# Patient Record
Sex: Male | Born: 1955 | Race: Black or African American | Hispanic: No | Marital: Single | Smoking: Former smoker
Health system: Southern US, Community
[De-identification: ages and names within clinical notes are randomized; demographics above are authoritative.]

## PROBLEM LIST (undated history)

## (undated) DIAGNOSIS — K56609 Unspecified intestinal obstruction, unspecified as to partial versus complete obstruction: Secondary | ICD-10-CM

## (undated) DIAGNOSIS — J939 Pneumothorax, unspecified: Secondary | ICD-10-CM

## (undated) HISTORY — PX: COLOSTOMY: SHX63

---

## 1998-10-08 ENCOUNTER — Emergency Department (HOSPITAL_COMMUNITY): Admission: EM | Admit: 1998-10-08 | Discharge: 1998-10-08 | Payer: Self-pay | Admitting: Internal Medicine

## 2016-04-17 ENCOUNTER — Emergency Department (HOSPITAL_COMMUNITY): Payer: Non-veteran care | Admitting: Anesthesiology

## 2016-04-17 ENCOUNTER — Inpatient Hospital Stay (HOSPITAL_COMMUNITY)
Admission: EM | Admit: 2016-04-17 | Discharge: 2016-04-21 | DRG: 580 | Disposition: A | Discharge status: Home / Self Care | Payer: Non-veteran care | Attending: Orthopedic Surgery | Admitting: Orthopedic Surgery

## 2016-04-17 ENCOUNTER — Emergency Department (HOSPITAL_COMMUNITY): Payer: Non-veteran care

## 2016-04-17 ENCOUNTER — Encounter (HOSPITAL_COMMUNITY): Payer: Self-pay | Admitting: Emergency Medicine

## 2016-04-17 ENCOUNTER — Encounter (HOSPITAL_COMMUNITY)
Admission: EM | Disposition: A | Discharge status: Home / Self Care | Payer: Self-pay | Source: Home / Self Care | Attending: Orthopedic Surgery

## 2016-04-17 DIAGNOSIS — M79641 Pain in right hand: Secondary | ICD-10-CM

## 2016-04-17 DIAGNOSIS — Z87891 Personal history of nicotine dependence: Secondary | ICD-10-CM | POA: Diagnosis not present

## 2016-04-17 DIAGNOSIS — L02511 Cutaneous abscess of right hand: Secondary | ICD-10-CM | POA: Diagnosis present

## 2016-04-17 DIAGNOSIS — IMO0001 Reserved for inherently not codable concepts without codable children: Secondary | ICD-10-CM | POA: Diagnosis present

## 2016-04-17 DIAGNOSIS — M65142 Other infective (teno)synovitis, left hand: Secondary | ICD-10-CM | POA: Diagnosis present

## 2016-04-17 DIAGNOSIS — R21 Rash and other nonspecific skin eruption: Secondary | ICD-10-CM | POA: Diagnosis present

## 2016-04-17 DIAGNOSIS — L03113 Cellulitis of right upper limb: Secondary | ICD-10-CM

## 2016-04-17 HISTORY — PX: I & D EXTREMITY: SHX5045

## 2016-04-17 LAB — COMPREHENSIVE METABOLIC PANEL
ALT: 14 U/L — AB (ref 17–63)
AST: 15 U/L (ref 15–41)
Albumin: 3.8 g/dL (ref 3.5–5.0)
Alkaline Phosphatase: 72 U/L (ref 38–126)
Anion gap: 7 (ref 5–15)
BUN: 15 mg/dL (ref 6–20)
CHLORIDE: 102 mmol/L (ref 101–111)
CO2: 25 mmol/L (ref 22–32)
CREATININE: 1.28 mg/dL — AB (ref 0.61–1.24)
Calcium: 9.1 mg/dL (ref 8.9–10.3)
GFR, EST NON AFRICAN AMERICAN: 58 mL/min — AB (ref >60)
Glucose, Bld: 110 mg/dL — ABNORMAL HIGH (ref 65–99)
Potassium: 4 mmol/L (ref 3.5–5.1)
Sodium: 134 mmol/L — ABNORMAL LOW (ref 135–145)
TOTAL PROTEIN: 7.4 g/dL (ref 6.5–8.1)
Total Bilirubin: 0.5 mg/dL (ref 0.3–1.2)

## 2016-04-17 LAB — CBC WITH DIFFERENTIAL/PLATELET
Basophils Absolute: 0 10*3/uL (ref 0.0–0.1)
Basophils Relative: 0 %
Eosinophils Absolute: 0.1 10*3/uL (ref 0.0–0.7)
Eosinophils Relative: 1 %
HEMATOCRIT: 43.7 % (ref 39.0–52.0)
Hemoglobin: 14.5 g/dL (ref 13.0–17.0)
LYMPHS ABS: 2.2 10*3/uL (ref 0.7–4.0)
Lymphocytes Relative: 16 %
MCH: 31.7 pg (ref 26.0–34.0)
MCHC: 33.2 g/dL (ref 30.0–36.0)
MCV: 95.6 fL (ref 78.0–100.0)
MONO ABS: 1.4 10*3/uL — AB (ref 0.1–1.0)
MONOS PCT: 10 %
Neutro Abs: 9.9 10*3/uL — ABNORMAL HIGH (ref 1.7–7.7)
Neutrophils Relative %: 73 %
PLATELETS: 254 10*3/uL (ref 150–400)
RBC: 4.57 MIL/uL (ref 4.22–5.81)
RDW: 12.2 % (ref 11.5–15.5)
WBC: 13.6 10*3/uL — ABNORMAL HIGH (ref 4.0–10.5)

## 2016-04-17 LAB — SEDIMENTATION RATE: SED RATE: 18 mm/h — AB (ref 0–16)

## 2016-04-17 LAB — C-REACTIVE PROTEIN: CRP: 6.7 mg/dL — AB (ref <1.0)

## 2016-04-17 LAB — I-STAT CG4 LACTIC ACID, ED: LACTIC ACID, VENOUS: 1.13 mmol/L (ref 0.5–1.9)

## 2016-04-17 SURGERY — IRRIGATION AND DEBRIDEMENT EXTREMITY
Anesthesia: General | Site: Hand | Laterality: Right

## 2016-04-17 MED ORDER — SUCCINYLCHOLINE CHLORIDE 20 MG/ML IJ SOLN
INTRAMUSCULAR | Status: DC | PRN
  Administered 2016-04-17: 120 mg via INTRAVENOUS

## 2016-04-17 MED ORDER — DIPHENHYDRAMINE HCL 25 MG PO CAPS
25.0000 mg | ORAL_CAPSULE | Freq: Four times a day (QID) | ORAL | Status: DC | PRN
  Administered 2016-04-19: 25 mg via ORAL
  Administered 2016-04-19 – 2016-04-20 (×2): 50 mg via ORAL
  Administered 2016-04-20 (×2): 25 mg via ORAL
  Filled 2016-04-17: qty 2
  Filled 2016-04-17: qty 1
  Filled 2016-04-17: qty 2
  Filled 2016-04-17 (×2): qty 1

## 2016-04-17 MED ORDER — BISACODYL 10 MG RE SUPP: 10.0000 mg | Freq: Every day | RECTAL | Status: DC | PRN

## 2016-04-17 MED ORDER — ONDANSETRON HCL 4 MG PO TABS: 4.0000 mg | ORAL_TABLET | Freq: Four times a day (QID) | ORAL | Status: DC | PRN

## 2016-04-17 MED ORDER — LIDOCAINE HCL (CARDIAC) 20 MG/ML IV SOLN
INTRAVENOUS | Status: DC | PRN
  Administered 2016-04-17: 100 mg via INTRAVENOUS

## 2016-04-17 MED ORDER — FENTANYL CITRATE (PF) 100 MCG/2ML IJ SOLN
INTRAMUSCULAR | Status: AC
  Filled 2016-04-17: qty 2

## 2016-04-17 MED ORDER — ONDANSETRON HCL 4 MG/2ML IJ SOLN
INTRAMUSCULAR | Status: DC | PRN
  Administered 2016-04-17: 4 mg via INTRAVENOUS

## 2016-04-17 MED ORDER — MIDAZOLAM HCL 2 MG/2ML IJ SOLN
INTRAMUSCULAR | Status: AC
  Filled 2016-04-17: qty 2

## 2016-04-17 MED ORDER — DOCUSATE SODIUM 100 MG PO CAPS
100.0000 mg | ORAL_CAPSULE | Freq: Two times a day (BID) | ORAL | Status: DC
  Administered 2016-04-17 – 2016-04-21 (×7): 100 mg via ORAL
  Filled 2016-04-17 (×7): qty 1

## 2016-04-17 MED ORDER — MORPHINE SULFATE (PF) 4 MG/ML IV SOLN
4.0000 mg | Freq: Once | INTRAVENOUS | Status: AC
  Administered 2016-04-17: 4 mg via INTRAVENOUS
  Filled 2016-04-17: qty 1

## 2016-04-17 MED ORDER — PROMETHAZINE HCL 25 MG RE SUPP: 12.5000 mg | Freq: Four times a day (QID) | RECTAL | Status: DC | PRN

## 2016-04-17 MED ORDER — PIPERACILLIN-TAZOBACTAM 3.375 G IVPB 30 MIN
3.3750 g | Freq: Once | INTRAVENOUS | Status: AC
  Administered 2016-04-17: 3.375 g via INTRAVENOUS
  Filled 2016-04-17: qty 50

## 2016-04-17 MED ORDER — METHOCARBAMOL 1000 MG/10ML IJ SOLN
500.0000 mg | Freq: Four times a day (QID) | INTRAVENOUS | Status: DC | PRN
  Filled 2016-04-17: qty 5

## 2016-04-17 MED ORDER — PROPOFOL 10 MG/ML IV BOLUS
INTRAVENOUS | Status: AC
  Filled 2016-04-17: qty 40

## 2016-04-17 MED ORDER — PROPOFOL 10 MG/ML IV BOLUS
INTRAVENOUS | Status: DC | PRN
  Administered 2016-04-17: 200 mg via INTRAVENOUS
  Administered 2016-04-17: 40 mg via INTRAVENOUS

## 2016-04-17 MED ORDER — POLYETHYLENE GLYCOL 3350 17 G PO PACK
17.0000 g | PACK | Freq: Every day | ORAL | Status: DC | PRN
  Administered 2016-04-19: 17 g via ORAL
  Filled 2016-04-17: qty 1

## 2016-04-17 MED ORDER — BACITRACIN ZINC 500 UNIT/GM EX OINT
TOPICAL_OINTMENT | CUTANEOUS | Status: DC | PRN
  Administered 2016-04-17: 1 via TOPICAL

## 2016-04-17 MED ORDER — VANCOMYCIN HCL 1000 MG IV SOLR
INTRAVENOUS | Status: DC | PRN
  Administered 2016-04-17: 1000 mg via INTRAVENOUS

## 2016-04-17 MED ORDER — HYDROMORPHONE HCL 1 MG/ML IJ SOLN: 0.2500 mg | INTRAMUSCULAR | Status: DC | PRN

## 2016-04-17 MED ORDER — VANCOMYCIN HCL 10 G IV SOLR
1500.0000 mg | Freq: Once | INTRAVENOUS | Status: DC
  Filled 2016-04-17: qty 1500

## 2016-04-17 MED ORDER — BACITRACIN-NEOMYCIN-POLYMYXIN 400-5-5000 EX OINT
TOPICAL_OINTMENT | CUTANEOUS | Status: AC
  Filled 2016-04-17: qty 1

## 2016-04-17 MED ORDER — METHOCARBAMOL 500 MG PO TABS
500.0000 mg | ORAL_TABLET | Freq: Four times a day (QID) | ORAL | Status: DC | PRN
  Administered 2016-04-17 – 2016-04-20 (×3): 500 mg via ORAL
  Filled 2016-04-17 (×3): qty 1

## 2016-04-17 MED ORDER — SODIUM CHLORIDE 0.9 % IR SOLN
Status: DC | PRN
  Administered 2016-04-17: 3000 mL

## 2016-04-17 MED ORDER — SENNA 8.6 MG PO TABS
1.0000 | ORAL_TABLET | Freq: Two times a day (BID) | ORAL | Status: DC
  Administered 2016-04-17 – 2016-04-21 (×7): 8.6 mg via ORAL
  Filled 2016-04-17 (×7): qty 1

## 2016-04-17 MED ORDER — MAGNESIUM CITRATE PO SOLN
1.0000 | Freq: Once | ORAL | Status: AC | PRN
  Administered 2016-04-21: 1 via ORAL
  Filled 2016-04-17: qty 296

## 2016-04-17 MED ORDER — LACTATED RINGERS IV SOLN
INTRAVENOUS | Status: DC | PRN
  Administered 2016-04-17: 21:00:00 via INTRAVENOUS

## 2016-04-17 MED ORDER — POTASSIUM CHLORIDE IN NACL 20-0.45 MEQ/L-% IV SOLN
INTRAVENOUS | Status: DC
  Administered 2016-04-17 – 2016-04-19 (×2): via INTRAVENOUS
  Administered 2016-04-21: 1 mL via INTRAVENOUS
  Filled 2016-04-17 (×6): qty 1000

## 2016-04-17 MED ORDER — 0.9 % SODIUM CHLORIDE (POUR BTL) OPTIME
TOPICAL | Status: DC | PRN
  Administered 2016-04-17: 1000 mL

## 2016-04-17 MED ORDER — FAMOTIDINE 20 MG PO TABS
20.0000 mg | ORAL_TABLET | Freq: Two times a day (BID) | ORAL | Status: DC | PRN
Start: 2016-04-17 — End: 2016-04-21

## 2016-04-17 MED ORDER — BUPIVACAINE HCL (PF) 0.25 % IJ SOLN
INTRAMUSCULAR | Status: AC
  Filled 2016-04-17: qty 30

## 2016-04-17 MED ORDER — MIDAZOLAM HCL 5 MG/5ML IJ SOLN
INTRAMUSCULAR | Status: DC | PRN
  Administered 2016-04-17 (×2): 1 mg via INTRAVENOUS

## 2016-04-17 MED ORDER — VANCOMYCIN HCL IN DEXTROSE 750-5 MG/150ML-% IV SOLN
750.0000 mg | Freq: Two times a day (BID) | INTRAVENOUS | Status: DC
  Administered 2016-04-18 – 2016-04-21 (×6): 750 mg via INTRAVENOUS
  Filled 2016-04-17 (×8): qty 150

## 2016-04-17 MED ORDER — VANCOMYCIN HCL IN DEXTROSE 1-5 GM/200ML-% IV SOLN
INTRAVENOUS | Status: AC
  Filled 2016-04-17: qty 200

## 2016-04-17 MED ORDER — MORPHINE SULFATE (PF) 2 MG/ML IV SOLN: 2.0000 mg | INTRAVENOUS | Status: DC | PRN

## 2016-04-17 MED ORDER — ALPRAZOLAM 0.5 MG PO TABS
0.5000 mg | ORAL_TABLET | Freq: Four times a day (QID) | ORAL | Status: DC | PRN
  Administered 2016-04-19 – 2016-04-20 (×3): 0.5 mg via ORAL
  Filled 2016-04-17 (×3): qty 1

## 2016-04-17 MED ORDER — VITAMIN C 500 MG PO TABS
1000.0000 mg | ORAL_TABLET | Freq: Every day | ORAL | Status: DC
  Administered 2016-04-18 – 2016-04-21 (×4): 1000 mg via ORAL
  Filled 2016-04-17 (×4): qty 2

## 2016-04-17 MED ORDER — OXYCODONE HCL 5 MG PO TABS
5.0000 mg | ORAL_TABLET | ORAL | Status: DC | PRN
  Administered 2016-04-17 – 2016-04-18 (×2): 10 mg via ORAL
  Administered 2016-04-18: 5 mg via ORAL
  Administered 2016-04-19 – 2016-04-20 (×2): 10 mg via ORAL
  Filled 2016-04-17 (×2): qty 2
  Filled 2016-04-17: qty 1
  Filled 2016-04-17: qty 2

## 2016-04-17 MED ORDER — OXYCODONE HCL 5 MG PO TABS
ORAL_TABLET | ORAL | Status: AC
  Filled 2016-04-17: qty 2

## 2016-04-17 MED ORDER — ONDANSETRON HCL 4 MG/2ML IJ SOLN: 4.0000 mg | Freq: Four times a day (QID) | INTRAMUSCULAR | Status: DC | PRN

## 2016-04-17 MED ORDER — PIPERACILLIN-TAZOBACTAM 3.375 G IVPB
3.3750 g | Freq: Three times a day (TID) | INTRAVENOUS | Status: DC
  Administered 2016-04-17 – 2016-04-21 (×11): 3.375 g via INTRAVENOUS
  Filled 2016-04-17 (×14): qty 50

## 2016-04-17 MED ORDER — FENTANYL CITRATE (PF) 100 MCG/2ML IJ SOLN
INTRAMUSCULAR | Status: DC | PRN
  Administered 2016-04-17: 25 ug via INTRAVENOUS
  Administered 2016-04-17: 50 ug via INTRAVENOUS
  Administered 2016-04-17: 25 ug via INTRAVENOUS
  Administered 2016-04-17: 100 ug via INTRAVENOUS

## 2016-04-17 SURGICAL SUPPLY — 49 items
BANDAGE ACE 4X5 VEL STRL LF (GAUZE/BANDAGES/DRESSINGS) ×3 IMPLANT
BANDAGE ELASTIC 4 VELCRO ST LF (GAUZE/BANDAGES/DRESSINGS) ×2 IMPLANT
BNDG CONFORM 2 STRL LF (GAUZE/BANDAGES/DRESSINGS) IMPLANT
BNDG GAUZE ELAST 4 BULKY (GAUZE/BANDAGES/DRESSINGS) ×5 IMPLANT
CORDS BIPOLAR (ELECTRODE) ×5 IMPLANT
CUFF TOURNIQUET SINGLE 18IN (TOURNIQUET CUFF) ×3 IMPLANT
CUFF TOURNIQUET SINGLE 24IN (TOURNIQUET CUFF) IMPLANT
DRESSING ADAPTIC 1/2  N-ADH (PACKING) ×2 IMPLANT
DRSG ADAPTIC 3X8 NADH LF (GAUZE/BANDAGES/DRESSINGS) ×3 IMPLANT
GAUZE SPONGE 4X4 12PLY STRL (GAUZE/BANDAGES/DRESSINGS) ×3 IMPLANT
GAUZE XEROFORM 1X8 LF (GAUZE/BANDAGES/DRESSINGS) ×3 IMPLANT
GLOVE BIOGEL M 8.0 STRL (GLOVE) ×3 IMPLANT
GLOVE BIOGEL PI IND STRL 7.5 (GLOVE) IMPLANT
GLOVE BIOGEL PI INDICATOR 7.5 (GLOVE) ×2
GLOVE SS BIOGEL STRL SZ 8 (GLOVE) ×1 IMPLANT
GLOVE SUPERSENSE BIOGEL SZ 8 (GLOVE) ×2
GLOVE SURG SS PI 7.5 STRL IVOR (GLOVE) ×2 IMPLANT
GOWN STRL REUS W/ TWL LRG LVL3 (GOWN DISPOSABLE) ×1 IMPLANT
GOWN STRL REUS W/ TWL XL LVL3 (GOWN DISPOSABLE) ×2 IMPLANT
GOWN STRL REUS W/TWL LRG LVL3 (GOWN DISPOSABLE) ×3
GOWN STRL REUS W/TWL XL LVL3 (GOWN DISPOSABLE) ×6
HANDPIECE INTERPULSE COAX TIP (DISPOSABLE)
KIT BASIN OR (CUSTOM PROCEDURE TRAY) ×3 IMPLANT
KIT ROOM TURNOVER OR (KITS) ×3 IMPLANT
LOOP VESSEL MAXI BLUE (MISCELLANEOUS) ×2 IMPLANT
MANIFOLD NEPTUNE II (INSTRUMENTS) ×3 IMPLANT
NDL HYPO 25GX1X1/2 BEV (NEEDLE) IMPLANT
NEEDLE HYPO 25GX1X1/2 BEV (NEEDLE) ×3 IMPLANT
NS IRRIG 1000ML POUR BTL (IV SOLUTION) ×3 IMPLANT
PACK ORTHO EXTREMITY (CUSTOM PROCEDURE TRAY) ×3 IMPLANT
PAD ARMBOARD 7.5X6 YLW CONV (MISCELLANEOUS) ×3 IMPLANT
PAD CAST 4YDX4 CTTN HI CHSV (CAST SUPPLIES) ×1 IMPLANT
PADDING CAST COTTON 4X4 STRL (CAST SUPPLIES) ×3
PILLOW ARM CARTER ADULT (MISCELLANEOUS) ×2 IMPLANT
SCRUB POVIDONE IODINE 4 OZ (MISCELLANEOUS) ×2 IMPLANT
SET CYSTO W/LG BORE CLAMP LF (SET/KITS/TRAYS/PACK) ×2 IMPLANT
SET HNDPC FAN SPRY TIP SCT (DISPOSABLE) IMPLANT
SOL PREP POV-IOD 4OZ 10% (MISCELLANEOUS) ×2 IMPLANT
SPONGE GAUZE 4X4 12PLY STER LF (GAUZE/BANDAGES/DRESSINGS) ×4 IMPLANT
SPONGE LAP 4X18 X RAY DECT (DISPOSABLE) ×3 IMPLANT
SWAB CULTURE ESWAB REG 1ML (MISCELLANEOUS) ×2 IMPLANT
SYR CONTROL 10ML LL (SYRINGE) ×2 IMPLANT
TOWEL OR 17X24 6PK STRL BLUE (TOWEL DISPOSABLE) ×3 IMPLANT
TOWEL OR 17X26 10 PK STRL BLUE (TOWEL DISPOSABLE) ×3 IMPLANT
TUBE ANAEROBIC SPECIMEN COL (MISCELLANEOUS) IMPLANT
TUBE CONNECTING 12'X1/4 (SUCTIONS) ×1
TUBE CONNECTING 12X1/4 (SUCTIONS) ×2 IMPLANT
WATER STERILE IRR 1000ML POUR (IV SOLUTION) ×3 IMPLANT
YANKAUER SUCT BULB TIP NO VENT (SUCTIONS) ×5 IMPLANT

## 2016-04-17 NOTE — Transfer of Care (Signed)
Immediate Anesthesia Transfer of Care Note  Patient: Caleb LaundryFrederick Earnest  Procedure(s) Performed: Procedure(s): IRRIGATION AND DEBRIDEMENT RIGHT HAND (Right)  Patient Location: PACU  Anesthesia Type:General  Level of Consciousness: awake and alert   Airway & Oxygen Therapy: Patient Spontanous Breathing and Patient connected to nasal cannula oxygen  Post-op Assessment: Report given to RN and Post -op Vital signs reviewed and stable  Post vital signs: Reviewed and stable  Last Vitals:  Vitals:   04/17/16 1915 04/17/16 1915  BP: 178/100 178/100  Pulse: 67 75  Resp:  16  Temp:      Last Pain:  Vitals:   04/17/16 2021  TempSrc:   PainSc: 0-No pain         Complications: No apparent anesthesia complications

## 2016-04-17 NOTE — Op Note (Signed)
See [id number]#[id number] Amanda PeaGramig MD

## 2016-04-17 NOTE — Anesthesia Procedure Notes (Signed)
Procedure Name: Intubation Date/Time: 04/17/2016 8:57 PM Performed by: Edmonia CaprioAUSTON, Manolo Bosket M Pre-anesthesia Checklist: Patient identified, Emergency Drugs available, Suction available, Timeout performed and Patient being monitored Patient Re-evaluated:Patient Re-evaluated prior to inductionOxygen Delivery Method: Circle system utilized Preoxygenation: Pre-oxygenation with 100% oxygen Intubation Type: IV induction, Rapid sequence and Cricoid Pressure applied Laryngoscope Size: Miller and 2 Grade View: Grade I Tube type: Oral Laser Tube: Cuffed inflated with minimal occlusive pressure - saline Tube size: 7.5 mm Number of attempts: 1 Airway Equipment and Method: Stylet Placement Confirmation: ETT inserted through vocal cords under direct vision,  positive ETCO2 and breath sounds checked- equal and bilateral Secured at: 23 cm Tube secured with: Tape Dental Injury: Teeth and Oropharynx as per pre-operative assessment

## 2016-04-17 NOTE — Progress Notes (Signed)
Received patient from PACU with ace wrap on right hand after I & D.  Patient AOx4, ambulatory, pain at 4/10 throbbing, BP elevated but trending down based from PACU BP data and O2Sat at 98% on 2L/min via [place].  Patient oriented to room, bed controls and call light. Started on a clear diet, patient voided and resting comfortably on bed.

## 2016-04-17 NOTE — ED Notes (Addendum)
Pt. transported to short stay room 36 , personal belongings bagged with pt. at bedside .

## 2016-04-17 NOTE — Anesthesia Preprocedure Evaluation (Addendum)
Anesthesia Evaluation  Patient identified by MRN, date of birth, ID band Patient awake    Reviewed: Allergy & Precautions, NPO status , Patient's Chart, lab work & pertinent test results  Airway Mallampati: I  TM Distance: >3 FB Neck ROM: Full    Dental  (+) Teeth Intact, Dental Advisory Given   Pulmonary neg pulmonary ROS, former smoker,    Pulmonary exam normal        Cardiovascular negative cardio ROS   Rhythm:Regular Rate:Normal     Neuro/Psych    GI/Hepatic Neg liver ROS,   Endo/Other  negative endocrine ROS  Renal/GU negative Renal ROS     Musculoskeletal   Abdominal   Peds  Hematology   Anesthesia Other Findings   Reproductive/Obstetrics                            Anesthesia Physical Anesthesia Plan  ASA: I and emergent  Anesthesia Plan: General   Post-op Pain Management:    Induction: Intravenous, Rapid sequence and Cricoid pressure planned  Airway Management Planned: Oral ETT  Additional Equipment:   Intra-op Plan:   Post-operative Plan: Extubation in OR  Informed Consent: I have reviewed the patients History and Physical, chart, labs and discussed the procedure including the risks, benefits and alternatives for the proposed anesthesia with the patient or authorized representative who has indicated his/her understanding and acceptance.   Dental advisory given  Plan Discussed with: CRNA and Anesthesiologist  Anesthesia Plan Comments:         Anesthesia Quick Evaluation

## 2016-04-17 NOTE — ED Notes (Signed)
Patient transported to X-ray 

## 2016-04-17 NOTE — ED Provider Notes (Signed)
[facility] DEPT Provider Note   CSN: [id number] Arrival date & time: 04/17/16  1419   History   Chief Complaint Chief Complaint  Patient presents with  . Hand Pain  . Rash    HPI Caleb Singh is a 60 y.o. male.  The history is provided by the patient.   60 year old male with no significant past medical history presenting with finger and hand pain. Onset was 2 days ago. Context-patient states he was working in the garden and thinks he may have been bitten by something or injured it, but he does not remember specific event. Pain is worsening since then. Now severe. Located in right middle digit. Extending up the hand. Described as tightness. Associated with swelling, increased warmth. Pain is worse with movement or palpation. Denies associated fevers, weakness, numbness. Right hand dominant. Has a remote nonoperative distal ulnar fracture in the right wrist and a remote right dorsum of hand deep lac injury that was repaired, no other surgeries or prior injuries to affected extremity. No hx of gout, pseudogout, or any similar prior episodes in other joints.    History reviewed. No pertinent past medical history.  Patient Active Problem List   Diagnosis Date Noted  . Abscess of third finger of right hand 04/17/2016    History reviewed. No pertinent surgical history.     Home Medications    Prior to Admission medications   Not on File    Family History History reviewed. No pertinent family history.  Social History Social History  Substance Use Topics  . Smoking status: Former Research scientist (life sciences)  . Smokeless tobacco: Never Used  . Alcohol use No     Allergies   Review of patient's allergies indicates no known allergies.   Review of Systems Review of Systems  Constitutional: Negative for chills and fever.  Respiratory: Negative for shortness of breath.   Cardiovascular: Negative for chest pain.  Gastrointestinal: Negative for abdominal pain and vomiting.    Musculoskeletal: Positive for arthralgias and myalgias.  Skin: Positive for color change and wound.  Allergic/Immunologic: Negative for immunocompromised state.  Neurological: Negative for weakness and numbness.  Hematological: Does not bruise/bleed easily.  All other systems reviewed and are negative.    Physical Exam Updated Vital Signs BP (!) 171/88 (BP Location: Left Arm)   Pulse 71   Temp 100 F (37.8 C) (Oral)   Resp 18   Ht _0  (1.778 m)   Wt 77.1 kg   SpO2 98%   BMI 24.39 kg/m   Physical Exam  Constitutional: He appears well-developed and well-nourished.  HENT:  Head: Normocephalic and atraumatic.  Eyes: Conjunctivae are normal.  Neck: Neck supple.  Cardiovascular: Normal rate and regular rhythm.   No murmur heard. Pulmonary/Chest: Effort normal and breath sounds normal. No respiratory distress.  Abdominal: Soft. There is no tenderness.  Musculoskeletal: He exhibits edema and tenderness.  R middle digit held slightly in flexion, with TTP throughout digit. Small possible soft tissue wound vs superficial scab to dorsum of digit. Edema and erythema throughout digit and extending up dorsum of hand, without clear wrist involvement and no large wrist effusion. Small area of fluctuance within the digit. Brisk cap refill, intact flex and ext at MCP / PIP / DIP. 2+ radial pulse.   Neurological: He is alert.  Skin: Skin is warm and dry.  Psychiatric: He has a normal mood and affect.  Nursing note and vitals reviewed.    ED Treatments / Results  Labs (all labs ordered are  listed, but only abnormal results are displayed) Labs Reviewed  COMPREHENSIVE METABOLIC PANEL - Abnormal; Notable for the following:       Result Value   Sodium 134 (*)    Glucose, Bld 110 (*)    Creatinine, Ser 1.28 (*)    ALT 14 (*)    GFR calc non Af Amer 58 (*)    All other components within normal limits  CBC WITH DIFFERENTIAL/PLATELET - Abnormal; Notable for the following:    WBC 13.6  (*)    Neutro Abs 9.9 (*)    Monocytes Absolute 1.4 (*)    All other components within normal limits  SEDIMENTATION RATE - Abnormal; Notable for the following:    Sed Rate 18 (*)    All other components within normal limits  C-REACTIVE PROTEIN - Abnormal; Notable for the following:    CRP 6.7 (*)    All other components within normal limits  CULTURE, BLOOD (ROUTINE X 2)  URINE CULTURE  AEROBIC/ANAEROBIC CULTURE (SURGICAL/DEEP WOUND)  URINALYSIS, ROUTINE W REFLEX MICROSCOPIC (NOT AT [facility])  CBC  BASIC METABOLIC PANEL  I-STAT CG4 LACTIC ACID, ED    EKG  EKG Interpretation None       Radiology Dg Hand Complete Right  Result Date: 04/17/2016 CLINICAL DATA:  60 y/o M; pain beginning at the third proximal interphalangeal joint now spread to the entire hand with swelling, especially the middle finger. Small bumps on distal forearm patient believes is poison ivy. EXAM: RIGHT HAND - COMPLETE 3+ VIEW COMPARISON:  None. FINDINGS: Chronic ulnar styloid fracture. Degenerative changes of the radiocarpal joint with joint space narrowing and sclerosis of articular surfaces. Osteoarthrosis of multiple interphalangeal joints most pronounced in the fifth distal interphalangeal joint and at the first carpometacarpal joint. No acute fracture or dislocation is identified. No suspicious osseous lesion. Mild soft tissue swelling of the hand. IMPRESSION: No acute bony or articular abnormality is identified. 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   On: 04/17/2016 19:57    Procedures Procedures (including critical care time)  Medications Ordered in ED Medications  vancomycin (VANCOCIN) 1-5 GM/200ML-% IVPB (not administered)  0.45 % NaCl with KCl 20 mEq / L infusion ( Intravenous New Bag/Given 04/17/16 2311)   oxyCODONE (Oxy IR/ROXICODONE) immediate release tablet 5-10 mg (10 mg Oral Given 04/17/16 2204)  morphine 2 MG/ML injection 2-4 mg (not administered)  methocarbamol (ROBAXIN) tablet 500 mg (500 mg Oral Given 04/17/16 2238)    Or  methocarbamol (ROBAXIN) 500 mg in dextrose 5 % 50 mL IVPB ( Intravenous See Alternative 04/17/16 2238)  diphenhydrAMINE (BENADRYL) capsule 25-50 mg (not administered)  docusate sodium (COLACE) capsule 100 mg (100 mg Oral Given 04/17/16 2312)  senna (SENOKOT) tablet 8.6 mg (8.6 mg Oral Given 04/17/16 2312)  polyethylene glycol (MIRALAX / GLYCOLAX) packet 17 g (not administered)  bisacodyl (DULCOLAX) suppository 10 mg (not administered)  magnesium citrate solution 1 Bottle (not administered)  ondansetron (ZOFRAN) tablet 4 mg (not administered)    Or  ondansetron (ZOFRAN) injection 4 mg (not administered)  promethazine (PHENERGAN) suppository 12.5 mg (not administered)  famotidine (PEPCID) tablet 20 mg (not administered)  vitamin C (ASCORBIC ACID) tablet 1,000 mg (not administered)  ALPRAZolam (XANAX) tablet 0.5 mg (not administered)  oxyCODONE (Oxy IR/ROXICODONE) 5 MG immediate release tablet (not administered)  vancomycin (VANCOCIN) IVPB 750 mg/150 ml premix (not administered)  piperacillin-tazobactam (  ZOSYN) IVPB 3.375 g (3.375 g Intravenous Given 04/17/16 2312)  morphine 4 MG/ML injection 4 mg (4 mg Intravenous Given 04/17/16 1920)  piperacillin-tazobactam (ZOSYN) IVPB 3.375 g (0 g Intravenous Stopped 04/17/16 2014)     Initial Impression / Assessment and Plan / ED Course  I have reviewed the triage vital signs and the nursing notes.  Pertinent labs & imaging results that were available during my care of the patient were reviewed by me and considered in my medical decision making (see chart for details).  Clinical Course    60 year old male with no significant past medical history presenting with finger and hand pain as above. Labs notable for leukocytosis, very  mild elevation in ESR/CRP. X-ray negative for bony involvement or foreign body. Presentation is concerning for soft tissue infection, with possible deep space infection of hand/abscess or possible FTS. Blood cultures obtained and pt started on broad spectrum abx coverage with Vanc/Zosyn. Arm elevator ordered. Hand Surgery consulted. Taken to OR for I&D in stable condition.   Case discussed with Dr. Wyvonnia Dusky, who oversaw management of this patient.   Final Clinical Impressions(s) / ED Diagnoses   Final diagnoses:  Right hand pain  Cellulitis of right upper extremity    New Prescriptions There are no discharge medications for this patient.    Ivin Booty, MD 04/18/16 Laureen Abrahams    Ezequiel Essex, MD 04/18/16 [phone]

## 2016-04-17 NOTE — H&P (Signed)
Caleb Singh is an 60 y.o. male.   Chief Complaint: Right hand and middle finger pain HPI: Patient began experiencing right hand and middle finger pain after working aggressively in his yard 2 days ago on Sunday. He presents to the emergency room after he was seen at the the a clinic and referred to [facility]. He was told that his problems were more complex and the [place] clinic could handle and it was recommended he moves his treatment in emergency room setting  I was called to see him acutely and promptly came and saw the patient. He has an infected right hand and middle finger. He has a high white count, pain and difficulty moving the finger secondary to pain.   He does not recall a obvious inoculation but states that he has possibly injured it with the yard work  He denies prior history of injury. He denies gout pseudogout or inflammatory arthritide's.  History reviewed. No pertinent past medical history.  History reviewed. No pertinent surgical history.  No family history on file. Social History:  reports that he has quit smoking. He has never used smokeless tobacco. He reports that he does not drink alcohol or use drugs.  Allergies: No Known Allergies   (Not in a hospital admission)  Results for orders placed or performed during the hospital encounter of 04/17/16 (from the past 48 hour(s))  Comprehensive metabolic panel     Status: Abnormal   Collection Time: 04/17/16  2:58 AM  Result Value Ref Range   Sodium 134 (L) 135 - 145 mmol/L   Potassium 4.0 3.5 - 5.1 mmol/L   Chloride 102 101 - 111 mmol/L   CO2 25 22 - 32 mmol/L   Glucose, Bld 110 (H) 65 - 99 mg/dL   BUN 15 6 - 20 mg/dL   Creatinine, Ser 1.28 (H) 0.61 - 1.24 mg/dL   Calcium 9.1 8.9 - 10.3 mg/dL   Total Protein 7.4 6.5 - 8.1 g/dL   Albumin 3.8 3.5 - 5.0 g/dL   AST 15 15 - 41 U/L   ALT 14 (L) 17 - 63 U/L   Alkaline Phosphatase 72 38 - 126 U/L   Total Bilirubin 0.5 0.3 - 1.2 mg/dL   GFR calc non Af Amer 58 (L) >60  mL/min   GFR calc Af Amer >60 >60 mL/min    Comment: (NOTE) The eGFR has been calculated using the CKD EPI equation. This calculation has not been validated in all clinical situations. eGFR's persistently <60 mL/min signify possible Chronic Kidney Disease.    Anion gap 7 5 - 15  CBC with Differential     Status: Abnormal   Collection Time: 04/17/16  2:58 AM  Result Value Ref Range   WBC 13.6 (H) 4.0 - 10.5 K/uL   RBC 4.57 4.22 - 5.81 MIL/uL   Hemoglobin 14.5 13.0 - 17.0 g/dL   HCT 43.7 39.0 - 52.0 %   MCV 95.6 78.0 - 100.0 fL   MCH 31.7 26.0 - 34.0 pg   MCHC 33.2 30.0 - 36.0 g/dL   RDW 12.2 11.5 - 15.5 %   Platelets 254 150 - 400 K/uL   Neutrophils Relative % 73 %   Lymphocytes Relative 16 %   Monocytes Relative 10 %   Eosinophils Relative 1 %   Basophils Relative 0 %   Neutro Abs 9.9 (H) 1.7 - 7.7 K/uL   Lymphs Abs 2.2 0.7 - 4.0 K/uL   Monocytes Absolute 1.4 (H) 0.1 - 1.0 K/uL  Eosinophils Absolute 0.1 0.0 - 0.7 K/uL   Basophils Absolute 0.0 0.0 - 0.1 K/uL   WBC Morphology ATYPICAL LYMPHOCYTES   I-Stat CG4 Lactic Acid, ED     Status: None   Collection Time: 04/17/16  3:10 PM  Result Value Ref Range   Lactic Acid, Venous 1.13 0.5 - 1.9 mmol/L   Dg Hand Complete Right  Result Date: 04/17/2016 CLINICAL DATA:  60 y/o M; pain beginning at the third proximal interphalangeal joint now spread to the entire hand with swelling, especially the middle finger. Small bumps on distal forearm patient believes is poison ivy. EXAM: RIGHT HAND - COMPLETE 3+ VIEW COMPARISON:  None. FINDINGS: Chronic ulnar styloid fracture. Degenerative changes of the radiocarpal joint with joint space narrowing and sclerosis of articular surfaces. Osteoarthrosis of multiple interphalangeal joints most pronounced in the fifth distal interphalangeal joint and at the first carpometacarpal joint. No acute fracture or dislocation is identified. No suspicious osseous lesion. Mild soft tissue swelling of the hand.  IMPRESSION: No acute bony or articular abnormality is identified. 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   On: 04/17/2016 19:57    Review of Systems  Respiratory: Negative.   Gastrointestinal: Negative.   Genitourinary: Negative.   Endo/Heme/Allergies: Negative.     Blood pressure 178/100, pulse 75, temperature 98.6 F (37 C), resp. rate 16, height '5\' 10"'$  (1.778 m), weight 77.1 kg (170 lb), SpO2 100 %. Physical Exam black male alert and oriented in no acute distress. He is appropriate. Middle finger has erythema and fluctuance over the dorsal DIP  region and pain with palpation  He does exhibit some mild erythema volarly but is not impressively tender over the flexor tendon. Nevertheless with the significant amount of cellulitis up to the mid forearm region is difficult to ascertain if he could have a small flexor component to this injury.  The patient is alert and oriented in no acute distress. The patient complains of pain in the affected upper extremity.  The patient is noted to have a normal HEENT exam. Lung fields show equal chest expansion and no shortness of breath. Abdomen exam is nontender without distention. Lower extremity examination does not show any fracture dislocation or blood clot symptoms. Pelvis is stable and the neck and back are stable and nontender.       Assessment/Plan Swelling cellulitis and what appears to be a infectious process about the middle finger and hand. I would recommend an exploratory irrigation debridement. I will plan for dorsal incision due to the fluctuance and possibly a volar  incision if necessary given the interoperative findings. The graphite discussed with him recommendations of admission, IV antibiotics and other measures. He was concerned  about the [place] approval issues. I discussed with him that given the significant infectious issues I would certainly move forward in a urgent fashion.  We are planning surgery for your upper extremity. The risk and benefits of surgery to include risk of bleeding, infection, anesthesia,  damage to normal structures and failure of the surgery to accomplish its intended goals of relieving symptoms and restoring function have been discussed in detail. With this in mind we plan to proceed. I have specifically discussed with the patient the pre-and postoperative regime and the dos and don'ts and risk and benefits in great detail. Risk and benefits of surgery also include risk of dystrophy(CRPS), chronic nerve pain,  failure of the healing process to go onto completion and other inherent risks of surgery The relavent the pathophysiology of the disease/injury process, as well as the alternatives for treatment and postoperative course of action has been discussed in great detail with the patient who desires to proceed.  We will do everything in our power to help you (the patient) restore function to the upper extremity. It is a pleasure to see this patient today.  Paulene Floor, MD 04/17/2016, 8:16 PM

## 2016-04-17 NOTE — ED Triage Notes (Signed)
Onset one day ago rash left wrist and left middle finger  Rash and swelling increasing in size today. +2 pulse states pain tightness 7/10.

## 2016-04-17 NOTE — Progress Notes (Signed)
Pharmacy Antibiotic Note  Caleb Singh is a 60 y.o. male admitted on 04/17/2016 with hand infection s/p surgical I&D. Patient received Vancomycin 1gm pre-op today at 2110.  Pharmacy has been consulted for Vancomycin and Zosyn dosing post-op.  Plan: Vancomycin 750 IV every 12 hours.  Goal trough 10-15 mcg/mL. Next dose due 8/30 at 0900. Zosyn 3.375 gm IV q8h (4 hour infusion).   Height: 5\' 10"  (177.8 cm) Weight: 170 lb (77.1 kg) IBW/kg (Calculated) : 73  Temp (24hrs), Avg:99.5 F (37.5 C), Min:98.6 F (37 C), Max:100 F (37.8 C)   Recent Labs Lab 04/17/16 0258 04/17/16 1510  WBC 13.6*  --   CREATININE 1.28*  --   LATICACIDVEN  --  1.13    Estimated Creatinine Clearance: 61.8 mL/min (by C-G formula based on SCr of 1.28 mg/dL).    No Known Allergies  Antimicrobials this admission: Vanc 8/29 >> Zosyn 8/29 >>  Dose adjustments this admission:   Microbiology results: 8/29 blood cx pending  Thank you for allowing pharmacy to be a part of this patient's care.  Toys 'R' UsKimberly Rai Sinagra, Pharm.D., BCPS Clinical Pharmacist Pager [phone] 04/17/2016 10:42 PM

## 2016-04-18 ENCOUNTER — Encounter (HOSPITAL_COMMUNITY): Payer: Self-pay | Admitting: Orthopedic Surgery

## 2016-04-18 LAB — BASIC METABOLIC PANEL
ANION GAP: 8 (ref 5–15)
BUN: 7 mg/dL (ref 6–20)
CALCIUM: 8.6 mg/dL — AB (ref 8.9–10.3)
CHLORIDE: 103 mmol/L (ref 101–111)
CO2: 28 mmol/L (ref 22–32)
Creatinine, Ser: 0.99 mg/dL (ref 0.61–1.24)
GFR calc Af Amer: >60 mL/min (ref >60)
GFR calc non Af Amer: >60 mL/min (ref >60)
Glucose, Bld: 107 mg/dL — ABNORMAL HIGH (ref 65–99)
POTASSIUM: 3.9 mmol/L (ref 3.5–5.1)
SODIUM: 139 mmol/L (ref 135–145)

## 2016-04-18 LAB — CBC
HCT: 39.5 % (ref 39.0–52.0)
Hemoglobin: 12.9 g/dL — ABNORMAL LOW (ref 13.0–17.0)
MCH: 31.3 pg (ref 26.0–34.0)
MCHC: 32.7 g/dL (ref 30.0–36.0)
MCV: 95.9 fL (ref 78.0–100.0)
PLATELETS: 220 10*3/uL (ref 150–400)
RBC: 4.12 MIL/uL — AB (ref 4.22–5.81)
RDW: 12.3 % (ref 11.5–15.5)
WBC: 12.7 10*3/uL — AB (ref 4.0–10.5)

## 2016-04-18 LAB — SURGICAL PCR SCREEN
MRSA, PCR: NEGATIVE
Staphylococcus aureus: NEGATIVE

## 2016-04-18 NOTE — Progress Notes (Signed)
Orthopedic Tech Progress Note Patient Details:  Caleb Singh [birth year] [id number]  Ortho Devices Type of Ortho Device: Arm sling Ortho Device/Splint Location: RUE Ortho Device/Splint Interventions: Casandra DoffingOrdered   [name] [name] 04/18/2016, 7:52 PM

## 2016-04-18 NOTE — Op Note (Signed)
NAMMarland Kitchen:  Norris CrossBENTON, Aahil            ACCOUNT NO.:  [id number]  MEDICAL RECORD NO.:  [id number]  LOCATION:  [id number]                        FACILITY:  [facility]  PHYSICIAN:  Dionne AnoWilliam M. Eldridge Marcott, M.D.DATE OF BIRTH:  [birth year]  DATE OF PROCEDURE: DATE OF DISCHARGE:                              OPERATIVE REPORT   PREOPERATIVE DIAGNOSIS:  Right hand middle finger abscess.  POSTOPERATIVE DIAGNOSIS:  Right hand middle finger abscess with purulent tenosynovitis about the extensor apparatus and proximal interphalangeal joint.  SURGICAL PROCEDURE: 1. Irrigation and debridement of deep abscess, right hand and middle     finger. 2. Radical extensor tenolysis, tenosynovectomy secondary to purulent     tenosynovitis about the extensor apparatus, right middle finger. 3. Right middle finger PIP (proximal interphalangeal joint) arthrotomy     synovectomy secondary to prior wounds.  SURGEON:  Dionne AnoWilliam M. Amanda PeaGramig, MD  ASSISTANT:  None.  COMPLICATIONS:  None.  ANESTHESIA:  General.  TOURNIQUET TIME:  Less than an hour.  CULTURES:  Aerobic and anaerobic taken.  INDICATIONS:  A 60 year old male with history of swelling, pain, inability to move the finger, and obvious infection.  He presents for I and D.  He understands risks and benefits.  His story is little interesting and unusual, but nevertheless he certainly has evidence of advanced infectious process.  I have discussed with him risks and benefits of surgery and he desires to proceed.  OPERATION IN DETAIL:  The patient was seen by myself and Anesthesia, taken to the operative theater and underwent smooth induction of anesthesia.  Time-out was called.  He was prepped and draped, Betadine scrub and paint.  Arm was elevated, tourniquet was insufflated.  Mid lateral incision was made about the ulnar aspect of the middle finger. Dissection was carried down.  Immediate infection was noted about the area underneath the extensor apparatus and tracking  into the PIP joint. I very carefully performed decompression of the abscess, this was I and D of a deep abscess, right middle finger and hand.  Following this, I performed a radical extensor tenolysis, tenosynovectomy of the infection tracked underneath the extensor apparatus.  I tracked this and made a separate incision at the dorsal aspect of the hand.  There were 2 separate incisions, 1 ulnarly about the mid lateral line of the middle finger and 1 in the hand dorsally was created.  The patient had marked attenuation of the extensor apparatus and devitalized tissue.  This was sharply excised.  Penrose/vascular type drain was placed to connect 2 incisions and performed debridement and radical tenolysis, tenosynovectomy.  Following this, I then opened the PIP joint where there was also abscess and performed a radical arthrotomy, synovectomy trying to preserve cartilage.  I then very carefully and cautiously explored the deep aspect where the flexor apparatus was located.  The flexor apparatus was located and noted to have no evidence of thick fluid in the area.  I felt this was likely confined to the PIP and the extensor apparatus.  I then irrigated with 4 L of saline through cysto-tubing.  Following this, I then left the wound open for later closure after 2nd I and D, given the severe nature of the infection.  Tourniquet was  deflated during the irrigation process.  Hemostasis secured.  Wound was attended to with Adaptic, Xeroform, and bacitracin followed by bulky gauze, Kerlix, and a volar plaster/fiberglass splint. He was taken to recovery room in stable condition.  All sponge, needle, and instrument counts reported as correct.  We will do our best to eradicate his infection, however this is very severe infection and an unusual presentation in my opinion.  We will do everything in our power to try to help this gentleman.  These notes have been discussed and all questions have been  encouraged and answered.     Dionne Ano. Amanda Pea, M.D.     [facility]  D:  04/17/2016  T:  04/18/2016  Job:  [id number]

## 2016-04-18 NOTE — Progress Notes (Signed)
0900 Red rashes noted to pt's chest this morning, Mr Caleb Singh ortho PA was aware, will cont to monitor.  1300 No new rashes notes after Vanco was given. Pt denies itching.

## 2016-04-18 NOTE — Progress Notes (Signed)
Subjective: 1 Day Post-Op Procedure(s) (LRB): IRRIGATION AND DEBRIDEMENT RIGHT HAND (Right) Patient reports pain as minimal.  He is tolerating a regular diet and voiding without difficulties. He denies fever or chills, nausea or vomiting at this juncture. He states he did notice a rash developing about his chest this morning. He states it does not bother him at this juncture and does not "itch. He denies any shortness of breath, chest pain or other constitutional symptoms at this juncture The patient has received Zosyn and vancomycin last night and currently has Zosyn running in the IV at this juncture. Objective: Vital signs in last 24 hours: Temp:  [98.6 F (37 C)-100 F (37.8 C)] 99.2 F (37.3 C) (08/30 0518) Pulse Rate:  [67-80] 70 (08/30 0518) Resp:  [16-22] 18 (08/29 2215) BP: (142-179)/(74-100) 142/74 (08/30 0518) SpO2:  [98 %-100 %] 98 % (08/30 0518) Weight:  [77.1 kg (170 lb)] 77.1 kg (170 lb) (08/29 1915)  Intake/Output from previous day: 08/29 0701 - 08/30 0700 In: 1560 [P.O.:330; I.V.:1180; IV Piggyback:50] Out: 1785 [Urine:1775; Blood:10] Intake/Output this shift: No intake/output data recorded.   Recent Labs  04/17/16 0258 04/18/16 0355  HGB 14.5 12.9*    Recent Labs  04/17/16 0258 04/18/16 0355  WBC 13.6* 12.7*  RBC 4.57 4.12*  HCT 43.7 39.5  PLT 254 220    Recent Labs  04/17/16 0258 04/18/16 0355  NA 134* 139  K 4.0 3.9  CL 102 103  CO2 25 28  BUN 15 7  CREATININE 1.28* 0.99  GLUCOSE 110* 107*  CALCIUM 9.1 8.6*   Results for orders placed or performed during the hospital encounter of 04/17/16  Aerobic/Anaerobic Culture (surgical/deep wound)     Status: None (Preliminary result)   Collection Time: 04/17/16  9:09 PM  Result Value Ref Range Status   Specimen Description ABSCESS  Final   Special Requests RIGHT MIDDLE FINGER  Final   Gram Stain   Final    MODERATE WBC PRESENT,BOTH PMN AND MONONUCLEAR NO ORGANISMS SEEN    Culture PENDING   Incomplete   Report Status PENDING  Incomplete   No results for input(s): LABPT, INR in the last 72 hours.  He is pleasant, in no acute distress, conversant HEENT: Atraumatic normocephalic Chest: Patient has a non-pruritic maculopapular rash about the anterior chest, no involvement of the extremities upper or lower or about the back at this juncture Evaluation of the right hand shows that his dressings dorsally have some degree of saturation the dressings are reinforced and a new wrap is applied. He has no signs of ascending saline's about the forearm or upper arm Assessment/Plan: 1 Day Post-Op Procedure(s) (LRB): IRRIGATION AND DEBRIDEMENT RIGHT HAND (Right) We have discussed with him the serious nature of his infection and our recommendations to proceed to the operative suite tomorrow for repeat irrigation and debridement. He will need to be nothing by mouth after midnight tonight. We have discussed with him keeping a close watch on his current antibiotic regime. I have discussed with the nursing staff is observation. He is going to complete the current dose of Zosyn if he has any worsening the nursing staff will notify me. In addition once the vancomycin is given if there are any increased amount of symptoms we will be notified. All questions were encouraged and answered.We have discussed with the patient the issues regarding their infection to the extremity. We will continue antibiotics and await culture results. Often times it will take 3-5 days for cultures to become  final. During this time we will typically have the patient on intravenous antibiotics until we can find a parenteral route of antibiotic regime specific for the bacteria or organism isolated. We have discussed with the patient the need for daily irrigation and debridement as well as therapy to the area. We have discussed with the patient the necessity of range of motion to the involved joints as discussed today. We have discussed with  the patient the unpredictability of infections at times. We'll continue to work towards good pain control and restoration of function. The patient understands the need for meticulous wound care and the necessity of proper followup.  The possible complications of stiffness (loss of motion), resistant infection, possible deep bone infection, possible chronic pain issues, possible need for multiple surgeries and even amputation.  With this in mind the patient understands our goal is to eradicate the infection to quiesence. We will continue to work towards these goals. 04/18/2016, 9:02 AM

## 2016-04-19 ENCOUNTER — Encounter (HOSPITAL_COMMUNITY)
Admission: EM | Disposition: A | Discharge status: Home / Self Care | Payer: Self-pay | Source: Home / Self Care | Attending: Orthopedic Surgery

## 2016-04-19 ENCOUNTER — Encounter (HOSPITAL_COMMUNITY): Payer: Self-pay | Admitting: Certified Registered Nurse Anesthetist

## 2016-04-19 ENCOUNTER — Inpatient Hospital Stay (HOSPITAL_COMMUNITY): Payer: Non-veteran care | Admitting: Certified Registered Nurse Anesthetist

## 2016-04-19 HISTORY — PX: I & D EXTREMITY: SHX5045

## 2016-04-19 LAB — CBC
HCT: 40.1 % (ref 39.0–52.0)
HEMOGLOBIN: 12.9 g/dL — AB (ref 13.0–17.0)
MCH: 31.4 pg (ref 26.0–34.0)
MCHC: 32.2 g/dL (ref 30.0–36.0)
MCV: 97.6 fL (ref 78.0–100.0)
PLATELETS: 227 10*3/uL (ref 150–400)
RBC: 4.11 MIL/uL — AB (ref 4.22–5.81)
RDW: 12 % (ref 11.5–15.5)
WBC: 8.7 10*3/uL (ref 4.0–10.5)

## 2016-04-19 SURGERY — IRRIGATION AND DEBRIDEMENT EXTREMITY
Anesthesia: General | Site: Hand | Laterality: Right

## 2016-04-19 MED ORDER — MIDAZOLAM HCL 2 MG/2ML IJ SOLN
INTRAMUSCULAR | Status: AC
  Filled 2016-04-19: qty 2

## 2016-04-19 MED ORDER — PROPOFOL 10 MG/ML IV BOLUS
INTRAVENOUS | Status: AC
  Filled 2016-04-19: qty 20

## 2016-04-19 MED ORDER — LIDOCAINE HCL (CARDIAC) 20 MG/ML IV SOLN
INTRAVENOUS | Status: DC | PRN
  Administered 2016-04-19: 60 mg via INTRAVENOUS

## 2016-04-19 MED ORDER — POVIDONE-IODINE 10 % EX SWAB: 2.0000 "application " | Freq: Once | CUTANEOUS | Status: DC

## 2016-04-19 MED ORDER — FENTANYL CITRATE (PF) 100 MCG/2ML IJ SOLN
INTRAMUSCULAR | Status: AC
  Filled 2016-04-19: qty 2

## 2016-04-19 MED ORDER — CHLORHEXIDINE GLUCONATE 4 % EX LIQD: 60.0000 mL | Freq: Once | CUTANEOUS | Status: DC

## 2016-04-19 MED ORDER — FENTANYL CITRATE (PF) 100 MCG/2ML IJ SOLN
INTRAMUSCULAR | Status: DC | PRN
  Administered 2016-04-19 (×5): 25 ug via INTRAVENOUS

## 2016-04-19 MED ORDER — OXYCODONE HCL 5 MG PO TABS
5.0000 mg | ORAL_TABLET | Freq: Once | ORAL | Status: AC | PRN
  Administered 2016-04-19: 5 mg via ORAL

## 2016-04-19 MED ORDER — HYDROMORPHONE HCL 1 MG/ML IJ SOLN
0.2500 mg | INTRAMUSCULAR | Status: DC | PRN
  Administered 2016-04-19 (×2): 0.5 mg via INTRAVENOUS

## 2016-04-19 MED ORDER — LACTATED RINGERS IV SOLN
INTRAVENOUS | Status: DC | PRN
  Administered 2016-04-19: 20:00:00 via INTRAVENOUS

## 2016-04-19 MED ORDER — ONDANSETRON HCL 4 MG/2ML IJ SOLN
INTRAMUSCULAR | Status: AC
  Filled 2016-04-19: qty 2

## 2016-04-19 MED ORDER — BACITRACIN ZINC 500 UNIT/GM EX OINT
TOPICAL_OINTMENT | CUTANEOUS | Status: AC
  Filled 2016-04-19: qty 28.35

## 2016-04-19 MED ORDER — ACETAMINOPHEN 160 MG/5ML PO SOLN: 325.0000 mg | ORAL | Status: DC | PRN

## 2016-04-19 MED ORDER — MIDAZOLAM HCL 2 MG/2ML IJ SOLN
INTRAMUSCULAR | Status: DC | PRN
  Administered 2016-04-19: 2 mg via INTRAVENOUS

## 2016-04-19 MED ORDER — ACETAMINOPHEN 325 MG PO TABS: 325.0000 mg | ORAL_TABLET | ORAL | Status: DC | PRN

## 2016-04-19 MED ORDER — HYDROMORPHONE HCL 1 MG/ML IJ SOLN
INTRAMUSCULAR | Status: AC
  Filled 2016-04-19: qty 1

## 2016-04-19 MED ORDER — ONDANSETRON HCL 4 MG/2ML IJ SOLN
INTRAMUSCULAR | Status: DC | PRN
  Administered 2016-04-19: 4 mg via INTRAVENOUS

## 2016-04-19 MED ORDER — 0.9 % SODIUM CHLORIDE (POUR BTL) OPTIME
TOPICAL | Status: DC | PRN
  Administered 2016-04-19: 1000 mL

## 2016-04-19 MED ORDER — OXYCODONE HCL 5 MG PO TABS
ORAL_TABLET | ORAL | Status: AC
  Filled 2016-04-19: qty 1

## 2016-04-19 MED ORDER — SODIUM CHLORIDE 0.9 % IR SOLN
Status: DC | PRN
  Administered 2016-04-19: 3000 mL

## 2016-04-19 MED ORDER — OXYCODONE HCL 5 MG/5ML PO SOLN: 5.0000 mg | Freq: Once | ORAL | Status: AC | PRN

## 2016-04-19 MED ORDER — PROPOFOL 10 MG/ML IV BOLUS
INTRAVENOUS | Status: DC | PRN
  Administered 2016-04-19: 200 mg via INTRAVENOUS

## 2016-04-19 SURGICAL SUPPLY — 45 items
BANDAGE ACE 3X5.8 VEL STRL LF (GAUZE/BANDAGES/DRESSINGS) ×2 IMPLANT
BANDAGE ACE 4X5 VEL STRL LF (GAUZE/BANDAGES/DRESSINGS) ×3 IMPLANT
BNDG CONFORM 2 STRL LF (GAUZE/BANDAGES/DRESSINGS) IMPLANT
BNDG GAUZE ELAST 4 BULKY (GAUZE/BANDAGES/DRESSINGS) ×7 IMPLANT
CORDS BIPOLAR (ELECTRODE) ×3 IMPLANT
CUFF TOURNIQUET SINGLE 18IN (TOURNIQUET CUFF) ×3 IMPLANT
CUFF TOURNIQUET SINGLE 24IN (TOURNIQUET CUFF) IMPLANT
DRSG ADAPTIC 3X8 NADH LF (GAUZE/BANDAGES/DRESSINGS) ×3 IMPLANT
GAUZE SPONGE 4X4 12PLY STRL (GAUZE/BANDAGES/DRESSINGS) ×3 IMPLANT
GAUZE XEROFORM 1X8 LF (GAUZE/BANDAGES/DRESSINGS) ×1 IMPLANT
GAUZE XEROFORM 5X9 LF (GAUZE/BANDAGES/DRESSINGS) ×2 IMPLANT
GLOVE BIOGEL M 8.0 STRL (GLOVE) ×1 IMPLANT
GLOVE SS BIOGEL STRL SZ 8 (GLOVE) ×1 IMPLANT
GLOVE SUPERSENSE BIOGEL SZ 8 (GLOVE) ×2
GOWN STRL REUS W/ TWL LRG LVL3 (GOWN DISPOSABLE) ×1 IMPLANT
GOWN STRL REUS W/ TWL XL LVL3 (GOWN DISPOSABLE) ×2 IMPLANT
GOWN STRL REUS W/TWL LRG LVL3 (GOWN DISPOSABLE) ×3
GOWN STRL REUS W/TWL XL LVL3 (GOWN DISPOSABLE) ×6
HANDPIECE INTERPULSE COAX TIP (DISPOSABLE)
KIT BASIN OR (CUSTOM PROCEDURE TRAY) ×3 IMPLANT
KIT ROOM TURNOVER OR (KITS) ×3 IMPLANT
LOOP VESSEL MAXI BLUE (MISCELLANEOUS) ×2 IMPLANT
MANIFOLD NEPTUNE II (INSTRUMENTS) ×3 IMPLANT
NDL HYPO 25GX1X1/2 BEV (NEEDLE) IMPLANT
NEEDLE HYPO 25GX1X1/2 BEV (NEEDLE) IMPLANT
NS IRRIG 1000ML POUR BTL (IV SOLUTION) ×3 IMPLANT
PACK ORTHO EXTREMITY (CUSTOM PROCEDURE TRAY) ×3 IMPLANT
PAD ARMBOARD 7.5X6 YLW CONV (MISCELLANEOUS) ×3 IMPLANT
PAD CAST 3X4 CTTN HI CHSV (CAST SUPPLIES) IMPLANT
PAD CAST 4YDX4 CTTN HI CHSV (CAST SUPPLIES) ×1 IMPLANT
PADDING CAST COTTON 3X4 STRL (CAST SUPPLIES) ×3
PADDING CAST COTTON 4X4 STRL (CAST SUPPLIES) ×3
SET HNDPC FAN SPRY TIP SCT (DISPOSABLE) IMPLANT
SPLINT FIBERGLASS 3X12 (CAST SUPPLIES) ×2 IMPLANT
SPONGE LAP 4X18 X RAY DECT (DISPOSABLE) ×1 IMPLANT
SUT PROLENE 3 0 PS 1 (SUTURE) ×2 IMPLANT
SUT PROLENE 4 0 PS 2 18 (SUTURE) ×2 IMPLANT
SYR CONTROL 10ML LL (SYRINGE) IMPLANT
TOWEL OR 17X24 6PK STRL BLUE (TOWEL DISPOSABLE) ×3 IMPLANT
TOWEL OR 17X26 10 PK STRL BLUE (TOWEL DISPOSABLE) ×3 IMPLANT
TUBE ANAEROBIC SPECIMEN COL (MISCELLANEOUS) IMPLANT
TUBE CONNECTING 12'X1/4 (SUCTIONS) ×1
TUBE CONNECTING 12X1/4 (SUCTIONS) ×2 IMPLANT
WATER STERILE IRR 1000ML POUR (IV SOLUTION) ×1 IMPLANT
YANKAUER SUCT BULB TIP NO VENT (SUCTIONS) ×3 IMPLANT

## 2016-04-19 NOTE — Anesthesia Postprocedure Evaluation (Signed)
Anesthesia Post Note  Patient: Caleb Singh  Procedure(s) Performed: Procedure(s) (LRB): REPEAT I&D RIGHT HAND (Right)  Patient location during evaluation: PACU Anesthesia Type: General Level of consciousness: awake Pain management: pain level controlled Vital Signs Assessment: post-procedure vital signs reviewed and stable Respiratory status: spontaneous breathing Cardiovascular status: stable Postop Assessment: no signs of nausea or vomiting Anesthetic complications: no    Last Vitals:  Vitals:   04/19/16 2105 04/19/16 2117  BP:  (!) 170/92  Pulse: 68 68  Resp: 15 16  Temp:  36.1 C    Last Pain:  Vitals:   04/19/16 2117  TempSrc:   PainSc: 2                  Deauna Yaw

## 2016-04-19 NOTE — Transfer of Care (Signed)
Immediate Anesthesia Transfer of Care Note  Patient: Caleb Singh  Procedure(s) Performed: Procedure(s): REPEAT I&D RIGHT HAND (Right)  Patient Location: PACU  Anesthesia Type:General  Level of Consciousness: awake, alert , oriented and patient cooperative  Airway & Oxygen Therapy: Patient Spontanous Breathing and Patient connected to nasal cannula oxygen  Post-op Assessment: Report given to RN, Post -op Vital signs reviewed and stable and Patient moving all extremities X 4  Post vital signs: Reviewed and stable  Last Vitals:  Vitals:   04/19/16 1903 04/19/16 2046  BP: (!) 147/82   Pulse: 71   Resp:    Temp: 36.8 C (P) 36.3 C    Last Pain:  Vitals:   04/19/16 1903  TempSrc: Oral  PainSc:       Patients Stated Pain Goal: 2 (04/19/16 1000)  Complications: No apparent anesthesia complications

## 2016-04-19 NOTE — H&P (Signed)
  Patient presents for irrigation debridement hand secondary to infectious process about the right middle finger and hand.  We are planning surgery for your upper extremity. The risk and benefits of surgery to include risk of bleeding, infection, anesthesia,  damage to normal structures and failure of the surgery to accomplish its intended goals of relieving symptoms and restoring function have been discussed in detail. With this in mind we plan to proceed. I have specifically discussed with the patient the pre-and postoperative regime and the dos and don'ts and risk and benefits in great detail. Risk and benefits of surgery also include risk of dystrophy(CRPS), chronic nerve pain, failure of the healing process to go onto completion and other inherent risks of surgery The relavent the pathophysiology of the disease/injury process, as well as the alternatives for treatment and postoperative course of action has been discussed in great detail with the patient who desires to proceed.  We will do everything in our power to help you (the patient) restore function to the upper extremity. It is a pleasure to see this patient today.  Journe Hallmark M.D.

## 2016-04-19 NOTE — Anesthesia Preprocedure Evaluation (Signed)
Anesthesia Evaluation  Patient identified by MRN, date of birth, ID band Patient awake    Reviewed: Allergy & Precautions, NPO status , Patient's Chart, lab work & pertinent test results  History of Anesthesia Complications Negative for: history of anesthetic complications  Airway Mallampati: II  TM Distance: >3 FB Neck ROM: Full    Dental  (+) Teeth Intact, Missing   Pulmonary neg shortness of breath, neg sleep apnea, neg COPD, neg recent URI, former smoker,    breath sounds clear to auscultation       Cardiovascular negative cardio ROS   Rhythm:Regular     Neuro/Psych negative neurological ROS  negative psych ROS   GI/Hepatic negative GI ROS, Neg liver ROS,   Endo/Other  negative endocrine ROS  Renal/GU negative Renal ROS     Musculoskeletal   Abdominal   Peds  Hematology negative hematology ROS (+)   Anesthesia Other Findings   Reproductive/Obstetrics                             Anesthesia Physical Anesthesia Plan  ASA: II  Anesthesia Plan: General   Post-op Pain Management:    Induction: Intravenous  Airway Management Planned: LMA  Additional Equipment: None  Intra-op Plan:   Post-operative Plan: Extubation in OR  Informed Consent: I have reviewed the patients History and Physical, chart, labs and discussed the procedure including the risks, benefits and alternatives for the proposed anesthesia with the patient or authorized representative who has indicated his/her understanding and acceptance.   Dental advisory given  Plan Discussed with: CRNA and Surgeon  Anesthesia Plan Comments:         Anesthesia Quick Evaluation

## 2016-04-19 NOTE — Anesthesia Procedure Notes (Signed)
Procedure Name: LMA Insertion Date/Time: 04/19/2016 7:53 PM Performed by: Faustino CongressWHITE, Marcella Dunnaway TENA Nolon Yellin Pre-anesthesia Checklist: Patient identified, Emergency Drugs available, Suction available and Patient being monitored Patient Re-evaluated:Patient Re-evaluated prior to inductionOxygen Delivery Method: Circle System Utilized Preoxygenation: Pre-oxygenation with 100% oxygen Intubation Type: IV induction Ventilation: Mask ventilation without difficulty LMA: LMA inserted LMA Size: 5.0 Number of attempts: 1 Placement Confirmation: positive ETCO2 Tube secured with: Tape Dental Injury: Teeth and Oropharynx as per pre-operative assessment

## 2016-04-19 NOTE — Op Note (Signed)
See dictation [id number] Michaelyn Wall MD

## 2016-04-19 NOTE — Anesthesia Postprocedure Evaluation (Signed)
Anesthesia Post Note  Patient: Caleb Singh  Procedure(s) Performed: Procedure(s) (LRB): IRRIGATION AND DEBRIDEMENT RIGHT HAND (Right)  Patient location during evaluation: PACU Anesthesia Type: General Level of consciousness: awake Pain management: pain level controlled Vital Signs Assessment: post-procedure vital signs reviewed and stable Respiratory status: spontaneous breathing Cardiovascular status: stable Anesthetic complications: no    Last Vitals:  Vitals:   04/19/16 0555 04/19/16 1355  BP: (!) 145/88 129/81  Pulse: 72 68  Resp: 16 16  Temp: 36.8 C 36.8 C    Last Pain:  Vitals:   04/19/16 1355  TempSrc: Oral  PainSc:                  EDWARDS,Raymone Pembroke

## 2016-04-19 NOTE — Op Note (Signed)
See dictation #[id number] Amanda PeaGramig MD

## 2016-04-20 ENCOUNTER — Encounter (HOSPITAL_COMMUNITY): Payer: Self-pay | Admitting: Orthopedic Surgery

## 2016-04-20 LAB — CBC
HEMATOCRIT: 41.3 % (ref 39.0–52.0)
Hemoglobin: 13.3 g/dL (ref 13.0–17.0)
MCH: 31 pg (ref 26.0–34.0)
MCHC: 32.2 g/dL (ref 30.0–36.0)
MCV: 96.3 fL (ref 78.0–100.0)
PLATELETS: 257 10*3/uL (ref 150–400)
RBC: 4.29 MIL/uL (ref 4.22–5.81)
RDW: 11.9 % (ref 11.5–15.5)
WBC: 8.8 10*3/uL (ref 4.0–10.5)

## 2016-04-20 NOTE — Op Note (Signed)
NAMEMarland Kitchen  CERRONE, DEBOLD NO.:  [id number]  MEDICAL RECORD NO.:  [id number]  LOCATION:  6N16C                        FACILITY:  [facility]  PHYSICIAN:  Dionne Ano. 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  PROCEDURE: 1. Irrigation and debridement of skin, subcutaneous tissue, muscle and     tendon excisional in nature with curette, scalpel and knife blade     right forearm. 2. Median nerve repair (major peripheral nerve) right forearm with     direct primary repair after excision of nonviable tissue at the     laceration site. 3. This patient also underwent a NeuroGen tube wrap with a 7-mm     NeuroGen tube.  SURGEON:  Dionne Ano. Amanda Pea, M.D.  ASSISTANT:  None.  COMPLICATIONS:  None.  ANESTHESIA:  General.  TOURNIQUET TIME:  Less than an hour.  INDICATIONS:  A 60 year old male presents with the above-mentioned diagnosis.  I have counseled him in regard to risks and benefits of surgery and a small injury to the area and ultimately was seen in my office.  Unfortunately, there was a 3-week delay in getting to my office.  The patient noted numbness in the index and thumb as well as loss of the thenar muscle based upon my examination, findings and discussing this with him.  I discussed with him I felt he had at least a partial injury to the nerve and would recommend exploration.  He presents for exploration and repair understanding the unpredictability of nerve repairs and risks and benefits.  OPERATIVE PROCEDURE:  The patient was seen by myself and Anesthesia, taken to the operative theater and underwent a smooth induction of general anesthetic.  He was prepped and draped in usual sterile fashion with Hibiclens  pre-scrub, followed by Betadine scrub and paint, followed by outline marks being made and tourniquet insufflation.  Tourniquet was insufflated.  Time-out was observed.  Preoperative antibiotics were given in the form of 3 g of Ancef.  I made a zigzag incision over the interval between the FCR and palmaris.  I released the antebrachial fascia and performed a fasciotomy.  Following this, I dissected down and identified the nerve proximally and distally and dissected into the zone of injury.  There was 75% laceration to the nerve.  I performed irrigation and debridement of skin, subcutaneous tissue, tendon and associated tissue and removed debris and other nonviable tissue. Copious irrigation was applied.  Following this, I then excised the thickened tissue where the laceration site was.  I took intraoperative photos before and after shots and then I performed an epineural repair with combination of 8-0 and 7-0 nylon.  The patient had an excellent epineural repair.  25% of the nerve was intact and thus I felt really good about the fascicular alignment.  The patient then had a NeuroGen tube placed around the nerve and this was steadied with suture material without difficulty.  Thus, direct primary repair of 75% median nerve laceration and collagen nerve  conduit in the form of a NeuroGen nerve wrap was placed.  I did this without difficulty.  Tourniquet was deflated.  A 15 mL Sensorcaine with epinephrine was placed for postop analgesia and the patient tolerated this well.  The patient will be monitored closely in the recovery room.  I should note, he was dressed sterilely with dorsal and volar fiberglass slabs.  Going forward, we will keep his wrist still for 3-4 weeks then begin active, active assisted and passive range of motion and initiate strengthening per tolerance once motion is achieved.  Once again motion at 3-1/2 to 4 weeks and then move forward aggressively with strengthening  once motion is achieved and he feels comfortable.  It was a pleasure to see him today and participate in his care, questions that surrounded the postop aftermath.  He was discharged home on oxycodone for pain and general precautions including vitamin C and vitamin B6.     Dionne AnoWilliam M. Amanda PeaGramig, M.D.     [facility]  D:  04/19/2016  T:  04/20/2016  Job:  [id number]

## 2016-04-20 NOTE — Progress Notes (Signed)
Pharmacy Antibiotic Note  Lamar LaundryFrederick Epling is a 60 y.o. male admitted on 04/17/2016 with hand infection s/p surgical I&D.  Pharmacy has been consulted for Vancomycin and Zosyn dosing post-op.  Now day #4 of abx for hand infection s/p I&D 8/29 and 8/30. Afebrile, WBC down to wnl. SCr down 0.99, CrCL 80 ml/min  Plan: Continue vancomycin 750mg  IV Q12 Continue Zosyn 3.375 gm IV q8h (4 hour infusion) Monitor clinical picture, renal function, VT prn F/U C&S, abx deescalation / LOT   Height: 5\' 10"  (177.8 cm) Weight: 170 lb (77.1 kg) IBW/kg (Calculated) : 73  Temp (24hrs), Avg:98 F (36.7 C), Min:97 F (36.1 C), Max:98.7 F (37.1 C)   Recent Labs Lab 04/17/16 0258 04/17/16 1510 04/18/16 0355 04/19/16 0429 04/20/16 0551  WBC 13.6*  --  12.7* 8.7 8.8  CREATININE 1.28*  --  0.99  --   --   LATICACIDVEN  --  1.13  --   --   --     Estimated Creatinine Clearance: 79.9 mL/min (by C-G formula based on SCr of 0.99 mg/dL).    No Active Allergies  Antimicrobials this admission: Vanc 8/29 >> Zosyn 8/29 >>  Dose adjustments this admission:   Microbiology results: 8/29 BCx x2 - ngtd 8/29 right middle finger abscess - ngtd 8/30 MRSA PCR - negative  Thank you for allowing pharmacy to be a part of this patient's care.  Enzo BiNathan Tamera Pingley, PharmD, BCPS Clinical Pharmacist Pager [phone] 04/20/2016 12:24 PM

## 2016-04-20 NOTE — Op Note (Signed)
NAMEMarland Singh  GEDALIA, MCMILLON NO.:  [id number]  MEDICAL RECORD NO.:  [id number]  LOCATION:  [id number]                        FACILITY:  [facility]  PHYSICIAN:  Dionne Ano. Shantavia Jha, M.D.DATE OF BIRTH:  1955-12-19  DATE OF PROCEDURE: DATE OF DISCHARGE:                              OPERATIVE REPORT   PREOPERATIVE DIAGNOSES:  Right middle finger and hand abscess with infectious tenosynovitis and open proximal interphalangeal joint with infectious proximal interphalangeal joint phenomenon.  POSTOPERATIVE DIAGNOSES:  Right middle finger and hand abscess with infectious tenosynovitis and open proximal interphalangeal joint with infectious proximal interphalangeal joint phenomenon.  OPERATION: 1. Repeat irrigation and debridement of deep abscess, right middle     finger and hand. 2. Extensor tenolysis, tenosynovectomy extensive in nature hand and     right middle finger. 3. Arthrotomy synovectomy, proximal interphalangeal joint ,right     middle finger.  SURGEON:  Dionne Ano. Amanda Pea, M.D.  ASSISTANT:  None.  COMPLICATIONS:  None.  ANESTHESIA:  General.  TOURNIQUET TIME:  Less than 30 minutes.  INDICATIONS:  The patient is a 60 year old male, who presents with the above-mentioned diagnosis.  I have counseled in regard to risks and benefits of surgery including risk of infection, bleeding, anesthesia, damage to normal structures, and failure of surgery to accomplish its intended goals of relieving symptoms and restoring function.  With this in mind, he desires to proceed.  All questions have been encouraged and answered preoperatively.  OPERATIVE PROCEDURE:  The patient was seen by myself and Anesthesia, taken to the operative theater and underwent smooth induction of general anesthetic.  He was prepped and draped in usual sterile fashion with Hibiclens pre-scrub followed by Betadine scrub and paint.  I should note the condition of his hand markedly improved with less erythema  and cellulitis.  Following this, I then performed a very careful and cautious approach to the extremity with elevation of the arm and tourniquet insufflation.  Time-out was observed.  I performed irrigation and debridement of deep abscess about the hand and right middle finger. Skin, subcutaneous tissue, tendon, and associated soft tissues underwent a debridement with scalpel, scissor, and knife blade.  This was an excisional debridement.  Following this, I then performed extensor tenolysis, tenosynovectomy about the right hand and the right middle finger.  An extensor tenolysis, tenosynovectomy, and removal of nonviable tissue was performed.  I then performed arthrotomy synovectomy of the PIP joint. The patient had a small amount of fluid still present that appeared to be slightly infectious, but certainly conditions were markedly improved.  The flexor apparatus looked stable.  The collateral ligament architecture was preserve nicely.  I irrigated with 3 L of saline and following the arthrotomy synovectomy of the PIP joint, loosely closed the wound over 2-vessel loop drains.  The area was loosely closed. Excellent refill was noted.  No complicating features were apparent.  He was placed in sterile dressing of Adaptic, Xeroform, gauze, Kerlix, Webril, and a volar plaster splint.  He tolerated the procedure well. We will monitor his care closely and see him back in the office once discharged if the infection goes on to quiescent state of affairs swiftly.  This note has been discussed.  He will  be continued in admit status and we will await his final cultures.     Dionne AnoWilliam M. Amanda PeaGramig, M.D.     [facility]MED[facility]  D:  04/19/2016  T:  04/20/2016  Job:  [id number]

## 2016-04-20 NOTE — Progress Notes (Signed)
Subjective: 1 Day Post-Op Procedure(s) (LRB): REPEAT I&D RIGHT HAND (Right) Patient reports pain as minimal. Rested well last night. Denies n/v/f/c. Tolerating PO's and voiding without difficulty.   Objective: Vital signs in last 24 hours: Temp:  [97 F (36.1 C)-98.7 F (37.1 C)] 98.2 F (36.8 C) (09/01 0603) Pulse Rate:  [68-81] 78 (09/01 0603) Resp:  [15-20] 18 (09/01 0603) BP: (129-170)/(76-92) 144/78 (09/01 0603) SpO2:  [96 %-100 %] 98 % (09/01 0603)  Intake/Output from previous day: 08/31 0701 - 09/01 0700 In: 460 [P.O.:160; I.V.:300] Out: 853 [Urine:850; Blood:3] Intake/Output this shift: No intake/output data recorded.   Recent Labs  04/18/16 0355 04/19/16 0429 04/20/16 0551  HGB 12.9* 12.9* 13.3    Recent Labs  04/19/16 0429 04/20/16 0551  WBC 8.7 8.8  RBC 4.11* 4.29  HCT 40.1 41.3  PLT 227 257    Recent Labs  04/18/16 0355  NA 139  K 3.9  CL 103  CO2 28  BUN 7  CREATININE 0.99  GLUCOSE 107*  CALCIUM 8.6*   No results for input(s): LABPT, INR in the last 72 hours. Results for orders placed or performed during the hospital encounter of 04/17/16  Culture, blood (Routine x 2)     Status: None (Preliminary result)   Collection Time: 04/17/16  2:58 PM  Result Value Ref Range Status   Specimen Description BLOOD LEFT ANTECUBITAL  Final   Special Requests BOTTLES DRAWN AEROBIC AND ANAEROBIC  10CC  Final   Culture NO GROWTH 2 DAYS  Final   Report Status PENDING  Incomplete  Aerobic/Anaerobic Culture (surgical/deep wound)     Status: None (Preliminary result)   Collection Time: 04/17/16  9:09 PM  Result Value Ref Range Status   Specimen Description ABSCESS  Final   Special Requests RIGHT MIDDLE FINGER  Final   Gram Stain   Final    MODERATE WBC PRESENT,BOTH PMN AND MONONUCLEAR NO ORGANISMS SEEN    Culture   Final    NO GROWTH 2 DAYS NO ANAEROBES ISOLATED; CULTURE IN PROGRESS FOR 5 DAYS   Report Status PENDING  Incomplete  Surgical pcr screen      Status: None   Collection Time: 04/18/16  7:50 PM  Result Value Ref Range Status   MRSA, PCR NEGATIVE NEGATIVE Final   Staphylococcus aureus NEGATIVE NEGATIVE Final    Comment:        The Xpert SA Assay (FDA approved for NASAL specimens in patients over 62 years of age), is one component of a comprehensive surveillance program.  Test performance has been validated by [facility] for patients greater than or equal to 62 year old. It is not intended to diagnose infection nor to guide or monitor treatment.    The patient is alert and oriented in no acute distress. The patient complains of pain in the affected upper extremity.  The patient is noted to have a normal HEENT exam. Lung fields show equal chest expansion and no shortness of breath. Abdomen exam is nontender without distention. Lower extremity examination does not show any fracture dislocation or blood clot symptoms. Pelvis is stable and the neck and back are stable and nontender. RUE: splint clean and dry, nv intact, no signs of ascending cellulitis, drains intact Assessment/Plan: 1 Day Post-Op Procedure(s) (LRB): REPEAT I&D RIGHT HAND (Right) Plan for continued IV abx, await final cx Wound check tomorrow and likely d/c home  Kimberla Driskill L 04/20/2016, 8:41 AM

## 2016-04-21 LAB — BASIC METABOLIC PANEL
ANION GAP: 7 (ref 5–15)
BUN: 8 mg/dL (ref 6–20)
CALCIUM: 8.9 mg/dL (ref 8.9–10.3)
CO2: 28 mmol/L (ref 22–32)
Chloride: 104 mmol/L (ref 101–111)
Creatinine, Ser: 1.11 mg/dL (ref 0.61–1.24)
GLUCOSE: 105 mg/dL — AB (ref 65–99)
Potassium: 4 mmol/L (ref 3.5–5.1)
SODIUM: 139 mmol/L (ref 135–145)

## 2016-04-21 MED ORDER — OXYCODONE HCL 5 MG PO TABS: 5.0000 mg | ORAL_TABLET | ORAL | 0 refills | Status: DC | PRN

## 2016-04-21 MED ORDER — DIPHENHYDRAMINE-ZINC ACETATE 2-0.1 % EX CREA
TOPICAL_CREAM | Freq: Three times a day (TID) | CUTANEOUS | Status: DC | PRN
  Filled 2016-04-21: qty 28

## 2016-04-21 MED ORDER — CLINDAMYCIN HCL 300 MG PO CAPS: 300.0000 mg | ORAL_CAPSULE | Freq: Three times a day (TID) | ORAL | 0 refills | Status: DC

## 2016-04-21 NOTE — Progress Notes (Addendum)
Patient ID: Caleb Singh, male   DOB: [birth year], 60 y.o.   MRN: [id number] Much better  dressing changed at bedside  AF VSS Wound is C/D/I Will need close follow up as discussed with the patient RTC Tues DC on Clindamycin Micro results noted Dissappointing there is no isolate given the severe infection Azriella Mattia MD     Note Prior debridement involved skin subcutaneous tissue tendon and PIP joint -excisional with knife and scissors(see full op note)

## 2016-04-21 NOTE — Discharge Instructions (Signed)

## 2016-04-21 NOTE — Discharge Summary (Signed)
Physician Discharge Summary  Patient ID: Caleb Singh MRN: [id number] DOB/AGE: 62-May-1957 62 y.o.  Admit date: 04/17/2016 Discharge date:   Admission Diagnoses: Infected Right Hand History reviewed. No pertinent past medical history.  Discharge Diagnoses:  Active Problems:   Abscess of third finger of right hand   Surgeries: Procedure(s): REPEAT I&D RIGHT HAND on 04/17/2016 - 04/19/2016    Consultants:   Discharged Condition: Improved  Hospital Course: Caleb Singh is an 60 y.o. male who was admitted 04/17/2016 with a chief complaint of Chief Complaint  Patient presents with  . Hand Pain  . Rash  , and found to have a diagnosis of Infected Right Hand.  They were brought to the operating room on 04/17/2016 - 04/19/2016 and underwent Procedure(s): REPEAT I&D RIGHT HAND.    They were given perioperative antibiotics: Anti-infectives    Start     Dose/Rate Route Frequency Ordered Stop   04/21/16 0000  clindamycin (CLEOCIN) 300 MG capsule     300 mg Oral 3 times daily 04/21/16 1138     04/18/16 0900  vancomycin (VANCOCIN) IVPB 750 mg/150 ml premix     750 mg 150 mL/hr over 60 Minutes Intravenous Every 12 hours 04/17/16 2243     04/17/16 2245  piperacillin-tazobactam (ZOSYN) IVPB 3.375 g     3.375 g 12.5 mL/hr over 240 Minutes Intravenous Every 8 hours 04/17/16 2243     04/17/16 2038  vancomycin (VANCOCIN) 1-5 GM/200ML-% IVPB    Comments:  Joneen Caraway   : cabinet override      04/17/16 2038 04/18/16 0844   04/17/16 1945  vancomycin (VANCOCIN) 1,500 mg in sodium chloride 0.9 % 500 mL IVPB  Status:  Discontinued     1,500 mg 250 mL/hr over 120 Minutes Intravenous  Once 04/17/16 1931 04/17/16 2243   04/17/16 1945  piperacillin-tazobactam (ZOSYN) IVPB 3.375 g     3.375 g 100 mL/hr over 30 Minutes Intravenous  Once 04/17/16 1931 04/17/16 2014    .  They were given sequential compression devices, early ambulation, and Other (comment) for DVT prophylaxis.  Recent vital  signs: Patient Vitals for the past 24 hrs:  BP Temp Temp src Pulse Resp SpO2  04/21/16 0609 (!) 138/91 97.7 F (36.5 C) Oral 66 18 96 %  04/20/16 2132 (!) 142/77 98.3 F (36.8 C) Oral 74 19 96 %  04/20/16 1749 (!) 147/93 98.4 F (36.9 C) Oral 75 19 96 %  04/20/16 1410 (!) 138/92 97.5 F (36.4 C) Oral 79 20 98 %  .  Recent laboratory studies: No results found.  Discharge Medications:     Medication List    TAKE these medications   clindamycin 300 MG capsule Commonly known as:  CLEOCIN Take 1 capsule (300 mg total) by mouth 3 (three) times daily.   oxyCODONE 5 MG immediate release tablet Commonly known as:  Oxy IR/ROXICODONE Take 1-2 tablets (5-10 mg total) by mouth every 3 (three) hours as needed for moderate pain.       Diagnostic Studies: Dg Hand Complete Right  Result Date: 04/17/2016 CLINICAL DATA:  60 y/o M; pain beginning at the third proximal interphalangeal joint now spread to the entire hand with swelling, especially the middle finger. Small bumps on distal forearm patient believes is poison ivy. EXAM: RIGHT HAND - COMPLETE 3+ VIEW COMPARISON:  None. FINDINGS: Chronic ulnar styloid fracture. Degenerative changes of the radiocarpal joint with joint space narrowing and sclerosis of articular surfaces. Osteoarthrosis of multiple interphalangeal joints most pronounced in  the fifth distal interphalangeal joint and at the first carpometacarpal joint. No acute fracture or dislocation is identified. No suspicious osseous lesion. Mild soft tissue swelling of the hand. IMPRESSION: No acute bony or articular abnormality is identified. 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   On: 04/17/2016 19:57    They benefited maximally from their hospital stay and there were no complications.      Disposition: Final discharge disposition not confirmed Discharge Instructions    Call MD / Call 911    Complete by:  As directed   If you experience chest pain or shortness of breath, CALL 911 and be transported to the hospital emergency room.  If you develope a fever above 101 F, pus (white drainage) or increased drainage or redness at the wound, or calf pain, call your surgeon's office.   Constipation Prevention    Complete by:  As directed   Drink plenty of fluids.  Prune juice may be helpful.  You may use a stool softener, such as Colace (over the counter) 100 mg twice a day.  Use MiraLax (over the counter) for constipation as needed.   Diet - low sodium heart healthy    Complete by:  As directed   Increase activity slowly as tolerated    Complete by:  As directed     Follow-up Information    Karen ChafeGRAMIG III,Laymon Stockert M, MD Follow up in 3 day(s).   Specialty:  Orthopedic Surgery Why:  come to the office of Dr Amanda PeaGramig to [facility]9am tuesday Contact information: [address] [place] [address] [phone]            Signed: Karen ChafeGRAMIG III,Miciah Shealy M 04/21/2016, 11:39 AM

## 2016-04-22 LAB — AEROBIC/ANAEROBIC CULTURE (SURGICAL/DEEP WOUND): CULTURE: NO GROWTH

## 2016-04-22 LAB — CULTURE, BLOOD (ROUTINE X 2): Culture: NO GROWTH

## 2017-07-23 ENCOUNTER — Inpatient Hospital Stay (HOSPITAL_COMMUNITY): Payer: Non-veteran care

## 2017-07-23 ENCOUNTER — Emergency Department (HOSPITAL_COMMUNITY): Payer: Non-veteran care | Admitting: Anesthesiology

## 2017-07-23 ENCOUNTER — Emergency Department (HOSPITAL_COMMUNITY): Payer: Non-veteran care

## 2017-07-23 ENCOUNTER — Other Ambulatory Visit: Payer: Self-pay

## 2017-07-23 ENCOUNTER — Encounter (HOSPITAL_COMMUNITY)
Admission: EM | Disposition: A | Discharge status: Skilled Nursing Facility | Payer: Self-pay | Source: Home / Self Care | Attending: Internal Medicine

## 2017-07-23 ENCOUNTER — Inpatient Hospital Stay (HOSPITAL_COMMUNITY)
Admission: EM | Admit: 2017-07-23 | Discharge: 2017-08-31 | DRG: 853 | Disposition: A | Discharge status: Skilled Nursing Facility | Payer: Non-veteran care | Attending: Internal Medicine | Admitting: Internal Medicine

## 2017-07-23 ENCOUNTER — Encounter (HOSPITAL_COMMUNITY): Payer: Self-pay

## 2017-07-23 DIAGNOSIS — R109 Unspecified abdominal pain: Secondary | ICD-10-CM

## 2017-07-23 DIAGNOSIS — A419 Sepsis, unspecified organism: Principal | ICD-10-CM

## 2017-07-23 DIAGNOSIS — Z452 Encounter for adjustment and management of vascular access device: Secondary | ICD-10-CM | POA: Diagnosis not present

## 2017-07-23 DIAGNOSIS — Z515 Encounter for palliative care: Secondary | ICD-10-CM | POA: Diagnosis not present

## 2017-07-23 DIAGNOSIS — Q430 Meckel's diverticulum (displaced) (hypertrophic): Secondary | ICD-10-CM

## 2017-07-23 DIAGNOSIS — G522 Disorders of vagus nerve: Secondary | ICD-10-CM | POA: Diagnosis present

## 2017-07-23 DIAGNOSIS — K659 Peritonitis, unspecified: Secondary | ICD-10-CM | POA: Diagnosis not present

## 2017-07-23 DIAGNOSIS — R570 Cardiogenic shock: Secondary | ICD-10-CM | POA: Diagnosis not present

## 2017-07-23 DIAGNOSIS — K567 Ileus, unspecified: Secondary | ICD-10-CM | POA: Diagnosis not present

## 2017-07-23 DIAGNOSIS — I4891 Unspecified atrial fibrillation: Secondary | ICD-10-CM | POA: Diagnosis not present

## 2017-07-23 DIAGNOSIS — I129 Hypertensive chronic kidney disease with stage 1 through stage 4 chronic kidney disease, or unspecified chronic kidney disease: Secondary | ICD-10-CM | POA: Diagnosis present

## 2017-07-23 DIAGNOSIS — G9340 Encephalopathy, unspecified: Secondary | ICD-10-CM | POA: Diagnosis not present

## 2017-07-23 DIAGNOSIS — K651 Peritoneal abscess: Secondary | ICD-10-CM | POA: Diagnosis not present

## 2017-07-23 DIAGNOSIS — R0682 Tachypnea, not elsewhere classified: Secondary | ICD-10-CM | POA: Diagnosis not present

## 2017-07-23 DIAGNOSIS — I455 Other specified heart block: Secondary | ICD-10-CM | POA: Diagnosis not present

## 2017-07-23 DIAGNOSIS — R0600 Dyspnea, unspecified: Secondary | ICD-10-CM

## 2017-07-23 DIAGNOSIS — D62 Acute posthemorrhagic anemia: Secondary | ICD-10-CM | POA: Diagnosis not present

## 2017-07-23 DIAGNOSIS — Z7189 Other specified counseling: Secondary | ICD-10-CM

## 2017-07-23 DIAGNOSIS — N170 Acute kidney failure with tubular necrosis: Secondary | ICD-10-CM | POA: Diagnosis not present

## 2017-07-23 DIAGNOSIS — K668 Other specified disorders of peritoneum: Secondary | ICD-10-CM | POA: Diagnosis not present

## 2017-07-23 DIAGNOSIS — Z8679 Personal history of other diseases of the circulatory system: Secondary | ICD-10-CM

## 2017-07-23 DIAGNOSIS — E861 Hypovolemia: Secondary | ICD-10-CM | POA: Diagnosis not present

## 2017-07-23 DIAGNOSIS — J9600 Acute respiratory failure, unspecified whether with hypoxia or hypercapnia: Secondary | ICD-10-CM

## 2017-07-23 DIAGNOSIS — N179 Acute kidney failure, unspecified: Secondary | ICD-10-CM

## 2017-07-23 DIAGNOSIS — E875 Hyperkalemia: Secondary | ICD-10-CM | POA: Diagnosis not present

## 2017-07-23 DIAGNOSIS — T8143XA Infection following a procedure, organ and space surgical site, initial encounter: Secondary | ICD-10-CM

## 2017-07-23 DIAGNOSIS — K631 Perforation of intestine (nontraumatic): Secondary | ICD-10-CM

## 2017-07-23 DIAGNOSIS — Z4659 Encounter for fitting and adjustment of other gastrointestinal appliance and device: Secondary | ICD-10-CM

## 2017-07-23 DIAGNOSIS — I469 Cardiac arrest, cause unspecified: Secondary | ICD-10-CM | POA: Diagnosis not present

## 2017-07-23 DIAGNOSIS — Z978 Presence of other specified devices: Secondary | ICD-10-CM | POA: Diagnosis not present

## 2017-07-23 DIAGNOSIS — R531 Weakness: Secondary | ICD-10-CM

## 2017-07-23 DIAGNOSIS — R001 Bradycardia, unspecified: Secondary | ICD-10-CM

## 2017-07-23 DIAGNOSIS — I429 Cardiomyopathy, unspecified: Secondary | ICD-10-CM | POA: Diagnosis present

## 2017-07-23 DIAGNOSIS — K750 Abscess of liver: Secondary | ICD-10-CM | POA: Diagnosis present

## 2017-07-23 DIAGNOSIS — R6521 Severe sepsis with septic shock: Secondary | ICD-10-CM | POA: Diagnosis present

## 2017-07-23 DIAGNOSIS — G9341 Metabolic encephalopathy: Secondary | ICD-10-CM | POA: Diagnosis not present

## 2017-07-23 DIAGNOSIS — N183 Chronic kidney disease, stage 3 (moderate): Secondary | ICD-10-CM | POA: Diagnosis present

## 2017-07-23 DIAGNOSIS — E43 Unspecified severe protein-calorie malnutrition: Secondary | ICD-10-CM | POA: Diagnosis present

## 2017-07-23 DIAGNOSIS — Z419 Encounter for procedure for purposes other than remedying health state, unspecified: Secondary | ICD-10-CM

## 2017-07-23 DIAGNOSIS — J9811 Atelectasis: Secondary | ICD-10-CM | POA: Diagnosis not present

## 2017-07-23 DIAGNOSIS — R410 Disorientation, unspecified: Secondary | ICD-10-CM

## 2017-07-23 DIAGNOSIS — Z932 Ileostomy status: Secondary | ICD-10-CM | POA: Diagnosis not present

## 2017-07-23 DIAGNOSIS — Z86711 Personal history of pulmonary embolism: Secondary | ICD-10-CM

## 2017-07-23 DIAGNOSIS — K117 Disturbances of salivary secretion: Secondary | ICD-10-CM

## 2017-07-23 DIAGNOSIS — I495 Sick sinus syndrome: Secondary | ICD-10-CM | POA: Diagnosis present

## 2017-07-23 DIAGNOSIS — T8149XA Infection following a procedure, other surgical site, initial encounter: Secondary | ICD-10-CM

## 2017-07-23 DIAGNOSIS — Z6821 Body mass index (BMI) 21.0-21.9, adult: Secondary | ICD-10-CM

## 2017-07-23 DIAGNOSIS — Z87891 Personal history of nicotine dependence: Secondary | ICD-10-CM | POA: Diagnosis not present

## 2017-07-23 DIAGNOSIS — Z87898 Personal history of other specified conditions: Secondary | ICD-10-CM

## 2017-07-23 DIAGNOSIS — J811 Chronic pulmonary edema: Secondary | ICD-10-CM | POA: Diagnosis present

## 2017-07-23 DIAGNOSIS — D6489 Other specified anemias: Secondary | ICD-10-CM | POA: Diagnosis present

## 2017-07-23 DIAGNOSIS — R1084 Generalized abdominal pain: Secondary | ICD-10-CM | POA: Diagnosis not present

## 2017-07-23 DIAGNOSIS — R0603 Acute respiratory distress: Secondary | ICD-10-CM | POA: Diagnosis not present

## 2017-07-23 DIAGNOSIS — E860 Dehydration: Secondary | ICD-10-CM | POA: Diagnosis present

## 2017-07-23 DIAGNOSIS — E873 Alkalosis: Secondary | ICD-10-CM | POA: Diagnosis not present

## 2017-07-23 DIAGNOSIS — T8130XA Disruption of wound, unspecified, initial encounter: Secondary | ICD-10-CM | POA: Diagnosis not present

## 2017-07-23 DIAGNOSIS — F419 Anxiety disorder, unspecified: Secondary | ICD-10-CM | POA: Diagnosis present

## 2017-07-23 DIAGNOSIS — E872 Acidosis: Secondary | ICD-10-CM | POA: Diagnosis not present

## 2017-07-23 DIAGNOSIS — J9601 Acute respiratory failure with hypoxia: Secondary | ICD-10-CM | POA: Diagnosis not present

## 2017-07-23 DIAGNOSIS — E876 Hypokalemia: Secondary | ICD-10-CM | POA: Diagnosis not present

## 2017-07-23 DIAGNOSIS — F10231 Alcohol dependence with withdrawal delirium: Secondary | ICD-10-CM | POA: Diagnosis not present

## 2017-07-23 DIAGNOSIS — J9690 Respiratory failure, unspecified, unspecified whether with hypoxia or hypercapnia: Secondary | ICD-10-CM

## 2017-07-23 DIAGNOSIS — D696 Thrombocytopenia, unspecified: Secondary | ICD-10-CM | POA: Diagnosis present

## 2017-07-23 DIAGNOSIS — R601 Generalized edema: Secondary | ICD-10-CM | POA: Diagnosis present

## 2017-07-23 DIAGNOSIS — E871 Hypo-osmolality and hyponatremia: Secondary | ICD-10-CM | POA: Diagnosis present

## 2017-07-23 DIAGNOSIS — Z66 Do not resuscitate: Secondary | ICD-10-CM | POA: Diagnosis present

## 2017-07-23 DIAGNOSIS — K661 Hemoperitoneum: Secondary | ICD-10-CM | POA: Diagnosis not present

## 2017-07-23 DIAGNOSIS — L8990 Pressure ulcer of unspecified site, unspecified stage: Secondary | ICD-10-CM

## 2017-07-23 DIAGNOSIS — B9620 Unspecified Escherichia coli [E. coli] as the cause of diseases classified elsewhere: Secondary | ICD-10-CM | POA: Diagnosis present

## 2017-07-23 DIAGNOSIS — E870 Hyperosmolality and hypernatremia: Secondary | ICD-10-CM | POA: Diagnosis not present

## 2017-07-23 HISTORY — PX: LAPAROTOMY: SHX154

## 2017-07-23 LAB — TYPE AND SCREEN
ABO/RH(D): O POS
Antibody Screen: NEGATIVE

## 2017-07-23 LAB — COMPREHENSIVE METABOLIC PANEL
ALBUMIN: 3.3 g/dL — AB (ref 3.5–5.0)
ALT: 13 U/L — ABNORMAL LOW (ref 17–63)
AST: 47 U/L — AB (ref 15–41)
Alkaline Phosphatase: 78 U/L (ref 38–126)
Anion gap: 20 — ABNORMAL HIGH (ref 5–15)
BILIRUBIN TOTAL: 1.8 mg/dL — AB (ref 0.3–1.2)
BUN: 42 mg/dL — AB (ref 6–20)
CO2: 17 mmol/L — ABNORMAL LOW (ref 22–32)
Calcium: 8.8 mg/dL — ABNORMAL LOW (ref 8.9–10.3)
Chloride: 96 mmol/L — ABNORMAL LOW (ref 101–111)
Creatinine, Ser: 4.05 mg/dL — ABNORMAL HIGH (ref 0.61–1.24)
GFR calc Af Amer: 17 mL/min — ABNORMAL LOW (ref >60)
GFR calc non Af Amer: 14 mL/min — ABNORMAL LOW (ref >60)
GLUCOSE: 112 mg/dL — AB (ref 65–99)
POTASSIUM: 5.6 mmol/L — AB (ref 3.5–5.1)
Sodium: 133 mmol/L — ABNORMAL LOW (ref 135–145)
TOTAL PROTEIN: 7.2 g/dL (ref 6.5–8.1)

## 2017-07-23 LAB — I-STAT CHEM 8, ED
BUN: 55 mg/dL — ABNORMAL HIGH (ref 6–20)
CALCIUM ION: 1.03 mmol/L — AB (ref 1.15–1.40)
CHLORIDE: 101 mmol/L (ref 101–111)
Creatinine, Ser: 3.9 mg/dL — ABNORMAL HIGH (ref 0.61–1.24)
GLUCOSE: 111 mg/dL — AB (ref 65–99)
HCT: 55 % — ABNORMAL HIGH (ref 39.0–52.0)
Hemoglobin: 18.7 g/dL — ABNORMAL HIGH (ref 13.0–17.0)
Potassium: 5.4 mmol/L — ABNORMAL HIGH (ref 3.5–5.1)
Sodium: 132 mmol/L — ABNORMAL LOW (ref 135–145)
TCO2: 19 mmol/L — ABNORMAL LOW (ref 22–32)

## 2017-07-23 LAB — CBC WITH DIFFERENTIAL/PLATELET
BASOS ABS: 0 10*3/uL (ref 0.0–0.1)
Basophils Relative: 0 %
Eosinophils Absolute: 0.2 10*3/uL (ref 0.0–0.7)
Eosinophils Relative: 2 %
HCT: 51.6 % (ref 39.0–52.0)
Hemoglobin: 18.1 g/dL — ABNORMAL HIGH (ref 13.0–17.0)
LYMPHS ABS: 1.1 10*3/uL (ref 0.7–4.0)
Lymphocytes Relative: 12 %
MCH: 32.6 pg (ref 26.0–34.0)
MCHC: 35.1 g/dL (ref 30.0–36.0)
MCV: 92.8 fL (ref 78.0–100.0)
MONO ABS: 0.4 10*3/uL (ref 0.1–1.0)
MONOS PCT: 4 %
NEUTROS PCT: 82 %
Neutro Abs: 7.4 10*3/uL (ref 1.7–7.7)
Platelets: 283 10*3/uL (ref 150–400)
RBC: 5.56 MIL/uL (ref 4.22–5.81)
RDW: 12.6 % (ref 11.5–15.5)
WBC Morphology: INCREASED
WBC: 9.1 10*3/uL (ref 4.0–10.5)

## 2017-07-23 LAB — PROTIME-INR
INR: 1.78
Prothrombin Time: 20.5 seconds — ABNORMAL HIGH (ref 11.4–15.2)

## 2017-07-23 LAB — BLOOD GAS, ARTERIAL
Acid-base deficit: 6.9 mmol/L — ABNORMAL HIGH (ref 0.0–2.0)
Bicarbonate: 18.8 mmol/L — ABNORMAL LOW (ref 20.0–28.0)
DRAWN BY: 11249
FIO2: 60
LHR: 30 {breaths}/min
MECHVT: 420 mL
O2 Saturation: 95.4 %
PATIENT TEMPERATURE: 98.3
PCO2 ART: 39.7 mmHg (ref 32.0–48.0)
PEEP/CPAP: 5 cmH2O
PH ART: 7.295 — AB (ref 7.350–7.450)
PO2 ART: 90.2 mmHg (ref 83.0–108.0)

## 2017-07-23 LAB — I-STAT CG4 LACTIC ACID, ED: Lactic Acid, Venous: 10.2 mmol/L (ref 0.5–1.9)

## 2017-07-23 LAB — LIPASE, BLOOD: LIPASE: 47 U/L (ref 11–51)

## 2017-07-23 LAB — ABO/RH: ABO/RH(D): O POS

## 2017-07-23 LAB — LACTIC ACID, PLASMA
LACTIC ACID, VENOUS: 3.3 mmol/L — AB (ref 0.5–1.9)
LACTIC ACID, VENOUS: 3.5 mmol/L — AB (ref 0.5–1.9)

## 2017-07-23 LAB — PROCALCITONIN: PROCALCITONIN: 124.45 ng/mL

## 2017-07-23 SURGERY — LAPAROTOMY, EXPLORATORY
Anesthesia: General

## 2017-07-23 MED ORDER — SODIUM CHLORIDE 0.9 % IV SOLN
100.0000 mg | INTRAVENOUS | Status: DC
  Administered 2017-07-24 – 2017-08-04 (×12): 100 mg via INTRAVENOUS
  Filled 2017-07-23 (×13): qty 100

## 2017-07-23 MED ORDER — PHENYLEPHRINE HCL 10 MG/ML IJ SOLN
INTRAVENOUS | Status: DC | PRN
  Administered 2017-07-23: 50 ug/min via INTRAVENOUS

## 2017-07-23 MED ORDER — FENTANYL CITRATE (PF) 100 MCG/2ML IJ SOLN
INTRAMUSCULAR | Status: DC | PRN
  Administered 2017-07-23: 100 ug via INTRAVENOUS
  Administered 2017-07-23 (×2): 50 ug via INTRAVENOUS
  Administered 2017-07-23: 100 ug via INTRAVENOUS
  Administered 2017-07-23: 50 ug via INTRAVENOUS
  Administered 2017-07-23: 100 ug via INTRAVENOUS
  Administered 2017-07-23: 50 ug via INTRAVENOUS

## 2017-07-23 MED ORDER — PHENYLEPHRINE HCL 10 MG/ML IJ SOLN
INTRAMUSCULAR | Status: DC | PRN
  Administered 2017-07-23: 120 ug via INTRAVENOUS
  Administered 2017-07-23 (×2): 80 ug via INTRAVENOUS

## 2017-07-23 MED ORDER — IOPAMIDOL (ISOVUE-300) INJECTION 61%: 100.0000 mL | Freq: Once | INTRAVENOUS | Status: DC | PRN

## 2017-07-23 MED ORDER — PROPOFOL 1000 MG/100ML IV EMUL
0.0000 ug/kg/min | INTRAVENOUS | Status: DC
  Administered 2017-07-23: 5 ug/kg/min via INTRAVENOUS
  Filled 2017-07-23 (×2): qty 100

## 2017-07-23 MED ORDER — HYDROMORPHONE HCL 1 MG/ML IJ SOLN: 0.5000 mg | Freq: Once | INTRAMUSCULAR | Status: DC

## 2017-07-23 MED ORDER — PROPOFOL 10 MG/ML IV BOLUS
INTRAVENOUS | Status: AC
Start: 2017-07-23 — End: 2017-07-23
  Filled 2017-07-23: qty 20

## 2017-07-23 MED ORDER — PROPOFOL 10 MG/ML IV BOLUS
INTRAVENOUS | Status: DC | PRN
  Administered 2017-07-23: 100 mg via INTRAVENOUS

## 2017-07-23 MED ORDER — SODIUM CHLORIDE 0.9 % IV BOLUS (SEPSIS)
1000.0000 mL | Freq: Once | INTRAVENOUS | Status: AC
  Administered 2017-07-23: 1000 mL via INTRAVENOUS

## 2017-07-23 MED ORDER — SODIUM CHLORIDE 0.9 % IV SOLN
200.0000 mg | Freq: Once | INTRAVENOUS | Status: AC
  Administered 2017-07-23: 200 mg via INTRAVENOUS
  Filled 2017-07-23: qty 200

## 2017-07-23 MED ORDER — VITAMIN K1 10 MG/ML IJ SOLN
10.0000 mg | Freq: Once | INTRAMUSCULAR | Status: AC
  Administered 2017-07-23: 10 mg via SUBCUTANEOUS
  Filled 2017-07-23: qty 1

## 2017-07-23 MED ORDER — PANTOPRAZOLE SODIUM 40 MG IV SOLR
40.0000 mg | INTRAVENOUS | Status: DC
  Administered 2017-07-23 – 2017-07-24 (×2): 40 mg via INTRAVENOUS
  Filled 2017-07-23 (×2): qty 40

## 2017-07-23 MED ORDER — MIDAZOLAM HCL 5 MG/5ML IJ SOLN
INTRAMUSCULAR | Status: DC | PRN
  Administered 2017-07-23: 2 mg via INTRAVENOUS

## 2017-07-23 MED ORDER — MIDAZOLAM HCL 2 MG/2ML IJ SOLN
INTRAMUSCULAR | Status: AC
  Filled 2017-07-23: qty 2

## 2017-07-23 MED ORDER — SODIUM CHLORIDE 0.9 % IV SOLN
25.0000 ug/h | INTRAVENOUS | Status: DC
  Administered 2017-07-23: 50 ug/h via INTRAVENOUS
  Administered 2017-07-24 – 2017-07-26 (×4): 200 ug/h via INTRAVENOUS
  Administered 2017-07-27 – 2017-07-28 (×3): 100 ug/h via INTRAVENOUS
  Filled 2017-07-23 (×9): qty 50

## 2017-07-23 MED ORDER — SODIUM CHLORIDE 0.9 % IV SOLN
80.0000 mg | Freq: Once | INTRAVENOUS | Status: AC
  Administered 2017-07-23: 80 mg via INTRAVENOUS
  Filled 2017-07-23: qty 80

## 2017-07-23 MED ORDER — PIPERACILLIN-TAZOBACTAM 3.375 G IVPB 30 MIN
3.3750 g | Freq: Once | INTRAVENOUS | Status: AC
  Administered 2017-07-23: 3.375 g via INTRAVENOUS
  Filled 2017-07-23: qty 50

## 2017-07-23 MED ORDER — ONDANSETRON HCL 4 MG/2ML IJ SOLN
4.0000 mg | Freq: Once | INTRAMUSCULAR | Status: AC
  Administered 2017-07-23: 4 mg via INTRAVENOUS
  Filled 2017-07-23: qty 2

## 2017-07-23 MED ORDER — ROCURONIUM BROMIDE 100 MG/10ML IV SOLN
INTRAVENOUS | Status: DC | PRN
  Administered 2017-07-23: 30 mg via INTRAVENOUS
  Administered 2017-07-23: 20 mg via INTRAVENOUS
  Administered 2017-07-23: 50 mg via INTRAVENOUS

## 2017-07-23 MED ORDER — PIPERACILLIN-TAZOBACTAM 3.375 G IVPB
3.3750 g | Freq: Three times a day (TID) | INTRAVENOUS | Status: DC
  Administered 2017-07-23 – 2017-07-24 (×3): 3.375 g via INTRAVENOUS
  Filled 2017-07-23 (×3): qty 50

## 2017-07-23 MED ORDER — LACTATED RINGERS IV SOLN
INTRAVENOUS | Status: DC
  Administered 2017-07-23 (×4): via INTRAVENOUS

## 2017-07-23 MED ORDER — NOREPINEPHRINE BITARTRATE 1 MG/ML IV SOLN
0.0000 ug/min | INTRAVENOUS | Status: DC
  Administered 2017-07-23: 2 ug/min via INTRAVENOUS
  Administered 2017-07-24: 28 ug/min via INTRAVENOUS
  Filled 2017-07-23 (×3): qty 4

## 2017-07-23 MED ORDER — LACTATED RINGERS IV SOLN: INTRAVENOUS | Status: DC

## 2017-07-23 MED ORDER — VANCOMYCIN HCL IN DEXTROSE 1-5 GM/200ML-% IV SOLN: 1000.0000 mg | INTRAVENOUS | Status: DC

## 2017-07-23 MED ORDER — FENTANYL CITRATE (PF) 100 MCG/2ML IJ SOLN: 50.0000 ug | Freq: Once | INTRAMUSCULAR | Status: AC

## 2017-07-23 MED ORDER — ONDANSETRON HCL 4 MG/2ML IJ SOLN
INTRAMUSCULAR | Status: AC
  Filled 2017-07-23: qty 2

## 2017-07-23 MED ORDER — HEPARIN SODIUM (PORCINE) 5000 UNIT/ML IJ SOLN
5000.0000 [IU] | Freq: Three times a day (TID) | INTRAMUSCULAR | Status: DC
  Administered 2017-07-24 – 2017-07-25 (×4): 5000 [IU] via SUBCUTANEOUS
  Filled 2017-07-23 (×4): qty 1

## 2017-07-23 MED ORDER — SODIUM CHLORIDE 0.9 % IV SOLN: 100.0000 mg | INTRAVENOUS | Status: DC

## 2017-07-23 MED ORDER — HYDROMORPHONE HCL 1 MG/ML IJ SOLN
1.0000 mg | Freq: Once | INTRAMUSCULAR | Status: AC
  Administered 2017-07-23: 1 mg via INTRAVENOUS
  Filled 2017-07-23: qty 1

## 2017-07-23 MED ORDER — VANCOMYCIN HCL 10 G IV SOLR
1500.0000 mg | Freq: Once | INTRAVENOUS | Status: AC
  Administered 2017-07-23: 1500 mg via INTRAVENOUS
  Filled 2017-07-23: qty 1500

## 2017-07-23 MED ORDER — LIDOCAINE HCL (CARDIAC) 20 MG/ML IV SOLN
INTRAVENOUS | Status: DC | PRN
  Administered 2017-07-23: 40 mg via INTRAVENOUS

## 2017-07-23 MED ORDER — HYDROMORPHONE HCL 1 MG/ML IJ SOLN
0.5000 mg | Freq: Once | INTRAMUSCULAR | Status: AC
  Administered 2017-07-23: 0.5 mg via INTRAVENOUS
  Filled 2017-07-23: qty 1

## 2017-07-23 MED ORDER — SUCCINYLCHOLINE CHLORIDE 20 MG/ML IJ SOLN
INTRAMUSCULAR | Status: DC | PRN
  Administered 2017-07-23: 100 mg via INTRAVENOUS

## 2017-07-23 MED ORDER — FENTANYL BOLUS VIA INFUSION
25.0000 ug | INTRAVENOUS | Status: DC | PRN
Start: 2017-07-23 — End: 2017-07-29
  Administered 2017-07-23 – 2017-07-27 (×6): 50 ug via INTRAVENOUS
  Filled 2017-07-23: qty 50

## 2017-07-23 MED ORDER — LACTATED RINGERS IV SOLN
INTRAVENOUS | Status: DC | PRN
Start: 2017-07-23 — End: 2017-07-23
  Administered 2017-07-23: 19:00:00 via INTRAVENOUS

## 2017-07-23 MED ORDER — MIDAZOLAM HCL 2 MG/2ML IJ SOLN
1.0000 mg | INTRAMUSCULAR | Status: DC | PRN
  Administered 2017-07-23 – 2017-07-25 (×12): 2 mg via INTRAVENOUS
  Administered 2017-07-26: 3 mg via INTRAVENOUS
  Administered 2017-07-26: 2 mg via INTRAVENOUS
  Filled 2017-07-23 (×5): qty 2
  Filled 2017-07-23: qty 4
  Filled 2017-07-23 (×3): qty 2
  Filled 2017-07-23: qty 4
  Filled 2017-07-23 (×3): qty 2
  Filled 2017-07-23: qty 4

## 2017-07-23 MED ORDER — FENTANYL CITRATE (PF) 250 MCG/5ML IJ SOLN
INTRAMUSCULAR | Status: AC
  Filled 2017-07-23: qty 5

## 2017-07-23 MED ORDER — DEXAMETHASONE SODIUM PHOSPHATE 10 MG/ML IJ SOLN
INTRAMUSCULAR | Status: DC | PRN
  Administered 2017-07-23: 10 mg via INTRAVENOUS

## 2017-07-23 MED ORDER — 0.9 % SODIUM CHLORIDE (POUR BTL) OPTIME
TOPICAL | Status: DC | PRN
  Administered 2017-07-23: 12000 mL

## 2017-07-23 SURGICAL SUPPLY — 48 items
BLADE EXTENDED COATED 6.5IN (ELECTRODE) ×3 IMPLANT
CELLS DAT CNTRL 66122 CELL SVR (MISCELLANEOUS) IMPLANT
CLIP VESOCCLUDE LG 6/CT (CLIP) IMPLANT
COVER SURGICAL LIGHT HANDLE (MISCELLANEOUS) ×3 IMPLANT
DRAIN CHANNEL 19F RND (DRAIN) IMPLANT
DRAPE LAPAROSCOPIC ABDOMINAL (DRAPES) IMPLANT
DRESSING VAC ATS ABD (GAUZE/BANDAGES/DRESSINGS) ×2 IMPLANT
DRSG OPSITE POSTOP 4X6 (GAUZE/BANDAGES/DRESSINGS) IMPLANT
DRSG OPSITE POSTOP 4X8 (GAUZE/BANDAGES/DRESSINGS) IMPLANT
ELECT REM PT RETURN 15FT ADLT (MISCELLANEOUS) ×3 IMPLANT
EVACUATOR SILICONE 100CC (DRAIN) ×3 IMPLANT
GAUZE SPONGE 4X4 12PLY STRL (GAUZE/BANDAGES/DRESSINGS) ×3 IMPLANT
GLOVE BIO SURGEON STRL SZ7.5 (GLOVE) ×6 IMPLANT
GLOVE BIOGEL PI IND STRL 7.0 (GLOVE) ×1 IMPLANT
GLOVE BIOGEL PI INDICATOR 7.0 (GLOVE) ×2
GLOVE INDICATOR 8.0 STRL GRN (GLOVE) ×6 IMPLANT
GOWN STRL REUS W/TWL XL LVL3 (GOWN DISPOSABLE) ×18 IMPLANT
LEGGING LITHOTOMY PAIR STRL (DRAPES) IMPLANT
LIGASURE IMPACT 36 18CM CVD LR (INSTRUMENTS) ×2 IMPLANT
NS IRRIG 1000ML POUR BTL (IV SOLUTION) ×6 IMPLANT
PACK GENERAL/GYN (CUSTOM PROCEDURE TRAY) ×3 IMPLANT
RELOAD PROXIMATE 75MM BLUE (ENDOMECHANICALS) ×3 IMPLANT
RELOAD STAPLE 75 3.8 BLU REG (ENDOMECHANICALS) IMPLANT
RETRACTOR WND ALEXIS 18 MED (MISCELLANEOUS) IMPLANT
RTRCTR WOUND ALEXIS 18CM MED (MISCELLANEOUS)
SEALER TISSUE X1 CVD JAW (INSTRUMENTS) IMPLANT
SHEARS FOC LG CVD HARMONIC 17C (MISCELLANEOUS) IMPLANT
SHEARS HARMONIC ACE PLUS 36CM (ENDOMECHANICALS) IMPLANT
SPONGE ABDOMINAL VAC ABTHERA (MISCELLANEOUS) ×2 IMPLANT
STAPLER PROXIMATE 75MM BLUE (STAPLE) ×4 IMPLANT
STAPLER VISISTAT 35W (STAPLE) ×5 IMPLANT
SUT PDS AB 1 CTX 36 (SUTURE) IMPLANT
SUT PDS AB 1 TP1 96 (SUTURE) ×2 IMPLANT
SUT PDS AB 3-0 SH 27 (SUTURE) ×3 IMPLANT
SUT PDS AB 4-0 SH 27 (SUTURE) IMPLANT
SUT PROLENE 2 0 BLUE (SUTURE) IMPLANT
SUT PROLENE 2 0 SH DA (SUTURE) ×2 IMPLANT
SUT SILK 2 0 (SUTURE) ×9
SUT SILK 2 0 SH CR/8 (SUTURE) ×6 IMPLANT
SUT SILK 2 0SH CR/8 30 (SUTURE) ×6 IMPLANT
SUT SILK 2-0 18XBRD TIE 12 (SUTURE) ×2 IMPLANT
SUT SILK 2-0 30XBRD TIE 12 (SUTURE) IMPLANT
SUT SILK 3 0 (SUTURE) ×3
SUT SILK 3 0 SH CR/8 (SUTURE) ×6 IMPLANT
SUT SILK 3-0 18XBRD TIE 12 (SUTURE) ×2 IMPLANT
TOWEL OR 17X26 10 PK STRL BLUE (TOWEL DISPOSABLE) ×5 IMPLANT
TOWEL OR NON WOVEN STRL DISP B (DISPOSABLE) ×3 IMPLANT
TRAY FOLEY W/METER SILVER 16FR (SET/KITS/TRAYS/PACK) ×3 IMPLANT

## 2017-07-23 NOTE — Transfer of Care (Signed)
Immediate Anesthesia Transfer of Care Note  Patient: Caleb Singh  Procedure(s) Performed: EXPLORATORY LAPAROTOMY, SMALL BOWEL RESECTION (N/A )  Patient Location: ICU  Anesthesia Type:General  Level of Consciousness: Patient remains intubated per anesthesia plan  Airway & Oxygen Therapy: Patient remains intubated per anesthesia plan and Patient placed on Ventilator (see vital sign flow sheet for setting)  Post-op Assessment: Report given to RN and Post -op Vital signs reviewed and stable  Post vital signs: stable  Last Vitals:  Vitals:   07/23/17 1530 07/23/17 1600  BP: 113/66 112/79  Pulse: 88   Resp: 17 (!) 27  Temp:    SpO2: 94%     Last Pain:  Vitals:   07/23/17 1623  TempSrc:   PainSc: 10-Worst pain ever         Complications: No apparent anesthesia complications  Report [facility] to [facility] stable for transport  HR 124, BP 124/70 Placed on vent FIO2 60% peep5 RR30 TV 420 per Dr. Jean RosenthalJackson. Bilateral breath sounds per Jean RosenthalJackson.

## 2017-07-23 NOTE — ED Notes (Signed)
Surgery PA at bedside for consult.  

## 2017-07-23 NOTE — Consult Note (Signed)
PULMONARY / CRITICAL CARE MEDICINE   Name: Caleb Singh MRN: [id number] DOB: 1956-05-22    ADMISSION DATE:  07/23/2017 CONSULTATION DATE: 07/23/2017   CHIEF COMPLAINT: Abdominal pain  HISTORY OF PRESENT ILLNESS:   This is an otherwise healthy 61 year old who began having severe abdominal pain on Sunday evening.  Onset was acute and the pain is remained localized to the midline anteriorly.  He has not had any pain radiating through to his back.  He has not had any bowel movement since Sunday morning.  He has had some nausea but no vomiting.  He has had no p.o. intake since that time.  Chest x-ray obtained in the emergency room shows free air.  He denies a history of prior abdominal surgeries.  Denies a history of weight loss or overt blood in the stool. Pertinent to his surgical risk, he has no known history of coronary artery disease, chest pain myocardial infarction, CHF, or arrhythmias.  He does have a very distant history of pulmonary embolism.  He gives no history consistent with chronic lung disease, he reports normal exercise tolerance and no difficulty with unusual dyspnea.  He has never had any significant exposure to anesthesia in the past his only surgical procedure was debridement of a finger on his right hand.  PAST MEDICAL HISTORY :  He  has no past medical history on file.  Indicates to me that he had a pulmonary embolism in the very distant past.  PAST SURGICAL HISTORY: He  has a past surgical history that includes I&D extremity (Right, 04/17/2016) and I&D extremity (Right, 04/19/2016).  No Known Allergies  No current facility-administered medications on file prior to encounter.    Current Outpatient Medications on File Prior to Encounter  Medication Sig  . clindamycin (CLEOCIN) 300 MG capsule Take 1 capsule (300 mg total) by mouth 3 (three) times daily. (Patient not taking: Reported on 07/23/2017)  . oxyCODONE (OXY IR/ROXICODONE) 5 MG immediate release tablet Take 1-2  tablets (5-10 mg total) by mouth every 3 (three) hours as needed for moderate pain. (Patient not taking: Reported on 07/23/2017)  He reports to me that he currently takes no medications  FAMILY HISTORY:  His has no family status information on file.  Indicates to me that both parents suffered from diabetes.  SOCIAL HISTORY: He  reports that he has quit smoking. he has never used smokeless tobacco. He reports that he does not drink alcohol or use drugs.  REVIEW OF SYSTEMS:   10 system review of systems is really not contributory.  In general his weight has been stable and he is not noted any adenopathy.  As noted he has no cardiac history no history of chest pain no history of syncope no history of CHF symptoms.  He has no history of chronic lung disease he does not have a daily cough he does not have any unusual exercise intolerance.  He has no history of diabetes or thyroid disease.  He has no known history of bleeding diathesis.  SUBJECTIVE:  As above  VITAL SIGNS: BP 118/86   Pulse (!) 145   Temp 98.4 F (36.9 C) (Oral)   Resp (!) 50   Ht 5\' 10"  (1.778 m)   Wt 175 lb (79.4 kg)   SpO2 95%   BMI 25.11 kg/m   HEMODYNAMICS:    VENTILATOR SETTINGS:    INTAKE / OUTPUT: No intake/output data recorded.  PHYSICAL EXAMINATION: General: This is a remarkably stoic individual who reports pain but is not  in any overt distress. Neuro: He is alert and oriented and entirely appropriately interactive.  Pupils are equal and the EOMs are full.  There is a bilateral arcus.  He moves all fours on request. HEENT: There is a class I airway Cardiovascular: S1 and S2 are regular without murmur rub or gallop.  There is no dependent edema, there is no JVD Lungs: Respirations are unlabored, there is symmetric air movement, there are no wheezes. Abdomen: The abdomen is distended but not quite tense.  It is diffusely tender but there is no rebound.  There are no abdominal ecchymoses or  crepitus.   LABS:  BMET Recent Labs  Lab 07/23/17 1455 07/23/17 1504  NA 133* 132*  K 5.6* 5.4*  CL 96* 101  CO2 17*  --   BUN 42* 55*  CREATININE 4.05* 3.90*  GLUCOSE 112* 111*    Electrolytes Recent Labs  Lab 07/23/17 1455  CALCIUM 8.8*    CBC Recent Labs  Lab 07/23/17 1455 07/23/17 1504  WBC 9.1  --   HGB 18.1* 18.7*  HCT 51.6 55.0*  PLT 283  --     Coag's Recent Labs  Lab 07/23/17 1455  INR 1.78    Sepsis Markers Recent Labs  Lab 07/23/17 1455  LATICACIDVEN 10.20*    ABG No results for input(s): PHART, PCO2ART, PO2ART in the last 168 hours.  Liver Enzymes Recent Labs  Lab 07/23/17 1455  AST 47*  ALT 13*  ALKPHOS 78  BILITOT 1.8*  ALBUMIN 3.3*    Cardiac Enzymes No results for input(s): TROPONINI, PROBNP in the last 168 hours.  Glucose No results for input(s): GLUCAP in the last 168 hours.  Imaging Dg Abd Acute W/chest  Result Date: 07/23/2017 CLINICAL DATA:  Severe abdominal pain and distention, tachycardia, shortness of breath EXAM: DG ABDOMEN ACUTE W/ 1V CHEST COMPARISON:  None FINDINGS: Normal heart size, mediastinal contours and pulmonary vascularity. Mild bibasilar atelectasis. Lungs otherwise clear. No infiltrate, pleural effusion or pneumothorax. Free intraperitoneal air consistent with perforated viscus. Nonobstructive bowel gas pattern. No bowel dilatation or bowel wall thickening. Degenerative changes of the spine. No urinary tract calcifications. IMPRESSION: Free intraperitoneal air consistent with perforated viscus. Mild bibasilar atelectasis. Critical Value/emergent results were called by telephone at the time of interpretation on 07/23/2017 at 2:55 pm to [facility] PISCIOTTA PA, who verbally acknowledged these results. Electronically Signed   By: Ulyses SouthwardMark  Boles M.D.   On: 07/23/2017 14:56       DISCUSSION: Free air suggestive of a perforated viscus in a patient with essentially no medical risk for surgery.  He has a elevated  creatinine which I suspect represents an acute kidney injury secondary to dehydration.  We will be placing a central line anticipating the need for generous fluids and pressors in the perioperative period, is already been started empirically on Zosyn and I have ordered serial lactates and pro-calcitonin to ensure that he is responding to therapy.  He will need to be in the ICU setting postoperatively for monitoring of vitals as well as urine output to guarantee the adequacy of volume resuscitation.  ASSESSMENT / PLAN:    RENAL Likely an acute kidney injury related to dehydration.  Will evaluate further should his urine output and creatinine failed to respond to hydration.  GASTROINTESTINAL Perforated viscus.  Surgery is aware of the patient and anticipating exploration in the very near future.  Greater than 35 minutes was spent in the care of this patient today, not counting time spent performing  procedures   Penny PiaWJ Gray, MD Pulmonary and Critical Care Medicine [facility] Pager: [phone]  07/23/2017, 4:04 PM

## 2017-07-23 NOTE — ED Provider Notes (Signed)
Medical screening examination/treatment/procedure(s) were conducted as a shared visit with non-physician practitioner(s) and myself.  I personally evaluated the patient during the encounter.   EKG Interpretation None     61 year old male resents with abdominal pain distention times 2 days.  Notes constipation and has been using laxatives without relief.  Has had emesis as well to.  On exam his abdomen is distended with diffuse tenderness but no peritoneal signs.  Suspect obstruction.  Will medicate with IV fluids and pain medication abdominal CT pending   Lorre NickAllen, Xzander Gilham, MD 07/23/17 1414

## 2017-07-23 NOTE — Consult Note (Signed)
Expand All Collapse All      [] Hide copied text  [] Hover for details   Caleb Singh is an 61 y.o. male.   [facility] Referral:  Wynetta EmeryNicole Pisciotta PA-C   Chief Complaint: abdominal pain HPI: Pt presents to the ED with abdominal pain, distension and constipation with no BM since 07/21/17.  He reports taking Castor oil and prune juice this AM.  He presents with an abdomen so distended it is hard for him to breath.   Work up in the ED shows he is afebrile, HR up to 140's at one point RR up to 40-50 range. Na 132, K+ 5.4,creatinine 3.90, Lactate is 10.2, More labs pending H/H 18.7/55.  Acute abdomen shows free intrperitoneal air consistent with perforated Viscus.     History reviewed. No pertinent past medical history.      Remote hx of pneumonia and PE 1982          Past Surgical History:  Procedure Laterality Date  . I&D EXTREMITY Right 04/17/2016    Procedure: IRRIGATION AND DEBRIDEMENT RIGHT HAND;  Surgeon: Dominica SeverinWilliam Gramig, MD;  [facility];  Service: Orthopedics;  Laterality: Right;  . I&D EXTREMITY Right 04/19/2016    Procedure: REPEAT I&D RIGHT HAND;  Surgeon: Dominica SeverinWilliam Gramig, MD;  Location: [facility] OR;  Service: Orthopedics;  Laterality: Right;      History reviewed. No pertinent family history. Social History:  reports that he has quit smoking. he has never used smokeless tobacco. He reports that he does not drink alcohol or use drugs. ETOH:  Wine 1.5 liters per week or more Drugs:  No longer using Marijuana Tobacco:  1PPD x 27 years, quite 2014 Lives alone, lives on Social Security       Allergies: No Known Allergies          Prior to Admission medications   Medication Sig Start Date End Date Taking? Authorizing Provider  clindamycin (CLEOCIN) 300 MG capsule Take 1 capsule (300 mg total) by mouth 3 (three) times daily. Patient not taking: Reported on 07/23/2017 04/21/16     Dominica SeverinGramig, William, MD  oxyCODONE (OXY IR/ROXICODONE) 5 MG immediate release tablet Take 1-2 tablets  (5-10 mg total) by mouth every 3 (three) hours as needed for moderate pain. Patient not taking: Reported on 07/23/2017 04/21/16     Dominica SeverinGramig, William, MD        Lab Results Last 48 Hours       Results for orders placed or performed during the hospital encounter of 07/23/17 (from the past 48 hour(s))  I-Stat CG4 Lactic Acid, ED     Status: Abnormal    Collection Time: 07/23/17  2:55 PM  Result Value Ref Range    Lactic Acid, Venous 10.20 (HH) 0.5 - 1.9 mmol/L    Comment NOTIFIED PHYSICIAN         Imaging Results (Last 48 hours)  Dg Abd Acute W/chest   Result Date: 07/23/2017 CLINICAL DATA:  Severe abdominal pain and distention, tachycardia, shortness of breath EXAM: DG ABDOMEN ACUTE W/ 1V CHEST COMPARISON:  None FINDINGS: Normal heart size, mediastinal contours and pulmonary vascularity. Mild bibasilar atelectasis. Lungs otherwise clear. No infiltrate, pleural effusion or pneumothorax. Free intraperitoneal air consistent with perforated viscus. Nonobstructive bowel gas pattern. No bowel dilatation or bowel wall thickening. Degenerative changes of the spine. No urinary tract calcifications. IMPRESSION: Free intraperitoneal air consistent with perforated viscus. Mild bibasilar atelectasis. Critical Value/emergent results were called by telephone at the time of interpretation on 07/23/2017 at  2:55 pm to [facility], who verbally acknowledged these results. Electronically Signed   By: Ulyses SouthwardMark  Boles M.D.   On: 07/23/2017 14:56       Review of Systems  Constitutional: Positive for chills. Negative for diaphoresis, fever, malaise/fatigue and weight loss.  HENT: Negative.   Eyes: Negative.   Respiratory: Negative.   Cardiovascular: Negative.   Gastrointestinal: Positive for abdominal pain, constipation, heartburn and nausea. Negative for blood in stool, diarrhea, melena and vomiting.  Genitourinary:       Decreased urine output since 07/20/17  Musculoskeletal: Negative.   Skin: Negative.    Neurological: Positive for weakness.  Endo/Heme/Allergies: Negative.   Psychiatric/Behavioral: Positive for substance abuse (he admits to 1.5 liters per week of wine, maybe more.  None since last week).      Blood pressure (!) 144/52, pulse (!) 55, temperature 98.4 F (36.9 C), temperature source Oral, resp. rate (!) 54, height 5\' 10"  (1.778 m), weight 79.4 kg (175 lb), SpO2 95 %. Physical Exam  Constitutional: He is oriented to person, place, and time. He appears well-developed and well-nourished. He appears distressed.  HENT:  Head: Normocephalic and atraumatic.  Mouth/Throat: Oropharynx is clear and moist. No oropharyngeal exudate.  Eyes: Right eye exhibits no discharge. Left eye exhibits no discharge. No scleral icterus.  Pupils pin point after dilaudid, equal  Neck: Normal range of motion. Neck supple. No JVD present. No tracheal deviation present. No thyromegaly present.  Cardiovascular: Normal rate, regular rhythm, normal heart sounds and intact distal pulses.  No murmur heard. Respiratory: Effort normal and breath sounds normal. No respiratory distress. He has no wheezes. He has no rales. He exhibits no tenderness.  GI: He exhibits distension (Markedly distended, tight and tympanic). He exhibits no mass. There is tenderness. There is no rebound and no guarding.  His abdomen is markedly distended, and tight.  He is tender and sore all over, but does not have the pain or peritonitis I would expect.  He did just get dilaudid  Musculoskeletal: He exhibits no edema or tenderness.  Lymphadenopathy:    He has no cervical adenopathy.  Neurological: He is alert and oriented to person, place, and time. No cranial nerve deficit.  Skin: Skin is warm and dry. No rash noted. He is not diaphoretic. No erythema. No pallor.  Psychiatric: He has a normal mood and affect. His behavior is normal. Judgment and thought content normal.      Assessment/Plan Perforated Viscus Acute renal  failure Sepsis ETOH use Hx of tobacco use Remote hx of PE/pneumonia   Plan:  I have ask CCM to see and admit. We will take him to the OR ASAP.  We have started PPI, Zosyn, and fluids.     Johnnye Sandford, PA-C 07/23/2017, 3:01 PM

## 2017-07-23 NOTE — Anesthesia Procedure Notes (Signed)
Arterial Line Insertion Start/End12/11/2016 6:10 PM, 07/23/2017 6:15 PM Performed by: Minerva EndsMirarchi, Angela M, CRNA, CRNA  Patient location: OR. Preanesthetic checklist: patient identified, IV checked, site marked, risks and benefits discussed, surgical consent, monitors and equipment checked, pre-op evaluation, timeout performed and anesthesia consent Left, radial was placed Catheter size: 20 G Hand hygiene performed , maximum sterile barriers used  and Seldinger technique used Allen's test indicative of satisfactory collateral circulation Attempts: 1 Procedure performed without using ultrasound guided technique. Following insertion, dressing applied and Biopatch. Patient tolerated the procedure well with no immediate complications.

## 2017-07-23 NOTE — ED Notes (Signed)
Bed: WA05 Expected date:  Expected time:  Means of arrival:  Comments: HOLD EMS 

## 2017-07-23 NOTE — Progress Notes (Addendum)
I noticed pt having full body jerking vs tremor. Pupils assessed, PERRLA. Some eye movement noted, not nystagmus but rather random eye movement intermittently. Pt not following commands, but had not been following commands since arrival from PACU. Morrie SheldonAshley, RN was in the process of restarting pt sedation. During this event we noted pt HR to increase to 148-150 bpm, and pt Arterial line SBP was reading over 200. Dr. Jamison NeighborNestor was notified, he ordered RN to give 2mg  Versed now and continue to get pt sedation restarted. Following administration of Versed, pt SBP began coming down and body jerking vs tremor subsided. Nelly LaurenceAshley S., RN, pt primary nurse, present in the room during event. Will continue to monitor.

## 2017-07-23 NOTE — ED Provider Notes (Signed)
[facility]-EMERGENCY DEPT Provider Note   CSN: [id number] Arrival date & time: 07/23/17  1334     History   Chief Complaint Chief Complaint  Patient presents with  . Abdominal Pain   HPI   Blood pressure 121/60, pulse (!) 54, temperature 98.4 F (36.9 C), temperature source Oral, resp. rate (!) 24, height 5\' 10"  (1.778 m), weight 79.4 kg (175 lb), SpO2 96 %.  Caleb Singh is a 61 y.o. male with no significant past medical history complaining of severely worsening abdominal distention, pain, nonbloody, nonbilious, non-coffee-ground emesis and lack of bowel movements over the course of the last 3 days no BM in 48 hours, feels like he needs to deficate.  He denies prior abdominal surgeries, no fever or chills.  Patient is unsure if he is passing flatus.  He is never had any liver issues he does drink regularly but has never had any seizures or hallucinations when he does not drink, he has not had any alcohol in approximately 1 week.  He is never seen a gastroenterologist.  Normal urinate, he states that the abdomen is so distended that it is making it hard to breathe.  [facility] at [facility]  History reviewed. No pertinent past medical history.  Patient Active Problem List   Diagnosis Date Noted  . Abscess of third finger of right hand 04/17/2016    Past Surgical History:  Procedure Laterality Date  . I&D EXTREMITY Right 04/17/2016   Procedure: IRRIGATION AND DEBRIDEMENT RIGHT HAND;  Surgeon: Dominica Severin, MD;  Location: [facility] OR;  Service: Orthopedics;  Laterality: Right;  . I&D EXTREMITY Right 04/19/2016   Procedure: REPEAT I&D RIGHT HAND;  Surgeon: Dominica Severin, MD;  Location: [facility] OR;  Service: Orthopedics;  Laterality: Right;       Home Medications    Prior to Admission medications   Medication Sig Start Date End Date Taking? Authorizing Provider  clindamycin (CLEOCIN) 300 MG capsule Take 1 capsule (300 mg total) by mouth 3 (three) times  daily. Patient not taking: Reported on 07/23/2017 04/21/16   Dominica Severin, MD  oxyCODONE (OXY IR/ROXICODONE) 5 MG immediate release tablet Take 1-2 tablets (5-10 mg total) by mouth every 3 (three) hours as needed for moderate pain. Patient not taking: Reported on 07/23/2017 04/21/16   Dominica Severin, MD    Family History History reviewed. No pertinent family history.  Social History Social History   Tobacco Use  . Smoking status: Former Games developer  . Smokeless tobacco: Never Used  Substance Use Topics  . Alcohol use: No  . Drug use: No     Allergies   Patient has no known allergies.   Review of Systems Review of Systems  A complete review of systems was obtained and all systems are negative except as noted in the HPI and PMH.   Physical Exam Updated Vital Signs BP 112/79   Pulse 88   Temp 98.4 F (36.9 C) (Oral)   Resp (!) 27   Ht 5\' 10"  (1.778 m)   Wt 79.4 kg (175 lb)   SpO2 94%   BMI 25.11 kg/m   Physical Exam  Constitutional: He is oriented to person, place, and time. He appears well-developed and well-nourished. He appears distressed.  Distress, appears acutely uncomfortable, not diaphoretic  HENT:  Head: Normocephalic and atraumatic.  Mouth/Throat: Oropharynx is clear and moist.  Eyes: Conjunctivae and EOM are normal. Pupils are equal, round, and reactive to light.  Neck: Normal range of motion.  Cardiovascular:  Regular rhythm and intact distal pulses.  Tachypneic  Pulmonary/Chest: Breath sounds normal.  Abdominal: Soft. He exhibits distension. He exhibits no mass. There is tenderness. There is no rebound and no guarding. No hernia.  Severely distended abdomen, no appreciable bowel sounds.  No prior surgical scars.  No guarding or rebound.  Diffusely tender to palpation.  Genitourinary:  Genitourinary Comments: Bilateral inguinal hernias easily reducible and not testicular swelling, no subcutaneous air in the groin.  Musculoskeletal: Normal range of motion.   Neurological: He is alert and oriented to person, place, and time.  Skin: He is not diaphoretic.  Psychiatric: He has a normal mood and affect.  Nursing note and vitals reviewed.    ED Treatments / Results  Labs (all labs ordered are listed, but only abnormal results are displayed) Labs Reviewed  CBC WITH DIFFERENTIAL/PLATELET - Abnormal; Notable for the following components:      Result Value   Hemoglobin 18.1 (*)    All other components within normal limits  COMPREHENSIVE METABOLIC PANEL - Abnormal; Notable for the following components:   Sodium 133 (*)    Potassium 5.6 (*)    Chloride 96 (*)    CO2 17 (*)    Glucose, Bld 112 (*)    BUN 42 (*)    Creatinine, Ser 4.05 (*)    Calcium 8.8 (*)    Albumin 3.3 (*)    AST 47 (*)    ALT 13 (*)    Total Bilirubin 1.8 (*)    GFR calc non Af Amer 14 (*)    GFR calc Af Amer 17 (*)    Anion gap 20 (*)    All other components within normal limits  PROTIME-INR - Abnormal; Notable for the following components:   Prothrombin Time 20.5 (*)    All other components within normal limits  I-STAT CG4 LACTIC ACID, ED - Abnormal; Notable for the following components:   Lactic Acid, Venous 10.20 (*)    All other components within normal limits  I-STAT CHEM 8, ED - Abnormal; Notable for the following components:   Sodium 132 (*)    Potassium 5.4 (*)    BUN 55 (*)    Creatinine, Ser 3.90 (*)    Glucose, Bld 111 (*)    Calcium, Ion 1.03 (*)    TCO2 19 (*)    Hemoglobin 18.7 (*)    HCT 55.0 (*)    All other components within normal limits  CULTURE, BLOOD (ROUTINE X 2)  CULTURE, BLOOD (ROUTINE X 2)  LIPASE, BLOOD  PROCALCITONIN  LACTIC ACID, PLASMA  LACTIC ACID, PLASMA  BLOOD GAS, ARTERIAL  PROCALCITONIN  PROTIME-INR  CBC WITH DIFFERENTIAL/PLATELET  BASIC METABOLIC PANEL  MAGNESIUM  PHOSPHORUS  TYPE AND SCREEN  ABO/RH    EKG  EKG Interpretation None       Radiology Ct Abdomen Pelvis Wo Contrast  Result Date:  07/23/2017 CLINICAL DATA:  Severe abdominal pain and distention. EXAM: CT ABDOMEN AND PELVIS WITHOUT CONTRAST TECHNIQUE: Multidetector CT imaging of the abdomen and pelvis was performed following the standard protocol without IV contrast. COMPARISON:  None. FINDINGS: Lower chest: Normal heart size without pericardial effusion. Small bilateral pleural effusions with adjacent atelectasis. Hepatobiliary: Branching air like lucencies in the left hepatic lobe, series 2 image 29 and coronal series 5 image 37 concerning for portal venous gas. No space-occupying mass of the liver. The gallbladder is physiologically distended. Pancreas: Normal pancreas without ductal dilatation or mass on this unenhanced study. Spleen: Normal  size spleen. Adrenals/Urinary Tract: Normal bilateral adrenal glands. 2.8 cm right interpolar posterior complex cyst with thin septation. Hyperdense left lower pole 0.8 cm lesion likely hemorrhagic or proteinaceous cyst. Decompressed urinary bladder. Stomach/Bowel: Fluid-filled distention of the stomach. The contours of the stomach appear intact without apparent ulceration nor perforation. Fluid-filled distention of the second through fourth portions of the duodenum without perforation nor retroperitoneal emphysema. Moderate fluid-filled distention of jejunal loops with pneumatosis. Serpiginous extraluminal air emanating off a segment of small intestine in the right hemiabdomen, series 2, image 51 might represent a potential site for small bowel perforation. The colon is unremarkable apart from scattered colonic diverticulosis. Vascular/Lymphatic: Mild aortoiliac atherosclerosis without aneurysm. No lymphadenopathy. Reproductive: Prostate is normal in size. Other: Moderate to marked amount of free intraperitoneal air admixed with free fluid, subdiaphragmatic, perihepatic and perisplenic splenic in location extending along both paracolic gutters into the pelvis. Small bowel containing right inguinal  hernia with fluid containing left inguinal hernia. Musculoskeletal: Thoracolumbar spondylosis. IMPRESSION: 1. Large amount of free intraperitoneal air and fluid consistent with perforated hollow viscus likely involving small intestine with possible source in the right hemiabdomen, series 2, image 51. Critical Value/emergent results were called by telephone at the time of interpretation on 07/23/2017 at 4:59 pm to PA [facility] , who verbally acknowledged these results. 2. Abnormal bowel gas pattern concerning for small bowel pneumatosis with small amount of portal venous gas in the left hepatic lobe. This raises concern for ischemic bowel. 3. Bilateral inguinal hernias on the right containing a short segment of small intestine and on the left containing fluid. These are reportedly reducible clinically. 4. Complex bilateral renal cysts. Electronically Signed   By: Tollie Eth M.D.   On: 07/23/2017 17:03   Dg Chest Portable 1 View  Result Date: 07/23/2017 CLINICAL DATA:  Status post central line placement EXAM: PORTABLE CHEST 1 VIEW COMPARISON:  None. FINDINGS: There is a left subclavian central venous catheter with the tip projecting over the SVC. There is no focal parenchymal opacity. There is no pleural effusion or pneumothorax. The heart and mediastinal contours are unremarkable. The osseous structures are unremarkable. Pneumoperitoneum is not well delineated on the current x-ray. IMPRESSION: 1. Left subclavian central venous catheter with the tip projecting over the SVC. 2. Pneumoperitoneum is not well delineated on the current x-ray. Please refer to CT of abdomen for further characterization. Electronically Signed   By: Elige Ko   On: 07/23/2017 16:56   Dg Abd Acute W/chest  Result Date: 07/23/2017 CLINICAL DATA:  Severe abdominal pain and distention, tachycardia, shortness of breath EXAM: DG ABDOMEN ACUTE W/ 1V CHEST COMPARISON:  None FINDINGS: Normal heart size, mediastinal contours and  pulmonary vascularity. Mild bibasilar atelectasis. Lungs otherwise clear. No infiltrate, pleural effusion or pneumothorax. Free intraperitoneal air consistent with perforated viscus. Nonobstructive bowel gas pattern. No bowel dilatation or bowel wall thickening. Degenerative changes of the spine. No urinary tract calcifications. IMPRESSION: Free intraperitoneal air consistent with perforated viscus. Mild bibasilar atelectasis. Critical Value/emergent results were called by telephone at the time of interpretation on 07/23/2017 at 2:55 pm to [facility] PA, who verbally acknowledged these results. Electronically Signed   By: Ulyses Southward M.D.   On: 07/23/2017 14:56    Procedures Procedures (including critical care time)  CRITICAL CARE Performed by: Wynetta Emery   Total critical care time: 65 minutes  Critical care time was exclusive of separately billable procedures and treating other patients.  Critical care was necessary to treat or prevent  imminent or life-threatening deterioration.  Critical care was time spent personally by me on the following activities: development of treatment plan with patient and/or surrogate as well as nursing, discussions with consultants, evaluation of patient's response to treatment, examination of patient, obtaining history from patient or surrogate, ordering and performing treatments and interventions, ordering and review of laboratory studies, ordering and review of radiographic studies, pulse oximetry and re-evaluation of patient's condition.   Medications Ordered in ED Medications  iopamidol (ISOVUE-300) 61 % injection 100 mL (not administered)  sodium chloride 0.9 % bolus 1,000 mL (1,000 mLs Intravenous New Bag/Given 07/23/17 1712)  lactated ringers infusion ( Intravenous New Bag/Given 07/23/17 1713)  phytonadione (VITAMIN K) SQ injection 10 mg (not administered)  sodium chloride 0.9 % bolus 1,000 mL (0 mLs Intravenous Stopped 07/23/17 1611)   ondansetron (ZOFRAN) injection 4 mg (4 mg Intravenous Given 07/23/17 1504)  HYDROmorphone (DILAUDID) injection 1 mg (1 mg Intravenous Given 07/23/17 1504)  piperacillin-tazobactam (ZOSYN) IVPB 3.375 g (0 g Intravenous Stopped 07/23/17 1611)  sodium chloride 0.9 % bolus 1,000 mL (1,000 mLs Intravenous New Bag/Given 07/23/17 1623)  pantoprazole (PROTONIX) 80 mg in sodium chloride 0.9 % 100 mL IVPB (0 mg Intravenous Stopped 07/23/17 1711)  HYDROmorphone (DILAUDID) injection 0.5 mg (0.5 mg Intravenous Given 07/23/17 1624)     Initial Impression / Assessment and Plan / ED Course  I have reviewed the triage vital signs and the nursing notes.  Pertinent labs & imaging results that were available during my care of the patient were reviewed by me and considered in my medical decision making (see chart for details).  Clinical Course as of Jul 23 1722  Tue Jul 23, 2017  1459 Free air on acute abdominal series, lactic acid 10, general surgery paged, attending aware  [NP]  1514 Discussed with general surgery APP will Marlyne Beards who is aware of the free air, recommends noncontrast CT and P CCM consultation.  Discussed with intensivist Dr. Wallace Cullens who will be in to evaluate the patient, agrees with current care plan and expedited to OR  [NP]  1652 Verbal report from radiology states that he has portal venous air and pneumatosis.  Likely small bowel origin.  [NP]  1653 General surgery has been to see the patient and will admit, PCCM has put in central line.  [NP]    Clinical Course User Index [NP] Wynetta Emery, [place]    Vitals:   07/23/17 1444 07/23/17 1500 07/23/17 1530 07/23/17 1600  BP: (!) 144/52 118/86 113/66 112/79  Pulse: (!) 55 (!) 145 88   Resp: (!) 54 (!) 50 17 (!) 27  Temp: 98.4 F (36.9 C)     TempSrc: Oral     SpO2: 95% 95% 94%   Weight: 79.4 kg (175 lb)     Height: 5\' 10"  (1.778 m)       Medications  iopamidol (ISOVUE-300) 61 % injection 100 mL (not administered)  sodium chloride 0.9  % bolus 1,000 mL (1,000 mLs Intravenous New Bag/Given 07/23/17 1712)  lactated ringers infusion ( Intravenous New Bag/Given 07/23/17 1713)  phytonadione (VITAMIN K) SQ injection 10 mg (not administered)  sodium chloride 0.9 % bolus 1,000 mL (0 mLs Intravenous Stopped 07/23/17 1611)  ondansetron (ZOFRAN) injection 4 mg (4 mg Intravenous Given 07/23/17 1504)  HYDROmorphone (DILAUDID) injection 1 mg (1 mg Intravenous Given 07/23/17 1504)  piperacillin-tazobactam (ZOSYN) IVPB 3.375 g (0 g Intravenous Stopped 07/23/17 1611)  sodium chloride 0.9 % bolus 1,000 mL (1,000 mLs Intravenous New Bag/Given 07/23/17  1623)  pantoprazole (PROTONIX) 80 mg in sodium chloride 0.9 % 100 mL IVPB (0 mg Intravenous Stopped 07/23/17 1711)  HYDROmorphone (DILAUDID) injection 0.5 mg (0.5 mg Intravenous Given 07/23/17 1624)    Caleb Singh is 61 y.o. male presenting with severe abdominal distention, nausea vomiting abdominal pain and shortness of breath secondary to distention.  No prior history of liver dysfunction, he is a heavy drinker, he has never had a history of DTs.  Patient afebrile, appears acutely uncomfortable but not diaphoretic, he is tachypneic and tachycardic, lactic acid and blood cultures were drawn.  Acute abdominal series in addition to CT scan ordered.  Attending physician made aware who has evaluated him immediately upon my evaluation and agrees with care plan.  Lactic 10, fluids initiated, discussed with surgery who would like to start Zosyn, would like critical care admit.  Is seen and evaluated at the bedside, much more comfortable after Dilaudid.  Zosyn is initiated, they are working on second blood culture, I have instructed nurses not to wait for cultures to initiate antibiotics.  Discussed with Will Marlyne BeardsJennings who also recommend starting Protonix.  We will expedite this patient to the OR.  Pt to OR 5:11PM    Final Clinical Impressions(s) / ED Diagnoses   Final diagnoses:  Perforation bowel [facility](HCC)   AKI (acute kidney injury) [facility](HCC)    ED Discharge Orders    None       Kaylyn Limisciotta, Zachary Nole, PA-C 07/23/17 1421    Shar Paez, Mardella Laymanicole, PA-C 07/23/17 1542    Jenise Iannelli, Joni Reiningicole, PA-C 07/23/17 1723    Lorre NickAllen, Anthony, MD 07/26/17 [phone]

## 2017-07-23 NOTE — Anesthesia Preprocedure Evaluation (Addendum)
Anesthesia Evaluation  Patient identified by MRN, date of birth, ID band Patient awake    Reviewed: Allergy & Precautions, H&P , Patient's Chart, lab work & pertinent test results, reviewed documented beta blocker date and time   Airway Mallampati: II  TM Distance: >3 FB Neck ROM: full    Dental no notable dental hx.    Pulmonary former smoker,    Pulmonary exam normal breath sounds clear to auscultation       Cardiovascular  Rhythm:regular Rate:Normal     Neuro/Psych    GI/Hepatic   Endo/Other    Renal/GU      Musculoskeletal   Abdominal   Peds  Hematology   Anesthesia Other Findings   Reproductive/Obstetrics                             Anesthesia Physical Anesthesia Plan  ASA: II and emergent  Anesthesia Plan: General   Post-op Pain Management:    Induction: Intravenous, Rapid sequence and Cricoid pressure planned  PONV Risk Score and Plan: 2 and Treatment may vary due to age or medical condition, Ondansetron and Dexamethasone  Airway Management Planned: Oral ETT  Additional Equipment: Arterial line and CVP  Intra-op Plan:   Post-operative Plan: Possible Post-op intubation/ventilation  Informed Consent: I have reviewed the patients History and Physical, chart, labs and discussed the procedure including the risks, benefits and alternatives for the proposed anesthesia with the patient or authorized representative who has indicated his/her understanding and acceptance.   Dental Advisory Given  Plan Discussed with: CRNA and Surgeon  Anesthesia Plan Comments: (  )        Anesthesia Quick Evaluation

## 2017-07-23 NOTE — Procedures (Signed)
Central line placement  A central line was placed anticipating the need for fluids and pressors as the patient is taken to the operating room for perforated viscus.  Informed consent was obtained from the patient and a timeout was performed.  Area over the left subclavian vein was extensively prepped with chlorhexidine and widely draped.  Sterile garb was donned in the left subclavian vein was easily cannulated.  A wire was gently passed into the vein and the tract was dilated.  A 20 cm 7 French catheter was placed over the wire without difficulty.  There was good flow from all ports and the catheter was sutured in place at 20 cm chest x-ray shows good placement and no pneumothorax

## 2017-07-23 NOTE — Progress Notes (Signed)
eLink Physician-Brief Progress Note Patient Name: Caleb Singh DOB: [birth year] MRN: [id number]   Date of Service  07/23/2017  HPI/Events of Note  Patient returned from OR on vent. Updated by Dr. Andrey CampanileWilson post-op. Has Art Line & CVL in place. LA as well as Procalcitonin noted to be elevated. Concern for intra-abdominal sepsis with perforation.  Has open abdomen.   eICU Interventions  1. Prophylaxis continuing w/ SCDs & Heparin Wilbur q8hr 2. Ordering Protonix IV daily. 3. Post-intubation Port CXR & ABG ordered 4. Ventilator orders placed 5. Continuing analgesia w/ fentanyl drip & bolus IV prn 6. Continuing sedation w/ Propofol & also ordering Versed as needed 7. Continuing Zosyn  8. Ordering VAncomycin & Eraxis      Intervention Category Major Interventions: Respiratory failure - evaluation and management  [name] 07/23/2017, 8:36 PM

## 2017-07-23 NOTE — H&P (Deleted)
Caleb Singh is an 61 y.o. male.   [facility]  Chief Complaint: abdominal pain HPI: Pt presents to the ED with abdominal pain, distension and constipation with no BM since 07/21/17.  He reports taking Castor oil and prune juice this AM.  He presents with an abdomen so distended it is hard for him to breath.   Work up in the ED shows he is afebrile, HR up to 140's at one point RR up to 40-50 range. Na 132, K+ 5.4,creatinine 3.90, Lactate is 10.2, More labs pending H/H 18.7/55.  Acute abdomen shows free intrperitoneal air consistent with perforated Viscus.   History reviewed. No pertinent past medical history.    Remote hx of pneumonia and PE 1982   Past Surgical History:  Procedure Laterality Date  . I&D EXTREMITY Right 04/17/2016   Procedure: IRRIGATION AND DEBRIDEMENT RIGHT HAND;  Surgeon: Dominica SeverinWilliam Gramig, MD;  [facility];  Service: Orthopedics;  Laterality: Right;  . I&D EXTREMITY Right 04/19/2016   Procedure: REPEAT I&D RIGHT HAND;  Surgeon: Dominica SeverinWilliam Gramig, MD;  Location: [facility] OR;  Service: Orthopedics;  Laterality: Right;    History reviewed. No pertinent family history. Social History:  reports that he has quit smoking. he has never used smokeless tobacco. He reports that he does not drink alcohol or use drugs. ETOH:  Wine 1.5 liters per week or more Drugs:  No longer using Marijuana Tobacco:  1PPD x 27 years, quite 2014 Lives alone, lives on Social Security    Allergies: No Known Allergies  Prior to Admission medications   Medication Sig Start Date End Date Taking? Authorizing Provider  clindamycin (CLEOCIN) 300 MG capsule Take 1 capsule (300 mg total) by mouth 3 (three) times daily. Patient not taking: Reported on 07/23/2017 04/21/16   Dominica SeverinGramig, William, MD  oxyCODONE (OXY IR/ROXICODONE) 5 MG immediate release tablet Take 1-2 tablets (5-10 mg total) by mouth every 3 (three) hours as needed for moderate pain. Patient not taking: Reported on 07/23/2017 04/21/16   Dominica SeverinGramig, William,  MD     Results for orders placed or performed during the hospital encounter of 07/23/17 (from the past 48 hour(s))  I-Stat CG4 Lactic Acid, ED     Status: Abnormal   Collection Time: 07/23/17  2:55 PM  Result Value Ref Range   Lactic Acid, Venous 10.20 (HH) 0.5 - 1.9 mmol/L   Comment NOTIFIED PHYSICIAN    Dg Abd Acute W/chest  Result Date: 07/23/2017 CLINICAL DATA:  Severe abdominal pain and distention, tachycardia, shortness of breath EXAM: DG ABDOMEN ACUTE W/ 1V CHEST COMPARISON:  None FINDINGS: Normal heart size, mediastinal contours and pulmonary vascularity. Mild bibasilar atelectasis. Lungs otherwise clear. No infiltrate, pleural effusion or pneumothorax. Free intraperitoneal air consistent with perforated viscus. Nonobstructive bowel gas pattern. No bowel dilatation or bowel wall thickening. Degenerative changes of the spine. No urinary tract calcifications. IMPRESSION: Free intraperitoneal air consistent with perforated viscus. Mild bibasilar atelectasis. Critical Value/emergent results were called by telephone at the time of interpretation on 07/23/2017 at 2:55 pm to [facility] PISCIOTTA PA, who verbally acknowledged these results. Electronically Signed   By: Ulyses SouthwardMark  Boles M.D.   On: 07/23/2017 14:56    Review of Systems  Constitutional: Positive for chills. Negative for diaphoresis, fever, malaise/fatigue and weight loss.  HENT: Negative.   Eyes: Negative.   Respiratory: Negative.   Cardiovascular: Negative.   Gastrointestinal: Positive for abdominal pain, constipation, heartburn and nausea. Negative for blood in stool, diarrhea, melena and vomiting.  Genitourinary:  Decreased urine output since 07/20/17  Musculoskeletal: Negative.   Skin: Negative.   Neurological: Positive for weakness.  Endo/Heme/Allergies: Negative.   Psychiatric/Behavioral: Positive for substance abuse (he admits to 1.5 liters per week of wine, maybe more.  None since last week).    Blood pressure (!)  144/52, pulse (!) 55, temperature 98.4 F (36.9 C), temperature source Oral, resp. rate (!) 54, height 5\' 10"  (1.778 m), weight 79.4 kg (175 lb), SpO2 95 %. Physical Exam  Constitutional: He is oriented to person, place, and time. He appears well-developed and well-nourished. He appears distressed.  HENT:  Head: Normocephalic and atraumatic.  Mouth/Throat: Oropharynx is clear and moist. No oropharyngeal exudate.  Eyes: Right eye exhibits no discharge. Left eye exhibits no discharge. No scleral icterus.  Pupils pin point after dilaudid, equal  Neck: Normal range of motion. Neck supple. No JVD present. No tracheal deviation present. No thyromegaly present.  Cardiovascular: Normal rate, regular rhythm, normal heart sounds and intact distal pulses.  No murmur heard. Respiratory: Effort normal and breath sounds normal. No respiratory distress. He has no wheezes. He has no rales. He exhibits no tenderness.  GI: He exhibits distension (Markedly distended, tight and tympanic). He exhibits no mass. There is tenderness. There is no rebound and no guarding.  His abdomen is markedly distended, and tight.  He is tender and sore all over, but does not have the pain or peritonitis I would expect.  He did just get dilaudid  Musculoskeletal: He exhibits no edema or tenderness.  Lymphadenopathy:    He has no cervical adenopathy.  Neurological: He is alert and oriented to person, place, and time. No cranial nerve deficit.  Skin: Skin is warm and dry. No rash noted. He is not diaphoretic. No erythema. No pallor.  Psychiatric: He has a normal mood and affect. His behavior is normal. Judgment and thought content normal.     Assessment/Plan Perforated Viscus ETOH use Hx of tobacco use Remote hx of PE/pneumonia  Plan:  I have ask CCM to see and admit. We will take him to the OR ASAP.  We have started PPI, Zosyn, and fluids.    Suella Cogar, PA-C 07/23/2017, 3:01 PM

## 2017-07-23 NOTE — Progress Notes (Signed)
eLink Physician-Brief Progress Note Patient Name: Caleb LaundryFrederick Mroczka DOB: 1955/10/02 MRN: [id number]   Date of Service  07/23/2017  HPI/Events of Note  Nurse contact her reporting of accelerated hypertension off sedation. Patient not following commands. Intermittent whole-body jerking versus tremor witnessed on camera. Forward gaze. Pupils appear symmetric.   eICU Interventions  1. Restarting propofol 2. Nurse to administer bolus IV Versed already ordered      Intervention Category Major Interventions: Other:  Lawanda CousinsJennings Ante Arredondo 07/23/2017, 9:02 PM

## 2017-07-23 NOTE — Brief Op Note (Signed)
07/23/2017  7:45 PM  PATIENT:  Caleb Singh  61 y.o. male  PRE-OPERATIVE DIAGNOSIS:  Perforated viscous, sepsis  POST-OPERATIVE DIAGNOSIS:  Perforated viscous, sepsis; distal small bowel perforation, question cecal ischemia  PROCEDURE:  Procedure(s): EXPLORATORY LAPAROTOMY (N/A)  Small bowel resection without anastomosis Placement of open abdominal wound vac system (AbThera)  SURGEON:  Surgeon(s) and Role:    Gaynelle Adu* Alias Villagran, MD - Primary    * Claud KelpIngram, Haywood, MD - Assisting  PHYSICIAN ASSISTANT:   FINDINGS: 1500cc of enteric contents; small bowel reactive wall thickening; distal small bowel perforation about 30-40 cm from cecum - had about 0.5 cm hole in small bowel and focal area of necrosis in mesentery next to perforation; downstream from this about 15 cm there 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 which was resected en bloc; cecum appeared dusky therefore pt needs second look exam; transected staple ends of small bowel tagged together with 2-0 prolene  ASSISTANTS: see above   ANESTHESIA:   general  EBL:  100 mL   BLOOD ADMINISTERED:none  DRAINS: Nasogastric Tube and Urinary Catheter (Foley)   LOCAL MEDICATIONS USED:  NONE  SPECIMEN:  Source of Specimen:  distal small bowel, stitch marks perforation  DISPOSITION OF SPECIMEN:  PATHOLOGY  COUNTS:  YES  TOURNIQUET:  * No tourniquets in log *  DICTATION: .Other Dictation: Dictation Number [id number]  PLAN OF CARE: icu  PATIENT DISPOSITION:  ICU - intubated and hemodynamically stable.   Delay start of Pharmacological VTE agent (>24hrs) due to surgical blood loss or risk of bleeding: no  Mary SellaEric M. Andrey CampanileWilson, MD, FACS General, Bariatric, & Minimally Invasive Surgery [facility], [place]

## 2017-07-23 NOTE — ED Triage Notes (Signed)
EMS reports call out for abdominal pian, assessment on site abdominal distention, constipation, no BM since Sunday. Took three teaspoons Castor oil and 8oz prune juice this morning with no resolve.  BP 137/102 HR  Resp 24

## 2017-07-23 NOTE — ED Notes (Signed)
Patient transported to X-ray 

## 2017-07-23 NOTE — Anesthesia Procedure Notes (Signed)
Procedure Name: Intubation Date/Time: 07/23/2017 6:08 PM Performed by: Thornell MuleStubblefield, Pelagia Iacobucci G, CRNA Pre-anesthesia Checklist: Patient identified, Emergency Drugs available, Suction available and Patient being monitored Patient Re-evaluated:Patient Re-evaluated prior to induction Oxygen Delivery Method: Circle system utilized Preoxygenation: Pre-oxygenation with 100% oxygen Induction Type: IV induction Ventilation: Mask ventilation without difficulty Laryngoscope Size: Miller and 3 Grade View: Grade I Tube type: Subglottic suction tube Tube size: 8.0 mm Number of attempts: 1 Airway Equipment and Method: Oral airway and Bougie stylet Placement Confirmation: ETT inserted through vocal cords under direct vision,  positive ETCO2 and breath sounds checked- equal and bilateral Secured at: 22 cm Tube secured with: Tape Dental Injury: Teeth and Oropharynx as per pre-operative assessment

## 2017-07-23 NOTE — Progress Notes (Signed)
Pharmacy Antibiotic Note  Caleb Singh is a 61 y.o. male admitted on 07/23/2017 with sepsis d/t IAI, perforated bowel.  Pharmacy has been consulted for vancomycin and zosyn dosing. Anidulafungin also ordered by MD.  AKI. S/p exploratory lap, small bowel resection 12/4.  Plan: Vancomycin 1500 mg loading dose followed by 1g q48h for now but will need to adjust as AKI resolves.  Goal AUC 400-500. Zosyn 3.375g IV q8h (4 hour infusion time).  Daily SCr.  Height: 5\' 10"  (177.8 cm) Weight: 175 lb (79.4 kg) IBW/kg (Calculated) : 73  Temp (24hrs), Avg:98.4 F (36.9 C), Min:98.4 F (36.9 C), Max:98.4 F (36.9 C)  Recent Labs  Lab 07/23/17 1455 07/23/17 1504 07/23/17 1644  WBC 9.1  --   --   CREATININE 4.05* 3.90*  --   LATICACIDVEN 10.20*  --  3.3*    Estimated Creatinine Clearance: 20 mL/min (A) (by C-G formula based on SCr of 3.9 mg/dL (H)).    No Known Allergies  Antimicrobials this admission: 12/4 Zosyn >>  12/4 Vancomycin >>   Dose adjustments this admission:  Microbiology results: 12/4 BCx:   Thank you for allowing pharmacy to be a part of this patient's care.  Clance BollRunyon, Caleb Singh 07/23/2017 8:33 PM

## 2017-07-23 NOTE — ED Notes (Signed)
IV attempted x2 in other extremity. Another nurse to attempt. IV antibiotic started.

## 2017-07-24 ENCOUNTER — Inpatient Hospital Stay (HOSPITAL_COMMUNITY): Payer: Non-veteran care

## 2017-07-24 ENCOUNTER — Encounter (HOSPITAL_COMMUNITY): Payer: Self-pay | Admitting: General Surgery

## 2017-07-24 ENCOUNTER — Other Ambulatory Visit: Payer: Self-pay

## 2017-07-24 DIAGNOSIS — N179 Acute kidney failure, unspecified: Secondary | ICD-10-CM

## 2017-07-24 DIAGNOSIS — J9601 Acute respiratory failure with hypoxia: Secondary | ICD-10-CM

## 2017-07-24 DIAGNOSIS — R6521 Severe sepsis with septic shock: Secondary | ICD-10-CM

## 2017-07-24 DIAGNOSIS — N170 Acute kidney failure with tubular necrosis: Secondary | ICD-10-CM

## 2017-07-24 DIAGNOSIS — A419 Sepsis, unspecified organism: Principal | ICD-10-CM

## 2017-07-24 LAB — BLOOD GAS, ARTERIAL
Acid-base deficit: 8.4 mmol/L — ABNORMAL HIGH (ref 0.0–2.0)
Acid-base deficit: 7.6 mmol/L — ABNORMAL HIGH (ref 0.0–2.0)
BICARBONATE: 15 mmol/L — AB (ref 20.0–28.0)
BICARBONATE: 17.2 mmol/L — AB (ref 20.0–28.0)
DRAWN BY: 11249
Drawn by: 11249
FIO2: 60
FIO2: 40
MECHVT: 580 mL
O2 SAT: 99.2 %
O2 Saturation: 97.7 %
PATIENT TEMPERATURE: 98.7
PEEP/CPAP: 5 cmH2O
PEEP: 5 cmH2O
PO2 ART: 188 mmHg — AB (ref 83.0–108.0)
PO2 ART: 114 mmHg — AB (ref 83.0–108.0)
Patient temperature: 99
RATE: 30 resp/min
RATE: 22 resp/min
VT: 580 mL
pCO2 arterial: 26.8 mmHg — ABNORMAL LOW (ref 32.0–48.0)
pCO2 arterial: 34.4 mmHg (ref 32.0–48.0)
pH, Arterial: 7.368 (ref 7.350–7.450)
pH, Arterial: 7.32 — ABNORMAL LOW (ref 7.350–7.450)

## 2017-07-24 LAB — POCT I-STAT 7, (LYTES, BLD GAS, ICA,H+H)
Acid-base deficit: 8 mmol/L — ABNORMAL HIGH (ref 0.0–2.0)
Acid-base deficit: 10 mmol/L — ABNORMAL HIGH (ref 0.0–2.0)
BICARBONATE: 20.5 mmol/L (ref 20.0–28.0)
Bicarbonate: 16.8 mmol/L — ABNORMAL LOW (ref 20.0–28.0)
CALCIUM ION: 1.07 mmol/L — AB (ref 1.15–1.40)
Calcium, Ion: 1.09 mmol/L — ABNORMAL LOW (ref 1.15–1.40)
HCT: 42 % (ref 39.0–52.0)
HEMATOCRIT: 40 % (ref 39.0–52.0)
HEMOGLOBIN: 13.6 g/dL (ref 13.0–17.0)
Hemoglobin: 14.3 g/dL (ref 13.0–17.0)
O2 Saturation: 100 %
O2 Saturation: 100 %
PCO2 ART: 52.8 mmHg — AB (ref 32.0–48.0)
PCO2 ART: 41.4 mmHg (ref 32.0–48.0)
PO2 ART: 323 mmHg — AB (ref 83.0–108.0)
POTASSIUM: 4.1 mmol/L (ref 3.5–5.1)
Potassium: 4.6 mmol/L (ref 3.5–5.1)
SODIUM: 135 mmol/L (ref 135–145)
SODIUM: 136 mmol/L (ref 135–145)
TCO2: 22 mmol/L (ref 22–32)
TCO2: 18 mmol/L — AB (ref 22–32)
pH, Arterial: 7.202 — ABNORMAL LOW (ref 7.350–7.450)
pH, Arterial: 7.22 — ABNORMAL LOW (ref 7.350–7.450)
pO2, Arterial: 219 mmHg — ABNORMAL HIGH (ref 83.0–108.0)

## 2017-07-24 LAB — HEPATIC FUNCTION PANEL
ALBUMIN: 1.6 g/dL — AB (ref 3.5–5.0)
ALT: 24 U/L (ref 17–63)
AST: 56 U/L — AB (ref 15–41)
Alkaline Phosphatase: 32 U/L — ABNORMAL LOW (ref 38–126)
BILIRUBIN DIRECT: 0.3 mg/dL (ref 0.1–0.5)
Indirect Bilirubin: 0.5 mg/dL (ref 0.3–0.9)
Total Bilirubin: 0.8 mg/dL (ref 0.3–1.2)
Total Protein: 4.2 g/dL — ABNORMAL LOW (ref 6.5–8.1)

## 2017-07-24 LAB — BLOOD CULTURE ID PANEL (REFLEXED)
ACINETOBACTER BAUMANNII: NOT DETECTED
CANDIDA ALBICANS: NOT DETECTED
CANDIDA GLABRATA: NOT DETECTED
CANDIDA KRUSEI: NOT DETECTED
CANDIDA PARAPSILOSIS: NOT DETECTED
CARBAPENEM RESISTANCE: NOT DETECTED
Candida tropicalis: NOT DETECTED
ENTEROBACTER CLOACAE COMPLEX: NOT DETECTED
ENTEROBACTERIACEAE SPECIES: DETECTED — AB
ESCHERICHIA COLI: DETECTED — AB
Enterococcus species: NOT DETECTED
Haemophilus influenzae: NOT DETECTED
KLEBSIELLA OXYTOCA: NOT DETECTED
KLEBSIELLA PNEUMONIAE: NOT DETECTED
LISTERIA MONOCYTOGENES: NOT DETECTED
Neisseria meningitidis: NOT DETECTED
PSEUDOMONAS AERUGINOSA: NOT DETECTED
Proteus species: NOT DETECTED
STREPTOCOCCUS PNEUMONIAE: NOT DETECTED
STREPTOCOCCUS PYOGENES: NOT DETECTED
Serratia marcescens: NOT DETECTED
Staphylococcus aureus (BCID): NOT DETECTED
Staphylococcus species: NOT DETECTED
Streptococcus agalactiae: NOT DETECTED
Streptococcus species: NOT DETECTED

## 2017-07-24 LAB — LACTIC ACID, PLASMA
LACTIC ACID, VENOUS: 3.3 mmol/L — AB (ref 0.5–1.9)
LACTIC ACID, VENOUS: 3.5 mmol/L — AB (ref 0.5–1.9)
Lactic Acid, Venous: 4.1 mmol/L (ref 0.5–1.9)

## 2017-07-24 LAB — BASIC METABOLIC PANEL
Anion gap: 11 (ref 5–15)
BUN: 48 mg/dL — AB (ref 6–20)
CO2: 14 mmol/L — AB (ref 22–32)
Calcium: 6.8 mg/dL — ABNORMAL LOW (ref 8.9–10.3)
Chloride: 108 mmol/L (ref 101–111)
Creatinine, Ser: 4.32 mg/dL — ABNORMAL HIGH (ref 0.61–1.24)
GFR calc Af Amer: 15 mL/min — ABNORMAL LOW (ref >60)
GFR, EST NON AFRICAN AMERICAN: 13 mL/min — AB (ref >60)
GLUCOSE: 106 mg/dL — AB (ref 65–99)
POTASSIUM: 5.1 mmol/L (ref 3.5–5.1)
Sodium: 133 mmol/L — ABNORMAL LOW (ref 135–145)

## 2017-07-24 LAB — RENAL FUNCTION PANEL
ALBUMIN: 1.7 g/dL — AB (ref 3.5–5.0)
ANION GAP: 10 (ref 5–15)
Albumin: 1.7 g/dL — ABNORMAL LOW (ref 3.5–5.0)
Anion gap: 11 (ref 5–15)
BUN: 52 mg/dL — AB (ref 6–20)
BUN: 45 mg/dL — AB (ref 6–20)
CALCIUM: 6.2 mg/dL — AB (ref 8.9–10.3)
CALCIUM: 5.7 mg/dL — AB (ref 8.9–10.3)
CHLORIDE: 101 mmol/L (ref 101–111)
CO2: 18 mmol/L — ABNORMAL LOW (ref 22–32)
CO2: 19 mmol/L — AB (ref 22–32)
CREATININE: 3.56 mg/dL — AB (ref 0.61–1.24)
Chloride: 105 mmol/L (ref 101–111)
Creatinine, Ser: 4.54 mg/dL — ABNORMAL HIGH (ref 0.61–1.24)
GFR calc Af Amer: 15 mL/min — ABNORMAL LOW (ref >60)
GFR calc non Af Amer: 17 mL/min — ABNORMAL LOW (ref >60)
GFR, EST AFRICAN AMERICAN: 19 mL/min — AB (ref >60)
GFR, EST NON AFRICAN AMERICAN: 13 mL/min — AB (ref >60)
GLUCOSE: 109 mg/dL — AB (ref 65–99)
Glucose, Bld: 148 mg/dL — ABNORMAL HIGH (ref 65–99)
PHOSPHORUS: 4.2 mg/dL (ref 2.5–4.6)
POTASSIUM: 4.9 mmol/L (ref 3.5–5.1)
Phosphorus: 4.6 mg/dL (ref 2.5–4.6)
Potassium: 4.3 mmol/L (ref 3.5–5.1)
SODIUM: 133 mmol/L — AB (ref 135–145)
SODIUM: 131 mmol/L — AB (ref 135–145)

## 2017-07-24 LAB — CBC WITH DIFFERENTIAL/PLATELET
Basophils Absolute: 0 10*3/uL (ref 0.0–0.1)
Basophils Relative: 0 %
EOS PCT: 0 %
Eosinophils Absolute: 0 10*3/uL (ref 0.0–0.7)
HEMATOCRIT: 41.7 % (ref 39.0–52.0)
Hemoglobin: 14.5 g/dL (ref 13.0–17.0)
LYMPHS ABS: 0.7 10*3/uL (ref 0.7–4.0)
LYMPHS PCT: 8 %
MCH: 31.7 pg (ref 26.0–34.0)
MCHC: 34.8 g/dL (ref 30.0–36.0)
MCV: 91.2 fL (ref 78.0–100.0)
MONO ABS: 0.7 10*3/uL (ref 0.1–1.0)
MONOS PCT: 7 %
Neutro Abs: 7.4 10*3/uL (ref 1.7–7.7)
Neutrophils Relative %: 85 %
Platelets: 226 10*3/uL (ref 150–400)
RBC: 4.57 MIL/uL (ref 4.22–5.81)
RDW: 12.7 % (ref 11.5–15.5)
WBC Morphology: INCREASED
WBC: 8.8 10*3/uL (ref 4.0–10.5)

## 2017-07-24 LAB — PROTIME-INR
INR: 1.77
Prothrombin Time: 20.5 seconds — ABNORMAL HIGH (ref 11.4–15.2)

## 2017-07-24 LAB — ECHOCARDIOGRAM COMPLETE
HEIGHTINCHES: 70 in
Weight: 2800 oz

## 2017-07-24 LAB — PROCALCITONIN: Procalcitonin: >150 ng/mL

## 2017-07-24 LAB — MRSA PCR SCREENING: MRSA BY PCR: NEGATIVE

## 2017-07-24 LAB — TRIGLYCERIDES: TRIGLYCERIDES: 233 mg/dL — AB (ref <150)

## 2017-07-24 LAB — PHOSPHORUS: Phosphorus: 3.3 mg/dL (ref 2.5–4.6)

## 2017-07-24 LAB — CORTISOL: CORTISOL PLASMA: 32.7 ug/dL

## 2017-07-24 LAB — MAGNESIUM: Magnesium: 1.2 mg/dL — ABNORMAL LOW (ref 1.7–2.4)

## 2017-07-24 LAB — PREALBUMIN: Prealbumin: 9.3 mg/dL — ABNORMAL LOW (ref 18–38)

## 2017-07-24 MED ORDER — PRISMASOL BGK 4/2.5 32-4-2.5 MEQ/L IV SOLN
INTRAVENOUS | Status: DC
  Administered 2017-07-24 – 2017-07-28 (×19): via INTRAVENOUS_CENTRAL
  Filled 2017-07-24 (×33): qty 5000

## 2017-07-24 MED ORDER — CHLORHEXIDINE GLUCONATE CLOTH 2 % EX PADS
6.0000 | MEDICATED_PAD | Freq: Every day | CUTANEOUS | Status: DC
  Administered 2017-07-24: 6 via TOPICAL

## 2017-07-24 MED ORDER — SODIUM CHLORIDE 0.9% FLUSH
10.0000 mL | Freq: Two times a day (BID) | INTRAVENOUS | Status: DC
  Administered 2017-07-24: 30 mL
  Administered 2017-07-24 – 2017-07-27 (×6): 10 mL
  Administered 2017-07-27: 20 mL
  Administered 2017-07-28 – 2017-08-03 (×11): 10 mL

## 2017-07-24 MED ORDER — MAGNESIUM SULFATE 2 GM/50ML IV SOLN
2.0000 g | Freq: Once | INTRAVENOUS | Status: AC
  Administered 2017-07-24: 2 g via INTRAVENOUS
  Filled 2017-07-24: qty 50

## 2017-07-24 MED ORDER — PRISMASOL BGK 4/2.5 32-4-2.5 MEQ/L IV SOLN
INTRAVENOUS | Status: DC
  Administered 2017-07-24 – 2017-07-27 (×6): via INTRAVENOUS_CENTRAL
  Filled 2017-07-24 (×6): qty 5000

## 2017-07-24 MED ORDER — PRISMASOL BGK 4/2.5 32-4-2.5 MEQ/L IV SOLN
INTRAVENOUS | Status: DC
  Administered 2017-07-24 – 2017-07-27 (×7): via INTRAVENOUS_CENTRAL
  Filled 2017-07-24 (×12): qty 5000

## 2017-07-24 MED ORDER — SODIUM CHLORIDE 0.9 % IV SOLN
1.0000 g | Freq: Once | INTRAVENOUS | Status: AC
  Administered 2017-07-24: 1 g via INTRAVENOUS
  Filled 2017-07-24: qty 10

## 2017-07-24 MED ORDER — NOREPINEPHRINE BITARTRATE 1 MG/ML IV SOLN
0.0000 ug/min | INTRAVENOUS | Status: DC
  Administered 2017-07-24: 48 ug/min via INTRAVENOUS
  Administered 2017-07-24: 2 ug/min via INTRAVENOUS
  Administered 2017-07-25 (×2): 22 ug/min via INTRAVENOUS
  Administered 2017-07-26: 20 ug/min via INTRAVENOUS
  Administered 2017-07-27: 6 ug/min via INTRAVENOUS
  Filled 2017-07-24 (×8): qty 16

## 2017-07-24 MED ORDER — HEPARIN SODIUM (PORCINE) 1000 UNIT/ML DIALYSIS
1000.0000 [IU] | INTRAMUSCULAR | Status: DC | PRN
  Administered 2017-07-25: 2400 [IU] via INTRAVENOUS_CENTRAL
  Filled 2017-07-24: qty 3
  Filled 2017-07-24 (×2): qty 6
  Filled 2017-07-24: qty 3

## 2017-07-24 MED ORDER — THIAMINE HCL 100 MG/ML IJ SOLN
100.0000 mg | Freq: Every day | INTRAMUSCULAR | Status: DC
  Administered 2017-07-24 – 2017-08-11 (×19): 100 mg via INTRAVENOUS
  Filled 2017-07-24 (×19): qty 2

## 2017-07-24 MED ORDER — LACTATED RINGERS IV BOLUS (SEPSIS)
1000.0000 mL | Freq: Once | INTRAVENOUS | Status: AC
  Administered 2017-07-24: 1000 mL via INTRAVENOUS

## 2017-07-24 MED ORDER — PIPERACILLIN SOD-TAZOBACTAM SO 2.25 (2-0.25) G IV SOLR
3.3750 g | Freq: Four times a day (QID) | INTRAVENOUS | Status: DC
  Administered 2017-07-24 – 2017-07-28 (×17): 3.375 g via INTRAVENOUS
  Filled 2017-07-24 (×19): qty 3.38

## 2017-07-24 MED ORDER — FOLIC ACID 5 MG/ML IJ SOLN
1.0000 mg | Freq: Every day | INTRAMUSCULAR | Status: DC
  Administered 2017-07-24 – 2017-08-11 (×19): 1 mg via INTRAVENOUS
  Filled 2017-07-24 (×19): qty 0.2

## 2017-07-24 MED ORDER — HYDROCORTISONE NA SUCCINATE PF 100 MG IJ SOLR
50.0000 mg | Freq: Four times a day (QID) | INTRAMUSCULAR | Status: DC
  Administered 2017-07-24 – 2017-07-25 (×4): 50 mg via INTRAVENOUS
  Filled 2017-07-24 (×4): qty 2

## 2017-07-24 MED ORDER — SODIUM CHLORIDE 0.9 % FOR CRRT
INTRAVENOUS_CENTRAL | Status: DC | PRN
  Filled 2017-07-24: qty 1000

## 2017-07-24 MED ORDER — SODIUM CHLORIDE 0.9 % IV BOLUS (SEPSIS)
500.0000 mL | Freq: Once | INTRAVENOUS | Status: AC
  Administered 2017-07-24: 500 mL via INTRAVENOUS

## 2017-07-24 MED ORDER — VASOPRESSIN 20 UNIT/ML IV SOLN
0.0300 [IU]/min | INTRAVENOUS | Status: DC
  Administered 2017-07-24 – 2017-07-26 (×3): 0.03 [IU]/min via INTRAVENOUS
  Filled 2017-07-24 (×3): qty 2

## 2017-07-24 MED ORDER — SODIUM BICARBONATE 8.4 % IV SOLN
INTRAVENOUS | Status: DC
  Administered 2017-07-24 – 2017-07-25 (×3): via INTRAVENOUS
  Filled 2017-07-24 (×4): qty 150

## 2017-07-24 MED ORDER — HEPARIN SODIUM (PORCINE) 1000 UNIT/ML IJ SOLN
3000.0000 [IU] | INTRAMUSCULAR | Status: AC
  Administered 2017-07-24: 3000 [IU] via INTRAVENOUS
  Filled 2017-07-24: qty 3

## 2017-07-24 MED ORDER — SODIUM CHLORIDE 0.9% FLUSH: 10.0000 mL | INTRAVENOUS | Status: DC | PRN

## 2017-07-24 MED ORDER — SODIUM CHLORIDE 0.9 % IV BOLUS (SEPSIS)
1000.0000 mL | Freq: Once | INTRAVENOUS | Status: AC
  Administered 2017-07-24: 1000 mL via INTRAVENOUS

## 2017-07-24 MED ORDER — PHENYLEPHRINE HCL-NACL 10-0.9 MG/250ML-% IV SOLN: 0.0000 ug/min | INTRAVENOUS | Status: DC

## 2017-07-24 MED ORDER — ACETAMINOPHEN 10 MG/ML IV SOLN
1000.0000 mg | Freq: Once | INTRAVENOUS | Status: AC
  Administered 2017-07-24: 1000 mg via INTRAVENOUS
  Filled 2017-07-24: qty 100

## 2017-07-24 NOTE — Procedures (Signed)
Central Venous dialysis Catheter Insertion Procedure Note Caleb Singh [id number] 1956-07-31  Procedure: Insertion of Central Venous Catheter Indications: dialysis   Procedure Details Consent: Risks of procedure as well as the alternatives and risks of each were explained to the (patient/caregiver).  Consent for procedure obtained. Time Out: Verified patient identification, verified procedure, site/side was marked, verified correct patient position, special equipment/implants available, medications/allergies/relevent history reviewed, required imaging and test results available.  Performed Real time US was used to ID and cannulate vessel  Maximum sterile technique was used including antiseptics, cap, gloves, gown, hand hygiene, mask and sheet. Skin prep: Chlorhexidine; local anesthetic administered A antimicrobial bonded/coated triple lumen catheter was placed in the right internal jugular vein using the Seldinger technique.  Evaluation Blood flow good Complications: No apparent complications Patient did tolerate procedure well. Chest X-ray ordered to verify placement.  CXR: pending.  Caleb Singh 07/24/2017, 4:20 PM  Caleb Singh ACNP-BC [facility]/Critical [facility] Pager # [phone] OR # [phone] if no answer

## 2017-07-24 NOTE — Progress Notes (Signed)
CRITICAL VALUE ALERT  Critical Value:  Lactic Acid 3.3, Calcium 6.2  Date & Time Notied:  07/24/17 1400  Provider Notified: Ramaswammy  Orders Received/Actions taken: MD on unit gave verbal order for 1000 mL of lactated ringers. Order placed and admisnistered

## 2017-07-24 NOTE — Progress Notes (Signed)
Initial Nutrition Assessment  DOCUMENTATION CODES:   Not applicable  INTERVENTION:  - Will monitor for nutrition-related plan.    NUTRITION DIAGNOSIS:   Inadequate oral intake related to inability to eat as evidenced by NPO status.  GOAL:   Patient will meet greater than or equal to 90% of their needs  MONITOR:   Vent status, Weight trends, Labs  REASON FOR ASSESSMENT:   Ventilator  ASSESSMENT:   61 y.o. male with no significant past medical history complaining of severely worsening abdominal distention, pain, nonbloody, nonbilious, non-coffee-ground emesis and lack of bowel movements over the course of the last 3 days no BM in 48 hours, feels like he needs to deficate.  He denies prior abdominal surgeries, no fever or chills.  Patient is unsure if he is passing flatus.  He is never had any liver issues he does drink regularly but has never had any seizures or hallucinations when he does not drink, he has not had any alcohol in approximately 1 week.  He is never seen a gastroenterologist.   Pt seen for vent. BMI indicates overweight status. NGT to R nare with 1500cc dark green output in canister at this time. Pt is POD #1 ex lap, small bowel resection with anastomosis and placement of wound vac 2/2 perforated distal small bowel with questionable cecal ischemia. No family/visitors present to provide PTA information. Noted information from H&P, which is outlined above. Pt receiving 669 kcal from Propofol + D5 IVF.   Patient is currently intubated on ventilator support MV: 17.1 L/min Temp (24hrs), Avg:98.9 F (37.2 C), Min:98.4 F (36.9 C), Max:100 F (37.8 C) Propofol: 6.4 ml/hr (169 kcal) BP: 117/69 and MAP: 86  Medications reviewed; 1 mg IV folic acid/day, 50 mg Solu-cortef QID, 2 g IV Mg sulfate x1 run today, 40 mg IV Protonix/day, 10 mg subcutaneous vitamin K x1 dose yesterday, 100 mg IV thiamine/day.  Labs reviewed; Na: 133 mmol/L, BUN: 48 mg/dL, creatinine: 1.614.32 mg/dL,  Ca: 6.8 mg/dL, Mg: 1.2 mg/dL, GFR: 15 mL/min.   IVF: D5-150 mEq KCl @ 125 mL/hr (510 kcal) Drips: Fentanyl @ 125 mcg/hr, Levo @ 10 mcg/min, Propofol @ 12 mcg/min/kg.      NUTRITION - FOCUSED PHYSICAL EXAM:  Completed; no muscle or fat wasting noted, mild edema to all extremities.   Diet Order:  Diet NPO time specified  EDUCATION NEEDS:   No education needs have been identified at this time  Skin:  Skin Assessment: Skin Integrity Issues: Skin Integrity Issues:: Incisions Incisions: Abdominal on 12/4 with wound vac  Last BM:  PTA/unknown  Height:   Ht Readings from Last 1 Encounters:  07/23/17 5\' 10"  (1.778 m)    Weight:   Wt Readings from Last 1 Encounters:  07/23/17 175 lb (79.4 kg)    Ideal Body Weight:  75.45 kg  BMI:  Body mass index is 25.11 kg/m.  Estimated Nutritional Needs:   Kcal:  2166  Protein:  >/= 119 grams (1.5 grams/kg)  Fluid:  >2 L/day     Caleb GammonJessica Isidra Mings, MS, RD, LDN, [facility] Inpatient Clinical Dietitian Pager # [phone] After hours/weekend pager # [phone]

## 2017-07-24 NOTE — Progress Notes (Signed)
Pharmacy Antibiotic Note  Caleb Singh is a 61 y.o. male admitted on 07/23/2017 with sepsis d/t IAI, perforated bowel.  Pharmacy has been consulted for vancomycin and zosyn dosing. Anidulafungin also ordered by MD.  AKI. S/p exploratory lap, small bowel resection 12/4.  CRRT started tonight.  Plan: Adjust Zosyn extended infusion dosing to 3.375g IV q6h over 30 minute infusions while on CRRT. Anidulafungin needs no adjustment. Vancomycin d/c'd earlier today.  Height: 5\' 10"  (177.8 cm) Weight: 175 lb (79.4 kg) IBW/kg (Calculated) : 73  Temp (24hrs), Avg:100.1 F (37.8 C), Min:98.9 F (37.2 C), Max:102.5 F (39.2 C)  Recent Labs  Lab 07/23/17 1455 07/23/17 1504 07/23/17 1644 07/23/17 2058 07/24/17 0300 07/24/17 0350 07/24/17 0539 07/24/17 1232 07/24/17 1235 07/24/17 1721  WBC 9.1  --   --   --   --  8.8  --   --   --   --   CREATININE 4.05* 3.90*  --   --   --   --  4.32*  --  4.54*  --   LATICACIDVEN 10.20*  --  3.3* 3.5* 4.1*  --   --  3.3*  --  3.5*    Estimated Creatinine Clearance: 17.2 mL/min (A) (by C-G formula based on SCr of 4.54 mg/dL (H)).    No Known Allergies  Antimicrobials this admission: 12/4 Zosyn >>  12/4 Vancomycin >> 12/5 12/4 Eraxis >>  Dose adjustments this admission:  Microbiology results: 12/4 BCx:   Thank you for allowing pharmacy to be a part of this patient's care.  Clance BollRunyon, Salahuddin Arismendez 07/24/2017 8:08 PM

## 2017-07-24 NOTE — Care Management Note (Signed)
Case Management Note  Patient Details  Name: Caleb Singh MRN: [id number] Date of Birth: 02/10/1956  Subjective/Objective:                  Sedated and on vent. Decreased urine outpt.  Action/Plan: Date: July 24, 2017 Marcelle SmilingRhonda Coury Grieger, BSN, [place]3, [place] [phone] Chart and notes review for patient progress and needs. Will follow for case management and discharge needs. Next review date: [id number]  Expected Discharge Date:  (unknown)               Expected Discharge Plan:  Home/Self Care  In-House Referral:     Discharge planning Services  CM Consult  Post Acute Care Choice:    Choice offered to:     DME Arranged:    DME Agency:     HH Arranged:    HH Agency:     Status of Service:  In process, will continue to follow  If discussed at Long Length of Stay Meetings, dates discussed:    Additional Comments:  Golda AcreDavis, Trany Chernick Lynn, RN 07/24/2017, 9:10 AM

## 2017-07-24 NOTE — Progress Notes (Signed)
CRITICAL VALUE ALERT  Critical Value: Lactic acid 4.1  Date & Time Notied:  07/24/17 @ 3:00am   Provider Notified: Dr. Jamison NeighborNestor

## 2017-07-24 NOTE — Progress Notes (Signed)
Notified MD Signe ColtUpton in regards to CRRT orders signed and held for CRRT to be started. MD Signe ColtUpton ordered to release and start CRRT. Will complete and continue to monitor and assess.

## 2017-07-24 NOTE — Progress Notes (Signed)
CRITICAL VALUE ALERT  Critical Value:  Lactic acid 3.5  Date & Time Notied: 07/23/17 @9pm   Provider Notified: Dr. Jamison NeighborNestor

## 2017-07-24 NOTE — Progress Notes (Signed)
PULMONARY / CRITICAL CARE MEDICINE   Name: Caleb Singh MRN: [id number] DOB: Oct 12, 1955    ADMISSION DATE:  07/23/2017 CONSULTATION DATE: 07/23/2017   CHIEF COMPLAINT: Abdominal pain  HISTORY OF PRESENT ILLNESS:   This is an otherwise healthy 61 year old who began having severe abdominal pain on Sunday evening.  Onset was acute and the pain is remained localized to the midline anteriorly.  He has not had any pain radiating through to his back.  He has not had any bowel movement since Sunday morning.  He has had some nausea but no vomiting.  He has had no p.o. intake since that time.  Chest x-ray obtained in the emergency room shows free air.  He denies a history of prior abdominal surgeries.  Denies a history of weight loss or overt blood in the stool. Pertinent to his surgical risk, he has no known history of coronary artery disease, chest pain myocardial infarction, CHF, or arrhythmias.  He does have a very distant history of pulmonary embolism.  He gives no history consistent with chronic lung disease, he reports normal exercise tolerance and no difficulty with unusual dyspnea.  He has never had any significant exposure to anesthesia in the past his only surgical procedure was debridement of a finger on his right hand.  SUBJECTIVE:   Sedated on ventilator remains on norepinephrine. VITAL SIGNS: BP 101/72 (BP Location: Right Arm)   Pulse (!) 114   Temp 98.9 F (37.2 C) (Axillary)   Resp (!) 30   Ht 5\' 10"  (1.778 m)   Wt 175 lb (79.4 kg)   SpO2 98%   BMI 25.11 kg/m   HEMODYNAMICS:    VENTILATOR SETTINGS: Vent Mode: PRVC FiO2 (%):  [60 %] 60 % Set Rate:  [30 bmp] 30 bmp Vt Set:  [420 mL-580 mL] 580 mL PEEP:  [5 cmH20] 5 cmH20 Plateau Pressure:  [15 cmH20-21 cmH20] 21 cmH20  INTAKE / OUTPUT:  Intake/Output Summary (Last 24 hours) at 07/24/2017 [id number] Last data filed at 07/24/2017 0700 Gross per 24 hour  Intake 6814.88 ml  Output 1020 ml  Net 5794.88 ml     PHYSICAL  EXAMINATION: General: 61 year old African-American male currently sedated on the ventilator HEENT: Normocephalic atraumatic orally intubated no jugular venous distention mucous membranes are moist Pulmonary: Clear to auscultation with equal chest rise no accessory muscle use Cardiac: Tachycardic, regular rate and rhythm without murmur rub or gallop. Extremities/musculoskeletal: Warm, dry, no significant edema, brisk capillary refill strong pulses Abdomen: Wound VAC intact.  Grimaces to palpation, hypoactive bowel sounds Neuro: Sedated, moves all extremities   LABS:  BMET Recent Labs  Lab 07/23/17 1455 07/23/17 1504 07/24/17 0539  NA 133* 132* 133*  K 5.6* 5.4* 5.1  CL 96* 101 108  CO2 17*  --  14*  BUN 42* 55* 48*  CREATININE 4.05* 3.90* 4.32*  GLUCOSE 112* 111* 106*    Electrolytes Recent Labs  Lab 07/23/17 1455 07/24/17 0539  CALCIUM 8.8* 6.8*  MG  --  1.2*  PHOS  --  3.3    CBC Recent Labs  Lab 07/23/17 1455 07/23/17 1504 07/24/17 0350  WBC 9.1  --  8.8  HGB 18.1* 18.7* 14.5  HCT 51.6 55.0* 41.7  PLT 283  --  226    Coag's Recent Labs  Lab 07/23/17 1455 07/24/17 0350  INR 1.78 1.77    Sepsis Markers Recent Labs  Lab 07/23/17 1644 07/23/17 2058 07/24/17 0300 07/24/17 0539  LATICACIDVEN 3.3* 3.5* 4.1*  --  PROCALCITON 124.45  --   --  >150.00    ABG Recent Labs  Lab 07/23/17 2100 07/24/17 0500  PHART 7.295* 7.368  PCO2ART 39.7 26.8*  PO2ART 90.2 188*    Liver Enzymes Recent Labs  Lab 07/23/17 1455  AST 47*  ALT 13*  ALKPHOS 78  BILITOT 1.8*  ALBUMIN 3.3*    Cardiac Enzymes No results for input(s): TROPONINI, PROBNP in the last 168 hours.  Glucose No results for input(s): GLUCAP in the last 168 hours.  Imaging Ct Abdomen Pelvis Wo Contrast  Result Date: 07/23/2017 CLINICAL DATA:  Severe abdominal pain and distention. EXAM: CT ABDOMEN AND PELVIS WITHOUT CONTRAST TECHNIQUE: Multidetector CT imaging of the abdomen and  pelvis was performed following the standard protocol without IV contrast. COMPARISON:  None. FINDINGS: Lower chest: Normal heart size without pericardial effusion. Small bilateral pleural effusions with adjacent atelectasis. Hepatobiliary: Branching air like lucencies in the left hepatic lobe, series 2 image 29 and coronal series 5 image 37 concerning for portal venous gas. No space-occupying mass of the liver. The gallbladder is physiologically distended. Pancreas: Normal pancreas without ductal dilatation or mass on this unenhanced study. Spleen: Normal size spleen. Adrenals/Urinary Tract: Normal bilateral adrenal glands. 2.8 cm right interpolar posterior complex cyst with thin septation. Hyperdense left lower pole 0.8 cm lesion likely hemorrhagic or proteinaceous cyst. Decompressed urinary bladder. Stomach/Bowel: Fluid-filled distention of the stomach. The contours of the stomach appear intact without apparent ulceration nor perforation. Fluid-filled distention of the second through fourth portions of the duodenum without perforation nor retroperitoneal emphysema. Moderate fluid-filled distention of jejunal loops with pneumatosis. Serpiginous extraluminal air emanating off a segment of small intestine in the right hemiabdomen, series 2, image 51 might represent a potential site for small bowel perforation. The colon is unremarkable apart from scattered colonic diverticulosis. Vascular/Lymphatic: Mild aortoiliac atherosclerosis without aneurysm. No lymphadenopathy. Reproductive: Prostate is normal in size. Other: Moderate to marked amount of free intraperitoneal air admixed with free fluid, subdiaphragmatic, perihepatic and perisplenic splenic in location extending along both paracolic gutters into the pelvis. Small bowel containing right inguinal hernia with fluid containing left inguinal hernia. Musculoskeletal: Thoracolumbar spondylosis. IMPRESSION: 1. Large amount of free intraperitoneal air and fluid  consistent with perforated hollow viscus likely involving small intestine with possible source in the right hemiabdomen, series 2, image 51. Critical Value/emergent results were called by telephone at the time of interpretation on 07/23/2017 at 4:59 pm to PA [facility] , who verbally acknowledged these results. 2. Abnormal bowel gas pattern concerning for small bowel pneumatosis with small amount of portal venous gas in the left hepatic lobe. This raises concern for ischemic bowel. 3. Bilateral inguinal hernias on the right containing a short segment of small intestine and on the left containing fluid. These are reportedly reducible clinically. 4. Complex bilateral renal cysts. Electronically Signed   By: Tollie Eth M.D.   On: 07/23/2017 17:03   Dg Chest Port 1 View  Result Date: 07/24/2017 CLINICAL DATA:  Respiratory failure EXAM: PORTABLE CHEST 1 VIEW COMPARISON:  11/25/2008 FINDINGS: Endotracheal tube, left subclavian central line and nasogastric catheter are again seen and stable. Cardiac shadow is stable. Mild bibasilar atelectasis is noted. No sizable effusion or pneumothorax is seen. IMPRESSION: Tubes and lines as described above. Mild bibasilar atelectatic changes. Electronically Signed   By: Alcide Clever M.D.   On: 07/24/2017 07:02   Dg Chest Port 1 View  Result Date: 07/23/2017 CLINICAL DATA:  Endotracheal tube placement. EXAM: PORTABLE CHEST 1  VIEW COMPARISON:  Chest x-ray from same day at 16:38. FINDINGS: Interval placement of an endotracheal tube with the tip approximately 3.6 cm above the level of the carina. Interval placement of an enteric tube entering the stomach with the tip and distal side port beyond the field of view. Unchanged left subclavian central venous catheter with the tip at the cavoatrial junction. The left costophrenic angle is not included in the field of view. The cardiomediastinal silhouette is normal in size. Normal pulmonary vascularity. Low lung volumes are  present, causing crowding of the pulmonary vasculature. No focal consolidation, pleural effusion, or pneumothorax. No acute osseous abnormality. IMPRESSION: 1. Appropriately positioned endotracheal tube. Enteric tube entering the stomach with the tip beyond the field of view. 2.  No active cardiopulmonary disease. Electronically Signed   By: Obie Dredge M.D.   On: 07/23/2017 20:53   Dg Chest Portable 1 View  Result Date: 07/23/2017 CLINICAL DATA:  Status post central line placement EXAM: PORTABLE CHEST 1 VIEW COMPARISON:  None. FINDINGS: There is a left subclavian central venous catheter with the tip projecting over the SVC. There is no focal parenchymal opacity. There is no pleural effusion or pneumothorax. The heart and mediastinal contours are unremarkable. The osseous structures are unremarkable. Pneumoperitoneum is not well delineated on the current x-ray. IMPRESSION: 1. Left subclavian central venous catheter with the tip projecting over the SVC. 2. Pneumoperitoneum is not well delineated on the current x-ray. Please refer to CT of abdomen for further characterization. Electronically Signed   By: Elige Ko   On: 07/23/2017 16:56   Dg Abd Acute W/chest  Result Date: 07/23/2017 CLINICAL DATA:  Severe abdominal pain and distention, tachycardia, shortness of breath EXAM: DG ABDOMEN ACUTE W/ 1V CHEST COMPARISON:  None FINDINGS: Normal heart size, mediastinal contours and pulmonary vascularity. Mild bibasilar atelectasis. Lungs otherwise clear. No infiltrate, pleural effusion or pneumothorax. Free intraperitoneal air consistent with perforated viscus. Nonobstructive bowel gas pattern. No bowel dilatation or bowel wall thickening. Degenerative changes of the spine. No urinary tract calcifications. IMPRESSION: Free intraperitoneal air consistent with perforated viscus. Mild bibasilar atelectasis. Critical Value/emergent results were called by telephone at the time of interpretation on 07/23/2017 at 2:55  pm to [facility] PA, who verbally acknowledged these results. Electronically Signed   By: Ulyses Southward M.D.   On: 07/23/2017 14:56   Dg Abd Portable 1v  Result Date: 07/23/2017 CLINICAL DATA:  Incorrect sponge count. EXAM: PORTABLE ABDOMEN - 1 VIEW COMPARISON:  Radiographs of same day. FINDINGS: Distal tip of nasogastric tube is seen in expected position of distal stomach. Mildly dilated small bowel loops are noted which may represent ileus. No radiopaque foreign body is noted. IMPRESSION: Nasogastric tube tip seen in expected position of distal stomach. No radiopaque foreign body is noted. Mildly dilated small bowel loops are noted which may represent ileus. Electronically Signed   By: Lupita Raider, M.D.   On: 07/23/2017 20:17      ASSESSMENT / PLAN:   Perforated viscus involving the distal small bowel s/p exploratory laparotomy with small bowel resection and placement of abdominal wound VAC; no anastomosis made -felt d/t diverticular disease  Plan Wound VAC per surgery. Will eventually return to operating room for washout and reevaluation plan for 12/6 tentatively NPO Day # 2 anidulafundin, zosyn, and vanc  Septic shock secondary to peritonitis  Plan CVP goal 8-12 MAP goal >65 Fluids and pressors as indicated F/u cortisol Stress dose steroids  Ventilator dependence status post abdominal surgery  Mild iatrogenic respiratory alkalosis -PCXR: Personally reviewed, endotracheal tube, left subclavian triple-lumen catheter and nasogastric tube not seem to be in satisfactory position.  Low lung volume film.  Has left basilar volume loss as well as what appears to be some degree of right basilar atelectasis.  No significant change when compared to prior film  Plan Full vent support PAD protocol (RASS goal -2 to -3) VAP prevention  F/u am CXR  Acute kidney injury with progressive NAG metabolic acidosis & hyperkalemia  Plan Dc LR Start bicarb gtt Serial chemistry; depending on  next ABG/chemistry may need nephrology assistance Strict I&O Renal dose meds  Fluid and electrolyte abnormality: Hypomagnesemia  Plan Replace Mg Repeat level in am   H/o ETOH abuse @ risk for w/d and acute delirium Plan Add thiamine and folate PAD protocol  Mildly elevated INR Plan F/u LFTs  DVT prophylaxis: PAS SUP: PPI bid  Diet: NPO Activity: Bedrest Disposition :ICU  Is critically ill 45 minutes critical care time dedicated to his care  Simonne MartinetPeter E Jaysie Benthall ACNP-BC [facility] Pul[facility]/Critical Care Pager # [phone] OR # [phone] if no answer  07/24/2017, 8:02 AM

## 2017-07-24 NOTE — Progress Notes (Signed)
CRITICAL VALUE ALERT  Critical Value:  Lactic 3.5  Date & Time Notied:  07/24/17  Provider Notified: Ramaswammy  Orders Received/Actions taken: Pt being started on CRRT to obtain more lab work after CRRT initiation

## 2017-07-24 NOTE — Progress Notes (Signed)
1 Day Post-Op    ZO:[id number]:abdominal pain  Subjective: Patient is sedated on the ventilator.  He has a wound VAC in place.  He is on a single pressor currently.  Very minimal urine output.  Objective: Vital signs in last 24 hours: Temp:  [98.4 F (36.9 C)-98.9 F (37.2 C)] 98.9 F (37.2 C) (12/04 2318) Pulse Rate:  [54-145] 114 (12/05 0500) Resp:  [17-54] 30 (12/05 0700) BP: (101-213)/(52-142) 101/72 (12/05 0400) SpO2:  [94 %-100 %] 98 % (12/05 0500) Arterial Line BP: (60-252)/(39-111) 85/59 (12/05 0700) FiO2 (%):  [60 %] 60 % (12/05 0400) Weight:  [79.4 kg (175 lb)] 79.4 kg (175 lb) (12/04 1444)  6814 IV 470 urine Drain 450 Blood 100 Afebrile, Axillary temps Tachycardic, BP supported with pressors, currently just on Neosnyphrine On Vent - CCM WBC 8.8 H/H 14.5/41.7 INR 1.77 CXR this AM:  Endotracheal tube, left subclavian central line and nasogastric catheter are again seen and stable. Cardiac shadow is stable. Mild bibasilar atelectasis is noted. No sizable effusion or pneumothorax is seen. Intake/Output from previous day: 12/04 0701 - 12/05 0700 In: 6814.9 [I.V.:6714.9; IV Piggyback:100] Out: 1020 [Urine:470; Drains:450; Blood:100] Intake/Output this shift: No intake/output data recorded.  General appearance: Sedated on the vent.   Resp: clear to auscultation bilaterally and antgerior exam Cardio: Sinus tachycardia GI: Wound VAC in place no bowel sounds. Extremities: No edema  Lab Results:  Recent Labs    07/23/17 1455 07/23/17 1504 07/24/17 0350  WBC 9.1  --  8.8  HGB 18.1* 18.7* 14.5  HCT 51.6 55.0* 41.7  PLT 283  --  226    BMET Recent Labs    07/23/17 1455 07/23/17 1504  NA 133* 132*  K 5.6* 5.4*  CL 96* 101  CO2 17*  --   GLUCOSE 112* 111*  BUN 42* 55*  CREATININE 4.05* 3.90*  CALCIUM 8.8*  --    PT/INR Recent Labs    07/23/17 1455 07/24/17 0350  LABPROT 20.5* 20.5*  INR 1.78 1.77    Recent Labs  Lab 07/23/17 1455  AST 47*  ALT 13*   ALKPHOS 78  BILITOT 1.8*  PROT 7.2  ALBUMIN 3.3*     Lipase     Component Value Date/Time   LIPASE 47 07/23/2017 1455     Medications: . heparin  5,000 Units Subcutaneous Q8H  . pantoprazole (PROTONIX) IV  40 mg Intravenous Q24H   . anidulafungin    . fentaNYL infusion INTRAVENOUS 125 mcg/hr (07/24/17 0700)  . lactated ringers 125 mL/hr at 07/24/17 0700  . norepinephrine (LEVOPHED) Adult infusion 12.053 mcg/min (07/24/17 0700)  . phenylephrine (NEO-SYNEPHRINE) Adult infusion Stopped (07/24/17 0649)  . piperacillin-tazobactam (ZOSYN)  IV 3.375 g (07/24/17 0649)  . propofol (DIPRIVAN) infusion 10.915 mcg/kg/min (07/24/17 0700)  . [START ON 07/25/2017] vancomycin     Anti-infectives (From admission, onward)   Start     Dose/Rate Route Frequency Ordered Stop   07/25/17 2000  vancomycin (VANCOCIN) IVPB 1000 mg/200 mL premix     1,000 mg 200 mL/hr over 60 Minutes Intravenous Every 48 hours 07/23/17 2042     07/24/17 2100  anidulafungin (ERAXIS) 100 mg in sodium chloride 0.9 % 100 mL IVPB     100 mg 78 mL/hr over 100 Minutes Intravenous Every 24 hours 07/23/17 2039     07/24/17 0000  anidulafungin (ERAXIS) 100 mg in sodium chloride 0.9 % 100 mL IVPB  Status:  Discontinued     100 mg 78 mL/hr over 100 Minutes  Intravenous Every 24 hours 07/23/17 2038 07/23/17 2039   07/23/17 2200  piperacillin-tazobactam (ZOSYN) IVPB 3.375 g     3.375 g 12.5 mL/hr over 240 Minutes Intravenous Every 8 hours 07/23/17 2043     07/23/17 2100  anidulafungin (ERAXIS) 200 mg in sodium chloride 0.9 % 200 mL IVPB     200 mg 78 mL/hr over 200 Minutes Intravenous  Once 07/23/17 2038 07/24/17 0239   07/23/17 2100  vancomycin (VANCOCIN) 1,500 mg in sodium chloride 0.9 % 500 mL IVPB     1,500 mg 250 mL/hr over 120 Minutes Intravenous  Once 07/23/17 2041 07/23/17 2317   07/23/17 1515  piperacillin-tazobactam (ZOSYN) IVPB 3.375 g     3.375 g 100 mL/hr over 30 Minutes Intravenous  Once 07/23/17 1511 07/23/17  1611     Assessment/Plan Perforated distal small bowel with questionable cecal ischemia A/P oratory laparotomy, small bowel resection without anastomosis, placement of open wound VAC system 07/23/17 Dr. Minerva AreolaEric Wilson/Dr. Claud KelpHaywood Ingram  Sepsis/shock -levophed and Neo-Synephrine last PM down to Neo this AM Gram Negative Bacteremia - Zosyn Acute respiratory failure - Ventilator per CCM Acute renal failure - creatinine trending up, very little urine in foley bag/tubing Hyponatremia/K+ is elevated - IV fluids LR - defer to CCM on fluids History of heavy alcohol use - sedated on ventilatior History of tobacco use  FEN:  IV fluids ID: Zosyn 07/23/17 =>> day 2, Vancomycin 07/23/17 =>> day 2,  anidulafungin 07/23/17 =>> day 2 DVT: SCDs Foley: In place - Do not remove order in place Follow-up: Dr. Gaynelle AduEric Wilson  Plan: Continue supportive medical management.  INR is slightly elevated, I have added LFTs to monitor his liver function.  Prealbumin is also been added.  To monitor nutritional status.  Continue ventilator support for now.  I anticipate he will go back to the OR tomorrow for a washout and reevaluation.  Appreciate CCM support and current management by their service. Discussed Blood culture results, and we will stop vancomycin for now.    LOS: 1 day    Aveline Daus 07/24/2017 [phone]

## 2017-07-24 NOTE — Progress Notes (Signed)
eLink Physician-Brief Progress Note Patient Name: Caleb Singh DOB: [birth year] MRN: [id number]   Date of Service  07/24/2017  HPI/Events of Note  Notified of calcium 5.7. Serum albumin 1.7. Currently on CVVHD.   eICU Interventions  Calcium gluconate 1 amp IV ordered      Intervention Category Intermediate Interventions: Electrolyte abnormality - evaluation and management  [name] 07/24/2017, 9:46 PM

## 2017-07-24 NOTE — Progress Notes (Signed)
PHARMACY - PHYSICIAN COMMUNICATION CRITICAL VALUE ALERT - BLOOD CULTURE IDENTIFICATION (BCID)  Caleb Singh is an 61 y.o. male [facility] with a chief complaint of abdominal pain.   Assessment: He was found to have a perforated distal small bowel and is s/p laparotomy, small bowel resection with placement of open wound VAC 12/4.  Name of physician (or Provider) Contacted: Will Marlyne BeardsJennings, PA-C  Current antibiotics: Vancomycin, Zosyn, Eraxis  Changes to prescribed antibiotics recommended:  D/C vancomycin; continue Zosyn and Eraxis since infection may be polymicrobial  Results for orders placed or performed during the hospital encounter of 07/23/17  Blood Culture ID Panel (Reflexed) (Collected: 07/23/2017  3:40 PM)  Result Value Ref Range   Enterococcus species NOT DETECTED NOT DETECTED   Listeria monocytogenes NOT DETECTED NOT DETECTED   Staphylococcus species NOT DETECTED NOT DETECTED   Staphylococcus aureus NOT DETECTED NOT DETECTED   Streptococcus species NOT DETECTED NOT DETECTED   Streptococcus agalactiae NOT DETECTED NOT DETECTED   Streptococcus pneumoniae NOT DETECTED NOT DETECTED   Streptococcus pyogenes NOT DETECTED NOT DETECTED   Acinetobacter baumannii NOT DETECTED NOT DETECTED   Enterobacteriaceae species DETECTED (A) NOT DETECTED   Enterobacter cloacae complex NOT DETECTED NOT DETECTED   Escherichia coli DETECTED (A) NOT DETECTED   Klebsiella oxytoca NOT DETECTED NOT DETECTED   Klebsiella pneumoniae NOT DETECTED NOT DETECTED   Proteus species NOT DETECTED NOT DETECTED   Serratia marcescens NOT DETECTED NOT DETECTED   Carbapenem resistance NOT DETECTED NOT DETECTED   Haemophilus influenzae NOT DETECTED NOT DETECTED   Neisseria meningitidis NOT DETECTED NOT DETECTED   Pseudomonas aeruginosa NOT DETECTED NOT DETECTED   Candida albicans NOT DETECTED NOT DETECTED   Candida glabrata NOT DETECTED NOT DETECTED   Candida krusei NOT DETECTED  NOT DETECTED   Candida parapsilosis NOT DETECTED NOT DETECTED   Candida tropicalis NOT DETECTED NOT DETECTED    Loralee PacasErin Acea Yagi, PharmD, BCPS Pager: [phone] 07/24/2017  9:52 AM

## 2017-07-24 NOTE — Anesthesia Preprocedure Evaluation (Addendum)
Anesthesia Evaluation  Patient identified by MRN, date of birth, ID band Patient unresponsive    Reviewed: Allergy & Precautions, H&P , NPO status , Patient's Chart, lab work & pertinent test results  History of Anesthesia Complications Negative for: history of anesthetic complications  Airway Mallampati: Intubated       Dental no notable dental hx.    Pulmonary former smoker,  Remains intubated from 12/4 surgery   breath sounds clear to auscultation       Cardiovascular negative cardio ROS Normal cardiovascular exam     Neuro/Psych negative neurological ROS  negative psych ROS   GI/Hepatic Neg liver ROS, S/p lap on 12/4, septic and may have ischemic bowel.  For second lap.   Endo/Other  negative endocrine ROS  Renal/GU ARFRenal diseaseOliguric AKI, severe     Musculoskeletal   Abdominal   Peds  Hematology   Anesthesia Other Findings   Reproductive/Obstetrics                             Anesthesia Physical  Anesthesia Plan  ASA: IV  Anesthesia Plan: General   Post-op Pain Management:    Induction: Intravenous  PONV Risk Score and Plan: 2 and Treatment may vary due to age or medical condition, Ondansetron and Dexamethasone  Airway Management Planned: Oral ETT  Additional Equipment:   Intra-op Plan:   Post-operative Plan: Post-operative intubation/ventilation  Informed Consent: I have reviewed the patients History and Physical, chart, labs and discussed the procedure including the risks, benefits and alternatives for the proposed anesthesia with the patient or authorized representative who has indicated his/her understanding and acceptance.     Plan Discussed with: Anesthesiologist, CRNA and Surgeon  Anesthesia Plan Comments: (  )        Anesthesia Quick Evaluation  

## 2017-07-24 NOTE — Progress Notes (Addendum)
CRITICAL VALUE ALERT  Critical Value:  Calcium 5.7   Date & Time Notied:  2130  Provider Notified: Elink   Orders Received/Actions taken: Orders received- Calcium Gluconate to be given

## 2017-07-24 NOTE — Consult Note (Signed)
[facility] Consult Note     Date: 07/24/2017                  Patient Name:  Caleb Singh  MRN: [id number]  DOB: [birth year]  Age / Sex: 61 y.o., male         PCP: Patient, No Pcp Per                 Service Requesting Consult: PCCM Kary Kos NP and Chase Caller MD                 Reason for Consult: AKI             Chief Complaint: abd pain  HPI: Pt is a 1M with a PMH of remote PE who is now seen in consultation at the request of PCCM for AKI.  Briefly, pt presented with severe abd pain Sunday night.  Xray showed free air as did CT with ? Small bowel pneumatosis and he was taken to the OR for emergent ex-lap.  Was found to have distal small bowel perf and questionable cecal ischemia.  Left in discontinuity with open abd.   Intubated for acute hypoxic RF.   He is in shock requiring pressors and has had a falling urine output.  Last known Cr 04/2016 1.11, now up to 4.5  K 4.9, Co2 18.     On stress dose steroids, vanc/ zosyn, and eraxis.  Most recent CVP 8.  Blood cultures + 12/4 with E coli.  Going to OR tomorrow for second look.   History reviewed. No pertinent past medical history.  Past Surgical History:  Procedure Laterality Date  . I&D EXTREMITY Right 04/17/2016   Procedure: IRRIGATION AND DEBRIDEMENT RIGHT HAND;  Surgeon: Roseanne Kaufman, MD;  Location: [facility];  Service: Orthopedics;  Laterality: Right;  . I&D EXTREMITY Right 04/19/2016   Procedure: REPEAT I&D RIGHT HAND;  Surgeon: Roseanne Kaufman, MD;  Location: [facility];  Service: Orthopedics;  Laterality: Right;  . LAPAROTOMY N/A 07/23/2017   Procedure: EXPLORATORY LAPAROTOMY, SMALL BOWEL RESECTION;  Surgeon: Greer Pickerel, MD;  Location: [facility];  Service: General;  Laterality: N/A;    History reviewed. No pertinent family history. Social History:  reports that he has quit smoking. he has never used smokeless tobacco. He reports that he does not drink alcohol or use drugs.  Allergies: No Known  Allergies  Medications Prior to Admission  Medication Sig Dispense Refill  . clindamycin (CLEOCIN) 300 MG capsule Take 1 capsule (300 mg total) by mouth 3 (three) times daily. (Patient not taking: Reported on 07/23/2017) 42 capsule 0  . oxyCODONE (OXY IR/ROXICODONE) 5 MG immediate release tablet Take 1-2 tablets (5-10 mg total) by mouth every 3 (three) hours as needed for moderate pain. (Patient not taking: Reported on 07/23/2017) 40 tablet 0    Results for orders placed or performed during the hospital encounter of 07/23/17 (from the past 48 hour(s))  I-Stat CG4 Lactic Acid, ED     Status: Abnormal   Collection Time: 07/23/17  2:55 PM  Result Value Ref Range   Lactic Acid, Venous 10.20 (HH) 0.5 - 1.9 mmol/L   Comment NOTIFIED PHYSICIAN   CBC with Differential     Status: Abnormal   Collection Time: 07/23/17  2:55 PM  Result Value Ref Range   WBC 9.1 4.0 - 10.5 K/uL   RBC 5.56 4.22 - 5.81 MIL/uL   Hemoglobin 18.1 (H) 13.0 - 17.0 g/dL   HCT 51.6 39.0 -  52.0 %   MCV 92.8 78.0 - 100.0 fL   MCH 32.6 26.0 - 34.0 pg   MCHC 35.1 30.0 - 36.0 g/dL   RDW 12.6 11.5 - 15.5 %   Platelets 283 150 - 400 K/uL   Neutrophils Relative % 82 %   Lymphocytes Relative 12 %   Monocytes Relative 4 %   Eosinophils Relative 2 %   Basophils Relative 0 %   Neutro Abs 7.4 1.7 - 7.7 K/uL   Lymphs Abs 1.1 0.7 - 4.0 K/uL   Monocytes Absolute 0.4 0.1 - 1.0 K/uL   Eosinophils Absolute 0.2 0.0 - 0.7 K/uL   Basophils Absolute 0.0 0.0 - 0.1 K/uL   WBC Morphology INCREASED BANDS (>20% BANDS)     Comment: VACUOLATED NEUTROPHILS  Comprehensive metabolic panel     Status: Abnormal   Collection Time: 07/23/17  2:55 PM  Result Value Ref Range   Sodium 133 (L) 135 - 145 mmol/L   Potassium 5.6 (H) 3.5 - 5.1 mmol/L   Chloride 96 (L) 101 - 111 mmol/L   CO2 17 (L) 22 - 32 mmol/L   Glucose, Bld 112 (H) 65 - 99 mg/dL   BUN 42 (H) 6 - 20 mg/dL   Creatinine, Ser 4.05 (H) 0.61 - 1.24 mg/dL   Calcium 8.8 (L) 8.9 - 10.3 mg/dL    Total Protein 7.2 6.5 - 8.1 g/dL   Albumin 3.3 (L) 3.5 - 5.0 g/dL   AST 47 (H) 15 - 41 U/L   ALT 13 (L) 17 - 63 U/L   Alkaline Phosphatase 78 38 - 126 U/L   Total Bilirubin 1.8 (H) 0.3 - 1.2 mg/dL   GFR calc non Af Amer 14 (L) >60 mL/min   GFR calc Af Amer 17 (L) >60 mL/min    Comment: (NOTE) The eGFR has been calculated using the CKD EPI equation. This calculation has not been validated in all clinical situations. eGFR's persistently <60 mL/min signify possible Chronic Kidney Disease.    Anion gap 20 (H) 5 - 15  Lipase, blood     Status: None   Collection Time: 07/23/17  2:55 PM  Result Value Ref Range   Lipase 47 11 - 51 U/L  Protime-INR     Status: Abnormal   Collection Time: 07/23/17  2:55 PM  Result Value Ref Range   Prothrombin Time 20.5 (H) 11.4 - 15.2 seconds   INR 1.78   Blood culture (routine x 2)     Status: None (Preliminary result)   Collection Time: 07/23/17  3:01 PM  Result Value Ref Range   Specimen Description BLOOD RIGHT ANTECUBITAL    Special Requests      BOTTLES DRAWN AEROBIC AND ANAEROBIC Blood Culture results may not be optimal due to an inadequate volume of blood received in culture bottles   Culture      NO GROWTH < 24 HOURS Performed at [facility] [address] [place], [place] [address]    Report Status PENDING   Type and screen     Status: None   Collection Time: 07/23/17  3:01 PM  Result Value Ref Range   ABO/RH(D) O POS    Antibody Screen NEG    Sample Expiration 07/26/2017   ABO/Rh     Status: None   Collection Time: 07/23/17  3:01 PM  Result Value Ref Range   ABO/RH(D) O POS   I-Stat Chem 8, ED     Status: Abnormal   Collection Time:  07/23/17  3:04 PM  Result Value Ref Range   Sodium 132 (L) 135 - 145 mmol/L   Potassium 5.4 (H) 3.5 - 5.1 mmol/L   Chloride 101 101 - 111 mmol/L   BUN 55 (H) 6 - 20 mg/dL   Creatinine, Ser 3.90 (H) 0.61 - 1.24 mg/dL   Glucose, Bld 111 (H) 65 - 99 mg/dL   Calcium, Ion 1.03 (L) 1.15 - 1.40  mmol/L   TCO2 19 (L) 22 - 32 mmol/L   Hemoglobin 18.7 (H) 13.0 - 17.0 g/dL   HCT 55.0 (H) 39.0 - 52.0 %  Blood culture (routine x 2)     Status: None (Preliminary result)   Collection Time: 07/23/17  3:40 PM  Result Value Ref Range   Specimen Description BLOOD LEFT WRIST    Special Requests IN PEDIATRIC BOTTLE Blood Culture adequate volume    Culture  Setup Time      GRAM NEGATIVE RODS IN PEDIATRIC BOTTLE Organism ID to follow CRITICAL RESULT CALLED TO, READ BACK BY AND VERIFIED WITH: Christean Grief Pharm.D. 9:35 07/24/17 ([email or web address])    Culture GRAM NEGATIVE RODS    Report Status PENDING   Blood Culture ID Panel (Reflexed)     Status: Abnormal   Collection Time: 07/23/17  3:40 PM  Result Value Ref Range   Enterococcus species NOT DETECTED NOT DETECTED   Listeria monocytogenes NOT DETECTED NOT DETECTED   Staphylococcus species NOT DETECTED NOT DETECTED   Staphylococcus aureus NOT DETECTED NOT DETECTED   Streptococcus species NOT DETECTED NOT DETECTED   Streptococcus agalactiae NOT DETECTED NOT DETECTED   Streptococcus pneumoniae NOT DETECTED NOT DETECTED   Streptococcus pyogenes NOT DETECTED NOT DETECTED   Acinetobacter baumannii NOT DETECTED NOT DETECTED   Enterobacteriaceae species DETECTED (A) NOT DETECTED    Comment: Enterobacteriaceae represent a large family of gram-negative bacteria, not a single organism. CRITICAL RESULT CALLED TO, READ BACK BY AND VERIFIED WITH: Christean Grief Pharm.D. 9:35 07/24/17 (wilsonm)    Enterobacter cloacae complex NOT DETECTED NOT DETECTED   Escherichia coli DETECTED (A) NOT DETECTED    Comment: CRITICAL RESULT CALLED TO, READ BACK BY AND VERIFIED WITH: Mila Merry.D. 9:35 07/24/17 (wilsonm)    Klebsiella oxytoca NOT DETECTED NOT DETECTED   Klebsiella pneumoniae NOT DETECTED NOT DETECTED   Proteus species NOT DETECTED NOT DETECTED   Serratia marcescens NOT DETECTED NOT DETECTED   Carbapenem resistance NOT DETECTED NOT DETECTED   Haemophilus influenzae  NOT DETECTED NOT DETECTED   Neisseria meningitidis NOT DETECTED NOT DETECTED   Pseudomonas aeruginosa NOT DETECTED NOT DETECTED   Candida albicans NOT DETECTED NOT DETECTED   Candida glabrata NOT DETECTED NOT DETECTED   Candida krusei NOT DETECTED NOT DETECTED   Candida parapsilosis NOT DETECTED NOT DETECTED   Candida tropicalis NOT DETECTED NOT DETECTED    Comment: Performed at [facility], [place] [address] [name], [place] [address]  Procalcitonin - Baseline     Status: None   Collection Time: 07/23/17  4:44 PM  Result Value Ref Range   Procalcitonin 124.45 ng/mL    Comment:        Interpretation: PCT >= 10 ng/mL: Important systemic inflammatory response, almost exclusively due to severe bacterial sepsis or septic shock. (NOTE)       Sepsis PCT Algorithm           Lower Respiratory Tract  Infection PCT Algorithm    ----------------------------     ----------------------------         PCT < 0.25 ng/mL                PCT < 0.10 ng/mL         Strongly encourage             Strongly discourage   discontinuation of antibiotics    initiation of antibiotics    ----------------------------     -----------------------------       PCT 0.25 - 0.50 ng/mL            PCT 0.10 - 0.25 ng/mL               OR       >80% decrease in PCT            Discourage initiation of                                            antibiotics      Encourage discontinuation           of antibiotics    ----------------------------     -----------------------------         PCT >= 0.50 ng/mL              PCT 0.26 - 0.50 ng/mL                AND       <80% decrease in PCT             Encourage initiation of                                             antibiotics       Encourage continuation           of antibiotics    ----------------------------     -----------------------------        PCT >= 0.50 ng/mL                  PCT > 0.50 ng/mL               AND          increase in PCT                  Strongly encourage                                      initiation of antibiotics    Strongly encourage escalation           of antibiotics                                     -----------------------------                                           PCT <= 0.25 ng/mL  OR                                        > 80% decrease in PCT                                     Discontinue / Do not initiate                                             antibiotics   Lactic acid, plasma     Status: Abnormal   Collection Time: 07/23/17  4:44 PM  Result Value Ref Range   Lactic Acid, Venous 3.3 (HH) 0.5 - 1.9 mmol/L    Comment: CRITICAL RESULT CALLED TO, READ BACK BY AND VERIFIED WITH: LEAK,B. RN '@1808'$  ON 12.04.18 BY COHEN,K   I-STAT 7, (LYTES, BLD GAS, ICA, H+H)     Status: Abnormal   Collection Time: 07/23/17  6:23 PM  Result Value Ref Range   pH, Arterial 7.202 (L) 7.350 - 7.450   pCO2 arterial 52.8 (H) 32.0 - 48.0 mmHg   pO2, Arterial 323.0 (H) 83.0 - 108.0 mmHg   Bicarbonate 20.5 20.0 - 28.0 mmol/L   TCO2 22 22 - 32 mmol/L   O2 Saturation 100.0 %   Acid-base deficit 8.0 (H) 0.0 - 2.0 mmol/L   Sodium 135 135 - 145 mmol/L   Potassium 4.6 3.5 - 5.1 mmol/L   Calcium, Ion 1.09 (L) 1.15 - 1.40 mmol/L   HCT 42.0 39.0 - 52.0 %   Hemoglobin 14.3 13.0 - 17.0 g/dL   Patient temperature 38.1 C    Sample type ARTERIAL   I-STAT 7, (LYTES, BLD GAS, ICA, H+H)     Status: Abnormal   Collection Time: 07/23/17  7:14 PM  Result Value Ref Range   pH, Arterial 7.220 (L) 7.350 - 7.450   pCO2 arterial 41.4 32.0 - 48.0 mmHg   pO2, Arterial 219.0 (H) 83.0 - 108.0 mmHg   Bicarbonate 16.8 (L) 20.0 - 28.0 mmol/L   TCO2 18 (L) 22 - 32 mmol/L   O2 Saturation 100.0 %   Acid-base deficit 10.0 (H) 0.0 - 2.0 mmol/L   Sodium 136 135 - 145 mmol/L   Potassium 4.1 3.5 - 5.1 mmol/L   Calcium, Ion 1.07 (L) 1.15 - 1.40 mmol/L   HCT 40.0 39.0 -  52.0 %   Hemoglobin 13.6 13.0 - 17.0 g/dL   Patient temperature 37.7 C    Sample type ARTERIAL   Lactic acid, plasma     Status: Abnormal   Collection Time: 07/23/17  8:58 PM  Result Value Ref Range   Lactic Acid, Venous 3.5 (HH) 0.5 - 1.9 mmol/L    Comment: CRITICAL RESULT CALLED TO, READ BACK BY AND VERIFIED WITH: SAWYERS,A RN 12.4.18 '@2146'$  ZANDO,C   Blood gas, arterial     Status: Abnormal   Collection Time: 07/23/17  9:00 PM  Result Value Ref Range   FIO2 60.00    Delivery systems VENTILATOR    Mode PRESSURE REGULATED VOLUME CONTROL    VT 420 mL   LHR 30 resp/min   Peep/cpap 5.0 cm H20   pH, Arterial 7.295 (L) 7.350 - 7.450   pCO2  arterial 39.7 32.0 - 48.0 mmHg   pO2, Arterial 90.2 83.0 - 108.0 mmHg   Bicarbonate 18.8 (L) 20.0 - 28.0 mmol/L   Acid-base deficit 6.9 (H) 0.0 - 2.0 mmol/L   O2 Saturation 95.4 %   Patient temperature 98.3    Collection site ARTERIAL LINE    Drawn by [phone]    Sample type ARTERIAL DRAW   MRSA PCR Screening     Status: None   Collection Time: 07/23/17 10:36 PM  Result Value Ref Range   MRSA by PCR NEGATIVE NEGATIVE    Comment:        The GeneXpert MRSA Assay (FDA approved for NASAL specimens only), is one component of a comprehensive MRSA colonization surveillance program. It is not intended to diagnose MRSA infection nor to guide or monitor treatment for MRSA infections.   Lactic acid, plasma     Status: Abnormal   Collection Time: 07/24/17  3:00 AM  Result Value Ref Range   Lactic Acid, Venous 4.1 (HH) 0.5 - 1.9 mmol/L    Comment: CRITICAL RESULT CALLED TO, READ BACK BY AND VERIFIED WITH: SAWYERS,A RN 12.5.18 '@0417'$  ZANDO,C   Protime-INR     Status: Abnormal   Collection Time: 07/24/17  3:50 AM  Result Value Ref Range   Prothrombin Time 20.5 (H) 11.4 - 15.2 seconds   INR 1.77   CBC with Differential/Platelet     Status: None   Collection Time: 07/24/17  3:50 AM  Result Value Ref Range   WBC 8.8 4.0 - 10.5 K/uL   RBC 4.57 4.22  - 5.81 MIL/uL   Hemoglobin 14.5 13.0 - 17.0 g/dL    Comment: DELTA CHECK NOTED REPEATED TO VERIFY    HCT 41.7 39.0 - 52.0 %   MCV 91.2 78.0 - 100.0 fL   MCH 31.7 26.0 - 34.0 pg   MCHC 34.8 30.0 - 36.0 g/dL   RDW 12.7 11.5 - 15.5 %   Platelets 226 150 - 400 K/uL   Neutrophils Relative % 85 %   Neutro Abs 7.4 1.7 - 7.7 K/uL   Lymphocytes Relative 8 %   Lymphs Abs 0.7 0.7 - 4.0 K/uL   Monocytes Relative 7 %   Monocytes Absolute 0.7 0.1 - 1.0 K/uL   Eosinophils Relative 0 %   Eosinophils Absolute 0.0 0.0 - 0.7 K/uL   Basophils Relative 0 %   Basophils Absolute 0.0 0.0 - 0.1 K/uL   WBC Morphology INCREASED BANDS (>20% BANDS)   Blood gas, arterial     Status: Abnormal   Collection Time: 07/24/17  5:00 AM  Result Value Ref Range   FIO2 60.00    Mode VENTILATOR    VT 580 mL   LHR 30 resp/min   Peep/cpap 5.0 cm H20   pH, Arterial 7.368 7.350 - 7.450   pCO2 arterial 26.8 (L) 32.0 - 48.0 mmHg   pO2, Arterial 188 (H) 83.0 - 108.0 mmHg   Bicarbonate 15.0 (L) 20.0 - 28.0 mmol/L   Acid-base deficit 8.4 (H) 0.0 - 2.0 mmol/L   O2 Saturation 99.2 %   Patient temperature 98.7    Collection site ARTERIAL LINE    Drawn by [id number]    Sample type ARTERIAL DRAW   Cortisol     Status: None   Collection Time: 07/24/17  5:39 AM  Result Value Ref Range   Cortisol, Plasma 32.7 ug/dL    Comment: (NOTE) AM    6.7 - 22.6 ug/dL PM   <10.0  ug/dL Performed at [facility], [place] [address] [place], [place] [address]   Basic metabolic panel     Status: Abnormal   Collection Time: 07/24/17  5:39 AM  Result Value Ref Range   Sodium 133 (L) 135 - 145 mmol/L   Potassium 5.1 3.5 - 5.1 mmol/L   Chloride 108 101 - 111 mmol/L   CO2 14 (L) 22 - 32 mmol/L   Glucose, Bld 106 (H) 65 - 99 mg/dL   BUN 48 (H) 6 - 20 mg/dL   Creatinine, Ser 4.32 (H) 0.61 - 1.24 mg/dL   Calcium 6.8 (L) 8.9 - 10.3 mg/dL   GFR calc non Af Amer 13 (L) >60 mL/min   GFR calc Af Amer 15 (L) >60 mL/min    Comment:  (NOTE) The eGFR has been calculated using the CKD EPI equation. This calculation has not been validated in all clinical situations. eGFR's persistently <60 mL/min signify possible Chronic Kidney Disease.    Anion gap 11 5 - 15  Magnesium     Status: Abnormal   Collection Time: 07/24/17  5:39 AM  Result Value Ref Range   Magnesium 1.2 (L) 1.7 - 2.4 mg/dL  Procalcitonin     Status: None   Collection Time: 07/24/17  5:39 AM  Result Value Ref Range   Procalcitonin >150.00 ng/mL    Comment:        Interpretation: PCT >= 10 ng/mL: Important systemic inflammatory response, almost exclusively due to severe bacterial sepsis or septic shock. (NOTE)       Sepsis PCT Algorithm           Lower Respiratory Tract                                      Infection PCT Algorithm    ----------------------------     ----------------------------         PCT < 0.25 ng/mL                PCT < 0.10 ng/mL         Strongly encourage             Strongly discourage   discontinuation of antibiotics    initiation of antibiotics    ----------------------------     -----------------------------       PCT 0.25 - 0.50 ng/mL            PCT 0.10 - 0.25 ng/mL               OR       >80% decrease in PCT            Discourage initiation of                                            antibiotics      Encourage discontinuation           of antibiotics    ----------------------------     -----------------------------         PCT >= 0.50 ng/mL              PCT 0.26 - 0.50 ng/mL                AND       <80% decrease  in PCT             Encourage initiation of                                             antibiotics       Encourage continuation           of antibiotics    ----------------------------     -----------------------------        PCT >= 0.50 ng/mL                  PCT > 0.50 ng/mL               AND         increase in PCT                  Strongly encourage                                      initiation of  antibiotics    Strongly encourage escalation           of antibiotics                                     -----------------------------                                           PCT <= 0.25 ng/mL                                                 OR                                        > 80% decrease in PCT                                     Discontinue / Do not initiate                                             antibiotics   Phosphorus     Status: None   Collection Time: 07/24/17  5:39 AM  Result Value Ref Range   Phosphorus 3.3 2.5 - 4.6 mg/dL  Triglycerides     Status: Abnormal   Collection Time: 07/24/17  5:39 AM  Result Value Ref Range   Triglycerides 233 (H) <150 mg/dL    Comment: Performed at [facility], [address], [place], [place] [address]  Lactic acid, plasma     Status: Abnormal   Collection Time: 07/24/17 12:32 PM  Result Value Ref Range   Lactic Acid, Venous 3.3 (HH) 0.5 - 1.9 mmol/L  Comment: CRITICAL RESULT CALLED TO, READ BACK BY AND VERIFIED WITH: CHAVEZ A RN AT 1540 07/24/2017 [id number]   Hepatic function panel     Status: Abnormal   Collection Time: 07/24/17 12:35 PM  Result Value Ref Range   Total Protein 4.2 (L) 6.5 - 8.1 g/dL   Albumin 1.6 (L) 3.5 - 5.0 g/dL   AST 56 (H) 15 - 41 U/L   ALT 24 17 - 63 U/L   Alkaline Phosphatase 32 (L) 38 - 126 U/L   Total Bilirubin 0.8 0.3 - 1.2 mg/dL   Bilirubin, Direct 0.3 0.1 - 0.5 mg/dL   Indirect Bilirubin 0.5 0.3 - 0.9 mg/dL  Prealbumin     Status: Abnormal   Collection Time: 07/24/17 12:35 PM  Result Value Ref Range   Prealbumin 9.3 (L) 18 - 38 mg/dL    Comment: Performed at [facility], [address], [place], [place] [address]  Renal function panel     Status: Abnormal   Collection Time: 07/24/17 12:35 PM  Result Value Ref Range   Sodium 133 (L) 135 - 145 mmol/L   Potassium 4.9 3.5 - 5.1 mmol/L   Chloride 105 101 - 111 mmol/L   CO2 18 (L) 22 - 32 mmol/L   Glucose, Bld 109 (H) 65 - 99  mg/dL   BUN 52 (H) 6 - 20 mg/dL   Creatinine, Ser 4.54 (H) 0.61 - 1.24 mg/dL   Calcium 6.2 (LL) 8.9 - 10.3 mg/dL    Comment: CRITICAL RESULT CALLED TO, READ BACK BY AND VERIFIED WITH: CHAVEZ, A RN AT 1403 07/24/2017 OKOYEHJ    Phosphorus 4.2 2.5 - 4.6 mg/dL   Albumin 1.7 (L) 3.5 - 5.0 g/dL   GFR calc non Af Amer 13 (L) >60 mL/min   GFR calc Af Amer 15 (L) >60 mL/min    Comment: (NOTE) The eGFR has been calculated using the CKD EPI equation. This calculation has not been validated in all clinical situations. eGFR's persistently <60 mL/min signify possible Chronic Kidney Disease.    Anion gap 10 5 - 15   Ct Abdomen Pelvis Wo Contrast  Result Date: 07/23/2017 CLINICAL DATA:  Severe abdominal pain and distention. EXAM: CT ABDOMEN AND PELVIS WITHOUT CONTRAST TECHNIQUE: Multidetector CT imaging of the abdomen and pelvis was performed following the standard protocol without IV contrast. COMPARISON:  None. FINDINGS: Lower chest: Normal heart size without pericardial effusion. Small bilateral pleural effusions with adjacent atelectasis. Hepatobiliary: Branching air like lucencies in the left hepatic lobe, series 2 image 29 and coronal series 5 image 37 concerning for portal venous gas. No space-occupying mass of the liver. The gallbladder is physiologically distended. Pancreas: Normal pancreas without ductal dilatation or mass on this unenhanced study. Spleen: Normal size spleen. Adrenals/Urinary Tract: Normal bilateral adrenal glands. 2.8 cm right interpolar posterior complex cyst with thin septation. Hyperdense left lower pole 0.8 cm lesion likely hemorrhagic or proteinaceous cyst. Decompressed urinary bladder. Stomach/Bowel: Fluid-filled distention of the stomach. The contours of the stomach appear intact without apparent ulceration nor perforation. Fluid-filled distention of the second through fourth portions of the duodenum without perforation nor retroperitoneal emphysema. Moderate fluid-filled  distention of jejunal loops with pneumatosis. Serpiginous extraluminal air emanating off a segment of small intestine in the right hemiabdomen, series 2, image 51 might represent a potential site for small bowel perforation. The colon is unremarkable apart from scattered colonic diverticulosis. Vascular/Lymphatic: Mild aortoiliac atherosclerosis without aneurysm. No lymphadenopathy. Reproductive: Prostate is normal in size. Other: Moderate to  marked amount of free intraperitoneal air admixed with free fluid, subdiaphragmatic, perihepatic and perisplenic splenic in location extending along both paracolic gutters into the pelvis. Small bowel containing right inguinal hernia with fluid containing left inguinal hernia. Musculoskeletal: Thoracolumbar spondylosis. IMPRESSION: 1. Large amount of free intraperitoneal air and fluid consistent with perforated hollow viscus likely involving small intestine with possible source in the right hemiabdomen, series 2, image 51. Critical Value/emergent results were called by telephone at the time of interpretation on 07/23/2017 at 4:59 pm to [place] , who verbally acknowledged these results. 2. Abnormal bowel gas pattern concerning for small bowel pneumatosis with small amount of portal venous gas in the left hepatic lobe. This raises concern for ischemic bowel. 3. Bilateral inguinal hernias on the right containing a short segment of small intestine and on the left containing fluid. These are reportedly reducible clinically. 4. Complex bilateral renal cysts. Electronically Signed   By: Ashley Royalty M.D.   On: 07/23/2017 17:03   Dg Chest Port 1 View  Result Date: 07/24/2017 CLINICAL DATA:  Central line placement EXAM: PORTABLE CHEST 1 VIEW COMPARISON:  Portable exam 1616 hours compared to 0420 hours FINDINGS: Tip of endotracheal tube projects 5.0 cm above carina. Nasogastric tube extends into stomach. RIGHT jugular central venous catheter with tip projecting over  proximal SVC. LEFT subclavian central venous catheter with tip projecting over mid SVC. Stable heart size mediastinal contours. Bibasilar opacities greater on LEFT question atelectasis versus consolidation. Upper lungs clear. Tiny LEFT pleural effusion. No pneumothorax. IMPRESSION: Persistent bibasilar opacities LEFT greater than RIGHT question atelectasis versus infiltrate. No pneumothorax following RIGHT jugular line placement. Electronically Signed   By: Lavonia Dana M.D.   On: 07/24/2017 16:36   Dg Chest Port 1 View  Result Date: 07/24/2017 CLINICAL DATA:  Respiratory failure EXAM: PORTABLE CHEST 1 VIEW COMPARISON:  11/25/2008 FINDINGS: Endotracheal tube, left subclavian central line and nasogastric catheter are again seen and stable. Cardiac shadow is stable. Mild bibasilar atelectasis is noted. No sizable effusion or pneumothorax is seen. IMPRESSION: Tubes and lines as described above. Mild bibasilar atelectatic changes. Electronically Signed   By: Inez Catalina M.D.   On: 07/24/2017 07:02   Dg Chest Port 1 View  Result Date: 07/23/2017 CLINICAL DATA:  Endotracheal tube placement. EXAM: PORTABLE CHEST 1 VIEW COMPARISON:  Chest x-ray from same day at 16:38. FINDINGS: Interval placement of an endotracheal tube with the tip approximately 3.6 cm above the level of the carina. Interval placement of an enteric tube entering the stomach with the tip and distal side port beyond the field of view. Unchanged left subclavian central venous catheter with the tip at the cavoatrial junction. The left costophrenic angle is not included in the field of view. The cardiomediastinal silhouette is normal in size. Normal pulmonary vascularity. Low lung volumes are present, causing crowding of the pulmonary vasculature. No focal consolidation, pleural effusion, or pneumothorax. No acute osseous abnormality. IMPRESSION: 1. Appropriately positioned endotracheal tube. Enteric tube entering the stomach with the tip beyond the  field of view. 2.  No active cardiopulmonary disease. Electronically Signed   By: Titus Dubin M.D.   On: 07/23/2017 20:53   Dg Chest Portable 1 View  Result Date: 07/23/2017 CLINICAL DATA:  Status post central line placement EXAM: PORTABLE CHEST 1 VIEW COMPARISON:  None. FINDINGS: There is a left subclavian central venous catheter with the tip projecting over the SVC. There is no focal parenchymal opacity. There is no pleural effusion or pneumothorax. The  heart and mediastinal contours are unremarkable. The osseous structures are unremarkable. Pneumoperitoneum is not well delineated on the current x-ray. IMPRESSION: 1. Left subclavian central venous catheter with the tip projecting over the SVC. 2. Pneumoperitoneum is not well delineated on the current x-ray. Please refer to CT of abdomen for further characterization. Electronically Signed   By: Kathreen Devoid   On: 07/23/2017 16:56   Dg Abd Acute W/chest  Result Date: 07/23/2017 CLINICAL DATA:  Severe abdominal pain and distention, tachycardia, shortness of breath EXAM: DG ABDOMEN ACUTE W/ 1V CHEST COMPARISON:  None FINDINGS: Normal heart size, mediastinal contours and pulmonary vascularity. Mild bibasilar atelectasis. Lungs otherwise clear. No infiltrate, pleural effusion or pneumothorax. Free intraperitoneal air consistent with perforated viscus. Nonobstructive bowel gas pattern. No bowel dilatation or bowel wall thickening. Degenerative changes of the spine. No urinary tract calcifications. IMPRESSION: Free intraperitoneal air consistent with perforated viscus. Mild bibasilar atelectasis. Critical Value/emergent results were called by telephone at the time of interpretation on 07/23/2017 at 2:55 pm to [facility] PA, who verbally acknowledged these results. Electronically Signed   By: Lavonia Dana M.D.   On: 07/23/2017 14:56   Dg Abd Portable 1v  Result Date: 07/23/2017 CLINICAL DATA:  Incorrect sponge count. EXAM: PORTABLE ABDOMEN - 1 VIEW  COMPARISON:  Radiographs of same day. FINDINGS: Distal tip of nasogastric tube is seen in expected position of distal stomach. Mildly dilated small bowel loops are noted which may represent ileus. No radiopaque foreign body is noted. IMPRESSION: Nasogastric tube tip seen in expected position of distal stomach. No radiopaque foreign body is noted. Mildly dilated small bowel loops are noted which may represent ileus. Electronically Signed   By: Marijo Conception, M.D.   On: 07/23/2017 20:17    ROS: unobtainable due to intubation  Blood pressure (!) 84/54, pulse (!) 116, temperature (!) 102.5 F (39.2 C), temperature source Oral, resp. rate (!) 22, height '5\' 10"'$  (1.778 m), weight 79.4 kg (175 lb), SpO2 97 %. Physical Exam  GEN ill appearing, intubated and sedated HEENT eyes closed, NGT with bilious output, subglottic suction with rusty output NECK R IJ nontunneled HD cath in place PULM coarse bilaterally CV tachy ABD minimal BS, +open abdomen, vac in place EXT trace edema NEURO intubated and sedated SKIN no rashes   Assessment/Plan 1.  Acute oliguric AKI: severe AKI.  Oliguria.  Will need CRRT most likely if this round of fluids doesn't improve urine output.  Have written orders in case we need them overnight.   2.  Perforated viscus: to OR tomorrow with CCS  3.  Shock: septic.  + BC with E coli.  On broad-spectrum antibiotics  4.  Acute hypoxic RF: per PCCM.   Madelon Lips, MD [facility] pgr [phone] 07/24/2017, 4:44 PM

## 2017-07-24 NOTE — Progress Notes (Signed)
eLink Physician-Brief Progress Note Patient Name: Lamar LaundryFrederick Croson DOB: [birth year] MRN: [id number]   Date of Service  07/24/2017  HPI/Events of Note  Contacted by nursing staff regarding hypotension. Patient on Levophed at 1235. Still on propofol. Last Versed bolus at approximately 9 PM yesterday. Patient seems to be exquisitely sensitive to propofol infusion. No documented history of chronic steroid use.   eICU Interventions  1. Continuing Versed drip for pain relief 2. Changing to quadruple strength Levophed 3. Checking stat cortisol level 4. Normal saline 500 mL bolus 1 5. Transitioning off propofol infusion over to intermittent bolus Versed for sedation 6. Checking complete echocardiogram      Intervention Category Major Interventions: Shock - evaluation and management;Sepsis - evaluation and management  Lawanda CousinsJennings Miyu Fenderson 07/24/2017, 4:09 AM

## 2017-07-24 NOTE — Progress Notes (Signed)
eLink Physician-Brief Progress Note Patient Name: Caleb LaundryFrederick Singh DOB: 1956/03/24 MRN: [id number]   Date of Service  07/24/2017  HPI/Events of Note  Patient still with hypotension despite IV fluid bolus. Nurse reports minimal to no response from IV fluid bolus. Currently on Levophed at 40. As per previous conversation with Gen. surgery patient had a questionable segment of bowel perfusion.   eICU Interventions  1. Continuing Levophed infusion 2. Ordering Neo-Synephrine 3. Awaiting cortisol      Intervention Category Major Interventions: Shock - evaluation and management  Lawanda CousinsJennings Ilyse Tremain 07/24/2017, 5:03 AM

## 2017-07-24 NOTE — Progress Notes (Signed)
2D Echocardiogram has been performed.  Caleb Singh Caleb Singh 07/24/2017, 3:00 PM

## 2017-07-24 NOTE — Op Note (Signed)
NAME:  GRACIN, [place] NO.:  [id number]  MEDICAL RECORD NO.:  [id number]  LOCATION:                                 FACILITY:  PHYSICIAN:  Mary Sella. Andrey Campanile, MD, FACSDATE OF BIRTH:  03/21/1956  DATE OF PROCEDURE:  07/23/2017 DATE OF DISCHARGE:                              OPERATIVE REPORT   PREOPERATIVE DIAGNOSIS:  Perforated viscus sepsis.  POSTOPERATIVE DIAGNOSES: 1. Perforated viscus sepsis. 2. Distal small bowel perforation, questionable cecal ischemia.  PROCEDURES: 1. Exploratory laparotomy. 2. Small bowel resection without anastomosis. 3. Placement of open abdominal wound VAC system.  SURGEON:  Mary Sella. Andrey Campanile, MD, FACS  ASSISTANT SURGEON:  Angelia Mould. Derrell Lolling, M.D. FACS  ANESTHESIA:  General.  ESTIMATED BLOOD LOSS:  100 mL.  SPECIMEN:  Distal small bowel stitch marks perforation along with questionable Meckel's diverticulum downstream.  FINDINGS:  1500 mL of small bowel enteric contents throughout the abdomen.  Extensive small bowel wall reactive thickening.  There was a small bowel perforation of about 30 to 40 cm from the cecum.  The defect was about 0.5 cm along with a focal area of necrosis in the mesentery next to the perforation, it was very isolated.  However, downstream from this, about 15 cm, there appeared to be a Meckel's diverticulum versus a small bowel diverticula, which was resected en bloc.  The cecum appeared dusky; therefore, I felt the patient needed a second look.  The transected staple ends of the small bowel were tacked together with 2-0 Prolene.  INDICATIONS FOR PROCEDURE:  The patient is a pleasant 61 year old African American male who developed acute abdominal pain on Sunday.  The pain worsened to the point where he could not tolerate and came into the emergency room for evaluation.  He was found to be in acute renal failure and found to have free air on plain film of his abdomen.  A noncontrast CT scan was concerning  for a small bowel perforation. Critical Care Medicine saw the patient and placed a central line.  I recommended immediate exploratory laparotomy.  We talked at length about the risks and benefits of surgery including, but not limited to bleeding, infection, injury to surrounding structures, need for additional procedures, potential need for anastomosis with potential risk of stricture, leak, incisional hernia, worsening renal failure, death, aspiration pneumonia, blood clots, prolonged hospitalization, and intraabdominal abscess.  The patient elected to proceed with surgery.  DESCRIPTION OF PROCEDURE:  The patient received broad-spectrum IV antibiotics in the ER.  He was taken urgently to the OR #1 at [facility].  He was placed supine on the operating room table. General endotracheal anesthesia was established.  An arterial line was placed.  A Foley catheter was placed.  His abdomen was prepped and draped in the usual standard surgical fashion with ChloraPrep.  A surgical time-out was performed.  A small upper midline incision was initially made to try to identify the location, to minimize the length of the incision.  The skin was incised with a 10 blade.  Subcutaneous tissue was divided with electrocautery.  The abdominal cavity was entered.  There was frank expression of enteric contents.  I then extended the incision.  It  became quite obvious that this was the small bowel source.  The incision was extended further both above and below in either direction.  A Balfour retractor was placed.  He had extensive reactive hyperemia of his small bowel.  The small bowel was eviscerated, and I started to run the bowel.  There was an obvious perforation of the distal small bowel, approximately 30 to 40 cm from the cecum.  The perforation was about 0.5 cm in size.  I placed two interrupted 2-0 silk sutures to limit ongoing contamination.  There was a focal area of necrosis in the  corresponding mesentery, which appeared to be isolated. I ran the bowel distally and it appeared to be tethered to the pelvis. There was also, what appeared to be, an epiploic appendage tethered to the small bowel as well, that was going to the sigmoid colon.  At this point, a Bookwalter retractor was placed.  I took down the epiploic appendage that was fused to the distal small bowel with Kelly's and 2 silk ties.  At this point, Dr. Derrell LollingIngram joined me in the operating room. I continued to mobilize the small bowel up out of the pelvis, taking down adhesions with a pair of Metzenbaum to the pelvic sidewall.  This freed the small bowel, and I was able to get to the cecum.  The cecum appeared dusky, but not frankly necrotic.  The omentum was tethered over the hepatic flexure and transverse colon, but otherwise looked normal. There was no evidence of ischemia or significant wall thickening to the transverse colon, left colon, or sigmoid colon.  The stomach appeared viable with no evidence of ischemia or perforation.  At this point, I decided to resect the distal small bowel just above where the perforation was.  In running the small bowel distally down toward the cecum, it appeared to be a diverticulum coming off about 15 cm from the small bowel from the area of perforation.  I was not sure if it was a Meckel's diverticulum or just a classic small bowel diverticulum, but I felt it needed to be resected en bloc with the area of perforation.  A small rent in the mesentery was made just next to the small bowel wall. Then, the small bowel was divided with a GIA 75 stapler with a blue load.  We then went about 3 cm below the area of Meckel's diverticulum in the distal small bowel.  Again, a small window was made in the mesentery just next to the bowel wall.  It was transected with a GIA 75 stapler with a blue load.  We then took down the mesentery in sequential fashion using the LigaSure device.  The  bowel was passed off the field. At this point, we started irrigating the abdomen copiously with 6 L of saline.  The patient was on phenylephrine at this point, and I felt the patient would benefit from a second look to further evaluate the cecum to determine its viability.  The two ends of the small bowel were then loosely tacked together with a 2-0 Prolene.  We then returned the bowel to the abdomen.  An open abdominal ABThera wound VAC system was applied in the conventional way, and we had appropriate seal with no leaks. There was one instrument count that was off the Kocher's; therefore, an abdominal x-ray was obtained which showed no retained foreign body.  The patient was taken intubated to the ICU for resuscitation and further evaluation.     Minerva AreolaEric  Elson ClanM. Hermes Wafer, MD, FACS     EMW/MEDQ  D:  07/23/2017  T:  07/24/2017  Job:  [id number]

## 2017-07-25 ENCOUNTER — Inpatient Hospital Stay (HOSPITAL_COMMUNITY): Payer: Non-veteran care | Admitting: Certified Registered Nurse Anesthetist

## 2017-07-25 ENCOUNTER — Inpatient Hospital Stay (HOSPITAL_COMMUNITY): Payer: Non-veteran care

## 2017-07-25 ENCOUNTER — Other Ambulatory Visit: Payer: Self-pay

## 2017-07-25 ENCOUNTER — Encounter (HOSPITAL_COMMUNITY): Payer: Self-pay | Admitting: Certified Registered Nurse Anesthetist

## 2017-07-25 ENCOUNTER — Encounter (HOSPITAL_COMMUNITY)
Admission: EM | Disposition: A | Discharge status: Skilled Nursing Facility | Payer: Self-pay | Source: Home / Self Care | Attending: Internal Medicine

## 2017-07-25 HISTORY — PX: LAPAROTOMY: SHX154

## 2017-07-25 LAB — RENAL FUNCTION PANEL
ALBUMIN: 1.7 g/dL — AB (ref 3.5–5.0)
Albumin: 1.6 g/dL — ABNORMAL LOW (ref 3.5–5.0)
Anion gap: 16 — ABNORMAL HIGH (ref 5–15)
Anion gap: 13 (ref 5–15)
BUN: 39 mg/dL — AB (ref 6–20)
BUN: 38 mg/dL — ABNORMAL HIGH (ref 6–20)
CALCIUM: 6.6 mg/dL — AB (ref 8.9–10.3)
CO2: 25 mmol/L (ref 22–32)
CO2: 22 mmol/L (ref 22–32)
CREATININE: 2.59 mg/dL — AB (ref 0.61–1.24)
CREATININE: 2.64 mg/dL — AB (ref 0.61–1.24)
Calcium: 5.9 mg/dL — CL (ref 8.9–10.3)
Chloride: 98 mmol/L — ABNORMAL LOW (ref 101–111)
Chloride: 99 mmol/L — ABNORMAL LOW (ref 101–111)
GFR calc Af Amer: 29 mL/min — ABNORMAL LOW (ref >60)
GFR calc non Af Amer: 24 mL/min — ABNORMAL LOW (ref >60)
GFR, EST AFRICAN AMERICAN: 28 mL/min — AB (ref >60)
GFR, EST NON AFRICAN AMERICAN: 25 mL/min — AB (ref >60)
GLUCOSE: 136 mg/dL — AB (ref 65–99)
Glucose, Bld: 122 mg/dL — ABNORMAL HIGH (ref 65–99)
PHOSPHORUS: 4.2 mg/dL (ref 2.5–4.6)
POTASSIUM: 4 mmol/L (ref 3.5–5.1)
Phosphorus: 5 mg/dL — ABNORMAL HIGH (ref 2.5–4.6)
Potassium: 4.4 mmol/L (ref 3.5–5.1)
SODIUM: 134 mmol/L — AB (ref 135–145)
Sodium: 139 mmol/L (ref 135–145)

## 2017-07-25 LAB — COMPREHENSIVE METABOLIC PANEL
ALK PHOS: 43 U/L (ref 38–126)
ALT: 29 U/L (ref 17–63)
ANION GAP: 11 (ref 5–15)
AST: 75 U/L — ABNORMAL HIGH (ref 15–41)
Albumin: 1.8 g/dL — ABNORMAL LOW (ref 3.5–5.0)
BUN: 39 mg/dL — ABNORMAL HIGH (ref 6–20)
CALCIUM: 6.6 mg/dL — AB (ref 8.9–10.3)
CO2: 24 mmol/L (ref 22–32)
CREATININE: 3.02 mg/dL — AB (ref 0.61–1.24)
Chloride: 99 mmol/L — ABNORMAL LOW (ref 101–111)
GFR, EST AFRICAN AMERICAN: 24 mL/min — AB (ref >60)
GFR, EST NON AFRICAN AMERICAN: 21 mL/min — AB (ref >60)
Glucose, Bld: 111 mg/dL — ABNORMAL HIGH (ref 65–99)
Potassium: 4.3 mmol/L (ref 3.5–5.1)
SODIUM: 134 mmol/L — AB (ref 135–145)
TOTAL PROTEIN: 4.7 g/dL — AB (ref 6.5–8.1)
Total Bilirubin: 0.8 mg/dL (ref 0.3–1.2)

## 2017-07-25 LAB — CBC
HCT: 37.9 % — ABNORMAL LOW (ref 39.0–52.0)
HEMOGLOBIN: 13.1 g/dL (ref 13.0–17.0)
MCH: 31.2 pg (ref 26.0–34.0)
MCHC: 34.6 g/dL (ref 30.0–36.0)
MCV: 90.2 fL (ref 78.0–100.0)
PLATELETS: 90 10*3/uL — AB (ref 150–400)
RBC: 4.2 MIL/uL — AB (ref 4.22–5.81)
RDW: 12.9 % (ref 11.5–15.5)
WBC: 11.9 10*3/uL — AB (ref 4.0–10.5)

## 2017-07-25 LAB — MAGNESIUM: MAGNESIUM: 1.8 mg/dL (ref 1.7–2.4)

## 2017-07-25 LAB — PROCALCITONIN: Procalcitonin: >150 ng/mL

## 2017-07-25 LAB — TRIGLYCERIDES: Triglycerides: 400 mg/dL — ABNORMAL HIGH (ref <150)

## 2017-07-25 LAB — PHOSPHORUS: Phosphorus: 4.4 mg/dL (ref 2.5–4.6)

## 2017-07-25 SURGERY — LAPAROTOMY, EXPLORATORY
Anesthesia: General | Site: Abdomen

## 2017-07-25 MED ORDER — FENTANYL CITRATE (PF) 100 MCG/2ML IJ SOLN
INTRAMUSCULAR | Status: AC
  Filled 2017-07-25: qty 2

## 2017-07-25 MED ORDER — ONDANSETRON HCL 4 MG/2ML IJ SOLN
INTRAMUSCULAR | Status: AC
  Filled 2017-07-25: qty 2

## 2017-07-25 MED ORDER — PROPOFOL 1000 MG/100ML IV EMUL
0.0000 ug/kg/min | INTRAVENOUS | Status: AC
  Administered 2017-07-25: 10 ug/kg/min via INTRAVENOUS
  Administered 2017-07-25: 8 ug/kg/min via INTRAVENOUS
  Filled 2017-07-25 (×3): qty 100

## 2017-07-25 MED ORDER — FENTANYL CITRATE (PF) 100 MCG/2ML IJ SOLN
INTRAMUSCULAR | Status: DC | PRN
  Administered 2017-07-25 (×3): 100 ug via INTRAVENOUS

## 2017-07-25 MED ORDER — ROCURONIUM BROMIDE 50 MG/5ML IV SOSY
PREFILLED_SYRINGE | INTRAVENOUS | Status: AC
  Filled 2017-07-25: qty 5

## 2017-07-25 MED ORDER — ROCURONIUM BROMIDE 10 MG/ML (PF) SYRINGE
PREFILLED_SYRINGE | INTRAVENOUS | Status: DC | PRN
  Administered 2017-07-25 (×2): 50 mg via INTRAVENOUS

## 2017-07-25 MED ORDER — DEXAMETHASONE SODIUM PHOSPHATE 10 MG/ML IJ SOLN
INTRAMUSCULAR | Status: AC
  Filled 2017-07-25: qty 1

## 2017-07-25 MED ORDER — HEPARIN SODIUM (PORCINE) 5000 UNIT/ML IJ SOLN
5000.0000 [IU] | Freq: Three times a day (TID) | INTRAMUSCULAR | Status: DC
  Administered 2017-07-26 – 2017-07-27 (×2): 5000 [IU] via SUBCUTANEOUS
  Filled 2017-07-25 (×2): qty 1

## 2017-07-25 MED ORDER — SODIUM CHLORIDE 0.9 % IV SOLN
1.0000 g | Freq: Once | INTRAVENOUS | Status: AC
  Administered 2017-07-25: 1 g via INTRAVENOUS
  Filled 2017-07-25: qty 10

## 2017-07-25 MED ORDER — LACTATED RINGERS IV SOLN
INTRAVENOUS | Status: DC | PRN
  Administered 2017-07-25: 12:00:00 via INTRAVENOUS

## 2017-07-25 MED ORDER — CHLORHEXIDINE GLUCONATE CLOTH 2 % EX PADS: 6.0000 | MEDICATED_PAD | Freq: Once | CUTANEOUS | Status: DC

## 2017-07-25 MED ORDER — PANTOPRAZOLE SODIUM 40 MG IV SOLR
40.0000 mg | Freq: Two times a day (BID) | INTRAVENOUS | Status: DC
  Administered 2017-07-25 – 2017-08-03 (×19): 40 mg via INTRAVENOUS
  Filled 2017-07-25 (×19): qty 40

## 2017-07-25 MED ORDER — VASOPRESSIN 20 UNIT/ML IV SOLN
INTRAVENOUS | Status: DC | PRN
  Administered 2017-07-25: .03 [IU]/min via INTRAVENOUS

## 2017-07-25 MED ORDER — ALBUMIN HUMAN 5 % IV SOLN
INTRAVENOUS | Status: AC
  Filled 2017-07-25: qty 250

## 2017-07-25 MED ORDER — CHLORHEXIDINE GLUCONATE 0.12% ORAL RINSE (MEDLINE KIT)
15.0000 mL | Freq: Two times a day (BID) | OROMUCOSAL | Status: DC
  Administered 2017-07-25 – 2017-08-17 (×45): 15 mL via OROMUCOSAL

## 2017-07-25 MED ORDER — DEXAMETHASONE SODIUM PHOSPHATE 4 MG/ML IJ SOLN
INTRAMUSCULAR | Status: DC | PRN
  Administered 2017-07-25: 5 mg via INTRAVENOUS

## 2017-07-25 MED ORDER — NOREPINEPHRINE BITARTRATE 1 MG/ML IV SOLN
INTRAVENOUS | Status: DC | PRN
  Administered 2017-07-25: 20 ug/min via INTRAVENOUS

## 2017-07-25 MED ORDER — ALBUMIN HUMAN 5 % IV SOLN
INTRAVENOUS | Status: DC | PRN
  Administered 2017-07-25: 13:00:00 via INTRAVENOUS

## 2017-07-25 MED ORDER — ORAL CARE MOUTH RINSE
15.0000 mL | Freq: Four times a day (QID) | OROMUCOSAL | Status: DC
  Administered 2017-07-25 – 2017-08-13 (×66): 15 mL via OROMUCOSAL

## 2017-07-25 MED ORDER — SODIUM CHLORIDE 0.9 % IV SOLN
INTRAVENOUS | Status: DC | PRN
  Administered 2017-07-25: 200 ug/h via INTRAVENOUS

## 2017-07-25 MED ORDER — ONDANSETRON HCL 4 MG/2ML IJ SOLN
INTRAMUSCULAR | Status: DC | PRN
  Administered 2017-07-25: 4 mg via INTRAVENOUS

## 2017-07-25 MED ORDER — CHLORHEXIDINE GLUCONATE CLOTH 2 % EX PADS
6.0000 | MEDICATED_PAD | Freq: Every day | CUTANEOUS | Status: DC
  Administered 2017-07-25 – 2017-08-03 (×9): 6 via TOPICAL

## 2017-07-25 MED ORDER — MIDAZOLAM HCL 2 MG/2ML IJ SOLN
INTRAMUSCULAR | Status: AC
  Filled 2017-07-25: qty 2

## 2017-07-25 SURGICAL SUPPLY — 84 items
APPLIER CLIP 5 13 M/L LIGAMAX5 (MISCELLANEOUS)
APPLIER CLIP ROT 10 11.4 M/L (STAPLE)
APR CLP MED LRG 11.4X10 (STAPLE)
APR CLP MED LRG 5 ANG JAW (MISCELLANEOUS)
BLADE EXTENDED COATED 6.5IN (ELECTRODE) ×3 IMPLANT
BLADE HEX COATED 2.75 (ELECTRODE) ×6 IMPLANT
BNDG GAUZE ELAST 4 BULKY (GAUZE/BANDAGES/DRESSINGS) ×2 IMPLANT
CABLE HIGH FREQUENCY MONO STRZ (ELECTRODE) ×1 IMPLANT
CATH KIT ON-Q SILVERSOAK 7.5 (CATHETERS) IMPLANT
CATH KIT ON-Q SILVERSOAK 7.5IN (CATHETERS) IMPLANT
CELLS DAT CNTRL 66122 CELL SVR (MISCELLANEOUS) IMPLANT
CLIP APPLIE 5 13 M/L LIGAMAX5 (MISCELLANEOUS) IMPLANT
CLIP APPLIE ROT 10 11.4 M/L (STAPLE) IMPLANT
COUNTER NEEDLE 20 DBL MAG RED (NEEDLE) ×1 IMPLANT
DECANTER SPIKE VIAL GLASS SM (MISCELLANEOUS) ×3 IMPLANT
DRAIN CHANNEL 19F RND (DRAIN) IMPLANT
DRAPE LAPAROSCOPIC ABDOMINAL (DRAPES) ×3 IMPLANT
DRAPE UTILITY XL STRL (DRAPES) ×6 IMPLANT
DRSG OPSITE POSTOP 4X10 (GAUZE/BANDAGES/DRESSINGS) IMPLANT
DRSG OPSITE POSTOP 4X6 (GAUZE/BANDAGES/DRESSINGS) IMPLANT
DRSG OPSITE POSTOP 4X8 (GAUZE/BANDAGES/DRESSINGS) IMPLANT
DRSG TEGADERM 4X4.75 (GAUZE/BANDAGES/DRESSINGS) IMPLANT
ELECT REM PT RETURN 15FT ADLT (MISCELLANEOUS) ×3 IMPLANT
ENDOLOOP SUT PDS II  0 18 (SUTURE)
ENDOLOOP SUT PDS II 0 18 (SUTURE) IMPLANT
GAUZE SPONGE 4X4 12PLY STRL (GAUZE/BANDAGES/DRESSINGS) ×3 IMPLANT
GLOVE BIO SURGEON STRL SZ 6 (GLOVE) ×6 IMPLANT
GLOVE INDICATOR 6.5 STRL GRN (GLOVE) ×6 IMPLANT
GOWN STRL REUS W/ TWL XL LVL3 (GOWN DISPOSABLE) ×3 IMPLANT
GOWN STRL REUS W/TWL XL LVL3 (GOWN DISPOSABLE) ×9
HANDLE SUCTION POOLE (INSTRUMENTS) ×1 IMPLANT
LEGGING LITHOTOMY PAIR STRL (DRAPES) ×1 IMPLANT
LIGASURE IMPACT 36 18CM CVD LR (INSTRUMENTS) ×2 IMPLANT
LUBRICANT JELLY K Y 4OZ (MISCELLANEOUS) IMPLANT
PACK COLON (CUSTOM PROCEDURE TRAY) ×3 IMPLANT
PAD ABD 8X10 STRL (GAUZE/BANDAGES/DRESSINGS) ×2 IMPLANT
PAD POSITIONING PINK XL (MISCELLANEOUS) IMPLANT
POSITIONER SURGICAL ARM (MISCELLANEOUS) IMPLANT
RELOAD PROXIMATE 75MM BLUE (ENDOMECHANICALS) ×3 IMPLANT
RELOAD STAPLE 75 3.8 BLU REG (ENDOMECHANICALS) IMPLANT
RETRACTOR WND ALEXIS 18 MED (MISCELLANEOUS) IMPLANT
RTRCTR WOUND ALEXIS 18CM MED (MISCELLANEOUS)
SCISSORS LAP 5X35 DISP (ENDOMECHANICALS) ×3 IMPLANT
SEALER TISSUE G2 STRG ARTC 35C (ENDOMECHANICALS) IMPLANT
SET IRRIG TUBING LAPAROSCOPIC (IRRIGATION / IRRIGATOR) ×1 IMPLANT
SHEARS HARMONIC ACE PLUS 36CM (ENDOMECHANICALS) IMPLANT
SLEEVE SURGEON STRL (DRAPES) IMPLANT
SLEEVE XCEL OPT CAN 5 100 (ENDOMECHANICALS) ×2 IMPLANT
SOLUTION ANTI FOG 6CC (MISCELLANEOUS) ×1 IMPLANT
SPONGE LAP 18X18 X RAY DECT (DISPOSABLE) ×10 IMPLANT
STAPLER PROXIMATE 75MM BLUE (STAPLE) ×2 IMPLANT
STAPLER VISISTAT 35W (STAPLE) ×3 IMPLANT
SUCTION POOLE HANDLE (INSTRUMENTS) ×3
SUT MNCRL AB 4-0 PS2 18 (SUTURE) ×1 IMPLANT
SUT NOVA 1 T20/GS 25DT (SUTURE) ×4 IMPLANT
SUT PDS AB 1 CTX 36 (SUTURE) IMPLANT
SUT PDS AB 1 TP1 96 (SUTURE) ×4 IMPLANT
SUT PROLENE 2 0 KS (SUTURE) IMPLANT
SUT SILK 2 0 (SUTURE)
SUT SILK 2 0 SH CR/8 (SUTURE) IMPLANT
SUT SILK 2-0 18XBRD TIE 12 (SUTURE) IMPLANT
SUT SILK 3 0 (SUTURE)
SUT SILK 3 0 SH CR/8 (SUTURE) IMPLANT
SUT SILK 3-0 18XBRD TIE 12 (SUTURE) IMPLANT
SUT VIC AB 2-0 SH 18 (SUTURE) ×5 IMPLANT
SUT VIC AB 3-0 SH 18 (SUTURE) ×1 IMPLANT
SUT VICRYL 2 0 18  UND BR (SUTURE)
SUT VICRYL 2 0 18 UND BR (SUTURE) ×1 IMPLANT
SUT VICRYL 3 0 BR 18  UND (SUTURE)
SUT VICRYL 3 0 BR 18 UND (SUTURE) ×1 IMPLANT
SYS LAPSCP GELPORT 120MM (MISCELLANEOUS)
SYSTEM LAPSCP GELPORT 120MM (MISCELLANEOUS) IMPLANT
TAPE CLOTH 4X10 WHT NS (GAUZE/BANDAGES/DRESSINGS) IMPLANT
TAPE CLOTH SURG 6X10 WHT LF (GAUZE/BANDAGES/DRESSINGS) ×2 IMPLANT
TOWEL OR 17X26 10 PK STRL BLUE (TOWEL DISPOSABLE) ×6 IMPLANT
TOWEL OR NON WOVEN STRL DISP B (DISPOSABLE) ×6 IMPLANT
TRAY FOLEY W/METER SILVER 16FR (SET/KITS/TRAYS/PACK) ×1 IMPLANT
TROCAR BLADELESS OPT 5 100 (ENDOMECHANICALS) ×1 IMPLANT
TROCAR XCEL BLUNT TIP 100MML (ENDOMECHANICALS) IMPLANT
TROCAR XCEL NON-BLD 11X100MML (ENDOMECHANICALS) IMPLANT
TUBING CONNECTING 10 (TUBING) IMPLANT
TUBING CONNECTING 10' (TUBING)
TUBING INSUF HEATED (TUBING) ×1 IMPLANT
TUNNELER SHEATH ON-Q 16GX12 DP (PAIN MANAGEMENT) IMPLANT

## 2017-07-25 NOTE — Progress Notes (Signed)
CRITICAL VALUE ALERT  Critical Value:  Calcium: 5.9  Date & Time Notied:  07/25/2017, 1732  Provider Notified: Ramaswamy  Orders Received/Actions taken: administer 1 g Calcium Gluconate IV

## 2017-07-25 NOTE — Progress Notes (Signed)
Spoke with Dr. Marchelle Gearingamaswamy regarding elevated triglycerides with propofol administration.  Goal is for propofol to weaned off by tomorrow. Decision was made to proceed with current orders but add stop time of tomorrow at 08:00 and reassess sedation needs at that time.

## 2017-07-25 NOTE — Progress Notes (Signed)
2 Days Post-Op    PN:[id number] pain  Subjective: Caleb Singh weaned down, but now on vasopressin.  CRRT also started.     Objective: Vital signs in last 24 hours: Temp:  [96.9 F (36.1 C)-102.5 F (39.2 C)] 99.3 F (37.4 C) (12/06 0740) Pulse Rate:  [88-116] 90 (12/06 0900) Resp:  [16-23] 22 (12/06 1000) BP: (84-139)/(36-96) 126/74 (12/06 0800) SpO2:  [96 %-100 %] 100 % (12/06 0946) Arterial Line BP: (87-143)/(51-89) 105/69 (12/06 1000) FiO2 (%):  [40 %] 40 % (12/06 0946) Weight:  [94.5 kg (208 lb 5.4 oz)] 94.5 kg (208 lb 5.4 oz) (12/06 0415)  Intake/Output from previous day: 12/05 0701 - 12/06 0700 In: 5120.6 [I.V.:3665.6; IV Piggyback:1360] Out: 0086 [Urine:696; Emesis/NG output:360; Drains:360] Intake/Output this shift: Total I/O In: 560.8 [I.V.:500.8; Other:10; IV Piggyback:50] Out: 568 [Urine:103; Other:465]  General appearance: Sedated on the vent.   Resp: clear to auscultation bilaterally and antgerior exam Cardio: Sinus tachycardia GI: Wound VAC in place no bowel sounds. Abd soft.  Some swelling of abdominal wall.   Extremities: No edema  Lab Results:  Recent Labs    07/24/17 0350 07/25/17 0355  WBC 8.8 11.9*  HGB 14.5 13.1  HCT 41.7 37.9*  PLT 226 90*    BMET Recent Labs    07/24/17 2036 07/25/17 0355  NA 131* 134*  K 4.3 4.3  CL 101 99*  CO2 19* 24  GLUCOSE 148* 111*  BUN 45* 39*  CREATININE 3.56* 3.02*  CALCIUM 5.7* 6.6*   PT/INR Recent Labs    07/23/17 1455 07/24/17 0350  LABPROT 20.5* 20.5*  INR 1.78 1.77    Recent Labs  Lab 07/23/17 1455 07/24/17 1235 07/24/17 2036 07/25/17 0355  AST 47* 56*  --  75*  ALT 13* 24  --  29  ALKPHOS 78 32*  --  43  BILITOT 1.8* 0.8  --  0.8  PROT 7.2 4.2*  --  4.7*  ALBUMIN 3.3* 1.6*  1.7* 1.7* 1.8*     Lipase     Component Value Date/Time   LIPASE 47 07/23/2017 1455     Medications: . chlorhexidine gluconate (MEDLINE KIT)  15 mL Mouth Rinse BID  . Chlorhexidine Gluconate Cloth  6  each Topical Daily  . folic acid  1 mg Intravenous Daily  . heparin  5,000 Units Subcutaneous Q8H  . mouth rinse  15 mL Mouth Rinse QID  . pantoprazole (PROTONIX) IV  40 mg Intravenous Q24H  . sodium chloride flush  10-40 mL Intracatheter Q12H  . thiamine injection  100 mg Intravenous Daily   . anidulafungin Stopped (07/24/17 2247)  . fentaNYL infusion INTRAVENOUS 200 mcg/hr (07/25/17 1000)  . norepinephrine (Caleb Singh) Adult infusion 14 mcg/min (07/25/17 1046)  . piperacillin-tazobactam (ZOSYN)  IV Stopped (07/25/17 0930)  . dialysis replacement fluid (prismasate) 500 mL/hr at 07/25/17 0505  . dialysis replacement fluid (prismasate) 200 mL/hr at 07/24/17 1838  . dialysate (PRISMASATE) 1,500 mL/hr at 07/25/17 0507  . propofol (DIPRIVAN) infusion 10.053 mcg/kg/min (07/25/17 1000)  .  sodium bicarbonate  infusion 1000 mL 75 mL/hr at 07/25/17 1000  . sodium chloride    . vasopressin (PITRESSIN) infusion - *FOR SHOCK* 0.03 Units/min (07/25/17 1000)   Anti-infectives (From admission, onward)   Start     Dose/Rate Route Frequency Ordered Stop   07/25/17 2000  vancomycin (VANCOCIN) IVPB 1000 mg/200 mL premix  Status:  Discontinued     1,000 mg 200 mL/hr over 60 Minutes Intravenous Every 48 hours 07/23/17 2042 07/24/17  3343   07/24/17 2200  piperacillin-tazobactam (ZOSYN) 3.375 g in dextrose 5 % 50 mL IVPB     3.375 g 100 mL/hr over 30 Minutes Intravenous Every 6 hours 07/24/17 2006     07/24/17 2100  anidulafungin (ERAXIS) 100 mg in sodium chloride 0.9 % 100 mL IVPB     100 mg 78 mL/hr over 100 Minutes Intravenous Every 24 hours 07/23/17 2039     07/24/17 0000  anidulafungin (ERAXIS) 100 mg in sodium chloride 0.9 % 100 mL IVPB  Status:  Discontinued     100 mg 78 mL/hr over 100 Minutes Intravenous Every 24 hours 07/23/17 2038 07/23/17 2039   07/23/17 2200  piperacillin-tazobactam (ZOSYN) IVPB 3.375 g  Status:  Discontinued     3.375 g 12.5 mL/hr over 240 Minutes Intravenous Every 8 hours  07/23/17 2043 07/24/17 2006   07/23/17 2100  anidulafungin (ERAXIS) 200 mg in sodium chloride 0.9 % 200 mL IVPB     200 mg 78 mL/hr over 200 Minutes Intravenous  Once 07/23/17 2038 07/24/17 0239   07/23/17 2100  vancomycin (VANCOCIN) 1,500 mg in sodium chloride 0.9 % 500 mL IVPB     1,500 mg 250 mL/hr over 120 Minutes Intravenous  Once 07/23/17 2041 07/23/17 2317   07/23/17 1515  piperacillin-tazobactam (ZOSYN) IVPB 3.375 g     3.375 g 100 mL/hr over 30 Minutes Intravenous  Once 07/23/17 1511 07/23/17 1611     Assessment/Plan Perforated distal small bowel with questionable cecal ischemia A/P oratory laparotomy, small bowel resection without anastomosis, placement of open wound VAC system 07/23/17 Dr. Randall Hiss Singh/Dr. Fanny Singh  Sepsis/shock -Caleb Singh and vasopressin Gram Negative Bacteremia - Zosyn Acute respiratory failure - Ventilator per CCM Acute renal failure - CRRT Hyponatremia/K+ is elevated - IV fluids LR - defer to CCM on fluids History of heavy alcohol use - sedated on ventilatior History of tobacco use  FEN:  IV fluids ID: broad spectrum antibiotics.   DVT: SCDs Foley: In place - Do not remove order in place Follow-up: Dr. Greer Singh  Back to OR today.  Will see if any additional ischemic bowel present.  If not, will consider reconnection, although he is on pressors.  Hopefully he will be able to have his abdomen at least partially closed, but he may still be too edematous.   Discussed with his sister yesterday.    Caleb Singh 07/25/2017

## 2017-07-25 NOTE — Transfer of Care (Signed)
Immediate Anesthesia Transfer of Care Note  Patient: Caleb Singh  Procedure(s) Performed: EXPLORATORY LAPAROSCOPY WITH ILEOCECTOMY, END ILEOSTOMY (N/A Abdomen)  Patient Location: PACU and ICU  Anesthesia Type:General  Level of Consciousness: Patient remains intubated per anesthesia plan  Airway & Oxygen Therapy: Patient placed on Ventilator (see vital sign flow sheet for setting)  Post-op Assessment: Report given to RN and Post -op Vital signs reviewed and stable  Post vital signs: Reviewed and stable  Last Vitals:  Vitals:   07/25/17 0946 07/25/17 1000  BP:    Pulse:    Resp:  (!) 22  Temp:    SpO2: 100%     Last Pain:  Vitals:   07/25/17 0740  TempSrc: Axillary  PainSc:          Complications: No apparent anesthesia complications

## 2017-07-25 NOTE — Progress Notes (Signed)
[facility] Progress Note    Assessment/ Plan:   1.  Acute oliguric AKI: severe AKI.  Oliguric initially, making a little more urine now.  CRRT started last night, plan to restart after OR and follow hemodynamics and urine output.  All 4K baths, no heparin  2.  Perforated viscus: to OR today with CCS  3.  Shock: septic.  + BC with E coli.  On broad-spectrum antibiotics.  Per PCCM  4.  Acute hypoxic RF: per PCCM.  5.  Metabolic acidosis: this is being corrected with CRRT, will stop bicarb gtt as this is uncessary intake now.   Subjective:    CRRT started last night.  Going to OR today for second look.  Still intubated and sedated.  Pressors coming down.  Making a little more urine.   Objective:   BP 126/74 (BP Location: Right Arm)   Pulse 90   Temp 99.3 F (37.4 C) (Axillary)   Resp (!) 22   Ht 5' 10" (1.778 m)   Wt 94.5 kg (208 lb 5.4 oz)   SpO2 100%   BMI 29.89 kg/m   Intake/Output Summary (Last 24 hours) at 07/25/2017 1135 Last data filed at 07/25/2017 1000 Gross per 24 hour  Intake 5490.44 ml  Output 4011 ml  Net 1479.44 ml   Weight change: 15.1 kg (33 lb 5.4 oz)  Physical Exam: GEN ill appearing, intubated and sedated HEENT eyes closed, NGT with bilious output, subglottic suction with rusty output NECK R IJ nontunneled HD cath in place PULM coarse bilaterally CV tachy ABD minimal BS, +open abdomen, vac in place EXT 1+ upper and lower extremity edema NEURO intubated and sedated SKIN no rashes   Imaging: Ct Abdomen Pelvis Wo Contrast  Result Date: 07/23/2017 CLINICAL DATA:  Severe abdominal pain and distention. EXAM: CT ABDOMEN AND PELVIS WITHOUT CONTRAST TECHNIQUE: Multidetector CT imaging of the abdomen and pelvis was performed following the standard protocol without IV contrast. COMPARISON:  None. FINDINGS: Lower chest: Normal heart size without pericardial effusion. Small bilateral pleural effusions with adjacent atelectasis.  Hepatobiliary: Branching air like lucencies in the left hepatic lobe, series 2 image 29 and coronal series 5 image 37 concerning for portal venous gas. No space-occupying mass of the liver. The gallbladder is physiologically distended. Pancreas: Normal pancreas without ductal dilatation or mass on this unenhanced study. Spleen: Normal size spleen. Adrenals/Urinary Tract: Normal bilateral adrenal glands. 2.8 cm right interpolar posterior complex cyst with thin septation. Hyperdense left lower pole 0.8 cm lesion likely hemorrhagic or proteinaceous cyst. Decompressed urinary bladder. Stomach/Bowel: Fluid-filled distention of the stomach. The contours of the stomach appear intact without apparent ulceration nor perforation. Fluid-filled distention of the second through fourth portions of the duodenum without perforation nor retroperitoneal emphysema. Moderate fluid-filled distention of jejunal loops with pneumatosis. Serpiginous extraluminal air emanating off a segment of small intestine in the right hemiabdomen, series 2, image 51 might represent a potential site for small bowel perforation. The colon is unremarkable apart from scattered colonic diverticulosis. Vascular/Lymphatic: Mild aortoiliac atherosclerosis without aneurysm. No lymphadenopathy. Reproductive: Prostate is normal in size. Other: Moderate to marked amount of free intraperitoneal air admixed with free fluid, subdiaphragmatic, perihepatic and perisplenic splenic in location extending along both paracolic gutters into the pelvis. Small bowel containing right inguinal hernia with fluid containing left inguinal hernia. Musculoskeletal: Thoracolumbar spondylosis. IMPRESSION: 1. Large amount of free intraperitoneal air and fluid consistent with perforated hollow viscus likely involving small intestine with possible source in the right hemiabdomen,  series 2, image 51. Critical Value/emergent results were called by telephone at the time of interpretation on  07/23/2017 at 4:59 pm to [place] , who verbally acknowledged these results. 2. Abnormal bowel gas pattern concerning for small bowel pneumatosis with small amount of portal venous gas in the left hepatic lobe. This raises concern for ischemic bowel. 3. Bilateral inguinal hernias on the right containing a short segment of small intestine and on the left containing fluid. These are reportedly reducible clinically. 4. Complex bilateral renal cysts. Electronically Signed   By: Ashley Royalty M.D.   On: 07/23/2017 17:03   Dg Chest Port 1 View  Result Date: 07/25/2017 CLINICAL DATA:  Respiratory failure. EXAM: PORTABLE CHEST 1 VIEW COMPARISON:  07/24/2017. FINDINGS: Endotracheal tube, NG tube, right IJ line, left PICC line in stable position. Cardiomegaly with bilateral pulmonary infiltrates/edema and bilateral pleural effusions. Findings consistent congestive heart failure. No pneumothorax . IMPRESSION: 1.  Lines and tubes in stable position. 2. Cardiomegaly with bilateral pulmonary infiltrates and bilateral pleural effusions consistent with congestive heart failure. Electronically Signed   By: Marcello Moores  Register   On: 07/25/2017 08:03   Dg Chest Port 1 View  Result Date: 07/24/2017 CLINICAL DATA:  Central line placement EXAM: PORTABLE CHEST 1 VIEW COMPARISON:  Portable exam 1616 hours compared to 0420 hours FINDINGS: Tip of endotracheal tube projects 5.0 cm above carina. Nasogastric tube extends into stomach. RIGHT jugular central venous catheter with tip projecting over proximal SVC. LEFT subclavian central venous catheter with tip projecting over mid SVC. Stable heart size mediastinal contours. Bibasilar opacities greater on LEFT question atelectasis versus consolidation. Upper lungs clear. Tiny LEFT pleural effusion. No pneumothorax. IMPRESSION: Persistent bibasilar opacities LEFT greater than RIGHT question atelectasis versus infiltrate. No pneumothorax following RIGHT jugular line placement.  Electronically Signed   By: Lavonia Dana M.D.   On: 07/24/2017 16:36   Dg Chest Port 1 View  Result Date: 07/24/2017 CLINICAL DATA:  Respiratory failure EXAM: PORTABLE CHEST 1 VIEW COMPARISON:  11/25/2008 FINDINGS: Endotracheal tube, left subclavian central line and nasogastric catheter are again seen and stable. Cardiac shadow is stable. Mild bibasilar atelectasis is noted. No sizable effusion or pneumothorax is seen. IMPRESSION: Tubes and lines as described above. Mild bibasilar atelectatic changes. Electronically Signed   By: Inez Catalina M.D.   On: 07/24/2017 07:02   Dg Chest Port 1 View  Result Date: 07/23/2017 CLINICAL DATA:  Endotracheal tube placement. EXAM: PORTABLE CHEST 1 VIEW COMPARISON:  Chest x-ray from same day at 16:38. FINDINGS: Interval placement of an endotracheal tube with the tip approximately 3.6 cm above the level of the carina. Interval placement of an enteric tube entering the stomach with the tip and distal side port beyond the field of view. Unchanged left subclavian central venous catheter with the tip at the cavoatrial junction. The left costophrenic angle is not included in the field of view. The cardiomediastinal silhouette is normal in size. Normal pulmonary vascularity. Low lung volumes are present, causing crowding of the pulmonary vasculature. No focal consolidation, pleural effusion, or pneumothorax. No acute osseous abnormality. IMPRESSION: 1. Appropriately positioned endotracheal tube. Enteric tube entering the stomach with the tip beyond the field of view. 2.  No active cardiopulmonary disease. Electronically Signed   By: Titus Dubin M.D.   On: 07/23/2017 20:53   Dg Chest Portable 1 View  Result Date: 07/23/2017 CLINICAL DATA:  Status post central line placement EXAM: PORTABLE CHEST 1 VIEW COMPARISON:  None. FINDINGS: There is a  left subclavian central venous catheter with the tip projecting over the SVC. There is no focal parenchymal opacity. There is no pleural  effusion or pneumothorax. The heart and mediastinal contours are unremarkable. The osseous structures are unremarkable. Pneumoperitoneum is not well delineated on the current x-ray. IMPRESSION: 1. Left subclavian central venous catheter with the tip projecting over the SVC. 2. Pneumoperitoneum is not well delineated on the current x-ray. Please refer to CT of abdomen for further characterization. Electronically Signed   By: Kathreen Devoid   On: 07/23/2017 16:56   Dg Abd Acute W/chest  Result Date: 07/23/2017 CLINICAL DATA:  Severe abdominal pain and distention, tachycardia, shortness of breath EXAM: DG ABDOMEN ACUTE W/ 1V CHEST COMPARISON:  None FINDINGS: Normal heart size, mediastinal contours and pulmonary vascularity. Mild bibasilar atelectasis. Lungs otherwise clear. No infiltrate, pleural effusion or pneumothorax. Free intraperitoneal air consistent with perforated viscus. Nonobstructive bowel gas pattern. No bowel dilatation or bowel wall thickening. Degenerative changes of the spine. No urinary tract calcifications. IMPRESSION: Free intraperitoneal air consistent with perforated viscus. Mild bibasilar atelectasis. Critical Value/emergent results were called by telephone at the time of interpretation on 07/23/2017 at 2:55 pm to [facility], who verbally acknowledged these results. Electronically Signed   By: Lavonia Dana M.D.   On: 07/23/2017 14:56   Dg Abd Portable 1v  Result Date: 07/23/2017 CLINICAL DATA:  Incorrect sponge count. EXAM: PORTABLE ABDOMEN - 1 VIEW COMPARISON:  Radiographs of same day. FINDINGS: Distal tip of nasogastric tube is seen in expected position of distal stomach. Mildly dilated small bowel loops are noted which may represent ileus. No radiopaque foreign body is noted. IMPRESSION: Nasogastric tube tip seen in expected position of distal stomach. No radiopaque foreign body is noted. Mildly dilated small bowel loops are noted which may represent ileus. Electronically Signed    By: Marijo Conception, M.D.   On: 07/23/2017 20:17    Labs: BMET Recent Labs  Lab 07/23/17 1455 07/23/17 1504 07/23/17 1823 07/23/17 1914 07/24/17 0539 07/24/17 1235 07/24/17 2036 07/25/17 0355  NA 133* 132* 135 136 133* 133* 131* 134*  K 5.6* 5.4* 4.6 4.1 5.1 4.9 4.3 4.3  CL 96* 101  --   --  108 105 101 99*  CO2 17*  --   --   --  14* 18* 19* 24  GLUCOSE 112* 111*  --   --  106* 109* 148* 111*  BUN 42* 55*  --   --  48* 52* 45* 39*  CREATININE 4.05* 3.90*  --   --  4.32* 4.54* 3.56* 3.02*  CALCIUM 8.8*  --   --   --  6.8* 6.2* 5.7* 6.6*  PHOS  --   --   --   --  3.3 4.2 4.6 4.4   CBC Recent Labs  Lab 07/23/17 1455  07/23/17 1823 07/23/17 1914 07/24/17 0350 07/25/17 0355  WBC 9.1  --   --   --  8.8 11.9*  NEUTROABS 7.4  --   --   --  7.4  --   HGB 18.1*   < > 14.3 13.6 14.5 13.1  HCT 51.6   < > 42.0 40.0 41.7 37.9*  MCV 92.8  --   --   --  91.2 90.2  PLT 283  --   --   --  226 90*   < > = values in this interval not displayed.    Medications:    . chlorhexidine gluconate (MEDLINE KIT)  15 mL Mouth Rinse BID  . Chlorhexidine Gluconate Cloth  6 each Topical Daily  . folic acid  1 mg Intravenous Daily  . heparin  5,000 Units Subcutaneous Q8H  . mouth rinse  15 mL Mouth Rinse QID  . pantoprazole (PROTONIX) IV  40 mg Intravenous Q24H  . sodium chloride flush  10-40 mL Intracatheter Q12H  . thiamine injection  100 mg Intravenous Daily      Madelon Lips, MD [place] pgr [phone] 07/25/2017, 11:35 AM

## 2017-07-25 NOTE — Progress Notes (Signed)
PULMONARY / CRITICAL CARE MEDICINE   Name: [facility] MRN: [id number] DOB: 1956/01/06    ADMISSION DATE:  07/23/2017 CONSULTATION DATE: 07/23/2017   CHIEF COMPLAINT: Abdominal pain  HISTORY OF PRESENT ILLNESS:   This is an otherwise healthy 61 year old who began having severe abdominal pain on Sunday evening.  Onset was acute and the pain is remained localized to the midline anteriorly.  He has not had any pain radiating through to his back.  He has not had any bowel movement since Sunday morning.  He has had some nausea but no vomiting.  He has had no p.o. intake since that time.  Chest x-ray obtained in the emergency room shows free air.  He denies a history of prior abdominal surgeries.  Denies a history of weight loss or overt blood in the stool. Pertinent to his surgical risk, he has no known history of coronary artery disease, chest pain myocardial infarction, CHF, or arrhythmias.  He does have a very distant history of pulmonary embolism.  He gives no history consistent with chronic lung disease, he reports normal exercise tolerance and no difficulty with unusual dyspnea.  He has never had any significant exposure to anesthesia in the past his only surgical procedure was debridement of a finger on his right hand.  SUBJECTIVE:   Pressor requirements improved VITAL SIGNS: BP 126/74 (BP Location: Right Arm)   Pulse 89   Temp 99.3 F (37.4 C) (Axillary)   Resp (!) 22   Ht 5\' 10"  (1.778 m)   Wt 208 lb 5.4 oz (94.5 kg)   SpO2 100%   BMI 29.89 kg/m   HEMODYNAMICS: CVP:  [6 mmHg-22 mmHg] 12 mmHg  VENTILATOR SETTINGS: Vent Mode: PRVC FiO2 (%):  [40 %] 40 % Set Rate:  [22 bmp] 22 bmp Vt Set:  [580 mL] 580 mL PEEP:  [5 cmH20] 5 cmH20 Plateau Pressure:  [20 cmH20-21 cmH20] 20 cmH20  INTAKE / OUTPUT:  Intake/Output Summary (Last 24 hours) at 07/25/2017 0858 Last data filed at 07/25/2017 0800 Gross per 24 hour  Intake 5148.61 ml  Output 3664 ml  Net 1484.61 ml      PHYSICAL EXAMINATION: General: 61 year old African-American male currently sedated on the ventilator but easily arousable. HEENT: Normocephalic atraumatic orally intubated but does have audible cuff leak from endotracheal tube which was at 22 cm at lip.  Have asked respiratory therapy to advance. Pulmonary: Scattered rhonchi, diminished in the bases, no accessory muscle use, minimal oxygen requirements. Cardiac: Regular rate rhythm without murmur Abdomen: Wound VAC intact, NG tube to suction. Extremities/musculoskeletal: Warm, dry, brisk cap refill.  Strong pulses. Neuro/psych: Awakens easily, moves all extremities, agitated at times.   LABS:  BMET Recent Labs  Lab 07/24/17 1235 07/24/17 2036 07/25/17 0355  NA 133* 131* 134*  K 4.9 4.3 4.3  CL 105 101 99*  CO2 18* 19* 24  BUN 52* 45* 39*  CREATININE 4.54* 3.56* 3.02*  GLUCOSE 109* 148* 111*    Electrolytes Recent Labs  Lab 07/24/17 0539 07/24/17 1235 07/24/17 2036 07/25/17 0355  CALCIUM 6.8* 6.2* 5.7* 6.6*  MG 1.2*  --   --  1.8  PHOS 3.3 4.2 4.6 4.4    CBC Recent Labs  Lab 07/23/17 1455  07/23/17 1914 07/24/17 0350 07/25/17 0355  WBC 9.1  --   --  8.8 11.9*  HGB 18.1*   < > 13.6 14.5 13.1  HCT 51.6   < > 40.0 41.7 37.9*  PLT 283  --   --  226 90*   < > = values in this interval not displayed.    Coag's Recent Labs  Lab 07/23/17 1455 07/24/17 0350  INR 1.78 1.77    Sepsis Markers Recent Labs  Lab 07/23/17 1644  07/24/17 0300 07/24/17 0539 07/24/17 1232 07/24/17 1721 07/25/17 0355  LATICACIDVEN 3.3*   < > 4.1*  --  3.3* 3.5*  --   PROCALCITON 124.45  --   --  >150.00  --   --  >150.00   < > = values in this interval not displayed.    ABG Recent Labs  Lab 07/23/17 2100 07/24/17 0500 07/24/17 1900  PHART 7.295* 7.368 7.320*  PCO2ART 39.7 26.8* 34.4  PO2ART 90.2 188* 114*    Liver Enzymes Recent Labs  Lab 07/23/17 1455 07/24/17 1235 07/24/17 2036 07/25/17 0355  AST 47*  56*  --  75*  ALT 13* 24  --  29  ALKPHOS 78 32*  --  43  BILITOT 1.8* 0.8  --  0.8  ALBUMIN 3.3* 1.6*  1.7* 1.7* 1.8*    Cardiac Enzymes No results for input(s): TROPONINI, PROBNP in the last 168 hours.  Glucose No results for input(s): GLUCAP in the last 168 hours.  Imaging Dg Chest Port 1 View  Result Date: 07/25/2017 CLINICAL DATA:  Respiratory failure. EXAM: PORTABLE CHEST 1 VIEW COMPARISON:  07/24/2017. FINDINGS: Endotracheal tube, NG tube, right IJ line, left PICC line in stable position. Cardiomegaly with bilateral pulmonary infiltrates/edema and bilateral pleural effusions. Findings consistent congestive heart failure. No pneumothorax . IMPRESSION: 1.  Lines and tubes in stable position. 2. Cardiomegaly with bilateral pulmonary infiltrates and bilateral pleural effusions consistent with congestive heart failure. Electronically Signed   By: Maisie Fushomas  Register   On: 07/25/2017 08:03   Dg Chest Port 1 View  Result Date: 07/24/2017 CLINICAL DATA:  Central line placement EXAM: PORTABLE CHEST 1 VIEW COMPARISON:  Portable exam 1616 hours compared to 0420 hours FINDINGS: Tip of endotracheal tube projects 5.0 cm above carina. Nasogastric tube extends into stomach. RIGHT jugular central venous catheter with tip projecting over proximal SVC. LEFT subclavian central venous catheter with tip projecting over mid SVC. Stable heart size mediastinal contours. Bibasilar opacities greater on LEFT question atelectasis versus consolidation. Upper lungs clear. Tiny LEFT pleural effusion. No pneumothorax. IMPRESSION: Persistent bibasilar opacities LEFT greater than RIGHT question atelectasis versus infiltrate. No pneumothorax following RIGHT jugular line placement. Electronically Signed   By: Ulyses SouthwardMark  Boles M.D.   On: 07/24/2017 16:36      ASSESSMENT / PLAN:   Perforated viscus involving the distal small bowel s/p exploratory laparotomy with small bowel resection and placement of abdominal wound VAC; no  anastomosis made peritonitis and E. coli bacteremia -felt d/t diverticular disease  Plan Continue wound VAC per surgery Plan for OR again later today Continue n.p.o. Day #3 anidulafundin and zosyn; discontinue vancomycin   Septic shock -> pressor requirements improving  plan CVP goal 8-12 Map goal greater than 65 Fluid and pressors as indicated DC stress dose steroids Continue telemetry monitoring  Ventilator dependence status post abdominal surgery Mild iatrogenic respiratory alkalosis -PCXR: Endotracheal tube a little high, now with bibasilar airspace disease which appears to be element of both effusion and atelectasis Full vent support -ABG reviewed Plan Advance endotracheal tube  continue full ventilator support PAD protocol RASS goal -2--3 VAP prevention Follow-up a.m. chest x-ray and arterial blood gas.  Acute kidney injury with progressive NAG metabolic acidosis & hyperkalemia  -> Now on CRRT, appreciate  nephrology assistance Plan Strict intake and output Trend chemistry CRRT management per nephrology  Fluid and electrolyte abnormality Plan Close observations of electrolytes Replace as indicated  H/o ETOH abuse @ risk for w/d and acute delirium Plan Continue thiamine and folate Adding back propofol, continue fentanyl drip RASS goal -2--3  Mildly elevated INR and sepsis related thrombocytopenia Plan Serial LFTs Trend CBC  DVT prophylaxis: PAS SUP: PPI bid  Diet: NPO Activity: Bedrest Disposition :ICU  My critical care time 35 minutes Simonne Martinet ACNP-BC [facility] Pulmonary/Critical Care Pager # [phone] OR # [phone] if no answer   07/25/2017, 8:58 AM

## 2017-07-25 NOTE — Anesthesia Postprocedure Evaluation (Signed)
Anesthesia Post Note  Patient: Caleb Singh  Procedure(s) Performed: EXPLORATORY LAPAROSCOPY WITH ILEOCECTOMY, END ILEOSTOMY (N/A Abdomen)     Patient location during evaluation: SICU Anesthesia Type: General Level of consciousness: patient remains intubated per anesthesia plan and sedated Pain management: pain level controlled Vital Signs Assessment: post-procedure vital signs reviewed and stable Respiratory status: patient remains intubated per anesthesia plan Cardiovascular status: stable Postop Assessment: no apparent nausea or vomiting Anesthetic complications: no                Dickie Cloe DANIEL

## 2017-07-25 NOTE — Op Note (Signed)
PRE-OPERATIVE DIAGNOSIS: open abdomen, bowel in discontinuity, sepsis s/p bowel perforation  POST-OPERATIVE DIAGNOSIS:  Same  PROCEDURE:  Procedure(s): Reexploration of abdomen, ileocecectomy, creation of end ileostomy, closure of abdominal wall  SURGEON:  Surgeon(s): Almond LintFaera Zania Kalisz, MD  ASSIST:   Zola ButtonWill Jennings, PA-C  ANESTHESIA:   general  DRAINS: none   LOCAL MEDICATIONS USED:  NONE  SPECIMEN:  Source of Specimen:  terminal ileum and cecum  DISPOSITION OF SPECIMEN:  PATHOLOGY  COUNTS:  YES  DICTATION: .Dragon Dictation  PLAN OF CARE: back to ICU intubated, on pressors  PATIENT DISPOSITION:  ICU - intubated and critically ill.  FINDINGS: Ischemic cecum.  Some murky fluid in the abdomen but no frank purulent drainage.  No small bowel ischemia.  Peak respiratory pressures in the mid 20s before and after abdominal wall closure.    EBL: Less than 50 mL  PROCEDURE:  The patient was identified in the ICU and taken straight back to the operating room.  He was then placed supine on the operating room table and general anesthesia was induced.  The outer layer of the wound VAC was removed and the patient's abdomen was prepped and draped in standard fashion.  A timeout was performed according to the surgical safety checklist.  When all was correct, we continued.  The patient's abdomen was then explored.  The small bowel loops were very gently separated and eviscerated.  There was murky fluid between several of the loops.  The site of the prior perforation where the bowel was in discontinuity was identified.  There was no ischemia on the proximal portion of the small bowel.  The cecum had areas of patchy ischemia.  The operative surgeon from earlier in the week was able to evaluate the cecum and said it definitely had worsened since his original surgery.   The decision was made to resect the cecum.  There was a small portion of terminal ileum with the cecum.  The attachments to the  retroperitoneum were taken down.  The LigaSure was used to divide the mesentery.  One of the bleeding vessels in the mesentery had to be oversewn.  The abdomen was then copiously irrigated.  A location for a right-sided ileostomy was identified.  A small circular portion of skin was removed with the cautery.  The anterior rectus sheath was opened in cruciate fashion with cautery.  The rectus muscles were divided bluntly with the Kelly clamp.  Posterior fascia was then divided in cruciate fashion as well.  A Babcock clamp was used to gently pull the end of the ileum through the abdominal wall.  The fascia was then closed using running #1 looped PDS sutures with interrupted #1 Novafil pops.  The starting peak pulmonary pressures were 26 and end peak  pulmonary pressures were 28.  The ostomy was then matured in [place] fashion using interrupted 3-0 Vicryl pops.  The ostomy was dressed with an ostomy appliance in the midline was packed with Kerlix.  The patient was then placed back on his ICU bed and taken back to the ICU intubated and critical condition.  Needle, sponge, and instrument counts were correct x2.

## 2017-07-26 DIAGNOSIS — J9600 Acute respiratory failure, unspecified whether with hypoxia or hypercapnia: Secondary | ICD-10-CM

## 2017-07-26 LAB — CULTURE, BLOOD (ROUTINE X 2): SPECIAL REQUESTS: ADEQUATE

## 2017-07-26 LAB — RENAL FUNCTION PANEL
ALBUMIN: 1.6 g/dL — AB (ref 3.5–5.0)
Albumin: 1.5 g/dL — ABNORMAL LOW (ref 3.5–5.0)
Anion gap: 11 (ref 5–15)
Anion gap: 9 (ref 5–15)
BUN: 40 mg/dL — AB (ref 6–20)
BUN: 45 mg/dL — AB (ref 6–20)
CALCIUM: 6.7 mg/dL — AB (ref 8.9–10.3)
CHLORIDE: 103 mmol/L (ref 101–111)
CO2: 25 mmol/L (ref 22–32)
CO2: 24 mmol/L (ref 22–32)
CREATININE: 2.69 mg/dL — AB (ref 0.61–1.24)
Calcium: 6.3 mg/dL — CL (ref 8.9–10.3)
Chloride: 100 mmol/L — ABNORMAL LOW (ref 101–111)
Creatinine, Ser: 2.85 mg/dL — ABNORMAL HIGH (ref 0.61–1.24)
GFR calc Af Amer: 26 mL/min — ABNORMAL LOW (ref >60)
GFR calc Af Amer: 27 mL/min — ABNORMAL LOW (ref >60)
GFR, EST NON AFRICAN AMERICAN: 22 mL/min — AB (ref >60)
GFR, EST NON AFRICAN AMERICAN: 24 mL/min — AB (ref >60)
GLUCOSE: 115 mg/dL — AB (ref 65–99)
Glucose, Bld: 135 mg/dL — ABNORMAL HIGH (ref 65–99)
PHOSPHORUS: 4.7 mg/dL — AB (ref 2.5–4.6)
POTASSIUM: 4.6 mmol/L (ref 3.5–5.1)
Phosphorus: 4 mg/dL (ref 2.5–4.6)
Potassium: 4.2 mmol/L (ref 3.5–5.1)
Sodium: 136 mmol/L (ref 135–145)
Sodium: 136 mmol/L (ref 135–145)

## 2017-07-26 LAB — BLOOD GAS, ARTERIAL
ACID-BASE DEFICIT: 0.6 mmol/L (ref 0.0–2.0)
Acid-base deficit: 0.5 mmol/L (ref 0.0–2.0)
BICARBONATE: 23.5 mmol/L (ref 20.0–28.0)
Bicarbonate: 22.8 mmol/L (ref 20.0–28.0)
DRAWN BY: 295031
Drawn by: 295031
FIO2: 40
FIO2: 40
LHR: 22 {breaths}/min
MECHVT: 580 mL
O2 SAT: 96.4 %
O2 Saturation: 97.8 %
PATIENT TEMPERATURE: 98.6
PATIENT TEMPERATURE: 98.6
PCO2 ART: 34.1 mmHg (ref 32.0–48.0)
PCO2 ART: 38.9 mmHg (ref 32.0–48.0)
PEEP/CPAP: 5 cmH2O
PEEP: 5 cmH2O
PO2 ART: 104 mmHg (ref 83.0–108.0)
RATE: 22 resp/min
VT: 580 mL
pH, Arterial: 7.441 (ref 7.350–7.450)
pH, Arterial: 7.398 (ref 7.350–7.450)
pO2, Arterial: 94.2 mmHg (ref 83.0–108.0)

## 2017-07-26 LAB — GLUCOSE, CAPILLARY
GLUCOSE-CAPILLARY: 134 mg/dL — AB (ref 65–99)
Glucose-Capillary: 29 mg/dL — CL (ref 65–99)

## 2017-07-26 LAB — MAGNESIUM: MAGNESIUM: 2.3 mg/dL (ref 1.7–2.4)

## 2017-07-26 LAB — TRIGLYCERIDES: TRIGLYCERIDES: 281 mg/dL — AB (ref <150)

## 2017-07-26 MED ORDER — ALTEPLASE 2 MG IJ SOLR: 2.0000 mg | Freq: Once | INTRAMUSCULAR | Status: DC

## 2017-07-26 MED ORDER — SODIUM CHLORIDE 0.9 % IV SOLN
0.5000 mg/h | INTRAVENOUS | Status: DC
  Administered 2017-07-26: 3 mg/h via INTRAVENOUS
  Administered 2017-07-26: 0.5 mg/h via INTRAVENOUS
  Administered 2017-07-27: 1 mg/h via INTRAVENOUS
  Administered 2017-07-28: 3 mg/h via INTRAVENOUS
  Filled 2017-07-26 (×5): qty 10

## 2017-07-26 MED ORDER — INSULIN ASPART 100 UNIT/ML ~~LOC~~ SOLN
0.0000 [IU] | Freq: Four times a day (QID) | SUBCUTANEOUS | Status: DC
  Administered 2017-07-29 – 2017-07-30 (×6): 1 [IU] via SUBCUTANEOUS
  Administered 2017-07-31: 2 [IU] via SUBCUTANEOUS
  Administered 2017-07-31: 1 [IU] via SUBCUTANEOUS
  Administered 2017-07-31 – 2017-08-01 (×4): 2 [IU] via SUBCUTANEOUS

## 2017-07-26 MED ORDER — ALTEPLASE 2 MG IJ SOLR
2.0000 mg | Freq: Once | INTRAMUSCULAR | Status: AC
  Administered 2017-07-26: 2 mg
  Filled 2017-07-26 (×2): qty 2

## 2017-07-26 MED ORDER — ALTEPLASE 2 MG IJ SOLR
2.0000 mg | Freq: Once | INTRAMUSCULAR | Status: AC
  Administered 2017-07-26: 2 mg
  Filled 2017-07-26: qty 2

## 2017-07-26 MED ORDER — CHLORHEXIDINE GLUCONATE 0.12 % MT SOLN
OROMUCOSAL | Status: AC
  Administered 2017-07-26: 15 mL via OROMUCOSAL
  Filled 2017-07-26: qty 15

## 2017-07-26 MED ORDER — TRACE MINERALS CR-CU-MN-SE-ZN 10-1000-500-60 MCG/ML IV SOLN
INTRAVENOUS | Status: AC
  Administered 2017-07-26: 18:00:00 via INTRAVENOUS
  Filled 2017-07-26 (×2): qty 960

## 2017-07-26 MED ORDER — PROPOFOL 1000 MG/100ML IV EMUL: 0.0000 ug/kg/min | INTRAVENOUS | Status: DC

## 2017-07-26 MED ORDER — SODIUM CHLORIDE 0.9 % IV BOLUS (SEPSIS)
750.0000 mL | Freq: Once | INTRAVENOUS | Status: AC
  Administered 2017-07-26: 750 mL via INTRAVENOUS

## 2017-07-26 NOTE — Consult Note (Signed)
WOC Nurse ostomy consult note Stoma type/location: RLQ end ileostomy; a one piece opaque pouch is in place at this time and MD would like two piece transparent system at this time.  Pouch change performed.  Patient is agitated and is given Versed per order for agitation.  Stomal assessment/size: dusky from 9 to 2 o'clock.  Shape is oval  :  4 cm left to right x 2.5 cm head to toe  Medical adhesive related skin injury (MARSI) noted to distal perimeter of adhesive from 2 to 5 o'clock; 6o'clock at base of stoma, extends distally 3 cm; 7 to 10 o'clock.  This area is protected with stoma powder and no sting skin barrier   Barrier ring to peristomal skin and two piece transparent system placed per MD preference. Peristomal assessment: see above Treatment options for stomal/peristomal skin: barrier ring and stoma powder with skin barrier  Output minimal soft brown effluent in pouch Ostomy pouching: 2pc. 2 3/4" transparent system with barrier ring.  Stoma powder and [facility]   Education provided: Close family friend "Susie" is at bedside and asks appropriate questions about ostomy care.   Enrolled patient in [employer] program: No WOC team will follow.  Maple HudsonKaren Mccartney Brucks RN BSN CWON Pager [phone]

## 2017-07-26 NOTE — Progress Notes (Signed)
PULMONARY / CRITICAL CARE MEDICINE   Name: Caleb Singh MRN: [id number] DOB: [birth year]    ADMISSION DATE:  07/23/2017 CONSULTATION DATE: 07/23/2017   CHIEF COMPLAINT: Abdominal pain  HISTORY OF PRESENT ILLNESS:   This is an otherwise healthy 61 year old who began having severe abdominal pain on Sunday evening.  Onset was acute and the pain is remained localized to the midline anteriorly.  He has not had any pain radiating through to his back.  He has not had any bowel movement since Sunday morning.  He has had some nausea but no vomiting.  He has had no p.o. intake since that time.  Chest x-ray obtained in the emergency room shows free air.  He denies a history of prior abdominal surgeries.  Denies a history of weight loss or overt blood in the stool. Pertinent to his surgical risk, he has no known history of coronary artery disease, chest pain myocardial infarction, CHF, or arrhythmias.  He does have a very distant history of pulmonary embolism.  He gives no history consistent with chronic lung disease, he reports normal exercise tolerance and no difficulty with unusual dyspnea.  He has never had any significant exposure to anesthesia in the past his only surgical procedure was debridement of a finger on his right hand.  SUBJECTIVE:  Postop, increased pressor requirements overnight VITAL SIGNS: BP (!) 82/59 (BP Location: Other (Comment)) Comment (BP Location): ART line  Pulse (!) 45   Temp 98.3 F (36.8 C) (Axillary)   Resp 19   Ht 5\' 10"  (1.778 m)   Wt 207 lb 3.7 oz (94 kg)   SpO2 100%   BMI 29.73 kg/m   HEMODYNAMICS: CVP:  [7 mmHg-15 mmHg] 8 mmHg  VENTILATOR SETTINGS: Vent Mode: PRVC FiO2 (%):  [40 %] 40 % Set Rate:  [22 bmp] 22 bmp Vt Set:  [580 mL] 580 mL PEEP:  [5 cmH20] 5 cmH20 Plateau Pressure:  [21 cmH20-24 cmH20] 23 cmH20  INTAKE / OUTPUT:  Intake/Output Summary (Last 24 hours) at 07/26/2017 0801 Last data filed at 07/26/2017 0800 Gross per 24 hour  Intake  3533.67 ml  Output 2086 ml  Net 1447.67 ml     PHYSICAL EXAMINATION: General: 61 year old  African-American male, appears comfortable on ventilator HEENT: Normocephalic atraumatic orally intubated mucous membranes moist right IJ dialysis catheter intact Pulmonary: Clear to auscultation normal chest rise decreased bases Cardiac: Regular rate and rhythm without murmur rub or gallop Abdomen: Ileostomy unremarkable, abdomen soft, mid abdominal dressing intact, hypoactive bowel sounds. Extremities/musculoskeletal: Warm, dry, brisk capillary refill, no edema, strong pulses. Neuro/psych: Sedated on ventilator   LABS:  BMET Recent Labs  Lab 07/25/17 1549 07/25/17 2012 07/26/17 0430  NA 139 134* 136  K 4.0 4.4 4.6  CL 98* 99* 100*  CO2 25 22 25   BUN 39* 38* 40*  CREATININE 2.59* 2.64* 2.85*  GLUCOSE 122* 136* 135*    Electrolytes Recent Labs  Lab 07/24/17 0539  07/25/17 0355 07/25/17 1549 07/25/17 2012 07/26/17 0429 07/26/17 0430  CALCIUM 6.8*   < > 6.6* 5.9* 6.6*  --  6.7*  MG 1.2*  --  1.8  --   --  2.3  --   PHOS 3.3   < > 4.4 4.2 5.0*  --  4.7*   < > = values in this interval not displayed.    CBC Recent Labs  Lab 07/23/17 1455  07/23/17 1914 07/24/17 0350 07/25/17 0355  WBC 9.1  --   --  8.8 11.9*  HGB  18.1*   < > 13.6 14.5 13.1  HCT 51.6   < > 40.0 41.7 37.9*  PLT 283  --   --  226 90*   < > = values in this interval not displayed.    Coag's Recent Labs  Lab 07/23/17 1455 07/24/17 0350  INR 1.78 1.77    Sepsis Markers Recent Labs  Lab 07/23/17 1644  07/24/17 0300 07/24/17 0539 07/24/17 1232 07/24/17 1721 07/25/17 0355  LATICACIDVEN 3.3*   < > 4.1*  --  3.3* 3.5*  --   PROCALCITON 124.45  --   --  >150.00  --   --  >150.00   < > = values in this interval not displayed.    ABG Recent Labs  Lab 07/23/17 2100 07/24/17 0500 07/24/17 1900  PHART 7.295* 7.368 7.320*  PCO2ART 39.7 26.8* 34.4  PO2ART 90.2 188* 114*    Liver  Enzymes Recent Labs  Lab 07/23/17 1455 07/24/17 1235  07/25/17 0355 07/25/17 1549 07/25/17 2012 07/26/17 0430  AST 47* 56*  --  75*  --   --   --   ALT 13* 24  --  29  --   --   --   ALKPHOS 78 32*  --  43  --   --   --   BILITOT 1.8* 0.8  --  0.8  --   --   --   ALBUMIN 3.3* 1.6*  1.7*   < > 1.8* 1.7* 1.6* 1.6*   < > = values in this interval not displayed.    Cardiac Enzymes No results for input(s): TROPONINI, PROBNP in the last 168 hours.  Glucose No results for input(s): GLUCAP in the last 168 hours.  Imaging No results found.    ASSESSMENT / PLAN:   Perforated viscus involving the distal small bowel s/p exploratory laparotomy with small bowel resection and placement of abdominal wound VAC; no anastomosis made peritonitis w/ E. coli bacteremia -felt d/t diverticular disease; back to the OR 12/6 underwent reexploration and creation of an ileostomy and closure of abdominal wall Plan Continue n.p.o. status Routine wound care per surgery Day #4 anidulafludin and zosyn; awaiting final sensitivities  Septic shock -> pressor requirements have improved, is now increased again CVP suggests degree of volume depletion plan CVP goal is 8-12 Map goal greater than 65 Administrating 750 cc of saline; have requested nursing staff to keep even volume status after that with CRRT Continue telemetry monitoring  Ventilator dependence status post abdominal surgery Mild iatrogenic respiratory alkalosis -Chest x-ray 12/6 demonstrated bibasilar atelectasis/effusions Plan Repeat chest x-ray in a.m. ABG this a.m. Continue ventilator support for now; not ready for weaning PAD protocol; RASS -1 VAP prevention   Acute kidney injury with progressive NAG metabolic acidosis & hyperkalemia  -> Now on CRRT, appreciate nephrology assistance Plan Strict intake and output Serial chemistries CRRT per nephrology  Fluid and electrolyte abnormality Plan Close observation of electrolytes  replace as indicated  H/o ETOH abuse @ risk for w/d and acute delirium Plan Continue thiamine and folate Fentanyl and propofol infusion Change RASS goal to -1    Mildly elevated INR and sepsis related thrombocytopenia -No evidence of bleeding Plan Repeat CBC May need to transition from subcu heparin to PAS if platelets continue to drop  transfuse per protocol  DVT prophylaxis: Subcu heparin SUP: PPI bid  Diet: NPO Activity: Bedrest Disposition :ICU   Critical care time 35 minutes Simonne MartinetPeter E Marizol Borror ACNP-BC [facility] Pager # [phone]  OR # [phone] if no answer   07/26/2017, 8:01 AM

## 2017-07-26 NOTE — Progress Notes (Signed)
Pharmacy Antibiotic Note  Caleb Singh is a 61 y.o. male admitted on 07/23/2017 with sepsis, IAI, bowel perforation.  Pharmacy has been consulted for Zosyn dosing.  S/p OR on 12/4 and 12/7.  BCID results show Ecoli bacteremia with final cultures and sensitivities pending.    Tm 99.3 WBC 11.9 (solumedrol 12/5-12/6, decadron periop 12/6) SCr increased overnight to 2.85, remains on CRRT (keep even d/t hypotension) started on 12/5.  Plan:  Continue Zosyn 3.375g IV Q6H  Continue Eraxis 100 mg IV q24h per MD  Follow up renal fxn, culture results, and clinical course.  Follow up narrowing antibiotics when Ecoli sensitivities are available.  Height: 5\' 10"  (177.8 cm) Weight: 207 lb 3.7 oz (94 kg) IBW/kg (Calculated) : 73  Temp (24hrs), Avg:98.4 F (36.9 C), Min:98.2 F (36.8 C), Max:98.6 F (37 C)  Recent Labs  Lab 07/23/17 1455  07/23/17 1644 07/23/17 2058 07/24/17 0300 07/24/17 0350  07/24/17 1232  07/24/17 1721 07/24/17 2036 07/25/17 0355 07/25/17 1549 07/25/17 2012 07/26/17 0430  WBC 9.1  --   --   --   --  8.8  --   --   --   --   --  11.9*  --   --   --   CREATININE 4.05*   < >  --   --   --   --    < >  --    < >  --  3.56* 3.02* 2.59* 2.64* 2.85*  LATICACIDVEN 10.20*  --  3.3* 3.5* 4.1*  --   --  3.3*  --  3.5*  --   --   --   --   --    < > = values in this interval not displayed.    Estimated Creatinine Clearance: 30.5 mL/min (A) (by C-G formula based on SCr of 2.85 mg/dL (H)).    No Known Allergies  Antimicrobials this admission: 12/4 Vancomycin>>12/5 12/4 Zosyn>> 12/4 Eraxis >>  Dose adjustments this admission:   Microbiology results: 12/4BCx: 1of 3 bottles GNR (BCID = E.coli, no resistance) 12/4 MRSA PCR: negative  Thank you for allowing pharmacy to be a part of this patient's care.  Lynann Beaverhristine Chauncey Bruno PharmD, BCPS Pager [phone] 07/26/2017 8:55 AM

## 2017-07-26 NOTE — Anesthesia Postprocedure Evaluation (Signed)
Anesthesia Post Note  Patient: Caleb Singh  Procedure(s) Performed: EXPLORATORY LAPAROTOMY, SMALL BOWEL RESECTION (N/A )     Patient location during evaluation: PACU Anesthesia Type: General Level of consciousness: awake and alert Pain management: pain level controlled Vital Signs Assessment: post-procedure vital signs reviewed and stable Respiratory status: spontaneous breathing, nonlabored ventilation, respiratory function stable and patient connected to nasal cannula oxygen Cardiovascular status: blood pressure returned to baseline and stable Postop Assessment: no apparent nausea or vomiting Anesthetic complications: no    Last Vitals:  Vitals:   07/26/17 1400 07/26/17 1555  BP:    Pulse:    Resp: (!) 22   Temp:    SpO2:  100%    Last Pain:  Vitals:   07/26/17 1200  TempSrc: Oral  PainSc:    Pain Goal:                 Jiles GarterJACKSON,[name] EDWARD

## 2017-07-26 NOTE — Progress Notes (Signed)
Verbal order from Anders SimmondsPete Babcock, NP to administer 750 mL bolus of NS, and to keep patient even on CRRT until BP can tolerate being weaned off of pressors.  Bolus running now; will continue to monitor.

## 2017-07-26 NOTE — Progress Notes (Addendum)
Nutrition Follow-up  DOCUMENTATION CODES:   Not applicable  INTERVENTION:  - If pt to remains intubated >/= 24 more hours and pressors are able to be titrated down, recommend consideration of nutrition support. - RD will continue to monitor for plan.  NUTRITION DIAGNOSIS:   Inadequate oral intake related to inability to eat as evidenced by NPO status. -ongoing  GOAL:   Patient will meet greater than or equal to 90% of their needs -unmet/unable to meet.   MONITOR:   Vent status, Weight trends, Labs, Other (Comment)(Nutrition-related plan)  ASSESSMENT:   61 y.o. male with no significant past medical history complaining of severely worsening abdominal distention, pain, nonbloody, nonbilious, non-coffee-ground emesis and lack of bowel movements over the course of the last 3 days no BM in 48 hours, feels like he needs to deficate.  He denies prior abdominal surgeries, no fever or chills.  Patient is unsure if he is passing flatus.  He is never had any liver issues he does drink regularly but has never had any seizures or hallucinations when he does not drink, he has not had any alcohol in approximately 1 week.  He is never seen a gastroenterologist.   12/7 Pt went back to the OR yesterday and RUQ ileostomy now in place. He remains intubated with NGT to LIS and 100cc out since 4:00 AM today. No family/visitors present at this time. Estimated nutrition needs [facility] and needs for pt on CRRT, which was started 12/5 evening. Will continue to monitor weight trend closely.   Patient is currently intubated on ventilator support MV: 12.4 L/min Temp (24hrs), Avg:98.2 F (36.8 C), Min:97.1 F (36.2 C), Max:98.6 F (37 C) Propofol: 4.8 ml/hr (127 kcal) BP: 97/62 and MAP: 76  Medications reviewed; 1 g IV Ca gluconate x1 run yesterday, 1 mg IV folic acid/day, 40 mg IV Protonix BID, 100 mg IV thiamine/day.  Labs reviewed; Cl: 100 mmol/L, BUN: 40 mg/dL, creatinine: 0.982.85 mg/dL,  Ca: 6.7 mg/dL, Phos: 4.7 mg/dL, GFR: 26 mL/min.   Drips: Propofol @ 9 mcg/kg/min, Fentanyl @ 200 mcg/hr, Levo @ 20 mcg/min, Vaso @ 0.3 units/min.  ADDENDUM: Reviewed Pharmacy note entered after this RD's note this AM. Plan to initiate Clinimix 5/15 @ 40 mL/hr tonight; ILE on hold x7 days per ICU protocol. Goal for TPN at this time: Clinimix 5/15 @ 115 mL/hr which will provide 132 grams of protein (94% minimum estimated protein need), 1874 kcal (92% estimated kcal need).     12/5 - NGT to R nare with 1500cc dark green output in canister at this time.  - Pt is POD #1 ex lap, small bowel resection with anastomosis and placement of wound vac 2/2 perforated distal small bowel with questionable cecal ischemia.  - No family/visitors present to provide PTA information.  - Noted information from H&P, which is outlined above.  - Pt receiving 669 kcal from Propofol + D5 IVF.   Patient is currently intubated on ventilator support MV: 17.1 L/min Temp (24hrs), Avg:98.9 F (37.2 C), Min:98.4 F (36.9 C), Max:100 F (37.8 C) Propofol: 6.4 ml/hr (169 kcal) BP: 117/69 and MAP: 86  Medication; 2 g IV Mg sulfate x1 run today Lab; Mg: 1.2 mg/dL IVF: J1-9145-150 mEq KCl @ 782125 mL/hr (510 kcal) Drips: Fentanyl @ 125 mcg/hr, Levo @ 10 mcg/min, Propofol @ 12 mcg/min/kg.      Diet Order:  Diet NPO time specified  EDUCATION NEEDS:   No education needs have been identified at this time  Skin:  Skin Assessment: Skin Integrity Issues: Skin Integrity Issues:: Incisions Incisions: Abdominal on 12/4 with wound vac  Last BM:  PTA/unknown  Height:   Ht Readings from Last 1 Encounters:  07/25/17 5\' 10"  (1.778 m)    Weight:   Wt Readings from Last 1 Encounters:  07/26/17 207 lb 3.7 oz (94 kg)    Ideal Body Weight:  75.45 kg  BMI:  Body mass index is 29.73 kg/m.  Estimated Nutritional Needs:   Kcal:  2027  Protein:  >/= 141 grams (1.5 grams/kg)  Fluid:  >2 L/day     Trenton GammonJessica Shawnika Pepin, MS,  RD, LDN, [facility] Inpatient Clinical Dietitian Pager # [phone] After hours/weekend pager # [phone]

## 2017-07-26 NOTE — Progress Notes (Signed)
[facility] Progress Note    Assessment/ Plan:   1.  Acute oliguric AKI: severe AKI.  CRRT started 12/5.  All 4K baths, no heparin.  Increase fluid removal to 50 mL/ hr as tolerated.  Had increased pressor requirement overnight, now is being weaned.  CVP at 0400 was 8; total body vol up so will keep close eye on fluid balance  2.  Perforated viscus: s/p ileocecectomy 12/6.  + end-ileostomy.  Per CCS  3.  Shock: septic.  + BC with E coli.  On broad-spectrum antibiotics.  Per PCCM.    4.  Acute hypoxic RF: per PCCM.  5.  Metabolic acidosis: this is being corrected with CRRT  Subjective:    S/p ileocecectomy for progressive bowel ischemia.  UOP declining.     Objective:   BP (!) 82/59 (BP Location: Other (Comment)) Comment (BP Location): ART line  Pulse 65   Temp (!) 97.3 F (36.3 C) (Oral)   Resp (!) 22   Ht _0  (1.778 m)   Wt 94 kg (207 lb 3.7 oz)   SpO2 100%   BMI 29.73 kg/m   Intake/Output Summary (Last 24 hours) at 07/26/2017 1323 Last data filed at 07/26/2017 1300 Gross per 24 hour  Intake 3039.62 ml  Output 1980 ml  Net 1059.62 ml   Weight change: -0.5 kg (-1.6 oz)  Physical Exam: GEN ill appearing, intubated and sedated HEENT eyes closed, NGT with bilious output NECK R IJ nontunneled HD cath in place PULM coarse bilaterally CV tachy ABD minimal BS, + ostomy in place, midline incision dressed EXT 2+ upper and lower extremity edema NEURO intubated and sedated SKIN no rashes   Imaging: Dg Chest Port 1 View  Result Date: 07/25/2017 CLINICAL DATA:  Respiratory failure. EXAM: PORTABLE CHEST 1 VIEW COMPARISON:  07/24/2017. FINDINGS: Endotracheal tube, NG tube, right IJ line, left PICC line in stable position. Cardiomegaly with bilateral pulmonary infiltrates/edema and bilateral pleural effusions. Findings consistent congestive heart failure. No pneumothorax . IMPRESSION: 1.  Lines and tubes in stable position. 2. Cardiomegaly with bilateral  pulmonary infiltrates and bilateral pleural effusions consistent with congestive heart failure. Electronically Signed   By: Marcello Moores  Register   On: 07/25/2017 08:03   Dg Chest Port 1 View  Result Date: 07/24/2017 CLINICAL DATA:  Central line placement EXAM: PORTABLE CHEST 1 VIEW COMPARISON:  Portable exam 1616 hours compared to 0420 hours FINDINGS: Tip of endotracheal tube projects 5.0 cm above carina. Nasogastric tube extends into stomach. RIGHT jugular central venous catheter with tip projecting over proximal SVC. LEFT subclavian central venous catheter with tip projecting over mid SVC. Stable heart size mediastinal contours. Bibasilar opacities greater on LEFT question atelectasis versus consolidation. Upper lungs clear. Tiny LEFT pleural effusion. No pneumothorax. IMPRESSION: Persistent bibasilar opacities LEFT greater than RIGHT question atelectasis versus infiltrate. No pneumothorax following RIGHT jugular line placement. Electronically Signed   By: Lavonia Dana M.D.   On: 07/24/2017 16:36    Labs: BMET Recent Labs  Lab 07/24/17 0539 07/24/17 1235 07/24/17 2036 07/25/17 0355 07/25/17 1549 07/25/17 2012 07/26/17 0430  NA 133* 133* 131* 134* 139 134* 136  K 5.1 4.9 4.3 4.3 4.0 4.4 4.6  CL 108 105 101 99* 98* 99* 100*  CO2 14* 18* 19* _1 GLUCOSE 106* 109* 148* 111* 122* 136* 135*  BUN 48* 52* 45* 39* 39* 38* 40*  CREATININE 4.32* 4.54* 3.56* 3.02* 2.59* 2.64* 2.85*  CALCIUM 6.8* 6.2* 5.7* 6.6* 5.9*  6.6* 6.7*  PHOS 3.3 4.2 4.6 4.4 4.2 5.0* 4.7*   CBC Recent Labs  Lab 07/23/17 1455  07/23/17 1823 07/23/17 1914 07/24/17 0350 07/25/17 0355  WBC 9.1  --   --   --  8.8 11.9*  NEUTROABS 7.4  --   --   --  7.4  --   HGB 18.1*   < > 14.3 13.6 14.5 13.1  HCT 51.6   < > 42.0 40.0 41.7 37.9*  MCV 92.8  --   --   --  91.2 90.2  PLT 283  --   --   --  226 90*   < > = values in this interval not displayed.    Medications:    . chlorhexidine gluconate (MEDLINE KIT)  15 mL  Mouth Rinse BID  . Chlorhexidine Gluconate Cloth  6 each Topical Daily  . folic acid  1 mg Intravenous Daily  . heparin  5,000 Units Subcutaneous Q8H  . insulin aspart  0-9 Units Subcutaneous Q6H  . mouth rinse  15 mL Mouth Rinse QID  . pantoprazole (PROTONIX) IV  40 mg Intravenous Q12H  . sodium chloride flush  10-40 mL Intracatheter Q12H  . thiamine injection  100 mg Intravenous Daily      Madelon Lips, MD [facility] pgr [phone] 07/26/2017, 1:23 PM

## 2017-07-26 NOTE — Progress Notes (Signed)
Vasopressin stopped due to hypertension. MD at bedside.

## 2017-07-26 NOTE — Progress Notes (Signed)
1 Day Post-Op    [id number] pain  Subjective: Pt went back to OR yesterday and had ileocecectomy for progressive ischemia of cecum.  End ileostomy created.  Continues on CRRT and levophed turned back up.     Objective: Vital signs in last 24 hours: Temp:  [97.1 F (36.2 C)-98.6 F (37 C)] 97.1 F (36.2 C) (12/07 0900) Pulse Rate:  [45-120] 72 (12/07 1100) Resp:  [19-22] 22 (12/07 1100) BP: (79-85)/(56-61) 82/59 (12/07 0400) SpO2:  [100 %] 100 % (12/07 1100) Arterial Line BP: (78-117)/(57-73) 105/66 (12/07 1100) FiO2 (%):  [40 %] 40 % (12/07 1210) Weight:  [94 kg (207 lb 3.7 oz)] 94 kg (207 lb 3.7 oz) (12/07 0400)  Intake/Output from previous day: 12/06 0701 - 12/07 0700 In: 2897.5 [I.V.:2157.5; IV Piggyback:690] Out: 2270 [Urine:318; Emesis/NG output:75; Stool:5; Blood:50] Intake/Output this shift: Total I/O In: 1046.5 [I.V.:228.5; Other:18; IV Piggyback:800] Out: 333 [Urine:9; Other:324]  General appearance: Sedated on the vent.   Resp: clear to auscultation bilaterally Cardio: Sinus tachycardia GI: one piece ostomy bag in place with no visualization.  However, ostomy still protuberant and some liquid in bag.  Abd distended with midline dressing c/d/i.    Lab Results:  Recent Labs    07/24/17 0350 07/25/17 0355  WBC 8.8 11.9*  HGB 14.5 13.1  HCT 41.7 37.9*  PLT 226 90*    BMET Recent Labs    07/25/17 2012 07/26/17 0430  NA 134* 136  K 4.4 4.6  CL 99* 100*  CO2 22 25  GLUCOSE 136* 135*  BUN 38* 40*  CREATININE 2.64* 2.85*  CALCIUM 6.6* 6.7*   PT/INR Recent Labs    07/23/17 1455 07/24/17 0350  LABPROT 20.5* 20.5*  INR 1.78 1.77    Recent Labs  Lab 07/23/17 1455 07/24/17 1235 07/24/17 2036 07/25/17 0355 07/25/17 1549 07/25/17 2012 07/26/17 0430  AST 47* 56*  --  75*  --   --   --   ALT 13* 24  --  29  --   --   --   ALKPHOS 78 32*  --  43  --   --   --   BILITOT 1.8* 0.8  --  0.8  --   --   --   PROT 7.2 4.2*  --  4.7*  --   --   --    ALBUMIN 3.3* 1.6*  1.7* 1.7* 1.8* 1.7* 1.6* 1.6*     Lipase     Component Value Date/Time   LIPASE 47 07/23/2017 1455     Medications: . chlorhexidine gluconate (MEDLINE KIT)  15 mL Mouth Rinse BID  . Chlorhexidine Gluconate Cloth  6 each Topical Daily  . folic acid  1 mg Intravenous Daily  . heparin  5,000 Units Subcutaneous Q8H  . insulin aspart  0-9 Units Subcutaneous Q6H  . mouth rinse  15 mL Mouth Rinse QID  . pantoprazole (PROTONIX) IV  40 mg Intravenous Q12H  . sodium chloride flush  10-40 mL Intracatheter Q12H  . thiamine injection  100 mg Intravenous Daily   . anidulafungin Stopped (07/25/17 2240)  . fentaNYL infusion INTRAVENOUS 200 mcg/hr (07/26/17 1100)  . norepinephrine (LEVOPHED) Adult infusion 18 mcg/min (07/26/17 1105)  . piperacillin-tazobactam (ZOSYN)  IV Stopped (07/26/17 0934)  . dialysis replacement fluid (prismasate) 500 mL/hr at 07/26/17 1034  . dialysis replacement fluid (prismasate) 200 mL/hr at 07/25/17 1543  . dialysate (PRISMASATE) 1,500 mL/hr at 07/26/17 0836  . propofol (DIPRIVAN) infusion 7.979 mcg/kg/min (07/26/17 1100)  .  sodium chloride    . TPN (CLINIMIX) Adult without lytes    . vasopressin (PITRESSIN) infusion - *FOR SHOCK* 0.03 Units/min (07/26/17 1100)   Anti-infectives (From admission, onward)   Start     Dose/Rate Route Frequency Ordered Stop   07/25/17 2000  vancomycin (VANCOCIN) IVPB 1000 mg/200 mL premix  Status:  Discontinued     1,000 mg 200 mL/hr over 60 Minutes Intravenous Every 48 hours 07/23/17 2042 07/24/17 0956   07/24/17 2200  piperacillin-tazobactam (ZOSYN) 3.375 g in dextrose 5 % 50 mL IVPB     3.375 g 100 mL/hr over 30 Minutes Intravenous Every 6 hours 07/24/17 2006     07/24/17 2100  anidulafungin (ERAXIS) 100 mg in sodium chloride 0.9 % 100 mL IVPB     100 mg 78 mL/hr over 100 Minutes Intravenous Every 24 hours 07/23/17 2039     07/24/17 0000  anidulafungin (ERAXIS) 100 mg in sodium chloride 0.9 % 100 mL IVPB   Status:  Discontinued     100 mg 78 mL/hr over 100 Minutes Intravenous Every 24 hours 07/23/17 2038 07/23/17 2039   07/23/17 2200  piperacillin-tazobactam (ZOSYN) IVPB 3.375 g  Status:  Discontinued     3.375 g 12.5 mL/hr over 240 Minutes Intravenous Every 8 hours 07/23/17 2043 07/24/17 2006   07/23/17 2100  anidulafungin (ERAXIS) 200 mg in sodium chloride 0.9 % 200 mL IVPB     200 mg 78 mL/hr over 200 Minutes Intravenous  Once 07/23/17 2038 07/24/17 0239   07/23/17 2100  vancomycin (VANCOCIN) 1,500 mg in sodium chloride 0.9 % 500 mL IVPB     1,500 mg 250 mL/hr over 120 Minutes Intravenous  Once 07/23/17 2041 07/23/17 2317   07/23/17 1515  piperacillin-tazobactam (ZOSYN) IVPB 3.375 g     3.375 g 100 mL/hr over 30 Minutes Intravenous  Once 07/23/17 1511 07/23/17 1611     Assessment/Plan Perforated distal small bowel and ischemia of cecum S/P exploratory laparotomy, small bowel resection without anastomosis, placement of open wound VAC system 07/23/17 Dr. Randall Hiss Wilson/Dr. Fanny Skates takeback 12/6 to OR with ileocecectomy/end ileostomy and closure of fascia with packing of skin.  Sepsis/shock -levophed and vasopressin Gram Negative Bacteremia - Zosyn Acute respiratory failure - Ventilator per CCM Acute renal failure - CRRT History of heavy alcohol use - sedated on ventilatior History of tobacco use  FEN:  IV fluids.  Start TPN as I would not want to start any feeds with this level of pressors.   ID: broad spectrum antibiotics.   DVT: SCDs Follow-up: Dr. Greer Pickerel   Pt with high risk of abscess development given level of contamination at original surgery. Discussed that we are not sure he will survive this event with nephew and sister.    Supporting patient while his body makes attempts to recover.     [name] 07/26/2017

## 2017-07-26 NOTE — Progress Notes (Signed)
PHARMACY - ADULT TOTAL PARENTERAL NUTRITION CONSULT NOTE   Pharmacy Consult for TPN Indication: prolonged ileus  Patient Measurements: Body mass index is 29.73 kg/m. Filed Weights   07/23/17 1444 07/25/17 0415 07/26/17 0400  Weight: 175 lb (79.4 kg) 208 lb 5.4 oz (94.5 kg) 207 lb 3.7 oz (94 kg)    HPI: [name] admitted on 12/4 with c/o severe and worsening abdominal distention, pain, nonbloody, nonbilious, non-coffee-ground emesis and lack of bowel movements x3 days.  No significant PMH.  He was found to have perforated small bowel s/p repair in OR, and remains intubated/sedated on pressors postop.  Pharmacy is consulted to dose TPN.  Significant events:  12/4 SB resection, open wound VAC.  NG tube placed 12/5 Started CRRT 12/6 Repeat OR on for washout, ileocecectomy, creation of end ileostomy,closure of abdominal wall  Insulin requirements past 24 hours: none  Current Nutrition: NPO - Note that Propofol infusion provides 1.1 kcal/ml   IVF: see prismasol CRRT orders  Central access: CVC triple lumen placed 12/4 TPN start date: 12/7  ASSESSMENT                                                                                                          Today:   Glucose:  At goal < 150 (no hx of DM)  Electrolytes:  Na, K, Mag wnl.  CorrCa low at 8.6, Phos elevated at 4.7  Renal:  SCr increased to 2.85 on CRRT.  Minimal UOP, anuric.  Keeping even fluid balance with CRRT.  LFTs:  AST ~ 2x ULN; ALT, Tbili, AlkPhos WNL (12/6)  TGs:  Elevated on propofol: 233 (12/5), 400 (12/6), 281 (12/7)  Prealbumin:  9.3 (12/5)   NUTRITIONAL GOALS                                                                                             RD recs (12/7):  >/= 141g Protein, 2027 Kcal  Based on published guidelines, while the patient meets ICU status the initiation of lipids is being delayed for 7 days or until transition out of ICU. Planned start date for lipids is 12/14.  Goal will be to meet  100% of the patient's protein needs and approximately 80% of their caloric needs.  Clinimix 5/15 @ 115 mL/hr to provide 138 grams protein (98% of goal) and 1960 kcal (97% of goal)  Glucose infusion rate will be 3.06 mg/kg/min (Maximum 5 mg/kg/min)   PLAN  At 1800 today:  Start Clinimix (NO ELYTES) 5/15 at 40 ml/hr.  HOLD lipids while ICU status.  Plan to advance as tolerated to the goal rate.  TPN to contain standard multivitamins and trace element daily.  IVF per MD.  Add CBGs and sensitive SSI q6h.   TPN lab panels on Mondays & Thursdays.  Lynann Beaverhristine Vanda Waskey PharmD, BCPS Pager [phone] 07/26/2017 10:57 AM

## 2017-07-27 ENCOUNTER — Inpatient Hospital Stay (HOSPITAL_COMMUNITY): Payer: Non-veteran care

## 2017-07-27 DIAGNOSIS — K631 Perforation of intestine (nontraumatic): Secondary | ICD-10-CM

## 2017-07-27 DIAGNOSIS — Z932 Ileostomy status: Secondary | ICD-10-CM

## 2017-07-27 LAB — GLUCOSE, CAPILLARY
Glucose-Capillary: 100 mg/dL — ABNORMAL HIGH (ref 65–99)
Glucose-Capillary: 102 mg/dL — ABNORMAL HIGH (ref 65–99)
Glucose-Capillary: 114 mg/dL — ABNORMAL HIGH (ref 65–99)
Glucose-Capillary: 119 mg/dL — ABNORMAL HIGH (ref 65–99)
Glucose-Capillary: 97 mg/dL (ref 65–99)

## 2017-07-27 LAB — CBC
HEMATOCRIT: 17.9 % — AB (ref 39.0–52.0)
Hemoglobin: 6.4 g/dL — CL (ref 13.0–17.0)
MCH: 31.4 pg (ref 26.0–34.0)
MCHC: 35.8 g/dL (ref 30.0–36.0)
MCV: 87.7 fL (ref 78.0–100.0)
Platelets: 58 10*3/uL — ABNORMAL LOW (ref 150–400)
RBC: 2.04 MIL/uL — ABNORMAL LOW (ref 4.22–5.81)
RDW: 13.2 % (ref 11.5–15.5)
WBC: 22.4 10*3/uL — AB (ref 4.0–10.5)

## 2017-07-27 LAB — CBC WITH DIFFERENTIAL/PLATELET
BAND NEUTROPHILS: 37 %
BASOS ABS: 0 10*3/uL (ref 0.0–0.1)
BASOS ABS: 0 10*3/uL (ref 0.0–0.1)
BASOS PCT: 0 %
BLASTS: 0 %
Basophils Relative: 0 %
EOS ABS: 0 10*3/uL (ref 0.0–0.7)
EOS PCT: 0 %
Eosinophils Absolute: 0.1 10*3/uL (ref 0.0–0.7)
Eosinophils Relative: 0 %
HCT: 18.5 % — ABNORMAL LOW (ref 39.0–52.0)
HEMATOCRIT: 22 % — AB (ref 39.0–52.0)
HEMOGLOBIN: 6.3 g/dL — AB (ref 13.0–17.0)
Hemoglobin: 7.6 g/dL — ABNORMAL LOW (ref 13.0–17.0)
LYMPHS ABS: 1.4 10*3/uL (ref 0.7–4.0)
LYMPHS PCT: 7 %
LYMPHS PCT: 5 %
Lymphs Abs: 1 10*3/uL (ref 0.7–4.0)
MCH: 30 pg (ref 26.0–34.0)
MCH: 30.5 pg (ref 26.0–34.0)
MCHC: 34.1 g/dL (ref 30.0–36.0)
MCHC: 34.5 g/dL (ref 30.0–36.0)
MCV: 88.1 fL (ref 78.0–100.0)
MCV: 88.4 fL (ref 78.0–100.0)
METAMYELOCYTES PCT: 0 %
MONO ABS: 0.8 10*3/uL (ref 0.1–1.0)
MONO ABS: 1.3 10*3/uL — AB (ref 0.1–1.0)
MONOS PCT: 4 %
Monocytes Relative: 6 %
Myelocytes: 0 %
NEUTROS ABS: 18.1 10*3/uL — AB (ref 1.7–7.7)
NEUTROS ABS: 19.3 10*3/uL — AB (ref 1.7–7.7)
Neutrophils Relative %: 52 %
Neutrophils Relative %: 89 %
OTHER: 0 %
Platelets: 38 10*3/uL — ABNORMAL LOW (ref 150–400)
Platelets: 42 10*3/uL — ABNORMAL LOW (ref 150–400)
Promyelocytes Absolute: 0 %
RBC: 2.1 MIL/uL — ABNORMAL LOW (ref 4.22–5.81)
RBC: 2.49 MIL/uL — AB (ref 4.22–5.81)
RDW: 13.3 % (ref 11.5–15.5)
RDW: 13.7 % (ref 11.5–15.5)
WBC Morphology: INCREASED
WBC: 20.3 10*3/uL — ABNORMAL HIGH (ref 4.0–10.5)
WBC: 21.6 10*3/uL — AB (ref 4.0–10.5)
nRBC: 0 /100 WBC

## 2017-07-27 LAB — COMPREHENSIVE METABOLIC PANEL
ALT: 28 U/L (ref 17–63)
AST: 81 U/L — AB (ref 15–41)
Albumin: 1.5 g/dL — ABNORMAL LOW (ref 3.5–5.0)
Alkaline Phosphatase: 55 U/L (ref 38–126)
Anion gap: 9 (ref 5–15)
BILIRUBIN TOTAL: 1.1 mg/dL (ref 0.3–1.2)
BUN: 52 mg/dL — AB (ref 6–20)
CO2: 23 mmol/L (ref 22–32)
CREATININE: 2.71 mg/dL — AB (ref 0.61–1.24)
Calcium: 6.5 mg/dL — ABNORMAL LOW (ref 8.9–10.3)
Chloride: 102 mmol/L (ref 101–111)
GFR calc Af Amer: 27 mL/min — ABNORMAL LOW (ref >60)
GFR, EST NON AFRICAN AMERICAN: 23 mL/min — AB (ref >60)
Glucose, Bld: 136 mg/dL — ABNORMAL HIGH (ref 65–99)
POTASSIUM: 4.4 mmol/L (ref 3.5–5.1)
Sodium: 134 mmol/L — ABNORMAL LOW (ref 135–145)
TOTAL PROTEIN: 4.4 g/dL — AB (ref 6.5–8.1)

## 2017-07-27 LAB — RENAL FUNCTION PANEL
Albumin: 1.5 g/dL — ABNORMAL LOW (ref 3.5–5.0)
Albumin: 1.7 g/dL — ABNORMAL LOW (ref 3.5–5.0)
Anion gap: 9 (ref 5–15)
Anion gap: 9 (ref 5–15)
BUN: 52 mg/dL — ABNORMAL HIGH (ref 6–20)
BUN: 46 mg/dL — AB (ref 6–20)
CALCIUM: 6.8 mg/dL — AB (ref 8.9–10.3)
CHLORIDE: 103 mmol/L (ref 101–111)
CHLORIDE: 103 mmol/L (ref 101–111)
CO2: 24 mmol/L (ref 22–32)
CO2: 26 mmol/L (ref 22–32)
CREATININE: 2.68 mg/dL — AB (ref 0.61–1.24)
CREATININE: 2.19 mg/dL — AB (ref 0.61–1.24)
Calcium: 6.7 mg/dL — ABNORMAL LOW (ref 8.9–10.3)
GFR calc non Af Amer: 24 mL/min — ABNORMAL LOW (ref >60)
GFR calc non Af Amer: 30 mL/min — ABNORMAL LOW (ref >60)
GFR, EST AFRICAN AMERICAN: 27 mL/min — AB (ref >60)
GFR, EST AFRICAN AMERICAN: 35 mL/min — AB (ref >60)
GLUCOSE: 138 mg/dL — AB (ref 65–99)
Glucose, Bld: 105 mg/dL — ABNORMAL HIGH (ref 65–99)
Phosphorus: 2.9 mg/dL (ref 2.5–4.6)
Phosphorus: 2.7 mg/dL (ref 2.5–4.6)
Potassium: 4.5 mmol/L (ref 3.5–5.1)
Potassium: 4.5 mmol/L (ref 3.5–5.1)
SODIUM: 138 mmol/L (ref 135–145)
Sodium: 136 mmol/L (ref 135–145)

## 2017-07-27 LAB — MAGNESIUM: Magnesium: 2.5 mg/dL — ABNORMAL HIGH (ref 1.7–2.4)

## 2017-07-27 LAB — TRIGLYCERIDES: Triglycerides: 213 mg/dL — ABNORMAL HIGH (ref <150)

## 2017-07-27 LAB — PREALBUMIN

## 2017-07-27 LAB — CULTURE, BLOOD (ROUTINE X 2)

## 2017-07-27 LAB — PREPARE RBC (CROSSMATCH)

## 2017-07-27 LAB — PHOSPHORUS: Phosphorus: 2.9 mg/dL (ref 2.5–4.6)

## 2017-07-27 MED ORDER — TRACE MINERALS CR-CU-MN-SE-ZN 10-1000-500-60 MCG/ML IV SOLN
INTRAVENOUS | Status: AC
  Administered 2017-07-27: 18:00:00 via INTRAVENOUS
  Filled 2017-07-27: qty 1200

## 2017-07-27 MED ORDER — CHLORHEXIDINE GLUCONATE 0.12 % MT SOLN
OROMUCOSAL | Status: AC
  Administered 2017-07-27: 15 mL via OROMUCOSAL
  Filled 2017-07-27: qty 15

## 2017-07-27 MED ORDER — SODIUM CHLORIDE 0.9 % IV SOLN
Freq: Once | INTRAVENOUS | Status: AC
  Administered 2017-07-27: 13:00:00 via INTRAVENOUS

## 2017-07-27 MED ORDER — ALTEPLASE 2 MG IJ SOLR
2.0000 mg | Freq: Once | INTRAMUSCULAR | Status: AC
  Administered 2017-07-27: 2 mg
  Filled 2017-07-27 (×2): qty 2

## 2017-07-27 MED ORDER — ALTEPLASE 2 MG IJ SOLR
2.0000 mg | Freq: Once | INTRAMUSCULAR | Status: AC
  Administered 2017-07-27: 2 mg

## 2017-07-27 NOTE — Progress Notes (Signed)
  Recent Labs  Lab 07/25/17 0355 07/27/17 0500 07/27/17 1056  HGB 13.1 6.4* 6.3*  HCT 37.9* 17.9* 18.5*  WBC 11.9* 22.4* 20.3*  PLT 90* 58* 38*     Plan Dc heparin Check hitt panel 1 unit prbc Recheck cbc 7pm 07/27/2017  Dr. Kalman ShanMurali Rhondalyn Clingan, M.D., F.C.C.P Pulmonary and Critical Care Medicine Staff Physician, [facility] Center Director - Interstitial Lung Disease  Program  Pulmonary Fibrosis [facility] [place], [place], [id number]  Pager: [phone], If no answer or between  15:00h - 7:00h: call [phone] Telephone: [phone]

## 2017-07-27 NOTE — Progress Notes (Signed)
Pt had a 10 beat run of v-tach, made E-link, MD aware.

## 2017-07-27 NOTE — Progress Notes (Signed)
Unable to get CRRT to run, paged Nephrology.

## 2017-07-27 NOTE — Progress Notes (Signed)
PHARMACY - ADULT TOTAL PARENTERAL NUTRITION CONSULT NOTE   Pharmacy Consult for TPN Indication: prolonged ileus  Patient Measurements: Body mass index is 29.07 kg/m. Filed Weights   07/25/17 0415 07/26/17 0400 07/27/17 0500  Weight: 208 lb 5.4 oz (94.5 kg) 207 lb 3.7 oz (94 kg) 202 lb 9.6 oz (91.9 kg)    HPI: [name] admitted on 12/4 with c/o severe and worsening abdominal distention, pain, nonbloody, nonbilious, non-coffee-ground emesis and lack of bowel movements x3 days.  No significant PMH.  He was found to have perforated small bowel s/p repair in OR, and remains intubated/sedated on pressors postop.  Pharmacy is consulted to dose TPN.  Significant events:  12/4 SB resection, open wound VAC.  NG tube placed 12/5 Started CRRT 12/6 Repeat OR on for washout, ileocecectomy, creation of end ileostomy,closure of abdominal wall  Insulin requirements past 24 hours: none  Current Nutrition: NPO  IVF: see prismasol CRRT orders  Central access: CVC triple lumen placed 12/4 TPN start date: 12/7  ASSESSMENT                                                                                                          Today:   Glucose:  At goal < 150 (no hx of DM)  Electrolytes:  Na, K, Phos wnl. Mg slightly elevated. CorrCa low at 8.5.  Renal:  SCr down 2.68 on CRRT.  Minimal UOP, anuric. Goal fluid removal 4050ml/hr with CRRT.  LFTs:  AST ~ 2x ULN; ALT, Tbili, AlkPhos WNL   TGs:  Elevated, now off propofol: 233 (12/5), 400 (12/6), 281 (12/7)  Prealbumin:  9.3 (12/5)   NUTRITIONAL GOALS                                                                                             RD recs (12/7):  >/= 141g Protein, 2027 Kcal  Based on published guidelines, while the patient meets ICU status the initiation of lipids is being delayed for 7 days or until transition out of ICU. Planned start date for lipids is 12/14.  Goal will be to meet 100% of the patient's protein needs and approximately  80% of their caloric needs.  Clinimix 5/15 @ 115 mL/hr to provide 138 grams protein (98% of goal) and 1960 kcal (97% of goal)  Glucose infusion rate will be 3.06 mg/kg/min (Maximum 5 mg/kg/min)   PLAN  At 1800 today:  Increase Clinimix (NO ELYTES) 5/15 to 50 ml/hr.  HOLD lipids while ICU status.  Plan to advance as tolerated to the goal rate.  TPN to contain standard multivitamins and trace element daily.  IVF per MD.  Continue CBGs and sensitive SSI q6h.   TPN lab panels on Mondays & Thursdays.  Loralee PacasErin Adom Schoeneck, PharmD, BCPS Pager: [phone] 07/27/2017 8:23 AM

## 2017-07-27 NOTE — Progress Notes (Signed)
[facility] Progress Note    Assessment/ Plan:   1.  Acute oliguric AKI: severe AKI.  CRRT started 12/5.  Today is off pressors, starting to make some urine.  Will ^UF to 100 - 200 cc/ hr net negative.  2.  Perforated viscus: s/p ileocecectomy 12/6.  + end-ileostomy.  Per CCS  3.  Shock: septic.  + BC with E coli.  On broad-spectrum antibiotics.  Per PCCM.    4.  Acute hypoxic RF: per PCCM.  5.  Metabolic acidosis: corrected with CRRT  6.  Vol excess/ anasarca: sig edema LE's / UE's/ scrotal, ^UF with CRRT  Subjective:    Still on the vent   Objective:   BP 129/61   Pulse 97   Temp 98 F (36.7 C) (Axillary)   Resp (!) 22   Ht '5\' 10"'$  (1.778 m)   Wt 91.9 kg (202 lb 9.6 oz)   SpO2 100%   BMI 29.07 kg/m   Intake/Output Summary (Last 24 hours) at 07/27/2017 1648 Last data filed at 07/27/2017 1609 Gross per 24 hour  Intake 2359.99 ml  Output 3330 ml  Net -970.01 ml   Weight change: -2.1 kg (-10.1 oz)  Physical Exam: GEN ill appearing, intubated and sedated HEENT eyes closed, NGT with bilious output NECK R IJ nontunneled HD cath in place PULM coarse bilaterally CV tachy ABD minimal BS, + ostomy in place, midline incision dressed EXT 2+ upper and lower extremity edema NEURO intubated and sedated SKIN no rashes   Imaging: Dg Chest Port 1 View  Result Date: 07/27/2017 CLINICAL DATA:  Followup ventilator support. EXAM: PORTABLE CHEST 1 VIEW COMPARISON:  07/25/2017 FINDINGS: Endotracheal tube tip is 4 cm above the carina. Nasogastric tube enters the abdomen. Left subclavian and right internal jugular central lines are unchanged with tips in the SVC above the right atrium. Slightly less pulmonary edema. Persistent effusions and lower lobe collapse. IMPRESSION: Slightly less pulmonary edema. Persistent effusions and lower lobe collapse. Electronically Signed   By: Nelson Chimes M.D.   On: 07/27/2017 07:44    Labs: BMET Recent Labs  Lab 07/24/17 2036  07/25/17 0355 07/25/17 1549 07/25/17 2012 07/26/17 0430 07/26/17 1608 07/27/17 0500  NA 131* 134* 139 134* 136 136 136  134*  K 4.3 4.3 4.0 4.4 4.6 4.2 4.5  4.4  CL 101 99* 98* 99* 100* 103 103  102  CO2 19* '24 25 22 25 24 24  23  '$ GLUCOSE 148* 111* 122* 136* 135* 115* 138*  136*  BUN 45* 39* 39* 38* 40* 45* 52*  52*  CREATININE 3.56* 3.02* 2.59* 2.64* 2.85* 2.69* 2.68*  2.71*  CALCIUM 5.7* 6.6* 5.9* 6.6* 6.7* 6.3* 6.7*  6.5*  PHOS 4.6 4.4 4.2 5.0* 4.7* 4.0 2.9  2.9   CBC Recent Labs  Lab 07/23/17 1455  07/24/17 0350 07/25/17 0355 07/27/17 0500 07/27/17 1056  WBC 9.1  --  8.8 11.9* 22.4* 20.3*  NEUTROABS 7.4  --  7.4  --   --  18.1*  HGB 18.1*   < > 14.5 13.1 6.4* 6.3*  HCT 51.6   < > 41.7 37.9* 17.9* 18.5*  MCV 92.8  --  91.2 90.2 87.7 88.1  PLT 283  --  226 90* 58* 38*   < > = values in this interval not displayed.    Medications:    . chlorhexidine gluconate (MEDLINE KIT)  15 mL Mouth Rinse BID  . Chlorhexidine Gluconate Cloth  6 each Topical  Daily  . folic acid  1 mg Intravenous Daily  . insulin aspart  0-9 Units Subcutaneous Q6H  . mouth rinse  15 mL Mouth Rinse QID  . pantoprazole (PROTONIX) IV  40 mg Intravenous Q12H  . sodium chloride flush  10-40 mL Intracatheter Q12H  . thiamine injection  100 mg Intravenous Daily      Kelly Splinter MD [employer] pgr [phone]   07/27/2017, 4:50 PM

## 2017-07-27 NOTE — Progress Notes (Signed)
PULMONARY / CRITICAL CARE MEDICINE   Name: [facility] MRN: [id number] DOB: 30-Aug-1955    ADMISSION DATE:  07/23/2017 CONSULTATION DATE: 07/23/2017   CHIEF COMPLAINT: Abdominal pain  Brief  This is an otherwise healthy 61 year old who began having severe abdominal pain on Sunday evening.  Onset was acute and the pain is remained localized to the midline anteriorly.  He has not had any pain radiating through to his back.  He has not had any bowel movement since Sunday morning.  He has had some nausea but no vomiting.  He has had no p.o. intake since that time.  Chest x-ray obtained in the emergency room shows free air.  He denies a history of prior abdominal surgeries.  Denies a history of weight loss or overt blood in the stool. Pertinent to his surgical risk, he has no known history of coronary artery disease, chest pain myocardial infarction, CHF, or arrhythmias.  He does have a very distant history of pulmonary embolism.  He gives no history consistent with chronic lung disease, he reports normal exercise tolerance and no difficulty with unusual dyspnea.  He has never had any significant exposure to anesthesia in the past his only surgical procedure was debridement of a finger on his right hand.  12/7 - Postop, increased pressor requirements overnight   SUBJECTIVE/OVERNIGHT/INTERVAL HX 07/27/17 - 6gm% hgb drop in 2d without overt bleeding and came off pressors 07/27/2017. Making urine 30cc/h per RN. On CRRT. 40% fio2  VITAL SIGNS: BP 111/65   Pulse 77   Temp 98.4 F (36.9 C) (Oral)   Resp (!) 22   Ht 5\' 10"  (1.778 m)   Wt 91.9 kg (202 lb 9.6 oz)   SpO2 100%   BMI 29.07 kg/m   HEMODYNAMICS: CVP:  [6 mmHg] 6 mmHg  VENTILATOR SETTINGS: Vent Mode: PRVC FiO2 (%):  [40 %] 40 % Set Rate:  [22 bmp] 22 bmp Vt Set:  [580 mL] 580 mL PEEP:  [5 cmH20] 5 cmH20 Plateau Pressure:  [21 cmH20-24 cmH20] 24 cmH20  INTAKE / OUTPUT:  Intake/Output Summary (Last 24 hours) at 07/27/2017  1051 Last data filed at 07/27/2017 1000 Gross per 24 hour  Intake 1837.35 ml  Output 2578 ml  Net -740.65 ml     PHYSICAL EXAMINATION:  General Appearance:    Looks criticall ill  Head:    Normocephalic, without obvious abnormality, atraumatic  Eyes:    PERRL - yes, conjunctiva/corneas - clear      Ears:    Normal external ear canals, both ears  Nose:   NG tube - yes  Throat:  ETT TUBE - yes , OG tube - no  Neck:   Supple,  No enlargement/tenderness/nodules     Lungs:     Clear to auscultation bilaterally, Ventilator   Synchrony - yes  Chest wall:    No deformity  Heart:    S1 and S2 normal, no murmur, CVP - no.  Pressors - no  Abdomen:     Soft, no masses, no organomegaly but tense with ostomy  Genitalia:    Not done  Rectal:   not done  Extremities:   Extremities- intact     Skin:   Intact in exposed areas .      Neurologic:   Sedation - fent and versed gtt -> RASS - -3 . Moves all 4s - yes. CAM-ICU -NA . Orientation - na      PULMONARY Recent Labs  Lab 07/23/17 1504  07/23/17 1823  07/23/17 1914 07/23/17 2100 07/24/17 0500 07/24/17 1900 07/26/17 0900 07/26/17 1535  PHART  --    < > 7.202* 7.220* 7.295* 7.368 7.320* 7.441 7.398  PCO2ART  --    < > 52.8* 41.4 39.7 26.8* 34.4 34.1 38.9  PO2ART  --    < > 323.0* 219.0* 90.2 188* 114* 104 94.2  HCO3  --    < > 20.5 16.8* 18.8* 15.0* 17.2* 22.8 23.5  TCO2 19*  --  22 18*  --   --   --   --   --   O2SAT  --    < > 100.0 100.0 95.4 99.2 97.7 97.8 96.4   < > = values in this interval not displayed.    CBC Recent Labs  Lab 07/24/17 0350 07/25/17 0355 07/27/17 0500  HGB 14.5 13.1 6.4*  HCT 41.7 37.9* 17.9*  WBC 8.8 11.9* 22.4*  PLT 226 90* 58*    COAGULATION Recent Labs  Lab 07/23/17 1455 07/24/17 0350  INR 1.78 1.77    CARDIAC  No results for input(s): TROPONINI in the last 168 hours. No results for input(s): PROBNP in the last 168 hours.   CHEMISTRY Recent Labs  Lab 07/24/17 0539   07/25/17 0355 07/25/17 1549 07/25/17 2012 07/26/17 0429 07/26/17 0430 07/26/17 1608 07/27/17 0500  NA 133*   < > 134* 139 134*  --  136 136 136  134*  K 5.1   < > 4.3 4.0 4.4  --  4.6 4.2 4.5  4.4  CL 108   < > 99* 98* 99*  --  100* 103 103  102  CO2 14*   < > 24 25 22   --  25 24 24  23   GLUCOSE 106*   < > 111* 122* 136*  --  135* 115* 138*  136*  BUN 48*   < > 39* 39* 38*  --  40* 45* 52*  52*  CREATININE 4.32*   < > 3.02* 2.59* 2.64*  --  2.85* 2.69* 2.68*  2.71*  CALCIUM 6.8*   < > 6.6* 5.9* 6.6*  --  6.7* 6.3* 6.7*  6.5*  MG 1.2*  --  1.8  --   --  2.3  --   --  2.5*  PHOS 3.3   < > 4.4 4.2 5.0*  --  4.7* 4.0 2.9  2.9   < > = values in this interval not displayed.   Estimated Creatinine Clearance: 32.2 mL/min (A) (by C-G formula based on SCr of 2.68 mg/dL (H)).   LIVER Recent Labs  Lab 07/23/17 1455 07/24/17 0350 07/24/17 1235  07/25/17 0355 07/25/17 1549 07/25/17 2012 07/26/17 0430 07/26/17 1608 07/27/17 0500  AST 47*  --  56*  --  75*  --   --   --   --  81*  ALT 13*  --  24  --  29  --   --   --   --  28  ALKPHOS 78  --  32*  --  43  --   --   --   --  55  BILITOT 1.8*  --  0.8  --  0.8  --   --   --   --  1.1  PROT 7.2  --  4.2*  --  4.7*  --   --   --   --  4.4*  ALBUMIN 3.3*  --  1.6*  1.7*   < > 1.8* 1.7* 1.6*  1.6* 1.5* 1.5*  1.5*  INR 1.78 1.77  --   --   --   --   --   --   --   --    < > = values in this interval not displayed.     INFECTIOUS Recent Labs  Lab 07/23/17 1644  07/24/17 0300 07/24/17 0539 07/24/17 1232 07/24/17 1721 07/25/17 0355  LATICACIDVEN 3.3*   < > 4.1*  --  3.3* 3.5*  --   PROCALCITON 124.45  --   --  >150.00  --   --  >150.00   < > = values in this interval not displayed.     ENDOCRINE CBG (last 3)  Recent Labs    07/26/17 1931 07/27/17 0054 07/27/17 0548  GLUCAP 134* 114* 100*         IMAGING x48h  - image(s) personally visualized  -   highlighted in bold Dg Chest Port 1 View  Result Date:  07/27/2017 CLINICAL DATA:  Followup ventilator support. EXAM: PORTABLE CHEST 1 VIEW COMPARISON:  07/25/2017 FINDINGS: Endotracheal tube tip is 4 cm above the carina. Nasogastric tube enters the abdomen. Left subclavian and right internal jugular central lines are unchanged with tips in the SVC above the right atrium. Slightly less pulmonary edema. Persistent effusions and lower lobe collapse. IMPRESSION: Slightly less pulmonary edema. Persistent effusions and lower lobe collapse. Electronically Signed   By: Paulina Fusi M.D.   On: 07/27/2017 07:44       ASSESSMENT / PLAN:   Perforated viscus involving the distal small bowel s/p exploratory laparotomy with small bowel resection and placement of abdominal wound VAC; no anastomosis made peritonitis w/ E. coli bacteremia -felt d/t diverticular disease; back to the OR 12/6 underwent reexploration and creation of an ileostomy and closure of abdominal wall - On 07/27/2017 - on TNA   Plan Continue n.p.o. status Routine wound care per surgery Day #5 anidulafludin and zosyn; awaiting final sensitivities  Septic shock -> pressor requirements have improved, is now increased again CVP suggests degree of volume depletion  07/27/2017 ->  NOT on Pressor - just came off  plan Map goal greater than 65   Ventilator dependence status post abdominal surgery Mild iatrogenic respiratory alkalosis   - 07/27/2017 - > does NOT  meet criteria for SBT/Extubation in setting of Acute Respiratory Failure due to deep sedationa nd MODS but improving and time to lighten sedation and start working towards SBT   Plan Repeat chest x-ray in a.m. Continue ventilator support for now; PAD protocol; RASS -1 VAP prevention SBT as tolerated   Acute kidney injury with progressive NAG metabolic acidosis & hyperkalemia  -> Now on CRRT, appreciate nephrology assistance  07/27/2017 - making urine at 30cc/h  Plan Avoid nephrotoxins CRRT per nephrology   H/o ETOH  abuse @ risk for w/d and acute delirium  07/27/2017 - deeply sedated. AT risk for agitated encephalopathy  Plan WUA to start Continue thiamine and folate Fentanyl andversed infusion Change RASS goal to -1    HEME Mildly elevated INR and sepsis related thrombocytopenia  -No evidence of bleeding  6gm% hgb drop in 48h ? spurious because he is off pressors 07/27/2017   Plan Repeat CBC STAT May need to transition from subcu heparin to PAS if platelets continue to drop  transfuse per protocol  DVT prophylaxis: Subcu heparin - ok for now depending on repeat platelets SUP: PPI bid  Diet: NPO Activity: Bedrest Disposition :ICU     The patient is  critically ill with multiple organ systems failure and requires high complexity decision making for assessment and support, frequent evaluation and titration of therapies, application of advanced monitoring technologies and extensive interpretation of multiple databases.   Critical Care Time devoted to patient care services described in this note is  30  Minutes. This time reflects time of care of this signee Dr Kalman Shan. This critical care time does not reflect procedure time, or teaching time or supervisory time of PA/NP/Med student/Med Resident etc but could involve care discussion time    Dr. Kalman Shan, M.D., [facility].C.P Pulmonary and Critical Care Medicine Staff Physician [facility]  Pulmonary and Critical Care Pager: [phone], If no answer or between  15:00h - 7:00h: call [phone]  07/27/2017 11:05 AM

## 2017-07-27 NOTE — Progress Notes (Signed)
Assessment Perforated distal small bowel and ischemia of cecum S/P exploratory laparotomy, small bowel resection without anastomosis, placement of open wound VAC system 07/23/17 Dr. Minerva AreolaEric Wilson/Dr. Claud KelpHaywood Ingram takeback 12/6 to OR with ileocecectomy/end ileostomy and closure of fascia with packing of skin-some ileostomy output Sepsis/shock -weaning vasopressors off Gram Negative Bacteremia - Zosyn Acute respiratory failure - Ventilator per CCM Acute renal failure - CRRT History of heavy alcohol use - sedated on ventilatior History of tobacco use FEN:   TPN .   ID: broad spectrum antibiotics.   DVT: SCDs Follow-up: Dr. Gaynelle AduEric Wilson  Pt with high risk of abscess development given level of contamination at original surgery. Prognosis guarded.   Plan:  Continue supportive care and TPN.   LOS: 4 days    Chief Complaint/Subjective: Sedated on vent  Objective: Vital signs in last 24 hours: Temp:  [96.8 F (36 C)-98.6 F (37 C)] 98.6 F (37 C) (12/07 1800) Pulse Rate:  [65-90] 77 (12/08 0600) Resp:  [22] 22 (12/08 0600) BP: (91-111)/(51-65) 111/65 (12/08 0317) SpO2:  [100 %] 100 % (12/08 0600) Arterial Line BP: (96-145)/(56-80) 113/63 (12/08 0600) FiO2 (%):  [40 %] 40 % (12/08 0750) Weight:  [91.9 kg (202 lb 9.6 oz)] 91.9 kg (202 lb 9.6 oz) (12/08 0500) Last BM Date: 07/26/17  Intake/Output from previous day: 12/07 0701 - 12/08 0700 In: 2559.8 [I.V.:1491.8; NG/GT:30; IV Piggyback:1000] Out: 2164 [Urine:264; Emesis/NG output:225; Stool:15] Intake/Output this shift: No intake/output data recorded.  PE: General- Sedated on vent Abdomen-firm, distended, open wound clean, ileostomy pink with some green stool in bag  Lab Results:  Recent Labs    07/25/17 0355 07/27/17 0500  WBC 11.9* 22.4*  HGB 13.1 6.4*  HCT 37.9* 17.9*  PLT 90* 58*   BMET Recent Labs    07/26/17 1608 07/27/17 0500  NA 136 136  134*  K 4.2 4.5  4.4  CL 103 103  102  CO2 24 24  23   GLUCOSE  115* 138*  136*  BUN 45* 52*  52*  CREATININE 2.69* 2.68*  2.71*  CALCIUM 6.3* 6.7*  6.5*   PT/INR No results for input(s): LABPROT, INR in the last 72 hours. Comprehensive Metabolic Panel:    Component Value Date/Time   NA 136 07/27/2017 0500   NA 134 (L) 07/27/2017 0500   K 4.5 07/27/2017 0500   K 4.4 07/27/2017 0500   CL 103 07/27/2017 0500   CL 102 07/27/2017 0500   CO2 24 07/27/2017 0500   CO2 23 07/27/2017 0500   BUN 52 (H) 07/27/2017 0500   BUN 52 (H) 07/27/2017 0500   CREATININE 2.68 (H) 07/27/2017 0500   CREATININE 2.71 (H) 07/27/2017 0500   GLUCOSE 138 (H) 07/27/2017 0500   GLUCOSE 136 (H) 07/27/2017 0500   CALCIUM 6.7 (L) 07/27/2017 0500   CALCIUM 6.5 (L) 07/27/2017 0500   AST 81 (H) 07/27/2017 0500   AST 75 (H) 07/25/2017 0355   ALT 28 07/27/2017 0500   ALT 29 07/25/2017 0355   ALKPHOS 55 07/27/2017 0500   ALKPHOS 43 07/25/2017 0355   BILITOT 1.1 07/27/2017 0500   BILITOT 0.8 07/25/2017 0355   PROT 4.4 (L) 07/27/2017 0500   PROT 4.7 (L) 07/25/2017 0355   ALBUMIN 1.5 (L) 07/27/2017 0500   ALBUMIN 1.5 (L) 07/27/2017 0500     Studies/Results: Dg Chest Port 1 View  Result Date: 07/27/2017 CLINICAL DATA:  Followup ventilator support. EXAM: PORTABLE CHEST 1 VIEW COMPARISON:  07/25/2017 FINDINGS: Endotracheal tube tip is  4 cm above the carina. Nasogastric tube enters the abdomen. Left subclavian and right internal jugular central lines are unchanged with tips in the SVC above the right atrium. Slightly less pulmonary edema. Persistent effusions and lower lobe collapse. IMPRESSION: Slightly less pulmonary edema. Persistent effusions and lower lobe collapse. Electronically Signed   By: Paulina FusiMark  Shogry M.D.   On: 07/27/2017 07:44    Anti-infectives: Anti-infectives (From admission, onward)   Start     Dose/Rate Route Frequency Ordered Stop   07/25/17 2000  vancomycin (VANCOCIN) IVPB 1000 mg/200 mL premix  Status:  Discontinued     1,000 mg 200 mL/hr over 60  Minutes Intravenous Every 48 hours 07/23/17 2042 07/24/17 0956   07/24/17 2200  piperacillin-tazobactam (ZOSYN) 3.375 g in dextrose 5 % 50 mL IVPB     3.375 g 100 mL/hr over 30 Minutes Intravenous Every 6 hours 07/24/17 2006     07/24/17 2100  anidulafungin (ERAXIS) 100 mg in sodium chloride 0.9 % 100 mL IVPB     100 mg 78 mL/hr over 100 Minutes Intravenous Every 24 hours 07/23/17 2039     07/24/17 0000  anidulafungin (ERAXIS) 100 mg in sodium chloride 0.9 % 100 mL IVPB  Status:  Discontinued     100 mg 78 mL/hr over 100 Minutes Intravenous Every 24 hours 07/23/17 2038 07/23/17 2039   07/23/17 2200  piperacillin-tazobactam (ZOSYN) IVPB 3.375 g  Status:  Discontinued     3.375 g 12.5 mL/hr over 240 Minutes Intravenous Every 8 hours 07/23/17 2043 07/24/17 2006   07/23/17 2100  anidulafungin (ERAXIS) 200 mg in sodium chloride 0.9 % 200 mL IVPB     200 mg 78 mL/hr over 200 Minutes Intravenous  Once 07/23/17 2038 07/24/17 0239   07/23/17 2100  vancomycin (VANCOCIN) 1,500 mg in sodium chloride 0.9 % 500 mL IVPB     1,500 mg 250 mL/hr over 120 Minutes Intravenous  Once 07/23/17 2041 07/23/17 2317   07/23/17 1515  piperacillin-tazobactam (ZOSYN) IVPB 3.375 g     3.375 g 100 mL/hr over 30 Minutes Intravenous  Once 07/23/17 1511 07/23/17 1611       [facility] Singh 07/27/2017

## 2017-07-27 NOTE — Progress Notes (Signed)
Will try Cathflo per Renal MD orders and if unable to resume CRRT will follow up tomorrow.

## 2017-07-28 ENCOUNTER — Inpatient Hospital Stay (HOSPITAL_COMMUNITY): Payer: Non-veteran care

## 2017-07-28 LAB — RENAL FUNCTION PANEL
ALBUMIN: 1.5 g/dL — AB (ref 3.5–5.0)
ANION GAP: 8 (ref 5–15)
BUN: 52 mg/dL — AB (ref 6–20)
CALCIUM: 6.9 mg/dL — AB (ref 8.9–10.3)
CO2: 26 mmol/L (ref 22–32)
Chloride: 104 mmol/L (ref 101–111)
Creatinine, Ser: 2.43 mg/dL — ABNORMAL HIGH (ref 0.61–1.24)
GFR calc Af Amer: 31 mL/min — ABNORMAL LOW (ref >60)
GFR calc non Af Amer: 27 mL/min — ABNORMAL LOW (ref >60)
GLUCOSE: 122 mg/dL — AB (ref 65–99)
PHOSPHORUS: 2.5 mg/dL (ref 2.5–4.6)
Potassium: 4.3 mmol/L (ref 3.5–5.1)
SODIUM: 138 mmol/L (ref 135–145)

## 2017-07-28 LAB — CBC WITH DIFFERENTIAL/PLATELET
BAND NEUTROPHILS: 33 %
BASOS PCT: 0 %
BASOS PCT: 1 %
Basophils Absolute: 0 10*3/uL (ref 0.0–0.1)
Basophils Absolute: 0.2 10*3/uL — ABNORMAL HIGH (ref 0.0–0.1)
Blasts: 0 %
EOS ABS: 0.2 10*3/uL (ref 0.0–0.7)
EOS ABS: 0.4 10*3/uL (ref 0.0–0.7)
EOS PCT: 2 %
Eosinophils Relative: 1 %
HCT: 19.6 % — ABNORMAL LOW (ref 39.0–52.0)
HCT: 20.5 % — ABNORMAL LOW (ref 39.0–52.0)
Hemoglobin: 7.1 g/dL — ABNORMAL LOW (ref 13.0–17.0)
Hemoglobin: 6.8 g/dL — CL (ref 13.0–17.0)
LYMPHS ABS: 1.3 10*3/uL (ref 0.7–4.0)
LYMPHS ABS: 1.8 10*3/uL (ref 0.7–4.0)
LYMPHS PCT: 9 %
Lymphocytes Relative: 7 %
MCH: 32.4 pg (ref 26.0–34.0)
MCH: 29.6 pg (ref 26.0–34.0)
MCHC: 36.2 g/dL — AB (ref 30.0–36.0)
MCHC: 33.2 g/dL (ref 30.0–36.0)
MCV: 89.5 fL (ref 78.0–100.0)
MCV: 89.1 fL (ref 78.0–100.0)
MONO ABS: 1.3 10*3/uL — AB (ref 0.1–1.0)
MONO ABS: 0.8 10*3/uL (ref 0.1–1.0)
MONOS PCT: 4 %
Metamyelocytes Relative: 0 %
Monocytes Relative: 7 %
Myelocytes: 0 %
NEUTROS ABS: 16.2 10*3/uL — AB (ref 1.7–7.7)
NEUTROS ABS: 17.1 10*3/uL — AB (ref 1.7–7.7)
NEUTROS PCT: 85 %
Neutrophils Relative %: 49 %
OTHER: 0 %
PLATELETS: 53 10*3/uL — AB (ref 150–400)
PLATELETS: 97 10*3/uL — AB (ref 150–400)
Promyelocytes Absolute: 2 %
RBC: 2.19 MIL/uL — ABNORMAL LOW (ref 4.22–5.81)
RBC: 2.3 MIL/uL — ABNORMAL LOW (ref 4.22–5.81)
RDW: 14.3 % (ref 11.5–15.5)
RDW: 14.3 % (ref 11.5–15.5)
WBC Morphology: INCREASED
WBC: 19 10*3/uL — ABNORMAL HIGH (ref 4.0–10.5)
WBC: 20.3 10*3/uL — ABNORMAL HIGH (ref 4.0–10.5)
nRBC: 0 /100 WBC

## 2017-07-28 LAB — BLOOD GAS, ARTERIAL
ACID-BASE EXCESS: 0.9 mmol/L (ref 0.0–2.0)
BICARBONATE: 24.1 mmol/L (ref 20.0–28.0)
Drawn by: 270211
FIO2: 0.4
LHR: 22 {breaths}/min
O2 SAT: 98.2 %
PATIENT TEMPERATURE: 98.6
PCO2 ART: 35 mmHg (ref 32.0–48.0)
PEEP/CPAP: 5 cmH2O
VT: 580 mL
pH, Arterial: 7.453 — ABNORMAL HIGH (ref 7.350–7.450)
pO2, Arterial: 114 mmHg — ABNORMAL HIGH (ref 83.0–108.0)

## 2017-07-28 LAB — GLUCOSE, CAPILLARY
GLUCOSE-CAPILLARY: 116 mg/dL — AB (ref 65–99)
Glucose-Capillary: 102 mg/dL — ABNORMAL HIGH (ref 65–99)
Glucose-Capillary: 29 mg/dL — CL (ref 65–99)
Glucose-Capillary: 114 mg/dL — ABNORMAL HIGH (ref 65–99)

## 2017-07-28 LAB — HEPATIC FUNCTION PANEL
ALT: 27 U/L (ref 17–63)
AST: 56 U/L — ABNORMAL HIGH (ref 15–41)
Albumin: 1.5 g/dL — ABNORMAL LOW (ref 3.5–5.0)
Alkaline Phosphatase: 62 U/L (ref 38–126)
BILIRUBIN INDIRECT: 0.8 mg/dL (ref 0.3–0.9)
Bilirubin, Direct: 0.6 mg/dL — ABNORMAL HIGH (ref 0.1–0.5)
TOTAL PROTEIN: 4.7 g/dL — AB (ref 6.5–8.1)
Total Bilirubin: 1.4 mg/dL — ABNORMAL HIGH (ref 0.3–1.2)

## 2017-07-28 LAB — MAGNESIUM: Magnesium: 2.4 mg/dL (ref 1.7–2.4)

## 2017-07-28 LAB — BASIC METABOLIC PANEL
Anion gap: 9 (ref 5–15)
BUN: 53 mg/dL — AB (ref 6–20)
CHLORIDE: 103 mmol/L (ref 101–111)
CO2: 25 mmol/L (ref 22–32)
Calcium: 6.9 mg/dL — ABNORMAL LOW (ref 8.9–10.3)
Creatinine, Ser: 2.44 mg/dL — ABNORMAL HIGH (ref 0.61–1.24)
GFR calc Af Amer: 31 mL/min — ABNORMAL LOW (ref >60)
GFR calc non Af Amer: 27 mL/min — ABNORMAL LOW (ref >60)
Glucose, Bld: 126 mg/dL — ABNORMAL HIGH (ref 65–99)
POTASSIUM: 4.3 mmol/L (ref 3.5–5.1)
SODIUM: 137 mmol/L (ref 135–145)

## 2017-07-28 LAB — CREATININE, SERUM
CREATININE: 2.38 mg/dL — AB (ref 0.61–1.24)
GFR calc Af Amer: 32 mL/min — ABNORMAL LOW (ref >60)
GFR calc non Af Amer: 27 mL/min — ABNORMAL LOW (ref >60)

## 2017-07-28 LAB — TRIGLYCERIDES: TRIGLYCERIDES: 279 mg/dL — AB (ref <150)

## 2017-07-28 LAB — LACTIC ACID, PLASMA: LACTIC ACID, VENOUS: 1.7 mmol/L (ref 0.5–1.9)

## 2017-07-28 MED ORDER — ACETAMINOPHEN 650 MG RE SUPP
650.0000 mg | Freq: Four times a day (QID) | RECTAL | Status: DC | PRN
  Administered 2017-07-29 – 2017-08-12 (×10): 650 mg via RECTAL
  Filled 2017-07-28 (×12): qty 1

## 2017-07-28 MED ORDER — ANTICOAGULANT SODIUM CITRATE 4% (200MG/5ML) IV SOLN
5.0000 mL | Status: DC | PRN
  Administered 2017-07-28: 2.4 mL via INTRAVENOUS_CENTRAL
  Filled 2017-07-28 (×2): qty 250

## 2017-07-28 MED ORDER — FUROSEMIDE 10 MG/ML IJ SOLN
80.0000 mg | Freq: Three times a day (TID) | INTRAMUSCULAR | Status: DC
  Administered 2017-07-28 – 2017-07-29 (×3): 80 mg via INTRAVENOUS
  Filled 2017-07-28 (×3): qty 8

## 2017-07-28 MED ORDER — DILTIAZEM HCL-DEXTROSE 100-5 MG/100ML-% IV SOLN (PREMIX)
5.0000 mg/h | INTRAVENOUS | Status: DC
  Administered 2017-07-28: 10 mg/h via INTRAVENOUS
  Filled 2017-07-28 (×2): qty 100

## 2017-07-28 MED ORDER — TRACE MINERALS CR-CU-MN-SE-ZN 10-1000-500-60 MCG/ML IV SOLN
INTRAVENOUS | Status: AC
  Administered 2017-07-28: 19:00:00 via INTRAVENOUS
  Filled 2017-07-28: qty 1800

## 2017-07-28 NOTE — Progress Notes (Signed)
Assessment Perforated distal small bowel and ischemia of cecum S/P exploratory laparotomy, small bowel resection without anastomosis, placement of open wound VAC system 07/23/17 Dr. Minerva AreolaEric Wilson/Dr. Claud KelpHaywood Ingram takeback 12/6 to OR with ileocecectomy/end ileostomy and closure of fascia with packing of skin-some ileostomy output but still with ileus Sepsis/shock - vasopressors off Gram Negative Bacteremia - Zosyn Acute respiratory failure - Ventilator per CCM Acute renal failure - CRRT History of heavy alcohol use - sedated on ventilatior History of tobacco use FEN:   TPN . Prealbumin < 5 ID: broad spectrum antibiotics.   DVT: SCDs Follow-up: Dr. Gaynelle AduEric Wilson  Pt with high risk of abscess development given level of contamination at original surgery. Prognosis guarded.   Plan:  Continue supportive care and TPN. Wait for bowel function to return before starting enteral feeds.  LOS: 5 days    Chief Complaint/Subjective: Remains sedated on vent  Objective: Vital signs in last 24 hours: Temp:  [97.6 F (36.4 C)-99.2 F (37.3 C)] 98.8 F (37.1 C) (12/09 0707) Pulse Rate:  [70-110] 95 (12/09 0700) Resp:  [22-26] 22 (12/09 0700) BP: (97-129)/(48-80) 116/72 (12/09 0700) SpO2:  [100 %] 100 % (12/09 0700) Arterial Line BP: (79-179)/(55-175) 130/62 (12/09 0600) FiO2 (%):  [40 %] 40 % (12/09 0403) Weight:  [91.8 kg (202 lb 6.1 oz)] 91.8 kg (202 lb 6.1 oz) (12/09 0402) Last BM Date: 07/27/17  Intake/Output from previous day: 12/08 0701 - 12/09 0700 In: 2119.8 [I.V.:1466.8; Blood:323; IV Piggyback:300] Out: 4128 [Urine:1122; Emesis/NG output:125; Stool:310] Intake/Output this shift: No intake/output data recorded.  PE: General- Sedated on vent Abdomen-firm, distended, open wound clean, ileostomy pink with some green stool in bag  Lab Results:  Recent Labs    07/27/17 1840 07/28/17 0500  WBC 21.6* 19.0*  HGB 7.6* 7.1*  HCT 22.0* 19.6*  PLT 42* 53*   BMET Recent Labs   07/27/17 1630 07/28/17 0500  NA 138 138  137  K 4.5 4.3  4.3  CL 103 104  103  CO2 26 26  25   GLUCOSE 105* 122*  126*  BUN 46* 52*  53*  CREATININE 2.19* 2.38*  2.43*  2.44*  CALCIUM 6.8* 6.9*  6.9*   PT/INR No results for input(s): LABPROT, INR in the last 72 hours. Comprehensive Metabolic Panel:    Component Value Date/Time   NA 137 07/28/2017 0500   NA 138 07/28/2017 0500   K 4.3 07/28/2017 0500   K 4.3 07/28/2017 0500   CL 103 07/28/2017 0500   CL 104 07/28/2017 0500   CO2 25 07/28/2017 0500   CO2 26 07/28/2017 0500   BUN 53 (H) 07/28/2017 0500   BUN 52 (H) 07/28/2017 0500   CREATININE 2.44 (H) 07/28/2017 0500   CREATININE 2.38 (H) 07/28/2017 0500   CREATININE 2.43 (H) 07/28/2017 0500   GLUCOSE 126 (H) 07/28/2017 0500   GLUCOSE 122 (H) 07/28/2017 0500   CALCIUM 6.9 (L) 07/28/2017 0500   CALCIUM 6.9 (L) 07/28/2017 0500   AST 56 (H) 07/28/2017 0500   AST 81 (H) 07/27/2017 0500   ALT 27 07/28/2017 0500   ALT 28 07/27/2017 0500   ALKPHOS 62 07/28/2017 0500   ALKPHOS 55 07/27/2017 0500   BILITOT 1.4 (H) 07/28/2017 0500   BILITOT 1.1 07/27/2017 0500   PROT 4.7 (L) 07/28/2017 0500   PROT 4.4 (L) 07/27/2017 0500   ALBUMIN 1.5 (L) 07/28/2017 0500   ALBUMIN 1.5 (L) 07/28/2017 0500     Studies/Results: Dg Chest Port [id number]  Result Date: 07/28/2017 CLINICAL DATA:  Ventilator support EXAM: PORTABLE CHEST 1 VIEW COMPARISON:  07/27/2017 FINDINGS: Endotracheal tube tip is 5 cm above the carina. Nasogastric tube enters the abdomen. Left subclavian central line and right internal jugular central line are unchanged. Persistent bilateral effusions with lower lobe volume loss. No change since yesterday. IMPRESSION: No change. Lines and tubes satisfactory. Persistent effusions and lower lobe volume loss. Electronically Signed   By: Paulina FusiMark  Shogry M.D.   On: 07/28/2017 06:27   Dg Chest Port 1 View  Result Date: 07/27/2017 CLINICAL DATA:  Followup ventilator support. EXAM:  PORTABLE CHEST 1 VIEW COMPARISON:  07/25/2017 FINDINGS: Endotracheal tube tip is 4 cm above the carina. Nasogastric tube enters the abdomen. Left subclavian and right internal jugular central lines are unchanged with tips in the SVC above the right atrium. Slightly less pulmonary edema. Persistent effusions and lower lobe collapse. IMPRESSION: Slightly less pulmonary edema. Persistent effusions and lower lobe collapse. Electronically Signed   By: Paulina FusiMark  Shogry M.D.   On: 07/27/2017 07:44    Anti-infectives: Anti-infectives (From admission, onward)   Start     Dose/Rate Route Frequency Ordered Stop   07/25/17 2000  vancomycin (VANCOCIN) IVPB 1000 mg/200 mL premix  Status:  Discontinued     1,000 mg 200 mL/hr over 60 Minutes Intravenous Every 48 hours 07/23/17 2042 07/24/17 0956   07/24/17 2200  piperacillin-tazobactam (ZOSYN) 3.375 g in dextrose 5 % 50 mL IVPB     3.375 g 100 mL/hr over 30 Minutes Intravenous Every 6 hours 07/24/17 2006     07/24/17 2100  anidulafungin (ERAXIS) 100 mg in sodium chloride 0.9 % 100 mL IVPB     100 mg 78 mL/hr over 100 Minutes Intravenous Every 24 hours 07/23/17 2039     07/24/17 0000  anidulafungin (ERAXIS) 100 mg in sodium chloride 0.9 % 100 mL IVPB  Status:  Discontinued     100 mg 78 mL/hr over 100 Minutes Intravenous Every 24 hours 07/23/17 2038 07/23/17 2039   07/23/17 2200  piperacillin-tazobactam (ZOSYN) IVPB 3.375 g  Status:  Discontinued     3.375 g 12.5 mL/hr over 240 Minutes Intravenous Every 8 hours 07/23/17 2043 07/24/17 2006   07/23/17 2100  anidulafungin (ERAXIS) 200 mg in sodium chloride 0.9 % 200 mL IVPB     200 mg 78 mL/hr over 200 Minutes Intravenous  Once 07/23/17 2038 07/24/17 0239   07/23/17 2100  vancomycin (VANCOCIN) 1,500 mg in sodium chloride 0.9 % 500 mL IVPB     1,500 mg 250 mL/hr over 120 Minutes Intravenous  Once 07/23/17 2041 07/23/17 2317   07/23/17 1515  piperacillin-tazobactam (ZOSYN) IVPB 3.375 g     3.375 g 100 mL/hr over  30 Minutes Intravenous  Once 07/23/17 1511 07/23/17 1611       Hershel Corkery 07/28/2017
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
## 2017-07-28 NOTE — Progress Notes (Signed)
PULMONARY / CRITICAL CARE MEDICINE   Name: Caleb Singh MRN: [id number] DOB: [birth year]    ADMISSION DATE:  07/23/2017 CONSULTATION DATE: 07/23/2017   CHIEF COMPLAINT: Abdominal pain  Brief  This is an otherwise healthy 61 year old who began having severe abdominal pain on Sunday evening.  Onset was acute and the pain is remained localized to the midline anteriorly.  He has not had any pain radiating through to his back.  He has not had any bowel movement since Sunday morning.  He has had some nausea but no vomiting.  He has had no p.o. intake since that time.  Chest x-ray obtained in the emergency room shows free air.  He denies a history of prior abdominal surgeries.  Denies a history of weight loss or overt blood in the stool. Pertinent to his surgical risk, he has no known history of coronary artery disease, chest pain myocardial infarction, CHF, or arrhythmias.  He does have a very distant history of pulmonary embolism.  He gives no history consistent with chronic lung disease, he reports normal exercise tolerance and no difficulty with unusual dyspnea.  He has never had any significant exposure to anesthesia in the past his only surgical procedure was debridement of a finger on his right hand.  12/7 - Postop, increased pressor requirements overnight   07/27/17 - 6gm% hgb drop in 2d without overt bleeding and came off pressors 07/28/2017. Making urine 30cc/h per RN. On CRRT. 40% fio2   SUBJECTIVE/OVERNIGHT/INTERVAL HX 07/28/2017 ->  R IJ HD cath filter clotted off 3 times. sTill 6+ L positive. On CRRT running negative. Makes  > 30cc/h urine. RN unable to wean sedation for wUA due to Rt IJ HD cath being positional. S/p 1 unit PRBC yesterday; ;likely losing blood via CRRT  VITAL SIGNS: BP 129/62   Pulse 93   Temp 98.8 F (37.1 C) (Oral)   Resp (!) 22   Ht 5\' 10"  (1.778 m)   Wt 91.8 kg (202 lb 6.1 oz)   SpO2 100%   BMI 29.04 kg/m   HEMODYNAMICS: CVP:  [9 mmHg] 9  mmHg  VENTILATOR SETTINGS: Vent Mode: PRVC FiO2 (%):  [40 %] 40 % Set Rate:  [22 bmp] 22 bmp Vt Set:  [580 mL] 580 mL PEEP:  [5 cmH20] 5 cmH20 Plateau Pressure:  [22 cmH20-25 cmH20] 25 cmH20  INTAKE / OUTPUT:  Intake/Output Summary (Last 24 hours) at 07/28/2017 7829 Last data filed at 07/28/2017 0700 Gross per 24 hour  Intake 2051.16 ml  Output 3929 ml  Net -1877.84 ml     PHYSICAL EXAMINATION:    General Appearance:    Looks criticall ill   Head:    Normocephalic, without obvious abnormality, atraumatic  Eyes:    PERRL - yes, conjunctiva/corneas - clear      Ears:    Normal external ear canals, both ears  Nose:   NG tube - no  Throat:  ETT TUBE - yes , OG tube - yes  Neck:   Supple,  No enlargement/tenderness/nodules     Lungs:     Clear to auscultation bilaterally, Ventilator   Synchrony - yes  Chest wall:    No deformity  Heart:    S1 and S2 normal, no murmur, CVP - no.  Pressors - no  Abdomen:     Soft, no masses, no organomegaly  Genitalia:    Not done  Rectal:   not done  Extremities:   Extremities- intact     Skin:  Intact in exposed areas .      Neurologic:   Sedation - fent and versed gtt -> RASS - -3 . Moves all 4s - yes. CAM-ICU - na . Orientation - na        PULMONARY Recent Labs  Lab 07/23/17 1504  07/23/17 1823 07/23/17 1914 07/23/17 2100 07/24/17 0500 07/24/17 1900 07/26/17 0900 07/26/17 1535  PHART  --    < > 7.202* 7.220* 7.295* 7.368 7.320* 7.441 7.398  PCO2ART  --    < > 52.8* 41.4 39.7 26.8* 34.4 34.1 38.9  PO2ART  --    < > 323.0* 219.0* 90.2 188* 114* 104 94.2  HCO3  --    < > 20.5 16.8* 18.8* 15.0* 17.2* 22.8 23.5  TCO2 19*  --  22 18*  --   --   --   --   --   O2SAT  --    < > 100.0 100.0 95.4 99.2 97.7 97.8 96.4   < > = values in this interval not displayed.    CBC Recent Labs  Lab 07/27/17 1056 07/27/17 1840 07/28/17 0500  HGB 6.3* 7.6* 7.1*  HCT 18.5* 22.0* 19.6*  WBC 20.3* 21.6* 19.0*  PLT 38* 42* 53*     COAGULATION Recent Labs  Lab 07/23/17 1455 07/24/17 0350  INR 1.78 1.77    CARDIAC  No results for input(s): TROPONINI in the last 168 hours. No results for input(s): PROBNP in the last 168 hours.   CHEMISTRY Recent Labs  Lab 07/24/17 0539  07/25/17 0355  07/26/17 0429 07/26/17 0430 07/26/17 1608 07/27/17 0500 07/27/17 1630 07/28/17 0500  NA 133*   < > 134*   < >  --  136 136 136  134* 138 138  137  K 5.1   < > 4.3   < >  --  4.6 4.2 4.5  4.4 4.5 4.3  4.3  CL 108   < > 99*   < >  --  100* 103 103  102 103 104  103  CO2 14*   < > 24   < >  --  25 24 24  23 26 26  25   GLUCOSE 106*   < > 111*   < >  --  135* 115* 138*  136* 105* 122*  126*  BUN 48*   < > 39*   < >  --  40* 45* 52*  52* 46* 52*  53*  CREATININE 4.32*   < > 3.02*   < >  --  2.85* 2.69* 2.68*  2.71* 2.19* 2.38*  2.43*  2.44*  CALCIUM 6.8*   < > 6.6*   < >  --  6.7* 6.3* 6.7*  6.5* 6.8* 6.9*  6.9*  MG 1.2*  --  1.8  --  2.3  --   --  2.5*  --  2.4  PHOS 3.3   < > 4.4   < >  --  4.7* 4.0 2.9  2.9 2.7 2.5   < > = values in this interval not displayed.   Estimated Creatinine Clearance: 35.3 mL/min (A) (by C-G formula based on SCr of 2.44 mg/dL (H)).   LIVER Recent Labs  Lab 07/23/17 1455 07/24/17 0350 07/24/17 1235  07/25/17 0355  07/26/17 0430 07/26/17 1608 07/27/17 0500 07/27/17 1630 07/28/17 0500  AST 47*  --  56*  --  75*  --   --   --  81*  --  56*  ALT 13*  --  24  --  29  --   --   --  28  --  27  ALKPHOS 78  --  32*  --  43  --   --   --  55  --  62  BILITOT 1.8*  --  0.8  --  0.8  --   --   --  1.1  --  1.4*  PROT 7.2  --  4.2*  --  4.7*  --   --   --  4.4*  --  4.7*  ALBUMIN 3.3*  --  1.6*  1.7*   < > 1.8*   < > 1.6* 1.5* 1.5*  1.5* 1.7* 1.5*  1.5*  INR 1.78 1.77  --   --   --   --   --   --   --   --   --    < > = values in this interval not displayed.     INFECTIOUS Recent Labs  Lab 07/23/17 1644  07/24/17 0539 07/24/17 1232 07/24/17 1721 07/25/17 0355  07/28/17 0500  LATICACIDVEN 3.3*   < >  --  3.3* 3.5*  --  1.7  PROCALCITON 124.45  --  >150.00  --   --  >150.00  --    < > = values in this interval not displayed.     ENDOCRINE CBG (last 3)  Recent Labs    07/27/17 2304 07/28/17 0532 07/28/17 0556  GLUCAP 119* 29* 102*         IMAGING x48h  - image(s) personally visualized  -   highlighted in bold Dg Chest Port 1 View  Result Date: 07/28/2017 CLINICAL DATA:  Ventilator support EXAM: PORTABLE CHEST 1 VIEW COMPARISON:  07/27/2017 FINDINGS: Endotracheal tube tip is 5 cm above the carina. Nasogastric tube enters the abdomen. Left subclavian central line and right internal jugular central line are unchanged. Persistent bilateral effusions with lower lobe volume loss. No change since yesterday. IMPRESSION: No change. Lines and tubes satisfactory. Persistent effusions and lower lobe volume loss. Electronically Signed   By: Paulina FusiMark  Shogry M.D.   On: 07/28/2017 06:27   Dg Chest Port 1 View  Result Date: 07/27/2017 CLINICAL DATA:  Followup ventilator support. EXAM: PORTABLE CHEST 1 VIEW COMPARISON:  07/25/2017 FINDINGS: Endotracheal tube tip is 4 cm above the carina. Nasogastric tube enters the abdomen. Left subclavian and right internal jugular central lines are unchanged with tips in the SVC above the right atrium. Slightly less pulmonary edema. Persistent effusions and lower lobe collapse. IMPRESSION: Slightly less pulmonary edema. Persistent effusions and lower lobe collapse. Electronically Signed   By: Paulina FusiMark  Shogry M.D.   On: 07/27/2017 07:44       ASSESSMENT / PLAN:  #GI Perforated viscus involving the distal small bowel s/p exploratory laparotomy with small bowel resection and placement of abdominal wound VAC; no anastomosis made peritonitis w/ E. coli bacteremia -felt d/t diverticular disease; back to the OR 12/6 underwent reexploration and creation of an ileostomy and closure of abdominal wall - On 07/28/2017 - continues on  TNA   Plan Continue n.p.o. status Routine wound care per surgery Day #6 anidulafludin and zosyn; awaiting final sensitivities  #CVS Septic shock   07/28/2017 -> off pressor  X 24h plan Map goal greater than 65  #RESP Ventilator dependence status post abdominal surgery - acute resp failure  - 07/28/2017 - >07/28/2017 - > does NOT meet criteria for SBT/Extubation in setting of Acute  Respiratory Failure due to sedation and renal failure  Plan Continue ventilator support for now; PAD protocol; RASS -1 VAP prevention SBT as tolerated   #RENAL Acute kidney injury with progressive NAG metabolic acidosis & hyperkalemia  -> Now on CRRT, appreciate nephrology assistance  07/28/2017 - making urine at 30cc/h. Issues with Rt IJ clotting + 6L positive. OFf pressors  Plan Avoid nephrotoxins DC CRRT - d/w DR Lenise Herald   #NEURO H/o ETOH abuse @ risk for w/d and acute delirium  07/28/2017 - deeply sedated. AT risk for agitated encephalopathy  Plan WUA to start Continue thiamine and folate Fentanyl andversed infusion Change RASS goal to -1    HEME #sepsis related thrombocytopenia - rule out HITT 07/27/17 #Anemia - critical illness and some blood loss via CRRT  -No evidence of bleeding  - platelets stabilized (off heparing since 07/27/17)   Plan Await HITT panel 07/27/17; consider Duplex US if needed SCD - PRBC for hgb </= 6.9gm%    - exceptions are   -  if ACS susepcted/confirmed then transfuse for hgb </= 8.0gm%,  or    -  active bleeding with hemodynamic instability, then transfuse regardless of hemoglobin value   At at all times try to transfuse 1 unit prbc as possible with exception of active hemorrhage      DVT prophylaxis: SCD SUP: PPI bid  Diet: NPO + TNA Activity: Bedrest Disposition :ICU Family: none at bedside all week      The patient is critically ill with multiple organ systems failure and requires high complexity decision making for assessment and  support, frequent evaluation and titration of therapies, application of advanced monitoring technologies and extensive interpretation of multiple databases.   Critical Care Time devoted to patient care services described in this note is  30  Minutes. This time reflects time of care of this signee Dr Kalman Shan. This critical care time does not reflect procedure time, or teaching time or supervisory time of PA/NP/Med student/Med Resident etc but could involve care discussion time    Dr. Kalman Shan, M.D., [facility].C.P Pulmonary and Critical Care Medicine Staff Physician [facility] Pager: [phone], If no answer or between  15:00h - 7:00h: call [phone]  07/28/2017 8:40 AM

## 2017-07-28 NOTE — Progress Notes (Signed)
Patient in Afib. Dr Marchelle Gearingamaswamy notified and Cardizem gtt ordered.
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
## 2017-07-28 NOTE — Progress Notes (Signed)
After Cathflo complete, CRRT resumed.  Seems to be ok with some adjusting.

## 2017-07-28 NOTE — Progress Notes (Signed)
Pharmacy Antibiotic Note  Caleb LaundryFrederick Singh is a 61 y.o. male admitted on 07/23/2017 with sepsis, IAI, bowel perforation.  Pharmacy has been consulted for Zosyn dosing.  S/p OR on 12/4 and 12/7.  BCID results show Ecoli bacteremia with final cultures and sensitivities pending.    Tm 99.3 WBC 11.9 (solumedrol 12/5-12/6, decadron periop 12/6) SCr increased overnight to 2.85, remains on CRRT (keep even d/t hypotension) started on 12/5.  Plan:  Adjust Zosyn to Zosyn 3.375gm IV q8h (4hr extended infusions) now that CRRT off  Continue Eraxis 100 mg IV q24h per MD  Recommend narrowing Zosyn to Unasyn now that [facility].  Height: 5\' 10"  (177.8 cm) Weight: 202 lb 6.1 oz (91.8 kg) IBW/kg (Calculated) : 73  Temp (24hrs), Avg:98.4 F (36.9 C), Min:97.6 F (36.4 C), Max:99.2 F (37.3 C)  Recent Labs  Lab 07/23/17 2058 07/24/17 0300  07/24/17 1232  07/24/17 1721  07/25/17 0355  07/26/17 0430 07/26/17 1608 07/27/17 0500 07/27/17 1056 07/27/17 1630 07/27/17 1840 07/28/17 0500  WBC  --   --    < >  --   --   --   --  11.9*  --   --   --  22.4* 20.3*  --  21.6* 19.0*  CREATININE  --   --    < >  --    < >  --    < > 3.02*   < > 2.85* 2.69* 2.68*  2.71*  --  2.19*  --  2.38*  2.43*  2.44*  LATICACIDVEN 3.5* 4.1*  --  3.3*  --  3.5*  --   --   --   --   --   --   --   --   --  1.7   < > = values in this interval not displayed.    Estimated Creatinine Clearance: 35.3 mL/min (A) (by C-G formula based on SCr of 2.44 mg/dL (H)).    No Known Allergies  Antimicrobials this admission: 12/4 Vancomycin >> 12/5 12/4 Zosyn >>  12/4 Eraxis >>   Dose adjustments this admission: 12/7 Zosyn to 3.375g IV q6h with the start of CRRT 12/9 Adjust Zosyn back to Zosyn 3.375gm IV q8h (4hr extended infusions)   Microbiology results: 12/4 BCx: 1of 3 bottles GNR (BCID = E.coli, no resistance) PANSENSITIVE  Thank you for allowing pharmacy to be a part of this patient's  care.  Loralee PacasErin Addley Ballinger, PharmD, BCPS Pager: [phone] 07/28/2017 10:48 AM

## 2017-07-29 ENCOUNTER — Inpatient Hospital Stay (HOSPITAL_COMMUNITY): Payer: Non-veteran care

## 2017-07-29 LAB — CBC WITH DIFFERENTIAL/PLATELET
BAND NEUTROPHILS: 5 %
BASOS ABS: 0 10*3/uL (ref 0.0–0.1)
Basophils Relative: 0 %
Eosinophils Absolute: 0.2 10*3/uL (ref 0.0–0.7)
Eosinophils Relative: 1 %
HEMATOCRIT: 22.1 % — AB (ref 39.0–52.0)
HEMOGLOBIN: 7.7 g/dL — AB (ref 13.0–17.0)
LYMPHS ABS: 1.2 10*3/uL (ref 0.7–4.0)
Lymphocytes Relative: 5 %
MCH: 30.3 pg (ref 26.0–34.0)
MCHC: 34.8 g/dL (ref 30.0–36.0)
MCV: 87 fL (ref 78.0–100.0)
MONOS PCT: 4 %
MYELOCYTES: 1 %
Monocytes Absolute: 1 10*3/uL (ref 0.1–1.0)
NEUTROS ABS: 21.5 10*3/uL — AB (ref 1.7–7.7)
Neutrophils Relative %: 83 %
OTHER: 1 %
Platelets: 133 10*3/uL — ABNORMAL LOW (ref 150–400)
RBC: 2.54 MIL/uL — AB (ref 4.22–5.81)
RDW: 15.8 % — ABNORMAL HIGH (ref 11.5–15.5)
WBC: 24.2 10*3/uL — AB (ref 4.0–10.5)

## 2017-07-29 LAB — GLUCOSE, CAPILLARY
GLUCOSE-CAPILLARY: 138 mg/dL — AB (ref 65–99)
GLUCOSE-CAPILLARY: 105 mg/dL — AB (ref 65–99)
GLUCOSE-CAPILLARY: 147 mg/dL — AB (ref 65–99)
Glucose-Capillary: 132 mg/dL — ABNORMAL HIGH (ref 65–99)
Glucose-Capillary: 121 mg/dL — ABNORMAL HIGH (ref 65–99)
Glucose-Capillary: 128 mg/dL — ABNORMAL HIGH (ref 65–99)

## 2017-07-29 LAB — BLOOD GAS, ARTERIAL
ACID-BASE EXCESS: 0.2 mmol/L (ref 0.0–2.0)
BICARBONATE: 23.9 mmol/L (ref 20.0–28.0)
Drawn by: 422461
FIO2: 40
LHR: 22 {breaths}/min
O2 Saturation: 98.3 %
PEEP/CPAP: 5 cmH2O
Patient temperature: 98.1
VT: 580 mL
pCO2 arterial: 36.2 mmHg (ref 32.0–48.0)
pH, Arterial: 7.434 (ref 7.350–7.450)
pO2, Arterial: 111 mmHg — ABNORMAL HIGH (ref 83.0–108.0)

## 2017-07-29 LAB — COMPREHENSIVE METABOLIC PANEL
ALT: 24 U/L (ref 17–63)
ANION GAP: 11 (ref 5–15)
AST: 44 U/L — AB (ref 15–41)
Albumin: 1.7 g/dL — ABNORMAL LOW (ref 3.5–5.0)
Alkaline Phosphatase: 74 U/L (ref 38–126)
BILIRUBIN TOTAL: 1.9 mg/dL — AB (ref 0.3–1.2)
BUN: 68 mg/dL — ABNORMAL HIGH (ref 6–20)
CHLORIDE: 105 mmol/L (ref 101–111)
CO2: 24 mmol/L (ref 22–32)
Calcium: 7.2 mg/dL — ABNORMAL LOW (ref 8.9–10.3)
Creatinine, Ser: 2.98 mg/dL — ABNORMAL HIGH (ref 0.61–1.24)
GFR, EST AFRICAN AMERICAN: 24 mL/min — AB (ref >60)
GFR, EST NON AFRICAN AMERICAN: 21 mL/min — AB (ref >60)
Glucose, Bld: 141 mg/dL — ABNORMAL HIGH (ref 65–99)
POTASSIUM: 4.1 mmol/L (ref 3.5–5.1)
Sodium: 140 mmol/L (ref 135–145)
TOTAL PROTEIN: 5.4 g/dL — AB (ref 6.5–8.1)

## 2017-07-29 LAB — TRIGLYCERIDES
TRIGLYCERIDES: 322 mg/dL — AB (ref <150)
Triglycerides: 328 mg/dL — ABNORMAL HIGH (ref <150)

## 2017-07-29 LAB — PREPARE RBC (CROSSMATCH)

## 2017-07-29 LAB — PHOSPHORUS: PHOSPHORUS: 4.4 mg/dL (ref 2.5–4.6)

## 2017-07-29 LAB — PREALBUMIN: Prealbumin: 9 mg/dL — ABNORMAL LOW (ref 18–38)

## 2017-07-29 LAB — MAGNESIUM: MAGNESIUM: 2.4 mg/dL (ref 1.7–2.4)

## 2017-07-29 MED ORDER — DEXMEDETOMIDINE HCL IN NACL 200 MCG/50ML IV SOLN
0.0000 ug/kg/h | INTRAVENOUS | Status: DC
  Administered 2017-07-29: 0.4 ug/kg/h via INTRAVENOUS
  Administered 2017-07-29: 0.8 ug/kg/h via INTRAVENOUS
  Administered 2017-07-29 (×2): 0.4 ug/kg/h via INTRAVENOUS
  Administered 2017-07-30: 0.6 ug/kg/h via INTRAVENOUS
  Administered 2017-07-30 (×3): 0.7 ug/kg/h via INTRAVENOUS
  Administered 2017-07-30: 0.6 ug/kg/h via INTRAVENOUS
  Administered 2017-07-31: 1.2 ug/kg/h via INTRAVENOUS
  Administered 2017-07-31: 1 ug/kg/h via INTRAVENOUS
  Filled 2017-07-29 (×12): qty 50

## 2017-07-29 MED ORDER — TRACE MINERALS CR-CU-MN-SE-ZN 10-1000-500-60 MCG/ML IV SOLN
INTRAVENOUS | Status: AC
  Administered 2017-07-29: 17:00:00 via INTRAVENOUS
  Filled 2017-07-29: qty 2000
  Filled 2017-07-29: qty 2400

## 2017-07-29 MED ORDER — FENTANYL CITRATE (PF) 100 MCG/2ML IJ SOLN
100.0000 ug | INTRAMUSCULAR | Status: AC | PRN
  Administered 2017-07-29 – 2017-07-30 (×3): 100 ug via INTRAVENOUS
  Filled 2017-07-29 (×3): qty 2

## 2017-07-29 MED ORDER — FENTANYL CITRATE (PF) 100 MCG/2ML IJ SOLN
INTRAMUSCULAR | Status: AC
  Filled 2017-07-29: qty 2

## 2017-07-29 MED ORDER — FENTANYL CITRATE (PF) 100 MCG/2ML IJ SOLN
100.0000 ug | INTRAMUSCULAR | Status: DC | PRN
  Administered 2017-07-29 – 2017-08-01 (×15): 100 ug via INTRAVENOUS
  Filled 2017-07-29 (×15): qty 2

## 2017-07-29 MED ORDER — FUROSEMIDE 10 MG/ML IJ SOLN
40.0000 mg | Freq: Two times a day (BID) | INTRAMUSCULAR | Status: DC
  Administered 2017-07-29 – 2017-07-30 (×2): 40 mg via INTRAVENOUS
  Filled 2017-07-29 (×2): qty 4

## 2017-07-29 MED ORDER — PIPERACILLIN-TAZOBACTAM 3.375 G IVPB
3.3750 g | Freq: Three times a day (TID) | INTRAVENOUS | Status: DC
  Administered 2017-07-29 – 2017-08-02 (×13): 3.375 g via INTRAVENOUS
  Filled 2017-07-29 (×12): qty 50

## 2017-07-29 MED ORDER — SODIUM CHLORIDE 0.9 % IV SOLN
Freq: Once | INTRAVENOUS | Status: AC
  Administered 2017-07-29: 21:00:00 via INTRAVENOUS

## 2017-07-29 MED ORDER — FUROSEMIDE 10 MG/ML IJ SOLN: 80.0000 mg | Freq: Two times a day (BID) | INTRAMUSCULAR | Status: DC

## 2017-07-29 NOTE — Progress Notes (Signed)
eLink Physician-Brief Progress Note Patient Name: Caleb LaundryFrederick Vercher DOB: 1956/02/02 MRN: [id number]   Date of Service  07/29/2017  HPI/Events of Note  Anemia - Hgb = 6.8.   eICU Interventions  Will transfuse 1 unit PRBC.     Intervention Category Major Interventions: Other:  Sommer,Steven Dennard Nipugene 07/29/2017, 1:28 AM

## 2017-07-29 NOTE — Progress Notes (Signed)
4 Days Post-Op    CC:  Abdominal pain   Subjective: Episode of asystole around 3:40 AM, Cardizem stopped.  Sedated on the Vent, he is in SR right now, has been in AF.  Open wound OK CRRT was discontinued on 12/8 per nursing   Objective: Vital signs in last 24 hours: Temp:  [98.1 F (36.7 C)-102.1 F (38.9 C)] 99.3 F (37.4 C) (12/10 0416) Pulse Rate:  [39-132] 81 (12/10 0600) Resp:  [22-33] 22 (12/10 0600) BP: (111-144)/(41-101) 130/60 (12/10 0600) SpO2:  [100 %] 100 % (12/10 0600) Arterial Line BP: (96-245)/(60-102) 141/66 (12/09 2100) FiO2 (%):  [40 %] 40 % (12/10 0416) Weight:  [87.8 kg (193 lb 9 oz)] 87.8 kg (193 lb 9 oz) (12/10 0500) Last BM Date: (green bile from ostomy) 2331 IV 3550 urine recorded NG 100 CRRT 979 Ileostomy 250 Fever yesterday TM 102.1 RR 22 BP OK Vent 100% FiO2 Off pressors Creatinine stable CMP stable CXR:  Stable lines, no change in LLL collapse/consolidation Intake/Output from previous day: 12/09 0701 - 12/10 0700 In: 2331 [I.V.:1781; Blood:380; NG/GT:20; IV Piggyback:150] Out: 9147 [Urine:3550; Emesis/NG output:100; Stool:250] Intake/Output this shift: No intake/output data recorded.  General appearance: sedated on the VEnt/ Resp: clear to auscultation bilaterally and anterior exam Cardio: currently in SR, rate 90's GI: No BS, open wound OK,  Extremities: marked edema upper extremities   Lab Results:  Recent Labs    07/28/17 1800 07/29/17 0457  WBC 20.3* 24.2*  HGB 6.8* 7.7*  HCT 20.5* 22.1*  PLT 97* 133*    BMET Recent Labs    07/28/17 0500 07/29/17 0457  NA 138  137 140  K 4.3  4.3 4.1  CL 104  103 105  CO2 _0 GLUCOSE 122*  126* 141*  BUN 52*  53* 68*  CREATININE 2.38*  2.43*  2.44* 2.98*  CALCIUM 6.9*  6.9* 7.2*   PT/INR No results for input(s): LABPROT, INR in the last 72 hours.  Recent Labs  Lab 07/24/17 1235  07/25/17 0355  07/26/17 1608 07/27/17 0500 07/27/17 1630 07/28/17 0500  07/29/17 0457  AST 56*  --  75*  --   --  81*  --  56* 44*  ALT 24  --  29  --   --  28  --  27 24  ALKPHOS 32*  --  43  --   --  55  --  62 74  BILITOT 0.8  --  0.8  --   --  1.1  --  1.4* 1.9*  PROT 4.2*  --  4.7*  --   --  4.4*  --  4.7* 5.4*  ALBUMIN 1.6*  1.7*   < > 1.8*   < > 1.5* 1.5*  1.5* 1.7* 1.5*  1.5* 1.7*   < > = values in this interval not displayed.     Lipase     Component Value Date/Time   LIPASE 47 07/23/2017 1455     Medications: . chlorhexidine gluconate (MEDLINE KIT)  15 mL Mouth Rinse BID  . Chlorhexidine Gluconate Cloth  6 each Topical Daily  . folic acid  1 mg Intravenous Daily  . furosemide  80 mg Intravenous Q8H  . insulin aspart  0-9 Units Subcutaneous Q6H  . mouth rinse  15 mL Mouth Rinse QID  . pantoprazole (PROTONIX) IV  40 mg Intravenous Q12H  . sodium chloride flush  10-40 mL Intracatheter Q12H  . thiamine injection  100 mg  Intravenous Daily   . sodium chloride    . anidulafungin Stopped (07/28/17 2354)  . anticoagulant sodium citrate    . diltiazem (CARDIZEM) infusion Stopped (07/29/17 0220)  . fentaNYL infusion INTRAVENOUS 100 mcg/hr (07/28/17 1754)  . midazolam (VERSED) infusion 3 mg/hr (07/28/17 1754)  . piperacillin-tazobactam (ZOSYN)  IV 3.375 g (07/29/17 0519)  . TPN (CLINIMIX) Adult without lytes 75 mL/hr at 07/28/17 1846   Anti-infectives (From admission, onward)   Start     Dose/Rate Route Frequency Ordered Stop   07/29/17 0600  piperacillin-tazobactam (ZOSYN) IVPB 3.375 g     3.375 g 12.5 mL/hr over 240 Minutes Intravenous Every 8 hours 07/29/17 0518     07/25/17 2000  vancomycin (VANCOCIN) IVPB 1000 mg/200 mL premix  Status:  Discontinued     1,000 mg 200 mL/hr over 60 Minutes Intravenous Every 48 hours 07/23/17 2042 07/24/17 0956   07/24/17 2200  piperacillin-tazobactam (ZOSYN) 3.375 g in dextrose 5 % 50 mL IVPB  Status:  Discontinued     3.375 g 100 mL/hr over 30 Minutes Intravenous Every 6 hours 07/24/17 2006 07/29/17  0518   07/24/17 2100  anidulafungin (ERAXIS) 100 mg in sodium chloride 0.9 % 100 mL IVPB     100 mg 78 mL/hr over 100 Minutes Intravenous Every 24 hours 07/23/17 2039     07/24/17 0000  anidulafungin (ERAXIS) 100 mg in sodium chloride 0.9 % 100 mL IVPB  Status:  Discontinued     100 mg 78 mL/hr over 100 Minutes Intravenous Every 24 hours 07/23/17 2038 07/23/17 2039   07/23/17 2200  piperacillin-tazobactam (ZOSYN) IVPB 3.375 g  Status:  Discontinued     3.375 g 12.5 mL/hr over 240 Minutes Intravenous Every 8 hours 07/23/17 2043 07/24/17 2006   07/23/17 2100  anidulafungin (ERAXIS) 200 mg in sodium chloride 0.9 % 200 mL IVPB     200 mg 78 mL/hr over 200 Minutes Intravenous  Once 07/23/17 2038 07/24/17 0239   07/23/17 2100  vancomycin (VANCOCIN) 1,500 mg in sodium chloride 0.9 % 500 mL IVPB     1,500 mg 250 mL/hr over 120 Minutes Intravenous  Once 07/23/17 2041 07/23/17 2317   07/23/17 1515  piperacillin-tazobactam (ZOSYN) IVPB 3.375 g     3.375 g 100 mL/hr over 30 Minutes Intravenous  Once 07/23/17 1511 07/23/17 1611     Assessment/Plan Perforated distal small boweland ischemia of cecum 1.  S/Pexploratory laparotomy, small bowel resection without anastomosis, placement of open wound VAC system 07/23/17 Dr. Randall Hiss Wilson/Dr. Fanny Skates 2.  S/p Reexploration of abdomen, ileocecectomy, creation of end ileostomy, closure of abdominal wall, 07/25/17, Dr. [name]  - Post op ileus   Sepsis/shock - vasopressors off AF - Asystole 07/29/17 - no Code Blue  - Cardizem currently off E coli Bacteremia - Zosyn Acute respiratory failure - Ventilator per CCM Acute renal failure-CRRT discontinued 07/27/17 Malnutrition - Prealbumin <5 Anemia - transfused 1 Unit PRBC 07/29/17 History of heavy alcohol use -sedated on ventilatior History of tobacco use FEN:  TPN/IV fluids ID:  Zosyn 07/23/17 =>> day 7, Vancomycin 07/23/17 - 07/24/17,  anidulafungin 07/23/17 =>> day 7 CHE:NIDP - Severe  anemia Follow-up:Dr. Greer Pickerel   Plan:  Continue Medical management.  Consider wound vac Wed.         LOS: 6 days    Amoura Ransier 07/29/2017 [phone]

## 2017-07-29 NOTE — Progress Notes (Signed)
[id number]/Rhonda Davis,BSN,RN3,CCM [phone]/Acutely ill, on full vent, support and a-line.

## 2017-07-29 NOTE — Progress Notes (Signed)
[id number] [address] [id number] [facility] >G>TEXTTAG>onsKent ayden RasmussenRomie [phone] SalmonsAce Gins [address]  [facility]64[address]den Rasmussen[address] StreetAlmond LintArvinMeritor [address]Hayden RasmussenRomie [phone]  At 1800 today:  Clinimix (NO ELYTES) 5/15 at 17700ml/hr  Monitor for fluid overload  HOLD lipids while ICU status. Start lipids 12/14 if appropriate per triglyceride level  Plan to advance as tolerated to the goal rate.  TPN to contain standard multivitamins and trace element daily.  IVF per MD.  Continue CBGs and sensitive SSI q6h.   TPN lab panels on Mondays & Thursdays.  Juliette Alcideustin Zeigler, PharmD, BCPS.   Pager: [phone] 07/29/2017 7:49 AM

## 2017-07-29 NOTE — Progress Notes (Signed)
80 cc fentanyl wasted in sink. Marijo SanesKara Brooks witness to waste.

## 2017-07-29 NOTE — Progress Notes (Signed)
[facility] Progress Note  Subjective: good UOP on lasix, creat up some, K ok   Vitals:   07/29/17 1140 07/29/17 1209 07/29/17 1210 07/29/17 1300  BP: (!) 131/56 (!) 126/52  (!) 117/48  Pulse: 94 96  84  Resp: (!) 22 (!) 22  (!) 22  Temp:   (!) 101.7 F (38.7 C)   TempSrc:   Oral   SpO2: 100% 100%  100%  Weight:      Height:        Inpatient medications: . chlorhexidine gluconate (MEDLINE KIT)  15 mL Mouth Rinse BID  . Chlorhexidine Gluconate Cloth  6 each Topical Daily  . folic acid  1 mg Intravenous Daily  . furosemide  80 mg Intravenous Q12H  . insulin aspart  0-9 Units Subcutaneous Q6H  . mouth rinse  15 mL Mouth Rinse QID  . pantoprazole (PROTONIX) IV  40 mg Intravenous Q12H  . sodium chloride flush  10-40 mL Intracatheter Q12H  . thiamine injection  100 mg Intravenous Daily   . sodium chloride    . anidulafungin Stopped (07/28/17 2354)  . dexmedetomidine (PRECEDEX) IV infusion 0.4 mcg/kg/hr (07/29/17 1206)  . piperacillin-tazobactam (ZOSYN)  IV Stopped (07/29/17 1610)  . TPN (CLINIMIX) Adult without lytes 75 mL/hr at 07/28/17 1846  . TPN (CLINIMIX) Adult without lytes     acetaminophen, fentaNYL (SUBLIMAZE) injection, fentaNYL (SUBLIMAZE) injection, sodium chloride flush  Exam: Intubated, on vent, sedated No jvd Chest coarse BS RRR  Abd distended, open abd wd w dressing, ileostomy in place Ext diffuse 2-3+ pitting edema Neuro on vent, sedated  CXR 12/10 > bilat LL effusions and vol loss  2.3 L in / 4.8 L out w IV lasix   Impression: 1.  Acute oliguric AKI: nonoliguric now, good UOP w/ 3.5 L on iv lasix yesterday. No need for RRT today, cont to follow, hopefully renal fxn will start to recover.  2.  Perforated viscus: s/p ileocecectomy 12/6.  + end-ileostomy.  Per CCS 3.  Shock: better, off pressors. Per primary. BP's good.  4.  Acute hypoxic RF: per PCCM. Remains on the vent, per cxr bilat effusions.  5.  Vol excess/ anasarca: lower lasix  dose to 40 bid IV, limit fluids as much as Mal Misty MD [facility] pager [phone]   07/29/2017, 2:45 PM   Recent Labs  Lab 07/27/17 1630 07/28/17 0500 07/29/17 0457  NA 138 138  137 140  K 4.5 4.3  4.3 4.1  CL 103 104  103 105  CO2 _0 GLUCOSE 105* 122*  126* 141*  BUN 46* 52*  53* 68*  CREATININE 2.19* 2.38*  2.43*  2.44* 2.98*  CALCIUM 6.8* 6.9*  6.9* 7.2*  PHOS 2.7 2.5 4.4   Recent Labs  Lab 07/27/17 0500 07/27/17 1630 07/28/17 0500 07/29/17 0457  AST 81*  --  56* 44*  ALT 28  --  27 24  ALKPHOS 55  --  62 74  BILITOT 1.1  --  1.4* 1.9*  PROT 4.4*  --  4.7* 5.4*  ALBUMIN 1.5*  1.5* 1.7* 1.5*  1.5* 1.7*   Recent Labs  Lab 07/28/17 0500 07/28/17 1800 07/29/17 0457  WBC 19.0* 20.3* 24.2*  NEUTROABS 16.2* 17.1* 21.5*  HGB 7.1* 6.8* 7.7*  HCT 19.6* 20.5* 22.1*  MCV 89.5 89.1 87.0  PLT 53* 97* 133*   Iron/TIBC/Ferritin/ %Sat No results found for: IRON, TIBC, FERRITIN, IRONPCTSAT

## 2017-07-29 NOTE — Progress Notes (Signed)
PULMONARY / CRITICAL CARE MEDICINE   Name: Caleb Singh MRN: [id number] DOB: [birth year]    ADMISSION DATE:  07/23/2017 CONSULTATION DATE: 07/23/2017   CHIEF COMPLAINT: Abdominal pain  Brief  This is an otherwise healthy 61 year old who began having severe abdominal pain on Sunday evening.  Onset was acute and the pain is remained localized to the midline anteriorly.  He has not had any pain radiating through to his back.  He has not had any bowel movement since Sunday morning.  He has had some nausea but no vomiting.  He has had no p.o. intake since that time.  Chest x-ray obtained in the emergency room shows free air.  He denies a history of prior abdominal surgeries.  Denies a history of weight loss or overt blood in the stool. Pertinent to his surgical risk, he has no known history of coronary artery disease, chest pain myocardial infarction, CHF, or arrhythmias.  He does have a very distant history of pulmonary embolism.  He gives no history consistent with chronic lung disease, he reports normal exercise tolerance and no difficulty with unusual dyspnea.  He has never had any significant exposure to anesthesia in the past his only surgical procedure was debridement of a finger on his right hand.   SUBJECTIVE/OVERNIGHT/INTERVAL HX Bradycardic last night, now back in sinus rhythm after being in A. fib with RVR then having a bradycardia/asystole event New fever VITAL SIGNS: BP 130/60   Pulse 81   Temp 99.3 F (37.4 C) (Axillary)   Resp (!) 22   Ht 5\' 10"  (1.778 m)   Wt 193 lb 9 oz (87.8 kg)   SpO2 100%   BMI 27.77 kg/m    HEMODYNAMICS:    VENTILATOR SETTINGS: Vent Mode: PRVC FiO2 (%):  [40 %] 40 % Set Rate:  [22 bmp] 22 bmp Vt Set:  [580 mL] 580 mL PEEP:  [5 cmH20] 5 cmH20 Plateau Pressure:  [22 cmH20-28 cmH20] 23 cmH20  INTAKE / OUTPUT:  Intake/Output Summary (Last 24 hours) at 07/29/2017 0817 Last data filed at 07/29/2017 0700 Gross per 24 hour  Intake 2274 ml   Output 4657 ml  Net -2383 ml     PHYSICAL EXAMINATION:  General: 61 year old African-American male currently sedated on ventilator. HEENT: Cephalic atraumatic.  He has a right IJ catheter unremarkable.  Mucous membranes are moist. Pulmonary: Decreased bases, equal chest rise on ventilator. Cardiac: Regular rate and rhythm without murmur rub or gallop. Abdomen: Soft, winces with palpation.  Mid abdominal dressing clean dry and intact.  Ostomy unremarkable. Extremities/musculoskeletal: Warm, dry, has dependent lower extremity edema, strong pulses. Neuro/psych: Heavily sedated this a.m.   PULMONARY Recent Labs  Lab 07/23/17 1504  07/23/17 1823 07/23/17 1914  07/24/17 1900 07/26/17 0900 07/26/17 1535 07/28/17 0856 07/29/17 0418  PHART  --    < > 7.202* 7.220*   < > 7.320* 7.441 7.398 7.453* 7.434  PCO2ART  --    < > 52.8* 41.4   < > 34.4 34.1 38.9 35.0 36.2  PO2ART  --    < > 323.0* 219.0*   < > 114* 104 94.2 114* 111*  HCO3  --    < > 20.5 16.8*   < > 17.2* 22.8 23.5 24.1 23.9  TCO2 19*  --  22 18*  --   --   --   --   --   --   O2SAT  --    < > 100.0 100.0   < > 97.7 97.8 96.4  98.2 98.3   < > = values in this interval not displayed.    CBC Recent Labs  Lab 07/28/17 0500 07/28/17 1800 07/29/17 0457  HGB 7.1* 6.8* 7.7*  HCT 19.6* 20.5* 22.1*  WBC 19.0* 20.3* 24.2*  PLT 53* 97* 133*    COAGULATION Recent Labs  Lab 07/23/17 1455 07/24/17 0350  INR 1.78 1.77    CARDIAC  No results for input(s): TROPONINI in the last 168 hours. No results for input(s): PROBNP in the last 168 hours.   CHEMISTRY Recent Labs  Lab 07/25/17 0355  07/26/17 0429  07/26/17 1608 07/27/17 0500 07/27/17 1630 07/28/17 0500 07/29/17 0457  NA 134*   < >  --    < > 136 136  134* 138 138  137 140  K 4.3   < >  --    < > 4.2 4.5  4.4 4.5 4.3  4.3 4.1  CL 99*   < >  --    < > 103 103  102 103 104  103 105  CO2 24   < >  --    < > 24 24  23 26 26  25 24   GLUCOSE 111*   < >   --    < > 115* 138*  136* 105* 122*  126* 141*  BUN 39*   < >  --    < > 45* 52*  52* 46* 52*  53* 68*  CREATININE 3.02*   < >  --    < > 2.69* 2.68*  2.71* 2.19* 2.38*  2.43*  2.44* 2.98*  CALCIUM 6.6*   < >  --    < > 6.3* 6.7*  6.5* 6.8* 6.9*  6.9* 7.2*  MG 1.8  --  2.3  --   --  2.5*  --  2.4 2.4  PHOS 4.4   < >  --    < > 4.0 2.9  2.9 2.7 2.5 4.4   < > = values in this interval not displayed.   Estimated Creatinine Clearance: 28.3 mL/min (A) (by C-G formula based on SCr of 2.98 mg/dL (H)).   LIVER Recent Labs  Lab 07/23/17 1455 07/24/17 0350 07/24/17 1235  07/25/17 0355  07/26/17 1608 07/27/17 0500 07/27/17 1630 07/28/17 0500 07/29/17 0457  AST 47*  --  56*  --  75*  --   --  81*  --  56* 44*  ALT 13*  --  24  --  29  --   --  28  --  27 24  ALKPHOS 78  --  32*  --  43  --   --  55  --  62 74  BILITOT 1.8*  --  0.8  --  0.8  --   --  1.1  --  1.4* 1.9*  PROT 7.2  --  4.2*  --  4.7*  --   --  4.4*  --  4.7* 5.4*  ALBUMIN 3.3*  --  1.6*  1.7*   < > 1.8*   < > 1.5* 1.5*  1.5* 1.7* 1.5*  1.5* 1.7*  INR 1.78 1.77  --   --   --   --   --   --   --   --   --    < > = values in this interval not displayed.     INFECTIOUS Recent Labs  Lab 07/23/17 1644  07/24/17 0539 07/24/17 1232 07/24/17 1721 07/25/17 0355 07/28/17 0500  LATICACIDVEN 3.3*   < >  --  3.3* 3.5*  --  1.7  PROCALCITON 124.45  --  >150.00  --   --  >150.00  --    < > = values in this interval not displayed.     ENDOCRINE CBG (last 3)  Recent Labs    07/28/17 1759 07/28/17 2335 07/29/17 0525  GLUCAP 114* 132* 138*      IMAGING x48h  - image(s) personally visualized  -   highlighted in bold Dg Chest Port 1 View  Result Date: 07/29/2017 CLINICAL DATA:  61 year old male postoperative day 5 status post repeat abdominal surgery and bowel resection for sepsis and bowel perforation. Intubated. EXAM: PORTABLE CHEST 1 VIEW COMPARISON:  07/28/2017 and earlier. FINDINGS: Portable AP semi  upright view at 0412 hours. Endotracheal tube tip in good position between the clavicles and carina. Enteric tube courses to the abdomen, side hole not included. Stable dual lumen right IJ central line and single lumen left subclavian approach central line. Mediastinal contours remain normal. Veiling and confluent bibasilar pulmonary opacity persists and is greater on the left. No pneumothorax or pulmonary edema. Stable other than perhaps mildly improved left perihilar ventilation since yesterday. IMPRESSION: 1.  Stable lines and tubes. 2. No significant change in bibasilar opacity with left lower lobe collapse or consolidation. 3. No new cardiopulmonary abnormality. Electronically Signed   By: Odessa FlemingH  Hall M.D.   On: 07/29/2017 07:26   Dg Chest Port 1 View  Result Date: 07/28/2017 CLINICAL DATA:  Ventilator support EXAM: PORTABLE CHEST 1 VIEW COMPARISON:  07/27/2017 FINDINGS: Endotracheal tube tip is 5 cm above the carina. Nasogastric tube enters the abdomen. Left subclavian central line and right internal jugular central line are unchanged. Persistent bilateral effusions with lower lobe volume loss. No change since yesterday. IMPRESSION: No change. Lines and tubes satisfactory. Persistent effusions and lower lobe volume loss. Electronically Signed   By: Paulina FusiMark  Shogry M.D.   On: 07/28/2017 06:27    ASSESSMENT / PLAN:  Perforated viscus involving the distal small bowel s/p exploratory laparotomy with small bowel resection and placement of abdominal wound VAC; no anastomosis made peritonitis w/ E. coli bacteremia (pansensitive); septic shock resolved -felt d/t diverticular disease; back to the OR 12/6 underwent reexploration and creation of an ileostomy and closure of abdominal wall - On 07/29/2017 - continues on TNA Plan Continuing n.p.o. status Wound care per surgery Day #7 of Zosyn anidulafludin; we will stop antifungal at day 8, continue Zosyn for now  New fever; white blood cell count climbing  some. Plan We will reculture today. May need to consider imaging of abdomen/pelvis soon if white cells continue to climb  A. fib with rapid ventricular response, complicated by symptomatic bradycardia/prolonged cardiac pause/asystole Suspect bradycardia exacerbated by calcium channel blocker suspect A. fib is exacerbated by weaning and anemia.  He is now back in normal sinus rhythm Plan We will remove Cardizem off MAR, this was stopped earlier today Continue telemetry monitoring  Ventilator dependence status post abdominal surgery - acute resp failure Portable chest x-ray personally reviewed: Endotracheal tube is in satisfactory position.He has right IJ catheter in satisfactory position.  There is bibasilar atelectasis.  There is no significant change when compared to prior film  Plan Continue ventilator support for now; PAD protocol; RASS -1 VAP prevention SBT as tolerated   Acute kidney injury with progressive NAG metabolic acidosis & hyperkalemia At risk for fluid and electrolyte imbalance -> Now making urine, CRRT stopped on 12/9 his  creatinine has elevated some. Plan Need to avoid hypovolemia at this point Strict intake and output may actually more aggressive fluid replacement and his renal function returns Repeat afternoon labs and chemistry in a.m. Continue to renal dose medications Decrease lasix to q 12  Acute encephalopathy; suspect this is a mix of metabolic encephalopathy  h/o ETOH abuse  Plan We will stop Versed and fentanyl infusion Start dipper Zenaida NieceVan with as needed fentanyl; if additional sedation needed would start Precedex being mindful of prior bradycardic event he will need to be watched closely RASS goal 0--1   sepsis related thrombocytopenia - rule out HITT 07/27/17 Anemia - critical illness and some blood loss via CRRT -Received blood on 12/9 with appropriate hemoglobin bump - platelets increasing (off heparin since 07/27/17)  Plan Awaiting hit panel May  consider resuming subcu heparin soon particularly if hit negative, to new SCDs for now  Protein calorie malnutrition Plan Continue TPN   DVT prophylaxis: SCD SUP: PPI bid  Diet: NPO + TNA Activity: Bedrest Disposition :ICU Family: none at bedside all week   Discussion: Seems stable from a surgical standpoint however he did have new fever spike as well as some rise in white blood cell count.  I am always concerned about abdominal abscess in these cases.  He is hemodynamically stable, seems to be doing okay off from CRRT.  I suspect the atrial fibrillation yesterday was the perfect combination of fever, anemia, and attempting to wean.  Most likely calcium channel blockade responsible for the bradycardic event he is back in sinus rhythm.  For today we will stop sedating drips, continue supportive care, reculture blood, and evaluate for weaning.  If his fever continues to spike and or develops worsening leukocytosis we should consider CT imaging of the abdomen pelvis. My critical care time 36 minutes.  Simonne MartinetPeter E Dillyn Joaquin ACNP-BC [facility]Critical [facility] Pager # [phone] OR # [phone] if no answer  07/29/2017 8:17 AM

## 2017-07-29 NOTE — Progress Notes (Signed)
eLink Physician-Brief Progress Note Patient Name: Caleb LaundryFrederick Singh DOB: 17-Mar-1956 MRN: [id number]   Date of Service  07/29/2017  HPI/Events of Note  Rhythm dilemma - Having episodes of AFIB with RVR off of Cardizem IV infusion and episodes of asystole on Cardizem IV infusion.    eICU Interventions  Will try to hold Cardizem IV infusion. If no asystole, try to restart Cardizem IV infusion at 2.5 mg/hour. This is a difficult issue.      Intervention Category Major Interventions: Arrhythmia - evaluation and management  Sommer,[name] 07/29/2017, 3:46 AM

## 2017-07-30 ENCOUNTER — Inpatient Hospital Stay (HOSPITAL_COMMUNITY): Payer: Non-veteran care

## 2017-07-30 LAB — TYPE AND SCREEN
ABO/RH(D): O POS
ANTIBODY SCREEN: NEGATIVE
UNIT DIVISION: 0
Unit division: 0

## 2017-07-30 LAB — BLOOD GAS, ARTERIAL
ACID-BASE DEFICIT: 0.2 mmol/L (ref 0.0–2.0)
Bicarbonate: 23.2 mmol/L (ref 20.0–28.0)
DRAWN BY: 11249
FIO2: 40
O2 SAT: 96.3 %
PATIENT TEMPERATURE: 98.7
PEEP/CPAP: 5 cmH2O
PH ART: 7.444 (ref 7.350–7.450)
RATE: 22 resp/min
VT: 580 mL
pCO2 arterial: 34.4 mmHg (ref 32.0–48.0)
pO2, Arterial: 82.7 mmHg — ABNORMAL LOW (ref 83.0–108.0)

## 2017-07-30 LAB — CBC WITH DIFFERENTIAL/PLATELET
BASOS PCT: 1 %
Basophils Absolute: 0.2 10*3/uL — ABNORMAL HIGH (ref 0.0–0.1)
EOS ABS: 0.2 10*3/uL (ref 0.0–0.7)
EOS PCT: 1 %
HCT: 18.9 % — ABNORMAL LOW (ref 39.0–52.0)
HEMOGLOBIN: 6.7 g/dL — AB (ref 13.0–17.0)
LYMPHS PCT: 11 %
Lymphs Abs: 2.7 10*3/uL (ref 0.7–4.0)
MCH: 30.5 pg (ref 26.0–34.0)
MCHC: 35.4 g/dL (ref 30.0–36.0)
MCV: 85.9 fL (ref 78.0–100.0)
MONOS PCT: 6 %
Monocytes Absolute: 1.5 10*3/uL — ABNORMAL HIGH (ref 0.1–1.0)
Neutro Abs: 19 10*3/uL — ABNORMAL HIGH (ref 1.7–7.7)
Neutrophils Relative %: 78 %
OTHER: 3 %
PLATELETS: 235 10*3/uL (ref 150–400)
RBC: 2.2 MIL/uL — ABNORMAL LOW (ref 4.22–5.81)
RDW: 15.9 % — ABNORMAL HIGH (ref 11.5–15.5)
WBC: 24.3 10*3/uL — ABNORMAL HIGH (ref 4.0–10.5)

## 2017-07-30 LAB — HEMOGLOBIN AND HEMATOCRIT, BLOOD
HEMATOCRIT: 25.1 % — AB (ref 39.0–52.0)
HEMOGLOBIN: 8.7 g/dL — AB (ref 13.0–17.0)

## 2017-07-30 LAB — BASIC METABOLIC PANEL
Anion gap: 10 (ref 5–15)
BUN: 91 mg/dL — AB (ref 6–20)
CHLORIDE: 104 mmol/L (ref 101–111)
CO2: 25 mmol/L (ref 22–32)
CREATININE: 3.7 mg/dL — AB (ref 0.61–1.24)
Calcium: 7.3 mg/dL — ABNORMAL LOW (ref 8.9–10.3)
GFR calc Af Amer: 19 mL/min — ABNORMAL LOW (ref >60)
GFR calc non Af Amer: 16 mL/min — ABNORMAL LOW (ref >60)
Glucose, Bld: 124 mg/dL — ABNORMAL HIGH (ref 65–99)
Potassium: 3.5 mmol/L (ref 3.5–5.1)
SODIUM: 139 mmol/L (ref 135–145)

## 2017-07-30 LAB — BPAM RBC
BLOOD PRODUCT EXPIRATION DATE: 201812212359
BLOOD PRODUCT EXPIRATION DATE: 201812252359
ISSUE DATE / TIME: 201812100222
ISSUE DATE / TIME: 201812081221
UNIT TYPE AND RH: 5100
Unit Type and Rh: 5100

## 2017-07-30 LAB — URINALYSIS, ROUTINE W REFLEX MICROSCOPIC
BILIRUBIN URINE: NEGATIVE
Glucose, UA: NEGATIVE mg/dL
KETONES UR: NEGATIVE mg/dL
LEUKOCYTES UA: NEGATIVE
Nitrite: NEGATIVE
PH: 5 (ref 5.0–8.0)
PROTEIN: NEGATIVE mg/dL
Specific Gravity, Urine: 1.012 (ref 1.005–1.030)

## 2017-07-30 LAB — GLUCOSE, CAPILLARY
GLUCOSE-CAPILLARY: 130 mg/dL — AB (ref 65–99)
Glucose-Capillary: 114 mg/dL — ABNORMAL HIGH (ref 65–99)
Glucose-Capillary: 130 mg/dL — ABNORMAL HIGH (ref 65–99)
Glucose-Capillary: 121 mg/dL — ABNORMAL HIGH (ref 65–99)

## 2017-07-30 LAB — PREPARE RBC (CROSSMATCH)

## 2017-07-30 LAB — MAGNESIUM: Magnesium: 2.3 mg/dL (ref 1.7–2.4)

## 2017-07-30 LAB — HEPARIN INDUCED PLATELET AB (HIT ANTIBODY): Heparin Induced Plt Ab: 0.181 OD (ref 0.000–0.400)

## 2017-07-30 LAB — PHOSPHORUS: Phosphorus: 3.9 mg/dL (ref 2.5–4.6)

## 2017-07-30 MED ORDER — IOPAMIDOL (ISOVUE-300) INJECTION 61%
INTRAVENOUS | Status: AC
  Filled 2017-07-30: qty 30

## 2017-07-30 MED ORDER — TRACE MINERALS CR-CU-MN-SE-ZN 10-1000-500-60 MCG/ML IV SOLN
INTRAVENOUS | Status: AC
  Administered 2017-07-30: 17:00:00 via INTRAVENOUS
  Filled 2017-07-30 (×2): qty 2400

## 2017-07-30 MED ORDER — SODIUM CHLORIDE 0.9 % IV SOLN
Freq: Once | INTRAVENOUS | Status: AC
  Administered 2017-07-30: 06:00:00 via INTRAVENOUS

## 2017-07-30 MED ORDER — IOPAMIDOL (ISOVUE-300) INJECTION 61%
15.0000 mL | Freq: Two times a day (BID) | INTRAVENOUS | Status: DC | PRN
  Administered 2017-08-22: 15 mL via ORAL
  Filled 2017-07-30: qty 30

## 2017-07-30 NOTE — Progress Notes (Signed)
PHARMACY - ADULT TOTAL PARENTERAL NUTRITION CONSULT NOTE   Pharmacy Consult for TPN Indication: prolonged ileus  Patient Measurements: Body mass index is 27.96 kg/m. Filed Weights   07/28/17 0402 07/29/17 0500 07/30/17 0307  Weight: 202 lb 6.1 oz (91.8 kg) 193 lb 9 oz (87.8 kg) 194 lb 14.2 oz (88.4 kg)    HPI: [name] admitted on 12/4 with c/o severe and worsening abdominal distention, pain, nonbloody, nonbilious, non-coffee-ground emesis and lack of bowel movements x3 days.  No significant PMH.  He was found to have perforated small bowel s/p repair in OR, and remains intubated/sedated on pressors postop.  Pharmacy is consulted to dose TPN.  Significant events:  12/4 SB resection, open wound VAC.  NG tube placed 12/5 Started CRRT 12/6 Repeat OR on for washout, ileocecectomy, creation of end ileostomy,closure of abdominal wall 12/9 CRRT off 12/9 am  Insulin requirements past 24 hours: 2 units SSI/24h  Current Nutrition: NPO  IVF: NS KVO  Central access: CVC triple lumen placed 12/4 TPN start date: 12/7  ASSESSMENT                                                                                                          Today:   Glucose:  At goal < 150 (no hx of DM)  Electrolytes:  Na, K, Mg, Phos all WNL. CorrCa wnl at 9.04.  Renal:  SCr up to 2.98 (off CRRT 12/9a). good UOP on lasix 80mg  IV q12h  I/O = - 3250mL/24h, NGT = 200mL  LFTs:  AST <2x ULN; ALT & AlkPhos wnl, Tbili increasing  TGs:  Elevated, now off propofol (no fat emulsion with parenteral nutrition for 1st week since ICU status): 233 (12/5), 400 (12/6), 281 (12/7), 322 (12/10)  Prealbumin:  9.3 (12/5), 9 (12/10)   NUTRITIONAL GOALS                                                                                             RD recs (12/11): 119g Protein, 2211 Kcal  Based on published guidelines, while the patient meets ICU status the initiation of lipids is being delayed for 7 days or until transition out of  ICU. Planned start date for lipids is 12/14.  Goal will be to meet 100% of the patient's protein needs and approximately 80% of their caloric needs.  Clinimix 5/20 @ 100 mL/hr to provide  120grams protein (100% of goal) and 2112 kcal (95% of goal)  Glucose infusion rate will be 3.77 mg/kg/min (Maximum 5 mg/kg/min)   PLAN  At 1800 today:  Adjust to Clinimix (NO ELYTES) 5/20 at 14800ml/hr for inc Kcal needs  Monitor for fluid overload - PCCM adjusting diuretics  Watch CBGs with change to product with more Dextrose  HOLD lipids while ICU status. Start lipids 12/14 if appropriate per triglyceride level  Will need to adjust Clinimix once lipids start  Plan to advance as tolerated to the goal rate.  TPN to contain standard multivitamins and trace element daily.  IVF per MD.  Continue CBGs and sensitive SSI q6h.   TPN lab panels on Mondays & Thursdays.  BMP in am  Caleb Singh, PharmD, BCPS.   Pager: [phone] 07/30/2017 7:31 AM

## 2017-07-30 NOTE — Progress Notes (Signed)
PULMONARY / CRITICAL CARE MEDICINE   Name: Caleb Singh MRN: [id number] DOB: Mar 14, 1956    ADMISSION DATE:  07/23/2017 CONSULTATION DATE: 07/23/2017   CHIEF COMPLAINT: Abdominal pain  Brief  This is an otherwise healthy 61 year old who began having severe abdominal pain on Sunday evening.  Onset was acute and the pain is remained localized to the midline anteriorly.  He has not had any pain radiating through to his back.  He has not had any bowel movement since Sunday morning.  He has had some nausea but no vomiting.  He has had no p.o. intake since that time.  Chest x-ray obtained in the emergency room shows free air.  He denies a history of prior abdominal surgeries.  Denies a history of weight loss or overt blood in the stool. Pertinent to his surgical risk, he has no known history of coronary artery disease, chest pain myocardial infarction, CHF, or arrhythmias.  He does have a very distant history of pulmonary embolism.  He gives no history consistent with chronic lung disease, he reports normal exercise tolerance and no difficulty with unusual dyspnea.  He has never had any significant exposure to anesthesia in the past his only surgical procedure was debridement of a finger on his right hand.   SUBJECTIVE/OVERNIGHT/INTERVAL HX Still febrile with significant leukocytosis VITAL SIGNS: BP (!) 154/87   Pulse 83   Temp (!) 101 F (38.3 C) (Axillary)   Resp (!) 23   Ht 5\' 10"  (1.778 m)   Wt 194 lb 14.2 oz (88.4 kg)   SpO2 100%   BMI 27.96 kg/m   HEMODYNAMICS:    VENTILATOR SETTINGS: Vent Mode: CPAP;PSV FiO2 (%):  [40 %] 40 % Set Rate:  [22 bmp] 22 bmp Vt Set:  [580 mL] 580 mL PEEP:  [5 cmH20] 5 cmH20 Pressure Support:  [15 cmH20] 15 cmH20 Plateau Pressure:  [21 cmH20-23 cmH20] 21 cmH20  INTAKE / OUTPUT:  Intake/Output Summary (Last 24 hours) at 07/30/2017 0836 Last data filed at 07/30/2017 0800 Gross per 24 hour  Intake 2808.2 ml  Output 3255 ml  Net -446.8 ml      PHYSICAL EXAMINATION: General: This is a 61 year old male patient currently sedated on the ventilator. HEENT: Normocephalic atraumatic orally intubated right IJ catheter unremarkable.  Mucous membranes are moist. Pulmonary: Decreased both bases, some accessory muscle use on higher levels of pressure support Cardiac: Regular rate and rhythm without murmur rub or gallop. Abdomen: Abdomen soft, dressing intact, ostomy unremarkable. GU: Concentrated yellow urine Extremities/musculoskeletal: Warm, dry, lower extremity edema noted.  Strong pulses. Neuro/psych: Opens eyes to stimulus, agitated at times and then easily oversedated   PULMONARY Recent Labs  Lab 07/23/17 1504  07/23/17 1823 07/23/17 1914  07/26/17 0900 07/26/17 1535 07/28/17 0856 07/29/17 0418 07/30/17 0500  PHART  --    < > 7.202* 7.220*   < > 7.441 7.398 7.453* 7.434 7.444  PCO2ART  --    < > 52.8* 41.4   < > 34.1 38.9 35.0 36.2 34.4  PO2ART  --    < > 323.0* 219.0*   < > 104 94.2 114* 111* 82.7*  HCO3  --    < > 20.5 16.8*   < > 22.8 23.5 24.1 23.9 23.2  TCO2 19*  --  22 18*  --   --   --   --   --   --   O2SAT  --    < > 100.0 100.0   < > 97.8 96.4 98.2  98.3 96.3   < > = values in this interval not displayed.    CBC Recent Labs  Lab 07/28/17 1800 07/29/17 0457 07/30/17 0425  HGB 6.8* 7.7* 6.7*  HCT 20.5* 22.1* 18.9*  WBC 20.3* 24.2* 24.3*  PLT 97* 133* 235    COAGULATION Recent Labs  Lab 07/23/17 1455 07/24/17 0350  INR 1.78 1.77    CARDIAC  No results for input(s): TROPONINI in the last 168 hours. No results for input(s): PROBNP in the last 168 hours.   CHEMISTRY Recent Labs  Lab 07/26/17 0429  07/27/17 0500 07/27/17 1630 07/28/17 0500 07/29/17 0457 07/30/17 0425  NA  --    < > 136  134* 138 138  137 140 139  K  --    < > 4.5  4.4 4.5 4.3  4.3 4.1 3.5  CL  --    < > 103  102 103 104  103 105 104  CO2  --    < > 24  23 26 26  25 24 25   GLUCOSE  --    < > 138*  136* 105*  122*  126* 141* 124*  BUN  --    < > 52*  52* 46* 52*  53* 68* 91*  CREATININE  --    < > 2.68*  2.71* 2.19* 2.38*  2.43*  2.44* 2.98* 3.70*  CALCIUM  --    < > 6.7*  6.5* 6.8* 6.9*  6.9* 7.2* 7.3*  MG 2.3  --  2.5*  --  2.4 2.4 2.3  PHOS  --    < > 2.9  2.9 2.7 2.5 4.4 3.9   < > = values in this interval not displayed.   Estimated Creatinine Clearance: 22.9 mL/min (A) (by C-G formula based on SCr of 3.7 mg/dL (H)).   LIVER Recent Labs  Lab 07/23/17 1455 07/24/17 0350 07/24/17 1235  07/25/17 0355  07/26/17 1608 07/27/17 0500 07/27/17 1630 07/28/17 0500 07/29/17 0457  AST 47*  --  56*  --  75*  --   --  81*  --  56* 44*  ALT 13*  --  24  --  29  --   --  28  --  27 24  ALKPHOS 78  --  32*  --  43  --   --  55  --  62 74  BILITOT 1.8*  --  0.8  --  0.8  --   --  1.1  --  1.4* 1.9*  PROT 7.2  --  4.2*  --  4.7*  --   --  4.4*  --  4.7* 5.4*  ALBUMIN 3.3*  --  1.6*  1.7*   < > 1.8*   < > 1.5* 1.5*  1.5* 1.7* 1.5*  1.5* 1.7*  INR 1.78 1.77  --   --   --   --   --   --   --   --   --    < > = values in this interval not displayed.     INFECTIOUS Recent Labs  Lab 07/23/17 1644  07/24/17 0539 07/24/17 1232 07/24/17 1721 07/25/17 0355 07/28/17 0500  LATICACIDVEN 3.3*   < >  --  3.3* 3.5*  --  1.7  PROCALCITON 124.45  --  >150.00  --   --  >150.00  --    < > = values in this interval not displayed.     ENDOCRINE CBG (last 3)  Recent  Labs    07/29/17 2322 07/30/17 0538 07/30/17 0746  GLUCAP 147* 114* 130*      IMAGING x48h  - image(s) personally visualized  -   highlighted in bold Dg Chest Port 1 View  Result Date: 07/30/2017 CLINICAL DATA:  Respiratory failure.  Intubated. EXAM: PORTABLE CHEST 1 VIEW COMPARISON:  07/29/2017 FINDINGS: Endotracheal tube is unchanged, positioned just below the clavicular heads and well above the carina. Right jugular and left subclavian catheters terminate over the SVC. Enteric tube courses into the left upper abdomen  with tip not imaged. The cardiomediastinal silhouette is unchanged. Veiling opacities remain in both lung bases compatible with pleural effusions. Bibasilar airspace opacity, including dense retrocardiac opacity in the left lower lobe, is unchanged. No pneumothorax is identified. IMPRESSION: Unchanged pleural effusions and bibasilar atelectasis or consolidation. Electronically Signed   By: Sebastian AcheAllen  Grady M.D.   On: 07/30/2017 06:54   Dg Chest Port 1 View  Result Date: 07/29/2017 CLINICAL DATA:  61 year old male postoperative day 5 status post repeat abdominal surgery and bowel resection for sepsis and bowel perforation. Intubated. EXAM: PORTABLE CHEST 1 VIEW COMPARISON:  07/28/2017 and earlier. FINDINGS: Portable AP semi upright view at 0412 hours. Endotracheal tube tip in good position between the clavicles and carina. Enteric tube courses to the abdomen, side hole not included. Stable dual lumen right IJ central line and single lumen left subclavian approach central line. Mediastinal contours remain normal. Veiling and confluent bibasilar pulmonary opacity persists and is greater on the left. No pneumothorax or pulmonary edema. Stable other than perhaps mildly improved left perihilar ventilation since yesterday. IMPRESSION: 1.  Stable lines and tubes. 2. No significant change in bibasilar opacity with left lower lobe collapse or consolidation. 3. No new cardiopulmonary abnormality. Electronically Signed   By: Odessa FlemingH  Hall M.D.   On: 07/29/2017 07:26    ASSESSMENT / PLAN:  Perforated viscus involving the distal small bowel s/p exploratory laparotomy with small bowel resection and placement of abdominal wound VAC; no anastomosis made peritonitis w/ E. coli bacteremia (pansensitive); septic shock resolved -felt d/t diverticular disease; back to the OR 12/6 underwent reexploration and creation of an ileostomy and closure of abdominal wall - On 07/30/2017 - continues on TNA Plan Continuing n.p.o. status Wound  care per surgery Day #8 Zosyn and antifungal; will wait on stopping antifungal until after CT scan results  New fever; white blood cell count climbing some. Plan Follow-up culture data Spoke with surgery plan for CT abdomen pelvis today; need to ensure no abscess  A. fib with rapid ventricular response, complicated by symptomatic bradycardia/prolonged cardiac pause/asystole Suspect bradycardia exacerbated by calcium channel blocker suspect A. fib is exacerbated by weaning and anemia.  He is now back in normal sinus rhythm Plan Continue telemetry monitoring  Ventilator dependence status post abdominal surgery - acute resp failure -Portable chest x-ray personally reviewed: Endotracheal tube, central line, and dialysis catheter are in satisfactory position.  Has stable right greater than left basilar volume loss which is likely effusion/atelectasis -Weaning on higher levels of pressure support however tachypneic with some accessory muscles.  Not ready for extubation Plan Continued daily assessment for weaning Mandatory rest on full support Adjusting diuretics (see below)  Suspect ongoing fever contributing to tachypnea  Acute kidney injury with progressive NAG metabolic acidosis & hyperkalemia At risk for fluid and electrolyte imbalance -> Now making urine, CRRT stopped on 12/9 his creatinine continues to climb Plan Nephrology following, he is hypertensive today.  We will increase Lasix back  to every 8 Continue intake output Follow-up in a.m. chemistry Hopefully can avoid going back on CRRT  Acute encephalopathy; suspect this is a mix of metabolic encephalopathy  h/o ETOH abuse  Plan Precedex for rascal -0--1 As needed fentanyl for pain Delirium interventions including lights on during daytime, encourage normal sleep, need to get him out of bed.  Will discuss with surgery, would probably need abdominal binder to do this    sepsis related thrombocytopenia - rule out HITT  07/27/17 Anemia - critical illness and some blood loss via CRRT -Received blood on 12/9 with appropriate hemoglobin bump - platelets increasing (off heparin since 07/27/17)  Plan Follow-up hit panel Continue SCDs Getting transfusion today 12/11 for hemoglobin drift down to 6.8 (1 unit ordered)  Protein calorie malnutrition Plan Continue TPN Diet when okay with surgery   DVT prophylaxis: SCD SUP: PPI bid  Diet: NPO + TNA Activity: Bedrest Disposition :ICU Family: none at bedside all week   Summary: Getting blood today for hemoglobin   Drift.  Still febrile with significant leukocytosis therefore we will go ahead and get CT imaging of the abdomen and pelvis.  Need to rule out abdominal abscess.  Will wait on changing antimicrobials and antifungals until after CT imaging.  If there is fluid collection suspect he will need interventional radiology for percutaneous drainage.  Renal function little worse today but still making urine.  Will increase Lasix back to q. 8. Ask nursing staff to transduce CVP.    My critical care time 40 minutes Simonne MartinetPeter E Babcock ACNP-BC [facility]Critical [facility] Pager # [phone] OR # [phone] if no answer

## 2017-07-30 NOTE — Progress Notes (Signed)
[facility] Progress Note  Subjective: good UOP on lasix, 2.5 L out yest, creat up some today. Pt on vent and sedated.   Vitals:   07/30/17 1211 07/30/17 1300 07/30/17 1400 07/30/17 1542  BP: 134/76 (!) 116/56 (!) 116/59   Pulse: 79 69 68 75  Resp: (!) 28 (!) 28 (!) 31 (!) 31  Temp:      TempSrc:      SpO2: 100% 100% 100% 100%  Weight:      Height:        Inpatient medications: . chlorhexidine gluconate (MEDLINE KIT)  15 mL Mouth Rinse BID  . Chlorhexidine Gluconate Cloth  6 each Topical Daily  . folic acid  1 mg Intravenous Daily  . insulin aspart  0-9 Units Subcutaneous Q6H  . iopamidol      . mouth rinse  15 mL Mouth Rinse QID  . pantoprazole (PROTONIX) IV  40 mg Intravenous Q12H  . sodium chloride flush  10-40 mL Intracatheter Q12H  . thiamine injection  100 mg Intravenous Daily   . anidulafungin Stopped (07/29/17 2243)  . dexmedetomidine (PRECEDEX) IV infusion 0.7 mcg/kg/hr (07/30/17 1437)  . piperacillin-tazobactam (ZOSYN)  IV 3.375 g (07/30/17 1343)  . TPN (CLINIMIX) Adult without lytes 100 mL/hr at 07/29/17 2100  . TPN (CLINIMIX) Adult without lytes     acetaminophen, fentaNYL (SUBLIMAZE) injection, iopamidol, sodium chloride flush  Exam: Intubated, on vent, sedated No jvd Chest coarse BS RRR  Abd distended, open abd wd w dressing, ileostomy in place Ext diffuse 2+ pitting edema Neuro on vent, sedated  CXR 12/10 > bilat LL effusions and vol loss CXR 12/11 > bilat LL effusions/ atx unchanged, no edema   Impression/Plan: 1.  Acute oliguric AKI: sp CRRT.  Doing better, making good urine now.  Will dc lasix, see if renal function will recover.  2.  Perforated viscus: s/p ileocecectomy 12/6.  + end-ileostomy.  Per CCS 3.  Shock: better, off pressors. Per primary. BP's good.  4.  Acute hypoxic RF: per PCCM. Remains on the vent, per cxr bilat effusions.  5.  Vol excess/ anasarca: will dc lasix as per #1  Kelly Splinter MD [place] pager [phone]   07/30/2017, 3:48 PM   Recent Labs  Lab 07/28/17 0500 07/29/17 0457 07/30/17 0425  NA 138  137 140 139  K 4.3  4.3 4.1 3.5  CL 104  103 105 104  CO2 '26  25 24 25  '$ GLUCOSE 122*  126* 141* 124*  BUN 52*  53* 68* 91*  CREATININE 2.38*  2.43*  2.44* 2.98* 3.70*  CALCIUM 6.9*  6.9* 7.2* 7.3*  PHOS 2.5 4.4 3.9   Recent Labs  Lab 07/27/17 0500 07/27/17 1630 07/28/17 0500 07/29/17 0457  AST 81*  --  56* 44*  ALT 28  --  27 24  ALKPHOS 55  --  62 74  BILITOT 1.1  --  1.4* 1.9*  PROT 4.4*  --  4.7* 5.4*  ALBUMIN 1.5*  1.5* 1.7* 1.5*  1.5* 1.7*   Recent Labs  Lab 07/28/17 1800 07/29/17 0457 07/30/17 0425 07/30/17 1032  WBC 20.3* 24.2* 24.3*  --   NEUTROABS 17.1* 21.5* 19.0*  --   HGB 6.8* 7.7* 6.7* 8.7*  HCT 20.5* 22.1* 18.9* 25.1*  MCV 89.1 87.0 85.9  --   PLT 97* 133* 235  --    Iron/TIBC/Ferritin/ %Sat No results found for: IRON, TIBC, FERRITIN, IRONPCTSAT

## 2017-07-30 NOTE — Progress Notes (Signed)
eLink Physician-Brief Progress Note Patient Name: Caleb LaundryFrederick Singh DOB: 03-25-1956 MRN: [id number]   Date of Service  07/30/2017  HPI/Events of Note  Hgb = 6.7.  eICU Interventions  Will transfuse 1 unit PRBC.      Intervention Category Major Interventions: Other:  [name],Mariya Mottley [place] 07/30/2017, 5:41 AM

## 2017-07-30 NOTE — Progress Notes (Signed)
Nutrition Follow-up  DOCUMENTATION CODES:   Not applicable  INTERVENTION:  - Continue TPN per Pharmacy. Plan to switch to Clinimix E 5/20 @ 100 mL/hr today.  - Continue to monitor medical course.   NUTRITION DIAGNOSIS:   Inadequate oral intake related to inability to eat as evidenced by NPO status. -ongoing  GOAL:   Patient will meet greater than or equal to 90% of their needs -met with TPN regimen  MONITOR:   Vent status, Weight trends, Labs, Skin, Other (Comment)(TPN regimen)  ASSESSMENT:   61 y.o. male with no significant past medical history complaining of severely worsening abdominal distention, pain, nonbloody, nonbilious, non-coffee-ground emesis and lack of bowel movements over the course of the last 3 days no BM in 48 hours, feels like he needs to deficate.  He denies prior abdominal surgeries, no fever or chills.  Patient is unsure if he is passing flatus.  He is never had any liver issues he does drink regularly but has never had any seizures or hallucinations when he does not drink, he has not had any alcohol in approximately 1 week.  He is never seen a gastroenterologist.   12/11 Pt remains intubated with NGT to LIS with 50cc yellow-green output at this time. No family/visitors have been in pt's room that RD has been able to talk to throughout entirety of hospitalization. Pt with triple lumen CVC. He is receiving Clinimix E 5/15 @ 100 mL/hr (ILE on hold per ICU protocol until 12/14). This regimen is providing 120 grams of protein, 1632 kcal. Estimated nutrition needs updated this AM and based on current vent settings, admission weight (79.4 kg) as RN assessment indicates moderate and severe edema throughout body, and CRRT was stopped on 12/9.   Spoke with Pharmacist concerning TPN. Plan is to change Clinimix E 5/20 @ 100 mL/hr which will provide 120 grams of protein, 2112 kcal (95% updated kcal need). Per Surgery PA note this AM, on 12/6 pt underwent reexploration of abd,  ileocecectomy, creation of end ileostomy, and closure of abdominal wall. Note states pt with post-op ileus. Plan for repeat CT scan (abd/pelvis) today to r/o abscess. CCM hopeful to keep pt off of CRRT.   Patient is currently intubated on ventilator support MV: 13.7 L/min Temp (24hrs), Avg:100.1 F (37.8 C), Min:98.1 F (36.7 C), Max:101.7 F (38.7 C) Propofol: none BP: 116/49 and MAP: 71  Medications reviewed; 1 mg IV folic acid/day, 40 mg IV Lasix BID, sliding scale Novolog, 40 mg IV Protonix BID, 100 mg IV thiamine/day.  Labs reviewed; CBGs: 114 and 130 mg/dL today, BUN: 91 mg/dL, creatinine: 3.7 mg/dL, Ca: 7.3 mg/dL, GFR: 19 mL/min.   Drip: Precedex @ 0.7 mcg/kg/hr.    12/7 - Pt went back to the OR yesterday and RUQ ileostomy now in place.  - He remains intubated with NGT to LIS and 100cc out since 4:00 AM today.  - Estimated nutrition needs based on [facility] and needs for pt on CRRT, which was started 12/5 evening.   Patient is currently intubated on ventilator support MV: 12.4 L/min Temp (24hrs), Avg:98.2 F (36.8 C), Min:97.1 F (36.2 C), Max:98.6 F (37 C) Propofol: 4.8 ml/hr (127 kcal) BP: 97/62 and MAP: 76 Lab; Phos: 4.7 mg/dL Drips: Propofol @ 9 mcg/kg/min, Fentanyl @ 200 mcg/hr, Levo @ 20 mcg/min, Vaso @ 0.3 units/min.  ADDENDUM: Reviewed Pharmacy note entered after this RD's note this AM. Plan to initiate Clinimix 5/15 @ 40 mL/hr tonight; ILE on hold x7 days per  ICU protocol. Goal for TPN at this time: Clinimix 5/15 @ 115 mL/hr which will provide 132 grams of protein (94% minimum estimated protein need), 1874 kcal (92% estimated kcal need).     12/5 - NGT to R nare with 1500cc dark green output in canister at this time.  - Pt is POD #1 ex lap, small bowel resection with anastomosis and placement of wound vac 2/2 perforated distal small bowel with questionable cecal ischemia.  - No family/visitors present to provide PTA information.  - Noted  information from H&P, which is outlined above.  - Pt receiving 669 kcal from Propofol + D5 IVF.  Patient is currently intubated on ventilator support MV:17.1L/min Temp (24hrs), Avg:98.9 F (37.2 C), Min:98.4 F (36.9 C), Max:100 F (37.8 C) Propofol:6.72m/hr (169 kcal) BP: 117/69 and MAP: 86  Medication; 2 g IV Mg sulfate x1 run today Lab; Mg: 1.2 mg/dL IVF:D5-150 mEq KCl @ 125 mL/hr (510 kcal) Drips: Fentanyl @ 125 mcg/hr, Levo @ 10 mcg/min, Propofol @ 12 mcg/min/kg.       Diet Order:  Diet NPO time specified TPN (CLINIMIX) Adult without lytes  EDUCATION NEEDS:   No education needs have been identified at this time  Skin:  Skin Assessment: Skin Integrity Issues: Skin Integrity Issues:: Incisions Incisions: Abdominal on 12/4 with wound vac  Last BM:  50 mL via ileostomy this AM  Height:   Ht Readings from Last 1 Encounters:  07/25/17 '5\' 10"'$  (1.778 m)    Weight:   Wt Readings from Last 1 Encounters:  07/30/17 194 lb 14.2 oz (88.4 kg)    Ideal Body Weight:  75.45 kg  BMI:  Body mass index is 27.96 kg/m.  Estimated Nutritional Needs:   Kcal:  2211  Protein:  119 grams (1.5 grams/kg)  Fluid:  >2 L/day     JJarome Matin MS, RD, LDN, CCoral Springs Surgicenter LtdInpatient Clinical Dietitian Pager # [phone] hours/weekend pager # [phone]

## 2017-07-30 NOTE — Progress Notes (Signed)
5 Days Post-Op    CC: abdominal pain  Subjective: Patient continues to have fevers, ongoing leukocytosis.  Some regression in his renal function, anemia.  He remains sedated and is in mittens but does not open his eyes or answer questions.  He remains on the ventilator.  No bowel sounds on exam.  Abdomen remains distended and tight.  Midline abdominal wound with some serous drainage, no granulation, he is probably going to loose skin around his umbilicus.  Objective: Vital signs in last 24 hours: Temp:  [98.1 F (36.7 C)-101.7 F (38.7 C)] 100 F (37.8 C) (12/11 0700) Pulse Rate:  [70-96] 73 (12/11 0700) Resp:  [16-25] 22 (12/11 0700) BP: (85-131)/(43-82) 85/43 (12/11 0700) SpO2:  [100 %] 100 % (12/11 0754) FiO2 (%):  [40 %] 40 % (12/11 0754) Weight:  [88.4 kg (194 lb 14.2 oz)] 88.4 kg (194 lb 14.2 oz) (12/11 0307) Last BM Date: (green bile from ostomy) 2800 IV 2630 urine NG 200  Ileostomy 350 TM 101.7  BP down some early this AM Creatinine is going up again WBC is still up 24.3K H/H declining - transfused Intake/Output from previous day: 12/10 0701 - 12/11 0700 In: 2808.2 [I.V.:2083.2; Blood:315; IV Piggyback:410] Out: 1610 [Urine:2630; Emesis/NG output:200; Stool:350] Intake/Output this shift: No intake/output data recorded.  General appearance: toxic, still sedated on Vent, mittens in place Resp: rhonchi anterior exam GI: No bowel sounds, abdomen remains distended.  Some drainage from the ileostomy.  Midline abdominal wound is indolent with serous drainage of midline.  Lab Results:  Recent Labs    07/29/17 0457 07/30/17 0425  WBC 24.2* 24.3*  HGB 7.7* 6.7*  HCT 22.1* 18.9*  PLT 133* 235    BMET Recent Labs    07/29/17 0457 07/30/17 0425  NA 140 139  K 4.1 3.5  CL 105 104  CO2 24 25  GLUCOSE 141* 124*  BUN 68* 91*  CREATININE 2.98* 3.70*  CALCIUM 7.2* 7.3*   PT/INR No results for input(s): LABPROT, INR in the last 72 hours.  Recent Labs  Lab  07/24/17 1235  07/25/17 0355  07/26/17 1608 07/27/17 0500 07/27/17 1630 07/28/17 0500 07/29/17 0457  AST 56*  --  75*  --   --  81*  --  56* 44*  ALT 24  --  29  --   --  28  --  27 24  ALKPHOS 32*  --  43  --   --  55  --  62 74  BILITOT 0.8  --  0.8  --   --  1.1  --  1.4* 1.9*  PROT 4.2*  --  4.7*  --   --  4.4*  --  4.7* 5.4*  ALBUMIN 1.6*  1.7*   < > 1.8*   < > 1.5* 1.5*  1.5* 1.7* 1.5*  1.5* 1.7*   < > = values in this interval not displayed.     Lipase     Component Value Date/Time   LIPASE 47 07/23/2017 1455     Medications: . chlorhexidine gluconate (MEDLINE KIT)  15 mL Mouth Rinse BID  . Chlorhexidine Gluconate Cloth  6 each Topical Daily  . folic acid  1 mg Intravenous Daily  . furosemide  40 mg Intravenous Q12H  . insulin aspart  0-9 Units Subcutaneous Q6H  . mouth rinse  15 mL Mouth Rinse QID  . pantoprazole (PROTONIX) IV  40 mg Intravenous Q12H  . sodium chloride flush  10-40 mL Intracatheter Q12H  .  thiamine injection  100 mg Intravenous Daily   . anidulafungin Stopped (07/29/17 2243)  . dexmedetomidine (PRECEDEX) IV infusion Stopped (07/30/17 0715)  . piperacillin-tazobactam (ZOSYN)  IV 3.375 g (07/30/17 0519)  . TPN (CLINIMIX) Adult without lytes 100 mL/hr at 07/29/17 2100   Anti-infectives (From admission, onward)   Start     Dose/Rate Route Frequency Ordered Stop   07/29/17 0600  piperacillin-tazobactam (ZOSYN) IVPB 3.375 g     3.375 g 12.5 mL/hr over 240 Minutes Intravenous Every 8 hours 07/29/17 0518     07/25/17 2000  vancomycin (VANCOCIN) IVPB 1000 mg/200 mL premix  Status:  Discontinued     1,000 mg 200 mL/hr over 60 Minutes Intravenous Every 48 hours 07/23/17 2042 07/24/17 0956   07/24/17 2200  piperacillin-tazobactam (ZOSYN) 3.375 g in dextrose 5 % 50 mL IVPB  Status:  Discontinued     3.375 g 100 mL/hr over 30 Minutes Intravenous Every 6 hours 07/24/17 2006 07/29/17 0518   07/24/17 2100  anidulafungin (ERAXIS) 100 mg in sodium chloride  0.9 % 100 mL IVPB     100 mg 78 mL/hr over 100 Minutes Intravenous Every 24 hours 07/23/17 2039     07/24/17 0000  anidulafungin (ERAXIS) 100 mg in sodium chloride 0.9 % 100 mL IVPB  Status:  Discontinued     100 mg 78 mL/hr over 100 Minutes Intravenous Every 24 hours 07/23/17 2038 07/23/17 2039   07/23/17 2200  piperacillin-tazobactam (ZOSYN) IVPB 3.375 g  Status:  Discontinued     3.375 g 12.5 mL/hr over 240 Minutes Intravenous Every 8 hours 07/23/17 2043 07/24/17 2006   07/23/17 2100  anidulafungin (ERAXIS) 200 mg in sodium chloride 0.9 % 200 mL IVPB     200 mg 78 mL/hr over 200 Minutes Intravenous  Once 07/23/17 2038 07/24/17 0239   07/23/17 2100  vancomycin (VANCOCIN) 1,500 mg in sodium chloride 0.9 % 500 mL IVPB     1,500 mg 250 mL/hr over 120 Minutes Intravenous  Once 07/23/17 2041 07/23/17 2317   07/23/17 1515  piperacillin-tazobactam (ZOSYN) IVPB 3.375 g     3.375 g 100 mL/hr over 30 Minutes Intravenous  Once 07/23/17 1511 07/23/17 1611      Assessment/Plan Perforated distal small boweland ischemia of cecum 1.  S/Pexploratory laparotomy, small bowel resection without anastomosis, placement of open wound VAC system 07/23/17 Dr. Randall Hiss Wilson/Dr. Fanny Skates 2.  S/p Reexploration of abdomen, ileocecectomy, creation of end ileostomy,closure of abdominal wall, 07/25/17, Dr. [name]  - Post op ileus   Sepsis/shock -vasopressors off AF - Asystole 07/29/17 - no Code Blue  - Cardizem currently off E coli Bacteremia - Zosyn Acute respiratory failure - Ventilator per CCM Acute renal failure-CRRT discontinued 07/27/17 Malnutrition - Prealbumin <5 Anemia - transfused 1 Unit PRBC 07/27/17, 07/29/17, and getting transfused now History of heavy alcohol use -sedated on ventilatior History of tobacco use FEN: TPN/IV fluids ID: Vancomycin 07/23/17 - 07/24/17, Zosyn 07/23/17=>>day 8,  anidulafungin 07/23/17 =>>day 8 MVH:QION - Severe anemia Follow-up:Dr. Greer Pickerel   Plan: Discussed with CCM.  His last CT scan was on 07/23/17.  Will repeat a CT scan today, check urine, he is being transfused now.  Continue medical management.       LOS: 7 days    Nioka Thorington 07/30/2017 [phone]

## 2017-07-30 NOTE — Progress Notes (Signed)
eLink Physician-Brief Progress Note Patient Name: Caleb Singh DOB: 11-04-1955 MRN: [id number]   Date of Service  07/30/2017  HPI/Events of Note  Agitation - Patient pulling at leads and ETT. Request for bilateral soft wrist restraints.   eICU Interventions  Will order bilateral soft wrist restraints.      Intervention Category Minor Interventions: Agitation / anxiety - evaluation and management  Sommer,[name] 07/30/2017, 8:12 PM

## 2017-07-31 ENCOUNTER — Inpatient Hospital Stay (HOSPITAL_COMMUNITY): Payer: Non-veteran care | Admitting: Certified Registered Nurse Anesthetist

## 2017-07-31 ENCOUNTER — Encounter (HOSPITAL_COMMUNITY)
Admission: EM | Disposition: A | Discharge status: Skilled Nursing Facility | Payer: Self-pay | Source: Home / Self Care | Attending: Internal Medicine

## 2017-07-31 DIAGNOSIS — K668 Other specified disorders of peritoneum: Secondary | ICD-10-CM

## 2017-07-31 HISTORY — PX: LAPAROTOMY: SHX154

## 2017-07-31 LAB — CBC WITH DIFFERENTIAL/PLATELET
BASOS PCT: 1 %
Basophils Absolute: 0.3 10*3/uL — ABNORMAL HIGH (ref 0.0–0.1)
EOS PCT: 0 %
Eosinophils Absolute: 0 10*3/uL (ref 0.0–0.7)
HEMATOCRIT: 23.3 % — AB (ref 39.0–52.0)
Hemoglobin: 7.9 g/dL — ABNORMAL LOW (ref 13.0–17.0)
Lymphocytes Relative: 7 %
Lymphs Abs: 2.1 10*3/uL (ref 0.7–4.0)
MCH: 29.8 pg (ref 26.0–34.0)
MCHC: 33.9 g/dL (ref 30.0–36.0)
MCV: 87.9 fL (ref 78.0–100.0)
MONO ABS: 0.3 10*3/uL (ref 0.1–1.0)
Monocytes Relative: 1 %
NEUTROS ABS: 26.8 10*3/uL — AB (ref 1.7–7.7)
NEUTROS PCT: 91 %
Platelets: 375 10*3/uL (ref 150–400)
RBC: 2.65 MIL/uL — ABNORMAL LOW (ref 4.22–5.81)
RDW: 15.7 % — AB (ref 11.5–15.5)
WBC: 29.5 10*3/uL — ABNORMAL HIGH (ref 4.0–10.5)

## 2017-07-31 LAB — GLUCOSE, CAPILLARY
Glucose-Capillary: 140 mg/dL — ABNORMAL HIGH (ref 65–99)
Glucose-Capillary: 158 mg/dL — ABNORMAL HIGH (ref 65–99)
Glucose-Capillary: 171 mg/dL — ABNORMAL HIGH (ref 65–99)
Glucose-Capillary: 198 mg/dL — ABNORMAL HIGH (ref 65–99)

## 2017-07-31 LAB — TYPE AND SCREEN
ABO/RH(D): O POS
ANTIBODY SCREEN: NEGATIVE
Unit division: 0

## 2017-07-31 LAB — BASIC METABOLIC PANEL
ANION GAP: 10 (ref 5–15)
BUN: 87 mg/dL — AB (ref 6–20)
CO2: 21 mmol/L — AB (ref 22–32)
Calcium: 7.4 mg/dL — ABNORMAL LOW (ref 8.9–10.3)
Chloride: 109 mmol/L (ref 101–111)
Creatinine, Ser: 3.14 mg/dL — ABNORMAL HIGH (ref 0.61–1.24)
GFR calc Af Amer: 23 mL/min — ABNORMAL LOW (ref >60)
GFR calc non Af Amer: 20 mL/min — ABNORMAL LOW (ref >60)
Glucose, Bld: 209 mg/dL — ABNORMAL HIGH (ref 65–99)
POTASSIUM: 3.4 mmol/L — AB (ref 3.5–5.1)
Sodium: 140 mmol/L (ref 135–145)

## 2017-07-31 LAB — PROTIME-INR
INR: 1.58
PROTHROMBIN TIME: 18.7 s — AB (ref 11.4–15.2)

## 2017-07-31 LAB — BPAM RBC
BLOOD PRODUCT EXPIRATION DATE: 201901072359
ISSUE DATE / TIME: 201812110640
UNIT TYPE AND RH: 5100

## 2017-07-31 LAB — URINE CULTURE: CULTURE: NO GROWTH

## 2017-07-31 LAB — PATHOLOGIST SMEAR REVIEW

## 2017-07-31 SURGERY — LAPAROTOMY, EXPLORATORY
Anesthesia: General | Site: Abdomen

## 2017-07-31 MED ORDER — DEXAMETHASONE SODIUM PHOSPHATE 10 MG/ML IJ SOLN
INTRAMUSCULAR | Status: DC | PRN
  Administered 2017-07-31: 5 mg via INTRAVENOUS

## 2017-07-31 MED ORDER — SODIUM CHLORIDE 0.9 % IV SOLN
INTRAVENOUS | Status: DC | PRN
  Administered 2017-07-31: 04:00:00 via INTRAVENOUS

## 2017-07-31 MED ORDER — MIDAZOLAM HCL 2 MG/2ML IJ SOLN
INTRAMUSCULAR | Status: AC
  Filled 2017-07-31: qty 2

## 2017-07-31 MED ORDER — ONDANSETRON HCL 4 MG/2ML IJ SOLN
INTRAMUSCULAR | Status: DC | PRN
  Administered 2017-07-31: 4 mg via INTRAVENOUS

## 2017-07-31 MED ORDER — FENTANYL CITRATE (PF) 100 MCG/2ML IJ SOLN
INTRAMUSCULAR | Status: DC | PRN
  Administered 2017-07-31: 100 ug via INTRAVENOUS
  Administered 2017-07-31 (×2): 50 ug via INTRAVENOUS

## 2017-07-31 MED ORDER — TRACE MINERALS CR-CU-MN-SE-ZN 10-1000-500-60 MCG/ML IV SOLN
INTRAVENOUS | Status: AC
  Administered 2017-07-31: 18:00:00 via INTRAVENOUS
  Filled 2017-07-31: qty 2400
  Filled 2017-07-31: qty 2000

## 2017-07-31 MED ORDER — POTASSIUM CHLORIDE 10 MEQ/50ML IV SOLN
10.0000 meq | INTRAVENOUS | Status: AC
  Administered 2017-07-31 (×6): 10 meq via INTRAVENOUS
  Filled 2017-07-31 (×6): qty 50

## 2017-07-31 MED ORDER — FENTANYL CITRATE (PF) 100 MCG/2ML IJ SOLN
INTRAMUSCULAR | Status: AC
  Filled 2017-07-31: qty 2

## 2017-07-31 MED ORDER — ROCURONIUM BROMIDE 50 MG/5ML IV SOSY
PREFILLED_SYRINGE | INTRAVENOUS | Status: DC | PRN
  Administered 2017-07-31: 50 mg via INTRAVENOUS
  Administered 2017-07-31: 20 mg via INTRAVENOUS
  Administered 2017-07-31: 50 mg via INTRAVENOUS
  Administered 2017-07-31: 30 mg via INTRAVENOUS

## 2017-07-31 MED ORDER — POTASSIUM CHLORIDE 10 MEQ/100ML IV SOLN: 10.0000 meq | INTRAVENOUS | Status: DC

## 2017-07-31 MED ORDER — FENTANYL CITRATE (PF) 250 MCG/5ML IJ SOLN
INTRAMUSCULAR | Status: AC
  Filled 2017-07-31: qty 5

## 2017-07-31 MED ORDER — SUGAMMADEX SODIUM 200 MG/2ML IV SOLN
INTRAVENOUS | Status: DC | PRN
  Administered 2017-07-31: 200 mg via INTRAVENOUS

## 2017-07-31 MED ORDER — MIDAZOLAM HCL 5 MG/ML IJ SOLN
2.0000 mg | INTRAMUSCULAR | Status: DC | PRN
  Administered 2017-07-31: 2 mg via INTRAVENOUS
  Filled 2017-07-31: qty 1

## 2017-07-31 MED ORDER — DEXMEDETOMIDINE HCL IN NACL 400 MCG/100ML IV SOLN
0.2000 ug/kg/h | INTRAVENOUS | Status: DC
  Administered 2017-07-31 – 2017-08-01 (×8): 1.2 ug/kg/h via INTRAVENOUS
  Administered 2017-08-02: 0.6 ug/kg/h via INTRAVENOUS
  Administered 2017-08-02 (×2): 1.2 ug/kg/h via INTRAVENOUS
  Administered 2017-08-02 – 2017-08-03 (×3): 1 ug/kg/h via INTRAVENOUS
  Administered 2017-08-03: 0.7 ug/kg/h via INTRAVENOUS
  Administered 2017-08-03: 1 ug/kg/h via INTRAVENOUS
  Administered 2017-08-04: 0.7 ug/kg/h via INTRAVENOUS
  Administered 2017-08-04: 0.6 ug/kg/h via INTRAVENOUS
  Filled 2017-07-31 (×22): qty 100

## 2017-07-31 MED ORDER — MIDAZOLAM HCL 5 MG/5ML IJ SOLN
INTRAMUSCULAR | Status: DC | PRN
  Administered 2017-07-31: 2 mg via INTRAVENOUS

## 2017-07-31 MED ORDER — 0.9 % SODIUM CHLORIDE (POUR BTL) OPTIME
TOPICAL | Status: DC | PRN
Start: 2017-07-31 — End: 2017-07-31
  Administered 2017-07-31: 6000 mL

## 2017-07-31 MED ORDER — ALBUMIN HUMAN 5 % IV SOLN
INTRAVENOUS | Status: DC | PRN
  Administered 2017-07-31 (×2): via INTRAVENOUS

## 2017-07-31 MED ORDER — DEXMEDETOMIDINE HCL IN NACL 200 MCG/50ML IV SOLN
INTRAVENOUS | Status: AC
  Filled 2017-07-31: qty 100

## 2017-07-31 MED ORDER — POTASSIUM CHLORIDE 10 MEQ/50ML IV SOLN
10.0000 meq | INTRAVENOUS | Status: DC
  Filled 2017-07-31 (×2): qty 50

## 2017-07-31 MED ORDER — ALBUMIN HUMAN 5 % IV SOLN
INTRAVENOUS | Status: AC
  Filled 2017-07-31: qty 500

## 2017-07-31 MED ORDER — PROPOFOL 10 MG/ML IV BOLUS
INTRAVENOUS | Status: AC
  Filled 2017-07-31: qty 20

## 2017-07-31 MED ORDER — FUROSEMIDE 10 MG/ML IJ SOLN
40.0000 mg | Freq: Three times a day (TID) | INTRAMUSCULAR | Status: AC
  Administered 2017-07-31 – 2017-08-01 (×3): 40 mg via INTRAVENOUS
  Filled 2017-07-31 (×4): qty 4

## 2017-07-31 SURGICAL SUPPLY — 36 items
APPLICATOR COTTON TIP 6IN STRL (MISCELLANEOUS) ×1 IMPLANT
BLADE EXTENDED COATED 6.5IN (ELECTRODE) ×2 IMPLANT
BLADE HEX COATED 2.75 (ELECTRODE) ×3 IMPLANT
COVER MAYO STAND STRL (DRAPES) ×2 IMPLANT
COVER SURGICAL LIGHT HANDLE (MISCELLANEOUS) ×3 IMPLANT
DRAIN CHANNEL 19F RND (DRAIN) ×4 IMPLANT
DRAPE LAPAROSCOPIC ABDOMINAL (DRAPES) ×3 IMPLANT
DRAPE WARM FLUID 44X44 (DRAPE) ×2 IMPLANT
ELECT REM PT RETURN 15FT ADLT (MISCELLANEOUS) ×3 IMPLANT
EVACUATOR SILICONE 100CC (DRAIN) ×4 IMPLANT
GAUZE SPONGE 4X4 12PLY STRL (GAUZE/BANDAGES/DRESSINGS) ×3 IMPLANT
GLOVE BIOGEL PI IND STRL 7.0 (GLOVE) ×1 IMPLANT
GLOVE BIOGEL PI INDICATOR 7.0 (GLOVE) ×2
GOWN STRL REUS W/TWL LRG LVL3 (GOWN DISPOSABLE) ×3 IMPLANT
GOWN STRL REUS W/TWL XL LVL3 (GOWN DISPOSABLE) ×6 IMPLANT
HANDLE SUCTION POOLE (INSTRUMENTS) IMPLANT
KIT BASIN OR (CUSTOM PROCEDURE TRAY) ×3 IMPLANT
NS IRRIG 1000ML POUR BTL (IV SOLUTION) ×5 IMPLANT
PACK GENERAL/GYN (CUSTOM PROCEDURE TRAY) ×3 IMPLANT
SPONGE LAP 18X18 X RAY DECT (DISPOSABLE) IMPLANT
STAPLER VISISTAT 35W (STAPLE) ×1 IMPLANT
SUCTION POOLE HANDLE (INSTRUMENTS) ×3
SUT ETHILON 2 0 PS N (SUTURE) ×4 IMPLANT
SUT NOVA 1 T20/GS 25DT (SUTURE) ×4 IMPLANT
SUT PDS AB 1 CTX 36 (SUTURE) IMPLANT
SUT SILK 2 0 (SUTURE) ×3
SUT SILK 2 0 SH CR/8 (SUTURE) ×2 IMPLANT
SUT SILK 2-0 18XBRD TIE 12 (SUTURE) IMPLANT
SUT SILK 3 0 (SUTURE) ×3
SUT SILK 3 0 SH CR/8 (SUTURE) ×2 IMPLANT
SUT SILK 3-0 18XBRD TIE 12 (SUTURE) IMPLANT
SWAB COLLECTION DEVICE MRSA (MISCELLANEOUS) ×2 IMPLANT
TAPE CLOTH SURG 4X10 WHT LF (GAUZE/BANDAGES/DRESSINGS) ×2 IMPLANT
TOWEL OR 17X26 10 PK STRL BLUE (TOWEL DISPOSABLE) ×4 IMPLANT
TRAY FOLEY W/METER SILVER 16FR (SET/KITS/TRAYS/PACK) IMPLANT
YANKAUER SUCT BULB TIP NO VENT (SUCTIONS) IMPLANT

## 2017-07-31 NOTE — Anesthesia Postprocedure Evaluation (Signed)
Anesthesia Post Note  Patient: Caleb Singh  Procedure(s) Performed: EXPLORATORY LAPAROTOMY drainage of abdominal abcess (N/A Abdomen)     Patient location during evaluation: SICU Anesthesia Type: General Level of consciousness: sedated Pain management: pain level controlled Vital Signs Assessment: post-procedure vital signs reviewed and stable Respiratory status: patient remains intubated per anesthesia plan Cardiovascular status: stable Postop Assessment: no apparent nausea or vomiting Anesthetic complications: no    Last Vitals:  Vitals:   07/31/17 0200 07/31/17 0600  BP: (!) 138/53   Pulse: 76   Resp: (!) 24   Temp:  37.8 C  SpO2: 100%     Last Pain:  Vitals:   07/31/17 0600  TempSrc: Oral  PainSc:                  Shaquila Sigman DANIEL

## 2017-07-31 NOTE — Progress Notes (Signed)
Pharmacy Antibiotic Note  Lamar LaundryFrederick Conrad is a 61 y.o. male admitted on 07/23/2017 with sepsis, IAI, bowel perforation.  Pharmacy has been consulted for Zosyn dosing.  S/p OR on 12/4, 12/7, 12/12.  Day #9 Zosyn and Eraxis. Back to OR early this morning. Abscesses drained and sent for culture.  Patient off CRRT 12/9, SCr elevated with some improvement this morning, UOP good.  Plan:  Continue Zosyn 3.375gm IV q8h (4hr extended infusions) for CrCl>20 ml/min.  Continue Eraxis 100 mg IV q24h per MD  F/u SCr and cultures.  Height: 5\' 10"  (177.8 cm) Weight: 193 lb 2 oz (87.6 kg) IBW/kg (Calculated) : 73  Temp (24hrs), Avg:99.5 F (37.5 C), Min:98.5 F (36.9 C), Max:100.7 F (38.2 C)  Recent Labs  Lab 07/24/17 1232  07/24/17 1721  07/27/17 1630  07/28/17 0500 07/28/17 1800 07/29/17 0457 07/30/17 0425 07/31/17 0745  WBC  --   --   --    < >  --    < > 19.0* 20.3* 24.2* 24.3* 29.5*  CREATININE  --    < >  --    < > 2.19*  --  2.38*  2.43*  2.44*  --  2.98* 3.70* 3.14*  LATICACIDVEN 3.3*  --  3.5*  --   --   --  1.7  --   --   --   --    < > = values in this interval not displayed.    Estimated Creatinine Clearance: 26.8 mL/min (A) (by C-G formula based on SCr of 3.14 mg/dL (H)).    No Known Allergies  Antimicrobials this admission: 12/4 Vancomycin >> 12/5 12/4 Zosyn >>  12/4 Eraxis >>   Dose adjustments this admission:   Microbiology results: 12/4 BCx: 1of 3 bottles GNR (BCID = E.coli, no resistance) PANSENSITIVE 12/10 BCx: ngtd 12/11 UCx: NGF 12/12 abscess cx:   Thank you for allowing pharmacy to be a part of this patient's care.  Clance BollAmanda Adolpho Meenach, PharmD, BCPS Pager: [phone] 07/31/2017 9:21 AM

## 2017-07-31 NOTE — Transfer of Care (Signed)
Immediate Anesthesia Transfer of Care Note  Patient: Caleb Singh  Procedure(s) Performed: EXPLORATORY LAPAROTOMY drainage of abdominal abcess (N/A Abdomen)  Patient Location: PACU and ICU  Anesthesia Type:General  Level of Consciousness: sedated and Patient remains intubated per anesthesia plan  Airway & Oxygen Therapy: Patient remains intubated per anesthesia plan and Patient placed on Ventilator (see vital sign flow sheet for setting)  Post-op Assessment: Report given to RN and Post -op Vital signs reviewed and stable  Post vital signs: Reviewed and stable  Last Vitals:  Vitals:   07/31/17 0100 07/31/17 0200  BP: (!) 128/49 (!) 138/53  Pulse: 72 76  Resp: (!) 22 (!) 24  Temp:    SpO2: 100% 100%    Last Pain:  Vitals:   07/31/17 0000  TempSrc: Oral  PainSc:          Complications: No apparent anesthesia complications

## 2017-07-31 NOTE — Progress Notes (Signed)
Day of Surgery    WE:[id number] pain  Subjective: Sedated and just back a short time ago from the PACU.  He is making urine, 2 drains one is bloody in appearance and the second is serous fluid.  He is in sinus rhythm and VSS.  Objective: Vital signs in last 24 hours: Temp:  [98.5 F (36.9 C)-101 F (38.3 C)] 100.1 F (37.8 C) (12/12 0600) Pulse Rate:  [28-83] 66 (12/12 0700) Resp:  [21-38] 22 (12/12 0700) BP: (116-154)/(49-87) 132/60 (12/12 0700) SpO2:  [91 %-100 %] 100 % (12/12 0700) FiO2 (%):  [40 %] 40 % (12/12 0400) Weight:  [87.6 kg (193 lb 2 oz)] 87.6 kg (193 lb 2 oz) (12/12 0500) Last BM Date: (green bile from ostomy) 3000 IV 2975 urine NG 275 Drains 110 Colostomy 50 Intermittent low grade fevers, VSS off pressors Labs are pending CT exam completed 10:18 PM;  Exam ordered 8:37 AM   Intake/Output from previous day: 12/11 0701 - 12/12 0700 In: 2944.9 [I.V.:2394.9; IV Piggyback:550] Out: 8938 [Urine:2975; Emesis/NG output:275; Drains:110; Stool:50] Intake/Output this shift: No intake/output data recorded.  General appearance: sedated on the vent,  Resp: clear to auscultation bilaterally and anterior exam GI: dressing in place, NO BS, I did not take the dressing down.  2 drains Drain 1 is serous and drain 2 is bloody  Lab Results:  Recent Labs    07/29/17 0457 07/30/17 0425 07/30/17 1032  WBC 24.2* 24.3*  --   HGB 7.7* 6.7* 8.7*  HCT 22.1* 18.9* 25.1*  PLT 133* 235  --     BMET Recent Labs    07/29/17 0457 07/30/17 0425  NA 140 139  K 4.1 3.5  CL 105 104  CO2 24 25  GLUCOSE 141* 124*  BUN 68* 91*  CREATININE 2.98* 3.70*  CALCIUM 7.2* 7.3*   PT/INR No results for input(s): LABPROT, INR in the last 72 hours.  Recent Labs  Lab 07/24/17 1235  07/25/17 0355  07/26/17 1608 07/27/17 0500 07/27/17 1630 07/28/17 0500 07/29/17 0457  AST 56*  --  75*  --   --  81*  --  56* 44*  ALT 24  --  29  --   --  28  --  27 24  ALKPHOS 32*  --  43  --    --  55  --  62 74  BILITOT 0.8  --  0.8  --   --  1.1  --  1.4* 1.9*  PROT 4.2*  --  4.7*  --   --  4.4*  --  4.7* 5.4*  ALBUMIN 1.6*  1.7*   < > 1.8*   < > 1.5* 1.5*  1.5* 1.7* 1.5*  1.5* 1.7*   < > = values in this interval not displayed.     Lipase     Component Value Date/Time   LIPASE 47 07/23/2017 1455     Medications: . chlorhexidine gluconate (MEDLINE KIT)  15 mL Mouth Rinse BID  . Chlorhexidine Gluconate Cloth  6 each Topical Daily  . folic acid  1 mg Intravenous Daily  . insulin aspart  0-9 Units Subcutaneous Q6H  . mouth rinse  15 mL Mouth Rinse QID  . midazolam      . pantoprazole (PROTONIX) IV  40 mg Intravenous Q12H  . sodium chloride flush  10-40 mL Intracatheter Q12H  . thiamine injection  100 mg Intravenous Daily   . anidulafungin Stopped (07/30/17 2302)  . dexmedetomidine    .  dexmedetomidine (PRECEDEX) IV infusion    . piperacillin-tazobactam (ZOSYN)  IV 3.375 g (07/31/17 2761)  . TPN (CLINIMIX) Adult without lytes 100 mL/hr at 07/31/17 0600   Anti-infectives (From admission, onward)   Start     Dose/Rate Route Frequency Ordered Stop   07/29/17 0600  piperacillin-tazobactam (ZOSYN) IVPB 3.375 g     3.375 g 12.5 mL/hr over 240 Minutes Intravenous Every 8 hours 07/29/17 0518     07/25/17 2000  vancomycin (VANCOCIN) IVPB 1000 mg/200 mL premix  Status:  Discontinued     1,000 mg 200 mL/hr over 60 Minutes Intravenous Every 48 hours 07/23/17 2042 07/24/17 0956   07/24/17 2200  piperacillin-tazobactam (ZOSYN) 3.375 g in dextrose 5 % 50 mL IVPB  Status:  Discontinued     3.375 g 100 mL/hr over 30 Minutes Intravenous Every 6 hours 07/24/17 2006 07/29/17 0518   07/24/17 2100  anidulafungin (ERAXIS) 100 mg in sodium chloride 0.9 % 100 mL IVPB     100 mg 78 mL/hr over 100 Minutes Intravenous Every 24 hours 07/23/17 2039     07/24/17 0000  anidulafungin (ERAXIS) 100 mg in sodium chloride 0.9 % 100 mL IVPB  Status:  Discontinued     100 mg 78 mL/hr over 100  Minutes Intravenous Every 24 hours 07/23/17 2038 07/23/17 2039   07/23/17 2200  piperacillin-tazobactam (ZOSYN) IVPB 3.375 g  Status:  Discontinued     3.375 g 12.5 mL/hr over 240 Minutes Intravenous Every 8 hours 07/23/17 2043 07/24/17 2006   07/23/17 2100  anidulafungin (ERAXIS) 200 mg in sodium chloride 0.9 % 200 mL IVPB     200 mg 78 mL/hr over 200 Minutes Intravenous  Once 07/23/17 2038 07/24/17 0239   07/23/17 2100  vancomycin (VANCOCIN) 1,500 mg in sodium chloride 0.9 % 500 mL IVPB     1,500 mg 250 mL/hr over 120 Minutes Intravenous  Once 07/23/17 2041 07/23/17 2317   07/23/17 1515  piperacillin-tazobactam (ZOSYN) IVPB 3.375 g     3.375 g 100 mL/hr over 30 Minutes Intravenous  Once 07/23/17 1511 07/23/17 1611      Assessment/Plan Perforated distal small boweland ischemia of cecum 1.S/Pexploratory laparotomy, small bowel resection without anastomosis, placement of open wound VAC system 07/23/17 Dr. Randall Hiss Wilson/Dr. Fanny Skates 2. S/p Reexploration of abdomen, ileocecectomy, creation of end ileostomy,closure of abdominal wall, 07/25/17, Dr. [name] - Post op ileus  3.  CT 12/11:  ? Recurrent bowel perforation, ? Left hepatic abscess, ? RLL penumonia, mild dehiscence of anterior abdominal wound. Exploratory laparotomy, drainage of abdominal abscess and evacuation of pelvic hematoma, RUQ drain, and left lateral drain placement, 07/31/17, Dr. Excell Seltzer (Findings: Subdiaphragmatic and subhepatic abscesses.  Large organizing pelvic hematoma.  Apparent necrotic tissue left lobe of the liver.)    Sepsis/shock -vasopressors off AF - Asystole 07/29/17 - no Code Blue -Cardizem currently off E coliBacteremia - Zosyn Acute respiratory failure - Ventilator per CCM Acute renal failure-CRRT discontinued 07/27/17 Malnutrition - Prealbumin <5 Anemia - transfused 1 Unit PRBC 07/27/17, 07/29/17, and 07/30/17 - 2 PRBC History of heavy alcohol use -sedated on  ventilatior History of tobacco use FEN: TPN/IV fluids [id number] 07/23/17- 07/24/17, Zosyn 07/23/17=>>day9,  anidulafungin 07/23/17 =>>day9 [id number] - Severe anemia Follow-up:Dr. Greer Pickerel  Plan:  Continue support, abx, and see how he does.          LOS: 8 days    Teren Zurcher 07/31/2017 [phone]

## 2017-07-31 NOTE — Progress Notes (Addendum)
PHARMACY - ADULT TOTAL PARENTERAL NUTRITION CONSULT NOTE   Pharmacy Consult for TPN Indication: prolonged ileus  Patient Measurements: Body mass index is 27.71 kg/m. Filed Weights   07/29/17 0500 07/30/17 0307 07/31/17 0500  Weight: 193 lb 9 oz (87.8 kg) 194 lb 14.2 oz (88.4 kg) 193 lb 2 oz (87.6 kg)    HPI: [name] admitted on 12/4 with c/o severe and worsening abdominal distention, pain, nonbloody, nonbilious, non-coffee-ground emesis and lack of bowel movements x3 days.  No significant PMH.  He was found to have perforated small bowel s/p repair in OR, and remains intubated/sedated on pressors postop.  Pharmacy is consulted to dose TPN.  Significant events:  12/4 SB resection, open wound VAC.  NG tube placed 12/5 Started CRRT 12/6 Repeat OR on for washout, ileocecectomy, creation of end ileostomy,closure of abdominal wall 12/9 CRRT off 12/9 am 12/12 Exploratory laparotomy, drainage of abdominal abscess and evacuation of pelvic hematoma, RUQ drain, and left lateral drain placement. Findings per surgery note: Subdiaphragmatic and subhepatic abscesses.Large organizing pelvic hematoma. Apparent necrotic tissue left lobe of the liver.  Insulin requirements past 24 hours: 5 units SSI/24h  Current Nutrition: NPO, TPN at goal rate  IVF: none  Central access: CVC triple lumen placed 12/4 TPN start date: 12/7  ASSESSMENT                                                                                                          Today:   Glucose:  goal < 150 (no hx of DM). Mostly at goal with the exception of 1 CBG this AM at 171. Monitor for now.  Electrolytes:  K trended down to 3.4 with lasix. KCl 10 mEq/50 mL IV x 6 runs ordered this AM by PCCM.  Mg, Phos have been WNL. CorrCa wnl. No electrolytes provided in TPN.  Renal:  SCr 3.14 (off CRRT 12/9a). good UOP past two days.  I/O = - 43260mL/24h, NGT = 275mL  LFTs:  AST <2x ULN; ALT & AlkPhos wnl, Tbili increasing per labs  12/10  TGs:  Elevated, now off propofol (no fat emulsion with parenteral nutrition for 1st week since ICU status): 233 (12/5), 400 (12/6), 281 (12/7), 322 (12/10)  Prealbumin:  9.3 (12/5), 9 (12/10)   NUTRITIONAL GOALS                                                                                             RD recs (12/11): 119g Protein, 2211 Kcal  Based on published guidelines, while the patient meets ICU status the initiation of lipids is being delayed for 7 days or until transition out of ICU. Planned start date for lipids is 12/14.  Goal will be to meet 100% of the patient's protein needs and approximately 80% of their caloric needs.  Clinimix 5/20 @ 100 mL/hr to provide  120grams protein (100% of goal) and 2112 kcal (95% of goal)  Glucose infusion rate will be 3.77 mg/kg/min (Maximum 5 mg/kg/min)   PLAN                                                                                                                         At 1800 today:  Continue Clinimix (NO ELYTES) 5/20 at 14700ml/hr  Monitor for fluid overload - PCCM adjusting diuretics  Watch CBGs with change to product with more Dextrose  HOLD lipids while ICU status. Start lipids 12/14 if appropriate per triglyceride level  Will need to adjust Clinimix once lipids start  TPN to contain standard multivitamins and trace element daily.  IVF per MD.  Continue CBGs and sensitive SSI q6h.   TPN lab panels on Mondays & Thursdays.  Clance BollAmanda Tieler Cournoyer, PharmD, BCPS Pager: [phone] 07/31/2017 9:08 AM

## 2017-07-31 NOTE — Op Note (Signed)
Preoperative Diagnosis: Abdominal sepsis, possible small bowel perforation or colonic staple line leak  Postoprative Diagnosis: Abdominal sepsis, subdiaphragmatic and subhepatic abscesses and large pelvic hematoma  Procedure: Procedure(s): EXPLORATORY LAPAROTOMY drainage of abdominal abcesses and evacuation of pelvic hematoma   Surgeon: Glenna FellowsHoxworth, Mychele Seyller T   Assistants: None  Anesthesia:  General endotracheal anesthesia  Indications: Patient is postoperative day #7 and #5 from initial resection of terminal ileum for ischemia and relook laparotomy with resection of the remainder of the ileum and cecum with stapling of the right colon and end ileostomy.  He has developed increasing white blood count and fever and renal insufficiency.  CT scan was obtained which came back early this morning indicating a large amount of free contrast in the pelvis and an apparent abscess above the left lobe of the liver.  With these findings and after discussion with the patient's sister I recommended proceeding with exploratory laparotomy for possible leak from his colonic stump or small bowel.    Procedure Detail: Patient was brought to the operating room intubated and sedated and was placed in the supine position on the operating table.  He was already on broad-spectrum IV antibiotics.  The ostomy device and abdominal dressings were removed and the abdomen widely sterilely prepped and draped.  Patient timeout was performed and correct procedure verified.  The previous interrupted and running suture material was removed from the abdominal wound and the wound carefully opened with gentle blunt dissection.  Small bowel loops in the pelvis and mid abdomen were exposed.  There was no abnormal appearing fluid in the mid abdomen.  With careful blunt dissection I separated small bowel loops and dissected down into the pelvis.  There I encountered a large organizing pelvic hematoma occupying a large portion of the pelvis and  surrounding small bowel loops.  This was carefully evacuated with blunt dissection and irrigation and suction.  There were fairly dense inflammatory adhesions but I was able to examine a majority of the small bowel and there was no evidence of ischemia or leak from the small bowel.  It was very edematous.  I then carefully dissected up above the transverse colon and in the right subhepatic space and encountered a large abscess within purulent fluid and exudate.  This was drained.  It was not foul-smelling.  Further dissection over toward the right paracolic gutter was very difficult with very dense bloody inflammatory adhesions.  I was able to dissect up over the hepatic flexure in the Morrison's pouch and encountered a second large pocket of thin purulent fluid with exudate which was completely evacuated.  This likely extended down toward the stapled right colon.  Above the left lobe of the liver there was some old hematoma and some necrotic appearing tissue that was carefully debrided but then seemed to extend down into the left lobe of the liver with bleeding tissue and I stop this dissection and this was packed with a dry lap pad.  Identified the omentum and transverse colon which was very edematous but viable.  The remaining right colon was extremely edematous and adherent to surrounding tissue and fixed and I did not feel it was safe to try to mobilize the right colon stump for more careful examination.  I did not see any evidence of feculent material or ongoing leak.  There may have been a possible small leak in the defunctionalized colon staple line resulting in the subdiaphragmatic and subhepatic abscesses but I felt this would be more safely controlled with drains  rather than trying to mobilize the colon which was very difficult to identify.  I suspect that what was identified as contrast in the pelvis on CT scan was actually hematoma and there may not have been an actual leak from the GI tract.  Following  this the pelvis and subhepatic and subdiaphragmatic spaces were thoroughly irrigated with multiple liters of warm saline.  There was no apparent active bleeding.  19 Blake drains were placed in the right upper quadrant with the more medial drain going into the subhepatic spaces on the right and the left and the more lateral drain going into the right paracolic gutter and Morison's pouch.  Following this the midline fascia was closed with running looped #1 PDS begun at either end of the incision along with occasional interrupted deep #1 Novafil sutures.  Sponge needle and instrument counts were correct.  Wound was packed with moist Kerlix and ostomy device applied and the patient was returned to the ICU in critical but stable condition.    Findings: Subdiaphragmatic and subhepatic abscesses.  Large organizing pelvic hematoma.  Apparent necrotic tissue left lobe of the liver.  Estimated Blood Loss:  200 mL         Drains: 19 Blake drains in the subhepatic space and right paracolic gutter  Blood Given: none          Specimens: Culture and sensitivity        Complications:  * No complications entered in OR log *         Disposition: PACU - hemodynamically stable.         Condition: stable

## 2017-07-31 NOTE — Progress Notes (Signed)
eLink Physician-Brief Progress Note Patient Name: Lamar LaundryFrederick Stohr DOB: October 02, 1955 MRN: [id number]   Date of Service  07/31/2017  HPI/Events of Note  Agitation  eICU Interventions  Will order: 1. Versed 2 mg IV Q 2 hours PRN agitation.      Intervention Category Minor Interventions: Agitation / anxiety - evaluation and management  Jarick Harkins Eugene 07/31/2017, 3:37 AM

## 2017-07-31 NOTE — Progress Notes (Signed)
PULMONARY / CRITICAL CARE MEDICINE   Name: Caleb Singh MRN: [id number] DOB: Jul 21, 1956    ADMISSION DATE:  07/23/2017 CONSULTATION DATE: 07/23/2017   CHIEF COMPLAINT: Abdominal pain  Brief  This is an otherwise healthy 61 year old who began having severe abdominal pain on Sunday evening.  Onset was acute and the pain is remained localized to the midline anteriorly.  He has not had any pain radiating through to his back.  He has not had any bowel movement since Sunday morning.  He has had some nausea but no vomiting.  He has had no p.o. intake since that time.  Chest x-ray obtained in the emergency room shows free air.  He denies a history of prior abdominal surgeries.  Denies a history of weight loss or overt blood in the stool. Pertinent to his surgical risk, he has no known history of coronary artery disease, chest pain myocardial infarction, CHF, or arrhythmias.  He does have a very distant history of pulmonary embolism.  He gives no history consistent with chronic lung disease, he reports normal exercise tolerance and no difficulty with unusual dyspnea.  He has never had any significant exposure to anesthesia in the past his only surgical procedure was debridement of a finger on his right hand.   SUBJECTIVE/OVERNIGHT/INTERVAL HX Now postop again, awake, appropriate. VITAL SIGNS: BP 132/61 (BP Location: Right Arm)   Pulse 67   Temp 100.1 F (37.8 C) (Oral)   Resp (!) 24   Ht 5\' 10"  (1.778 m)   Wt 193 lb 2 oz (87.6 kg)   SpO2 100%   BMI 27.71 kg/m   HEMODYNAMICS:    VENTILATOR SETTINGS: Vent Mode: PRVC FiO2 (%):  [30 %-40 %] 30 % Set Rate:  [22 bmp] 22 bmp Vt Set:  [580 mL] 580 mL PEEP:  [5 cmH20] 5 cmH20 Pressure Support:  [15 cmH20] 15 cmH20 Plateau Pressure:  [21 cmH20-27 cmH20] 23 cmH20  INTAKE / OUTPUT:  Intake/Output Summary (Last 24 hours) at 07/31/2017 0910 Last data filed at 07/31/2017 0800 Gross per 24 hour  Intake 2997.53 ml  Output 3135 ml  Net  -137.47 ml     PHYSICAL EXAMINATION: General: This is a 61 year old male patient currently resting comfortably on the vent. HEENT: Normocephalic atraumatic no jugular venous distention orally intubated right IJ catheter unremarkable. Cardiac: Regular rate and rhythm without murmur rub or gallop Pulmonary: Clear to auscultation decreased bases no accessory use Abdomen: Soft, hypoactive bowel sounds, mid abdominal dressing intact.  Ostomy unremarkable.  JP drains draining serous drainage Extremities: Warm, dry, strong pulses.  Decreasing edema. Neuro: Awake, interactive, moves extremities but has generalized weakness   PULMONARY Recent Labs  Lab 07/26/17 0900 07/26/17 1535 07/28/17 0856 07/29/17 0418 07/30/17 0500  PHART 7.441 7.398 7.453* 7.434 7.444  PCO2ART 34.1 38.9 35.0 36.2 34.4  PO2ART 104 94.2 114* 111* 82.7*  HCO3 22.8 23.5 24.1 23.9 23.2  O2SAT 97.8 96.4 98.2 98.3 96.3    CBC Recent Labs  Lab 07/29/17 0457 07/30/17 0425 07/30/17 1032 07/31/17 0745  HGB 7.7* 6.7* 8.7* 7.9*  HCT 22.1* 18.9* 25.1* 23.3*  WBC 24.2* 24.3*  --  29.5*  PLT 133* 235  --  375    COAGULATION Recent Labs  Lab 07/31/17 0745  INR 1.58    CARDIAC  No results for input(s): TROPONINI in the last 168 hours. No results for input(s): PROBNP in the last 168 hours.   CHEMISTRY Recent Labs  Lab 07/26/17 0429  07/27/17 0500 07/27/17 1630 07/28/17  0500 07/29/17 0457 07/30/17 0425 07/31/17 0745  NA  --    < > 136  134* 138 138  137 140 139 140  K  --    < > 4.5  4.4 4.5 4.3  4.3 4.1 3.5 3.4*  CL  --    < > 103  102 103 104  103 105 104 109  CO2  --    < > 24  23 26 26  25 24 25  21*  GLUCOSE  --    < > 138*  136* 105* 122*  126* 141* 124* 209*  BUN  --    < > 52*  52* 46* 52*  53* 68* 91* 87*  CREATININE  --    < > 2.68*  2.71* 2.19* 2.38*  2.43*  2.44* 2.98* 3.70* 3.14*  CALCIUM  --    < > 6.7*  6.5* 6.8* 6.9*  6.9* 7.2* 7.3* 7.4*  MG 2.3  --  2.5*  --  2.4 2.4  2.3  --   PHOS  --    < > 2.9  2.9 2.7 2.5 4.4 3.9  --    < > = values in this interval not displayed.   Estimated Creatinine Clearance: 26.8 mL/min (A) (by C-G formula based on SCr of 3.14 mg/dL (H)).   LIVER Recent Labs  Lab 07/24/17 1235  07/25/17 0355  07/26/17 1608 07/27/17 0500 07/27/17 1630 07/28/17 0500 07/29/17 0457 07/31/17 0745  AST 56*  --  75*  --   --  81*  --  56* 44*  --   ALT 24  --  29  --   --  28  --  27 24  --   ALKPHOS 32*  --  43  --   --  55  --  62 74  --   BILITOT 0.8  --  0.8  --   --  1.1  --  1.4* 1.9*  --   PROT 4.2*  --  4.7*  --   --  4.4*  --  4.7* 5.4*  --   ALBUMIN 1.6*  1.7*   < > 1.8*   < > 1.5* 1.5*  1.5* 1.7* 1.5*  1.5* 1.7*  --   INR  --   --   --   --   --   --   --   --   --  1.58   < > = values in this interval not displayed.     INFECTIOUS Recent Labs  Lab 07/24/17 1232 07/24/17 1721 07/25/17 0355 07/28/17 0500  LATICACIDVEN 3.3* 3.5*  --  1.7  PROCALCITON  --   --  >150.00  --      ENDOCRINE CBG (last 3)  Recent Labs    07/30/17 1746 07/31/17 0039 07/31/17 0609  GLUCAP 121* 140* 171*      IMAGING x48h  - image(s) personally visualized  -   highlighted in bold Ct Abdomen Pelvis Wo Contrast  Result Date: 07/31/2017 CLINICAL DATA:  Acute onset of generalized abdominal pain and fever. Recent ileocecectomy and ileostomy. Hypoxia. EXAM: CT ABDOMEN AND PELVIS WITHOUT CONTRAST TECHNIQUE: Multidetector CT imaging of the abdomen and pelvis was performed following the standard protocol without IV contrast. COMPARISON:  CT of the abdomen and pelvis from 07/23/2017 FINDINGS: Lower chest: Dense consolidation of the right lower lobe raises concern for pneumonia. Small bilateral pleural effusions are noted. The visualized portions of the mediastinum are grossly unremarkable. Hepatobiliary: The  nodular contour of the liver is concerning for hepatic cirrhosis. There is a somewhat unusual heterogeneous appearance to the anterior  aspect of the left hepatic lobe, new from December 4th and concerning for evolving abscess. The gallbladder is grossly unremarkable in appearance, though difficult to fully assess given surrounding ascites. The common bile duct remains normal in caliber. Pancreas: The pancreas is within normal limits. Spleen: There is mild heterogeneity in the periphery of the spleen, more prominent than on the prior study. This is of uncertain significance. Surrounding ascites is noted. Adrenals/Urinary Tract: The adrenal glands are unremarkable in appearance. Scattered hypodense and hyperdense renal cysts are noted bilaterally. Nonspecific perinephric stranding is noted. There is no evidence of hydronephrosis. No renal or ureteral stones are identified. Stomach/Bowel: A large amount of extraluminal contrast is noted within the lower abdomen and pelvis, concerning for recurrent bowel perforation. The site of perforation is difficult to fully characterize, though the highest density of contrast is noted at the right lower quadrant, and this may arise from the ascending colonic anastomosis. The small bowel loops are not well assessed. Diffuse ascites tracks about the upper abdomen. Contrast is seen along the paracolic gutters bilaterally. The stomach is decompressed, with an enteric tube ending at the body of the stomach. Mild residual contrast is seen within the colon. Vascular/Lymphatic: The abdominal aorta is unremarkable in appearance. The inferior vena cava is grossly unremarkable. No retroperitoneal lymphadenopathy is seen. No pelvic sidewall lymphadenopathy is identified. Reproductive: The bladder is decompressed, with a Foley catheter in place. The prostate remains normal in size. Other: There is mild dehiscence of the patient's anterior abdominal surgical site. Musculoskeletal: No acute osseous abnormalities are identified. Facet disease is noted at the lower lumbar spine. The visualized musculature is unremarkable in  appearance. IMPRESSION: 1. Large amount of extraluminal contrast noted tracking throughout the lower abdomen and pelvis, concerning for recurrent bowel perforation. The site of perforation is difficult to characterize, though the highest density contrast is seen at the right lower quadrant. This may arise from the ascending colonic anastomosis. 2. Small-bowel loops are not well assessed due to surrounding contrast and ascites. Diffuse ascites noted tracking about the upper abdomen. 3. New heterogeneous appearance to the anterior aspect of the left hepatic lobe, raising concern for evolving abscess. Mild nonspecific heterogeneous appearance along the periphery of the spleen. 4. Dense consolidation of the right lower lung lobe raises concern for pneumonia. Small bilateral pleural effusions noted. 5. Mild dehiscence of the patient's anterior abdominal surgical site. Critical Value/emergent results were called by telephone at the time of interpretation on 07/31/2017 at 12:32 am to Dr. Vaughan BastaSummer, who verbally acknowledged these results. Electronically Signed   By: Roanna RaiderJeffery  Chang M.D.   On: 07/31/2017 00:48   Dg Chest Port 1 View  Result Date: 07/30/2017 CLINICAL DATA:  Respiratory failure.  Intubated. EXAM: PORTABLE CHEST 1 VIEW COMPARISON:  07/29/2017 FINDINGS: Endotracheal tube is unchanged, positioned just below the clavicular heads and well above the carina. Right jugular and left subclavian catheters terminate over the SVC. Enteric tube courses into the left upper abdomen with tip not imaged. The cardiomediastinal silhouette is unchanged. Veiling opacities remain in both lung bases compatible with pleural effusions. Bibasilar airspace opacity, including dense retrocardiac opacity in the left lower lobe, is unchanged. No pneumothorax is identified. IMPRESSION: Unchanged pleural effusions and bibasilar atelectasis or consolidation. Electronically Signed   By: Sebastian AcheAllen  Grady M.D.   On: 07/30/2017 06:54    ASSESSMENT  / PLAN:  Perforated viscus  involving the distal small bowel s/p exploratory laparotomy with small bowel resection and placement of abdominal wound VAC; no anastomosis made peritonitis w/ E. coli bacteremia (pansensitive); septic shock resolved -felt d/t diverticular disease -back to the OR 12/6 underwent reexploration and creation of an ileostomy and closure of abdominal wall -Back to the OR again on 12/12 for subdiaphragmatic and sub-hepatic abscesses, large organizing pelvic hematoma and necrotic left lobe of liver.  Underwent reexploration, drainage of abdominal abscess, evacuation of pelvic hematoma with drain placement - On 07/31/2017 - continues on TNA Plan Continue n.p.o. status Wound care per surgery Day #9 antibiotics and antifungals; will continue as noted above Wound care per surgery Day #8 Zosyn and antifungal; will wait on stopping antifungal until after CT scan results  fever; with persistent leukocytosis -See above; although white blood cell count remains elevated I suspect some of this is postsurgical currently plan Trend fever curve and CBC  A. fib with rapid ventricular response, complicated by symptomatic bradycardia/prolonged cardiac pause/asystole Suspect bradycardia exacerbated by calcium channel blocker suspect A. fib is exacerbated by weaning and anemia.  He is now back in normal sinus rhythm Plan Continue telemetry monitoring  Ventilator dependence status post abdominal surgery - acute resp failure Awake, now postop, on minimal PEEP/FiO2 Plan Continue daily weaning attempt PAD protocol VAP prevention Daily assessment for extubation, however given complexity of his stay thus far may end up requiring trach will try to determine this over the next 48 hours Repeat chest x-ray in a.m.  Acute kidney injury Hypokalemia -> Now off CRRT since 12/9 continues to have excellent urine output, serum creatinine improving -> He remains 3 L positive Plan Continue  current Lasix dosing Replace potassium Repeat a.m. chemistry  Acute encephalopathy; suspect this is a mix of metabolic encephalopathy  h/o ETOH abuse  Plan Continue Precedex drip, RASS goal 0 As needed fentanyl for pain Supportive care   sepsis related thrombocytopenia - rule out HITT 07/27/17 Anemia - critical illness and some blood loss via CRRT -Received blood on 12/9 with appropriate hemoglobin bump -Hit negative -Surgical note describes large organizing pelvic hematoma on 12/12 -Hemoglobin has drifted down compared with 12/11; platelets continue to normalize Plan We will continue SCDs for another 24 hours If remains stable as well as no evidence of bleeding we will add subcu heparin Repeat CBC in a.m. Transfuse per ICU protocol  Protein calorie malnutrition Plan Continue TPN Diet when okay with surgery  Hyperglycemia Plan Sliding scale insulin   DVT prophylaxis: SCD SUP: PPI bid  Diet: NPO + TNA Activity: Bedrest Disposition :ICU Family: none at bedside all week

## 2017-07-31 NOTE — Anesthesia Preprocedure Evaluation (Signed)
Anesthesia Evaluation  Patient identified by MRN, date of birth, ID band Patient unresponsive    Reviewed: Allergy & Precautions, H&P , NPO status , Patient's Chart, lab work & pertinent test results  History of Anesthesia Complications Negative for: history of anesthetic complications  Airway Mallampati: Intubated       Dental no notable dental hx.    Pulmonary former smoker,  Remains intubated from 12/4 surgery   breath sounds clear to auscultation       Cardiovascular negative cardio ROS Normal cardiovascular exam     Neuro/Psych negative neurological ROS  negative psych ROS   GI/Hepatic Neg liver ROS, S/p lap on 12/4, septic and may have ischemic bowel.  For second lap.   Endo/Other  negative endocrine ROS  Renal/GU ARFRenal diseaseOliguric AKI, severe     Musculoskeletal   Abdominal   Peds  Hematology   Anesthesia Other Findings   Reproductive/Obstetrics                             Anesthesia Physical  Anesthesia Plan  ASA: IV  Anesthesia Plan: General   Post-op Pain Management:    Induction: Intravenous  PONV Risk Score and Plan: 2 and Treatment may vary due to age or medical condition, Ondansetron and Dexamethasone  Airway Management Planned: Oral ETT  Additional Equipment:   Intra-op Plan:   Post-operative Plan: Post-operative intubation/ventilation  Informed Consent: I have reviewed the patients History and Physical, chart, labs and discussed the procedure including the risks, benefits and alternatives for the proposed anesthesia with the patient or authorized representative who has indicated his/her understanding and acceptance.     Plan Discussed with: Anesthesiologist, CRNA and Surgeon  Anesthesia Plan Comments: (  )        Anesthesia Quick Evaluation

## 2017-07-31 NOTE — Progress Notes (Signed)
Patient ID: Caleb Singh, male   DOB: 1955/11/16, 61 y.o.   MRN: [id number] 6 Days Post-Op   Subjective: I was called with results of CT scan completed as below.  Patient is intubated and sedated but is able to respond.  Indicates he can hear me.  Does not really respond to questions about abdominal pain.  Objective: Vital signs in last 24 hours: Temp:  [98.5 F (36.9 C)-101 F (38.3 C)] 100.7 F (38.2 C) (12/12 0000) Pulse Rate:  [28-83] 74 (12/12 0000) Resp:  [22-38] 22 (12/12 0000) BP: (85-154)/(43-87) 127/53 (12/12 0000) SpO2:  [91 %-100 %] 100 % (12/12 0000) FiO2 (%):  [40 %] 40 % (12/11 2346) Weight:  [88.4 kg (194 lb 14.2 oz)] 88.4 kg (194 lb 14.2 oz) (12/11 0307) Last BM Date: (green bile from ostomy)  Intake/Output from previous day: 12/11 0701 - 12/12 0700 In: 1536.4 [I.V.:1486.4; IV Piggyback:50] Out: 2275 [Urine:2175; Emesis/NG output:100] Intake/Output this shift: Total I/O In: 799.5 [I.V.:799.5] Out: 750 [Urine:750]  General appearance: Intubated and sedated.  Does respond to voice.  Does not appear in severe distress. GI: Mildly distended.  Some tenderness particularly in the lower abdomen but no guarding or obvious peritoneal signs.  Ileostomy in right lower quadrant is healthy appearing, somewhat edematous with output.  No ischemia. Incision/Wound: Packed with gauze.  Some serous drainage and minimal necrotic material at the base, generally clean  Lab Results:  Recent Labs    07/29/17 0457 07/30/17 0425 07/30/17 1032  WBC 24.2* 24.3*  --   HGB 7.7* 6.7* 8.7*  HCT 22.1* 18.9* 25.1*  PLT 133* 235  --    BMET Recent Labs    07/29/17 0457 07/30/17 0425  NA 140 139  K 4.1 3.5  CL 105 104  CO2 24 25  GLUCOSE 141* 124*  BUN 68* 91*  CREATININE 2.98* 3.70*  CALCIUM 7.2* 7.3*     Studies/Results: Ct Abdomen Pelvis Wo Contrast  Result Date: 07/31/2017 CLINICAL DATA:  Acute onset of generalized abdominal pain and fever. Recent ileocecectomy and  ileostomy. Hypoxia. EXAM: CT ABDOMEN AND PELVIS WITHOUT CONTRAST TECHNIQUE: Multidetector CT imaging of the abdomen and pelvis was performed following the standard protocol without IV contrast. COMPARISON:  CT of the abdomen and pelvis from 07/23/2017 FINDINGS: Lower chest: Dense consolidation of the right lower lobe raises concern for pneumonia. Small bilateral pleural effusions are noted. The visualized portions of the mediastinum are grossly unremarkable. Hepatobiliary: The nodular contour of the liver is concerning for hepatic cirrhosis. There is a somewhat unusual heterogeneous appearance to the anterior aspect of the left hepatic lobe, new from December 4th and concerning for evolving abscess. The gallbladder is grossly unremarkable in appearance, though difficult to fully assess given surrounding ascites. The common bile duct remains normal in caliber. Pancreas: The pancreas is within normal limits. Spleen: There is mild heterogeneity in the periphery of the spleen, more prominent than on the prior study. This is of uncertain significance. Surrounding ascites is noted. Adrenals/Urinary Tract: The adrenal glands are unremarkable in appearance. Scattered hypodense and hyperdense renal cysts are noted bilaterally. Nonspecific perinephric stranding is noted. There is no evidence of hydronephrosis. No renal or ureteral stones are identified. Stomach/Bowel: A large amount of extraluminal contrast is noted within the lower abdomen and pelvis, concerning for recurrent bowel perforation. The site of perforation is difficult to fully characterize, though the highest density of contrast is noted at the right lower quadrant, and this may arise from the ascending colonic anastomosis.  The small bowel loops are not well assessed. Diffuse ascites tracks about the upper abdomen. Contrast is seen along the paracolic gutters bilaterally. The stomach is decompressed, with an enteric tube ending at the body of the stomach. Mild  residual contrast is seen within the colon. Vascular/Lymphatic: The abdominal aorta is unremarkable in appearance. The inferior vena cava is grossly unremarkable. No retroperitoneal lymphadenopathy is seen. No pelvic sidewall lymphadenopathy is identified. Reproductive: The bladder is decompressed, with a Foley catheter in place. The prostate remains normal in size. Other: There is mild dehiscence of the patient's anterior abdominal surgical site. Musculoskeletal: No acute osseous abnormalities are identified. Facet disease is noted at the lower lumbar spine. The visualized musculature is unremarkable in appearance. IMPRESSION: 1. Large amount of extraluminal contrast noted tracking throughout the lower abdomen and pelvis, concerning for recurrent bowel perforation. The site of perforation is difficult to characterize, though the highest density contrast is seen at the right lower quadrant. This may arise from the ascending colonic anastomosis. 2. Small-bowel loops are not well assessed due to surrounding contrast and ascites. Diffuse ascites noted tracking about the upper abdomen. 3. New heterogeneous appearance to the anterior aspect of the left hepatic lobe, raising concern for evolving abscess. Mild nonspecific heterogeneous appearance along the periphery of the spleen. 4. Dense consolidation of the right lower lung lobe raises concern for pneumonia. Small bilateral pleural effusions noted. 5. Mild dehiscence of the patient's anterior abdominal surgical site. Critical Value/emergent results were called by telephone at the time of interpretation on 07/31/2017 at 12:32 am to Dr. Vaughan BastaSummer, who verbally acknowledged these results. Electronically Signed   By: Roanna RaiderJeffery  Chang M.D.   On: 07/31/2017 00:48   Dg Chest Port 1 View  Result Date: 07/30/2017 CLINICAL DATA:  Respiratory failure.  Intubated. EXAM: PORTABLE CHEST 1 VIEW COMPARISON:  07/29/2017 FINDINGS: Endotracheal tube is unchanged, positioned just below the  clavicular heads and well above the carina. Right jugular and left subclavian catheters terminate over the SVC. Enteric tube courses into the left upper abdomen with tip not imaged. The cardiomediastinal silhouette is unchanged. Veiling opacities remain in both lung bases compatible with pleural effusions. Bibasilar airspace opacity, including dense retrocardiac opacity in the left lower lobe, is unchanged. No pneumothorax is identified. IMPRESSION: Unchanged pleural effusions and bibasilar atelectasis or consolidation. Electronically Signed   By: Sebastian AcheAllen  Grady M.D.   On: 07/30/2017 06:54   Dg Chest Port 1 View  Result Date: 07/29/2017 CLINICAL DATA:  61 year old male postoperative day 5 status post repeat abdominal surgery and bowel resection for sepsis and bowel perforation. Intubated. EXAM: PORTABLE CHEST 1 VIEW COMPARISON:  07/28/2017 and earlier. FINDINGS: Portable AP semi upright view at 0412 hours. Endotracheal tube tip in good position between the clavicles and carina. Enteric tube courses to the abdomen, side hole not included. Stable dual lumen right IJ central line and single lumen left subclavian approach central line. Mediastinal contours remain normal. Veiling and confluent bibasilar pulmonary opacity persists and is greater on the left. No pneumothorax or pulmonary edema. Stable other than perhaps mildly improved left perihilar ventilation since yesterday. IMPRESSION: 1.  Stable lines and tubes. 2. No significant change in bibasilar opacity with left lower lobe collapse or consolidation. 3. No new cardiopulmonary abnormality. Electronically Signed   By: Odessa FlemingH  Hall M.D.   On: 07/29/2017 07:26    Anti-infectives: Anti-infectives (From admission, onward)   Start     Dose/Rate Route Frequency Ordered Stop   07/29/17 0600  piperacillin-tazobactam (ZOSYN) IVPB  3.375 g     3.375 g 12.5 mL/hr over 240 Minutes Intravenous Every 8 hours 07/29/17 0518     07/25/17 2000  vancomycin (VANCOCIN) IVPB 1000  mg/200 mL premix  Status:  Discontinued     1,000 mg 200 mL/hr over 60 Minutes Intravenous Every 48 hours 07/23/17 2042 07/24/17 0956   07/24/17 2200  piperacillin-tazobactam (ZOSYN) 3.375 g in dextrose 5 % 50 mL IVPB  Status:  Discontinued     3.375 g 100 mL/hr over 30 Minutes Intravenous Every 6 hours 07/24/17 2006 07/29/17 0518   07/24/17 2100  anidulafungin (ERAXIS) 100 mg in sodium chloride 0.9 % 100 mL IVPB     100 mg 78 mL/hr over 100 Minutes Intravenous Every 24 hours 07/23/17 2039     07/24/17 0000  anidulafungin (ERAXIS) 100 mg in sodium chloride 0.9 % 100 mL IVPB  Status:  Discontinued     100 mg 78 mL/hr over 100 Minutes Intravenous Every 24 hours 07/23/17 2038 07/23/17 2039   07/23/17 2200  piperacillin-tazobactam (ZOSYN) IVPB 3.375 g  Status:  Discontinued     3.375 g 12.5 mL/hr over 240 Minutes Intravenous Every 8 hours 07/23/17 2043 07/24/17 2006   07/23/17 2100  anidulafungin (ERAXIS) 200 mg in sodium chloride 0.9 % 200 mL IVPB     200 mg 78 mL/hr over 200 Minutes Intravenous  Once 07/23/17 2038 07/24/17 0239   07/23/17 2100  vancomycin (VANCOCIN) 1,500 mg in sodium chloride 0.9 % 500 mL IVPB     1,500 mg 250 mL/hr over 120 Minutes Intravenous  Once 07/23/17 2041 07/23/17 2317   07/23/17 1515  piperacillin-tazobactam (ZOSYN) IVPB 3.375 g     3.375 g 100 mL/hr over 30 Minutes Intravenous  Once 07/23/17 1511 07/23/17 1611      Assessment/Plan: Perforated distal small boweland ischemia of cecum 1.S/Pexploratory laparotomy, small bowel resection without anastomosis, placement of open wound VAC system 07/23/17 Dr. Minerva AreolaEric Wilson/Dr. Claud KelpHaywood Ingram 2. S/p Reexploration of abdomen, ileocecectomy, creation of end ileostomy,closure of abdominal wall, 07/25/17, Dr. Almond LintFaera Byerly  Evidence of continued sepsis with persistent elevated WBC and some worsening of renal function prompting CT scan of the abdomen just resulted.  I discussed this with the radiologist after personally  reviewing the x-rays.  There is a fairly large amount of contrast free in the abdominal cavity.  The right colon now appears decompressed which had contained contrast and the radiologist feels this very likely is from the staple line of the right colon.  This would make sense clinically as his ileostomy appears healthy and there was no evidence of small bowel ischemia at the time of laparotomy.  However, cannot be certain this is not a leak proximal to the ileostomy and there is a large amount of contrast.  There also appears to be a developing abscess around the left hepatic lobe.  I think the best course for the patient is to return to the operating room for exploratory laparotomy and washout and likely further colonic resection with mucous fistula.  I discussed this with the patient's sister.  She understands he is critically ill and outcome is uncertain but agrees with and gives consent for plan for surgery.        LOS: 8 days    Mariella SaaBenjamin T Kalum Minner 07/31/2017

## 2017-07-31 NOTE — Progress Notes (Signed)
[facility] Progress Note  Subjective: BUN/ Creat down 87/ 3.14, restarted on IV lasix.  Good UOP.   Vitals:   07/31/17 1100 07/31/17 1155 07/31/17 1200 07/31/17 1300  BP: (!) 141/55  137/61 134/61  Pulse: 72  73 73  Resp: (!) 25  (!) 26 (!) 27  Temp:   99.7 F (37.6 C)   TempSrc:   Oral   SpO2: 100% 100% 100% 100%  Weight:      Height:        Inpatient medications: . chlorhexidine gluconate (MEDLINE KIT)  15 mL Mouth Rinse BID  . Chlorhexidine Gluconate Cloth  6 each Topical Daily  . folic acid  1 mg Intravenous Daily  . furosemide  40 mg Intravenous Q8H  . insulin aspart  0-9 Units Subcutaneous Q6H  . mouth rinse  15 mL Mouth Rinse QID  . midazolam      . pantoprazole (PROTONIX) IV  40 mg Intravenous Q12H  . sodium chloride flush  10-40 mL Intracatheter Q12H  . thiamine injection  100 mg Intravenous Daily   . anidulafungin Stopped (07/30/17 2302)  . dexmedetomidine    . dexmedetomidine (PRECEDEX) IV infusion 1.2 mcg/kg/hr (07/31/17 1422)  . piperacillin-tazobactam (ZOSYN)  IV 3.375 g (07/31/17 1350)  . potassium chloride 10 mEq (07/31/17 1422)  . TPN (CLINIMIX) Adult without lytes 100 mL/hr at 07/31/17 0600  . TPN (CLINIMIX) Adult without lytes     acetaminophen, fentaNYL (SUBLIMAZE) injection, iopamidol, midazolam, sodium chloride flush  Exam: Intubated, on vent, sedated No jvd Chest coarse BS RRR  Abd distended, open abd wd w dressing, ileostomy in place Ext diffuse 2+ pitting edema Neuro on vent, sedated    Impression/Plan: 1.  Acute oliguric AKI: sp CRRT.  Creat improving on its own.  Will sign off.  2.  Perforated viscus: s/p ileocecectomy 12/6.  + end-ileostomy 3.  Shock: better, off pressors. BP's stable.  4.  Acute hypoxic RF: on the ventilator 5.  Vol excess  Kelly Splinter MD [employer] 07/31/2017, 2:53 PM   Recent Labs  Lab 07/28/17 0500 07/29/17 0457 07/30/17 0425 07/31/17 0745  NA 138  137 140 139 140  K  4.3  4.3 4.1 3.5 3.4*  CL 104  103 105 104 109  CO2 '26  25 24 25 '$ 21*  GLUCOSE 122*  126* 141* 124* 209*  BUN 52*  53* 68* 91* 87*  CREATININE 2.38*  2.43*  2.44* 2.98* 3.70* 3.14*  CALCIUM 6.9*  6.9* 7.2* 7.3* 7.4*  PHOS 2.5 4.4 3.9  --    Recent Labs  Lab 07/27/17 0500 07/27/17 1630 07/28/17 0500 07/29/17 0457  AST 81*  --  56* 44*  ALT 28  --  27 24  ALKPHOS 55  --  62 74  BILITOT 1.1  --  1.4* 1.9*  PROT 4.4*  --  4.7* 5.4*  ALBUMIN 1.5*  1.5* 1.7* 1.5*  1.5* 1.7*   Recent Labs  Lab 07/29/17 0457 07/30/17 0425 07/30/17 1032 07/31/17 0745  WBC 24.2* 24.3*  --  29.5*  NEUTROABS 21.5* 19.0*  --  26.8*  HGB 7.7* 6.7* 8.7* 7.9*  HCT 22.1* 18.9* 25.1* 23.3*  MCV 87.0 85.9  --  87.9  PLT 133* 235  --  375   Iron/TIBC/Ferritin/ %Sat No results found for: IRON, TIBC, FERRITIN, IRONPCTSAT

## 2017-08-01 ENCOUNTER — Encounter (HOSPITAL_COMMUNITY): Payer: Self-pay | Admitting: General Surgery

## 2017-08-01 ENCOUNTER — Inpatient Hospital Stay (HOSPITAL_COMMUNITY): Payer: Non-veteran care

## 2017-08-01 LAB — BASIC METABOLIC PANEL
ANION GAP: 9 (ref 5–15)
BUN: 80 mg/dL — ABNORMAL HIGH (ref 6–20)
CALCIUM: 7.8 mg/dL — AB (ref 8.9–10.3)
CHLORIDE: 113 mmol/L — AB (ref 101–111)
CO2: 22 mmol/L (ref 22–32)
CREATININE: 2.63 mg/dL — AB (ref 0.61–1.24)
GFR calc non Af Amer: 24 mL/min — ABNORMAL LOW (ref >60)
GFR, EST AFRICAN AMERICAN: 28 mL/min — AB (ref >60)
Glucose, Bld: 137 mg/dL — ABNORMAL HIGH (ref 65–99)
Potassium: 4 mmol/L (ref 3.5–5.1)
Sodium: 144 mmol/L (ref 135–145)

## 2017-08-01 LAB — BLOOD GAS, ARTERIAL
Acid-base deficit: 2.6 mmol/L — ABNORMAL HIGH (ref 0.0–2.0)
BICARBONATE: 21.9 mmol/L (ref 20.0–28.0)
Drawn by: 422461
FIO2: 30
LHR: 22 {breaths}/min
O2 Saturation: 97.7 %
PATIENT TEMPERATURE: 99.1
PEEP: 5 cmH2O
VT: 580 mL
pCO2 arterial: 39.7 mmHg (ref 32.0–48.0)
pH, Arterial: 7.362 (ref 7.350–7.450)
pO2, Arterial: 104 mmHg (ref 83.0–108.0)

## 2017-08-01 LAB — COMPREHENSIVE METABOLIC PANEL
ALBUMIN: 1.8 g/dL — AB (ref 3.5–5.0)
ALK PHOS: 76 U/L (ref 38–126)
ALT: 23 U/L (ref 17–63)
AST: 29 U/L (ref 15–41)
Anion gap: 10 (ref 5–15)
BILIRUBIN TOTAL: 2.7 mg/dL — AB (ref 0.3–1.2)
BUN: 83 mg/dL — AB (ref 6–20)
CALCIUM: 7.6 mg/dL — AB (ref 8.9–10.3)
CO2: 22 mmol/L (ref 22–32)
CREATININE: 2.95 mg/dL — AB (ref 0.61–1.24)
Chloride: 110 mmol/L (ref 101–111)
GFR calc Af Amer: 24 mL/min — ABNORMAL LOW (ref >60)
GFR, EST NON AFRICAN AMERICAN: 21 mL/min — AB (ref >60)
GLUCOSE: 205 mg/dL — AB (ref 65–99)
POTASSIUM: 3.6 mmol/L (ref 3.5–5.1)
Sodium: 142 mmol/L (ref 135–145)
TOTAL PROTEIN: 6.3 g/dL — AB (ref 6.5–8.1)

## 2017-08-01 LAB — CBC
HCT: 20.8 % — ABNORMAL LOW (ref 39.0–52.0)
HEMOGLOBIN: 7 g/dL — AB (ref 13.0–17.0)
MCH: 30.2 pg (ref 26.0–34.0)
MCHC: 33.7 g/dL (ref 30.0–36.0)
MCV: 89.7 fL (ref 78.0–100.0)
Platelets: 505 10*3/uL — ABNORMAL HIGH (ref 150–400)
RBC: 2.32 MIL/uL — ABNORMAL LOW (ref 4.22–5.81)
RDW: 15.8 % — ABNORMAL HIGH (ref 11.5–15.5)
WBC: 26.1 10*3/uL — AB (ref 4.0–10.5)

## 2017-08-01 LAB — GLUCOSE, CAPILLARY
GLUCOSE-CAPILLARY: 164 mg/dL — AB (ref 65–99)
GLUCOSE-CAPILLARY: 133 mg/dL — AB (ref 65–99)
Glucose-Capillary: 134 mg/dL — ABNORMAL HIGH (ref 65–99)
Glucose-Capillary: 146 mg/dL — ABNORMAL HIGH (ref 65–99)
Glucose-Capillary: 172 mg/dL — ABNORMAL HIGH (ref 65–99)
Glucose-Capillary: 189 mg/dL — ABNORMAL HIGH (ref 65–99)

## 2017-08-01 LAB — MAGNESIUM: MAGNESIUM: 2.2 mg/dL (ref 1.7–2.4)

## 2017-08-01 LAB — PHOSPHORUS: Phosphorus: 3.1 mg/dL (ref 2.5–4.6)

## 2017-08-01 MED ORDER — INSULIN ASPART 100 UNIT/ML ~~LOC~~ SOLN
0.0000 [IU] | SUBCUTANEOUS | Status: DC
  Administered 2017-08-01: 3 [IU] via SUBCUTANEOUS
  Administered 2017-08-01 – 2017-08-04 (×17): 2 [IU] via SUBCUTANEOUS
  Administered 2017-08-04: 3 [IU] via SUBCUTANEOUS
  Administered 2017-08-04: 2 [IU] via SUBCUTANEOUS
  Administered 2017-08-05 (×2): 3 [IU] via SUBCUTANEOUS
  Administered 2017-08-05 (×3): 2 [IU] via SUBCUTANEOUS
  Administered 2017-08-05: 3 [IU] via SUBCUTANEOUS
  Administered 2017-08-06 – 2017-08-09 (×9): 2 [IU] via SUBCUTANEOUS
  Administered 2017-08-09: 1 [IU] via SUBCUTANEOUS
  Administered 2017-08-10 – 2017-08-11 (×4): 2 [IU] via SUBCUTANEOUS

## 2017-08-01 MED ORDER — TRACE MINERALS CR-CU-MN-SE-ZN 10-1000-500-60 MCG/ML IV SOLN
INTRAVENOUS | Status: AC
  Administered 2017-08-01: 18:00:00 via INTRAVENOUS
  Filled 2017-08-01: qty 1000

## 2017-08-01 MED ORDER — SODIUM CHLORIDE 0.9 % IV SOLN
0.0000 ug/h | INTRAVENOUS | Status: DC
  Administered 2017-08-01: 25 ug/h via INTRAVENOUS
  Filled 2017-08-01 (×2): qty 50

## 2017-08-01 MED ORDER — FUROSEMIDE 10 MG/ML IJ SOLN
40.0000 mg | Freq: Two times a day (BID) | INTRAMUSCULAR | Status: AC
  Administered 2017-08-01 (×2): 40 mg via INTRAVENOUS
  Filled 2017-08-01 (×3): qty 4

## 2017-08-01 MED ORDER — ESMOLOL HCL-SODIUM CHLORIDE 2000 MG/100ML IV SOLN
25.0000 ug/kg/min | INTRAVENOUS | Status: DC
  Administered 2017-08-01 – 2017-08-02 (×2): 25 ug/kg/min via INTRAVENOUS
  Filled 2017-08-01 (×2): qty 100

## 2017-08-01 MED ORDER — CHLORHEXIDINE GLUCONATE 0.12 % MT SOLN
OROMUCOSAL | Status: AC
  Administered 2017-08-01: 15 mL
  Filled 2017-08-01: qty 15

## 2017-08-01 MED ORDER — ALTEPLASE 2 MG IJ SOLR
2.0000 mg | Freq: Once | INTRAMUSCULAR | Status: AC
  Administered 2017-08-01: 2 mg
  Filled 2017-08-01: qty 2

## 2017-08-01 MED ORDER — DILTIAZEM LOAD VIA INFUSION
15.0000 mg | Freq: Once | INTRAVENOUS | Status: DC
  Filled 2017-08-01: qty 15

## 2017-08-01 MED ORDER — POTASSIUM CHLORIDE 10 MEQ/50ML IV SOLN
10.0000 meq | INTRAVENOUS | Status: AC
  Administered 2017-08-01 (×5): 10 meq via INTRAVENOUS
  Filled 2017-08-01 (×5): qty 50

## 2017-08-01 NOTE — Progress Notes (Signed)
PULMONARY / CRITICAL CARE MEDICINE   Name: Caleb Singh MRN: [id number] DOB: 1956/04/24    ADMISSION DATE:  07/23/2017 CONSULTATION DATE: 07/23/2017   CHIEF COMPLAINT: Abdominal pain  Brief  This is an otherwise healthy 61 year old who began having severe abdominal pain on Sunday evening.  Onset was acute and the pain is remained localized to the midline anteriorly.  He has not had any pain radiating through to his back.  He has not had any bowel movement since Sunday morning.  He has had some nausea but no vomiting.  He has had no p.o. intake since that time.  Chest x-ray obtained in the emergency room shows free air.  He denies a history of prior abdominal surgeries.  Denies a history of weight loss or overt blood in the stool. Pertinent to his surgical risk, he has no known history of coronary artery disease, chest pain myocardial infarction, CHF, or arrhythmias.  He does have a very distant history of pulmonary embolism.  He gives no history consistent with chronic lung disease, he reports normal exercise tolerance and no difficulty with unusual dyspnea.  He has never had any significant exposure to anesthesia in the past his only surgical procedure was debridement of a finger on his right hand.   SUBJECTIVE/OVERNIGHT/INTERVAL HX Day 1 postop x2, awake alert we will assess for weaning from ventilator VITAL SIGNS: BP (!) 145/71   Pulse 66   Temp 99.1 F (37.3 C) (Oral)   Resp (!) 22   Ht 5\' 10"  (1.778 m)   Wt 89.3 kg (196 lb 13.9 oz)   SpO2 100%   BMI 28.25 kg/m   HEMODYNAMICS:    VENTILATOR SETTINGS: Vent Mode: PRVC FiO2 (%):  [30 %] 30 % Set Rate:  [22 bmp] 22 bmp Vt Set:  [580 mL] 580 mL PEEP:  [5 cmH20] 5 cmH20 Pressure Support:  [20 cmH20] 20 cmH20 Plateau Pressure:  [23 cmH20-27 cmH20] 25 cmH20  INTAKE / OUTPUT:  Intake/Output Summary (Last 24 hours) at 08/01/2017 0744 Last data filed at 08/01/2017 0600 Gross per 24 hour  Intake 3309.9 ml  Output 4610 ml   Net -1300.1 ml     PHYSICAL EXAMINATION: General: 61 year old male despite sedation follows commands moves all extremities nauseated appropriately HEENT: The tracheal tube connected to ventilator, G-tube to low wall intermittent suction. PSY: Dull effect from sedation Neuro: Follows commands moves all extremities x4 nausea to question of abdominal pain CV: Currently in sinus rhythm with a ventricular rate of 69 with a blood pressure 145/71 PULM: Decreased breath sounds in the bases, chest x-ray shows bilateral atelectasis and effusions GI: Abdominal dressing is dry and intact, colostomy is noted. [name]-Pratt drain right flank with bloody drainage  extremities: warm/dry, 1+ edema  Skin: no rashes or lesions    PULMONARY Recent Labs  Lab 07/26/17 1535 07/28/17 0856 07/29/17 0418 07/30/17 0500 08/01/17 0452  PHART 7.398 7.453* 7.434 7.444 7.362  PCO2ART 38.9 35.0 36.2 34.4 39.7  PO2ART 94.2 114* 111* 82.7* 104  HCO3 23.5 24.1 23.9 23.2 21.9  O2SAT 96.4 98.2 98.3 96.3 97.7    CBC Recent Labs  Lab 07/29/17 0457 07/30/17 0425 07/30/17 1032 07/31/17 0745  HGB 7.7* 6.7* 8.7* 7.9*  HCT 22.1* 18.9* 25.1* 23.3*  WBC 24.2* 24.3*  --  29.5*  PLT 133* 235  --  375    COAGULATION Recent Labs  Lab 07/31/17 0745  INR 1.58    CARDIAC  No results for input(s): TROPONINI in the last 168  hours. No results for input(s): PROBNP in the last 168 hours.   CHEMISTRY Recent Labs  Lab 07/27/17 0500 07/27/17 1630 07/28/17 0500 07/29/17 0457 07/30/17 0425 07/31/17 0745 08/01/17 0334  NA 136  134* 138 138  137 140 139 140 142  K 4.5  4.4 4.5 4.3  4.3 4.1 3.5 3.4* 3.6  CL 103  102 103 104  103 105 104 109 110  CO2 24  23 26 26  25 24 25  21* 22  GLUCOSE 138*  136* 105* 122*  126* 141* 124* 209* 205*  BUN 52*  52* 46* 52*  53* 68* 91* 87* 83*  CREATININE 2.68*  2.71* 2.19* 2.38*  2.43*  2.44* 2.98* 3.70* 3.14* 2.95*  CALCIUM 6.7*  6.5* 6.8* 6.9*  6.9* 7.2*  7.3* 7.4* 7.6*  MG 2.5*  --  2.4 2.4 2.3  --  2.2  PHOS 2.9  2.9 2.7 2.5 4.4 3.9  --  3.1   Estimated Creatinine Clearance: 28.8 mL/min (A) (by C-G formula based on SCr of 2.95 mg/dL (H)).   LIVER Recent Labs  Lab 07/27/17 0500 07/27/17 1630 07/28/17 0500 07/29/17 0457 07/31/17 0745 08/01/17 0334  AST 81*  --  56* 44*  --  29  ALT 28  --  27 24  --  23  ALKPHOS 55  --  62 74  --  76  BILITOT 1.1  --  1.4* 1.9*  --  2.7*  PROT 4.4*  --  4.7* 5.4*  --  6.3*  ALBUMIN 1.5*  1.5* 1.7* 1.5*  1.5* 1.7*  --  1.8*  INR  --   --   --   --  1.58  --      INFECTIOUS Recent Labs  Lab 07/28/17 0500  LATICACIDVEN 1.7     ENDOCRINE CBG (last 3)  Recent Labs    07/31/17 1744 07/31/17 2351 08/01/17 0646  GLUCAP 158* 189* 172*      IMAGING x48h  - image(s) personally visualized  -   highlighted in bold Ct Abdomen Pelvis Wo Contrast  Result Date: 07/31/2017 CLINICAL DATA:  Acute onset of generalized abdominal pain and fever. Recent ileocecectomy and ileostomy. Hypoxia. EXAM: CT ABDOMEN AND PELVIS WITHOUT CONTRAST TECHNIQUE: Multidetector CT imaging of the abdomen and pelvis was performed following the standard protocol without IV contrast. COMPARISON:  CT of the abdomen and pelvis from 07/23/2017 FINDINGS: Lower chest: Dense consolidation of the right lower lobe raises concern for pneumonia. Small bilateral pleural effusions are noted. The visualized portions of the mediastinum are grossly unremarkable. Hepatobiliary: The nodular contour of the liver is concerning for hepatic cirrhosis. There is a somewhat unusual heterogeneous appearance to the anterior aspect of the left hepatic lobe, new from December 4th and concerning for evolving abscess. The gallbladder is grossly unremarkable in appearance, though difficult to fully assess given surrounding ascites. The common bile duct remains normal in caliber. Pancreas: The pancreas is within normal limits. Spleen: There is mild  heterogeneity in the periphery of the spleen, more prominent than on the prior study. This is of uncertain significance. Surrounding ascites is noted. Adrenals/Urinary Tract: The adrenal glands are unremarkable in appearance. Scattered hypodense and hyperdense renal cysts are noted bilaterally. Nonspecific perinephric stranding is noted. There is no evidence of hydronephrosis. No renal or ureteral stones are identified. Stomach/Bowel: A large amount of extraluminal contrast is noted within the lower abdomen and pelvis, concerning for recurrent bowel perforation. The site of perforation is difficult to fully  characterize, though the highest density of contrast is noted at the right lower quadrant, and this may arise from the ascending colonic anastomosis. The small bowel loops are not well assessed. Diffuse ascites tracks about the upper abdomen. Contrast is seen along the paracolic gutters bilaterally. The stomach is decompressed, with an enteric tube ending at the body of the stomach. Mild residual contrast is seen within the colon. Vascular/Lymphatic: The abdominal aorta is unremarkable in appearance. The inferior vena cava is grossly unremarkable. No retroperitoneal lymphadenopathy is seen. No pelvic sidewall lymphadenopathy is identified. Reproductive: The bladder is decompressed, with a Foley catheter in place. The prostate remains normal in size. Other: There is mild dehiscence of the patient's anterior abdominal surgical site. Musculoskeletal: No acute osseous abnormalities are identified. Facet disease is noted at the lower lumbar spine. The visualized musculature is unremarkable in appearance. IMPRESSION: 1. Large amount of extraluminal contrast noted tracking throughout the lower abdomen and pelvis, concerning for recurrent bowel perforation. The site of perforation is difficult to characterize, though the highest density contrast is seen at the right lower quadrant. This may arise from the ascending  colonic anastomosis. 2. Small-bowel loops are not well assessed due to surrounding contrast and ascites. Diffuse ascites noted tracking about the upper abdomen. 3. New heterogeneous appearance to the anterior aspect of the left hepatic lobe, raising concern for evolving abscess. Mild nonspecific heterogeneous appearance along the periphery of the spleen. 4. Dense consolidation of the right lower lung lobe raises concern for pneumonia. Small bilateral pleural effusions noted. 5. Mild dehiscence of the patient's anterior abdominal surgical site. Critical Value/emergent results were called by telephone at the time of interpretation on 07/31/2017 at 12:32 am to Dr. Vaughan BastaSummer, who verbally acknowledged these results. Electronically Signed   By: Roanna RaiderJeffery  Chang M.D.   On: 07/31/2017 00:48   Dg Chest Port 1 View  Result Date: 08/01/2017 CLINICAL DATA:  Respiratory failure. EXAM: PORTABLE CHEST 1 VIEW COMPARISON:  07/30/2017. FINDINGS: Endotracheal tube, NG tube, dual-lumen right IJ catheter, left subclavian line in stable position. Heart size stable. Lung volumes with bibasilar atelectasis. Small bilateral pleural effusions. Chest is unchanged from prior exam. IMPRESSION: 1. Lines and tubes in stable position. 2. Low lung volumes with bibasilar atelectasis. Small bilateral pleural effusions. Chest is unchanged from prior exam . Electronically Signed   By: Maisie Fushomas  Register   On: 08/01/2017 06:51    ASSESSMENT / PLAN:  Perforated viscus involving the distal small bowel s/p exploratory laparotomy with small bowel resection and placement of abdominal wound VAC; no anastomosis made peritonitis w/ E. coli bacteremia (pansensitive); septic shock resolved -felt d/t diverticular disease -back to the OR 12/6 underwent reexploration and creation of an ileostomy and closure of abdominal wall -Back to the OR again on 12/12 for subdiaphragmatic and sub-hepatic abscesses, large organizing pelvic hematoma and necrotic left lobe  of liver.  Underwent reexploration, drainage of abdominal abscess, evacuation of pelvic hematoma with drain placement - On 08/01/2017 - continues on TNA Plan Continue n.p.o. status Wound care per surgery Day #10 antibiotics and antifungals; will continue as noted above Wound care per surgery Day #9 Zosyn and antifungal; will wait on stopping antifungal until after CT scan results  fever; with persistent leukocytosis -See above; although white blood cell count remains elevated I suspect some of this is postsurgical currently plan Trend fever curve and CBC  A. fib with rapid ventricular response, complicated by symptomatic bradycardia/prolonged cardiac pause/asystole Suspect bradycardia exacerbated by calcium channel blocker suspect A. fib  is exacerbated by weaning and anemia.  He is now back in normal sinus rhythm Plan Continue telemetry monitoring Close monitoring with negative I&O.  Ventilator dependence status post abdominal surgery - acute resp failure Awake, now postop, on minimal PEEP/FiO2 Plan Continue daily weaning attempt PAD protocol VAP prevention Daily assessment for extubation, however given complexity of his stay thus far may end up requiring trach will try to determine this over the next 48 hours Repeat chest x-ray daily while intubated 08/01/2017 chest x-ray is adequate, will do spontaneous breathing trial, he is awake and alert despite sedation.  He will most likely be able to attempt extubation 08/01/2017.  The major concern would be mechanics with abdominal laparotomies x2.  Acute kidney injury Hypokalemia  Intake/Output Summary (Last 24 hours) at 08/01/2017 0752 Last data filed at 08/01/2017 0600 Gross per 24 hour  Intake 3309.9 ml  Output 4610 ml  Net -1300.1 ml    Lab Results  Component Value Date   CREATININE 2.95 (H) 08/01/2017   CREATININE 3.14 (H) 07/31/2017   CREATININE 3.70 (H) 07/30/2017   Recent Labs  Lab 07/30/17 0425 07/31/17 0745  08/01/17 0334  K 3.5 3.4* 3.6   -> Now off CRRT since 12/9 continues to have adequate urine output, serum creatinine improving -> -1300 cc over last 24 hours. Plan Continue  Lasix dosing at reduced frequency of every 12 hours x2 doses Replace potassium  Check chemistry.  1800 hrs. 08/02/2007 Repeat a.m. chemistry  Acute encephalopathy; suspect this is a mix of metabolic encephalopathy.  08/01/2017 despite sedation is awake and follows commands h/o ETOH abuse  Plan Continue Precedex drip, RASS goal 0 As needed fentanyl for pain Supportive care Continue thiamine folic acid Current mental state should not prohibit extubation if he meets the criteria   sepsis related thrombocytopenia - rule out HITT 07/27/17 Anemia - critical illness and some blood loss via CRRT Recent Labs    07/30/17 1032 07/31/17 0745  HGB 8.7* 7.9*    -Received blood on 12/9 with appropriate hemoglobin bump -Hit negative -Surgical note describes large organizing pelvic hematoma on 12/12 -Hemoglobin has drifted down compared with 12/11; platelets continue to normalize  Plan We will continue SCDs for another 24 hours If remains stable as well as no evidence of bleeding we will add subcu heparin Repeat CBC in a.m. Transfuse per ICU protocol 08/01/2017 no hemoglobin lab drawn this a.m. we will check hemoglobin.  Protein calorie malnutrition Plan Continue TPN Diet when okay with surgery  Hyperglycemia CBG (last 3)  Recent Labs    07/31/17 1744 07/31/17 2351 08/01/17 0646  GLUCAP 158* 189* 172*     Plan Sliding scale insulin  TPN adjustments with goal to keep glucose less than 150 08/01/2017 conferred with pharmacy about changes in glucose and insulin changes.   DVT prophylaxis: SCD SUP: PPI bid  Diet: NPO + TNA Activity: Bedrest Disposition :ICU Family: none at bedside a   ApP CCT 30 min   Brett CanalesSteve Byanca Kasper ACNP [facility] Pager [phone] till 1 pm If no answer page [phone]-  [phone] 08/01/2017, 7:44 AM

## 2017-08-01 NOTE — Progress Notes (Signed)
PHARMACY - ADULT TOTAL PARENTERAL NUTRITION CONSULT NOTE   Pharmacy Consult for TPN Indication: prolonged ileus  Patient Measurements: Body mass index is 28.25 kg/m. Filed Weights   07/30/17 0307 07/31/17 0500 08/01/17 0413  Weight: 194 lb 14.2 oz (88.4 kg) 193 lb 2 oz (87.6 kg) 196 lb 13.9 oz (89.3 kg)    HPI: 10 YOM admitted on 12/4 with c/o severe and worsening abdominal distention, pain, nonbloody, nonbilious, non-coffee-ground emesis and lack of bowel movements x3 days.  No significant PMH.  He was found to have perforated small bowel s/p repair in OR, and remains intubated/sedated on pressors postop.  Pharmacy is consulted to dose TPN.  Significant events:  12/4 SB resection, open wound VAC.  NG tube placed 12/5 Started CRRT 12/6 Repeat OR on for washout, ileocecectomy, creation of end ileostomy,closure of abdominal wall 12/9 CRRT off 12/9 am 12/12 Exploratory laparotomy, drainage of abdominal abscess and evacuation of pelvic hematoma, RUQ drain, and left lateral drain placement. Findings per surgery note: Subdiaphragmatic and subhepatic abscesses.Large organizing pelvic hematoma. Apparent necrotic tissue left lobe of the liver.  Insulin requirements past 24 hours: 6 units SSI/24h  Current Nutrition: NPO, TPN at goal rate  IVF: none  Central access: CVC triple lumen placed 12/4 TPN start date: 12/7  ASSESSMENT                                                                                                          Today:   Glucose:  goal < 150 (no hx of DM). CBGs 158-198, above goal with switch to 5/20 product. Will adjust SSI. May need to switch back to 5/15 if cannot tolerate extra dextrose.  Electrolytes:  K 3.6, PCCM ordering KCl replacement with lasix. Mg, Phos have been WNL. CorrCa wnl. No electrolytes provided in TPN.  Renal:  SCr 2.95 (off CRRT 12/9a). good UOP with lasix. BUN elevated but trending down.  I/O = -1100 mL/24h, NGT = 120m  LFTs:  AST, ALT, Alk  Phos wnl, Tbili increasing, not jaundiced  TGs:  Elevated, propofol stopped 12/7. No lipids provided from TPN. TGs 233 (12/5), 400 (12/6), 281 (12/7), 322 (12/10)  Prealbumin:  9.3 (12/5), 9 (12/10)   NUTRITIONAL GOALS                                                                                             RD recs (12/11): 119g Protein, 2211 Kcal  Based on published guidelines, while the patient meets ICU status the initiation of lipids is being delayed for 7 days or until transition out of ICU. Planned start date for lipids is 12/14.  Goal will be to meet 100% of the patient's protein  needs and approximately 80% of their caloric needs.  Clinimix 5/20 @ 100 mL/hr to provide  120grams protein (100% of goal) and 2112 kcal (95% of goal). Glucose infusion rate will be 3.73 mg/kg/min (Maximum 5 mg/kg/min)   PLAN                                                                                                                         At 1800 today:  Continue Clinimix (NO ELYTES) 5/20 at 112m/hr  Monitor for fluid overload - PCCM adjusting diuretics  HOLD lipids while ICU status. Start lipids 12/14 if appropriate per triglyceride level  Will need to adjust Clinimix once lipids start  TPN to contain standard multivitamins and trace element daily.  IVF per MD. Off currently.  Change CBGs to q4h checks and adjust to SSI to moderate scale.   TPN lab panels on Mondays & Thursdays.   BMET, Triglycerides ordered for AM.    AHershal Coria PharmD, BCPS Pager: [phone]/13/2018 8:30 AM

## 2017-08-01 NOTE — Progress Notes (Signed)
Afib/flutter with RVR, HR 150s, BP 170/80, repeat BP 120/87; so ordered cardizem gtt but since note mentioning he was having symptommatic bradycardia and pauses with CCB ,I have discontinued cardizem gtt before he got any and instead will order esmolol infusion and continue to monitor in case he gets bradycardic again.  He is post op so will hold off on systemic anticoagulation.

## 2017-08-01 NOTE — Progress Notes (Signed)
1 Day Post-Op    CC:  Small bowel perforation,   Subjective: Sedated but waking up will grip both hands, more than he has done before today.    Objective: Vital signs in last 24 hours: Temp:  [98.5 F (36.9 C)-99.7 F (37.6 C)] 98.5 F (36.9 C) (12/13 0800) Pulse Rate:  [65-73] 66 (12/13 0700) Resp:  [22-32] 22 (12/13 0700) BP: (130-159)/(55-79) 145/71 (12/13 0700) SpO2:  [97 %-100 %] 100 % (12/13 0752) FiO2 (%):  [30 %] 30 % (12/13 0752) Weight:  [89.3 kg (196 lb 13.9 oz)] 89.3 kg (196 lb 13.9 oz) (12/13 0413) Last BM Date: (Ostomy)   3500 IV 4125 urine NG 150 Stool 100 Drain 235 Fever resolving Creatinine is better WBC a little better, H/H 7/20.1 CXR this AM:  1. Lines and tubes in stable position.   Low lung volumes with bibasilar atelectasis. Small bilateral pleural effusions. Chest is unchanged from prior exam .  Intake/Output from previous day: 12/12 0701 - 12/13 0700 In: 3505 [I.V.:3257.5; NG/GT:60; IV Piggyback:187.5] Out: 4610 [Urine:4125; Emesis/NG output:150; Drains:235; Stool:100] Intake/Output this shift: No intake/output data recorded.  General appearance: no distress and waking up some, and following some commands. Resp: clear to auscultation bilaterally and anterior  Cardio: NSR this AM GI: some distension, no BS, some sweat in ostomy bag, ostomy pink and looks fine.  Open wound OK, multiple skin tears.  Open wound OK,   Lab Results:  Recent Labs    07/30/17 0425 07/30/17 1032 07/31/17 0745  WBC 24.3*  --  29.5*  HGB 6.7* 8.7* 7.9*  HCT 18.9* 25.1* 23.3*  PLT 235  --  375    BMET Recent Labs    07/31/17 0745 08/01/17 0334  NA 140 142  K 3.4* 3.6  CL 109 110  CO2 21* 22  GLUCOSE 209* 205*  BUN 87* 83*  CREATININE 3.14* 2.95*  CALCIUM 7.4* 7.6*   PT/INR Recent Labs    07/31/17 0745  LABPROT 18.7*  INR 1.58    Recent Labs  Lab 07/27/17 0500 07/27/17 1630 07/28/17 0500 07/29/17 0457 08/01/17 0334  AST 81*  --  56* 44*  29  ALT 28  --  '27 24 23  '$ ALKPHOS 55  --  62 74 76  BILITOT 1.1  --  1.4* 1.9* 2.7*  PROT 4.4*  --  4.7* 5.4* 6.3*  ALBUMIN 1.5*  1.5* 1.7* 1.5*  1.5* 1.7* 1.8*     Lipase     Component Value Date/Time   LIPASE 47 07/23/2017 1455     Medications: . chlorhexidine gluconate (MEDLINE KIT)  15 mL Mouth Rinse BID  . Chlorhexidine Gluconate Cloth  6 each Topical Daily  . folic acid  1 mg Intravenous Daily  . furosemide  40 mg Intravenous Q12H  . insulin aspart  0-15 Units Subcutaneous Q4H  . mouth rinse  15 mL Mouth Rinse QID  . pantoprazole (PROTONIX) IV  40 mg Intravenous Q12H  . sodium chloride flush  10-40 mL Intracatheter Q12H  . thiamine injection  100 mg Intravenous Daily   . anidulafungin Stopped (07/31/17 2140)  . dexmedetomidine (PRECEDEX) IV infusion 1.2 mcg/kg/hr (08/01/17 0600)  . piperacillin-tazobactam (ZOSYN)  IV 3.375 g (08/01/17 4627)  . potassium chloride 10 mEq (08/01/17 0820)  . TPN (CLINIMIX) Adult without lytes 100 mL/hr at 08/01/17 0600   Anti-infectives (From admission, onward)   Start     Dose/Rate Route Frequency Ordered Stop   07/29/17 0600  piperacillin-tazobactam (ZOSYN)  IVPB 3.375 g     3.375 g 12.5 mL/hr over 240 Minutes Intravenous Every 8 hours 07/29/17 0518     07/25/17 2000  vancomycin (VANCOCIN) IVPB 1000 mg/200 mL premix  Status:  Discontinued     1,000 mg 200 mL/hr over 60 Minutes Intravenous Every 48 hours 07/23/17 2042 07/24/17 0956   07/24/17 2200  piperacillin-tazobactam (ZOSYN) 3.375 g in dextrose 5 % 50 mL IVPB  Status:  Discontinued     3.375 g 100 mL/hr over 30 Minutes Intravenous Every 6 hours 07/24/17 2006 07/29/17 0518   07/24/17 2100  anidulafungin (ERAXIS) 100 mg in sodium chloride 0.9 % 100 mL IVPB     100 mg 78 mL/hr over 100 Minutes Intravenous Every 24 hours 07/23/17 2039     07/24/17 0000  anidulafungin (ERAXIS) 100 mg in sodium chloride 0.9 % 100 mL IVPB  Status:  Discontinued     100 mg 78 mL/hr over 100 Minutes  Intravenous Every 24 hours 07/23/17 2038 07/23/17 2039   07/23/17 2200  piperacillin-tazobactam (ZOSYN) IVPB 3.375 g  Status:  Discontinued     3.375 g 12.5 mL/hr over 240 Minutes Intravenous Every 8 hours 07/23/17 2043 07/24/17 2006   07/23/17 2100  anidulafungin (ERAXIS) 200 mg in sodium chloride 0.9 % 200 mL IVPB     200 mg 78 mL/hr over 200 Minutes Intravenous  Once 07/23/17 2038 07/24/17 0239   07/23/17 2100  vancomycin (VANCOCIN) 1,500 mg in sodium chloride 0.9 % 500 mL IVPB     1,500 mg 250 mL/hr over 120 Minutes Intravenous  Once 07/23/17 2041 07/23/17 2317   07/23/17 1515  piperacillin-tazobactam (ZOSYN) IVPB 3.375 g     3.375 g 100 mL/hr over 30 Minutes Intravenous  Once 07/23/17 1511 07/23/17 1611      Assessment/Plan Perforated distal small boweland ischemia of cecum 1.S/Pexploratory laparotomy, small bowel resection without anastomosis, placement of open wound VAC system 07/23/17 Dr. Randall Hiss Wilson/Dr. Fanny Skates 2. S/p Reexploration of abdomen, ileocecectomy, creation of end ileostomy,closure of abdominal wall, 07/25/17, Dr. [name] - Post op ileus  3.  CT 12/11:  ? Recurrent bowel perforation, ? Left hepatic abscess, ? RLL penumonia, mild dehiscence of anterior abdominal wound. Exploratory laparotomy, drainage of abdominal abscess and evacuation of pelvic hematoma, RUQ drain, and left lateral drain placement, 07/31/17, Dr. Excell Seltzer (Findings: Subdiaphragmatic and subhepatic abscesses.Large organizing pelvic hematoma. Apparent necrotic tissue left lobe of the liver.)    Sepsis/shock -vasopressors off AF - Asystole 07/29/17 - no Code Blue -Cardizem currently off E coliBacteremia - Zosyn Acute respiratory failure - Ventilator per CCM Acute renal failure-CRRT discontinued 07/27/17 Malnutrition - Prealbumin <5 Anemia - transfused 1 Unit PRBC12/8/18,07/29/17, and 07/30/17 - 2 PRBC History of heavy alcohol use -sedated on  ventilatior History of tobacco use FEN: TPN/IV fluids WJ:[id number] 07/23/17- 07/24/17,Zosyn 07/23/17=>>day10, anidulafungin 07/23/17 =>>day10 OZH:YQMV - Severe anemia Follow-up:Dr. Greer Pickerel  Plan:  Continue support, abx, and see how he does. Begin dressing changes.  OOB to chair if possible.  WBC is still up. Will follow this also        LOS: 9 days    Twinkle Sockwell 08/01/2017 [phone]

## 2017-08-01 NOTE — Progress Notes (Signed)
Dr Kendrick FriesMcQuaid called about patient attempt to self extubate. Pt very alert and appears to be oriented and aware of surroundings. Md informed that patient d/c'd NGT and is very agitated and in Afib/Aflutter. MD states he will be over to see patient. Ok to leave OGT out.

## 2017-08-01 NOTE — Progress Notes (Signed)
Nutrition Follow-up  DOCUMENTATION CODES:   Not applicable  INTERVENTION:  - Continue TPN per Pharmacy.   NUTRITION DIAGNOSIS:   Inadequate oral intake related to inability to eat as evidenced by NPO status. -ongoing  GOAL:   Patient will meet greater than or equal to 90% of their needs -met with TPN regimen   MONITOR:   Vent status, Weight trends, Labs, Skin, Other (Comment)(TPN regimen)  ASSESSMENT:   61 y.o. male with no significant past medical history complaining of severely worsening abdominal distention, pain, nonbloody, nonbilious, non-coffee-ground emesis and lack of bowel movements over the course of the last 3 days no BM in 48 hours, feels like he needs to deficate.  He denies prior abdominal surgeries, no fever or chills.  Patient is unsure if he is passing flatus.  He is never had any liver issues he does drink regularly but has never had any seizures or hallucinations when he does not drink, he has not had any alcohol in approximately 1 week.  He is never seen a gastroenterologist.   Surgeries and events: 12/4: perforated viscus involving distal small bowel s/p ex lap with small bowel resection and abdominal wound vac placement; no anastomosis, peritonitis with E. Coli bacteremia. 12/6: re-exploration and creation of ileostomy, closure of abdominal wall 12/7: TPN initiated 12/12: subdiaphragmatic and subhepatic abscesses, large pelvic hematoma aand necrotic L liver lobe; re-exploration, drainage of abdominal abscess, evacuation of pelvic hematoma and drain placement    12/13 No family/visitors have been in pt's room that RD has been able to talk to throughout entirety of hospitalization. Pt remains off CRRT. He had emergent surgery yesterday as outlined above. He remains intubated with NGT to LIS; no drainage in canister, clear yellow liquid throughout tubing. Per chart review, pt +2 lbs/0.9 kg compared to weight on 12/11; suspect this may be d/t surgery yesterday.  Continue to use admission weight (79.4 kg) to re-estimate needs. Increased protein needs d/t yesterday's surgery.   Pt continues with Clinimix E 5/20 @ 100 mL/hr which is providing 120 grams of protein, 2112 kcal. This meets 100% of estimated needs; low end of protein goal. ILE remains on hold per ICU protocol with today being day 7 of hold.   Patient is currently intubated on ventilator support MV: 16.8 L/min Temp (24hrs), Avg:99.1 F (37.3 C), Min:98.5 F (36.9 C), Max:99.7 F (37.6 C) BP: 136/66 and MAP: 85  Medications reviewed; 1 mg IV folic acid/day, 40 mg IV Lasix BID, sliding scale Novolog, 40 mg IV Protonix BID, 10 mEq IV KCl x5 runs today, 100 mg IV thiamine/day.  Labs reviewed; BUN: 83 mg/dL, creatinine: 2.95 mg/dL, Ca: 7.6 mg/dL, GFR: 24 mL/min.   Drip: Precedex @ 1.2 mcg/kg/hr.      12/11 - NGT to LIS with 50cc yellow-green output at this time.  - Pt with triple lumen CVC in L subclavian.  - Receiving Clinimix E 5/15 @ 100 mL/hr (ILE on hold per ICU protocol until 12/14).  - This regimen is providing 120 grams of protein, 1632 kcal.  - Estimated nutrition needs updated this AM and based on current vent settings, admission weight (79.4 kg) as RN assessment indicates moderate and severe edema throughout body, and CRRT was stopped on 12/9.  - Spoke with Pharmacist concerning TPN.  - Plan is to change Clinimix E 5/20 @ 100 mL/hr which will provide 120 grams of protein, 2112 kcal (95% updated kcal need).  - Per Surgery PA note this AM, pt with post-op ileus.  -  Plan for repeat CT scan (abd/pelvis) today to r/o abscess.  - CCM hopeful to keep pt off of CRRT.   Patient is currently intubated on ventilator support MV: 13.7 L/min Temp (24hrs), Avg:100.1 F (37.8 C), Min:98.1 F (36.7 C), Max:101.7 F (38.7 C) Propofol: none BP: 116/49 and MAP: 71 Drip: Precedex @ 0.7 mcg/kg/hr.    12/7 - Pt went back to the OR yesterday and RUQ ileostomy now in place.  - He  remains intubated with NGT to LIS and 100cc out since 4:00 AM today.  - Estimated nutrition needs based on [facility] and needs for pt on CRRT, which was started 12/5 evening.  - Plan to initiate Clinimix 5/15 @ 40 mL/hr tonight; ILE on hold x7 days per ICU protocol.   Patient is currently intubated on ventilator support MV:12.4L/min Temp (24hrs), Avg:98.2 F (36.8 C), Min:97.1 F (36.2 C), Max:98.6 F (37 C) Propofol:4.42m/hr (127 kcal) BP: 97/62 and MAP: 76 Lab; Phos: 4.7 mg/dL Drips: Propofol @ 9 mcg/kg/min, Fentanyl @ 200 mcg/hr, Levo @ 20 mcg/min, Vaso @ 0.3 units/min.     Diet Order:  Diet NPO time specified TPN (CLINIMIX) Adult without lytes TPN (CLINIMIX) Adult without lytes  EDUCATION NEEDS:   No education needs have been identified at this time  Skin:  Skin Assessment: Skin Integrity Issues: Skin Integrity Issues:: Incisions Incisions: Abdominal from surgeries 12/4, 12/6, 12/12  Last BM:  25 mL via ileostomy this AM  Height:   Ht Readings from Last 1 Encounters:  07/25/17 '5\' 10"'$  (1.778 m)    Weight:   Wt Readings from Last 1 Encounters:  08/01/17 196 lb 13.9 oz (89.3 kg)    Ideal Body Weight:  75.45 kg  BMI:  Body mass index is 28.25 kg/m.  Estimated Nutritional Needs:   Kcal:  2123  Protein:  119-135 grams (1.5-1.7 grams/kg)  Fluid:  >2 L/day     JJarome Matin MS, RD, LDN, [facility]arden [facility] CenterInpatient Clinical Dietitian Pager # [phone] hours/weekend pager # [phone]

## 2017-08-01 NOTE — Progress Notes (Signed)
Date: August 01, 2017 Marcelle SmilingRhonda Davis, BSN, OsterdockRN3, [place]CCM [phone] Chart and notes review for patient progress and needs. Remain on full vent support. Will follow for case management and discharge needs. Next review date: [id number]

## 2017-08-02 ENCOUNTER — Inpatient Hospital Stay (HOSPITAL_COMMUNITY): Payer: Non-veteran care

## 2017-08-02 LAB — GLUCOSE, CAPILLARY
GLUCOSE-CAPILLARY: 144 mg/dL — AB (ref 65–99)
Glucose-Capillary: 137 mg/dL — ABNORMAL HIGH (ref 65–99)
Glucose-Capillary: 141 mg/dL — ABNORMAL HIGH (ref 65–99)
Glucose-Capillary: 122 mg/dL — ABNORMAL HIGH (ref 65–99)
Glucose-Capillary: 115 mg/dL — ABNORMAL HIGH (ref 65–99)
Glucose-Capillary: 138 mg/dL — ABNORMAL HIGH (ref 65–99)

## 2017-08-02 LAB — CBC
HEMATOCRIT: 22.6 % — AB (ref 39.0–52.0)
HEMOGLOBIN: 7.5 g/dL — AB (ref 13.0–17.0)
MCH: 30.1 pg (ref 26.0–34.0)
MCHC: 33.2 g/dL (ref 30.0–36.0)
MCV: 90.8 fL (ref 78.0–100.0)
Platelets: 702 10*3/uL — ABNORMAL HIGH (ref 150–400)
RBC: 2.49 MIL/uL — ABNORMAL LOW (ref 4.22–5.81)
RDW: 15.5 % (ref 11.5–15.5)
WBC: 27.5 10*3/uL — ABNORMAL HIGH (ref 4.0–10.5)

## 2017-08-02 LAB — BASIC METABOLIC PANEL
Anion gap: 9 (ref 5–15)
BUN: 77 mg/dL — AB (ref 6–20)
CO2: 22 mmol/L (ref 22–32)
CREATININE: 2.5 mg/dL — AB (ref 0.61–1.24)
Calcium: 7.8 mg/dL — ABNORMAL LOW (ref 8.9–10.3)
Chloride: 114 mmol/L — ABNORMAL HIGH (ref 101–111)
GFR calc Af Amer: 30 mL/min — ABNORMAL LOW (ref >60)
GFR calc non Af Amer: 26 mL/min — ABNORMAL LOW (ref >60)
GLUCOSE: 156 mg/dL — AB (ref 65–99)
Potassium: 3.7 mmol/L (ref 3.5–5.1)
Sodium: 145 mmol/L (ref 135–145)

## 2017-08-02 LAB — PHOSPHORUS: Phosphorus: 2.6 mg/dL (ref 2.5–4.6)

## 2017-08-02 LAB — TRIGLYCERIDES: TRIGLYCERIDES: 231 mg/dL — AB (ref <150)

## 2017-08-02 LAB — MAGNESIUM: Magnesium: 1.9 mg/dL (ref 1.7–2.4)

## 2017-08-02 MED ORDER — MEROPENEM 1 G IV SOLR
1.0000 g | Freq: Two times a day (BID) | INTRAVENOUS | Status: DC
  Administered 2017-08-02 – 2017-08-04 (×6): 1 g via INTRAVENOUS
  Filled 2017-08-02 (×7): qty 1

## 2017-08-02 MED ORDER — FAT EMULSION 20 % IV EMUL
180.0000 mL | INTRAVENOUS | Status: AC
  Administered 2017-08-02: 180 mL via INTRAVENOUS
  Filled 2017-08-02: qty 180

## 2017-08-02 MED ORDER — METOPROLOL TARTRATE 5 MG/5ML IV SOLN
5.0000 mg | Freq: Four times a day (QID) | INTRAVENOUS | Status: DC
  Administered 2017-08-02 – 2017-08-07 (×22): 5 mg via INTRAVENOUS
  Filled 2017-08-02 (×22): qty 5

## 2017-08-02 MED ORDER — HYDRALAZINE HCL 20 MG/ML IJ SOLN
10.0000 mg | INTRAMUSCULAR | Status: DC | PRN
  Administered 2017-08-02 – 2017-08-03 (×3): 10 mg via INTRAVENOUS
  Filled 2017-08-02 (×3): qty 1

## 2017-08-02 MED ORDER — FUROSEMIDE 10 MG/ML IJ SOLN
40.0000 mg | Freq: Two times a day (BID) | INTRAMUSCULAR | Status: AC
  Administered 2017-08-02 (×2): 40 mg via INTRAVENOUS
  Filled 2017-08-02 (×2): qty 4

## 2017-08-02 MED ORDER — FENTANYL CITRATE (PF) 100 MCG/2ML IJ SOLN
50.0000 ug | INTRAMUSCULAR | Status: DC | PRN
  Administered 2017-08-02 – 2017-08-07 (×12): 50 ug via INTRAVENOUS
  Filled 2017-08-02 (×12): qty 2

## 2017-08-02 MED ORDER — CLINIMIX/DEXTROSE (5/15) 5 % IV SOLN: INTRAVENOUS | Status: DC

## 2017-08-02 MED ORDER — TRACE MINERALS CR-CU-MN-SE-ZN 10-1000-500-60 MCG/ML IV SOLN
INTRAVENOUS | Status: AC
  Administered 2017-08-02: 18:00:00 via INTRAVENOUS
  Filled 2017-08-02: qty 1000
  Filled 2017-08-02: qty 2400

## 2017-08-02 MED ORDER — HEPARIN SODIUM (PORCINE) 5000 UNIT/ML IJ SOLN
5000.0000 [IU] | Freq: Three times a day (TID) | INTRAMUSCULAR | Status: DC
  Administered 2017-08-02 – 2017-08-06 (×14): 5000 [IU] via SUBCUTANEOUS
  Filled 2017-08-02 (×14): qty 1

## 2017-08-02 MED ORDER — POTASSIUM CHLORIDE 10 MEQ/50ML IV SOLN
10.0000 meq | INTRAVENOUS | Status: AC
  Administered 2017-08-02 (×5): 10 meq via INTRAVENOUS
  Filled 2017-08-02 (×6): qty 50

## 2017-08-02 MED ORDER — FAT EMULSION 20 % IV EMUL: 180.0000 mL | INTRAVENOUS | Status: DC

## 2017-08-02 MED ORDER — FENTANYL CITRATE (PF) 100 MCG/2ML IJ SOLN
12.5000 ug | INTRAMUSCULAR | Status: DC | PRN
  Administered 2017-08-02 (×2): 12.5 ug via INTRAVENOUS
  Filled 2017-08-02 (×2): qty 2

## 2017-08-02 NOTE — Procedures (Signed)
Extubation Procedure Note  Patient Details:   Name: Lamar LaundryFrederick Tozzi DOB: June 28, 1956 MRN: [id number]   Airway Documentation:     Evaluation  O2 sats: stable throughout Complications: No apparent complications Patient did tolerate procedure well. Bilateral Breath Sounds: Diminished, Rhonchi   Yes  Renold GentaDunlap, Careem Yasui Lawson 08/02/2017, 8:33 AM

## 2017-08-02 NOTE — Consult Note (Signed)
WOC Nurse ostomy consult note Stoma type/location: RUQ end ileostomy; pouch change performed with two piece transparent pouch applied per MD preference.  Patient is off vent as of this am and he is talking non stop until his sats are dropping. Patient encouraged to just rest and not be so verbal at present. Stomal assessment/size:  Shape is oval  :  4.5 cm left to right x 3 cm head to toe  Medical adhesive related skin injury (MARSI) noted to perimeter of adhesive from 2 to 5 o'clock; 6o'clock at base of stoma, extends distally 3 cm; 7 to 10 o'clock.  This area is protected with stoma powder and no sting skin barrier prior to placing new pouch. Peristomal assessment: see above Treatment options for stomal/peristomal skin: barrier ring and stoma powder with skin barrier  Output: 50cc black liquid effluent Ostomy pouching: 2pc. 2 3/4" transparent system with barrier ring.  Stoma powder and skin protectant wipe to [facility]   Education provided: No family present, pt not in condition for education currently. Enrolled patient in [employer] DC program: No We will follow this patient and remain available to this patient, nursing, and the medical and surgical teams.  Barnett HatterMelinda Ignatz Deis, RN-C, WTA-C, [facility] Wound Treatment Associate Ostomy Care Associate

## 2017-08-02 NOTE — Progress Notes (Signed)
Patient becoming more anxious. Abruptly pulled off abdominal dressing. Order received from Dr. Kendrick FriesMcquaid to increase ceiling of Precedex gtt. Dressing replaced.

## 2017-08-02 NOTE — Progress Notes (Signed)
Nutrition Follow-up  DOCUMENTATION CODES:   Not applicable  INTERVENTION:  - Continue TPN per Pharmacy.  - Will continue to monitor medical course.   NUTRITION DIAGNOSIS:   Inadequate oral intake related to inability to eat as evidenced by NPO status. -ongoing  GOAL:   Patient will meet greater than or equal to 90% of their needs -met with TPN regimen.   MONITOR:   Weight trends, Labs, Skin, I & O's  ASSESSMENT:   61 y.o. male with no significant past medical history complaining of severely worsening abdominal distention, pain, nonbloody, nonbilious, non-coffee-ground emesis and lack of bowel movements over the course of the last 3 days no BM in 48 hours, feels like he needs to deficate.  He denies prior abdominal surgeries, no fever or chills.  Patient is unsure if he is passing flatus.  He is never had any liver issues he does drink regularly but has never had any seizures or hallucinations when he does not drink, he has not had any alcohol in approximately 1 week.  He is never seen a gastroenterologist.   Surgeries and events: 12/4: perforated viscus involving distal small bowel s/p ex lap with small bowel resection and abdominal wound vac placement; no anastomosis, peritonitis with E. Coli bacteremia. 12/6: re-exploration and creation of ileostomy, closure of abdominal wall; CRRT initiated 12/7: TPN initiated 12/9: CRRT stopped 12/12: subdiaphragmatic and subhepatic abscesses, large pelvic hematoma aand necrotic L liver lobe; re-exploration, drainage of abdominal abscess, evacuation of pelvic hematoma and drain placement 12/14: extubated    12/14 Pt was extubated at ~8:30 AM today and NGT remains in place. Weight -7 lbs/3.2 kg compared to weight yesterday. Estimated nutrition needs updated based on extubation and based on admission weight (79.4 kg). Per Pharmacy note earlier this AM, plan for Clinimix 5/15 (no lytes) @ 100 mL/hr with 20% ILE @ 15 mL/hr x12 hours. This regimen  will provide 120 grams of protein, 2064 kcal (will meet 100% re-estimated protein and kcal needs).   Medications reviewed; 1 mg IV folic acid/day, 40 mg IV Lasix BID, sliding scale Novolog, 40 mg IV Protonix BID, 10 mEq IV KCl x5 runs today, 100 mg IV thiamine/day. Labs reviewed; CBGs: 137 and 141 mg/dL this AM, Cl: 114 mmol/L, BUN: 77 mg/dL, creatinine: 2.5 mg/dL, Ca: 7.8 mg/dL, GFR: 30 mL/min, triglycerides: 231 mg/dL this AM.     12/13 - Pt remains off CRRT.  - He had emergent surgery yesterday.  - He remains intubated with NGT to LIS; no drainage in canister, clear yellow liquid throughout tubing.  - Per chart review, pt +2 lbs/0.9 kg compared to weight on 12/11; suspect this may be d/t surgery yesterday.  - Pt continues with Clinimix E 5/20 @ 100 mL/hr which is providing 120 grams of protein, 2112 kcal.  - This meets 100% of estimated needs; low end of protein goal. - ILE remains on hold per ICU protocol.   Patient is currently intubated on ventilator support MV: 16.8 L/min Temp (24hrs), Avg:99.1 F (37.3 C), Min:98.5 F (36.9 C), Max:99.7 F (37.6 C) BP: 136/66 and MAP: 85 Medication; 10 mEq IV KCl x5 runs today  Drip: Precedex @ 1.2 mcg/kg/hr.      Diet Order:  Diet NPO time specified TPN (CLINIMIX) Adult without lytes TPN (CLINIMIX) Adult without lytes  EDUCATION NEEDS:   No education needs have been identified at this time  Skin:  Skin Assessment: Skin Integrity Issues: Skin Integrity Issues:: Incisions Incisions: Abdominal from surgeries 12/4, 12/6,  12/12  Last BM:  100 mL via ileostomy this AM  Height:   Ht Readings from Last 1 Encounters:  07/25/17 '5\' 10"'$  (1.778 m)    Weight:   Wt Readings from Last 1 Encounters:  08/02/17 189 lb 13.1 oz (86.1 kg)    Ideal Body Weight:  75.45 kg  BMI:  Body mass index is 27.24 kg/m.  Estimated Nutritional Needs:   Kcal:  9977-4142 (25-28 kcal/kg)  Protein:  105-127 grams (1.3-1.6 grams/kg)  Fluid:  >2  L/day      Jarome Matin, MS, RD, LDN, [facility] Inpatient Clinical Dietitian Pager # [phone] After hours/weekend pager # [phone]

## 2017-08-02 NOTE — Progress Notes (Addendum)
PHARMACY - ADULT TOTAL PARENTERAL NUTRITION CONSULT NOTE   Pharmacy Consult for TPN Indication: prolonged ileus  Patient Measurements: Body mass index is 27.24 kg/m. Filed Weights   07/31/17 0500 08/01/17 0413 08/02/17 0500  Weight: 193 lb 2 oz (87.6 kg) 196 lb 13.9 oz (89.3 kg) 189 lb 13.1 oz (86.1 kg)    HPI: 33 YOM admitted on 12/4 with c/o severe and worsening abdominal distention, pain, nonbloody, nonbilious, non-coffee-ground emesis and lack of bowel movements x3 days.  No significant PMH.  He was found to have perforated small bowel s/p repair in OR, and remains intubated/sedated on pressors postop.  Pharmacy is consulted to dose TPN.  Significant events:  12/4 SB resection, open wound VAC.  NG tube placed 12/5 Started CRRT 12/6 Repeat OR on for washout, ileocecectomy, creation of end ileostomy,closure of abdominal wall 12/9 CRRT off 12/9 am 12/12 Exploratory laparotomy, drainage of abdominal abscess and evacuation of pelvic hematoma, RUQ drain, and left lateral drain placement. Findings per surgery note: Subdiaphragmatic and subhepatic abscesses.Large organizing pelvic hematoma. Apparent necrotic tissue left lobe of the liver.  Insulin requirements past 24 hours: 8 units SSI/24h  Current Nutrition: NPO, TPN at goal rate  IVF: none  Central access: CVC triple lumen placed 12/4 TPN start date: 12/7  ASSESSMENT                                                                                                          Today:   Glucose:  goal < 150 (no hx of DM), CBGs were above goal following surgery and with switch to 5/20 product. SSI adjusted, changing back to 5/15 12/14  Electrolytes:  K, Mg, Phos have been WNL. CorrCa wnl. No electrolytes provided in TPN.  Renal:  SCr trending down (off CRRT 12/9a). good UOP with lasix. BUN elevated but trending down.  I/O = -2100 mL/24h, NGT = 173m, drains =  982m LFTs:  AST, ALT, Alk Phos wnl, Tbili increasing, not  jaundiced  TGs:  Elevated, propofol stopped 12/7. No lipids provided from TPN. TGs 233 (12/5), 400 (12/6), 281 (12/7), 322 (12/10), 231 (12/14)  Prealbumin:  9.3 (12/5), 9 (12/10)  NUTRITIONAL GOALS                                                                                             RD recs (12/11): 119 - 135g Protein, 2123 Kcal  Based on published guidelines, while the patient meets ICU status the initiation of lipids is being delayed for 7 days or until transition out of ICU. Planned start date for lipids is 12/14.  Goal will be to meet 100% of the patient's protein needs and approximately 80%  of their caloric needs.  Clinimix 5/15 @ 100 mL/hr and lipids 20% at 41m/hr over 12h to provide  120 grams protein (100% of goal) and 2064 kcal (97% of goal).  PLAN                                                                                                                         At 1800 today:  Change back to Clinimix (NO ELYTES) 5/15 (from 5/20) at 1052mhr for start of lipids tonight  Monitor for fluid overload - PCCM adjusting diuretics  Start lipids 12/14 (held for first week of ICU status) at 1563mr over 12h (dose reduction due to mildly inc trigs)  TPN to contain standard multivitamins and trace element daily.  IVF per MD. Off currently.  Change CBGs to q4h checks and adjust to SSI to moderate scale.   TPN lab panels on Mondays & Thursdays.   BMET for AM.    DusDoreene ElandharmD, BCPS.   Pager: [phone]/14/2018 8:24 AM

## 2017-08-02 NOTE — Progress Notes (Signed)
25cc of 3410mcg/ml of Fentanyl wasted in sink. Witnessed by Sanda LingerBrianna McNabb, RN.

## 2017-08-02 NOTE — Progress Notes (Signed)
PULMONARY / CRITICAL CARE MEDICINE   Name: Caleb LaundryFrederick Singh MRN: [id number] DOB: 02/07/1956    ADMISSION DATE:  07/23/2017 CONSULTATION DATE: 07/23/2017   CHIEF COMPLAINT: Abdominal pain  Brief  This is an otherwise healthy 61 year old who began having severe abdominal pain on Sunday evening.  Onset was acute and the pain is remained localized to the midline anteriorly.  He has not had any pain radiating through to his back.  He has not had any bowel movement since Sunday morning.  He has had some nausea but no vomiting.  He has had no p.o. intake since that time.  Chest x-ray obtained in the emergency room shows free air.  He denies a history of prior abdominal surgeries.  Denies a history of weight loss or overt blood in the stool. Pertinent to his surgical risk, he has no known history of coronary artery disease, chest pain myocardial infarction, CHF, or arrhythmias.  He does have a very distant history of pulmonary embolism.  He gives no history consistent with chronic lung disease, he reports normal exercise tolerance and no difficulty with unusual dyspnea.  He has never had any significant exposure to anesthesia in the past his only surgical procedure was debridement of a finger on his right hand.   SUBJECTIVE/OVERNIGHT/INTERVAL HX Ready for extubation VITAL SIGNS: BP 120/67   Pulse 83   Temp 99.4 F (37.4 C) (Axillary)   Resp (!) 22   Ht 5\' 10"  (1.778 m)   Wt 189 lb 13.1 oz (86.1 kg)   SpO2 100%   BMI 27.24 kg/m   HEMODYNAMICS:    VENTILATOR SETTINGS: Vent Mode: PSV FiO2 (%):  [30 %] 30 % Set Rate:  [22 bmp] 22 bmp Vt Set:  [580 mL] 580 mL PEEP:  [5 cmH20] 5 cmH20 Pressure Support:  [5 cmH20-15 cmH20] 5 cmH20 Plateau Pressure:  [21 cmH20-22 cmH20] 21 cmH20  INTAKE / OUTPUT:  Intake/Output Summary (Last 24 hours) at 08/02/2017 [id number] Last data filed at 08/02/2017 0600 Gross per 24 hour  Intake 2942.89 ml  Output 5360 ml  Net -2417.11 ml     PHYSICAL  EXAMINATION: General: This is a 61 year old male patient currently awake, following commands, has excellent tidal volume on spontaneous breathing trial. HEENT: Orally intubated, no jugular venous distention, normocephalic, atraumatic, mucous membranes moist. Pulmonary: Clear to auscultation, no accessory muscle use on spontaneous breathing trial. Cardiac: Regular rhythm currently sinus maintained on esmolol infusion Abdomen: Soft, nontender to palpation, stoma pink and unremarkable, mid abdominal dressing clean dry and intact. Extremities/musculoskeletal: Persistent lower extremity edema although improving on serial exams.  Warm to palpation, brisk cap refill, strong pulses. Neuro/psych: Awake, alert, follows commands, no focal motor deficits.   PULMONARY Recent Labs  Lab 07/26/17 1535 07/28/17 0856 07/29/17 0418 07/30/17 0500 08/01/17 0452  PHART 7.398 7.453* 7.434 7.444 7.362  PCO2ART 38.9 35.0 36.2 34.4 39.7  PO2ART 94.2 114* 111* 82.7* 104  HCO3 23.5 24.1 23.9 23.2 21.9  O2SAT 96.4 98.2 98.3 96.3 97.7    CBC Recent Labs  Lab 07/31/17 0745 08/01/17 0325 08/02/17 0502  HGB 7.9* 7.0* 7.5*  HCT 23.3* 20.8* 22.6*  WBC 29.5* 26.1* 27.5*  PLT 375 505* 702*    COAGULATION Recent Labs  Lab 07/31/17 0745  INR 1.58    CARDIAC  No results for input(s): TROPONINI in the last 168 hours. No results for input(s): PROBNP in the last 168 hours.   CHEMISTRY Recent Labs  Lab 07/28/17 0500 07/29/17 0457 07/30/17 0425 07/31/17 0745  08/01/17 0334 08/01/17 1829 08/02/17 0502  NA 138  137 140 139 140 142 144 145  K 4.3  4.3 4.1 3.5 3.4* 3.6 4.0 3.7  CL 104  103 105 104 109 110 113* 114*  CO2 26  25 24 25  21* 22 22 22   GLUCOSE 122*  126* 141* 124* 209* 205* 137* 156*  BUN 52*  53* 68* 91* 87* 83* 80* 77*  CREATININE 2.38*  2.43*  2.44* 2.98* 3.70* 3.14* 2.95* 2.63* 2.50*  CALCIUM 6.9*  6.9* 7.2* 7.3* 7.4* 7.6* 7.8* 7.8*  MG 2.4 2.4 2.3  --  2.2  --  1.9  PHOS  2.5 4.4 3.9  --  3.1  --  2.6   Estimated Creatinine Clearance: 31.2 mL/min (A) (by C-G formula based on SCr of 2.5 mg/dL (H)).   LIVER Recent Labs  Lab 07/27/17 0500 07/27/17 1630 07/28/17 0500 07/29/17 0457 07/31/17 0745 08/01/17 0334  AST 81*  --  56* 44*  --  29  ALT 28  --  27 24  --  23  ALKPHOS 55  --  62 74  --  76  BILITOT 1.1  --  1.4* 1.9*  --  2.7*  PROT 4.4*  --  4.7* 5.4*  --  6.3*  ALBUMIN 1.5*  1.5* 1.7* 1.5*  1.5* 1.7*  --  1.8*  INR  --   --   --   --  1.58  --      INFECTIOUS Recent Labs  Lab 07/28/17 0500  LATICACIDVEN 1.7     ENDOCRINE CBG (last 3)  Recent Labs    08/01/17 2319 08/02/17 0317 08/02/17 0741  GLUCAP 146* 137* 141*      IMAGING x48h  - image(s) personally visualized  -   highlighted in bold Dg Chest Port 1 View  Result Date: 08/02/2017 CLINICAL DATA:  Intubation. EXAM: PORTABLE CHEST 1 VIEW COMPARISON:  08/01/2017. FINDINGS: Interim removal of NG tube. Endotracheal tube in stable position. Left subclavian central line stable position. Heart size normal. Low lung volumes with bibasilar atelectasis. Small bilateral pleural effusions. Similar findings noted on prior exam. No pneumothorax. IMPRESSION: 1. Pole of NG tube. Endotracheal tube left subclavian central line stable position. 2. Low lung volumes with mild bibasilar atelectasis. Small bilateral pleural effusions. No change from prior exam . Electronically Signed   By: Maisie Fushomas  Register   On: 08/02/2017 06:21   Dg Chest Port 1 View  Result Date: 08/01/2017 CLINICAL DATA:  Respiratory failure. EXAM: PORTABLE CHEST 1 VIEW COMPARISON:  07/30/2017. FINDINGS: Endotracheal tube, NG tube, dual-lumen right IJ catheter, left subclavian line in stable position. Heart size stable. Lung volumes with bibasilar atelectasis. Small bilateral pleural effusions. Chest is unchanged from prior exam. IMPRESSION: 1. Lines and tubes in stable position. 2. Low lung volumes with bibasilar atelectasis.  Small bilateral pleural effusions. Chest is unchanged from prior exam . Electronically Signed   By: Maisie Fushomas  Register   On: 08/01/2017 06:51   Culture data Surgical wound culture 12/12:  rare gram-positive rods, moderate gram-negative rods>>> Blood cultures 12/10:>>> Blood cultures 12/4: Pansensitive E coli  Antimicrobial data Zosyn 12/6>>> Anidulafungin 12/6>>>  ASSESSMENT / PLAN:  Perforated viscus involving the distal small bowel s/p exploratory laparotomy with small bowel resection and placement of abdominal wound VAC; no anastomosis made peritonitis w/ E. coli bacteremia (pansensitive); septic shock resolved -felt d/t diverticular disease -back to the OR 12/6 underwent reexploration and creation of an ileostomy and closure of abdominal wall -  Back to the OR again on 12/12 for subdiaphragmatic and sub-hepatic abscesses, large organizing pelvic hematoma and necrotic left lobe of liver.  Underwent reexploration, drainage of abdominal abscess, evacuation of pelvic hematoma with drain placement - On 08/02/2017 - continues on TNA Plan Continue n.p.o. status Wound care per surgery Day #11 antibiotics and antifungals, awaiting surgical wound culture results Drain management per surgery   fever; with persistent leukocytosis Spiked fever last night as high as 102, white blood cell count still about the same plan Trending fever and white blood cell curve  A. fib with rapid ventricular response, complicated by symptomatic bradycardia/prolonged cardiac pause/asystole Suspect bradycardia exacerbated by calcium channel blocker suspect A. fib is exacerbated by weaning and anemia.  He is now back in normal sinus rhythm Plan Continue telemetry monitoring Continue esmolol for now, but will add scheduled Lopressor with plan to wean esmolol off Avoiding calcium channel blocker given cardiac pause associated with this earlier in hospitalization Holding off on systemic full dose  anticoagulation  Ventilator dependence status post abdominal surgery - acute resp failure Awake, now postop, on minimal PEEP/FiO2 -Passed spontaneous breathing trial Plan Extubate to nasal cannula Incentive spirometry Aspiration precautions Wean oxygen for saturations greater than 92%   Acute kidney injury Hypokalemia -> Now off CRRT since 12/9 continues to have adequate urine output, serum creatinine improving -> Serum creatinine has improved, making excellent urine Plan Continue Lasix at current dosing as BUN and creatinine allow Repeat a.m. chemistry Continue strict intake and output Continue renal dose of medication  Acute encephalopathy; suspect this is a mix of metabolic encephalopathy.  08/01/2017 despite sedation is awake and follows commands h/o ETOH abuse  Plan Continuing thiamine and folic acid Discontinue fentanyl drip Change Precedex ceiling to 0.6 with hope to wean this off altogether RASS goal 0 As needed fentanyl for pain only  Mobilize when okay with surgery Delirium interventions including lights on during daytime lights out at night reorientation  sepsis related thrombocytopenia - rule out HITT 07/27/17 Anemia - critical illness and some blood loss via CRRT Hemoglobin stable has not received blood since 12/11 no evidence of acute bleeding Plan Start subcu heparin Trend CBC Transfuse per protocol  Protein calorie malnutrition Plan Continue TPN Diet when okay with surgery  Hyperglycemia Plan Continuing sliding scale insulin   DVT prophylaxis: SCD SUP: PPI bid  Diet: NPO + TNA Activity: Bedrest Disposition :ICU Family: none at bedside a  Summary: Ready for extubation this a.m.  Still has intermittent fever, white blood cell count still remains elevated.  Culture data still pending from surgical wound/abscess obtained on 12/12.  At this point will continue current antimicrobial and antifungal coverage.  Overall clinically he seems to have  improved.  My critical care time 35 minutes  Simonne Martinet ACNP-BC [facility] Pulmonary/Critical Care Pager # [phone] OR # [phone] if no answer

## 2017-08-02 NOTE — Progress Notes (Addendum)
Pharmacy Antibiotic Note  Caleb Singh is a 61 y.o. male admitted on 07/23/2017 with sepsis, IAI, bowel perforation.  Pharmacy has been consulted for Zosyn dosing.  S/p OR on 12/4, 12/7, 12/12. PCCM changed zosyn to meropenem.    Day #11 Zosyn and Eraxis.   Back to OR 12/12a. Abscesses drained and sent for culture.    Abscess cx with GNR, previous Bcx with pan-sensitive e. coli  Patient off CRRT 12/9, SCr elevated but improving with good UOP  Plan:  Continue Zosyn 3.375gm IV q8h (4hr extended infusions) for CrCl>20 ml/min.  Continue Eraxis 100 mg IV q24h per MD  F/u SCr and cultures.  F/u ability to narrow based on abscess culture results (? Unasyn)   Addendum: PCCM wishes to change zosyn to meropenem.  Aware of new culture information revealing pan-susc e. Coli from 12/12 abscess cx.  Feel patient is not improving on zosyn.    Meropenem 1gm IV q12h per current renal fx  Height: 5\' 10"  (177.8 cm) Weight: 189 lb 13.1 oz (86.1 kg) IBW/kg (Calculated) : 73  Temp (24hrs), Avg:99.3 F (37.4 C), Min:98.1 F (36.7 C), Max:102 F (38.9 C)  Recent Labs  Lab 07/28/17 0500  07/29/17 0457 07/30/17 0425 07/31/17 0745 08/01/17 0325 08/01/17 0334 08/01/17 1829 08/02/17 0502  WBC 19.0*   < > 24.2* 24.3* 29.5* 26.1*  --   --  27.5*  CREATININE 2.38*  2.43*  2.44*  --  2.98* 3.70* 3.14*  --  2.95* 2.63* 2.50*  LATICACIDVEN 1.7  --   --   --   --   --   --   --   --    < > = values in this interval not displayed.    Estimated Creatinine Clearance: 31.2 mL/min (A) (by C-G formula based on SCr of 2.5 mg/dL (H)).    No Known Allergies  Antimicrobials this admission: 12/4 Vancomycin >> 12/5 12/4 Zosyn >> 12/14 12/4 Eraxis >> 12/14 meropenem   Dose adjustments this admission:   Microbiology results: 12/4 BCx: 1of 3 bottles GNR (BCID = E.coli, no resistance) PANSENSITIVE 12/10 BCx: ngtd 12/11 UCx: NGF 12/12 abscess cx: e. Coli (pan-susc)  Thank you for allowing  pharmacy to be a part of this patient's care.  Juliette Alcideustin Leiani Enright, PharmD, BCPS.   Pager: [phone] 08/02/2017 9:04 AM

## 2017-08-02 NOTE — Progress Notes (Signed)
2 Days Post-Op    CC: Abdominal pain  Subjective: Extubated with respiratory rate is around 30.  He is a bit dyspneic and he is trying to speak but very difficult to understand.  His open wound has a lot of drainage on it.  Drain #1 was serous yesterday but today is purulent.  Drain to is still bloody.  The ileostomy drainage also appears to be bloody to me.  I am going to check a fecal occult on it as soon as possible.  Objective: Vital signs in last 24 hours: Temp:  [98.1 F (36.7 C)-102 F (38.9 C)] 99.4 F (37.4 C) (12/14 0757) Pulse Rate:  [67-109] 83 (12/14 0407) Resp:  [20-35] 22 (12/14 0407) BP: (105-191)/(50-140) 120/67 (12/14 0600) SpO2:  [90 %-100 %] 100 % (12/14 0812) FiO2 (%):  [30 %] 30 % (12/14 0407) Weight:  [86.1 kg (189 lb 13.1 oz)] 86.1 kg (189 lb 13.1 oz) (12/14 0500) Last BM Date: (Ostomy)  3069 IV 5050 urine NG 100 Drain 90 Stool 120 One temp 102, otherwise afebrile VSS, no other fever noted Creatinine improving WBC still going up  Intake/Output from previous day: 12/13 0701 - 12/14 0700 In: 3069.2 [I.V.:3044.2; IV Piggyback:25] Out: 4076 [Urine:5050; Emesis/NG output:100; Drains:90; Stool:120] Intake/Output this shift: No intake/output data recorded.  General appearance: alert, cooperative and dyspenic after extubation Resp: clear anterior GI: open abdomen with a wet dressing, allot of drainage.  Picture of midline below.  Drainage from drain #1 is purulent this a.m. it was clear and serous yesterday.  Drain #2 is bloody in appearance.  Drainage from colostomy appears bloody will check  Lab Results:  Recent Labs    08/01/17 0325 08/02/17 0502  WBC 26.1* 27.5*  HGB 7.0* 7.5*  HCT 20.8* 22.6*  PLT 505* 702*    BMET Recent Labs    08/01/17 1829 08/02/17 0502  NA 144 145  K 4.0 3.7  CL 113* 114*  CO2 22 22  GLUCOSE 137* 156*  BUN 80* 77*  CREATININE 2.63* 2.50*  CALCIUM 7.8* 7.8*   PT/INR Recent Labs    07/31/17 0745  LABPROT  18.7*  INR 1.58    Recent Labs  Lab 07/27/17 0500 07/27/17 1630 07/28/17 0500 07/29/17 0457 08/01/17 0334  AST 81*  --  56* 44* 29  ALT 28  --  '27 24 23  '$ ALKPHOS 55  --  62 74 76  BILITOT 1.1  --  1.4* 1.9* 2.7*  PROT 4.4*  --  4.7* 5.4* 6.3*  ALBUMIN 1.5*  1.5* 1.7* 1.5*  1.5* 1.7* 1.8*     Lipase     Component Value Date/Time   LIPASE 47 07/23/2017 1455     Medications: . chlorhexidine gluconate (MEDLINE KIT)  15 mL Mouth Rinse BID  . Chlorhexidine Gluconate Cloth  6 each Topical Daily  . folic acid  1 mg Intravenous Daily  . insulin aspart  0-15 Units Subcutaneous Q4H  . mouth rinse  15 mL Mouth Rinse QID  . pantoprazole (PROTONIX) IV  40 mg Intravenous Q12H  . sodium chloride flush  10-40 mL Intracatheter Q12H  . thiamine injection  100 mg Intravenous Daily   Anti-infectives (From admission, onward)   Start     Dose/Rate Route Frequency Ordered Stop   07/29/17 0600  piperacillin-tazobactam (ZOSYN) IVPB 3.375 g     3.375 g 12.5 mL/hr over 240 Minutes Intravenous Every 8 hours 07/29/17 0518     07/25/17 2000  vancomycin (VANCOCIN) IVPB  1000 mg/200 mL premix  Status:  Discontinued     1,000 mg 200 mL/hr over 60 Minutes Intravenous Every 48 hours 07/23/17 2042 07/24/17 0956   07/24/17 2200  piperacillin-tazobactam (ZOSYN) 3.375 g in dextrose 5 % 50 mL IVPB  Status:  Discontinued     3.375 g 100 mL/hr over 30 Minutes Intravenous Every 6 hours 07/24/17 2006 07/29/17 0518   07/24/17 2100  anidulafungin (ERAXIS) 100 mg in sodium chloride 0.9 % 100 mL IVPB     100 mg 78 mL/hr over 100 Minutes Intravenous Every 24 hours 07/23/17 2039     07/24/17 0000  anidulafungin (ERAXIS) 100 mg in sodium chloride 0.9 % 100 mL IVPB  Status:  Discontinued     100 mg 78 mL/hr over 100 Minutes Intravenous Every 24 hours 07/23/17 2038 07/23/17 2039   07/23/17 2200  piperacillin-tazobactam (ZOSYN) IVPB 3.375 g  Status:  Discontinued     3.375 g 12.5 mL/hr over 240 Minutes Intravenous  Every 8 hours 07/23/17 2043 07/24/17 2006   07/23/17 2100  anidulafungin (ERAXIS) 200 mg in sodium chloride 0.9 % 200 mL IVPB     200 mg 78 mL/hr over 200 Minutes Intravenous  Once 07/23/17 2038 07/24/17 0239   07/23/17 2100  vancomycin (VANCOCIN) 1,500 mg in sodium chloride 0.9 % 500 mL IVPB     1,500 mg 250 mL/hr over 120 Minutes Intravenous  Once 07/23/17 2041 07/23/17 2317   07/23/17 1515  piperacillin-tazobactam (ZOSYN) IVPB 3.375 g     3.375 g 100 mL/hr over 30 Minutes Intravenous  Once 07/23/17 1511 07/23/17 1611      Assessment/Plan Perforated distal small boweland ischemia of cecum 1.S/Pexploratory laparotomy, small bowel resection without anastomosis, placement of open wound VAC system 07/23/17 Dr. Randall Hiss Wilson/Dr. Fanny Skates 2. S/p Reexploration of abdomen, ileocecectomy, creation of end ileostomy,closure of abdominal wall, 07/25/17, Dr. [name] - Post op ileus  3. CT 12/11: ? Recurrent bowel perforation, ? Left hepatic abscess, ? RLL penumonia, mild dehiscence of anterior abdominal wound. Exploratory laparotomy, drainage of abdominal abscess and evacuation of pelvic hematoma, RUQ drain, and left lateral drain placement, 07/31/17, Dr. Excell Seltzer (Findings: Subdiaphragmatic and subhepatic abscesses.Large organizing pelvic hematoma. Apparent necrotic tissue left lobe of the liver.)    Sepsis/shock -vasopressors off - WBC rising AF - Asystole 07/29/17 - no Code Blue -Cardizem currently off E coliBacteremia - Zosyn Acute respiratory failure -  Acute renal failure-CRRT discontinued 07/27/17 Malnutrition - Prealbumin <5 Anemia - transfused 1 Unit PRBC12/8/18,07/29/17, and12/11/18 - 2 PRBC History of heavy alcohol use -sedated on ventilatior History of tobacco use FEN: TPN/IV fluids RA:[id number] 07/23/17- 07/24/17,Zosyn 07/23/17=>>day11, anidulafungin 07/23/17 =>>day11 KTG:YBWL - Severe anemia Follow-up:Dr. Greer Pickerel  Plan:   Increase dressing changes, check stool occult, continue antibiotics/TNA.WBC still rising and abscess draining thru JP#1.        LOS: 10 days    Shellie Goettl 08/02/2017 [phone]

## 2017-08-03 DIAGNOSIS — Z932 Ileostomy status: Secondary | ICD-10-CM

## 2017-08-03 LAB — CULTURE, BLOOD (ROUTINE X 2)
CULTURE: NO GROWTH
Culture: NO GROWTH
SPECIAL REQUESTS: ADEQUATE
Special Requests: ADEQUATE

## 2017-08-03 LAB — GLUCOSE, CAPILLARY
GLUCOSE-CAPILLARY: 124 mg/dL — AB (ref 65–99)
GLUCOSE-CAPILLARY: 124 mg/dL — AB (ref 65–99)
Glucose-Capillary: 139 mg/dL — ABNORMAL HIGH (ref 65–99)
Glucose-Capillary: 130 mg/dL — ABNORMAL HIGH (ref 65–99)
Glucose-Capillary: 108 mg/dL — ABNORMAL HIGH (ref 65–99)

## 2017-08-03 LAB — CBC
HCT: 21.9 % — ABNORMAL LOW (ref 39.0–52.0)
Hemoglobin: 7.2 g/dL — ABNORMAL LOW (ref 13.0–17.0)
MCH: 29.5 pg (ref 26.0–34.0)
MCHC: 32.9 g/dL (ref 30.0–36.0)
MCV: 89.8 fL (ref 78.0–100.0)
PLATELETS: 810 10*3/uL — AB (ref 150–400)
RBC: 2.44 MIL/uL — ABNORMAL LOW (ref 4.22–5.81)
RDW: 15.2 % (ref 11.5–15.5)
WBC: 25.6 10*3/uL — ABNORMAL HIGH (ref 4.0–10.5)

## 2017-08-03 LAB — BASIC METABOLIC PANEL
Anion gap: 5 (ref 5–15)
BUN: 61 mg/dL — AB (ref 6–20)
CO2: 24 mmol/L (ref 22–32)
CREATININE: 1.87 mg/dL — AB (ref 0.61–1.24)
Calcium: 7.8 mg/dL — ABNORMAL LOW (ref 8.9–10.3)
Chloride: 117 mmol/L — ABNORMAL HIGH (ref 101–111)
GFR calc Af Amer: 42 mL/min — ABNORMAL LOW (ref >60)
GFR, EST NON AFRICAN AMERICAN: 37 mL/min — AB (ref >60)
GLUCOSE: 149 mg/dL — AB (ref 65–99)
Potassium: 3.2 mmol/L — ABNORMAL LOW (ref 3.5–5.1)
SODIUM: 146 mmol/L — AB (ref 135–145)

## 2017-08-03 MED ORDER — FUROSEMIDE 10 MG/ML IJ SOLN
40.0000 mg | Freq: Two times a day (BID) | INTRAMUSCULAR | Status: AC
  Administered 2017-08-03 (×2): 40 mg via INTRAVENOUS
  Filled 2017-08-03 (×2): qty 4

## 2017-08-03 MED ORDER — POTASSIUM CHLORIDE 10 MEQ/50ML IV SOLN
10.0000 meq | INTRAVENOUS | Status: AC
  Administered 2017-08-03 (×5): 10 meq via INTRAVENOUS
  Filled 2017-08-03 (×5): qty 50

## 2017-08-03 MED ORDER — M.V.I. ADULT IV INJ
INJECTION | INTRAVENOUS | Status: AC
  Administered 2017-08-03: 19:00:00 via INTRAVENOUS
  Filled 2017-08-03: qty 2000

## 2017-08-03 MED ORDER — HYDRALAZINE HCL 20 MG/ML IJ SOLN
20.0000 mg | INTRAMUSCULAR | Status: DC
  Administered 2017-08-03 – 2017-08-07 (×26): 20 mg via INTRAVENOUS
  Filled 2017-08-03 (×25): qty 1

## 2017-08-03 MED ORDER — CHLORHEXIDINE GLUCONATE CLOTH 2 % EX PADS
6.0000 | MEDICATED_PAD | Freq: Every day | CUTANEOUS | Status: DC
  Administered 2017-08-03 – 2017-08-05 (×3): 6 via TOPICAL

## 2017-08-03 MED ORDER — FAT EMULSION 20 % IV EMUL
180.0000 mL | INTRAVENOUS | Status: AC
  Administered 2017-08-03: 180 mL via INTRAVENOUS
  Filled 2017-08-03: qty 250

## 2017-08-03 MED ORDER — SODIUM CHLORIDE 0.9% FLUSH
10.0000 mL | INTRAVENOUS | Status: DC | PRN
  Administered 2017-08-03: 30 mL

## 2017-08-03 MED ORDER — ALTEPLASE 2 MG IJ SOLR
2.0000 mg | Freq: Once | INTRAMUSCULAR | Status: AC
  Administered 2017-08-03: 2 mg

## 2017-08-03 NOTE — Progress Notes (Signed)
PULMONARY / CRITICAL CARE MEDICINE   Name: Caleb Singh MRN: [id number] DOB: [birth year]    ADMISSION DATE:  07/23/2017 CONSULTATION DATE: 07/23/2017   CHIEF COMPLAINT: Abdominal pain  Brief  This is an otherwise healthy 61 year old who began having severe abdominal pain on Sunday evening.  Onset was acute and the pain is remained localized to the midline anteriorly.  He has not had any pain radiating through to his back.  He has not had any bowel movement since Sunday morning.  He has had some nausea but no vomiting.  He has had no p.o. intake since that time.  Chest x-ray obtained in the emergency room shows free air.  He denies a history of prior abdominal surgeries.  Denies a history of weight loss or overt blood in the stool. Pertinent to his surgical risk, he has no known history of coronary artery disease, chest pain myocardial infarction, CHF, or arrhythmias.  He does have a very distant history of pulmonary embolism.  He gives no history consistent with chronic lung disease, he reports normal exercise tolerance and no difficulty with unusual dyspnea.  He has never had any significant exposure to anesthesia in the past his only surgical procedure was debridement of a finger on his right hand.   SUBJECTIVE/OVERNIGHT/INTERVAL HX: extubated. Remains extubated. Feels weak.   VITAL SIGNS: BP (!) 174/75   Pulse 66   Temp 98.2 F (36.8 C) (Oral)   Resp (!) 26   Ht 5\' 10"  (1.778 m)   Wt 77.7 kg (171 lb 4.8 oz)   SpO2 100%   BMI 24.58 kg/m   HEMODYNAMICS:    VENTILATOR SETTINGS:    INTAKE / OUTPUT:  Intake/Output Summary (Last 24 hours) at 08/03/2017 1538 Last data filed at 08/03/2017 1328 Gross per 24 hour  Intake 3200 ml  Output 6775 ml  Net -3575 ml     PHYSICAL EXAMINATION:  General: Chronically ill appearing male in NAD HEENT: [place]/At, PERRL muscular and temporal wasting, no JVD Pulmonary: Clear to auscultation, no accessory muscle use. Tachypneic, but just got  back in bed.  Cardiac: Regular rhythm currently sinus Abdomen: Soft, nontender to palpation, stoma pink and unremarkable, mid abdominal dressing clean dry and intact. Extremities/musculoskeletal: trace lower extremity edema.  Neuro/psych: Awake, alert, follows commands, no focal motor deficits.   PULMONARY Recent Labs  Lab 07/28/17 0856 07/29/17 0418 07/30/17 0500 08/01/17 0452  PHART 7.453* 7.434 7.444 7.362  PCO2ART 35.0 36.2 34.4 39.7  PO2ART 114* 111* 82.7* 104  HCO3 24.1 23.9 23.2 21.9  O2SAT 98.2 98.3 96.3 97.7    CBC Recent Labs  Lab 08/01/17 0325 08/02/17 0502 08/03/17 1140  HGB 7.0* 7.5* 7.2*  HCT 20.8* 22.6* 21.9*  WBC 26.1* 27.5* 25.6*  PLT 505* 702* 810*    COAGULATION Recent Labs  Lab 07/31/17 0745  INR 1.58    CARDIAC  No results for input(s): TROPONINI in the last 168 hours. No results for input(s): PROBNP in the last 168 hours.   CHEMISTRY Recent Labs  Lab 07/28/17 0500 07/29/17 0457 07/30/17 0425 07/31/17 0745 08/01/17 0334 08/01/17 1829 08/02/17 0502 08/03/17 1140  NA 138  137 140 139 140 142 144 145 146*  K 4.3  4.3 4.1 3.5 3.4* 3.6 4.0 3.7 3.2*  CL 104  103 105 104 109 110 113* 114* 117*  CO2 26  25 24 25  21* 22 22 22 24   GLUCOSE 122*  126* 141* 124* 209* 205* 137* 156* 149*  BUN 52*  53*  68* 91* 87* 83* 80* 77* 61*  CREATININE 2.38*  2.43*  2.44* 2.98* 3.70* 3.14* 2.95* 2.63* 2.50* 1.87*  CALCIUM 6.9*  6.9* 7.2* 7.3* 7.4* 7.6* 7.8* 7.8* 7.8*  MG 2.4 2.4 2.3  --  2.2  --  1.9  --   PHOS 2.5 4.4 3.9  --  3.1  --  2.6  --    Estimated Creatinine Clearance: 41.7 mL/min (A) (by C-G formula based on SCr of 1.87 mg/dL (H)).   LIVER Recent Labs  Lab 07/27/17 1630 07/28/17 0500 07/29/17 0457 07/31/17 0745 08/01/17 0334  AST  --  56* 44*  --  29  ALT  --  27 24  --  23  ALKPHOS  --  62 74  --  76  BILITOT  --  1.4* 1.9*  --  2.7*  PROT  --  4.7* 5.4*  --  6.3*  ALBUMIN 1.7* 1.5*  1.5* 1.7*  --  1.8*  INR  --   --    --  1.58  --      INFECTIOUS Recent Labs  Lab 07/28/17 0500  LATICACIDVEN 1.7     ENDOCRINE CBG (last 3)  Recent Labs    08/02/17 2329 08/03/17 0349 08/03/17 0735  GLUCAP 138* 139* 124*      IMAGING x48h  - image(s) personally visualized  -   highlighted in bold Dg Chest Port 1 View  Result Date: 08/02/2017 CLINICAL DATA:  Intubation. EXAM: PORTABLE CHEST 1 VIEW COMPARISON:  08/01/2017. FINDINGS: Interim removal of NG tube. Endotracheal tube in stable position. Left subclavian central line stable position. Heart size normal. Low lung volumes with bibasilar atelectasis. Small bilateral pleural effusions. Similar findings noted on prior exam. No pneumothorax. IMPRESSION: 1. Pole of NG tube. Endotracheal tube left subclavian central line stable position. 2. Low lung volumes with mild bibasilar atelectasis. Small bilateral pleural effusions. No change from prior exam . Electronically Signed   By: Maisie Fushomas  Register   On: 08/02/2017 06:21   Culture data Surgical wound culture 12/12:  Pan sensitive E. Coli Blood cultures 12/10:>>> Blood cultures 12/4: Pansensitive E coli  Antimicrobial data Zosyn 12/6>>> Anidulafungin 12/6>>>  ASSESSMENT / PLAN:  Perforated viscus involving the distal small bowel s/p exploratory laparotomy with small bowel resection and placement of abdominal wound VAC; no anastomosis made peritonitis w/ E. coli bacteremia (pansensitive); septic shock resolved -felt d/t diverticular disease -back to the OR 12/6 underwent reexploration and creation of an ileostomy and closure of abdominal wall -Back to the OR again on 12/12 for subdiaphragmatic and sub-hepatic abscesses, large organizing pelvic hematoma and necrotic left lobe of liver.  Underwent reexploration, drainage of abdominal abscess, evacuation of pelvic hematoma with drain placement - On 08/03/2017 -  continues on TNA - diet advanced to allow for ice chips.  Plan Diet ICE chips per surgery Wound  care per surgery Day #12 antibiotics and antifungals, awaiting surgical wound culture results Difficulty with current TPN line, will place order for PICC  fever; with persistent leukocytosis. > febrile again last night, wbc elevated, but stable.  plan Trending fever and white blood cell curve Remains meropenem and eraxis as above  A. fib with rapid ventricular response, complicated by symptomatic bradycardia/prolonged cardiac pause/asystole > Remains sinus Plan Continue telemetry monitoring Esmolol DC but remains hypertensive. Will increase metoprolol dosing.  Avoiding calcium channel blocker given cardiac pause associated with this earlier in hospitalization Holding off on systemic full dose anticoagulation given multiple surgeries.  Ventilator dependence  status post abdominal surgery - acute resp failure >Extubated 12/14 Plan Nasal cannula Incentive spirometry Aspiration precautions Wean oxygen for saturations greater than 92%  Acute kidney injury Hypokalemia > Now off CRRT since 12/9 continues to have adequate urine output, serum creatinine improving Plan Continue Lasix at current dosing as BUN and creatinine allow Daily BMP I&O  Acute encephalopathy; suspect this is a mix of metabolic encephalopathy.  08/01/2017 despite sedation is awake and follows commands h/o ETOH abuse  Plan Continuing thiamine and folic acid Precedex remains at 1, have requested RN to wean this today.  RASS goal 0 As needed fentanyl for pain only  Mobilize when okay with surgery Delirium interventions including lights on during daytime lights out at night reorientation  sepsis related thrombocytopenia - rule out HITT 07/27/17 Anemia - critical illness and some blood loss via CRRT Hemoglobin stable has not received blood since 12/11 no evidence of acute bleeding Plan Subcu heparin Trend CBC Transfuse per protocol  Protein calorie malnutrition Plan Continue TPN Diet when okay with  surgery  Hyperglycemia Plan Continuing sliding scale insulin  DVT prophylaxis: SCD and sub q heparin SUP: PPI bid  Diet: ice chips  + TNA Activity: OOB to chair as tolerated Disposition :ICU Family: sister at bedside updated.   Summary: Tolerating extubation. Respiratory status remains somewhat tenuous. Advancing diet to ice chips. Continuing TPN. Scheduling hydralazine for persistent hypertension. PT eval.   Joneen RoachPaul Yandel Zeiner, AGACNP-BC [facility] Pulmon[facility]/Critical Care Pager [phone] or [phone]  08/03/2017 4:15 PM

## 2017-08-03 NOTE — Progress Notes (Signed)
3 Days Post-Op   Subjective/Chief Complaint: Complains of dry mouth. Wants food   Objective: Vital signs in last 24 hours: Temp:  [97.4 F (36.3 C)-101.7 F (38.7 C)] 97.6 F (36.4 C) (12/15 0421) Pulse Rate:  [68-130] 75 (12/15 0600) Resp:  [24-38] 33 (12/15 0600) BP: (101-182)/(51-88) 162/66 (12/15 0600) SpO2:  [90 %-100 %] 100 % (12/15 0600) Weight:  [77.7 kg (171 lb 4.8 oz)-81.1 kg (178 lb 12.7 oz)] 77.7 kg (171 lb 4.8 oz) (12/15 0421) Last BM Date: (Ostomy)  Intake/Output from previous day: 12/14 0701 - 12/15 0700 In: 2896.4 [I.V.:2316.4; IV Piggyback:580] Out: 6170 [Urine:4750; Drains:145; Stool:1275] Intake/Output this shift: Total I/O In: 303.1 [I.V.:303.1] Out: 500 [Urine:500]  General appearance: alert and cooperative Resp: diminished breath sounds bilaterally Cardio: regular rate and rhythm GI: soft, mild tenderness. open wound clean. ostomy pink and productive  Lab Results:  Recent Labs    08/01/17 0325 08/02/17 0502  WBC 26.1* 27.5*  HGB 7.0* 7.5*  HCT 20.8* 22.6*  PLT 505* 702*   BMET Recent Labs    08/01/17 1829 08/02/17 0502  NA 144 145  K 4.0 3.7  CL 113* 114*  CO2 22 22  GLUCOSE 137* 156*  BUN 80* 77*  CREATININE 2.63* 2.50*  CALCIUM 7.8* 7.8*   PT/INR No results for input(s): LABPROT, INR in the last 72 hours. ABG Recent Labs    08/01/17 0452  PHART 7.362  HCO3 21.9    Studies/Results: Dg Chest Port 1 View  Result Date: 08/02/2017 CLINICAL DATA:  Intubation. EXAM: PORTABLE CHEST 1 VIEW COMPARISON:  08/01/2017. FINDINGS: Interim removal of NG tube. Endotracheal tube in stable position. Left subclavian central line stable position. Heart size normal. Low lung volumes with bibasilar atelectasis. Small bilateral pleural effusions. Similar findings noted on prior exam. No pneumothorax. IMPRESSION: 1. Pole of NG tube. Endotracheal tube left subclavian central line stable position. 2. Low lung volumes with mild bibasilar atelectasis.  Small bilateral pleural effusions. No change from prior exam . Electronically Signed   By: Maisie Fushomas  Register   On: 08/02/2017 06:21    Anti-infectives: Anti-infectives (From admission, onward)   Start     Dose/Rate Route Frequency Ordered Stop   08/02/17 1400  meropenem (MERREM) 1 g in sodium chloride 0.9 % 100 mL IVPB     1 g 200 mL/hr over 30 Minutes Intravenous Every 12 hours 08/02/17 1111     07/29/17 0600  piperacillin-tazobactam (ZOSYN) IVPB 3.375 g  Status:  Discontinued     3.375 g 12.5 mL/hr over 240 Minutes Intravenous Every 8 hours 07/29/17 0518 08/02/17 1036   07/25/17 2000  vancomycin (VANCOCIN) IVPB 1000 mg/200 mL premix  Status:  Discontinued     1,000 mg 200 mL/hr over 60 Minutes Intravenous Every 48 hours 07/23/17 2042 07/24/17 0956   07/24/17 2200  piperacillin-tazobactam (ZOSYN) 3.375 g in dextrose 5 % 50 mL IVPB  Status:  Discontinued     3.375 g 100 mL/hr over 30 Minutes Intravenous Every 6 hours 07/24/17 2006 07/29/17 0518   07/24/17 2100  anidulafungin (ERAXIS) 100 mg in sodium chloride 0.9 % 100 mL IVPB     100 mg 78 mL/hr over 100 Minutes Intravenous Every 24 hours 07/23/17 2039     07/24/17 0000  anidulafungin (ERAXIS) 100 mg in sodium chloride 0.9 % 100 mL IVPB  Status:  Discontinued     100 mg 78 mL/hr over 100 Minutes Intravenous Every 24 hours 07/23/17 2038 07/23/17 2039   07/23/17 2200  piperacillin-tazobactam (ZOSYN) IVPB 3.375 g  Status:  Discontinued     3.375 g 12.5 mL/hr over 240 Minutes Intravenous Every 8 hours 07/23/17 2043 07/24/17 2006   07/23/17 2100  anidulafungin (ERAXIS) 200 mg in sodium chloride 0.9 % 200 mL IVPB     200 mg 78 mL/hr over 200 Minutes Intravenous  Once 07/23/17 2038 07/24/17 0239   07/23/17 2100  vancomycin (VANCOCIN) 1,500 mg in sodium chloride 0.9 % 500 mL IVPB     1,500 mg 250 mL/hr over 120 Minutes Intravenous  Once 07/23/17 2041 07/23/17 2317   07/23/17 1515  piperacillin-tazobactam (ZOSYN) IVPB 3.375 g     3.375  g 100 mL/hr over 30 Minutes Intravenous  Once 07/23/17 1511 07/23/17 1611      Assessment/Plan: s/p Procedure(s): EXPLORATORY LAPAROTOMY drainage of abdominal abcess (N/A) Advance diet. Allow ice chips today since his ostomy is working Continue eraxis and merepenem Continue dressing changes Perforated distal small boweland ischemia of cecum 1.S/Pexploratory laparotomy, small bowel resection without anastomosis, placement of open wound VAC system 07/23/17 Dr. Minerva AreolaEric Wilson/Dr. Claud KelpHaywood Ingram 2. S/p Reexploration of abdomen, ileocecectomy, creation of end ileostomy,closure of abdominal wall, 07/25/17, Dr. Almond LintFaera Byerly - Post op ileus  3. CT 12/11: ? Recurrent bowel perforation, ? Left hepatic abscess, ? RLL penumonia, mild dehiscence of anterior abdominal wound. Exploratory laparotomy, drainage of abdominal abscess and evacuation of pelvic hematoma, RUQ drain, and left lateral drain placement, 07/31/17, Dr. Glenna FellowsBenjamin Hoxworth (Findings: Subdiaphragmatic and subhepatic abscesses.Large organizing pelvic hematoma. Apparent necrotic tissue left lobe of the liver.)    Sepsis/shock -vasopressors off AF - Asystole 07/29/17 - no Code Blue -Cardizem currently off E coliBacteremia - Zosyn Acute respiratory failure - Ventilator per CCM Acute renal failure-CRRT discontinued 07/27/17 Malnutrition - Prealbumin <5 Anemia - transfused 1 Unit PRBC12/8/18,07/29/17, and12/11/18 - 2 PRBC History of heavy alcohol use -sedated on ventilatior History of tobacco use FEN: TPN/IV fluids WU:JWJXBJY:merepen and eraxis NWG:NFAOVT:SCDs - Severe anemia Follow-up:Dr. Gaynelle AduEric Wilson  Plan: Continue support, abx, and see how he does. Begin dressing changes.  OOB to chair if possible.  WBC is still up. Will follow this also    LOS: 11 days    TOTH III,Orry Sigl S 08/03/2017

## 2017-08-03 NOTE — Progress Notes (Addendum)
PHARMACY - ADULT TOTAL PARENTERAL NUTRITION CONSULT NOTE   Pharmacy Consult for TPN Indication: prolonged ileus  Patient Measurements: Body mass index is 24.58 kg/m. Filed Weights   08/02/17 0500 08/02/17 1123 08/03/17 0421  Weight: 189 lb 13.1 oz (86.1 kg) 178 lb 12.7 oz (81.1 kg) 171 lb 4.8 oz (77.7 kg)    HPI: 63 YOM admitted on 12/4 with c/o severe and worsening abdominal distention, pain, nonbloody, nonbilious, non-coffee-ground emesis and lack of bowel movements x3 days.  No significant PMH.  He was found to have perforated small bowel s/p repair in OR, and remains intubated/sedated on pressors postop.  Pharmacy is consulted to dose TPN.  Significant events:  12/4 SB resection, open wound VAC.  NG tube placed 12/5 Started CRRT 12/6 Repeat OR on for washout, ileocecectomy, creation of end ileostomy,closure of abdominal wall 12/9 CRRT off 12/9 am 12/12 Exploratory laparotomy, drainage of abdominal abscess and evacuation of pelvic hematoma, RUQ drain, and left lateral drain placement. Findings per surgery note: Subdiaphragmatic and subhepatic abscesses.Large organizing pelvic hematoma. Apparent necrotic tissue left lobe of the liver.  Insulin requirements past 24 hours: 8 units SSI/24h  Current Nutrition: NPO, TPN at goal rate  IVF: none  Central access: CVC triple lumen placed 12/4 TPN start date: 12/7  ASSESSMENT                                                                                                          Today:   Glucose:  goal < 150 (no hx of DM), CBGs controlled with change back to clinimix 5/15  Electrolytes: Na elevate slightly, K low at 3.2 Mg, Phos have been WNL. CorrCa wnl. No electrolytes provided in TPN.  Renal:  SCr trending down (off CRRT 12/9a). good UOP with lasix. BUN elevated but trending down.  I/O = -3200 mL/24h, drains =  129m, stool 12774m  LFTs:  AST, ALT, Alk Phos wnl, Tbili increasing, not jaundiced  TGs:  Elevated, propofol  stopped 12/7. TGs 233 (12/5), 400 (12/6), 281 (12/7), 322 (12/10), 231 (12/14)  Prealbumin:  9.3 (12/5), 9 (12/10)  NUTRITIONAL GOALS                                                                                             RD recs (12/11): 105-127g Protein, 197824-2353cal  Based on published guidelines, while the patient meets ICU status the initiation of lipids is being delayed for 7 days or until transition out of ICU. Planned start date for lipids is 12/14.  Goal will be to meet 100% of the patient's protein needs and approximately 80% of their caloric needs.  Clinimix 5/15 @ 100 mL/hr and lipids 20% at  66m/hr over 12h to provide  120 grams protein (100% of goal) and 2064 kcal (100% of goal).  PLAN                                                                                                                          Now: KCl 10 meq x 5 runs  At 1800 today:  Clinimix (NO ELYTES) 5/15 (from 5/20) at 1017mhr   Monitor for fluid overload - PCCM adjusting diuretics  Started lipids 12/14 (held for first week of ICU status) at 1527mr over 12h (dose reduction due to mildly inc trigs)  TPN to contain standard multivitamins and trace element daily.  IVF per MD. Off currently.  Change CBGs to q4h checks and adjust to SSI to moderate scale.   TPN lab panels on Mondays & Thursdays.   BMET for AM.    DusDoreene ElandharmD, BCPS.   Pager: [phone]/15/2018 9:40 AM

## 2017-08-04 LAB — CBC
HEMATOCRIT: 23.3 % — AB (ref 39.0–52.0)
HEMOGLOBIN: 7.7 g/dL — AB (ref 13.0–17.0)
MCH: 29.5 pg (ref 26.0–34.0)
MCHC: 33 g/dL (ref 30.0–36.0)
MCV: 89.3 fL (ref 78.0–100.0)
Platelets: 986 10*3/uL (ref 150–400)
RBC: 2.61 MIL/uL — ABNORMAL LOW (ref 4.22–5.81)
RDW: 15.4 % (ref 11.5–15.5)
WBC: 27.8 10*3/uL — AB (ref 4.0–10.5)

## 2017-08-04 LAB — GLUCOSE, CAPILLARY
GLUCOSE-CAPILLARY: 141 mg/dL — AB (ref 65–99)
GLUCOSE-CAPILLARY: 154 mg/dL — AB (ref 65–99)
Glucose-Capillary: 136 mg/dL — ABNORMAL HIGH (ref 65–99)
Glucose-Capillary: 140 mg/dL — ABNORMAL HIGH (ref 65–99)
Glucose-Capillary: 124 mg/dL — ABNORMAL HIGH (ref 65–99)
Glucose-Capillary: 134 mg/dL — ABNORMAL HIGH (ref 65–99)
Glucose-Capillary: 133 mg/dL — ABNORMAL HIGH (ref 65–99)

## 2017-08-04 LAB — BASIC METABOLIC PANEL
ANION GAP: 8 (ref 5–15)
BUN: 63 mg/dL — ABNORMAL HIGH (ref 6–20)
CHLORIDE: 119 mmol/L — AB (ref 101–111)
CO2: 22 mmol/L (ref 22–32)
Calcium: 8.3 mg/dL — ABNORMAL LOW (ref 8.9–10.3)
Creatinine, Ser: 1.75 mg/dL — ABNORMAL HIGH (ref 0.61–1.24)
GFR calc non Af Amer: 40 mL/min — ABNORMAL LOW (ref >60)
GFR, EST AFRICAN AMERICAN: 46 mL/min — AB (ref >60)
GLUCOSE: 150 mg/dL — AB (ref 65–99)
Potassium: 3.2 mmol/L — ABNORMAL LOW (ref 3.5–5.1)
Sodium: 149 mmol/L — ABNORMAL HIGH (ref 135–145)

## 2017-08-04 LAB — PHOSPHORUS: PHOSPHORUS: 2.3 mg/dL — AB (ref 2.5–4.6)

## 2017-08-04 LAB — MAGNESIUM: Magnesium: 2 mg/dL (ref 1.7–2.4)

## 2017-08-04 MED ORDER — POTASSIUM CHLORIDE 10 MEQ/100ML IV SOLN
INTRAVENOUS | Status: AC
  Administered 2017-08-04: 10 meq
  Filled 2017-08-04: qty 100

## 2017-08-04 MED ORDER — CHLORHEXIDINE GLUCONATE CLOTH 2 % EX PADS
6.0000 | MEDICATED_PAD | Freq: Every day | CUTANEOUS | Status: DC
  Administered 2017-08-05 – 2017-08-31 (×27): 6 via TOPICAL

## 2017-08-04 MED ORDER — LIP MEDEX EX OINT
TOPICAL_OINTMENT | CUTANEOUS | Status: AC
  Administered 2017-08-04: 08:00:00
  Filled 2017-08-04: qty 7

## 2017-08-04 MED ORDER — SODIUM CHLORIDE 0.9% FLUSH
10.0000 mL | INTRAVENOUS | Status: DC | PRN
  Administered 2017-08-09 (×2): 10 mL
  Filled 2017-08-04 (×2): qty 40

## 2017-08-04 MED ORDER — POTASSIUM CHLORIDE 10 MEQ/50ML IV SOLN
10.0000 meq | INTRAVENOUS | Status: AC
  Administered 2017-08-04 (×6): 10 meq via INTRAVENOUS
  Filled 2017-08-04 (×6): qty 50

## 2017-08-04 MED ORDER — PANTOPRAZOLE SODIUM 40 MG IV SOLR
40.0000 mg | INTRAVENOUS | Status: DC
  Administered 2017-08-04 – 2017-08-24 (×21): 40 mg via INTRAVENOUS
  Filled 2017-08-04 (×22): qty 40

## 2017-08-04 MED ORDER — SODIUM CHLORIDE 0.9% FLUSH
10.0000 mL | Freq: Two times a day (BID) | INTRAVENOUS | Status: DC
  Administered 2017-08-04: 30 mL
  Administered 2017-08-04 – 2017-08-07 (×3): 10 mL
  Administered 2017-08-07: 20 mL
  Administered 2017-08-07 – 2017-08-10 (×6): 10 mL
  Administered 2017-08-10 – 2017-08-11 (×2): 30 mL
  Administered 2017-08-11: 10 mL
  Administered 2017-08-12: 30 mL
  Administered 2017-08-13 – 2017-08-14 (×2): 10 mL
  Administered 2017-08-15: 20 mL
  Administered 2017-08-17 – 2017-08-19 (×6): 10 mL
  Administered 2017-08-20: 40 mL
  Administered 2017-08-20 – 2017-08-22 (×3): 10 mL
  Administered 2017-08-22: 20 mL
  Administered 2017-08-23 – 2017-08-30 (×15): 10 mL

## 2017-08-04 MED ORDER — FAT EMULSION 20 % IV EMUL
180.0000 mL | INTRAVENOUS | Status: AC
  Administered 2017-08-04: 180 mL via INTRAVENOUS
  Filled 2017-08-04: qty 250

## 2017-08-04 MED ORDER — TRACE MINERALS CR-CU-MN-SE-ZN 10-1000-500-60 MCG/ML IV SOLN
INTRAVENOUS | Status: AC
  Administered 2017-08-04: 18:00:00 via INTRAVENOUS
  Filled 2017-08-04: qty 2000

## 2017-08-04 MED ORDER — LORAZEPAM 2 MG/ML IJ SOLN
1.0000 mg | INTRAMUSCULAR | Status: DC | PRN
  Administered 2017-08-04: 1 mg via INTRAVENOUS
  Administered 2017-08-04: 2 mg via INTRAVENOUS
  Administered 2017-08-04: 1 mg via INTRAVENOUS
  Administered 2017-08-06: 2 mg via INTRAVENOUS
  Administered 2017-08-07: 3 mg via INTRAVENOUS
  Filled 2017-08-04 (×4): qty 1
  Filled 2017-08-04: qty 2

## 2017-08-04 MED ORDER — LABETALOL HCL 5 MG/ML IV SOLN
5.0000 mg | INTRAVENOUS | Status: DC | PRN
  Administered 2017-08-05 – 2017-08-12 (×3): 5 mg via INTRAVENOUS
  Filled 2017-08-04 (×3): qty 4

## 2017-08-04 NOTE — Progress Notes (Addendum)
PULMONARY / CRITICAL CARE MEDICINE   Name: Caleb Singh MRN: [id number] DOB: 11/28/1955    ADMISSION DATE:  07/23/2017 CONSULTATION DATE: 07/23/2017   CHIEF COMPLAINT: Abdominal pain  BRIEF 61 yo male former smoker with severe abdominal pain, and found to have free air in abdomen from perforated small bowel and ischemic cecum. PMHx of ETOH, PE.  SUBJECTIVE: Denies chest pain.  Agitated overnight.  VITAL SIGNS: BP (!) 160/51   Pulse 99   Temp 97.6 F (36.4 C) (Oral)   Resp (!) 42   Ht 5\' 10"  (1.778 m)   Wt 170 lb 6.7 oz (77.3 kg)   SpO2 100%   BMI 24.45 kg/m   INTAKE / OUTPUT: I/O last 3 completed shifts: In: 3502.6 [I.V.:2922.6; IV Piggyback:580] Out: 4098 [JXBJY:[id number] [Urine:7425; Drains:135; Stool:1350]  PHYSICAL EXAMINATION:   General - pleasant Eyes - pupils reactive ENT - no sinus tenderness, no oral exudate, no LAN Cardiac - regular, no murmur Chest - no wheeze, rales Abd - ileostomy and JP drains in place, wound dressing clean Ext - no edema Skin - no rashes Neuro - normal strength   PULMONARY Recent Labs  Lab 07/28/17 0856 07/29/17 0418 07/30/17 0500 08/01/17 0452  PHART 7.453* 7.434 7.444 7.362  PCO2ART 35.0 36.2 34.4 39.7  PO2ART 114* 111* 82.7* 104  HCO3 24.1 23.9 23.2 21.9  O2SAT 98.2 98.3 96.3 97.7    CBC Recent Labs  Lab 08/02/17 0502 08/03/17 1140 08/04/17 0323  HGB 7.5* 7.2* 7.7*  HCT 22.6* 21.9* 23.3*  WBC 27.5* 25.6* 27.8*  PLT 702* 810* 986*    COAGULATION Recent Labs  Lab 07/31/17 0745  INR 1.58    CARDIAC  No results for input(s): TROPONINI in the last 168 hours. No results for input(s): PROBNP in the last 168 hours.   CHEMISTRY Recent Labs  Lab 07/29/17 0457 07/30/17 0425  08/01/17 0334 08/01/17 1829 08/02/17 0502 08/03/17 1140 08/04/17 0323  NA 140 139   < > 142 144 145 146* 149*  K 4.1 3.5   < > 3.6 4.0 3.7 3.2* 3.2*  CL 105 104   < > 110 113* 114* 117* 119*  CO2 24 25   < > 22 22 22 24 22   GLUCOSE 141*  124*   < > 205* 137* 156* 149* 150*  BUN 68* 91*   < > 83* 80* 77* 61* 63*  CREATININE 2.98* 3.70*   < > 2.95* 2.63* 2.50* 1.87* 1.75*  CALCIUM 7.2* 7.3*   < > 7.6* 7.8* 7.8* 7.8* 8.3*  MG 2.4 2.3  --  2.2  --  1.9  --  2.0  PHOS 4.4 3.9  --  3.1  --  2.6  --  2.3*   < > = values in this interval not displayed.   Estimated Creatinine Clearance: 44.6 mL/min (A) (by C-G formula based on SCr of 1.75 mg/dL (H)).   LIVER Recent Labs  Lab 07/29/17 0457 07/31/17 0745 08/01/17 0334  AST 44*  --  29  ALT 24  --  23  ALKPHOS 74  --  76  BILITOT 1.9*  --  2.7*  PROT 5.4*  --  6.3*  ALBUMIN 1.7*  --  1.8*  INR  --  1.58  --      INFECTIOUS No results for input(s): LATICACIDVEN, PROCALCITON in the last 168 hours.   ENDOCRINE CBG (last 3)  Recent Labs    08/03/17 1924 08/04/17 0018 08/04/17 0423  GLUCAP 108* 154* 136*  IMAGING No results found.   CULTURES Blood 12/04: E coli Blood 12/10: negative Surgical wound 12/12:  Pan sensitive E. Coli  ANTIBIOTICS Vancomycin 12/04 >> 12/04 Zosyn 12/04 >> 12/13 Anidulafungin 12/04 >> Meropenem 12/14 >>   EVENTS 12/04 Admit 12/06 Ileostomy, closure of abdominal wound 12/14 Laparotomy for abscess  LINES/TUBES Lt Tyronza CVL 12/04 >>   ASSESSMENT / PLAN:  Peritonitis from perforated small bowel and ischemic cecum s/p SB ileocecectomy, ileostomy. Abdominal abscess s/p re-exploration 12/14. E coli bacteremia. - post op care, nutrition per CCS - continue anidulafungin, meropenem - will have IV team place PICC line  Agitated delirium with hx of ETOH. - precedex - prn ativan  A fib with RVR. - back in sinus rhythm - monitor on tele  Hypertension. - scheduled lopressor, hydralazine - prn labetalol  Acute renal failure 2nd to ATN. Hypernatremia. Hypokalemia. - monitor renal fx - hold additional lasix  Anemia of critical illness. - f/u CBC  Hyperglycemia. - SSI  Deconditioning. - PT  DVT prophylaxis: SQ  heparin SUP: Protonix Diet: ice chips  + TNA Goals of care: Full code  Coralyn HellingVineet Beatrice Sehgal, MD [facility] 08/04/2017, 7:49 AM Pager:  [phone] After 3pm call: [phone]

## 2017-08-04 NOTE — Progress Notes (Signed)
Peripherally Inserted Central Catheter/Midline Placement  The IV Nurse has discussed with the patient and/or persons authorized to consent for the patient, the purpose of this procedure and the potential benefits and risks involved with this procedure.  The benefits include less needle sticks, lab draws from the catheter, and the patient may be discharged home with the catheter. Risks include, but not limited to, infection, bleeding, blood clot (thrombus formation), and puncture of an artery; nerve damage and irregular heartbeat and possibility to perform a PICC exchange if needed/ordered by physician.  Alternatives to this procedure were also discussed.  Bard Power PICC patient education guide, fact sheet on infection prevention and patient information card has been provided to patient /or left at bedside.  Sister gave telephone consent due to pt confusion.  Pt gave implied consent at bedside.  PICC/Midline Placement Documentation  PICC Triple Lumen 08/04/17 PICC Right Brachial 40 cm 0 cm (Active)  Indication for Insertion or Continuance of Line Vasoactive infusions;Prolonged intravenous therapies;Administration of hyperosmolar/irritating solutions (i.e. TPN, Vancomycin, etc.) 08/04/2017  9:00 AM  Exposed Catheter (cm) 0 cm 08/04/2017  9:00 AM  Site Assessment Clean;Dry;Intact 08/04/2017  9:00 AM  Lumen #1 Status Saline locked;Flushed;Blood return noted 08/04/2017  9:00 AM  Lumen #2 Status Flushed;Saline locked;Blood return noted 08/04/2017  9:00 AM  Lumen #3 Status Flushed;Saline locked;Blood return noted 08/04/2017  9:00 AM  Dressing Type Transparent 08/04/2017  9:00 AM  Dressing Status Clean;Dry;Intact;Antimicrobial disc in place 08/04/2017  9:00 AM  Line Care Connections checked and tightened 08/04/2017  9:00 AM  Line Adjustment (NICU/IV Team Only) No 08/04/2017  9:00 AM  Dressing Intervention New dressing 08/04/2017  9:00 AM  Dressing Change Due 08/11/17 08/04/2017  9:00 AM       Elliot Dallyiggs,  Ayad Nieman Wright 08/04/2017, 10:00 AM

## 2017-08-04 NOTE — Progress Notes (Signed)
PHARMACY - ADULT TOTAL PARENTERAL NUTRITION CONSULT NOTE   Pharmacy Consult for TPN Indication: prolonged ileus  Patient Measurements: Body mass index is 24.45 kg/m. Filed Weights   08/02/17 1123 08/03/17 0421 08/04/17 0440  Weight: 178 lb 12.7 oz (81.1 kg) 171 lb 4.8 oz (77.7 kg) 170 lb 6.7 oz (77.3 kg)    HPI: 70 YOM admitted on 12/4 with c/o severe and worsening abdominal distention, pain, nonbloody, nonbilious, non-coffee-ground emesis and lack of bowel movements x3 days.  No significant PMH.  He was found to have perforated small bowel s/p repair in OR, and remains intubated/sedated on pressors postop.  Pharmacy is consulted to dose TPN.  Significant events:  12/4 SB resection, open wound VAC.  NG tube placed 12/5 Started CRRT 12/6 Repeat OR on for washout, ileocecectomy, creation of end ileostomy,closure of abdominal wall 12/9 CRRT off 12/9 am 12/12 Exploratory laparotomy, drainage of abdominal abscess and evacuation of pelvic hematoma, RUQ drain, and left lateral drain placement. Findings per surgery note: Subdiaphragmatic and subhepatic abscesses.Large organizing pelvic hematoma. Apparent necrotic tissue left lobe of the liver.  Insulin requirements past 24 hours: 13 units SSI/24h  Current Nutrition: NPO, TPN at goal rate  IVF: none  Central access: CVC triple lumen placed 12/4 TPN start date: 12/7  ASSESSMENT                                                                                                          Today:   Glucose:  goal < 150 (no hx of DM), CBGs mostly controlled with change back to clinimix 5/15  Electrolytes: Na elevated withOUT Na in TPN (on IV lasix), K low at 3.2 (KCl runs ordered), Mg WNL, Phos low at 2.3. CorrCa wnl. No electrolytes provided in TPN.  Renal:  SCr trending down (off CRRT 12/9a). good UOP with lasix. BUN elevated but trending down.  I/O = -4000 mL/24h, drains =  51m, stool 5548m Weights trending down  LFTs:  AST, ALT, Alk  Phos wnl, Tbili increasing, not jaundiced  TGs:  Elevated, propofol stopped 12/7. TGs 233 (12/5), 400 (12/6), 281 (12/7), 322 (12/10), 231 (12/14)  Prealbumin:  9.3 (12/5), 9 (12/10)  NUTRITIONAL GOALS                                                                                             RD recs (12/11): 105-127g Protein, 194098-1191cal  Based on published guidelines, while the patient meets ICU status the initiation of lipids is being delayed for 7 days or until transition out of ICU. Planned start date for lipids is 12/14.  Goal will be to meet 100% of the patient's protein needs and approximately  80% of their caloric needs.  Clinimix 5/15 @ 100 mL/hr and lipids 20% at 10m/hr over 12h to provide  120 grams protein (100% of goal) and 2064 kcal (100% of goal).  PLAN                                                                                                                           At 1800 today:  Change Clinimix to ELECTROLYTE - containing formula 5/15 at 105mhr   Will withhold replacing Phos outside TPN and allow TPN to replete since starting electrolyte-containing formula 12/16PM  ? IV lasix cause of HYPERNa+  Started lipids 12/14 (held for first week of ICU status) at 1570mr over 12h (dose reduction due to mildly inc trigs)  TPN to contain standard multivitamins and trace element daily.  IVF per MD. Off currently.  Change CBGs to q4h checks and adjust to SSI to moderate scale.   TPN lab panels on Mondays & Thursdays.   BMET for AM.    DusDoreene ElandharmD, BCPS.   Pager: [phone]/16/2018 9:43 AM

## 2017-08-04 NOTE — Progress Notes (Signed)
4 Days Post-Op   Subjective/Chief Complaint: Awake and alert. No complaints   Objective: Vital signs in last 24 hours: Temp:  [97.6 F (36.4 C)-98.4 F (36.9 C)] 97.6 F (36.4 C) (12/16 0400) Pulse Rate:  [66-99] 98 (12/16 0700) Resp:  [22-42] 32 (12/16 0700) BP: (150-186)/(51-78) 172/73 (12/16 0700) SpO2:  [100 %] 100 % (12/16 0700) Weight:  [77.3 kg (170 lb 6.7 oz)] 77.3 kg (170 lb 6.7 oz) (12/16 0440) Last BM Date: 08/03/17  Intake/Output from previous day: 12/15 0701 - 12/16 0700 In: 2037.3 [I.V.:1687.3; IV Piggyback:350] Out: 5940 [Urine:5325; Drains:65; Stool:550] Intake/Output this shift: Total I/O In: 65.3 [I.V.:15.3; IV Piggyback:50] Out: -   General appearance: alert and cooperative Resp: clear to auscultation bilaterally and increased work of breathing Cardio: regular rate and rhythm GI: soft, mild tenderness. ostomy pink and productive. open wound clean  Lab Results:  Recent Labs    08/03/17 1140 08/04/17 0323  WBC 25.6* 27.8*  HGB 7.2* 7.7*  HCT 21.9* 23.3*  PLT 810* 986*   BMET Recent Labs    08/03/17 1140 08/04/17 0323  NA 146* 149*  K 3.2* 3.2*  CL 117* 119*  CO2 24 22  GLUCOSE 149* 150*  BUN 61* 63*  CREATININE 1.87* 1.75*  CALCIUM 7.8* 8.3*   PT/INR No results for input(s): LABPROT, INR in the last 72 hours. ABG No results for input(s): PHART, HCO3 in the last 72 hours.  Invalid input(s): PCO2, PO2  Studies/Results: No results found.  Anti-infectives: Anti-infectives (From admission, onward)   Start     Dose/Rate Route Frequency Ordered Stop   08/02/17 1400  meropenem (MERREM) 1 g in sodium chloride 0.9 % 100 mL IVPB     1 g 200 mL/hr over 30 Minutes Intravenous Every 12 hours 08/02/17 1111     07/29/17 0600  piperacillin-tazobactam (ZOSYN) IVPB 3.375 g  Status:  Discontinued     3.375 g 12.5 mL/hr over 240 Minutes Intravenous Every 8 hours 07/29/17 0518 08/02/17 1036   07/25/17 2000  vancomycin (VANCOCIN) IVPB 1000 mg/200  mL premix  Status:  Discontinued     1,000 mg 200 mL/hr over 60 Minutes Intravenous Every 48 hours 07/23/17 2042 07/24/17 0956   07/24/17 2200  piperacillin-tazobactam (ZOSYN) 3.375 g in dextrose 5 % 50 mL IVPB  Status:  Discontinued     3.375 g 100 mL/hr over 30 Minutes Intravenous Every 6 hours 07/24/17 2006 07/29/17 0518   07/24/17 2100  anidulafungin (ERAXIS) 100 mg in sodium chloride 0.9 % 100 mL IVPB     100 mg 78 mL/hr over 100 Minutes Intravenous Every 24 hours 07/23/17 2039     07/24/17 0000  anidulafungin (ERAXIS) 100 mg in sodium chloride 0.9 % 100 mL IVPB  Status:  Discontinued     100 mg 78 mL/hr over 100 Minutes Intravenous Every 24 hours 07/23/17 2038 07/23/17 2039   07/23/17 2200  piperacillin-tazobactam (ZOSYN) IVPB 3.375 g  Status:  Discontinued     3.375 g 12.5 mL/hr over 240 Minutes Intravenous Every 8 hours 07/23/17 2043 07/24/17 2006   07/23/17 2100  anidulafungin (ERAXIS) 200 mg in sodium chloride 0.9 % 200 mL IVPB     200 mg 78 mL/hr over 200 Minutes Intravenous  Once 07/23/17 2038 07/24/17 0239   07/23/17 2100  vancomycin (VANCOCIN) 1,500 mg in sodium chloride 0.9 % 500 mL IVPB     1,500 mg 250 mL/hr over 120 Minutes Intravenous  Once 07/23/17 2041 07/23/17 2317   07/23/17  1515  piperacillin-tazobactam (ZOSYN) IVPB 3.375 g     3.375 g 100 mL/hr over 30 Minutes Intravenous  Once 07/23/17 1511 07/23/17 1611      Assessment/Plan: s/p Procedure(s): EXPLORATORY LAPAROTOMY drainage of abdominal abcess (N/A) allow clears since ostomy is working  Continue dressing changes merepenem and eraxis for bowel perforation tpn for nutrition support Wbc rising. Consider repeat CT soon if this continues. Last exploration was only 4 days ago and drains are in place  LOS: 12 days    TOTH III,Angelina Venard S 08/04/2017

## 2017-08-04 NOTE — Progress Notes (Signed)
CRITICAL VALUE ALERT  Critical Value: Platelets 986  Date & Time Notied:  08/04/2017 0500 am  Provider Notified: Text paged MD  Orders Received/Actions taken: Waiting for orders. Will continue to monitor

## 2017-08-04 NOTE — Progress Notes (Signed)
eLink Physician-Brief Progress Note Patient Name: Caleb LaundryFrederick Singh DOB: November 28, 1955 MRN: [id number]   Date of Service  08/04/2017  HPI/Events of Note  Hypokalemia  eICU Interventions  Potassium replaced     Intervention Category Intermediate Interventions: Electrolyte abnormality - evaluation and management  DETERDING,[name] 08/04/2017, 4:47 AM

## 2017-08-04 NOTE — Progress Notes (Signed)
PT Cancellation Note  Patient Details Name: Caleb Singh MRN: [id number] DOB: 10-10-1955   Cancelled Treatment:    Reason Eval/Treat Not Completed: Patient not medically ready. On Precedex, noted agitation overnight. Will check back another time .   Rada HayHill, [name] [name] 08/04/2017, 8:12 AM [name] PT [phone]

## 2017-08-05 DIAGNOSIS — R410 Disorientation, unspecified: Secondary | ICD-10-CM

## 2017-08-05 LAB — DIFFERENTIAL
BASOS PCT: 1 %
Basophils Absolute: 0.2 10*3/uL — ABNORMAL HIGH (ref 0.0–0.1)
EOS PCT: 0 %
Eosinophils Absolute: 0 10*3/uL (ref 0.0–0.7)
LYMPHS ABS: 2.3 10*3/uL (ref 0.7–4.0)
Lymphocytes Relative: 10 %
MONO ABS: 1.4 10*3/uL — AB (ref 0.1–1.0)
Monocytes Relative: 6 %
Neutro Abs: 19.4 10*3/uL — ABNORMAL HIGH (ref 1.7–7.7)
Neutrophils Relative %: 83 %

## 2017-08-05 LAB — COMPREHENSIVE METABOLIC PANEL
ALT: 28 U/L (ref 17–63)
AST: 35 U/L (ref 15–41)
Albumin: 2 g/dL — ABNORMAL LOW (ref 3.5–5.0)
Alkaline Phosphatase: 112 U/L (ref 38–126)
Anion gap: 8 (ref 5–15)
BILIRUBIN TOTAL: 2.3 mg/dL — AB (ref 0.3–1.2)
BUN: 65 mg/dL — AB (ref 6–20)
CALCIUM: 8.8 mg/dL — AB (ref 8.9–10.3)
CO2: 20 mmol/L — ABNORMAL LOW (ref 22–32)
CREATININE: 1.65 mg/dL — AB (ref 0.61–1.24)
Chloride: 127 mmol/L — ABNORMAL HIGH (ref 101–111)
GFR calc Af Amer: 49 mL/min — ABNORMAL LOW (ref >60)
GFR, EST NON AFRICAN AMERICAN: 43 mL/min — AB (ref >60)
Glucose, Bld: 139 mg/dL — ABNORMAL HIGH (ref 65–99)
Potassium: 3.7 mmol/L (ref 3.5–5.1)
Sodium: 155 mmol/L — ABNORMAL HIGH (ref 135–145)
TOTAL PROTEIN: 7.5 g/dL (ref 6.5–8.1)

## 2017-08-05 LAB — AEROBIC/ANAEROBIC CULTURE W GRAM STAIN (SURGICAL/DEEP WOUND)

## 2017-08-05 LAB — GLUCOSE, CAPILLARY
GLUCOSE-CAPILLARY: 127 mg/dL — AB (ref 65–99)
GLUCOSE-CAPILLARY: 149 mg/dL — AB (ref 65–99)
Glucose-Capillary: 131 mg/dL — ABNORMAL HIGH (ref 65–99)
Glucose-Capillary: 182 mg/dL — ABNORMAL HIGH (ref 65–99)
Glucose-Capillary: 173 mg/dL — ABNORMAL HIGH (ref 65–99)

## 2017-08-05 LAB — TRIGLYCERIDES: Triglycerides: 216 mg/dL — ABNORMAL HIGH (ref <150)

## 2017-08-05 LAB — CBC
HEMATOCRIT: 23.5 % — AB (ref 39.0–52.0)
Hemoglobin: 7.3 g/dL — ABNORMAL LOW (ref 13.0–17.0)
MCH: 28 pg (ref 26.0–34.0)
MCHC: 31.1 g/dL (ref 30.0–36.0)
MCV: 90 fL (ref 78.0–100.0)
Platelets: 980 10*3/uL (ref 150–400)
RBC: 2.61 MIL/uL — ABNORMAL LOW (ref 4.22–5.81)
RDW: 15.4 % (ref 11.5–15.5)
WBC: 23.3 10*3/uL — AB (ref 4.0–10.5)

## 2017-08-05 LAB — AEROBIC/ANAEROBIC CULTURE (SURGICAL/DEEP WOUND)

## 2017-08-05 LAB — PREALBUMIN: Prealbumin: 18.8 mg/dL (ref 18–38)

## 2017-08-05 LAB — PHOSPHORUS: Phosphorus: 3.3 mg/dL (ref 2.5–4.6)

## 2017-08-05 LAB — MAGNESIUM: MAGNESIUM: 2.5 mg/dL — AB (ref 1.7–2.4)

## 2017-08-05 MED ORDER — CHLORHEXIDINE GLUCONATE 0.12 % MT SOLN
OROMUCOSAL | Status: AC
  Filled 2017-08-05: qty 15

## 2017-08-05 MED ORDER — VITAMINS A & D EX OINT
TOPICAL_OINTMENT | CUTANEOUS | Status: AC
  Administered 2017-08-06: 03:00:00
  Filled 2017-08-05: qty 5

## 2017-08-05 MED ORDER — TRACE MINERALS CR-CU-MN-SE-ZN 10-1000-500-60 MCG/ML IV SOLN
INTRAVENOUS | Status: AC
  Administered 2017-08-05: 19:00:00 via INTRAVENOUS
  Filled 2017-08-05: qty 2400

## 2017-08-05 MED ORDER — PIPERACILLIN-TAZOBACTAM 3.375 G IVPB
3.3750 g | Freq: Three times a day (TID) | INTRAVENOUS | Status: DC
  Administered 2017-08-05 – 2017-08-24 (×57): 3.375 g via INTRAVENOUS
  Filled 2017-08-05 (×58): qty 50

## 2017-08-05 MED ORDER — FAT EMULSION 20 % IV EMUL
180.0000 mL | INTRAVENOUS | Status: AC
  Administered 2017-08-05: 180 mL via INTRAVENOUS
  Filled 2017-08-05: qty 250

## 2017-08-05 MED ORDER — BOOST / RESOURCE BREEZE PO LIQD CUSTOM
1.0000 | Freq: Three times a day (TID) | ORAL | Status: DC
  Administered 2017-08-05 (×2): 1 via ORAL

## 2017-08-05 MED ORDER — DEXTROSE 5 % IV SOLN
INTRAVENOUS | Status: DC
  Administered 2017-08-05: 50 mL via INTRAVENOUS
  Administered 2017-08-06: 07:00:00 via INTRAVENOUS

## 2017-08-05 NOTE — Evaluation (Signed)
Physical Therapy Evaluation Patient Details Name: Caleb LaundryFrederick Singh MRN: [id number] DOB: 15-Jun-1956 Today's Date: 08/05/2017   History of Present Illness  61 yo male admitted with intestinal perforation. S/P resection/ileostomy 12/5+12/6. S/P abscess drainage 12/12. VDRF-extubated 12/14. A fib with RVR. Hx of ETOH abuse, PE.     Clinical Impression  On eval, pt required Mod assist +2 for mobility. Pt is significantly weak compared to baseline. Pt presents with general weakness, decreased activity tolerance, and impaired gait and balance. He was able to sit EOB with Min-Mod assist to correct frequent loss of balance. He was able to stand for ~10-15 second intervals x 2  before becoming fatigued. Pt was able to perform a squat pivot transfer, bed to recliner, with above indicated assistance level. Pt was independent and lived alone at baseline. At this time, recommendation is for a CIR consult to determine appropriateness for inpatient rehab.Will continue to follow and progress activity as tolerated.     Follow Up Recommendations CIR    Equipment Recommendations  (continuing to assess)    Recommendations for Other Services OT consult;Rehab consult     Precautions / Restrictions Precautions Precautions: Fall Precaution Comments: monitor vitals; 2 abdominal drains Restrictions Weight Bearing Restrictions: No      Mobility  Bed Mobility Overal bed mobility: Needs Assistance Bed Mobility: Supine to Sit     Supine to sit: Min assist;+2 for physical assistance;+2 for safety/equipment;HOB elevated     General bed mobility comments: Assist for LEs and trunk. Increased time. Moderate reliance on bedrail. VCs safety, technique.   Transfers Overall transfer level: Needs assistance Equipment used: 2 person hand held assist Transfers: Sit to/from [employer] CorporationStand;Squat Pivot Transfers Sit to Stand: From elevated surface;Mod assist;+2 physical assistance;+2 safety/equipment   Squat pivot transfers:  Mod assist;+2 physical assistance;+2 safety/equipment     General transfer comment: Assist to rise, stabilize, control descent. VCS safety, hand placement, technique. Sit to stand x 2. Pt was able to stand statically for ~15 seconds before fatigued. Uncontrolled descent. Squat pivot, bed to recliner.   Ambulation/Gait             General Gait Details: NT-too weak to safely attempt  Stairs            Wheelchair Mobility    Modified Rankin (Stroke Patients Only)       Balance Overall balance assessment: Needs assistance Sitting-balance support: Bilateral upper extremity supported;No upper extremity supported;Feet supported Sitting balance-Leahy Scale: Poor Sitting balance - Comments: Multidirectional LOB due to trunk weakness. Pt could sit unsupported and unassisted for ~5-10 second intervals before LOB.                                     Pertinent Vitals/Pain Pain Assessment: No/denies pain    Home Living Family/patient expects to be discharged to:: Private residence Living Arrangements: Alone   Type of Home: Apartment Home Access: Stairs to enter Entrance Stairs-Rails: Right Entrance Stairs-Number of Steps: 1 flight Home Layout: One level Home Equipment: None      Prior Function Level of Independence: Independent               Hand Dominance        Extremity/Trunk Assessment   Upper Extremity Assessment Upper Extremity Assessment: Generalized weakness    Lower Extremity Assessment Lower Extremity Assessment: Generalized weakness;LLE deficits/detail;RLE deficits/detail RLE Deficits / Details: Strength 3/5 RLE Coordination: decreased fine motor;decreased gross motor  LLE Deficits / Details: Strength 3/5 LLE Coordination: decreased gross motor;decreased fine motor       Communication   Communication: No difficulties  Cognition Arousal/Alertness: Awake/alert Behavior During Therapy: WFL for tasks assessed/performed Overall  Cognitive Status: Within Functional Limits for tasks assessed                                        General Comments      Exercises     Assessment/Plan    PT Assessment Patient needs continued PT services  PT Problem List Decreased strength;Decreased mobility;Decreased coordination;Decreased activity tolerance;Decreased balance;Decreased knowledge of use of DME       PT Treatment Interventions DME instruction;Gait training;Functional mobility training;Balance training;Patient/family education;Therapeutic activities;Therapeutic exercise;Neuromuscular re-education    PT Goals (Current goals can be found in the Care Plan section)  Acute Rehab PT Goals Patient Stated Goal: to regain PLOF PT Goal Formulation: With patient/family Time For Goal Achievement: 08/19/17 Potential to Achieve Goals: Good    Frequency Min 3X/week   Barriers to discharge        Co-evaluation               AM-PAC PT "6 Clicks" Daily Activity  Outcome Measure Difficulty turning over in bed (including adjusting bedclothes, sheets and blankets)?: Unable   Difficulty sitting down on and standing up from a chair with arms (e.g., wheelchair, bedside commode, etc,.)?: Unable Help needed moving to and from a bed to chair (including a wheelchair)?: Total Help needed walking in hospital room?: Total Help needed climbing 3-5 steps with a railing? : Total 6 Click Score: 5    End of Session   Activity Tolerance: Patient limited by fatigue Patient left: in chair;with chair alarm set;with family/visitor present   PT Visit Diagnosis: Muscle weakness (generalized) (M62.81);Other abnormalities of gait and mobility (R26.89);Other symptoms and signs involving the nervous system (R29.898)    Time: [id number] PT Time Calculation (min) (ACUTE ONLY): 29 min   Charges:   PT Evaluation $PT Eval Moderate Complexity: 1 Mod PT Treatments $Therapeutic Activity: 8-22 mins   PT G Codes:           Rebeca AlertJannie Azjah Pardo, MPT Pager: [phone]

## 2017-08-05 NOTE — Progress Notes (Signed)
Date: August 05, 2017 Rhonda Davis, BSN, RN3, CCM [phone] Chart and notes review for patient progress and needs. Will follow for case management and discharge needs. Next review date: 12202018 

## 2017-08-05 NOTE — Progress Notes (Signed)
PULMONARY / CRITICAL CARE MEDICINE   Name: Caleb Singh MRN: [id number] DOB: 1956/05/11    ADMISSION DATE:  07/23/2017 CONSULTATION DATE: 07/23/2017   CHIEF COMPLAINT: Abdominal pain  BRIEF 61 y/o male former smoker with severe abdominal pain, and found to have free air in abdomen from perforated small bowel and ischemic cecum.  S/P resection of terminal ileum for ischemia.  Required re-look exploratory laparotomy with drainage of abdominal abscesses and evacuation of pelvic hematoma.    PMHx of ETOH, PE.  SUBJECTIVE:  RN reports pt able to cough and clear secretions.  After working with the patient this am, he coughed up a large thick amount of sputum.  Concern with tachypnea but no distress.  Off precedex since 2300 12/16.  Afebrile.     VITAL SIGNS: BP (!) 152/70   Pulse (!) 115   Temp 97.9 F (36.6 C) (Axillary)   Resp (!) 48   Ht 5\' 10"  (1.778 m)   Wt 165 lb 5.5 oz (75 kg)   SpO2 97%   BMI 23.72 kg/m   INTAKE / OUTPUT: I/O last 3 completed shifts: In: 3547.3 [I.V.:2757.3; IV Piggyback:790] Out: [id number] [Urine:5145; Drains:54; Stool:440]  PHYSICAL EXAMINATION:  General:  Chronically ill appearing adult male in NAD, sitting up in bed.   HEENT: MM pink/dry, no jvd Neuro: Awake, alert, able to answer orientation questions appropriately, MAE / generalized weakness  CV: s1s2 rrr, no m/r/g PULM: even/non-labored, lungs bilaterally coarse  GI: midline abd dressing Extremities: warm/dry, no edema  Skin:  Dry, flaky / peeling skin, RUE PICC c/d/i     PULMONARY Recent Labs  Lab 07/30/17 0500 08/01/17 0452  PHART 7.444 7.362  PCO2ART 34.4 39.7  PO2ART 82.7* 104  HCO3 23.2 21.9  O2SAT 96.3 97.7    CBC Recent Labs  Lab 08/03/17 1140 08/04/17 0323 08/05/17 0620  HGB 7.2* 7.7* 7.3*  HCT 21.9* 23.3* 23.5*  WBC 25.6* 27.8* 23.3*  PLT 810* 986* 980*    COAGULATION Recent Labs  Lab 07/31/17 0745  INR 1.58    CARDIAC  No results for input(s): TROPONINI  in the last 168 hours. No results for input(s): PROBNP in the last 168 hours.   CHEMISTRY Recent Labs  Lab 07/30/17 0425  08/01/17 0334 08/01/17 1829 08/02/17 0502 08/03/17 1140 08/04/17 0323 08/05/17 0620  NA 139   < > 142 144 145 146* 149* 155*  K 3.5   < > 3.6 4.0 3.7 3.2* 3.2* 3.7  CL 104   < > 110 113* 114* 117* 119* 127*  CO2 25   < > 22 22 22 24 22  20*  GLUCOSE 124*   < > 205* 137* 156* 149* 150* 139*  BUN 91*   < > 83* 80* 77* 61* 63* 65*  CREATININE 3.70*   < > 2.95* 2.63* 2.50* 1.87* 1.75* 1.65*  CALCIUM 7.3*   < > 7.6* 7.8* 7.8* 7.8* 8.3* 8.8*  MG 2.3  --  2.2  --  1.9  --  2.0 2.5*  PHOS 3.9  --  3.1  --  2.6  --  2.3* 3.3   < > = values in this interval not displayed.   Estimated Creatinine Clearance: 47.3 mL/min (A) (by C-G formula based on SCr of 1.65 mg/dL (H)).   LIVER Recent Labs  Lab 07/31/17 0745 08/01/17 0334 08/05/17 0620  AST  --  29 35  ALT  --  23 28  ALKPHOS  --  76 112  BILITOT  --  2.7* 2.3*  PROT  --  6.3* 7.5  ALBUMIN  --  1.8* 2.0*  INR 1.58  --   --      INFECTIOUS No results for input(s): LATICACIDVEN, PROCALCITON in the last 168 hours.   ENDOCRINE CBG (last 3)  Recent Labs    08/04/17 2322 08/05/17 0324 08/05/17 0800  GLUCAP 133* 131* 127*    IMAGING No results found.   CULTURES Blood 12/04: E coli Blood 12/10: negative Surgical wound 12/12:  Pan sensitive E. Coli  ANTIBIOTICS Vancomycin 12/04 >> 12/04 Zosyn 12/04 >> 12/13 Anidulafungin 12/04 >> 12/17 Meropenem 12/14 >> 12/17 Zosyn 12/17 >>   EVENTS 12/04 Admit 12/06 Ileostomy, closure of abdominal wound 12/14 Laparotomy for abscess, evacuation of hematoma  12/16 Off precedex   LINES/TUBES Lt [place] CVL 12/04 >> 12/16 RUE PICC 12/16 >>   ASSESSMENT / PLAN:  Peritonitis from perforated small bowel and ischemic cecum s/p SB ileocecectomy, ileostomy. Abdominal abscess s/p re-exploration 12/14. E coli bacteremia. P: Post-operative care, nutrition per  CCS Discontinue anidulafungin (has had 15 days) Change meropenem to zosyn, consider narrow to unasyn in am 12/18  Agitated delirium with hx of ETOH. P: Monitor off precedex  PRN ativan for agitation / anxiety   At Risk Atelectasis  P: Pulmonary hygiene - IS / flutter, mobilize  Follow intermittent CXR   A fib with RVR. P: Tele monitoring   Hypertension. P: Continue lopressor, hydralazine PRN labetalol   Acute renal failure 2nd to ATN. Hypernatremia. Hypokalemia. P: D5W @ 50 ml/hr with hypernatremia (until we can give free water) Trend BMP / urinary output Replace electrolytes as indicated Avoid nephrotoxic agents, ensure adequate renal perfusion  Anemia of critical illness. P: Follow CBC   Hyperglycemia. P: SSI   Deconditioning. P: PT efforts   DVT prophylaxis: SQ heparin SUP: Protonix Diet: ice chips  + TNA Goals of care: Full code   [name], NP-C [facility] Pgr: [phone] or if no answer [phone] 08/05/2017, 10:19 AM

## 2017-08-05 NOTE — Progress Notes (Signed)
5 Days Post-Op    CC: Abdominal pain  Subjective: Patient is awake and looks much better this a.m. that he did last Friday.  He has some drainage through his ileostomy, but no bowel sounds.  Open wound is stable there is a fair amount of fat necrosis at the base.  It has recently been changed but he does not have the same degree of discharge from the midline he had last week.  Drain #2 remains to have bloody-serous drainage.  Drain #1 continues to have purulent appearing drainage.  Objective: Vital signs in last 24 hours: Temp:  [98 F (36.7 C)-99.4 F (37.4 C)] 98.5 F (36.9 C) (12/17 0327) Pulse Rate:  [37-126] 117 (12/17 0500) Resp:  [33-50] 45 (12/17 0600) BP: (127-165)/(61-88) 152/71 (12/17 0600) SpO2:  [97 %-100 %] 99 % (12/17 0500) Weight:  [75 kg (165 lb 5.5 oz)] 75 kg (165 lb 5.5 oz) (12/17 0600) Last BM Date: 08/04/17 NPO 3286 IV 2270 urine Drain 39 Stool 140 recorded Afebrile, no core temps, Tachycardia is back, good saturations on [place]-O2/RA Weight is down  208 lb 12/5 =>> 165 today NA 155, Cl 127, mag 2.5 Creatinine is improving, K+ 3.7 Bilirubin is up some but stable WBC 23.3 still up but trending down E coli from blood culture and abdominal cultures Intake/Output from previous day: 12/16 0701 - 12/17 0700 In: 3286.7 [I.V.:2626.7; IV Piggyback:660] Out: 8786 [Urine:2270; Drains:39; Stool:140] Intake/Output this shift: No intake/output data recorded.  General appearance: alert, cooperative, no distress and Voice is stronger and he appears much more with it than he did on Friday after extubation. Resp: Clear anterior exam. Cardio: Sinus tachycardia his heart rates up some. GI: Open abdomen looks stable.  He has some necrosis at the base.  Much less drainage than he had on Friday.  Some drainage from the ileostomy, no bowel sounds.  Drain #1 still has purulent fluid within it.  Drain #2 is serosanguineous.  Lab Results:  Recent Labs    08/04/17 0323  08/05/17 0620  WBC 27.8* 23.3*  HGB 7.7* 7.3*  HCT 23.3* 23.5*  PLT 986* 980*    BMET Recent Labs    08/04/17 0323 08/05/17 0620  NA 149* 155*  K 3.2* 3.7  CL 119* 127*  CO2 22 20*  GLUCOSE 150* 139*  BUN 63* 65*  CREATININE 1.75* 1.65*  CALCIUM 8.3* 8.8*   PT/INR No results for input(s): LABPROT, INR in the last 72 hours.  Recent Labs  Lab 08/01/17 0334 08/05/17 0620  AST 29 35  ALT 23 28  ALKPHOS 76 112  BILITOT 2.7* 2.3*  PROT 6.3* 7.5  ALBUMIN 1.8* 2.0*     Lipase     Component Value Date/Time   LIPASE 47 07/23/2017 1455     Medications: . chlorhexidine gluconate (MEDLINE KIT)  15 mL Mouth Rinse BID  . Chlorhexidine Gluconate Cloth  6 each Topical Daily  . Chlorhexidine Gluconate Cloth  6 each Topical Daily  . folic acid  1 mg Intravenous Daily  . heparin injection (subcutaneous)  5,000 Units Subcutaneous Q8H  . hydrALAZINE  20 mg Intravenous Q4H  . insulin aspart  0-15 Units Subcutaneous Q4H  . mouth rinse  15 mL Mouth Rinse QID  . metoprolol tartrate  5 mg Intravenous Q6H  . pantoprazole (PROTONIX) IV  40 mg Intravenous Q24H  . sodium chloride flush  10-40 mL Intracatheter Q12H  . sodium chloride flush  10-40 mL Intracatheter Q12H  . thiamine injection  100 mg Intravenous Daily   . anidulafungin Stopped (08/04/17 2304)  . dexmedetomidine (PRECEDEX) IV infusion Stopped (08/04/17 1200)  . meropenem (MERREM) IV Stopped (08/04/17 2308)  . Marland KitchenTPN (CLINIMIX-E) Adult 100 mL/hr at 08/04/17 1811   Anti-infectives (From admission, onward)   Start     Dose/Rate Route Frequency Ordered Stop   08/02/17 1400  meropenem (MERREM) 1 g in sodium chloride 0.9 % 100 mL IVPB     1 g 200 mL/hr over 30 Minutes Intravenous Every 12 hours 08/02/17 1111     07/29/17 0600  piperacillin-tazobactam (ZOSYN) IVPB 3.375 g  Status:  Discontinued     3.375 g 12.5 mL/hr over 240 Minutes Intravenous Every 8 hours 07/29/17 0518 08/02/17 1036   07/25/17 2000  vancomycin  (VANCOCIN) IVPB 1000 mg/200 mL premix  Status:  Discontinued     1,000 mg 200 mL/hr over 60 Minutes Intravenous Every 48 hours 07/23/17 2042 07/24/17 0956   07/24/17 2200  piperacillin-tazobactam (ZOSYN) 3.375 g in dextrose 5 % 50 mL IVPB  Status:  Discontinued     3.375 g 100 mL/hr over 30 Minutes Intravenous Every 6 hours 07/24/17 2006 07/29/17 0518   07/24/17 2100  anidulafungin (ERAXIS) 100 mg in sodium chloride 0.9 % 100 mL IVPB     100 mg 78 mL/hr over 100 Minutes Intravenous Every 24 hours 07/23/17 2039     07/24/17 0000  anidulafungin (ERAXIS) 100 mg in sodium chloride 0.9 % 100 mL IVPB  Status:  Discontinued     100 mg 78 mL/hr over 100 Minutes Intravenous Every 24 hours 07/23/17 2038 07/23/17 2039   07/23/17 2200  piperacillin-tazobactam (ZOSYN) IVPB 3.375 g  Status:  Discontinued     3.375 g 12.5 mL/hr over 240 Minutes Intravenous Every 8 hours 07/23/17 2043 07/24/17 2006   07/23/17 2100  anidulafungin (ERAXIS) 200 mg in sodium chloride 0.9 % 200 mL IVPB     200 mg 78 mL/hr over 200 Minutes Intravenous  Once 07/23/17 2038 07/24/17 0239   07/23/17 2100  vancomycin (VANCOCIN) 1,500 mg in sodium chloride 0.9 % 500 mL IVPB     1,500 mg 250 mL/hr over 120 Minutes Intravenous  Once 07/23/17 2041 07/23/17 2317   07/23/17 1515  piperacillin-tazobactam (ZOSYN) IVPB 3.375 g     3.375 g 100 mL/hr over 30 Minutes Intravenous  Once 07/23/17 1511 07/23/17 1611      Assessment/Plan Perforated distal small boweland ischemia of cecum 1.S/Pexploratory laparotomy, small bowel resection without anastomosis, placement of open wound VAC system 07/23/17 Dr. Randall Hiss Wilson/Dr. Fanny Skates 2. S/p Reexploration of abdomen, ileocecectomy, creation of end ileostomy,closure of abdominal wall, 07/25/17, Dr. [name] - Post op ileus  3. CT 12/11: ? Recurrent bowel perforation, ? Left hepatic abscess, ? RLL penumonia, mild dehiscence of anterior abdominal wound. Exploratory laparotomy,  drainage of abdominal abscess and evacuation of pelvic hematoma, RUQ drain, and left lateral drain placement, 07/31/17, Dr. Excell Seltzer (Findings: Subdiaphragmatic and subhepatic abscesses.Large organizing pelvic hematoma. Apparent necrotic tissue left lobe of the liver.)    Sepsis/shock -vasopressors off - WBC rising Bacteremia -  Enterobacteriaceae + E Coli Specimen Description ABSCESS ABDOMEN   Special Requests NONE   Gram Stain RARE WBC PRESENT,BOTH PMN AND MONONUCLEAR  FEW GRAM NEGATIVE RODS  RARE GRAM POSITIVE RODS  Performed at [facility] [address] [place], [place] [address]     Culture MODERATE ESCHERICHIA COLI  NO ANAEROBES ISOLATED; CULTURE IN PROGRESS FOR 5 DAYS  AF - Asystole 07/29/17 - no Code Blue -Cardizem currently off E coliBacteremia - Zosyn Acute respiratory failure -  Acute renal failure-CRRT discontinued 07/27/17 Malnutrition/Decondtioning - Prealbumin <5 Anemia - transfused 1 Unit PRBC12/8/18,07/29/17, and12/11/18 - 2 PRBC Postop ileus History of heavy alcohol use  History of tobacco use FEN: TPN/IV fluids -  Ice chips with sips of clears LT:[id number] 07/23/17- 07/24/17,Zosyn 07/23/17=>>day 13, anidulafungin 07/23/17 =>>day13 QTM:AUQJ -  Heparin Follow-up:Dr. Greer Pickerel   Plan: Continue medical management as above.  Nurse reports took a Hoyer lift to get him up in the chair yesterday.  Continue 3 times daily dressing changes.  I am going to give him some ice chips with [place] sips of clears this a.m. and see how he does.  He has an ongoing ileus. I will let Pharmacy and Medicine work on electrolytes.  He looks like he will need more blood later this week also.         LOS: 13 days    Caleb Singh 08/05/2017 [phone]

## 2017-08-05 NOTE — Progress Notes (Signed)
Rehab Admissions Coordinator Note:  Patient was screened by Trish MageLogue, Naziyah Tieszen M for appropriateness for an Inpatient Acute Rehab Consult. Patient has [employer] insurance which does not pay for acute inpatient rehab.  [employer] does cover SNF in an approved SNF facility.  I will see patient tomorrow to discuss rehab venues.  Likely will need SNF.  Trish MageLogue, Alejandria Wessells M 08/05/2017, 4:03 PM  I can be reached at [phone].

## 2017-08-05 NOTE — Progress Notes (Signed)
Pharmacy Antibiotic Note  Caleb Singh is a 61 y.o. male admitted on 07/23/2017 with intra-abdominal infection s/p bowel perforation.  Pharmacy has been consulted for Zosyn dosing.  Today, 08/05/2017: Day # 14 antibiotics; D4 meropenem (escalated from Zosyn); D14 anidulafungin Afebrile WBC 23.3, elevated but improving SCr improved to 1.65, CrCl ~ 47 ml/min  Plan:  D/C Meropenem and anidulafungin  Zosyn 3.375g IV Q8H infused over 4hrs.  Follow up renal fxn, culture results, and clinical course.   Height: 5\' 10"  (177.8 cm) Weight: 165 lb 5.5 oz (75 kg) IBW/kg (Calculated) : 73  Temp (24hrs), Avg:98.5 F (36.9 C), Min:97.9 F (36.6 C), Max:99.4 F (37.4 C)  Recent Labs  Lab 08/01/17 0325  08/01/17 1829 08/02/17 0502 08/03/17 1140 08/04/17 0323 08/05/17 0620  WBC 26.1*  --   --  27.5* 25.6* 27.8* 23.3*  CREATININE  --    < > 2.63* 2.50* 1.87* 1.75* 1.65*   < > = values in this interval not displayed.    Estimated Creatinine Clearance: 47.3 mL/min (A) (by C-G formula based on SCr of 1.65 mg/dL (H)).    No Known Allergies  Antimicrobials this admission: 12/4 Vancomycin>>12/5 12/4 Zosyn>>12/14 12/14 meropenem >> 12/17 12/4 Eraxis >> 12/17 12/17 Zosyn >>   Dose adjustments this admission:  Microbiology results: 12/4BCx: 1of 3 bottles GNR (BCID = E.coli, no resistance) PANSENSITIVE 12/10 BCx: ngtd 12/11 UCx: NGF 12/12 abscess cx: E/ coli (PANSENSITIVE)  Thank you for allowing pharmacy to be a part of this patient's care.  Lynann Beaverhristine Delita Chiquito PharmD, BCPS Pager [phone] 08/05/2017 9:48 AM

## 2017-08-05 NOTE — Progress Notes (Signed)
PHARMACY - ADULT TOTAL PARENTERAL NUTRITION CONSULT NOTE   Pharmacy Consult for TPN Indication: prolonged ileus  Patient Measurements: Body mass index is 23.72 kg/m. Filed Weights   08/03/17 0421 08/04/17 0440 08/05/17 0600  Weight: 171 lb 4.8 oz (77.7 kg) 170 lb 6.7 oz (77.3 kg) 165 lb 5.5 oz (75 kg)    HPI: 89 YOM admitted on 12/4 with c/o severe and worsening abdominal distention, pain, nonbloody, nonbilious, non-coffee-ground emesis and lack of bowel movements x3 days.  No significant PMH.  He was found to have perforated small bowel s/p repair in OR, and remains intubated/sedated on pressors postop.  Pharmacy is consulted to dose TPN.  Significant events:  12/4 SB resection, open wound VAC.  NG tube placed 12/5 Started CRRT 12/6 Repeat OR on for washout, ileocecectomy, creation of end ileostomy,closure of abdominal wall 12/9 CRRT off 12/9 am 12/12 Exploratory laparotomy, drainage of abdominal abscess and evacuation of pelvic hematoma, RUQ drain, and left lateral drain placement. Findings per surgery note: Subdiaphragmatic and subhepatic abscesses.Large organizing pelvic hematoma. Apparent necrotic tissue left lobe of the liver. 12/14 Extubated 12/17 Some drainage through his ileostomy, but no bowel sounds.  No diet, may have ice chips, sips of clears.  Insulin requirements past 24 hours: 12 units SSI/24h  Current Nutrition: NPO except for ice chips, sips of clears.  TPN at goal rate  IVF: none  Central access: CVC triple lumen placed 12/4 TPN start date: 12/7  ASSESSMENT                                                                                                          Today:   Glucose:  goal < 150 (no hx of DM), CBGs controlled with change back to clinimix 5/15 and moderate SSI use.  Monitor with added D5W infusion.  Electrolytes: Na elevated and increasing (electrolytes added back to TPN x1 day on 12/16 PM; last lasix given on 12/15).  Cl elevated, CO2 low, Mag  elevated.  K, Phos, and CorrCa wnl.   Renal:  SCr trending down (off CRRT 12/9a). good UOP with lasix.  I/O = +800 mL/24h; drain output decreased to 39 mL, stool output decreased to 140 mL; UOP decreased to 2270 mL  Weights trending down   LFTs:  AST, ALT, Alk Phos wnl, Tbili elevated but improved, not jaundiced  TGs:  Elevated, propofol stopped 12/7. TGs 233 (12/5), 400 (12/6), 281 (12/7), 322 (12/10), 231 (12/14), 216 (12/17)  Prealbumin:  9.3 (12/5), 9 (12/10), pending (12/17)  NUTRITIONAL GOALS  RD recs (12/14): 105-127g Protein, 1916-6060 Kcal  Based on published guidelines, while the patient meets ICU status the initiation of lipids is being delayed for 7 days or until transition out of ICU. Planned start date for lipids is 12/14.  Goal will be to meet 100% of the patient's protein needs and approximately 80% of their caloric needs.  Clinimix 5/15 @ 100 mL/hr and lipids 20% at 82m/hr over 12h to provide  120 grams protein (100% of goal) and 2064 kcal (100% of goal).  PLAN                                                                                                                          At 1800 today:  Change Clinimix 5/15 to NO electrolytes- containing formula at 1063mhr   Started lipids 12/14 (held for first week of ICU status) at 1559mr over 12h (dose reduction due to mildly inc trigs)  TPN to contain standard multivitamins and trace element daily.  IVF per MD - D5 at 50 ml/hr added for hyperNa until able to get free water per tube.  Continue CBGs q4h checks and SSI moderate scale.   TPN lab panels on Mondays & Thursdays.   BMET, Mag and Phos with AM labs.   ChrGretta ArabarmD, BCPS Pager [phone] 0520/17/2018 8:15 AM

## 2017-08-06 ENCOUNTER — Inpatient Hospital Stay (HOSPITAL_COMMUNITY): Payer: Non-veteran care

## 2017-08-06 DIAGNOSIS — R0682 Tachypnea, not elsewhere classified: Secondary | ICD-10-CM

## 2017-08-06 LAB — BASIC METABOLIC PANEL
BUN: 72 mg/dL — AB (ref 6–20)
CO2: 18 mmol/L — ABNORMAL LOW (ref 22–32)
Calcium: 8.8 mg/dL — ABNORMAL LOW (ref 8.9–10.3)
Creatinine, Ser: 2.08 mg/dL — ABNORMAL HIGH (ref 0.61–1.24)
GFR calc Af Amer: 37 mL/min — ABNORMAL LOW (ref >60)
GFR, EST NON AFRICAN AMERICAN: 32 mL/min — AB (ref >60)
GLUCOSE: 145 mg/dL — AB (ref 65–99)
POTASSIUM: 3.4 mmol/L — AB (ref 3.5–5.1)
SODIUM: 153 mmol/L — AB (ref 135–145)

## 2017-08-06 LAB — BLOOD GAS, ARTERIAL
ACID-BASE DEFICIT: 5.7 mmol/L — AB (ref 0.0–2.0)
Bicarbonate: 17.1 mmol/L — ABNORMAL LOW (ref 20.0–28.0)
DRAWN BY: 257701
O2 SAT: 96.6 %
Patient temperature: 98.6
pCO2 arterial: 25.2 mmHg — ABNORMAL LOW (ref 32.0–48.0)
pH, Arterial: 7.448 (ref 7.350–7.450)
pO2, Arterial: 86.6 mmHg (ref 83.0–108.0)

## 2017-08-06 LAB — GLUCOSE, CAPILLARY
GLUCOSE-CAPILLARY: 138 mg/dL — AB (ref 65–99)
GLUCOSE-CAPILLARY: 124 mg/dL — AB (ref 65–99)
GLUCOSE-CAPILLARY: 123 mg/dL — AB (ref 65–99)
GLUCOSE-CAPILLARY: 115 mg/dL — AB (ref 65–99)
GLUCOSE-CAPILLARY: 131 mg/dL — AB (ref 65–99)
Glucose-Capillary: 140 mg/dL — ABNORMAL HIGH (ref 65–99)
Glucose-Capillary: 140 mg/dL — ABNORMAL HIGH (ref 65–99)

## 2017-08-06 LAB — MAGNESIUM: Magnesium: 2.5 mg/dL — ABNORMAL HIGH (ref 1.7–2.4)

## 2017-08-06 LAB — CBC
HCT: 21 % — ABNORMAL LOW (ref 39.0–52.0)
Hemoglobin: 6.8 g/dL — CL (ref 13.0–17.0)
MCH: 29.4 pg (ref 26.0–34.0)
MCHC: 32.4 g/dL (ref 30.0–36.0)
MCV: 90.9 fL (ref 78.0–100.0)
PLATELETS: 924 10*3/uL — AB (ref 150–400)
RBC: 2.31 MIL/uL — AB (ref 4.22–5.81)
RDW: 15.7 % — ABNORMAL HIGH (ref 11.5–15.5)
WBC: 24.1 10*3/uL — AB (ref 4.0–10.5)

## 2017-08-06 LAB — PREPARE RBC (CROSSMATCH)

## 2017-08-06 LAB — PHOSPHORUS: Phosphorus: 3 mg/dL (ref 2.5–4.6)

## 2017-08-06 LAB — HEMOGLOBIN AND HEMATOCRIT, BLOOD
HEMATOCRIT: 22.9 % — AB (ref 39.0–52.0)
HEMOGLOBIN: 7.6 g/dL — AB (ref 13.0–17.0)

## 2017-08-06 MED ORDER — POTASSIUM CHLORIDE 10 MEQ/100ML IV SOLN
10.0000 meq | INTRAVENOUS | Status: AC
  Administered 2017-08-06 (×3): 10 meq via INTRAVENOUS
  Filled 2017-08-06 (×3): qty 100

## 2017-08-06 MED ORDER — FAT EMULSION 20 % IV EMUL
180.0000 mL | INTRAVENOUS | Status: AC
  Administered 2017-08-06: 180 mL via INTRAVENOUS
  Filled 2017-08-06: qty 250

## 2017-08-06 MED ORDER — SODIUM CHLORIDE 0.9 % IV SOLN
Freq: Once | INTRAVENOUS | Status: AC
  Administered 2017-08-06: 10:00:00 via INTRAVENOUS

## 2017-08-06 MED ORDER — SODIUM CHLORIDE 0.9 % IV SOLN
Freq: Once | INTRAVENOUS | Status: AC
  Administered 2017-08-09: 09:00:00 via INTRAVENOUS

## 2017-08-06 MED ORDER — SODIUM BICARBONATE 8.4 % IV SOLN
INTRAVENOUS | Status: DC
  Administered 2017-08-06 – 2017-08-08 (×3): via INTRAVENOUS
  Filled 2017-08-06 (×5): qty 150

## 2017-08-06 MED ORDER — TRACE MINERALS CR-CU-MN-SE-ZN 10-1000-500-60 MCG/ML IV SOLN
INTRAVENOUS | Status: AC
  Administered 2017-08-06: 17:00:00 via INTRAVENOUS
  Filled 2017-08-06: qty 2000
  Filled 2017-08-06: qty 2400

## 2017-08-06 NOTE — Progress Notes (Signed)
Nutrition Follow-up  DOCUMENTATION CODES:   Not applicable  INTERVENTION:  - Continue current diet order and advance as medically feasible. - Continue TPN per Pharmacy. - RD will monitor for additional nutrition-related needs.   NUTRITION DIAGNOSIS:   Inadequate oral intake related to inability to eat as evidenced by NPO status. -mainly ongoing with ability to have ice flavored with clear liquids.   GOAL:   Patient will meet greater than or equal to 90% of their needs -met with TPN regimen.  MONITOR:   PO intake, Diet advancement, Weight trends, Labs, Skin  ASSESSMENT:   61 y.o. male with no significant past medical history complaining of severely worsening abdominal distention, pain, nonbloody, nonbilious, non-coffee-ground emesis and lack of bowel movements over the course of the last 3 days no BM in 48 hours, feels like he needs to deficate.  He denies prior abdominal surgeries, no fever or chills.  Patient is unsure if he is passing flatus.  He is never had any liver issues he does drink regularly but has never had any seizures or hallucinations when he does not drink, he has not had any alcohol in approximately 1 week.  He is never seen a gastroenterologist.   Surgeries and events: 12/4:perforated viscus involving distal small bowel s/p ex lap with small bowel resection and abdominal wound vac placement; no anastomosis, peritonitis with E. Coli bacteremia. 12/6:re-exploration and creation of ileostomy, closure of abdominal wall; CRRT initiated 12/7:TPN initiated 12/9: CRRT stopped 12/12:subdiaphragmatic and subhepatic abscesses, large pelvic hematoma aand necrotic L liver lobe; re-exploration, drainage of abdominal abscess, evacuation of pelvic hematoma and drain placement 12/14: extubated, NGT removed 12/17: D5 IVF added outside of TPN d/t hypernatremia    12/18 Pt remains NPO but starting yesterday was allowed ice chips flavored with clear liquids. Pt with mumbled  speech and is difficult to understand; no family/visitors present. RN reports that last night pt ended up having 2 full cartons of Boost Breeze mixed with his ice chips and that he has been complaining of abdominal pain since that time. Pt nods to confirm this information. Weight continues to trend down. Will repeat NFPE at follow-up d/t this significant change.   Central line was removed and PICC placed in R brachial on 12/16. Pt receiving Clinimix 5/15 @ 100 mL/hr with 20% ILE @ 15 mL/hr x12 mL/hr. This regimen provides 120 grams of protein and 2064 kcal.   Medications reviewed; 1 mg IV folic acid/day, sliding scale Novolog, 40 mg IV Protonix/day, 100 mg IV thiamine/day.  Labs reviewed; CBGs: 138 and 140 mg/dL this AM, Na: 153 mmol/L, K: 3.4 mmol/L, Cl: >130 mmol/L, BUN: 72 mg/dL, creatinine: 2.08 mg/dL, Ca: 8.8 mg/dL, Mg: 2.5 mg/dL.  IVF: D5 @ 50 mL/hr (204 kcal)     12/14 - Pt was extubated at ~8:30 AM today and NGT remains in place.  - Weight -7 lbs/3.2 kg compared to weight yesterday.  - Estimated nutrition needs updated based on extubation and based on admission weight (79.4 kg).  - Per Pharmacy note, plan for Clinimix 5/15 (no lytes) @ 100 mL/hr with 20% ILE @ 15 mL/hr x12 hours.   Medication; 10 mEq IV KCl x5 runs today    12/13 - Pt remains off CRRT.  - He had emergent surgery yesterday.  - He remains intubated with NGT to LIS; no drainage in canister, clear yellow liquid throughout tubing.  - Per chart review, pt +2 lbs/0.9 kg compared to weight on 12/11; suspect this may be d/t  surgery yesterday.  - Pt continues with Clinimix E 5/20 @ 100 mL/hr which is providing 120 grams of protein, 2112 kcal.  - This meets 100% of estimated needs; low end of protein goal. - ILE remains on hold per ICU protocol.  Patient is currently intubated on ventilator support MV:16.8L/min Temp (24hrs), Avg:99.1 F (37.3 C), Min:98.5 F (36.9 C), Max:99.7 F (37.6 C) BP: 136/66 and  MAP: 85 Medication; 10 mEq IV KCl x5 runs today  Drip: Precedex @ 1.2 mcg/kg/hr.      Diet Order:  Diet NPO time specified Except for: Ice Chips TPN [facility]) Adult without lytes  EDUCATION NEEDS:   No education needs have been identified at this time  Skin:  Skin Assessment: Skin Integrity Issues: Skin Integrity Issues:: Incisions Incisions: Abdominal from surgeries 12/4, 12/6, 12/12  Last BM:  12/17  Height:   Ht Readings from Last 1 Encounters:  07/25/17 _0  (1.778 m)    Weight:   Wt Readings from Last 1 Encounters:  08/06/17 164 lb 0.4 oz (74.4 kg)    Ideal Body Weight:  75.45 kg  BMI:  Body mass index is 23.53 kg/m.  Estimated Nutritional Needs:   Kcal:  9409-0502 (25-28 kcal/kg)  Protein:  105-127 grams (1.3-1.6 grams/kg)  Fluid:  >2 L/day     Caleb Matin, MS, RD, LDN, [facility] Inpatient Clinical Dietitian Pager # [phone] After hours/weekend pager # [phone]

## 2017-08-06 NOTE — Progress Notes (Signed)
PULMONARY / CRITICAL CARE MEDICINE   Name: Caleb LaundryFrederick Yazzie MRN: [id number] DOB: 22-Jun-1956    ADMISSION DATE:  07/23/2017 CONSULTATION DATE: 07/23/2017   CHIEF COMPLAINT: Abdominal pain  BRIEF [age] y/o male former smoker with severe abdominal pain, and found to have free air in abdomen from perforated small bowel and ischemic cecum.  S/P resection of terminal ileum for ischemia.  Required re-look exploratory laparotomy with drainage of abdominal abscesses and evacuation of pelvic hematoma.    PMHx of ETOH, PE.  SUBJECTIVE:  Pt reports he feels SOB this am.  Denies fevers/chills.  Tmax 99 in last 24 hours.    VITAL SIGNS: BP 132/67   Pulse 94   Temp 99 F (37.2 C) (Axillary)   Resp (!) 43   Ht 5\' 10"  (1.778 m)   Wt 164 lb 0.4 oz (74.4 kg)   SpO2 99%   BMI 23.53 kg/m   INTAKE / OUTPUT: I/O last 3 completed shifts: In: 5552.9 [P.O.:360; I.V.:4757.9; IV Piggyback:435] Out: 3657 [Urine:3050; Drains:67; Stool:540]  PHYSICAL EXAMINATION:  General:  Chronically ill appearing male, tachypneic but not distressed  HEENT: MM pink/moist, no jvd PSY: calm Neuro: awake, alert, oriented to self / place / year.  Generalized weakness but moves all ext's. CV: s1s2 rrr, no m/r/g PULM: even/non-labored, lungs bilaterally coarse, rhonchi  GI: midline abd dressing c/d/i, ileostomy, JP drains x3 (two with serosanguinous, 1 with yellow / pus) Extremities: warm/dry, no edema  Skin: no rashes or lesions  PULMONARY Recent Labs  Lab 08/01/17 0452  PHART 7.362  PCO2ART 39.7  PO2ART 104  HCO3 21.9  O2SAT 97.7    CBC Recent Labs  Lab 08/04/17 0323 08/05/17 0620 08/06/17 0510  HGB 7.7* 7.3* 6.8*  HCT 23.3* 23.5* 21.0*  WBC 27.8* 23.3* 24.1*  PLT 986* 980* 924*    COAGULATION Recent Labs  Lab 07/31/17 0745  INR 1.58    CARDIAC  No results for input(s): TROPONINI in the last 168 hours. No results for input(s): PROBNP in the last 168 hours.   CHEMISTRY Recent Labs  Lab  08/01/17 0334  08/02/17 0502 08/03/17 1140 08/04/17 0323 08/05/17 0620 08/06/17 0510  NA 142   < > 145 146* 149* 155* 153*  K 3.6   < > 3.7 3.2* 3.2* 3.7 3.4*  CL 110   < > 114* 117* 119* 127* >130*  CO2 22   < > 22 24 22  20* 18*  GLUCOSE 205*   < > 156* 149* 150* 139* 145*  BUN 83*   < > 77* 61* 63* 65* 72*  CREATININE 2.95*   < > 2.50* 1.87* 1.75* 1.65* 2.08*  CALCIUM 7.6*   < > 7.8* 7.8* 8.3* 8.8* 8.8*  MG 2.2  --  1.9  --  2.0 2.5* 2.5*  PHOS 3.1  --  2.6  --  2.3* 3.3 3.0   < > = values in this interval not displayed.   Estimated Creatinine Clearance: 37.5 mL/min (A) (by C-G formula based on SCr of 2.08 mg/dL (H)).   LIVER Recent Labs  Lab 07/31/17 0745 08/01/17 0334 08/05/17 0620  AST  --  29 35  ALT  --  23 28  ALKPHOS  --  76 112  BILITOT  --  2.7* 2.3*  PROT  --  6.3* 7.5  ALBUMIN  --  1.8* 2.0*  INR 1.58  --   --      INFECTIOUS No results for input(s): LATICACIDVEN, PROCALCITON in the last 168  hours.   ENDOCRINE CBG (last 3)  Recent Labs    08/05/17 2340 08/06/17 0356 08/06/17 0743  GLUCAP 140* 138* 140*    IMAGING Dg Chest Port 1 View  Result Date: 08/06/2017 CLINICAL DATA:  Shortness of breath. EXAM: PORTABLE CHEST 1 VIEW COMPARISON:  Radiograph August 02, 2017. FINDINGS: Stable cardiomediastinal silhouette. No pneumothorax is noted. Endotracheal tube has been removed. Interval placement of right-sided PICC line with distal tip in expected position of cavoatrial junction. Mild bibasilar subsegmental atelectasis or infiltrates are noted. Probable small left pleural effusion is noted. Bony thorax is unremarkable. IMPRESSION: Interval placement of right-sided PICC line with tip in expected position of cavoatrial junction. Stable bibasilar subsegmental atelectasis or infiltrates are noted, with small left pleural effusion. Electronically Signed   By: Lupita RaiderJames  Green Jr, M.D.   On: 08/06/2017 07:47     CULTURES Blood 12/04: E coli Blood 12/10:  negative Surgical wound 12/12:  Pan sensitive E. Coli  ANTIBIOTICS Vancomycin 12/04 >> 12/04 Zosyn 12/04 >> 12/13 Anidulafungin 12/04 >> 12/17 Meropenem 12/14 >> 12/17 Zosyn 12/17 >>   EVENTS 12/04 Admit 12/06 Ileostomy, closure of abdominal wound 12/14 Laparotomy for abscess, evacuation of hematoma  12/16 Off precedex   LINES/TUBES Lt Minkler CVL 12/04 >> 12/16 RUE PICC 12/16 >>   ASSESSMENT / PLAN:  Peritonitis from perforated small bowel and ischemic cecum s/p SB ileocecectomy, ileostomy. Abdominal abscess s/p re-exploration 12/14. E coli bacteremia. P: Post-operative care, nutrition per [facility] Monitor off antifungal  Continue zosyn, discussed with ID 12/17 and could consider narrowing further to unasyn.  Hold for now.  Agitated delirium with hx of ETOH. P: Monitor off precedex  PRN ativan for agitation / anxiety   At Risk Atelectasis  Tachypnea  P: Add bicarbonate for non-gap acidosis  Pulmonary hygiene - IS, flutter  Follow intermittent CXR   A fib with RVR. P: Tele monitoring   Hypertension. P: Continue lopressor, hydralazine  PRN labetalol   Acute renal failure 2nd to ATN. Non Anion Gap Acidosis - gap corrected for albumin 9 Hypernatremia. Hypokalemia. P: Change IVF to D5W with bicarbonate 3 amps at 75 ml/hr Monitor sodium closely with bicarbonate gtt  Trend BMP / urinary output Replace electrolytes as indicated Avoid nephrotoxic agents, ensure adequate renal perfusion  Anemia - suspect related critical illness / phlebotomy. P: Transfuse 1 unit PRBC (total) 12/18 Follow CBC   Hyperglycemia. P: SSI   Deconditioning. P: PT efforts   DVT prophylaxis: SQ heparin SUP: Protonix Diet: ice chips  + TNA Goals of care: Full code   Canary BrimBrandi Ever Gustafson, NP-C [facility] Pgr: [phone] or if no answer [phone] 08/06/2017, 8:55 AM

## 2017-08-06 NOTE — Progress Notes (Signed)
eLink Physician-Brief Progress Note Patient Name: Lamar LaundryFrederick Biskup DOB: Feb 26, 1956 MRN: [id number]   Date of Service  08/06/2017  HPI/Events of Note  Anemia - Hgb = 6.8.  eICU Interventions  Will transfuse 1 unit PRBC.     Intervention Category Major Interventions: Other:  [name],Cypress Hinkson Eugene 08/06/2017, 6:19 AM

## 2017-08-06 NOTE — Progress Notes (Signed)
OT Cancellation Note  Patient Details Name: Caleb Singh MRN: [id number] DOB: 1955/10/21   Cancelled Treatment:    Reason Eval/Treat Not Completed: Medical issues which prohibited therapy.  Noted pt with Hgb of 6.8 and increased RR. Will check back tomorrow.  [place] 08/06/2017, 7:59 AM  Caleb Singh, [name] [phone] 08/06/2017

## 2017-08-06 NOTE — Progress Notes (Signed)
CRITICAL VALUE ALERT  Critical Value:  Chloride > 130  Date & Time Notied:  08/06/2017 [id number]  Provider Notified: na in range patient was yesterday   Orders Received/Actions taken: na

## 2017-08-06 NOTE — Progress Notes (Signed)
PHARMACY - ADULT TOTAL PARENTERAL NUTRITION CONSULT NOTE   Pharmacy Consult for TPN Indication: prolonged ileus  Patient Measurements: Body mass index is 23.53 kg/m. Filed Weights   08/04/17 0440 08/05/17 0600 08/06/17 0500  Weight: 170 lb 6.7 oz (77.3 kg) 165 lb 5.5 oz (75 kg) 164 lb 0.4 oz (74.4 kg)    HPI: 45 YOM admitted on 12/4 with c/o severe and worsening abdominal distention, pain, nonbloody, nonbilious, non-coffee-ground emesis and lack of bowel movements x3 days.  No significant PMH.  He was found to have perforated small bowel s/p repair in OR, and remains intubated/sedated on pressors postop.  Pharmacy is consulted to dose TPN.  Significant events:  12/4 SB resection, open wound VAC.  NG tube placed 12/5 Started CRRT 12/6 Repeat OR on for washout, ileocecectomy, creation of end ileostomy,closure of abdominal wall 12/9 CRRT off 12/9 am 12/12 Exploratory laparotomy, drainage of abdominal abscess and evacuation of pelvic hematoma, RUQ drain, and left lateral drain placement. Findings per surgery note: Subdiaphragmatic and subhepatic abscesses.Large organizing pelvic hematoma. Apparent necrotic tissue left lobe of the liver. 12/14 Extubated 12/17 Some drainage through his ileostomy, but no bowel sounds.  No diet, may have ice chips, sips of clears. 12/18 Transfuse PRBC, start bicarb, cont sips of clears only  Insulin requirements past 24 hours: 13 units SSI/24h  Current Nutrition: NPO except for ice chips, sips of clears.  TPN at goal rate  IVF: none  Central access: CVC triple lumen placed 12/4 TPN start date: 12/7  ASSESSMENT                                                                                                          Today:   Glucose:  goal < 150 (no hx of DM), CBGs controlled with change back to clinimix 5/15 and moderate SSI use.  Monitor with added D5W infusion.  Range 124-182.  Electrolytes: Na elevated at 153 - monitor closely with NaBicarb  infusion, Cl > 130, CO2 low, K low.  (electrolytes removed from TPN on 12/17).   Mag elevated.  Phos, and CorrCa wnl.   Renal:  SCr increased to 2.08 (off CRRT 12/9a).   I/O = +1913m/24h; drain output 43 mL, stool output inc to 500 mL; UOP decreased to 15061m Weights trending down.  Admission wt 79.4 kg, currently 74.4 kg  LFTs:  AST, ALT, Alk Phos wnl, Tbili elevated but improved, not jaundiced  TGs:  Elevated, propofol stopped 12/7. TGs 233 (12/5), 400 (12/6), 281 (12/7), 322 (12/10), 231 (12/14), 216 (12/17)  Prealbumin:  9.3 (12/5), 9 (12/10), 18.8 (12/17)  NUTRITIONAL GOALS  RD recs (12/14): 105-127g Protein, 1836-7255 Kcal  Based on published guidelines, while the patient meets ICU status the initiation of lipids is being delayed for 7 days or until transition out of ICU. Planned start date for lipids is 12/14.  Goal will be to meet 100% of the patient's protein needs and approximately 80% of their caloric needs.  Clinimix 5/15 @ 100 mL/hr and lipids 20% at 51m/hr over 12h to provide  120 grams protein (100% of goal) and 2064 kcal (100% of goal).  PLAN                                                                                                                         Now: KCl 10 mEq IV x4 runs  At 1800 today:  Continue Clinimix 5/15 NO electrolyte formula at 1078mhr   Started lipids 12/14 (held for first week of ICU status) at 1583mr over 12h (dose reduction due to mildly inc trigs)  TPN to contain standard multivitamins and trace element daily.  IVF per MD - NaBicarb/D5 at 75 ml/hr  Continue CBGs q4h checks and SSI moderate scale.   TPN lab panels on Mondays & Thursdays.   BMET, Mag and Phos with AM labs.   ChrGretta ArabarmD, BCPS Pager [phone]/18/2018 8:52 AM

## 2017-08-06 NOTE — Consult Note (Signed)
WOC Nurse ostomy follow up Stoma type/location: RLQ, end ileostomy Stomal assessment/size: oval shaped stoma, with mucocutaneous separation from 6-11 oclock. Stoma is budded and pink. Peristomal assessment: peristomal MARSI (Medical Adhesive Related Skin Injury) is healing, dry, not weeping Treatment options for stomal/peristomal skin: using ostomy powder and skin barrier wipe to fill separation and to address the slight skin irritation along the medial aspect of the stoma.  Also covered this area with ostomy barrier ring.  Output liquid green Ostomy pouching: 2pc. 2 3/4" with ostomy barrier ring  Dr. Doylene Canardonner in at [facility], she is aware of the separation noted above.  I did not appreciate any stool or drainage from the areas of skin separation   Education provided:  None, no family at bedside and patient is not doing well this am.  He is tachypneic today and is barely able to speak.  Bedside nursing staff aware.  ABG being drawn when [facility] enters the room.  Enrolled patient in [employer] [employer] Discharge program: No  WOC Nurse wound follow up Wound type: surgical Measurement: 19cm x 3.5cm x 1.5cm  Wound bed: 50% pink at wound edges/ 50% yellow/brown soft slough in the base with several visible sutures Dr. Fredricka Bonineonnor at bedside to assess wound  Drainage (amount, consistency, odor) minimal, no odor, serosanguinous  Periwound: intact, 2 drains in the right upper and lower quads.  Dressing procedure/placement/frequency: Replaced dressing with saline moist 4x4 gauze, fluffed to fill wound bed, covered with ABD pad and secured with tape.   WOC nurse team will follow along for support with wound and ostomy care. Keaton Stirewalt [facility] MSN,RN,CWOCN, CNS, [employer] [phone]

## 2017-08-06 NOTE — Progress Notes (Signed)
CRITICAL VALUE STICKER  CRITICAL VALUE: hgb 6.8  RECEIVER (on-site recipient of call): Famous Eisenhardt  DATE & TIME NOTIFIED: 0600 08/06/2017  MESSENGER (representative from lab):  MD NOTIFIED: Elink  TIME OF NOTIFICATION:0608  RESPONSE: Vinnie LangtonGretchen notify MD

## 2017-08-06 NOTE — Progress Notes (Signed)
6 Days Post-Op    CC: Abdominal pain  Subjective: Awake but having trouble breathing. Denies abdominal pain. Reportedly had pain after drinking boost yesterday.  Objective: Vital signs in last 24 hours: Temp:  [97.6 F (36.4 C)-99 F (37.2 C)] 98.2 F (36.8 C) (12/18 1021) Pulse Rate:  [87-112] 103 (12/18 1021) Resp:  [33-51] 43 (12/18 1021) BP: (113-171)/(35-95) 139/71 (12/18 1021) SpO2:  [92 %-100 %] 99 % (12/18 1021) Weight:  [74.4 kg (164 lb 0.4 oz)] 74.4 kg (164 lb 0.4 oz) (12/18 0500) Last BM Date: 08/05/17  E coli from blood culture and abdominal cultures Intake/Output from previous day: 12/17 0701 - 12/18 0700 In: 3925.4 [P.O.:360; I.V.:3490.4; IV Piggyback:75] Out: 2043 [Urine:1500; Drains:43; Stool:500] Intake/Output this shift: Total I/O In: 63.3 [I.V.:63.3] Out: 150 [Stool:150]  General appearance: alert, cooperative, tachypneic.  Resp: Clear anterior exam. Cardio: Sinus tachycardia his heart rates up some. GI: Open abdomen looks stable.  He has some necrosis at the base.  Ostomy pink and productive.  Drain #1 still has purulent fluid within it.  Drain #2 is serosanguineous.  Lab Results:  Recent Labs    08/05/17 0620 08/06/17 0510  WBC 23.3* 24.1*  HGB 7.3* 6.8*  HCT 23.5* 21.0*  PLT 980* 924*    BMET Recent Labs    08/05/17 0620 08/06/17 0510  NA 155* 153*  K 3.7 3.4*  CL 127* >130*  CO2 20* 18*  GLUCOSE 139* 145*  BUN 65* 72*  CREATININE 1.65* 2.08*  CALCIUM 8.8* 8.8*   PT/INR No results for input(s): LABPROT, INR in the last 72 hours.  Recent Labs  Lab 08/01/17 0334 08/05/17 0620  AST 29 35  ALT 23 28  ALKPHOS 76 112  BILITOT 2.7* 2.3*  PROT 6.3* 7.5  ALBUMIN 1.8* 2.0*     Lipase     Component Value Date/Time   LIPASE 47 07/23/2017 1455     Medications: . chlorhexidine gluconate (MEDLINE KIT)  15 mL Mouth Rinse BID  . Chlorhexidine Gluconate Cloth  6 each Topical Daily  . feeding supplement  1 Container Oral TID BM   . folic acid  1 mg Intravenous Daily  . heparin injection (subcutaneous)  5,000 Units Subcutaneous Q8H  . hydrALAZINE  20 mg Intravenous Q4H  . insulin aspart  0-15 Units Subcutaneous Q4H  . mouth rinse  15 mL Mouth Rinse QID  . metoprolol tartrate  5 mg Intravenous Q6H  . pantoprazole (PROTONIX) IV  40 mg Intravenous Q24H  . sodium chloride flush  10-40 mL Intracatheter Q12H  . thiamine injection  100 mg Intravenous Daily   . sodium chloride    . piperacillin-tazobactam (ZOSYN)  IV 3.375 g (08/06/17 1020)  .  sodium bicarbonate  infusion 1000 mL    . TPN (CLINIMIX) Adult without lytes 100 mL/hr at 08/05/17 1839   Anti-infectives (From admission, onward)   Start     Dose/Rate Route Frequency Ordered Stop   08/05/17 1000  piperacillin-tazobactam (ZOSYN) IVPB 3.375 g     3.375 g 12.5 mL/hr over 240 Minutes Intravenous Every 8 hours 08/05/17 0946     08/02/17 1400  meropenem (MERREM) 1 g in sodium chloride 0.9 % 100 mL IVPB  Status:  Discontinued     1 g 200 mL/hr over 30 Minutes Intravenous Every 12 hours 08/02/17 1111 08/05/17 0945   07/29/17 0600  piperacillin-tazobactam (ZOSYN) IVPB 3.375 g  Status:  Discontinued     3.375 g 12.5 mL/hr over 240 Minutes Intravenous  Every 8 hours 07/29/17 0518 08/02/17 1036   07/25/17 2000  vancomycin (VANCOCIN) IVPB 1000 mg/200 mL premix  Status:  Discontinued     1,000 mg 200 mL/hr over 60 Minutes Intravenous Every 48 hours 07/23/17 2042 07/24/17 0956   07/24/17 2200  piperacillin-tazobactam (ZOSYN) 3.375 g in dextrose 5 % 50 mL IVPB  Status:  Discontinued     3.375 g 100 mL/hr over 30 Minutes Intravenous Every 6 hours 07/24/17 2006 07/29/17 0518   07/24/17 2100  anidulafungin (ERAXIS) 100 mg in sodium chloride 0.9 % 100 mL IVPB  Status:  Discontinued     100 mg 78 mL/hr over 100 Minutes Intravenous Every 24 hours 07/23/17 2039 08/05/17 0945   07/24/17 0000  anidulafungin (ERAXIS) 100 mg in sodium chloride 0.9 % 100 mL IVPB  Status:   Discontinued     100 mg 78 mL/hr over 100 Minutes Intravenous Every 24 hours 07/23/17 2038 07/23/17 2039   07/23/17 2200  piperacillin-tazobactam (ZOSYN) IVPB 3.375 g  Status:  Discontinued     3.375 g 12.5 mL/hr over 240 Minutes Intravenous Every 8 hours 07/23/17 2043 07/24/17 2006   07/23/17 2100  anidulafungin (ERAXIS) 200 mg in sodium chloride 0.9 % 200 mL IVPB     200 mg 78 mL/hr over 200 Minutes Intravenous  Once 07/23/17 2038 07/24/17 0239   07/23/17 2100  vancomycin (VANCOCIN) 1,500 mg in sodium chloride 0.9 % 500 mL IVPB     1,500 mg 250 mL/hr over 120 Minutes Intravenous  Once 07/23/17 2041 07/23/17 2317   07/23/17 1515  piperacillin-tazobactam (ZOSYN) IVPB 3.375 g     3.375 g 100 mL/hr over 30 Minutes Intravenous  Once 07/23/17 1511 07/23/17 1611      Assessment/Plan Perforated distal small boweland ischemia of cecum 1.S/Pexploratory laparotomy, small bowel resection without anastomosis, placement of open wound VAC system 07/23/17 Dr. Randall Hiss Wilson/Dr. Fanny Skates 2. S/p Reexploration of abdomen, ileocecectomy, creation of end ileostomy,closure of abdominal wall, 07/25/17, Dr. [name] - Post op ileus  3. CT 12/11: ? Recurrent bowel perforation, ? Left hepatic abscess, ? RLL penumonia, mild dehiscence of anterior abdominal wound. Exploratory laparotomy, drainage of abdominal abscess and evacuation of pelvic hematoma, RUQ drain, and left lateral drain placement, 07/31/17, Dr. Excell Seltzer (Findings: Subdiaphragmatic and subhepatic abscesses.Large organizing pelvic hematoma. Apparent necrotic tissue left lobe of the liver.)    Sepsis/shock -vasopressors off - WBC rising Bacteremia -  Enterobacteriaceae + E Coli Specimen Description ABSCESS ABDOMEN   Special Requests NONE   Gram Stain RARE WBC PRESENT,BOTH PMN AND MONONUCLEAR  FEW GRAM NEGATIVE RODS  RARE GRAM POSITIVE RODS  Performed at [facility] [address], [place], [place]  [address]     Culture MODERATE ESCHERICHIA COLI  NO ANAEROBES ISOLATED; CULTURE IN PROGRESS FOR 5 DAYS       AF - Asystole 07/29/17 - no Code Blue -Cardizem currently off E coliBacteremia - Zosyn Acute respiratory failure -  Acute renal failure-CRRT discontinued 07/27/17 Malnutrition/Decondtioning - Prealbumin <5 Anemia - transfused 1 Unit PRBC12/8/18,07/29/17, and12/11/18 - 2 PRBC Postop ileus History of heavy alcohol use  History of tobacco use FEN: TPN/IV fluids -  Ice chips with sips of clears [id number] 07/23/17- 07/24/17,Zosyn 07/23/17=>>day 13, anidulafungin 07/23/17 =>>day13 [id number] -  Heparin Follow-up:Dr. Greer Pickerel   Plan: Continue medical management as per primary.  Continue 3 times daily dressing changes.  Would continue sips of clears only. Ostomy is working, abdomen is benign.  LOS: 14 days    Caleb Singh 08/06/2017 [phone]

## 2017-08-07 ENCOUNTER — Inpatient Hospital Stay (HOSPITAL_COMMUNITY): Payer: Non-veteran care

## 2017-08-07 ENCOUNTER — Encounter (HOSPITAL_COMMUNITY): Payer: Self-pay | Admitting: Radiology

## 2017-08-07 DIAGNOSIS — L8990 Pressure ulcer of unspecified site, unspecified stage: Secondary | ICD-10-CM

## 2017-08-07 LAB — COMPREHENSIVE METABOLIC PANEL
ALBUMIN: 1.9 g/dL — AB (ref 3.5–5.0)
ALK PHOS: 105 U/L (ref 38–126)
ALT: 31 U/L (ref 17–63)
AST: 36 U/L (ref 15–41)
Anion gap: 4 — ABNORMAL LOW (ref 5–15)
BUN: 68 mg/dL — ABNORMAL HIGH (ref 6–20)
CALCIUM: 8.4 mg/dL — AB (ref 8.9–10.3)
CO2: 23 mmol/L (ref 22–32)
CREATININE: 2.05 mg/dL — AB (ref 0.61–1.24)
Chloride: 128 mmol/L — ABNORMAL HIGH (ref 101–111)
GFR calc Af Amer: 38 mL/min — ABNORMAL LOW (ref >60)
GFR calc non Af Amer: 33 mL/min — ABNORMAL LOW (ref >60)
Glucose, Bld: 149 mg/dL — ABNORMAL HIGH (ref 65–99)
Potassium: 3.6 mmol/L (ref 3.5–5.1)
SODIUM: 155 mmol/L — AB (ref 135–145)
Total Bilirubin: 2.4 mg/dL — ABNORMAL HIGH (ref 0.3–1.2)
Total Protein: 7.3 g/dL (ref 6.5–8.1)

## 2017-08-07 LAB — GLUCOSE, CAPILLARY
GLUCOSE-CAPILLARY: 138 mg/dL — AB (ref 65–99)
GLUCOSE-CAPILLARY: 123 mg/dL — AB (ref 65–99)
Glucose-Capillary: 97 mg/dL (ref 65–99)
Glucose-Capillary: 120 mg/dL — ABNORMAL HIGH (ref 65–99)

## 2017-08-07 LAB — CBC
HEMATOCRIT: 21.7 % — AB (ref 39.0–52.0)
Hemoglobin: 7.1 g/dL — ABNORMAL LOW (ref 13.0–17.0)
MCH: 29.3 pg (ref 26.0–34.0)
MCHC: 32.7 g/dL (ref 30.0–36.0)
MCV: 89.7 fL (ref 78.0–100.0)
PLATELETS: 703 10*3/uL — AB (ref 150–400)
RBC: 2.42 MIL/uL — AB (ref 4.22–5.81)
RDW: 16.7 % — AB (ref 11.5–15.5)
WBC: 21.5 10*3/uL — AB (ref 4.0–10.5)

## 2017-08-07 LAB — BLOOD GAS, ARTERIAL
ACID-BASE DEFICIT: 4.1 mmol/L — AB (ref 0.0–2.0)
BICARBONATE: 19.7 mmol/L — AB (ref 20.0–28.0)
DRAWN BY: 225631
O2 Content: 2 L/min
O2 Saturation: 98 %
PATIENT TEMPERATURE: 98.7
PH ART: 7.403 (ref 7.350–7.450)
pCO2 arterial: 32.3 mmHg (ref 32.0–48.0)
pO2, Arterial: 112 mmHg — ABNORMAL HIGH (ref 83.0–108.0)

## 2017-08-07 LAB — MAGNESIUM: Magnesium: 2.3 mg/dL (ref 1.7–2.4)

## 2017-08-07 LAB — BASIC METABOLIC PANEL
Anion gap: 4 — ABNORMAL LOW (ref 5–15)
BUN: 71 mg/dL — AB (ref 6–20)
CALCIUM: 8.3 mg/dL — AB (ref 8.9–10.3)
CHLORIDE: 127 mmol/L — AB (ref 101–111)
CO2: 23 mmol/L (ref 22–32)
CREATININE: 2.24 mg/dL — AB (ref 0.61–1.24)
GFR calc non Af Amer: 29 mL/min — ABNORMAL LOW (ref >60)
GFR, EST AFRICAN AMERICAN: 34 mL/min — AB (ref >60)
Glucose, Bld: 124 mg/dL — ABNORMAL HIGH (ref 65–99)
Potassium: 3.5 mmol/L (ref 3.5–5.1)
Sodium: 154 mmol/L — ABNORMAL HIGH (ref 135–145)

## 2017-08-07 LAB — PHOSPHORUS: PHOSPHORUS: 3.9 mg/dL (ref 2.5–4.6)

## 2017-08-07 LAB — PATHOLOGIST SMEAR REVIEW

## 2017-08-07 MED ORDER — DEXTROSE 5 % IV SOLN
INTRAVENOUS | Status: DC
  Administered 2017-08-07 – 2017-08-10 (×4): via INTRAVENOUS

## 2017-08-07 MED ORDER — POTASSIUM CHLORIDE 10 MEQ/100ML IV SOLN
10.0000 meq | INTRAVENOUS | Status: AC
  Administered 2017-08-07 (×4): 10 meq via INTRAVENOUS
  Filled 2017-08-07 (×4): qty 100

## 2017-08-07 MED ORDER — FAT EMULSION 20 % IV EMUL
180.0000 mL | INTRAVENOUS | Status: AC
  Administered 2017-08-07: 180 mL via INTRAVENOUS
  Filled 2017-08-07: qty 250

## 2017-08-07 MED ORDER — TRACE MINERALS CR-CU-MN-SE-ZN 10-1000-500-60 MCG/ML IV SOLN
INTRAVENOUS | Status: AC
  Administered 2017-08-07: 18:00:00 via INTRAVENOUS
  Filled 2017-08-07: qty 2000

## 2017-08-07 MED ORDER — HEPARIN SODIUM (PORCINE) 5000 UNIT/ML IJ SOLN
5000.0000 [IU] | Freq: Three times a day (TID) | INTRAMUSCULAR | Status: DC
  Administered 2017-08-07 (×3): 5000 [IU] via SUBCUTANEOUS
  Filled 2017-08-07 (×2): qty 1

## 2017-08-07 NOTE — Progress Notes (Signed)
7 Days Post-Op    CC: abdominal pain  Subjective: He looks terrible this morning.  Respiratory rates been in the 30s-40 range.  They gave him some sedation overnight at the [facility] direction.  He is not talking at all this AM.  He has a 1 cm area on his scrotum that has lost skin, and I had them put some barrier cream on it to protect the site. He was kept NPO yesterday, because 2 cans of Boost day prior led to abdominal distension.  His ostomy is working.  With his mental status I would not start PO's yet.  His mid line wound looks like it is about to come apart, I have a pictures below.  He is on TID dressing changes now.  Objective: Vital signs in last 24 hours: Temp:  [98.1 F (36.7 C)-99.8 F (37.7 C)] 99.5 F (37.5 C) (12/19 0400) Pulse Rate:  [27-121] 97 (12/19 0700) Resp:  [21-45] 35 (12/19 0700) BP: (125-180)/(52-85) 154/60 (12/19 0700) SpO2:  [95 %-100 %] 99 % (12/19 0700) Weight:  [76.4 kg (168 lb 6.9 oz)] 76.4 kg (168 lb 6.9 oz) (12/19 0221) Last BM Date: 08/06/17 3676 IV 2575 urine Drains 10 Stool 490 NPO Afebrile, BP up Tachycardia is better, RR remains up in 30-40 range ABG OK Na 154 Cl 127 Creatinine is 2.24 going back up WBC 21.5 H/H 7.1/21.7 Last CT 07/30/17  Intake/Output from previous day: 12/18 0701 - 12/19 0700 In: 3676.7 [I.V.:3026.7; Blood:300; IV Piggyback:350] Out: 2725 [Urine:2575; Drains:10; Stool:490] Intake/Output this shift: No intake/output data recorded.  General appearance: sedated and not answering questions, no response to removing tape even when hair is involved. Resp: clear to auscultation bilaterally GI: some distension, not much, few BS, ostomy is working well, liquid stool is in the bag.  Midline wound looks like it could dehisce at any time.  No granulation at the base at all. Pictures below.       Lab Results:  Recent Labs    08/06/17 0510 08/06/17 1458 08/07/17 0500  WBC 24.1*  --  21.5*  HGB 6.8* 7.6* 7.1*  HCT 21.0*  22.9* 21.7*  PLT 924*  --  703*    BMET Recent Labs    08/06/17 0510 08/07/17 0500  NA 153* 154*  K 3.4* 3.5  CL >130* 127*  CO2 18* 23  GLUCOSE 145* 124*  BUN 72* 71*  CREATININE 2.08* 2.24*  CALCIUM 8.8* 8.3*   PT/INR No results for input(s): LABPROT, INR in the last 72 hours.  Recent Labs  Lab 08/01/17 0334 08/05/17 0620  AST 29 35  ALT 23 28  ALKPHOS 76 112  BILITOT 2.7* 2.3*  PROT 6.3* 7.5  ALBUMIN 1.8* 2.0*     Lipase     Component Value Date/Time   LIPASE 47 07/23/2017 1455     Medications: . chlorhexidine gluconate (MEDLINE KIT)  15 mL Mouth Rinse BID  . Chlorhexidine Gluconate Cloth  6 each Topical Daily  . feeding supplement  1 Container Oral TID BM  . folic acid  1 mg Intravenous Daily  . heparin injection (subcutaneous)  5,000 Units Subcutaneous Q8H  . hydrALAZINE  20 mg Intravenous Q4H  . insulin aspart  0-15 Units Subcutaneous Q4H  . mouth rinse  15 mL Mouth Rinse QID  . metoprolol tartrate  5 mg Intravenous Q6H  . pantoprazole (PROTONIX) IV  40 mg Intravenous Q24H  . sodium chloride flush  10-40 mL Intracatheter Q12H  . thiamine injection  100 mg Intravenous Daily   . sodium chloride    . piperacillin-tazobactam (ZOSYN)  IV Stopped (08/07/17 0416)  .  sodium bicarbonate  infusion 1000 mL 75 mL/hr at 08/07/17 0400  . TPN (CLINIMIX) Adult without lytes 100 mL/hr at 08/07/17 0400   Anti-infectives (From admission, onward)   Start     Dose/Rate Route Frequency Ordered Stop   08/05/17 1000  piperacillin-tazobactam (ZOSYN) IVPB 3.375 g     3.375 g 12.5 mL/hr over 240 Minutes Intravenous Every 8 hours 08/05/17 0946     08/02/17 1400  meropenem (MERREM) 1 g in sodium chloride 0.9 % 100 mL IVPB  Status:  Discontinued     1 g 200 mL/hr over 30 Minutes Intravenous Every 12 hours 08/02/17 1111 08/05/17 0945   07/29/17 0600  piperacillin-tazobactam (ZOSYN) IVPB 3.375 g  Status:  Discontinued     3.375 g 12.5 mL/hr over 240 Minutes Intravenous  Every 8 hours 07/29/17 0518 08/02/17 1036   07/25/17 2000  vancomycin (VANCOCIN) IVPB 1000 mg/200 mL premix  Status:  Discontinued     1,000 mg 200 mL/hr over 60 Minutes Intravenous Every 48 hours 07/23/17 2042 07/24/17 0956   07/24/17 2200  piperacillin-tazobactam (ZOSYN) 3.375 g in dextrose 5 % 50 mL IVPB  Status:  Discontinued     3.375 g 100 mL/hr over 30 Minutes Intravenous Every 6 hours 07/24/17 2006 07/29/17 0518   07/24/17 2100  anidulafungin (ERAXIS) 100 mg in sodium chloride 0.9 % 100 mL IVPB  Status:  Discontinued     100 mg 78 mL/hr over 100 Minutes Intravenous Every 24 hours 07/23/17 2039 08/05/17 0945   07/24/17 0000  anidulafungin (ERAXIS) 100 mg in sodium chloride 0.9 % 100 mL IVPB  Status:  Discontinued     100 mg 78 mL/hr over 100 Minutes Intravenous Every 24 hours 07/23/17 2038 07/23/17 2039   07/23/17 2200  piperacillin-tazobactam (ZOSYN) IVPB 3.375 g  Status:  Discontinued     3.375 g 12.5 mL/hr over 240 Minutes Intravenous Every 8 hours 07/23/17 2043 07/24/17 2006   07/23/17 2100  anidulafungin (ERAXIS) 200 mg in sodium chloride 0.9 % 200 mL IVPB     200 mg 78 mL/hr over 200 Minutes Intravenous  Once 07/23/17 2038 07/24/17 0239   07/23/17 2100  vancomycin (VANCOCIN) 1,500 mg in sodium chloride 0.9 % 500 mL IVPB     1,500 mg 250 mL/hr over 120 Minutes Intravenous  Once 07/23/17 2041 07/23/17 2317   07/23/17 1515  piperacillin-tazobactam (ZOSYN) IVPB 3.375 g     3.375 g 100 mL/hr over 30 Minutes Intravenous  Once 07/23/17 1511 07/23/17 1611      Assessment/Plan Perforated distal small boweland ischemia of cecum 1.S/Pexploratory laparotomy, small bowel resection without anastomosis, placement of open wound VAC system 07/23/17 Dr. Randall Hiss Wilson/Dr. Fanny Skates 2. S/p Reexploration of abdomen, ileocecectomy, creation of end ileostomy,closure of abdominal wall, 07/25/17, Dr. [name] - Post op ileus  3. CT 12/11: ? Recurrent bowel perforation, ? Left  hepatic abscess, ? RLL penumonia, mild dehiscence of anterior abdominal wound. Exploratory laparotomy, drainage of abdominal abscess and evacuation of pelvic hematoma, RUQ drain, and left lateral drain placement, 07/31/17, Dr. Excell Seltzer (Findings: Subdiaphragmatic and subhepatic abscesses.Large organizing pelvic hematoma. Apparent necrotic tissue left lobe of the liver.)    Sepsis/shock -vasopressors off - WBC rising Bacteremia -  Enterobacteriaceae + E Coli Specimen Description ABSCESS ABDOMEN   Special Requests NONE   Gram Stain RARE WBC PRESENT,BOTH PMN AND MONONUCLEAR  FEW GRAM NEGATIVE RODS  RARE GRAM POSITIVE RODS  Performed at [facility], [place] [address], [place], [place] [address]     Culture MODERATE ESCHERICHIA COLI  NO ANAEROBES ISOLATED; CULTURE IN PROGRESS FOR 5 DAYS       AF - Asystole 07/29/17 - no Code blue-Cardizem turned off E coliBacteremia - Zosyn Acute respiratory failure - creatinine is up today 2.24 Acute renal failure-CRRT discontinued 07/27/17 Malnutrition/Decondtioning - Prealbumin <5 Anemia - transfused 1 Unit PRBC12/8/18,07/29/17, and12/11/18 - 2 PRBC (H/H today: 7.1/21.7) Hypernatremia -  Na 154 Postop ileus History of heavy alcohol use  History of tobacco use FEN: TPN/IV fluids -  Ice chips with sips of clears [id number] 07/23/17- 07/24/17,Zosyn 07/23/17- 12/14, anidulafungin 07/23/17 - 12/17, Meropenem 12/14 - 12/17 =>> Zosyn restarted 12/17 =>> day 3 [id number] -  Heparin Follow-up:Dr. Greer Pickerel    Plan:  I would like to restart PO's but with his sedation and respiratory status that is not possible.  His last CT was 07/30/17, he is afebrile, but WBC remains in the 20K range.        LOS: 15 days    Rodgers Likes 08/07/2017 [phone]

## 2017-08-07 NOTE — Progress Notes (Signed)
Pt diaphoretic/ tachypnic/ tachycardic  upon assessment. Pt has been breathing 30-40 BPM since initial assessment.  Relayed symptoms to E-[facility] MD, orders given for STAT ABG as well as  increased pain/ anxiety management. MD states that Pt needs incentive spirometry and  increased pain/ anxiety management to help with shallow breathing. Pt has been refusing incentive spirometry. RN will continue to monitor.

## 2017-08-07 NOTE — Progress Notes (Signed)
PULMONARY / CRITICAL CARE MEDICINE   Name: Caleb Singh MRN: [id number] DOB: 1956/06/04    ADMISSION DATE:  07/23/2017 CONSULTATION DATE: 07/23/2017   CHIEF COMPLAINT: Abdominal pain  BRIEF SUMMARY: [age] y/o male former smoker with severe abdominal pain, and found to have free air in abdomen from perforated small bowel and ischemic cecum.  S/P resection of terminal ileum for ischemia.  Required re-look exploratory laparotomy with drainage of abdominal abscesses and evacuation of pelvic hematoma.    PMHx of ETOH, PE.  SUBJECTIVE:   RN reports pt received ativan overnight, somewhat sedate this am.  Remains tachypneic.  Low K+, replaced this am.     VITAL SIGNS: BP (!) 154/60   Pulse 97   Temp 99.5 F (37.5 C) (Axillary)   Resp (!) 35   Ht 5\' 10"  (1.778 m)   Wt 168 lb 6.9 oz (76.4 kg)   SpO2 99%   BMI 24.17 kg/m   INTAKE / OUTPUT: I/O last 3 completed shifts: In: 5737.1 [P.O.:120; I.V.:4917.1; Blood:300; IV Piggyback:400] Out: 3888 [Urine:3275; Drains:23; Stool:590]  PHYSICAL EXAMINATION:  General: ill appearing male in NAD, sitting up in bed  HEENT: MM pink/moist, no jvd Neuro: sedate, MAE, appears uncomfortable, generalized weakness  CV: s1s2 rrr, no m/r/g PULM: even/non-labored, lungs bilaterally coarse GI: protuberant, midline dressing c/d/i, JP drains x3, ileostomy Extremities: warm/dry, trace BLE edema  Skin: no rashes or lesions  PULMONARY Recent Labs  Lab 08/01/17 0452 08/06/17 0957 08/07/17 0024  PHART 7.362 7.448 7.403  PCO2ART 39.7 25.2* 32.3  PO2ART 104 86.6 112*  HCO3 21.9 17.1* 19.7*  O2SAT 97.7 96.6 98.0    CBC Recent Labs  Lab 08/05/17 0620 08/06/17 0510 08/06/17 1458 08/07/17 0500  HGB 7.3* 6.8* 7.6* 7.1*  HCT 23.5* 21.0* 22.9* 21.7*  WBC 23.3* 24.1*  --  21.5*  PLT 980* 924*  --  703*    COAGULATION No results for input(s): INR in the last 168 hours.  CARDIAC  No results for input(s): TROPONINI in the last 168 hours. No  results for input(s): PROBNP in the last 168 hours.   CHEMISTRY Recent Labs  Lab 08/02/17 0502 08/03/17 1140 08/04/17 0323 08/05/17 0620 08/06/17 0510 08/07/17 0500  NA 145 146* 149* 155* 153* 154*  K 3.7 3.2* 3.2* 3.7 3.4* 3.5  CL 114* 117* 119* 127* >130* 127*  CO2 22 24 22  20* 18* 23  GLUCOSE 156* 149* 150* 139* 145* 124*  BUN 77* 61* 63* 65* 72* 71*  CREATININE 2.50* 1.87* 1.75* 1.65* 2.08* 2.24*  CALCIUM 7.8* 7.8* 8.3* 8.8* 8.8* 8.3*  MG 1.9  --  2.0 2.5* 2.5* 2.3  PHOS 2.6  --  2.3* 3.3 3.0 3.9   Estimated Creatinine Clearance: 34.9 mL/min (A) (by C-G formula based on SCr of 2.24 mg/dL (H)).   LIVER Recent Labs  Lab 08/01/17 0334 08/05/17 0620  AST 29 35  ALT 23 28  ALKPHOS 76 112  BILITOT 2.7* 2.3*  PROT 6.3* 7.5  ALBUMIN 1.8* 2.0*     INFECTIOUS No results for input(s): LATICACIDVEN, PROCALCITON in the last 168 hours.   ENDOCRINE CBG (last 3)  Recent Labs    08/06/17 2325 08/07/17 0318 08/07/17 0737  GLUCAP 131* 138* 123*    IMAGING Dg Chest Port 1 View  Result Date: 08/06/2017 CLINICAL DATA:  Shortness of breath. EXAM: PORTABLE CHEST 1 VIEW COMPARISON:  Radiograph August 02, 2017. FINDINGS: Stable cardiomediastinal silhouette. No pneumothorax is noted. Endotracheal tube has been removed. Interval placement  of right-sided PICC line with distal tip in expected position of cavoatrial junction. Mild bibasilar subsegmental atelectasis or infiltrates are noted. Probable small left pleural effusion is noted. Bony thorax is unremarkable. IMPRESSION: Interval placement of right-sided PICC line with tip in expected position of cavoatrial junction. Stable bibasilar subsegmental atelectasis or infiltrates are noted, with small left pleural effusion. Electronically Signed   By: Lupita RaiderJames  Green Jr, M.D.   On: 08/06/2017 07:47     CULTURES Blood 12/04: E coli Blood 12/10: negative Surgical wound 12/12:  Pan sensitive E. Coli  ANTIBIOTICS Vancomycin 12/04 >>  12/04 Zosyn 12/04 >> 12/13 Anidulafungin 12/04 >> 12/17 Meropenem 12/14 >> 12/17 Zosyn 12/17 >>   EVENTS 12/04 Admit 12/06 Ileostomy, closure of abdominal wound 12/14 Laparotomy for abscess, evacuation of hematoma  12/16 Off precedex  12/18 Bicarb added for NG acidosis, tx 1 unit PRBC  LINES/TUBES Lt  CVL 12/04 >> 12/16 RUE PICC 12/16 >>   ASSESSMENT / PLAN:  Peritonitis from perforated small bowel and ischemic cecum s/p SB ileocecectomy, ileostomy. Abdominal abscess s/p re-exploration 12/14. E coli bacteremia. P: Post-operative care, Nutrition per [facility] Continue zosyn If fever, would restart antifungal  Discussed with [facility] MD, will scan chest / abd / pelvis without contrast   Agitated delirium with hx of ETOH. P: Limit sedating medications  PT efforts  Promote sleep / wake cycle  At Risk Atelectasis  Tachypnea  P: IS as able  Mobilize  Follow intermittent CXR  A fib with RVR. P: Tele monitoring   Hypertension. P: Continue lopressor, hydralazine  PRN labetalol   Acute renal failure 2nd to ATN. Non Anion Gap Acidosis - gap corrected for albumin 9, likely related to ileostomy Hypernatremia. Hypokalemia. P: D5W with 3 amps bicarb, reduce to 50 ml/hr   Monitor sodium closely with bicarb gtt Trend BMP / urinary output Replace electrolytes as indicated Avoid nephrotoxic agents, ensure adequate renal perfusion  Anemia - suspect related critical illness / phlebotomy. P: Trend CBC   Hyperglycemia. P: SSI   Deconditioning. P: PT efforts   DVT prophylaxis: SQ heparin SUP: Protonix Diet: ice chips  + TNA Goals of care: Full code   Canary BrimBrandi Ruston Fedora, NP-C [facility] Pgr: [phone] or if no answer [phone] 08/07/2017, 8:13 AM

## 2017-08-07 NOTE — Progress Notes (Addendum)
Pt become unresponsive w/ a  25 second period of asystole @ 2129. Previous to this, Pt had 31 sec period of asystole around 1905. Both times Pt was using incentive spirometer. E-link MD notified. Orders given for STAT EKG & CMP lab draw. RN will continue to monitor.    2330- Pt c/o 10/10 chest pain. Notified MD. Orders given to give Fentanyl PRN dose and (3) q6 Troponin lab draws.

## 2017-08-07 NOTE — Plan of Care (Signed)
  Progressing Clinical Measurements: Ability to maintain clinical measurements within normal limits will improve 08/07/2017 1546 - Progressing by Charlett LangoKotian, Ashlay Altieri S, RN Nutrition: Adequate nutrition will be maintained 08/07/2017 1546 - Progressing by Charlett LangoKotian, Tani Virgo S, RN Coping: Level of anxiety will decrease 08/07/2017 1546 - Progressing by Charlett LangoKotian, Posey Jasmin S, RN

## 2017-08-07 NOTE — Consult Note (Signed)
WOC Nurse ostomy follow up: Called to bedside today because ostomy pouch is leaking/lifting at medial edge. Stoma type/location: RLQ ileostomy. Created emergently by Dr. Fredonia HighlandE. Wilson on 07/23/17. Complex postoperative course. Remains in ICU. Open wound seen by my partner yesterday (see note).  Bedside RN has just redressed so I do not see today. Stomal assessment/size: Oval, 1 and 1/8 inches long and 1 and 1/2 inches wide. Mucocutaneous separation from 6-12 o'clock as noted yesterday.  Peristomal assessment: MC separation filled today with ostomy powder and covered with skin barrier ring. Pouching system applied on top of ring.  A one piece soft convex is applied as flat pouching system undermines. One of my partners noted on 07/26/17 that an MD requested a transparent pouching system (2-piece); I see no note of this request in the record.  I will elect to stop leakage using a 1-piece soft convex transparent pouch and favor a seal over any concern over pouching system today. Treatment options for stomal/peristomal skin: As noted above.  Healed areas of pMARSI (peristomal Medical Adhesive Related Skin Injury) Output: Green/brown effluent with mucous  Ostomy pouching: 1pc.soft convex (transparent).  Education provided: None.  Patient is agitated and air hunger continues although bedside RN reports that it is improving with bicarb administration. [place] [facility] [place] Discharge program: No  WOC nursing team will follow, but will remain available to this patient, the nursing, surgical and medical teams.  Thanks, Ladona MowLaurie Alfie Alderfer, MSN, RN, GNP, Hans EdenCWOCN, CWON-AP, FAAN  Pager# [phone]

## 2017-08-07 NOTE — Evaluation (Signed)
Occupational Therapy Evaluation Patient Details Name: Caleb Singh MRN: [id number] DOB: October 28, 1955 Today's Date: 08/07/2017    History of Present Illness 61 yo male admitted with intestinal perforation. S/P resection/ileostomy 12/5+12/6. S/P abscess draining 12/12. VDRF. A fib with RVR. Hx of ETOH abuse, PE.    Clinical Impression   Pt was admitted for the above.  He was seen at bed level today to tolerance.  Pt with increased HR/RR. He is very motivated. Pt needs up to total A for LB adls; cardiopulmonary status is limiting him.  [name].  Initial goals are min +2 SPT and min A for UB adls as well as AROM exercises to increase strength and endurance.    Follow Up Recommendations  SNF    Equipment Recommendations  3 in 1 bedside commode    Recommendations for Other Services       Precautions / Restrictions Precautions Precautions: Fall Precaution Comments: monitor vitals Restrictions Weight Bearing Restrictions: No      Mobility Bed Mobility               General bed mobility comments: not performed  Transfers                 General transfer comment: not performed    Balance                                           ADL either performed or assessed with clinical judgement   ADL Overall ADL's : Needs assistance/impaired Eating/Feeding: NPO   Grooming: Moderate assistance;Bed level   Upper Body Bathing: Maximal assistance;Bed level   Lower Body Bathing: Total assistance;Bed level   Upper Body Dressing : Maximal assistance;Bed level   Lower Body Dressing: Total assistance;Bed level                 General ADL Comments: pt seen at bed level today.  Dyspnea RR 25-41 and HR in low 100s.  Encouraged rest breaks.  Pt wanted to get OOB, but did not perform today.     Vision         Perception     Praxis      Pertinent Vitals/Pain Pain Assessment: No/denies pain     Hand  Dominance     Extremity/Trunk Assessment Upper Extremity Assessment Upper Extremity Assessment: Generalized weakness           Communication Communication Communication: (very dysarthric; NPO)   Cognition Arousal/Alertness: Awake/alert Behavior During Therapy: WFL for tasks assessed/performed Overall Cognitive Status: Within Functional Limits for tasks assessed                                     General Comments  pt is using paper and pencil to write; started to write but decided not to.     Exercises Exercises: (performed bil AAROM  for FF to 90; bicep curls and hand flex)   Shoulder Instructions      Home Living Family/patient expects to be discharged to:: Private residence Living Arrangements: Alone   Type of Home: Apartment                       Home Equipment: None          Prior Functioning/Environment Level of  Independence: Independent                 OT Problem List: Decreased strength;Decreased activity tolerance;Pain;Decreased knowledge of precautions;Cardiopulmonary status limiting activity(will further assess balance/coordination)      OT Treatment/Interventions: Self-care/ADL training;DME and/or AE instruction;Energy conservation;Therapeutic exercise;Patient/family education;Therapeutic activities    OT Goals(Current goals can be found in the care plan section) Acute Rehab OT Goals Patient Stated Goal: to regain PLOF OT Goal Formulation: With patient Time For Goal Achievement: 08/21/17 Potential to Achieve Goals: Good ADL Goals Pt Will Transfer to Toilet: with min assist;with +2 assist;bedside commode Additional ADL Goal #1: pt will perform UB adls with min A and set up, seated Additional ADL Goal #2: pt will perform 10 AROM exercises to bil UEs x 3 motions with rest breaks, to increase strength/endurance for adls  OT Frequency: Min 2X/week   Barriers to D/C:            Co-evaluation              AM-PAC  PT "6 Clicks" Daily Activity     Outcome Measure Help from another person eating meals?: A Little Help from another person taking care of personal grooming?: A Lot Help from another person toileting, which includes using toliet, bedpan, or urinal?: A Lot Help from another person bathing (including washing, rinsing, drying)?: A Lot Help from another person to put on and taking off regular upper body clothing?: A Lot Help from another person to put on and taking off regular lower body clothing?: Total 6 Click Score: 12   End of Session    Activity Tolerance: Patient limited by fatigue Patient left: in bed;with call bell/phone within reach;with nursing/sitter in room  OT Visit Diagnosis: Muscle weakness (generalized) (M62.81)                Time: [id number] OT Time Calculation (min): 13 min Charges:  OT General Charges $OT Visit: 1 Visit OT Evaluation $OT Eval Low Complexity: 1 Low G-Codes:     Caleb Singh, [name] [phone] 08/07/2017  Caleb Singh 08/07/2017, 3:11 PM

## 2017-08-07 NOTE — Progress Notes (Signed)
Physical Therapy Treatment Patient Details Name: Caleb LaundryFrederick Singh MRN: [id number] DOB: 05/04/1956 Today's Date: 08/07/2017    History of Present Illness 61 yo male admitted with intestinal perforation. S/P resection/ileostomy 12/5+12/6. S/P abscess draining 12/12. VDRF. A fib with RVR. Hx of ETOH abuse, PE.     PT Comments    Bed level exercises only on today. Poor activity tolerance. Pt is limited by respiratory status and weakness. Will continue to follow and progress activity as able.    Follow Up Recommendations  SNF     Equipment Recommendations  (continuing to assess)    Recommendations for Other Services OT consult     Precautions / Restrictions Precautions Precautions: Fall Precaution Comments: monitor vitals Restrictions Weight Bearing Restrictions: No    Mobility  Bed Mobility               General bed mobility comments: NT-not appropriate to attempt on today  Transfers                 General transfer comment: not performed  Ambulation/Gait                 Stairs            Wheelchair Mobility    Modified Rankin (Stroke Patients Only)       Balance                                            Cognition Arousal/Alertness: Awake/alert Behavior During Therapy: WFL for tasks assessed/performed Overall Cognitive Status: Difficult to assess                                 General Comments: expressive difficulties-likely due to respiratory issues      Exercises General Exercises - Lower Extremity Ankle Circles/Pumps: AROM;Both;10 reps;Supine Quad Sets: AROM;Both;5 reps;Supine Heel Slides: AAROM;Both;5 reps;Supine    General Comments General comments (skin integrity, edema, etc.): pt is using paper and pencil to write; started to write but decided not to.       Pertinent Vitals/Pain Pain Assessment: No/denies pain    Home Living Family/patient expects to be discharged to:: Private  residence Living Arrangements: Alone   Type of Home: Apartment       Home Equipment: None      Prior Function Level of Independence: Independent          PT Goals (current goals can now be found in the care plan section) Acute Rehab PT Goals Patient Stated Goal: to regain PLOF Progress towards PT goals: Not progressing toward goals - comment(limited by respiratory status and weakness)    Frequency    Min 3X/week      PT Plan Discharge plan needs to be updated    Co-evaluation              AM-PAC PT "6 Clicks" Daily Activity  Outcome Measure  Difficulty turning over in bed (including adjusting bedclothes, sheets and blankets)?: Unable Difficulty moving from lying on back to sitting on the side of the bed? : Unable Difficulty sitting down on and standing up from a chair with arms (e.g., wheelchair, bedside commode, etc,.)?: Unable Help needed moving to and from a bed to chair (including a wheelchair)?: Total Help needed walking in hospital room?: Total Help needed climbing 3-5 steps with  a railing? : Total 6 Click Score: 6    End of Session   Activity Tolerance: Patient limited by fatigue Patient left: in bed;with bed alarm set;with nursing/sitter in room   PT Visit Diagnosis: Muscle weakness (generalized) (M62.81);Other abnormalities of gait and mobility (R26.89);Other symptoms and signs involving the nervous system (R29.898)     Time: 1420-1436 PT Time Calculation (min) (ACUTE ONLY): 16 min  Charges:  $Therapeutic Exercise: 8-22 mins                    G Codes:          Rebeca AlertJannie Kaori Jumper, MPT Pager: [phone]

## 2017-08-07 NOTE — Progress Notes (Signed)
eLink Physician-Brief Progress Note Patient Name: Caleb Singh DOB: July 09, 1956 MRN: [id number]   Date of Service  08/07/2017  HPI/Events of Note  Rn noted 10 s period of sinus pause with unresponsiveness, resolved. Pt now back to previous status.   eICU Interventions  Check 12 lead, CMP.         Shane Crutchradeep Mareesa Gathright 08/07/2017, 9:41 PM

## 2017-08-07 NOTE — Progress Notes (Signed)
PHARMACY - ADULT TOTAL PARENTERAL NUTRITION CONSULT NOTE   Pharmacy Consult for TPN Indication: prolonged ileus  Patient Measurements: Body mass index is 24.17 kg/m. Filed Weights   08/05/17 0600 08/06/17 0500 08/07/17 0221  Weight: 165 lb 5.5 oz (75 kg) 164 lb 0.4 oz (74.4 kg) 168 lb 6.9 oz (76.4 kg)    HPI: 22 YOM admitted on 12/4 with c/o severe and worsening abdominal distention, pain, nonbloody, nonbilious, non-coffee-ground emesis and lack of bowel movements x3 days.  No significant PMH.  He was found to have perforated small bowel s/p repair in OR, and remains intubated/sedated on pressors postop.  Pharmacy is consulted to dose TPN.  Significant events:  12/4 SB resection, open wound VAC.  NG tube placed 12/5 Started CRRT 12/6 Repeat OR on for washout, ileocecectomy, creation of end ileostomy,closure of abdominal wall 12/9 CRRT off 12/9 am 12/12 Exploratory laparotomy, drainage of abdominal abscess and evacuation of pelvic hematoma, RUQ drain, and left lateral drain placement. Findings per surgery note: Subdiaphragmatic and subhepatic abscesses.Large organizing pelvic hematoma. Apparent necrotic tissue left lobe of the liver. 12/14 Extubated 12/17 Some drainage through his ileostomy, but no bowel sounds.  No diet, may have ice chips, sips of clears. 12/18 Transfuse PRBC, start bicarb, cont sips of clears only  Insulin requirements past 24 hours: 10 units SSI/24h  Current Nutrition: NPO except for ice chips, sips of clears.  TPN at goal rate Boost supplement ordered TID  IVF: NaBicarb 150 mEq/L in D5w at 75 ml/hr   Central access: CVC triple lumen placed 12/4 TPN start date: 12/7  ASSESSMENT                                                                                                          Today:   Glucose:  goal < 150 (no hx of DM), CBGs controlled on clinimix 5/15 and moderate SSI.  Monitor with added D5W infusion.  Range 115-140.  Electrolytes: Na elevated at  154, but remained stable despite addition of NaBicarb IVF.  Cl elevated but improved, K low end of WNL.  CorrCa, Phos and Mag WNL.    Electrolytes removed from TPN on 12/17.  Renal:  SCr increased further to 2.24 (off CRRT 12/9a).   I/O = +601 mL/24h; drain output 10 mL, stool output 500 mL; UOP inc to 2690m   Weights trending down.  Admission wt 79.4 kg  LFTs:  AST, ALT, Alk Phos wnl, Tbili elevated but improved, not jaundiced  TGs:  Elevated, propofol stopped 12/7. TGs 233 (12/5), 400 (12/6), 281 (12/7), 322 (12/10), 231 (12/14), 216 (12/17)  Prealbumin:  9.3 (12/5), 9 (12/10), 18.8 (12/17)  NUTRITIONAL GOALS  RD recs (12/14): 105-127g Protein, 7893-8101 Kcal  Based on published guidelines, while the patient meets ICU status the initiation of lipids is being delayed for 7 days or until transition out of ICU. Planned start date for lipids is 12/14.  Goal will be to meet 100% of the patient's protein needs and approximately 80% of their caloric needs.  Clinimix 5/15 @ 100 mL/hr and lipids 20% at 52m/hr over 12h to provide  120 grams protein (100% of goal) and 2064 kcal (100% of goal).  PLAN                                                                                                                         Now: KCl 10 mEq IV x4 runs  At 1800 today:  Continue Clinimix 5/15 NO electrolyte formula at 100 ml/hr   Started lipids 12/14 (held for first week of ICU status) at 122mhr over 12h (dose reduction due to mildly inc trigs)  TPN to contain standard multivitamins and trace element daily.  IVF per MD - NaBicarb/D5 at 75 ml/hr  Continue CBGs q4h checks and SSI moderate scale.   TPN lab panels on Mondays & Thursdays.   Hopeful that diet can be advanced to try more PO intake when mental status improves (currently too sedated to safely eat) and begin weaning TPN.  Continue NPO and full  dose TPN today to support wound healing.    ChGretta ArabharmD, BCPS Pager [phone]/19/2018 7:31 AM

## 2017-08-08 ENCOUNTER — Inpatient Hospital Stay (HOSPITAL_COMMUNITY): Payer: Non-veteran care

## 2017-08-08 ENCOUNTER — Inpatient Hospital Stay (HOSPITAL_COMMUNITY): Payer: Non-veteran care | Admitting: Certified Registered Nurse Anesthetist

## 2017-08-08 DIAGNOSIS — A419 Sepsis, unspecified organism: Secondary | ICD-10-CM

## 2017-08-08 DIAGNOSIS — R6521 Severe sepsis with septic shock: Secondary | ICD-10-CM

## 2017-08-08 DIAGNOSIS — J9600 Acute respiratory failure, unspecified whether with hypoxia or hypercapnia: Secondary | ICD-10-CM

## 2017-08-08 LAB — BLOOD GAS, ARTERIAL
ACID-BASE DEFICIT: 2.5 mmol/L — AB (ref 0.0–2.0)
Bicarbonate: 21.2 mmol/L (ref 20.0–28.0)
DRAWN BY: 422461
FIO2: 100
O2 SAT: 99.8 %
PCO2 ART: 32.9 mmHg (ref 32.0–48.0)
PEEP/CPAP: 5 cmH2O
PO2 ART: 304 mmHg — AB (ref 83.0–108.0)
Patient temperature: 98.2
VT: 580 mL
pH, Arterial: 7.424 (ref 7.350–7.450)

## 2017-08-08 LAB — CBC
HEMATOCRIT: 21.7 % — AB (ref 39.0–52.0)
Hemoglobin: 7.1 g/dL — ABNORMAL LOW (ref 13.0–17.0)
MCH: 29.5 pg (ref 26.0–34.0)
MCHC: 32.7 g/dL (ref 30.0–36.0)
MCV: 90 fL (ref 78.0–100.0)
PLATELETS: 625 10*3/uL — AB (ref 150–400)
RBC: 2.41 MIL/uL — AB (ref 4.22–5.81)
RDW: 16.3 % — ABNORMAL HIGH (ref 11.5–15.5)
WBC: 18.5 10*3/uL — AB (ref 4.0–10.5)

## 2017-08-08 LAB — COMPREHENSIVE METABOLIC PANEL
ALBUMIN: 2.1 g/dL — AB (ref 3.5–5.0)
ALT: 33 U/L (ref 17–63)
ANION GAP: 7 (ref 5–15)
AST: 42 U/L — ABNORMAL HIGH (ref 15–41)
Alkaline Phosphatase: 111 U/L (ref 38–126)
BUN: 65 mg/dL — ABNORMAL HIGH (ref 6–20)
CHLORIDE: 125 mmol/L — AB (ref 101–111)
CO2: 21 mmol/L — AB (ref 22–32)
Calcium: 8.5 mg/dL — ABNORMAL LOW (ref 8.9–10.3)
Creatinine, Ser: 2.27 mg/dL — ABNORMAL HIGH (ref 0.61–1.24)
GFR calc non Af Amer: 29 mL/min — ABNORMAL LOW (ref >60)
GFR, EST AFRICAN AMERICAN: 34 mL/min — AB (ref >60)
GLUCOSE: 191 mg/dL — AB (ref 65–99)
POTASSIUM: 3.6 mmol/L (ref 3.5–5.1)
SODIUM: 153 mmol/L — AB (ref 135–145)
Total Bilirubin: 2 mg/dL — ABNORMAL HIGH (ref 0.3–1.2)
Total Protein: 7.7 g/dL (ref 6.5–8.1)

## 2017-08-08 LAB — GLUCOSE, CAPILLARY
GLUCOSE-CAPILLARY: 104 mg/dL — AB (ref 65–99)
GLUCOSE-CAPILLARY: 122 mg/dL — AB (ref 65–99)
GLUCOSE-CAPILLARY: 127 mg/dL — AB (ref 65–99)
GLUCOSE-CAPILLARY: 97 mg/dL (ref 65–99)
Glucose-Capillary: 125 mg/dL — ABNORMAL HIGH (ref 65–99)
Glucose-Capillary: 141 mg/dL — ABNORMAL HIGH (ref 65–99)
Glucose-Capillary: 95 mg/dL (ref 65–99)

## 2017-08-08 LAB — PROTIME-INR
INR: 1.45
Prothrombin Time: 17.5 seconds — ABNORMAL HIGH (ref 11.4–15.2)

## 2017-08-08 LAB — BLOOD GAS, VENOUS
Acid-base deficit: 5.6 mmol/L — ABNORMAL HIGH (ref 0.0–2.0)
Bicarbonate: 22.6 mmol/L (ref 20.0–28.0)
FIO2: 100
O2 SAT: 77.9 %
PATIENT TEMPERATURE: 37
PO2 VEN: 57.9 mmHg — AB (ref 32.0–45.0)
pCO2, Ven: 66.6 mmHg — ABNORMAL HIGH (ref 44.0–60.0)
pH, Ven: 7.157 — CL (ref 7.250–7.430)

## 2017-08-08 LAB — MAGNESIUM: Magnesium: 2.6 mg/dL — ABNORMAL HIGH (ref 1.7–2.4)

## 2017-08-08 LAB — PHOSPHORUS: PHOSPHORUS: 5.4 mg/dL — AB (ref 2.5–4.6)

## 2017-08-08 LAB — TROPONIN I
TROPONIN I: 0.13 ng/mL — AB (ref <0.03)
Troponin I: 0.12 ng/mL (ref <0.03)
Troponin I: 0.17 ng/mL (ref <0.03)

## 2017-08-08 MED ORDER — SODIUM CHLORIDE 0.9 % IV SOLN
25.0000 ug/h | INTRAVENOUS | Status: DC
  Administered 2017-08-08: 25 ug/h via INTRAVENOUS
  Administered 2017-08-08: 175 ug/h via INTRAVENOUS
  Administered 2017-08-09 – 2017-08-10 (×3): 275 ug/h via INTRAVENOUS
  Administered 2017-08-10: 300 ug/h via INTRAVENOUS
  Administered 2017-08-10: 275 ug/h via INTRAVENOUS
  Administered 2017-08-11: 200 ug/h via INTRAVENOUS
  Administered 2017-08-11: 300 ug/h via INTRAVENOUS
  Filled 2017-08-08 (×10): qty 50

## 2017-08-08 MED ORDER — NOREPINEPHRINE BITARTRATE 1 MG/ML IV SOLN
0.0000 ug/min | INTRAVENOUS | Status: DC
  Administered 2017-08-08: 2 ug/min via INTRAVENOUS
  Filled 2017-08-08: qty 4

## 2017-08-08 MED ORDER — FENTANYL CITRATE (PF) 100 MCG/2ML IJ SOLN
50.0000 ug | Freq: Once | INTRAMUSCULAR | Status: AC
  Administered 2017-08-08: 50 ug via INTRAVENOUS

## 2017-08-08 MED ORDER — SODIUM CHLORIDE 0.9 % IV SOLN
0.0300 [IU]/min | INTRAVENOUS | Status: DC
  Administered 2017-08-08: 0.03 [IU]/min via INTRAVENOUS
  Filled 2017-08-08: qty 2

## 2017-08-08 MED ORDER — FENTANYL CITRATE (PF) 100 MCG/2ML IJ SOLN
50.0000 ug | INTRAMUSCULAR | Status: DC | PRN
  Administered 2017-08-08 – 2017-08-09 (×3): 50 ug via INTRAVENOUS
  Filled 2017-08-08 (×2): qty 2

## 2017-08-08 MED ORDER — LORAZEPAM 2 MG/ML IJ SOLN
2.0000 mg | INTRAMUSCULAR | Status: DC | PRN
  Administered 2017-08-08: 3 mg via INTRAVENOUS
  Filled 2017-08-08: qty 2

## 2017-08-08 MED ORDER — MIDAZOLAM HCL 2 MG/2ML IJ SOLN
2.0000 mg | Freq: Once | INTRAMUSCULAR | Status: AC
  Administered 2017-08-08: 2 mg via INTRAVENOUS

## 2017-08-08 MED ORDER — MIDAZOLAM HCL 2 MG/2ML IJ SOLN: 2.0000 mg | INTRAMUSCULAR | Status: DC | PRN

## 2017-08-08 MED ORDER — HEPARIN SODIUM (PORCINE) 5000 UNIT/ML IJ SOLN
5000.0000 [IU] | Freq: Three times a day (TID) | INTRAMUSCULAR | Status: DC
  Administered 2017-08-08 – 2017-08-13 (×15): 5000 [IU] via SUBCUTANEOUS
  Filled 2017-08-08 (×15): qty 1

## 2017-08-08 MED ORDER — FENTANYL CITRATE (PF) 100 MCG/2ML IJ SOLN
INTRAMUSCULAR | Status: DC | PRN
  Administered 2017-08-08: 50 ug via INTRAVENOUS

## 2017-08-08 MED ORDER — LACTATED RINGERS IV BOLUS (SEPSIS)
1000.0000 mL | Freq: Once | INTRAVENOUS | Status: AC
  Administered 2017-08-08: 1000 mL via INTRAVENOUS

## 2017-08-08 MED ORDER — SODIUM CHLORIDE 0.9 % IV SOLN
200.0000 mg | Freq: Once | INTRAVENOUS | Status: AC
  Administered 2017-08-08: 200 mg via INTRAVENOUS
  Filled 2017-08-08: qty 200

## 2017-08-08 MED ORDER — FENTANYL BOLUS VIA INFUSION
50.0000 ug | INTRAVENOUS | Status: DC | PRN
  Administered 2017-08-08 – 2017-08-11 (×12): 50 ug via INTRAVENOUS
  Filled 2017-08-08: qty 50

## 2017-08-08 MED ORDER — FENTANYL CITRATE (PF) 100 MCG/2ML IJ SOLN
INTRAMUSCULAR | Status: AC
  Filled 2017-08-08: qty 4

## 2017-08-08 MED ORDER — SODIUM CHLORIDE 0.9 % IV SOLN
100.0000 mg | INTRAVENOUS | Status: DC
  Administered 2017-08-08 – 2017-08-11 (×4): 100 mg via INTRAVENOUS
  Filled 2017-08-08 (×4): qty 100

## 2017-08-08 MED ORDER — TRACE MINERALS CR-CU-MN-SE-ZN 10-1000-500-60 MCG/ML IV SOLN
INTRAVENOUS | Status: AC
  Administered 2017-08-08: 18:00:00 via INTRAVENOUS
  Filled 2017-08-08: qty 2000

## 2017-08-08 MED ORDER — MIDAZOLAM HCL 2 MG/2ML IJ SOLN
INTRAMUSCULAR | Status: DC | PRN
  Administered 2017-08-08: 1 mg via INTRAVENOUS

## 2017-08-08 MED ORDER — VANCOMYCIN HCL 10 G IV SOLR
1500.0000 mg | Freq: Once | INTRAVENOUS | Status: AC
  Administered 2017-08-08: 1500 mg via INTRAVENOUS
  Filled 2017-08-08: qty 1500

## 2017-08-08 MED ORDER — MIDAZOLAM HCL 2 MG/2ML IJ SOLN
2.0000 mg | INTRAMUSCULAR | Status: DC | PRN
  Administered 2017-08-08 – 2017-08-09 (×3): 2 mg via INTRAVENOUS
  Filled 2017-08-08 (×4): qty 2

## 2017-08-08 MED ORDER — VANCOMYCIN HCL 10 G IV SOLR: 1250.0000 mg | Freq: Once | INTRAVENOUS | Status: DC

## 2017-08-08 MED ORDER — POTASSIUM CHLORIDE 10 MEQ/50ML IV SOLN
10.0000 meq | INTRAVENOUS | Status: AC
  Administered 2017-08-08 (×3): 10 meq via INTRAVENOUS
  Filled 2017-08-08 (×3): qty 50

## 2017-08-08 MED ORDER — LACTATED RINGERS IV BOLUS (SEPSIS): 1000.0000 mL | Freq: Once | INTRAVENOUS | Status: AC

## 2017-08-08 MED ORDER — VANCOMYCIN HCL 10 G IV SOLR
1500.0000 mg | INTRAVENOUS | Status: DC
  Administered 2017-08-10 – 2017-08-12 (×2): 1500 mg via INTRAVENOUS
  Filled 2017-08-08 (×2): qty 1500

## 2017-08-08 MED ORDER — FAT EMULSION 20 % IV EMUL
180.0000 mL | INTRAVENOUS | Status: AC
  Administered 2017-08-08: 180 mL via INTRAVENOUS
  Filled 2017-08-08: qty 250

## 2017-08-08 MED ORDER — FENTANYL CITRATE (PF) 100 MCG/2ML IJ SOLN
100.0000 ug | Freq: Once | INTRAMUSCULAR | Status: AC
  Administered 2017-08-08: 100 ug via INTRAVENOUS

## 2017-08-08 MED ORDER — MIDAZOLAM HCL 2 MG/2ML IJ SOLN
INTRAMUSCULAR | Status: AC
  Filled 2017-08-08: qty 4

## 2017-08-08 MED FILL — Medication: Qty: 1 | Status: AC

## 2017-08-08 NOTE — Progress Notes (Signed)
PULMONARY / CRITICAL CARE MEDICINE   Name: Caleb Singh MRN: [id number] DOB: 1955/11/12    ADMISSION DATE:  07/23/2017 CONSULTATION DATE: 07/23/2017   CHIEF COMPLAINT: Abdominal pain  BRIEF SUMMARY: 61 y/o male former smoker with severe abdominal pain, and found to have free air in abdomen from perforated small bowel and ischemic cecum.  S/P resection of terminal ileum for ischemia.  Required re-look exploratory laparotomy with drainage of abdominal abscesses and evacuation of pelvic hematoma.    PMHx of ETOH, PE.  SUBJECTIVE:   PEA arrest overnight, intubated.      VITAL SIGNS: BP (!) 129/51   Pulse 88   Temp 100.2 F (37.9 C)   Resp (!) 26   Ht 5\' 10"  (1.778 m)   Wt 173 lb 8 oz (78.7 kg)   SpO2 100%   BMI 24.89 kg/m   INTAKE / OUTPUT: I/O last 3 completed shifts: In: 6871.4 [I.V.:6671.4; IV Piggyback:200] Out: 3370 [Urine:2500; Drains:120; Stool:750]  PHYSICAL EXAMINATION:  General: critically ill appearing male on mechanical ventilation  HEENT: MM pink/moist, no jvd, ETT  Neuro: awakens to voice, follows commands CV: s1s2 rrr, no m/r/g PULM: even/non-labored, lungs bilaterally clear  GI: mild distention, midline incision dressed, jp drains x3  Extremities: warm/dry, trace to 1+ LE edema  Skin: no rashes or lesions  PULMONARY Recent Labs  Lab 08/06/17 0957 08/07/17 0024 08/08/17 0133 08/08/17 0238  PHART 7.448 7.403  --  7.424  PCO2ART 25.2* 32.3  --  32.9  PO2ART 86.6 112*  --  304*  HCO3 17.1* 19.7* 22.6 21.2  O2SAT 96.6 98.0 77.9 99.8    CBC Recent Labs  Lab 08/06/17 0510 08/06/17 1458 08/07/17 0500 08/08/17 0145  HGB 6.8* 7.6* 7.1* 7.1*  HCT 21.0* 22.9* 21.7* 21.7*  WBC 24.1*  --  21.5* 18.5*  PLT 924*  --  703* 625*    COAGULATION No results for input(s): INR in the last 168 hours.  CARDIAC   Recent Labs  Lab 08/07/17 2331 08/08/17 0145  TROPONINI 0.12* 0.13*   No results for input(s): PROBNP in the last 168  hours.   CHEMISTRY Recent Labs  Lab 08/04/17 0323 08/05/17 0620 08/06/17 0510 08/07/17 0500 08/07/17 2211 08/08/17 0145  NA 149* 155* 153* 154* 155* 153*  K 3.2* 3.7 3.4* 3.5 3.6 3.6  CL 119* 127* >130* 127* 128* 125*  CO2 22 20* 18* 23 23 21*  GLUCOSE 150* 139* 145* 124* 149* 191*  BUN 63* 65* 72* 71* 68* 65*  CREATININE 1.75* 1.65* 2.08* 2.24* 2.05* 2.27*  CALCIUM 8.3* 8.8* 8.8* 8.3* 8.4* 8.5*  MG 2.0 2.5* 2.5* 2.3  --  2.6*  PHOS 2.3* 3.3 3.0 3.9  --  5.4*   Estimated Creatinine Clearance: 34.4 mL/min (A) (by C-G formula based on SCr of 2.27 mg/dL (H)).   LIVER Recent Labs  Lab 08/05/17 0620 08/07/17 2211 08/08/17 0145  AST 35 36 42*  ALT 28 31 33  ALKPHOS 112 105 111  BILITOT 2.3* 2.4* 2.0*  PROT 7.5 7.3 7.7  ALBUMIN 2.0* 1.9* 2.1*     INFECTIOUS No results for input(s): LATICACIDVEN, PROCALCITON in the last 168 hours.   ENDOCRINE CBG (last 3)  Recent Labs    08/07/17 1926 08/07/17 2322 08/08/17 0746  GLUCAP 125* 141* 97    IMAGING Ct Abdomen Pelvis Wo Contrast  Result Date: 08/07/2017 CLINICAL DATA:  Abdominal distention. History perforated and ischemic cecum. Exploratory laparotomy for abdominal abscess and pelvic hematoma. EXAM: CT CHEST,  ABDOMEN AND PELVIS WITHOUT CONTRAST TECHNIQUE: Multidetector CT imaging of the chest, abdomen and pelvis was performed following the standard protocol without IV contrast. COMPARISON:  Abdomen and pelvis CT from 8 days ago FINDINGS: CT CHEST FINDINGS Cardiovascular: Normal heart size. No pericardial effusion. PICC on the right with tip at the SVC. Mediastinum/Nodes: Negative for adenopathy. Lungs/Pleura: Multi segment lower lobe atelectasis and trace pleural effusions. Musculoskeletal: No acute or aggressive finding. CT ABDOMEN PELVIS FINDINGS Hepatobiliary: Collection along the left lobe described below. No primary hepatic finding. High-density gallbladder in this patient is NPO per chart. Pancreas: Negative Spleen:  Subcapsular collection extending left subdiaphragmatic, 11 x 6 cm compared to 8 x 5 cm previously. Adrenals/Urinary Tract: Right renal cystic density. No hydronephrosis. High-density lesion lower left kidney compatible with proteinaceous cyst. Thick appearance of the bladder dome which may be from underdistention. No surrounding inflammation. Recent catheterization with small volume pneumaturia. Stomach/Bowel: Recent perforated cecum with small bowel and cecal resection. 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 No obstruction or ileus changes. Previously administered oral contrast seen in the excluded colon. Vascular/Lymphatic: Atherosclerotic calcification. No acute vascular finding. Reproductive: Negative Other: Hemoperitoneum seen throughout the gutters, interloop spaces, pelvis, and bilateral of shallow inguinal hernias. The clot has become more hazy and ill-defined consistent with liquefaction. Collections in the gutters have increased in volume which may be from liquefaction or superimposed progressive infection. A drain has been placed along the superficial liver. Although in close continuity a left ventral perihepatic collection is still large at 12 x 3 cm. fluid around the right liver is significantly improved. Small volume fluid in the hepatic renal fossa. Trace pneumoperitoneum not unexpected after recent surgeries. Musculoskeletal: No acute finding. Severe lumbar disc and facet degeneration Open midline wound. IMPRESSION: 1. History of hemoperitoneum and peritoneal abscess. Increasing pelvic and gutter fluid may be related to hematoma liquefaction or progressive infection. Areas of high-density have decreased from prior, no indication of interval hemorrhage. 2. Progressive perisplenic collection/abscess, 11 x 6 cm. 3. Improved perihepatic collection after drain placement, although there is still sizable collection along the ventral left liver at 12 x 3 cm. The tip of the catheter is  central to this persistent collection. 4. Postoperative bowel without ileus or obstruction. 5. Lower lobe atelectasis and trace effusions. No consolidating pneumonia. 6. Open laparotomy wound.  No undermining or collection. Electronically Signed   By: Marnee SpringJonathon  Watts M.D.   On: 08/07/2017 15:57   Ct Chest Wo Contrast  Result Date: 08/07/2017 CLINICAL DATA:  Abdominal distention. History perforated and ischemic cecum. Exploratory laparotomy for abdominal abscess and pelvic hematoma. EXAM: CT CHEST, ABDOMEN AND PELVIS WITHOUT CONTRAST TECHNIQUE: Multidetector CT imaging of the chest, abdomen and pelvis was performed following the standard protocol without IV contrast. COMPARISON:  Abdomen and pelvis CT from 8 days ago FINDINGS: CT CHEST FINDINGS Cardiovascular: Normal heart size. No pericardial effusion. PICC on the right with tip at the SVC. Mediastinum/Nodes: Negative for adenopathy. Lungs/Pleura: Multi segment lower lobe atelectasis and trace pleural effusions. Musculoskeletal: No acute or aggressive finding. CT ABDOMEN PELVIS FINDINGS Hepatobiliary: Collection along the left lobe described below. No primary hepatic finding. High-density gallbladder in this patient is NPO per chart. Pancreas: Negative Spleen: Subcapsular collection extending left subdiaphragmatic, 11 x 6 cm compared to 8 x 5 cm previously. Adrenals/Urinary Tract: Right renal cystic density. No hydronephrosis. High-density lesion lower left kidney compatible with proteinaceous cyst. Thick appearance of the bladder dome which may be from underdistention. No surrounding inflammation. Recent  catheterization with small volume pneumaturia. Stomach/Bowel: Recent perforated cecum with small bowel and cecal resection. 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 No obstruction or ileus changes. Previously administered oral contrast seen in the excluded colon. Vascular/Lymphatic: Atherosclerotic calcification. No acute vascular finding.  Reproductive: Negative Other: Hemoperitoneum seen throughout the gutters, interloop spaces, pelvis, and bilateral of shallow inguinal hernias. The clot has become more hazy and ill-defined consistent with liquefaction. Collections in the gutters have increased in volume which may be from liquefaction or superimposed progressive infection. A drain has been placed along the superficial liver. Although in close continuity a left ventral perihepatic collection is still large at 12 x 3 cm. fluid around the right liver is significantly improved. Small volume fluid in the hepatic renal fossa. Trace pneumoperitoneum not unexpected after recent surgeries. Musculoskeletal: No acute finding. Severe lumbar disc and facet degeneration Open midline wound. IMPRESSION: 1. History of hemoperitoneum and peritoneal abscess. Increasing pelvic and gutter fluid may be related to hematoma liquefaction or progressive infection. Areas of high-density have decreased from prior, no indication of interval hemorrhage. 2. Progressive perisplenic collection/abscess, 11 x 6 cm. 3. Improved perihepatic collection after drain placement, although there is still sizable collection along the ventral left liver at 12 x 3 cm. The tip of the catheter is central to this persistent collection. 4. Postoperative bowel without ileus or obstruction. 5. Lower lobe atelectasis and trace effusions. No consolidating pneumonia. 6. Open laparotomy wound.  No undermining or collection. Electronically Signed   By: Marnee Spring M.D.   On: 08/07/2017 15:57   Dg Chest Port 1 View  Result Date: 08/08/2017 CLINICAL DATA:  Post intubation. EXAM: PORTABLE CHEST 1 VIEW COMPARISON:  CT yesterday FINDINGS: Endotracheal tube 3.8 cm from the carina. Right upper extremity PICC tip in the distal SVC. Low lung volumes. Mild cardiomegaly. Small bilateral pleural effusions and associated atelectasis. No pulmonary edema or pneumothorax. IMPRESSION: 1. Endotracheal tube 3.8 cm  from the carina. Right upper extremity PICC in place. 2. Low lung volumes with bibasilar atelectasis and small pleural effusions. Electronically Signed   By: Rubye Oaks M.D.   On: 08/08/2017 03:28     CULTURES Blood 12/04: E coli Blood 12/10: negative Surgical wound 12/12:  Pan sensitive E. Coli  ANTIBIOTICS Vancomycin 12/04 >> 12/04 Zosyn 12/04 >> 12/13 Anidulafungin 12/04 >> 12/17 Meropenem 12/14 >> 12/17 Zosyn 12/17 >>  Anidulafungin 12/19 >>  Vanco 12/19 >>   EVENTS 12/04 Admit 12/06 Ileostomy, closure of abdominal wound 12/14 Laparotomy for abscess, evacuation of hematoma  12/16 Off precedex  12/18 Bicarb added for NG acidosis, tx 1 unit PRBC 12/19 Decompensated, PEA arrest > intubated  LINES/TUBES Lt [place] CVL 12/04 >> 12/16 RUE PICC 12/16 >>   ASSESSMENT / PLAN:  GI A: Peritonitis from perforated small bowel and ischemic cecum s/p SB ileocecectomy, ileostomy. Abdominal abscess s/p re-exploration 12/14. E coli bacteremia. P: Post-operative care, TPN / nutrition per [facility] Continue zosyn, anidulafungin, vanco  CCS rec's > possible CT guided drains for increasing abscess in abd, not candidate for surgery at this time, consider palliative care  Appreciate IR assistance   NEURO A: Agitated delirium with hx of ETOH Deconditioning of Critical Illness  P: Fentanyl / versed for sedation / pain  PT efforts   RESPIRATORY A: Acute Respiratory Insufficiency - in setting of PEA arrest, abdominal abscess  P: PRVC 8 cc/kg  Wean PEEP / FiO2 for sats > 90% Follow CXR   CARDIAC  A: A fib with  RVR Hypertension P: Tele monitoring  Trend troponin  Hold lopressor, hydralazine  PRN labetalol for SBP > 170  RENAL A: Acute Renal Failure 2nd to ATN. Non Anion Gap Acidosis - gap corrected for albumin 9, likely related to ileostomy Hypernatremia. Hypokalemia. P: D5W with 3 amps bicarb @ 50 ml/hr Trend BMP / urinary output Replace electrolytes as indicated Avoid  nephrotoxic agents, ensure adequate renal perfusion  HEMATOLOGY  A: Anemia - suspect related critical illness / phlebotomy. P: Trend CBC   ENDOCRINE A: Hyperglycemia. P: SSI    DVT prophylaxis: SQ heparin SUP: Protonix Diet: NPO, TPN Goals of care: Full code  Family - Sister updated 12/20.  She reports the patient is not married and does have grown daughters. They reportedly do not have a very close relationship (maybe talking once every three months).  The daughters do live in town.  [place].  He does not have an HCPOA.  She is attempting to find his daughters to notify them of his illness.  We discussed the nature of his critical illness and the plan going forward.  She understands that he is critically ill and that this will be a difficult course if he survives.  She is in agreement to continue aggressive care for now but understands that if his trend is progressive decline despite aggressive measures that we may need to reconsider our approach.    Canary BrimBrandi Alvie Fowles, NP-C [facility] Pgr: [phone] or if no answer [phone] 08/08/2017, 8:13 AM

## 2017-08-08 NOTE — Procedures (Signed)
Pre procedural Dx: Abdominal Abscess Post procedural Dx: Same  Technically successful CT guided placed of a 10 Fr drainage catheter placement into the left upper abdominal quadrant yielding 50 cc of purulent fluid.    All aspirated samples sent to the laboratory for analysis.    EBL: None  Complications: None immediate  Caleb RightJay Ausha Sieh, MD Pager #: [phone]

## 2017-08-08 NOTE — Progress Notes (Signed)
PHARMACY - ADULT TOTAL PARENTERAL NUTRITION CONSULT NOTE   Pharmacy Consult for TPN Indication: prolonged ileus  Patient Measurements: Body mass index is 24.89 kg/m. Filed Weights   08/06/17 0500 08/07/17 0221 08/08/17 0400  Weight: 164 lb 0.4 oz (74.4 kg) 168 lb 6.9 oz (76.4 kg) 173 lb 8 oz (78.7 kg)    HPI: 49 YOM admitted on 12/4 with c/o severe and worsening abdominal distention, pain, nonbloody, nonbilious, non-coffee-ground emesis and lack of bowel movements x3 days.  No significant PMH.  He was found to have perforated small bowel s/p repair in OR, and remains intubated/sedated on pressors postop.  Pharmacy is consulted to dose TPN.  Significant events:  12/4 SB resection, open wound VAC.  NG tube placed 12/5 Started CRRT 12/6 Repeat OR on for washout, ileocecectomy, creation of end ileostomy,closure of abdominal wall 12/9 CRRT off 12/9 am 12/12 Exploratory laparotomy, drainage of abdominal abscess and evacuation of pelvic hematoma, RUQ drain, and left lateral drain placement. Findings per surgery note: Subdiaphragmatic and subhepatic abscesses.Large organizing pelvic hematoma. Apparent necrotic tissue left lobe of the liver. 12/14 Extubated 12/17 Some drainage through his ileostomy, but no bowel sounds.  No diet, may have ice chips, sips of clears. 12/18 Transfuse PRBC, start bicarb, cont sips of clears only 12/20 Pt unresponsive, now re-intubated.  TPN was stopped during code.  Insulin requirements past 24 hours: 4 units SSI/24h  Current Nutrition: NPO.  TPN at goal rate   IVF: NaBicarb 150 mEq/L in D5W at 50 ml/hr , D5W at 69m/hr  Central access: CVC triple lumen placed 12/4 TPN start date: 12/7  ASSESSMENT                                                                                                          Today:   Glucose:  goal < 150 (no hx of DM), CBGs controlled on clinimix 5/15 and moderate SSI.  Monitor with added D5W infusion.  Range  97-141.  Electrolytes: Na elevated at 153, but remained stable despite addition of NaBicarb IVF.  Cl elevated but improved, K low end of WNL.  CorrCa WNL, Phos and Mag elevated today. CaPhos 54 (goal <55).    Electrolytes removed from TPN on 12/17.  Renal:  SCr increased further to 2.27 (off CRRT 12/9a).   I/O = +2398 mL/24h; drain output 120 mL, stool output 550 mL; UOP 15269m  Weight still <admit weight, but increased 2.3kg.  Admission wt 79.4 kg  LFTs:  AST, ALT, Alk Phos wnl, Tbili elevated but improved, not jaundiced  TGs:  Elevated, propofol stopped 12/7. TGs 233 (12/5), 400 (12/6), 281 (12/7), 322 (12/10), 231 (12/14), 216 (12/17)  Prealbumin:  9.3 (12/5), 9 (12/10), 18.8 (12/17)  NUTRITIONAL GOALS  RD recs (12/14): 105-127g Protein, 0086-7619 Kcal  Based on published guidelines, while the patient meets ICU status the initiation of lipids is being delayed for 7 days or until transition out of ICU. Planned start date for lipids is 12/14.  Goal will be to meet 100% of the patient's protein needs and approximately 80% of their caloric needs.  Clinimix 5/15 @ 100 mL/hr and lipids 20% at 31m/hr over 12h to provide  120 grams protein (100% of goal) and 2064 kcal (100% of goal).  PLAN                                                                                                                         Now:  IV KCl x3 runs today At 1800 today:  Resume Clinimix 5/15 NO electrolyte formula at 100 ml/hr   Started lipids 12/14 (held for first week of ICU status) at 156mhr over 12h (dose reduction due to mildly inc trigs)  TPN to contain standard multivitamins and trace element daily.  IVF per MD - NaBicarb/D5 at 75 ml/hr + D5W at 5029mr  Continue CBGs q4h checks and SSI moderate scale.   TPN lab panels on Mondays & Thursdays.   MicNetta CedarsharmD, BCPS Pager:  [phone]/20/2018 8:03 AM

## 2017-08-08 NOTE — Consult Note (Addendum)
WOC Nurse ostomy follow up:  Stoma type/location: RLQ ileostomy. One piece convex pouch that was placed yesterday is in place with no leaks. Applied yesterday by L. McNichol, WOCN, will leave in place today. Output: Dark Green/brown effluent with mucous, 150cc in bag, emptied. Ostomy pouching: 1pc.soft convex (transparent).  Education provided: None. Patient coded last shift and is now sedated on the ventilator. Family present but education is inappropriate at this time with his grave condition.  [place] in [employer] Dis[place] program: Previously  WOC Nurse wound follow up Wound type: surgical full thickness Measurement: 20cm x 5cm x 3cm Wound bed: 70% loose wet tan/yellow slough, 30% pink (edges only, entire bottom of wound covered in slough, see photo from 12/19) Drainage (amount, consistency, odor) Moderate brown colored exudate on packing. Able to dab out clear serous drainage from wound bed, no odor noted. Periwound: intact Dressing procedure/placement/frequency: I was present and assisted with dressing change with Verdia KubaZoe Suggs, RN, WTA. Wound has greatly deteriorated in past 4 days. Dressings being performed TID now. Mostly non viable wound bed. Patient also has partial thickness wound on scrotum. Patient had his foley removed over weekend and condom cath difficult to keep in place, moisture was a definite problem. Patient now with foley again. We will follow this patient and remain available to this patient, nursing, and the medical and surgical teams.    Barnett HatterMelinda Jayanth Szczesniak, RN-C, WTA-C, [facility] Wound Treatment Associate Ostomy Care Associate

## 2017-08-08 NOTE — Anesthesia Procedure Notes (Signed)
Procedure Name: Intubation Date/Time: 08/08/2017 1:40 AM Performed by: Montel Clock, CRNA Pre-anesthesia Checklist: Patient identified, Emergency Drugs available, Suction available and Patient being monitored Patient Re-evaluated:Patient Re-evaluated prior to induction Oxygen Delivery Method: Ambu bag Preoxygenation: Pre-oxygenation with 100% oxygen Ventilation: Mask ventilation without difficulty Laryngoscope Size: Mac and 3 Grade View: Grade II Tube type: Subglottic suction tube Tube size: 7.5 mm Number of attempts: 1 Airway Equipment and Method: Stylet Placement Confirmation: ETT inserted through vocal cords under direct vision,  breath sounds checked- equal and bilateral and CO2 detector Secured at: 23 cm Tube secured with: Tape (RT applied securement device.) Dental Injury: Teeth and Oropharynx as per pre-operative assessment

## 2017-08-08 NOTE — Progress Notes (Signed)
Nutrition Follow-up  DOCUMENTATION CODES:   Not applicable  INTERVENTION:  - Will d/c Boost Breeze given re-intubation.  - Re-start of TPN as medical status allows and TPN to be per Pharmacy.  NUTRITION DIAGNOSIS:   Inadequate oral intake related to inability to eat as evidenced by NPO status. -ongoing  GOAL:   Patient will meet greater than or equal to 90% of their needs -unmet/unable to meet  MONITOR:   Vent status, Weight trends, Labs, Skin, I & O's, Other (Comment)(plan concerning nutrition support)  ASSESSMENT:   61 y.o. male with no significant past medical history complaining of severely worsening abdominal distention, pain, nonbloody, nonbilious, non-coffee-ground emesis and lack of bowel movements over the course of the last 3 days no BM in 48 hours, feels like he needs to deficate.  He denies prior abdominal surgeries, no fever or chills.  Patient is unsure if he is passing flatus.  He is never had any liver issues he does drink regularly but has never had any seizures or hallucinations when he does not drink, he has not had any alcohol in approximately 1 week.  He is never seen a gastroenterologist.   Surgeries and events: 12/4:perforated viscus involving distal small bowel s/p ex lap with small bowel resection and abdominal wound vac placement; no anastomosis, peritonitis with E. Coli bacteremia. 12/6:re-exploration and creation of ileostomy, closure of abdominal wall; CRRT initiated 12/7:TPN initiated 12/9:CRRT stopped 12/12:subdiaphragmatic and subhepatic abscesses, large pelvic hematoma aand necrotic L liver lobe; re-exploration, drainage of abdominal abscess, evacuation of pelvic hematoma and drain placement 12/14:extubated, NGT removed 12/17: D5 IVF added outside of TPN d/t hypernatremia 12/20: re-intubated d/t unresponsive event    12/20 Pt was found to be unresponsive by RN around 1:30 this AM and pt was subsequently re-intubated. Notes state that  repeat ABG showed metabolic acidosis and respiratory alkalosis. PICC remains in place and TPN hanging but not infusing at this time; suspect this is related to events that occurred overnight and need for pressor support to be initiated. Updated estimated needs at this time d/t intubation.   Current weight is consistent with admission weight (weight has fluctuated often throughout hospitalization) so used this weight for re-estimation. Repeated NFPE, which is outlined below. Do not feel comfortable stating malnutrition at this time but will continue to monitor closely and will re-evaluate as warranted in the future.  Patient is currently intubated on ventilator support MV: 15 L/min Temp (24hrs), Avg:99.8 F (37.7 C), Min:98.2 F (36.8 C), Max:100.8 F (38.2 C) Propofol: none BP: 108/46 and MAP: 65  Medications reviewed; 1 mg IV folic acid/day, sliding scale Novolog, 40 mg IV Protonix/day, 10 mEq IV KCl x4 runs yesterday, 100 mg IV thiamine/day. Labs reviewed; CBG: 97 mg/dL this AM, Na: 15 mmol/L, Cl: 125 mmol/L, BUN: 65 mg/dL, creatinine: 1.612.27 mg/dL, Ca: 8.5 mg/dL, Phos: 5.4 mg/dL, Mg: 2.6 mg/dL, GFR: 34 mL/min.  IVF: D5-150 mEq sodium bicarb @ 50 mL/hr (204 kcal) Drips: Vaso @ 0.02 units/min, Fentanyl  150 mcg/hr.     12/18 - Pt remains NPO but starting yesterday was allowed ice chips flavored with clear liquids.  - Pt with mumbled speech and is difficult to understand; no family/visitors present. - RN reports that last night pt ended up having 2 full cartons of Boost Breeze mixed with his ice chips and that he has been complaining of abdominal pain ever since.  - Pt nods to confirm this information.  - Weight continues to trend down.  - Will repeat  NFPE at follow-up d/t this significant change.  - Central line was removed and PICC placed in R brachial on 12/16.  - Pt receiving Clinimix 5/15 @ 100 mL/hr with 20% ILE @ 15 mL/hr x12 mL/hr.  - This regimen provides 120 grams of protein and  2064 kcal.   Labs; Na: 153 mmol/L, K: 3.4 mmol/L, Cl: >130 mmol/L, Mg: 2.5 mg/dL IVF: D5 @ 50 mL/hr (161204 kcal)     12/14 - Pt was extubated at ~8:30 AM today and NGT remains in place.  - Weight -7 lbs/3.2 kg compared to weight yesterday.  - Estimated nutrition needs updated based on extubation and based on admission weight (79.4 kg).  - Per Pharmacy note, plan for Clinimix 5/15 (no lytes) @ 100 mL/hr with 20% ILE @ 15 mL/hr x12 hours.   Medication; 10 mEq IV KCl x5 runs today     NUTRITION - FOCUSED PHYSICAL EXAM:    Most Recent Value  Orbital Region  Unable to assess  Upper Arm Region  Mild depletion  Thoracic and Lumbar Region  Unable to assess  Buccal Region  Unable to assess  Temple Region  Unable to assess  Clavicle Bone Region  Moderate depletion  Clavicle and Acromion Bone Region  Moderate depletion  Scapular Bone Region  Unable to assess  Dorsal Hand  Unable to assess  Patellar Region  Mild depletion  Anterior Thigh Region  Mild depletion  Posterior Calf Region  Moderate depletion  Edema (RD Assessment)  None  Hair  Reviewed  Eyes  Unable to assess  Mouth  Unable to assess  Skin  Reviewed  Nails  Unable to assess       Diet Order:  TPN (CLINIMIX) Adult without lytes  EDUCATION NEEDS:   No education needs have been identified at this time  Skin:  Skin Assessment: Skin Integrity Issues: Skin Integrity Issues:: Incisions, Stage II Stage II: scrotum Incisions: abdominal from surgeries 12/4, 12/6, and 12/12  Last BM:  12/19  Height:   Ht Readings from Last 1 Encounters:  08/08/17 5\' 10"  (1.778 m)    Weight:   Wt Readings from Last 1 Encounters:  08/08/17 173 lb 8 oz (78.7 kg)    Ideal Body Weight:  75.45 kg  BMI:  Body mass index is 24.89 kg/m.  Estimated Nutritional Needs:   Kcal:  2161  Protein:  110-126 grams (1.4-1.6 grams/kg)  Fluid:  >2 L/day     Trenton GammonJessica Cherye Gaertner, MS, RD, LDN, [facility] Inpatient Clinical Dietitian Pager #  [phone] After hours/weekend pager # [phone]

## 2017-08-08 NOTE — Progress Notes (Signed)
Verbal order received for foley catheter insertion from Dr. Jillene BucksLaurence. Foley catheter placed at 0240. See additional documentation in flow sheet.

## 2017-08-08 NOTE — ED Provider Notes (Signed)
This is a 61 year old male admitted on December 4 for a bowel obstruction.  He has had a complicated postoperative course.  I was called to his room to answer a CODE BLUE.  The patient had been having shallow breathing for several days.  He was noted to have brief periods of asystole prior to the CODE BLUE event.  These episodes lasted upwards of 30 seconds.  He became apneic and a CODE BLUE was called.  On my arrival he had been given 1 mg of epinephrine and was undergoing compressions and being bag-valve-mask ventilated by respiratory therapy.  He was noted to be tachycardic with a bounding pulse and I advised that compressions were no longer necessary.   The patient was then intubated by CRNA without difficulty.  Breath sounds were noted to be equal bilaterally.  The patient did have some agonal spontaneous respirations but required continued ventilation first by bag valve mask and then by ventilator.  The patient continued to have a bounding pulse throughout this encounter.  Critical care medicine was consulted and will reassess the patient.  Venous blood gas showed a pH of 7.157 with a PCO2 of 66.  This likely represents a complex mixed acidosis.  He has a respiratory component due to hypercapnia, likely associated with his shallow breathing and acute respiratory arrest.  He also has a known persistent metabolic acidosis with likely a superimposed acute lactic acidosis from transient hypoxia.  Respiratory therapy was advised to increase the patient's ventilatory rate until his hypercapnia improves.     Amayah Staheli, MD 08/08/17 0200

## 2017-08-08 NOTE — Progress Notes (Addendum)
Event start: 0128--Pt unresponsive,Sinus Brady, HR- 0, RR-0,  Chest Compressions started @ 0130 Bag Valve Mask- 0129 Epinephrine 1 given @ 0131 Ended 0145.  Per MD order- Pt given 1 L bolus LR and started on 50 mcg Phenylephrine.Orders also given for 100 mcg Fentanyl; 2 Versed for  Art Line placement. Pt transitioned off of NEO, and switched to Levo and Vaso. Currently on 2 mcg Levo; 0.03 Vasopressin and stable- BP- 128/53 HR-87, NSR, 02 Sat 100%  RN updated Pt's emergency contact (sister) Desiree on events overnight. She can be reached at [phone]. RN will continue to monitor.

## 2017-08-08 NOTE — Consult Note (Signed)
Chief Complaint: Patient was seen in consultation today for CT guided drainage of abdominal/ pelvic fluid collections Chief Complaint  Patient presents with  . Abdominal Pain    Referring Physician(s): Connor,C  Supervising Physician: Simonne ComeWatts, John  [facility]  History of Present Illness: Caleb Singh is a 61 y.o. male with history of small bowel perforation/cecal ischemia/E. coli bacteremia who underwent small bowel resection without anastomosis, wound VAC placement on 07/23/17.  He subsequently underwent reexploration on 07/25/17 with ileocecectomy, end ileostomy and closure of abdominal wall.  On 07/31/17 he returned to the OR for exploratory lap, drainage of abdominal abscesses with evacuation of pelvic hematoma and drain placements.  He is status post cardiac arrest/CPR this morning at 0130 and is now intubated.  CT scan performed yesterday revealed increasing pelvic and gutter fluid, progressive perisplenic collection/abscess, improved perihepatic collection with continued collection along the ventral left liver, trace effusions.  Request now received from surgery for CT-guided drainage of additional abd/pelvic fluid collections.  History reviewed. No pertinent past medical history.  Past Surgical History:  Procedure Laterality Date  . I&D EXTREMITY Right 04/17/2016   Procedure: IRRIGATION AND DEBRIDEMENT RIGHT HAND;  Surgeon: Dominica SeverinWilliam Gramig, MD;  [facility];  Service: Orthopedics;  Laterality: Right;  . I&D EXTREMITY Right 04/19/2016   Procedure: REPEAT I&D RIGHT HAND;  Surgeon: Dominica SeverinWilliam Gramig, MD;  Location: [facility] OR;  Service: Orthopedics;  Laterality: Right;  . LAPAROTOMY N/A 07/23/2017   Procedure: EXPLORATORY LAPAROTOMY, SMALL BOWEL RESECTION;  Surgeon: Gaynelle AduWilson, Eric, MD;  Location: [facility] ORS;  Service: General;  Laterality: N/A;  . LAPAROTOMY N/A 07/25/2017   Procedure: EXPLORATORY LAPAROSCOPY WITH ILEOCECTOMY, END ILEOSTOMY;  Surgeon: Almond LintByerly, Faera, MD;   Location: [facility] ORS;  Service: General;  Laterality: N/A;  PATIENT ABDOMINAL WOUND LEFT OPEN AND PACKED WITH BULKY DRESSING  . LAPAROTOMY N/A 07/31/2017   Procedure: EXPLORATORY LAPAROTOMY drainage of abdominal abcess;  Surgeon: Glenna FellowsHoxworth, Benjamin, MD;  Location: [facility] ORS;  Service: General;  Laterality: N/A;    Allergies: Patient has no known allergies.  Medications: Prior to Admission medications   Medication Sig Start Date End Date Taking? Authorizing Provider  clindamycin (CLEOCIN) 300 MG capsule Take 1 capsule (300 mg total) by mouth 3 (three) times daily. Patient not taking: Reported on 07/23/2017 04/21/16   Dominica SeverinGramig, William, MD  oxyCODONE (OXY IR/ROXICODONE) 5 MG immediate release tablet Take 1-2 tablets (5-10 mg total) by mouth every 3 (three) hours as needed for moderate pain. Patient not taking: Reported on 07/23/2017 04/21/16   Dominica SeverinGramig, William, MD     History reviewed. No pertinent family history.  Social History   Socioeconomic History  . Marital status: Single    Spouse name: None  . Number of children: None  . Years of education: None  . Highest education level: None  Social Needs  . Financial resource strain: None  . Food insecurity - worry: None  . Food insecurity - inability: None  . Transportation needs - medical: None  . Transportation needs - non-medical: None  Occupational History  . None  Tobacco Use  . Smoking status: Former Games developermoker  . Smokeless tobacco: Never Used  Substance and Sexual Activity  . Alcohol use: No  . Drug use: No  . Sexual activity: None  Other Topics Concern  . None  Social History Narrative  . None      Review of Systems intubated  Vital Signs: BP (!) 110/50   Pulse 89   Temp (!) 100.6  F (38.1 C)   Resp (!) 26   Ht 5\' 10"  (1.778 m)   Wt 173 lb 8 oz (78.7 kg)   SpO2 100%   BMI 24.89 kg/m   Physical Exam intubated; chest clear to auscultation bilaterally, heart with slightly tachycardic but regular rhythm.  Abdomen distended,  midline open wound, right lower quadrant ostomy as well as two right abdominal surgically placed drains with brown fluid in bulbs., trace lower extremity edema  Imaging: Ct Abdomen Pelvis Wo Contrast  Result Date: 08/07/2017 CLINICAL DATA:  Abdominal distention. History perforated and ischemic cecum. Exploratory laparotomy for abdominal abscess and pelvic hematoma. EXAM: CT CHEST, ABDOMEN AND PELVIS WITHOUT CONTRAST TECHNIQUE: Multidetector CT imaging of the chest, abdomen and pelvis was performed following the standard protocol without IV contrast. COMPARISON:  Abdomen and pelvis CT from 8 days ago FINDINGS: CT CHEST FINDINGS Cardiovascular: Normal heart size. No pericardial effusion. PICC on the right with tip at the SVC. Mediastinum/Nodes: Negative for adenopathy. Lungs/Pleura: Multi segment lower lobe atelectasis and trace pleural effusions. Musculoskeletal: No acute or aggressive finding. CT ABDOMEN PELVIS FINDINGS Hepatobiliary: Collection along the left lobe described below. No primary hepatic finding. High-density gallbladder in this patient is NPO per chart. Pancreas: Negative Spleen: Subcapsular collection extending left subdiaphragmatic, 11 x 6 cm compared to 8 x 5 cm previously. Adrenals/Urinary Tract: Right renal cystic density. No hydronephrosis. High-density lesion lower left kidney compatible with proteinaceous cyst. Thick appearance of the bladder dome which may be from underdistention. No surrounding inflammation. Recent catheterization with small volume pneumaturia. Stomach/Bowel: Recent perforated cecum with small bowel and cecal resection. 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 No obstruction or ileus changes. Previously administered oral contrast seen in the excluded colon. Vascular/Lymphatic: Atherosclerotic calcification. No acute vascular finding. Reproductive: Negative Other: Hemoperitoneum seen throughout the gutters, interloop spaces, pelvis, and bilateral of  shallow inguinal hernias. The clot has become more hazy and ill-defined consistent with liquefaction. Collections in the gutters have increased in volume which may be from liquefaction or superimposed progressive infection. A drain has been placed along the superficial liver. Although in close continuity a left ventral perihepatic collection is still large at 12 x 3 cm. fluid around the right liver is significantly improved. Small volume fluid in the hepatic renal fossa. Trace pneumoperitoneum not unexpected after recent surgeries. Musculoskeletal: No acute finding. Severe lumbar disc and facet degeneration Open midline wound. IMPRESSION: 1. History of hemoperitoneum and peritoneal abscess. Increasing pelvic and gutter fluid may be related to hematoma liquefaction or progressive infection. Areas of high-density have decreased from prior, no indication of interval hemorrhage. 2. Progressive perisplenic collection/abscess, 11 x 6 cm. 3. Improved perihepatic collection after drain placement, although there is still sizable collection along the ventral left liver at 12 x 3 cm. The tip of the catheter is central to this persistent collection. 4. Postoperative bowel without ileus or obstruction. 5. Lower lobe atelectasis and trace effusions. No consolidating pneumonia. 6. Open laparotomy wound.  No undermining or collection. Electronically Signed   By: Marnee SpringJonathon  Watts M.D.   On: 08/07/2017 15:57   Ct Abdomen Pelvis Wo Contrast  Result Date: 07/31/2017 CLINICAL DATA:  Acute onset of generalized abdominal pain and fever. Recent ileocecectomy and ileostomy. Hypoxia. EXAM: CT ABDOMEN AND PELVIS WITHOUT CONTRAST TECHNIQUE: Multidetector CT imaging of the abdomen and pelvis was performed following the standard protocol without IV contrast. COMPARISON:  CT of the abdomen and pelvis from 07/23/2017 FINDINGS: Lower chest: Dense consolidation of the right lower  lobe raises concern for pneumonia. Small bilateral pleural  effusions are noted. The visualized portions of the mediastinum are grossly unremarkable. Hepatobiliary: The nodular contour of the liver is concerning for hepatic cirrhosis. There is a somewhat unusual heterogeneous appearance to the anterior aspect of the left hepatic lobe, new from December 4th and concerning for evolving abscess. The gallbladder is grossly unremarkable in appearance, though difficult to fully assess given surrounding ascites. The common bile duct remains normal in caliber. Pancreas: The pancreas is within normal limits. Spleen: There is mild heterogeneity in the periphery of the spleen, more prominent than on the prior study. This is of uncertain significance. Surrounding ascites is noted. Adrenals/Urinary Tract: The adrenal glands are unremarkable in appearance. Scattered hypodense and hyperdense renal cysts are noted bilaterally. Nonspecific perinephric stranding is noted. There is no evidence of hydronephrosis. No renal or ureteral stones are identified. Stomach/Bowel: A large amount of extraluminal contrast is noted within the lower abdomen and pelvis, concerning for recurrent bowel perforation. The site of perforation is difficult to fully characterize, though the highest density of contrast is noted at the right lower quadrant, and this may arise from the ascending colonic anastomosis. The small bowel loops are not well assessed. Diffuse ascites tracks about the upper abdomen. Contrast is seen along the paracolic gutters bilaterally. The stomach is decompressed, with an enteric tube ending at the body of the stomach. Mild residual contrast is seen within the colon. Vascular/Lymphatic: The abdominal aorta is unremarkable in appearance. The inferior vena cava is grossly unremarkable. No retroperitoneal lymphadenopathy is seen. No pelvic sidewall lymphadenopathy is identified. Reproductive: The bladder is decompressed, with a Foley catheter in place. The prostate remains normal in size.  Other: There is mild dehiscence of the patient's anterior abdominal surgical site. Musculoskeletal: No acute osseous abnormalities are identified. Facet disease is noted at the lower lumbar spine. The visualized musculature is unremarkable in appearance. IMPRESSION: 1. Large amount of extraluminal contrast noted tracking throughout the lower abdomen and pelvis, concerning for recurrent bowel perforation. The site of perforation is difficult to characterize, though the highest density contrast is seen at the right lower quadrant. This may arise from the ascending colonic anastomosis. 2. Small-bowel loops are not well assessed due to surrounding contrast and ascites. Diffuse ascites noted tracking about the upper abdomen. 3. New heterogeneous appearance to the anterior aspect of the left hepatic lobe, raising concern for evolving abscess. Mild nonspecific heterogeneous appearance along the periphery of the spleen. 4. Dense consolidation of the right lower lung lobe raises concern for pneumonia. Small bilateral pleural effusions noted. 5. Mild dehiscence of the patient's anterior abdominal surgical site. Critical Value/emergent results were called by telephone at the time of interpretation on 07/31/2017 at 12:32 am to Dr. Vaughan Basta, who verbally acknowledged these results. Electronically Signed   By: Roanna Raider M.D.   On: 07/31/2017 00:48   Ct Abdomen Pelvis Wo Contrast  Result Date: 07/23/2017 CLINICAL DATA:  Severe abdominal pain and distention. EXAM: CT ABDOMEN AND PELVIS WITHOUT CONTRAST TECHNIQUE: Multidetector CT imaging of the abdomen and pelvis was performed following the standard protocol without IV contrast. COMPARISON:  None. FINDINGS: Lower chest: Normal heart size without pericardial effusion. Small bilateral pleural effusions with adjacent atelectasis. Hepatobiliary: Branching air like lucencies in the left hepatic lobe, series 2 image 29 and coronal series 5 image 37 concerning for portal venous  gas. No space-occupying mass of the liver. The gallbladder is physiologically distended. Pancreas: Normal pancreas without ductal dilatation or mass on  this unenhanced study. Spleen: Normal size spleen. Adrenals/Urinary Tract: Normal bilateral adrenal glands. 2.8 cm right interpolar posterior complex cyst with thin septation. Hyperdense left lower pole 0.8 cm lesion likely hemorrhagic or proteinaceous cyst. Decompressed urinary bladder. Stomach/Bowel: Fluid-filled distention of the stomach. The contours of the stomach appear intact without apparent ulceration nor perforation. Fluid-filled distention of the second through fourth portions of the duodenum without perforation nor retroperitoneal emphysema. Moderate fluid-filled distention of jejunal loops with pneumatosis. Serpiginous extraluminal air emanating off a segment of small intestine in the right hemiabdomen, series 2, image 51 might represent a potential site for small bowel perforation. The colon is unremarkable apart from scattered colonic diverticulosis. Vascular/Lymphatic: Mild aortoiliac atherosclerosis without aneurysm. No lymphadenopathy. Reproductive: Prostate is normal in size. Other: Moderate to marked amount of free intraperitoneal air admixed with free fluid, subdiaphragmatic, perihepatic and perisplenic splenic in location extending along both paracolic gutters into the pelvis. Small bowel containing right inguinal hernia with fluid containing left inguinal hernia. Musculoskeletal: Thoracolumbar spondylosis. IMPRESSION: 1. Large amount of free intraperitoneal air and fluid consistent with perforated hollow viscus likely involving small intestine with possible source in the right hemiabdomen, series 2, image 51. Critical Value/emergent results were called by telephone at the time of interpretation on 07/23/2017 at 4:59 pm to [facility] , who verbally acknowledged these results. 2. Abnormal bowel gas pattern concerning for small bowel  pneumatosis with small amount of portal venous gas in the left hepatic lobe. This raises concern for ischemic bowel. 3. Bilateral inguinal hernias on the right containing a short segment of small intestine and on the left containing fluid. These are reportedly reducible clinically. 4. Complex bilateral renal cysts. Electronically Signed   By: Tollie Eth M.D.   On: 07/23/2017 17:03   Ct Chest Wo Contrast  Result Date: 08/07/2017 CLINICAL DATA:  Abdominal distention. History perforated and ischemic cecum. Exploratory laparotomy for abdominal abscess and pelvic hematoma. EXAM: CT CHEST, ABDOMEN AND PELVIS WITHOUT CONTRAST TECHNIQUE: Multidetector CT imaging of the chest, abdomen and pelvis was performed following the standard protocol without IV contrast. COMPARISON:  Abdomen and pelvis CT from 8 days ago FINDINGS: CT CHEST FINDINGS Cardiovascular: Normal heart size. No pericardial effusion. PICC on the right with tip at the SVC. Mediastinum/Nodes: Negative for adenopathy. Lungs/Pleura: Multi segment lower lobe atelectasis and trace pleural effusions. Musculoskeletal: No acute or aggressive finding. CT ABDOMEN PELVIS FINDINGS Hepatobiliary: Collection along the left lobe described below. No primary hepatic finding. High-density gallbladder in this patient is NPO per chart. Pancreas: Negative Spleen: Subcapsular collection extending left subdiaphragmatic, 11 x 6 cm compared to 8 x 5 cm previously. Adrenals/Urinary Tract: Right renal cystic density. No hydronephrosis. High-density lesion lower left kidney compatible with proteinaceous cyst. Thick appearance of the bladder dome which may be from underdistention. No surrounding inflammation. Recent catheterization with small volume pneumaturia. Stomach/Bowel: Recent perforated cecum with small bowel and cecal resection. 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 No obstruction or ileus changes. Previously administered oral contrast seen in the  excluded colon. Vascular/Lymphatic: Atherosclerotic calcification. No acute vascular finding. Reproductive: Negative Other: Hemoperitoneum seen throughout the gutters, interloop spaces, pelvis, and bilateral of shallow inguinal hernias. The clot has become more hazy and ill-defined consistent with liquefaction. Collections in the gutters have increased in volume which may be from liquefaction or superimposed progressive infection. A drain has been placed along the superficial liver. Although in close continuity a left ventral perihepatic collection is still large at 12 x 3 cm.  fluid around the right liver is significantly improved. Small volume fluid in the hepatic renal fossa. Trace pneumoperitoneum not unexpected after recent surgeries. Musculoskeletal: No acute finding. Severe lumbar disc and facet degeneration Open midline wound. IMPRESSION: 1. History of hemoperitoneum and peritoneal abscess. Increasing pelvic and gutter fluid may be related to hematoma liquefaction or progressive infection. Areas of high-density have decreased from prior, no indication of interval hemorrhage. 2. Progressive perisplenic collection/abscess, 11 x 6 cm. 3. Improved perihepatic collection after drain placement, although there is still sizable collection along the ventral left liver at 12 x 3 cm. The tip of the catheter is central to this persistent collection. 4. Postoperative bowel without ileus or obstruction. 5. Lower lobe atelectasis and trace effusions. No consolidating pneumonia. 6. Open laparotomy wound.  No undermining or collection. Electronically Signed   By: Marnee Spring M.D.   On: 08/07/2017 15:57   Dg Chest Port 1 View  Result Date: 08/08/2017 CLINICAL DATA:  Post intubation. EXAM: PORTABLE CHEST 1 VIEW COMPARISON:  CT yesterday FINDINGS: Endotracheal tube 3.8 cm from the carina. Right upper extremity PICC tip in the distal SVC. Low lung volumes. Mild cardiomegaly. Small bilateral pleural effusions and  associated atelectasis. No pulmonary edema or pneumothorax. IMPRESSION: 1. Endotracheal tube 3.8 cm from the carina. Right upper extremity PICC in place. 2. Low lung volumes with bibasilar atelectasis and small pleural effusions. Electronically Signed   By: Rubye Oaks M.D.   On: 08/08/2017 03:28   Dg Chest Port 1 View  Result Date: 08/06/2017 CLINICAL DATA:  Shortness of breath. EXAM: PORTABLE CHEST 1 VIEW COMPARISON:  Radiograph August 02, 2017. FINDINGS: Stable cardiomediastinal silhouette. No pneumothorax is noted. Endotracheal tube has been removed. Interval placement of right-sided PICC line with distal tip in expected position of cavoatrial junction. Mild bibasilar subsegmental atelectasis or infiltrates are noted. Probable small left pleural effusion is noted. Bony thorax is unremarkable. IMPRESSION: Interval placement of right-sided PICC line with tip in expected position of cavoatrial junction. Stable bibasilar subsegmental atelectasis or infiltrates are noted, with small left pleural effusion. Electronically Signed   By: Lupita Raider, M.D.   On: 08/06/2017 07:47   Dg Chest Port 1 View  Result Date: 08/02/2017 CLINICAL DATA:  Intubation. EXAM: PORTABLE CHEST 1 VIEW COMPARISON:  08/01/2017. FINDINGS: Interim removal of NG tube. Endotracheal tube in stable position. Left subclavian central line stable position. Heart size normal. Low lung volumes with bibasilar atelectasis. Small bilateral pleural effusions. Similar findings noted on prior exam. No pneumothorax. IMPRESSION: 1. Pole of NG tube. Endotracheal tube left subclavian central line stable position. 2. Low lung volumes with mild bibasilar atelectasis. Small bilateral pleural effusions. No change from prior exam . Electronically Signed   By: Maisie Fus  Register   On: 08/02/2017 06:21   Dg Chest Port 1 View  Result Date: 08/01/2017 CLINICAL DATA:  Respiratory failure. EXAM: PORTABLE CHEST 1 VIEW COMPARISON:  07/30/2017. FINDINGS:  Endotracheal tube, NG tube, dual-lumen right IJ catheter, left subclavian line in stable position. Heart size stable. Lung volumes with bibasilar atelectasis. Small bilateral pleural effusions. Chest is unchanged from prior exam. IMPRESSION: 1. Lines and tubes in stable position. 2. Low lung volumes with bibasilar atelectasis. Small bilateral pleural effusions. Chest is unchanged from prior exam . Electronically Signed   By: Maisie Fus  Register   On: 08/01/2017 06:51   Dg Chest Port 1 View  Result Date: 07/30/2017 CLINICAL DATA:  Respiratory failure.  Intubated. EXAM: PORTABLE CHEST 1 VIEW COMPARISON:  07/29/2017 FINDINGS:  Endotracheal tube is unchanged, positioned just below the clavicular heads and well above the carina. Right jugular and left subclavian catheters terminate over the SVC. Enteric tube courses into the left upper abdomen with tip not imaged. The cardiomediastinal silhouette is unchanged. Veiling opacities remain in both lung bases compatible with pleural effusions. Bibasilar airspace opacity, including dense retrocardiac opacity in the left lower lobe, is unchanged. No pneumothorax is identified. IMPRESSION: Unchanged pleural effusions and bibasilar atelectasis or consolidation. Electronically Signed   By: Sebastian Ache M.D.   On: 07/30/2017 06:54   Dg Chest Port 1 View  Result Date: 07/29/2017 CLINICAL DATA:  61 year old male postoperative day 5 status post repeat abdominal surgery and bowel resection for sepsis and bowel perforation. Intubated. EXAM: PORTABLE CHEST 1 VIEW COMPARISON:  07/28/2017 and earlier. FINDINGS: Portable AP semi upright view at 0412 hours. Endotracheal tube tip in good position between the clavicles and carina. Enteric tube courses to the abdomen, side hole not included. Stable dual lumen right IJ central line and single lumen left subclavian approach central line. Mediastinal contours remain normal. Veiling and confluent bibasilar pulmonary opacity persists and is  greater on the left. No pneumothorax or pulmonary edema. Stable other than perhaps mildly improved left perihilar ventilation since yesterday. IMPRESSION: 1.  Stable lines and tubes. 2. No significant change in bibasilar opacity with left lower lobe collapse or consolidation. 3. No new cardiopulmonary abnormality. Electronically Signed   By: Odessa Fleming M.D.   On: 07/29/2017 07:26   Dg Chest Port 1 View  Result Date: 07/28/2017 CLINICAL DATA:  Ventilator support EXAM: PORTABLE CHEST 1 VIEW COMPARISON:  07/27/2017 FINDINGS: Endotracheal tube tip is 5 cm above the carina. Nasogastric tube enters the abdomen. Left subclavian central line and right internal jugular central line are unchanged. Persistent bilateral effusions with lower lobe volume loss. No change since yesterday. IMPRESSION: No change. Lines and tubes satisfactory. Persistent effusions and lower lobe volume loss. Electronically Signed   By: Paulina Fusi M.D.   On: 07/28/2017 06:27   Dg Chest Port 1 View  Result Date: 07/27/2017 CLINICAL DATA:  Followup ventilator support. EXAM: PORTABLE CHEST 1 VIEW COMPARISON:  07/25/2017 FINDINGS: Endotracheal tube tip is 4 cm above the carina. Nasogastric tube enters the abdomen. Left subclavian and right internal jugular central lines are unchanged with tips in the SVC above the right atrium. Slightly less pulmonary edema. Persistent effusions and lower lobe collapse. IMPRESSION: Slightly less pulmonary edema. Persistent effusions and lower lobe collapse. Electronically Signed   By: Paulina Fusi M.D.   On: 07/27/2017 07:44   Dg Chest Port 1 View  Result Date: 07/25/2017 CLINICAL DATA:  Respiratory failure. EXAM: PORTABLE CHEST 1 VIEW COMPARISON:  07/24/2017. FINDINGS: Endotracheal tube, NG tube, right IJ line, left PICC line in stable position. Cardiomegaly with bilateral pulmonary infiltrates/edema and bilateral pleural effusions. Findings consistent congestive heart failure. No pneumothorax . IMPRESSION: 1.   Lines and tubes in stable position. 2. Cardiomegaly with bilateral pulmonary infiltrates and bilateral pleural effusions consistent with congestive heart failure. Electronically Signed   By: Maisie Fus  Register   On: 07/25/2017 08:03   Dg Chest Port 1 View  Result Date: 07/24/2017 CLINICAL DATA:  Central line placement EXAM: PORTABLE CHEST 1 VIEW COMPARISON:  Portable exam 1616 hours compared to 0420 hours FINDINGS: Tip of endotracheal tube projects 5.0 cm above carina. Nasogastric tube extends into stomach. RIGHT jugular central venous catheter with tip projecting over proximal SVC. LEFT subclavian central venous catheter with tip projecting  over mid SVC. Stable heart size mediastinal contours. Bibasilar opacities greater on LEFT question atelectasis versus consolidation. Upper lungs clear. Tiny LEFT pleural effusion. No pneumothorax. IMPRESSION: Persistent bibasilar opacities LEFT greater than RIGHT question atelectasis versus infiltrate. No pneumothorax following RIGHT jugular line placement. Electronically Signed   By: Ulyses Southward M.D.   On: 07/24/2017 16:36   Dg Chest Port 1 View  Result Date: 07/24/2017 CLINICAL DATA:  Respiratory failure EXAM: PORTABLE CHEST 1 VIEW COMPARISON:  11/25/2008 FINDINGS: Endotracheal tube, left subclavian central line and nasogastric catheter are again seen and stable. Cardiac shadow is stable. Mild bibasilar atelectasis is noted. No sizable effusion or pneumothorax is seen. IMPRESSION: Tubes and lines as described above. Mild bibasilar atelectatic changes. Electronically Signed   By: Alcide Clever M.D.   On: 07/24/2017 07:02   Dg Chest Port 1 View  Result Date: 07/23/2017 CLINICAL DATA:  Endotracheal tube placement. EXAM: PORTABLE CHEST 1 VIEW COMPARISON:  Chest x-ray from same day at 16:38. FINDINGS: Interval placement of an endotracheal tube with the tip approximately 3.6 cm above the level of the carina. Interval placement of an enteric tube entering the stomach with  the tip and distal side port beyond the field of view. Unchanged left subclavian central venous catheter with the tip at the cavoatrial junction. The left costophrenic angle is not included in the field of view. The cardiomediastinal silhouette is normal in size. Normal pulmonary vascularity. Low lung volumes are present, causing crowding of the pulmonary vasculature. No focal consolidation, pleural effusion, or pneumothorax. No acute osseous abnormality. IMPRESSION: 1. Appropriately positioned endotracheal tube. Enteric tube entering the stomach with the tip beyond the field of view. 2.  No active cardiopulmonary disease. Electronically Signed   By: Obie Dredge M.D.   On: 07/23/2017 20:53   Dg Chest Portable 1 View  Result Date: 07/23/2017 CLINICAL DATA:  Status post central line placement EXAM: PORTABLE CHEST 1 VIEW COMPARISON:  None. FINDINGS: There is a left subclavian central venous catheter with the tip projecting over the SVC. There is no focal parenchymal opacity. There is no pleural effusion or pneumothorax. The heart and mediastinal contours are unremarkable. The osseous structures are unremarkable. Pneumoperitoneum is not well delineated on the current x-ray. IMPRESSION: 1. Left subclavian central venous catheter with the tip projecting over the SVC. 2. Pneumoperitoneum is not well delineated on the current x-ray. Please refer to CT of abdomen for further characterization. Electronically Signed   By: Elige Ko   On: 07/23/2017 16:56   Dg Abd Acute W/chest  Result Date: 07/23/2017 CLINICAL DATA:  Severe abdominal pain and distention, tachycardia, shortness of breath EXAM: DG ABDOMEN ACUTE W/ 1V CHEST COMPARISON:  None FINDINGS: Normal heart size, mediastinal contours and pulmonary vascularity. Mild bibasilar atelectasis. Lungs otherwise clear. No infiltrate, pleural effusion or pneumothorax. Free intraperitoneal air consistent with perforated viscus. Nonobstructive bowel gas pattern. No  bowel dilatation or bowel wall thickening. Degenerative changes of the spine. No urinary tract calcifications. IMPRESSION: Free intraperitoneal air consistent with perforated viscus. Mild bibasilar atelectasis. Critical Value/emergent results were called by telephone at the time of interpretation on 07/23/2017 at 2:55 pm to [facility] PA, who verbally acknowledged these results. Electronically Signed   By: Ulyses Southward M.D.   On: 07/23/2017 14:56   Dg Abd Portable 1v  Result Date: 07/23/2017 CLINICAL DATA:  Incorrect sponge count. EXAM: PORTABLE ABDOMEN - 1 VIEW COMPARISON:  Radiographs of same day. FINDINGS: Distal tip of nasogastric tube is seen in expected  position of distal stomach. Mildly dilated small bowel loops are noted which may represent ileus. No radiopaque foreign body is noted. IMPRESSION: Nasogastric tube tip seen in expected position of distal stomach. No radiopaque foreign body is noted. Mildly dilated small bowel loops are noted which may represent ileus. Electronically Signed   By: Lupita Raider, M.D.   On: 07/23/2017 20:17    Labs:  CBC: Recent Labs    08/05/17 0620 08/06/17 0510 08/06/17 1458 08/07/17 0500 08/08/17 0145  WBC 23.3* 24.1*  --  21.5* 18.5*  HGB 7.3* 6.8* 7.6* 7.1* 7.1*  HCT 23.5* 21.0* 22.9* 21.7* 21.7*  PLT 980* 924*  --  703* 625*    COAGS: Recent Labs    07/23/17 1455 07/24/17 0350 07/31/17 0745 08/08/17 1329  INR 1.78 1.77 1.58 1.45    BMP: Recent Labs    08/06/17 0510 08/07/17 0500 08/07/17 2211 08/08/17 0145  NA 153* 154* 155* 153*  K 3.4* 3.5 3.6 3.6  CL >130* 127* 128* 125*  CO2 18* 23 23 21*  GLUCOSE 145* 124* 149* 191*  BUN 72* 71* 68* 65*  CALCIUM 8.8* 8.3* 8.4* 8.5*  CREATININE 2.08* 2.24* 2.05* 2.27*  GFRNONAA 32* 29* 33* 29*  GFRAA 37* 34* 38* 34*    LIVER FUNCTION TESTS: Recent Labs    08/01/17 0334 08/05/17 0620 08/07/17 2211 08/08/17 0145  BILITOT 2.7* 2.3* 2.4* 2.0*  AST 29 35 36 42*  ALT 23 28 31   33  ALKPHOS 76 112 105 111  PROT 6.3* 7.5 7.3 7.7  ALBUMIN 1.8* 2.0* 1.9* 2.1*    TUMOR MARKERS: No results for input(s): AFPTM, CEA, CA199, CHROMGRNA in the last 8760 hours.  Assessment and Plan: 61 y.o. male with history of small bowel perforation/cecal ischemia/E. coli bacteremia who underwent small bowel resection without anastomosis, wound VAC placement on 07/23/17.  He subsequently underwent reexploration on 07/25/17 with ileocecectomy, end ileostomy and closure of abdominal wall.  On 07/31/17 he returned to the OR for exploratory lap, drainage of abdominal abscesses with evacuation of pelvic hematoma and drain placements.  He is status post cardiac arrest/CPR this morning at 0130 and is now intubated.  CT scan performed yesterday revealed increasing pelvic and gutter fluid, progressive perisplenic collection/abscess, improved perihepatic collection with continued collection along the ventral left liver, trace effusions.  Request now received from surgery for CT-guided drainage of additional abd/pelvic fluid collections.  Imaging studies have been reviewed by Dr. Grace Isaac.  Current labs include WBC 18.5, hemoglobin 7.1, platelets 625k, creat 2.27.  Plan at this time is to attempt drainage of additional/accessible abdominal pelvic fluid collections.  Details/risks of procedure, including but not limited to, internal bleeding, infection, injury to adjacent structures, inability to drain fluid discussed with patient's sister Caleb Singh with her understanding and consent.  Procedure tentatively scheduled for later this afternoon.   Thank you for this interesting consult.  I greatly enjoyed meeting Caleb Singh and look forward to participating in their care.  A copy of this report was sent to the requesting provider on this date.  Electronically Signed: D. Jeananne Rama, PA-C 08/08/2017, 2:47 PM   I spent a total of  30 minutes   in face to face in clinical consultation, greater than 50% of  which was counseling/coordinating care for CT-guided abdominal/pelvic fluid collection drainage

## 2017-08-08 NOTE — Procedures (Signed)
Arterial Catheter Insertion Procedure Note Caleb LaundryFrederick Singh [id number] 02/26/1956  Procedure: Insertion of Arterial Catheter  Indications: Blood pressure monitoring  Procedure Details Consent: Unable to obtain consent because of emergent medical necessity. Time Out: Verified patient identification, verified procedure, site/side was marked, verified correct patient position, special equipment/implants available, medications/allergies/relevent history reviewed, required imaging and test results available.  Performed  Maximum sterile technique was used including antiseptics, cap, gloves, gown, hand hygiene, mask and sheet. Skin prep: Chlorhexidine; local anesthetic administered 20 gauge catheter was inserted into left femoral artery using the Seldinger technique.  Evaluation Blood flow good; BP tracing good. Complications: No apparent complications.   Caleb LarocheDennis M Singh 08/08/2017

## 2017-08-08 NOTE — Progress Notes (Signed)
Pharmacy Antibiotic Note  Caleb LaundryFrederick Murthy is a 61 y.o. male admitted on 07/23/2017 with sepsis.  Pharmacy has been consulted for Vancomycin, anidulafungin dosing.  Plan: Vancomycin 1500mg  iv q48hr Goal AUC = 400 - 500 for all indications, except meningitis (goal AUC > 500 and Cmin 15-20 mcg/mL)  Anidulafungin 200mg  iv x1, then 100mg  iv q24hr  Height: 5\' 10"  (177.8 cm) Weight: 173 lb 8 oz (78.7 kg) IBW/kg (Calculated) : 73  Temp (24hrs), Avg:99.4 F (37.4 C), Min:98.2 F (36.8 C), Max:99.9 F (37.7 C)  Recent Labs  Lab 08/04/17 0323 08/05/17 0620 08/06/17 0510 08/07/17 0500 08/07/17 2211 08/08/17 0145  WBC 27.8* 23.3* 24.1* 21.5*  --  18.5*  CREATININE 1.75* 1.65* 2.08* 2.24* 2.05* 2.27*    Estimated Creatinine Clearance: 34.4 mL/min (A) (by C-G formula based on SCr of 2.27 mg/dL (H)).    No Known Allergies  Antimicrobials this admission: 12/4 Vancomycin >> 12/5 -- 12/20 >> 12/4 Zosyn >> 12/14 -- 12/17 >> 12/14 meropenem >> 12/17 12/4 Eraxis >> 12/17 -- 12/20 >>   Dose adjustments this admission: -  Microbiology results: 12/4 BCx: Ecoli - PANSENSITIVE       12/5  BCID = E.coli, no resistance 12/10 BCx: NGF 12/11 UCx: NGF 12/12 abscess cx: E coli (PANSENSITIVE)   Thank you for allowing pharmacy to be a part of this patient's care.  Aleene DavidsonGrimsley Jr, Jonni Oelkers Crowford 08/08/2017 5:24 AM

## 2017-08-08 NOTE — Progress Notes (Signed)
8 Days Post-Op    ZW:CHENIDPOE pain    Subjective: Intubated, sedated on the ventilator, on 2 pressors, with foley back in.  Events noted.   Objective: Vital signs in last 24 hours: Temp:  [98.2 F (36.8 C)-100.2 F (37.9 C)] 100.2 F (37.9 C) (12/20 0645) Pulse Rate:  [79-154] 88 (12/20 0645) Resp:  [20-45] 26 (12/20 0645) BP: (75-176)/(41-107) 129/51 (12/20 0645) SpO2:  [91 %-100 %] 100 % (12/20 0545) Arterial Line BP: (74-169)/(43-138) 126/87 (12/20 0645) FiO2 (%):  [40 %-100 %] 40 % (12/20 0507) Weight:  [78.7 kg (173 lb 8 oz)] 78.7 kg (173 lb 8 oz) (12/20 0400) Last BM Date: 08/07/17 NPO 4600 IV 1525 urine 120 drain 550 stool Low grade fever 100 range, some hypotension and then cardiac arrest this AM Intubated/CPR, Mild troponin elevation WBC 18.5 Anemia stable CT chest yesterday:  No pneumonia CT abd: 11 x 6 cm left subdiaphragmatic abscess, hemoperitoneum, 12 x 3 cm right perihepatic collection  Intake/Output from previous day: 12/19 0701 - 12/20 0700 In: 4593.1 [I.V.:4393.1; IV Piggyback:200] Out: 2195 [Urine:1525; Drains:120; Stool:550] Intake/Output this shift: No intake/output data recorded.  General appearance: sedated, intubated, sleeping but opens his eyes when you examine him. Very hypereflexive with fasciculations with movement of the the extremities. Resp: clear to auscultation bilaterally and anterior exam GI: Open wound without any real change, dressing shows just yellow drainage.Open wound looks no better.  Both drains now have purulent fluid in them    LE:  Not much edema, Mittens both hands.   Lab Results:  Recent Labs    08/07/17 0500 08/08/17 0145  WBC 21.5* 18.5*  HGB 7.1* 7.1*  HCT 21.7* 21.7*  PLT 703* 625*    BMET Recent Labs    08/07/17 2211 08/08/17 0145  NA 155* 153*  K 3.6 3.6  CL 128* 125*  CO2 23 21*  GLUCOSE 149* 191*  BUN 68* 65*  CREATININE 2.05* 2.27*  CALCIUM 8.4* 8.5*   PT/INR No results for input(s):  LABPROT, INR in the last 72 hours.  Recent Labs  Lab 08/05/17 0620 08/07/17 2211 08/08/17 0145  AST 35 36 42*  ALT 28 31 33  ALKPHOS 112 105 111  BILITOT 2.3* 2.4* 2.0*  PROT 7.5 7.3 7.7  ALBUMIN 2.0* 1.9* 2.1*     Lipase     Component Value Date/Time   LIPASE 47 07/23/2017 1455     Medications: . chlorhexidine gluconate (MEDLINE KIT)  15 mL Mouth Rinse BID  . Chlorhexidine Gluconate Cloth  6 each Topical Daily  . feeding supplement  1 Container Oral TID BM  . folic acid  1 mg Intravenous Daily  . heparin injection (subcutaneous)  5,000 Units Subcutaneous Q8H  . insulin aspart  0-15 Units Subcutaneous Q4H  . mouth rinse  15 mL Mouth Rinse QID  . pantoprazole (PROTONIX) IV  40 mg Intravenous Q24H  . sodium chloride flush  10-40 mL Intracatheter Q12H  . thiamine injection  100 mg Intravenous Daily   . sodium chloride    . anidulafungin    . anidulafungin 200 mg (08/08/17 0518)  . dextrose 50 mL/hr at 08/08/17 0600  . fentaNYL infusion INTRAVENOUS 50 mcg/hr (08/08/17 0600)  . norepinephrine (LEVOPHED) Adult infusion 2 mcg/min (08/08/17 0600)  . piperacillin-tazobactam (ZOSYN)  IV Stopped (08/08/17 0552)  .  sodium bicarbonate  infusion 1000 mL 50 mL/hr at 08/08/17 0600  . TPN (CLINIMIX) Adult without lytes Stopped (08/08/17 0317)  . [START ON 08/10/2017] vancomycin    .  vasopressin (PITRESSIN) infusion - *FOR SHOCK* 0.03 Units/min (08/08/17 0600)   Anti-infectives (From admission, onward)   Start     Dose/Rate Route Frequency Ordered Stop   08/10/17 0500  vancomycin (VANCOCIN) 1,500 mg in sodium chloride 0.9 % 500 mL IVPB     1,500 mg 250 mL/hr over 120 Minutes Intravenous Every 48 hours 08/08/17 0728     08/08/17 2200  anidulafungin (ERAXIS) 100 mg in sodium chloride 0.9 % 100 mL IVPB     100 mg 78 mL/hr over 100 Minutes Intravenous Every 24 hours 08/08/17 0524     08/08/17 0445  vancomycin (VANCOCIN) 1,500 mg in sodium chloride 0.9 % 500 mL IVPB     1,500 mg 250  mL/hr over 120 Minutes Intravenous  Once 08/08/17 0431 08/08/17 0659   08/08/17 0430  vancomycin (VANCOCIN) 1,250 mg in sodium chloride 0.9 % 250 mL IVPB  Status:  Discontinued     1,250 mg 166.7 mL/hr over 90 Minutes Intravenous  Once 08/08/17 0418 08/08/17 0428   08/08/17 0415  anidulafungin (ERAXIS) 200 mg in sodium chloride 0.9 % 200 mL IVPB     200 mg 78 mL/hr over 200 Minutes Intravenous  Once 08/08/17 0413     08/05/17 1000  piperacillin-tazobactam (ZOSYN) IVPB 3.375 g     3.375 g 12.5 mL/hr over 240 Minutes Intravenous Every 8 hours 08/05/17 0946     08/02/17 1400  meropenem (MERREM) 1 g in sodium chloride 0.9 % 100 mL IVPB  Status:  Discontinued     1 g 200 mL/hr over 30 Minutes Intravenous Every 12 hours 08/02/17 1111 08/05/17 0945   07/29/17 0600  piperacillin-tazobactam (ZOSYN) IVPB 3.375 g  Status:  Discontinued     3.375 g 12.5 mL/hr over 240 Minutes Intravenous Every 8 hours 07/29/17 0518 08/02/17 1036   07/25/17 2000  vancomycin (VANCOCIN) IVPB 1000 mg/200 mL premix  Status:  Discontinued     1,000 mg 200 mL/hr over 60 Minutes Intravenous Every 48 hours 07/23/17 2042 07/24/17 0956   07/24/17 2200  piperacillin-tazobactam (ZOSYN) 3.375 g in dextrose 5 % 50 mL IVPB  Status:  Discontinued     3.375 g 100 mL/hr over 30 Minutes Intravenous Every 6 hours 07/24/17 2006 07/29/17 0518   07/24/17 2100  anidulafungin (ERAXIS) 100 mg in sodium chloride 0.9 % 100 mL IVPB  Status:  Discontinued     100 mg 78 mL/hr over 100 Minutes Intravenous Every 24 hours 07/23/17 2039 08/05/17 0945   07/24/17 0000  anidulafungin (ERAXIS) 100 mg in sodium chloride 0.9 % 100 mL IVPB  Status:  Discontinued     100 mg 78 mL/hr over 100 Minutes Intravenous Every 24 hours 07/23/17 2038 07/23/17 2039   07/23/17 2200  piperacillin-tazobactam (ZOSYN) IVPB 3.375 g  Status:  Discontinued     3.375 g 12.5 mL/hr over 240 Minutes Intravenous Every 8 hours 07/23/17 2043 07/24/17 2006   07/23/17 2100   anidulafungin (ERAXIS) 200 mg in sodium chloride 0.9 % 200 mL IVPB     200 mg 78 mL/hr over 200 Minutes Intravenous  Once 07/23/17 2038 07/24/17 0239   07/23/17 2100  vancomycin (VANCOCIN) 1,500 mg in sodium chloride 0.9 % 500 mL IVPB     1,500 mg 250 mL/hr over 120 Minutes Intravenous  Once 07/23/17 2041 07/23/17 2317   07/23/17 1515  piperacillin-tazobactam (ZOSYN) IVPB 3.375 g     3.375 g 100 mL/hr over 30 Minutes Intravenous  Once 07/23/17 1511 07/23/17 1611  Assessment/Plan Perforated distal small boweland ischemia of cecum 1.S/Pexploratory laparotomy, small bowel resection without anastomosis, placement of open wound VAC system 07/23/17 Dr. Randall Hiss Wilson/Dr. Fanny Skates 2. S/p Reexploration of abdomen, ileocecectomy, creation of end ileostomy,closure of abdominal wall, 07/25/17, Dr. [name] - Post op ileus  3. CT 12/11: ? Recurrent bowel perforation, ? Left hepatic abscess, ? RLL penumonia, mild dehiscence of anterior abdominal wound. Exploratory laparotomy, drainage of abdominal abscess and evacuation of pelvic hematoma, RUQ drain, and left lateral drain placement, 07/31/17, Dr. Excell Seltzer (Findings: Subdiaphragmatic and subhepatic abscesses.Large organizing pelvic hematoma. Apparent necrotic tissue left lobe of the liver.) 4.  CT scan 12/19:  Increasing pelvic and gutter fluid may be related to hematoma liquefaction or progressive infection. Areas of high-density have decreased from prior, no indication of interval hemorrhage. Progressive perisplenic collection/abscess, 11 x 6 cm.   Improved perihepatic collection after drain placement, although there is still sizable collection along the ventral left liver at 12 x 3 cm. The tip of the catheter is central to this persistent collection.   Sepsis/shock -vasopressors off - WBC rising Bacteremia - Enterobacteriaceae + E Coli AF - Asystole 07/29/17 - no Code blue-Cardizem turned off Cardiac arrest -  Code Blue 08/08/17 - reintubated/pressors/increase of antibiotics E coliBacteremia - Zosyn Acute respiratory failure - creatinine is up today 2.24 Acute renal failure-CRRT discontinued 07/27/17 Malnutrition/Decondtioning - Prealbumin <5 Anemia - transfused 1 Unit PRBC12/8/18,07/29/17, and12/11/18 - 2 PRBC (H/H today: 7.1/21.7) Hypernatremia -  Na 154 Postop ileus History of heavy alcohol use  History of tobacco use FEN: TPN/IV fluids - Ice chips with sips of clears [id number] 07/23/17- 07/24/17,restarted 12/20 day 1, Zosyn 07/23/17- 12/14, anidulafungin 07/23/17 - 12/17,restarted 12/20 day 1,  Meropenem 12/14 - 12/17 =>> Zosyn restarted 12/17 =>> day 4 FVW:AQLR - Heparin Follow-up:Dr. Greer Pickerel  Plan:  Reviewed with Dr. Kae Heller.  She has seen him and reviewed CT.  At this point we would not take him back to the OR.  If he becomes stable enough we would let IR place additional drains, but we don't have allot more to offer with any chance of success.  Dr. Kae Heller recommends NG/OG for stomach decompression also.      LOS: 16 days    Sami Roes 08/08/2017 [phone]

## 2017-08-08 NOTE — Progress Notes (Signed)
PT Cancellation Note  Patient Details Name: Caleb Singh MRN: [id number] DOB: 09-25-1955   Cancelled Treatment:    Reason Eval/Treat Not Completed: Medical issues which prohibited therapy-pt is now intubated.    Rebeca AlertJannie Tenee Wish, MPT Pager: [phone]

## 2017-08-08 NOTE — Progress Notes (Signed)
Patient seen and examined after cardiac arrest.  Patient intubated unresponsive hypotensive initial ABG shows mixed respiratory and metabolic acidosis.  Patient started on phenylephrine.  Arterial line placed.  Switch to levo fed and vasopressin.  Lactated Ringer's bolus given intra-abdominal process and not profoundly fluid positive.  Continue with bicarb drip.  Start antifungal and 1 dose of vancomycin given new sepsis concern.  Abdomen is distended mildly tense but not firm.  High specific shin for ongoing intra-abdominal pathology.  Patient had good response to vasopressors, chest x-ray shows ET tube in place and repeat ABG shows metabolic acidosis with respiratory alkalosis.  Earney Hamburgennis Lawrence, MD, critical care medicine

## 2017-08-08 NOTE — Progress Notes (Signed)
CRITICAL VALUE ALERT  Critical Value: Troponin 0.12  Date & Time Notied: 08/08/17 @ 0100  Provider Notified

## 2017-08-09 ENCOUNTER — Inpatient Hospital Stay (HOSPITAL_COMMUNITY): Payer: Non-veteran care

## 2017-08-09 LAB — BLOOD GAS, ARTERIAL
ACID-BASE EXCESS: 2 mmol/L (ref 0.0–2.0)
BICARBONATE: 25.1 mmol/L (ref 20.0–28.0)
Drawn by: 295031
FIO2: 30
O2 Saturation: 97.9 %
PATIENT TEMPERATURE: 98.6
PEEP/CPAP: 5 cmH2O
PH ART: 7.478 — AB (ref 7.350–7.450)
PO2 ART: 99.8 mmHg (ref 83.0–108.0)
RATE: 26 resp/min
VT: 580 mL
pCO2 arterial: 34.3 mmHg (ref 32.0–48.0)

## 2017-08-09 LAB — GLUCOSE, CAPILLARY
GLUCOSE-CAPILLARY: 100 mg/dL — AB (ref 65–99)
GLUCOSE-CAPILLARY: 96 mg/dL (ref 65–99)
GLUCOSE-CAPILLARY: 114 mg/dL — AB (ref 65–99)
Glucose-Capillary: 127 mg/dL — ABNORMAL HIGH (ref 65–99)
Glucose-Capillary: 115 mg/dL — ABNORMAL HIGH (ref 65–99)

## 2017-08-09 LAB — BASIC METABOLIC PANEL
ANION GAP: 5 (ref 5–15)
BUN: 78 mg/dL — ABNORMAL HIGH (ref 6–20)
CALCIUM: 7.5 mg/dL — AB (ref 8.9–10.3)
CHLORIDE: 119 mmol/L — AB (ref 101–111)
CO2: 25 mmol/L (ref 22–32)
Creatinine, Ser: 2.7 mg/dL — ABNORMAL HIGH (ref 0.61–1.24)
GFR calc Af Amer: 27 mL/min — ABNORMAL LOW (ref >60)
GFR calc non Af Amer: 24 mL/min — ABNORMAL LOW (ref >60)
GLUCOSE: 140 mg/dL — AB (ref 65–99)
Potassium: 3 mmol/L — ABNORMAL LOW (ref 3.5–5.1)
Sodium: 149 mmol/L — ABNORMAL HIGH (ref 135–145)

## 2017-08-09 MED ORDER — POTASSIUM CHLORIDE 10 MEQ/50ML IV SOLN
10.0000 meq | INTRAVENOUS | Status: AC
  Administered 2017-08-09 (×6): 10 meq via INTRAVENOUS
  Filled 2017-08-09 (×6): qty 50

## 2017-08-09 MED ORDER — TRACE MINERALS CR-CU-MN-SE-ZN 10-1000-500-60 MCG/ML IV SOLN
INTRAVENOUS | Status: AC
  Administered 2017-08-09: 17:00:00 via INTRAVENOUS
  Filled 2017-08-09: qty 2400
  Filled 2017-08-09: qty 2000

## 2017-08-09 MED ORDER — FAT EMULSION 20 % IV EMUL
180.0000 mL | INTRAVENOUS | Status: AC
  Administered 2017-08-09: 180 mL via INTRAVENOUS
  Filled 2017-08-09: qty 180

## 2017-08-09 MED ORDER — ATROPINE SULFATE 1 MG/10ML IJ SOSY
PREFILLED_SYRINGE | INTRAMUSCULAR | Status: AC
  Filled 2017-08-09: qty 10

## 2017-08-09 MED ORDER — SODIUM CHLORIDE 0.9% FLUSH
5.0000 mL | Freq: Three times a day (TID) | INTRAVENOUS | Status: DC
  Administered 2017-08-09 – 2017-08-14 (×15): 5 mL via INTRAVENOUS
  Administered 2017-08-14: 15 mL via INTRAVENOUS
  Administered 2017-08-15 – 2017-08-20 (×12): 5 mL via INTRAVENOUS

## 2017-08-09 NOTE — Progress Notes (Signed)
9 Days Post-Op    CC:  Abdominal pain  Subjective: He is more awake this AM, does not have the shakes this Am with movement.  All 3 drains are purulent fluid.  Midline looks about the same. No better or worse.  Ongoing fluid thru the ostomy.    Objective: Vital signs in last 24 hours: Temp:  [100.2 F (37.9 C)-102 F (38.9 C)] 100.2 F (37.9 C) (12/21 0700) Pulse Rate:  [87-105] 89 (12/21 0700) Resp:  [26] 26 (12/21 0700) BP: (106-173)/(45-75) 133/63 (12/21 0700) SpO2:  [100 %] 100 % (12/21 0700) Arterial Line BP: (109-254)/(59-250) 124/121 (12/21 0700) FiO2 (%):  [30 %-100 %] 30 % (12/21 0337) Weight:  [78.7 kg (173 lb 8 oz)] 78.7 kg (173 lb 8 oz) (12/21 0500) Last BM Date: 08/07/17 4918 IV 1175 urine 230 drains: drain - 1 15, drain 2 - 135, drain 3 - 80 550 colostomy Febrile up to 102 most of the day,  Down to 100.2 0700 this AM Na 149, K+ 3.0 creatinine up to 2.7  No CBC currently  Intake/Output from previous day: 12/20 0701 - 12/21 0700 In: 4918.7 [I.V.:4638.7; IV Piggyback:280] Out: 1957 [CBSWH:6759; Drains:230; Stool:550] Intake/Output this shift: No intake/output data recorded.  General appearance: alert, cooperative and still on vent but looks more comfortable Resp: clear to auscultation bilaterally and anterior exam GI: minimal distension, ostomy working, few BS, 3 drains all with purulent fluid in them, open wound remains worrisome, base with necrosis, no granulation at this point.  Lab Results:  Recent Labs    08/07/17 0500 08/08/17 0145  WBC 21.5* 18.5*  HGB 7.1* 7.1*  HCT 21.7* 21.7*  PLT 703* 625*    BMET Recent Labs    08/08/17 0145 08/09/17 0500  NA 153* 149*  K 3.6 3.0*  CL 125* 119*  CO2 21* 25  GLUCOSE 191* 140*  BUN 65* 78*  CREATININE 2.27* 2.70*  CALCIUM 8.5* 7.5*   PT/INR Recent Labs    08/08/17 1329  LABPROT 17.5*  INR 1.45    Recent Labs  Lab 08/05/17 0620 08/07/17 2211 08/08/17 0145  AST 35 36 42*  ALT 28 31 33   ALKPHOS 112 105 111  BILITOT 2.3* 2.4* 2.0*  PROT 7.5 7.3 7.7  ALBUMIN 2.0* 1.9* 2.1*     Lipase     Component Value Date/Time   LIPASE 47 07/23/2017 1455     Medications: . chlorhexidine gluconate (MEDLINE KIT)  15 mL Mouth Rinse BID  . Chlorhexidine Gluconate Cloth  6 each Topical Daily  . folic acid  1 mg Intravenous Daily  . heparin injection (subcutaneous)  5,000 Units Subcutaneous Q8H  . insulin aspart  0-15 Units Subcutaneous Q4H  . mouth rinse  15 mL Mouth Rinse QID  . pantoprazole (PROTONIX) IV  40 mg Intravenous Q24H  . sodium chloride flush  10-40 mL Intracatheter Q12H  . thiamine injection  100 mg Intravenous Daily   . sodium chloride    . anidulafungin Stopped (08/09/17 0015)  . dextrose 50 mL/hr at 08/09/17 0730  . fentaNYL infusion INTRAVENOUS 275 mcg/hr (08/09/17 0730)  . norepinephrine (LEVOPHED) Adult infusion Stopped (08/08/17 0915)  . piperacillin-tazobactam (ZOSYN)  IV Stopped (08/09/17 0654)  .  sodium bicarbonate  infusion 1000 mL 50 mL/hr at 08/09/17 0730  . TPN (CLINIMIX) Adult without lytes 100 mL/hr at 08/09/17 0730  . [START ON 08/10/2017] vancomycin    . vasopressin (PITRESSIN) infusion - *FOR SHOCK* Stopped (08/08/17 1024)  Anti-infectives (From admission, onward)   Start     Dose/Rate Route Frequency Ordered Stop   08/10/17 0500  vancomycin (VANCOCIN) 1,500 mg in sodium chloride 0.9 % 500 mL IVPB     1,500 mg 250 mL/hr over 120 Minutes Intravenous Every 48 hours 08/08/17 0728     08/08/17 2200  anidulafungin (ERAXIS) 100 mg in sodium chloride 0.9 % 100 mL IVPB     100 mg 78 mL/hr over 100 Minutes Intravenous Every 24 hours 08/08/17 0524     08/08/17 0445  vancomycin (VANCOCIN) 1,500 mg in sodium chloride 0.9 % 500 mL IVPB     1,500 mg 250 mL/hr over 120 Minutes Intravenous  Once 08/08/17 0431 08/08/17 0659   08/08/17 0430  vancomycin (VANCOCIN) 1,250 mg in sodium chloride 0.9 % 250 mL IVPB  Status:  Discontinued     1,250 mg 166.7 mL/hr  over 90 Minutes Intravenous  Once 08/08/17 0418 08/08/17 0428   08/08/17 0415  anidulafungin (ERAXIS) 200 mg in sodium chloride 0.9 % 200 mL IVPB     200 mg 78 mL/hr over 200 Minutes Intravenous  Once 08/08/17 0413 08/08/17 0816   08/05/17 1000  piperacillin-tazobactam (ZOSYN) IVPB 3.375 g     3.375 g 12.5 mL/hr over 240 Minutes Intravenous Every 8 hours 08/05/17 0946     08/02/17 1400  meropenem (MERREM) 1 g in sodium chloride 0.9 % 100 mL IVPB  Status:  Discontinued     1 g 200 mL/hr over 30 Minutes Intravenous Every 12 hours 08/02/17 1111 08/05/17 0945   07/29/17 0600  piperacillin-tazobactam (ZOSYN) IVPB 3.375 g  Status:  Discontinued     3.375 g 12.5 mL/hr over 240 Minutes Intravenous Every 8 hours 07/29/17 0518 08/02/17 1036   07/25/17 2000  vancomycin (VANCOCIN) IVPB 1000 mg/200 mL premix  Status:  Discontinued     1,000 mg 200 mL/hr over 60 Minutes Intravenous Every 48 hours 07/23/17 2042 07/24/17 0956   07/24/17 2200  piperacillin-tazobactam (ZOSYN) 3.375 g in dextrose 5 % 50 mL IVPB  Status:  Discontinued     3.375 g 100 mL/hr over 30 Minutes Intravenous Every 6 hours 07/24/17 2006 07/29/17 0518   07/24/17 2100  anidulafungin (ERAXIS) 100 mg in sodium chloride 0.9 % 100 mL IVPB  Status:  Discontinued     100 mg 78 mL/hr over 100 Minutes Intravenous Every 24 hours 07/23/17 2039 08/05/17 0945   07/24/17 0000  anidulafungin (ERAXIS) 100 mg in sodium chloride 0.9 % 100 mL IVPB  Status:  Discontinued     100 mg 78 mL/hr over 100 Minutes Intravenous Every 24 hours 07/23/17 2038 07/23/17 2039   07/23/17 2200  piperacillin-tazobactam (ZOSYN) IVPB 3.375 g  Status:  Discontinued     3.375 g 12.5 mL/hr over 240 Minutes Intravenous Every 8 hours 07/23/17 2043 07/24/17 2006   07/23/17 2100  anidulafungin (ERAXIS) 200 mg in sodium chloride 0.9 % 200 mL IVPB     200 mg 78 mL/hr over 200 Minutes Intravenous  Once 07/23/17 2038 07/24/17 0239   07/23/17 2100  vancomycin (VANCOCIN) 1,500 mg in  sodium chloride 0.9 % 500 mL IVPB     1,500 mg 250 mL/hr over 120 Minutes Intravenous  Once 07/23/17 2041 07/23/17 2317   07/23/17 1515  piperacillin-tazobactam (ZOSYN) IVPB 3.375 g     3.375 g 100 mL/hr over 30 Minutes Intravenous  Once 07/23/17 1511 07/23/17 1611     Assessment/Plan Perforated distal small boweland ischemia of cecum 1.S/Pexploratory laparotomy,  small bowel resection without anastomosis, placement of open wound VAC system 07/23/17 Dr. Randall Hiss Wilson/Dr. Fanny Skates 2. S/p Reexploration of abdomen, ileocecectomy, creation of end ileostomy,closure of abdominal wall, 07/25/17, Dr. [name] - Post op ileus  3. CT 12/11: ? Recurrent bowel perforation, ? Left hepatic abscess, ? RLL penumonia, mild dehiscence of anterior abdominal wound. Exploratory laparotomy, drainage of abdominal abscess and evacuation of pelvic hematoma, RUQ drain, and left lateral drain placement, 07/31/17, Dr. Excell Seltzer (Findings: Subdiaphragmatic and subhepatic abscesses.Large organizing pelvic hematoma. Apparent necrotic tissue left lobe of the liver.) 4.  CT scan 12/19:  Increasing pelvic and gutter fluid may be related to hematoma liquefaction or progressive infection. Areas of high-density have decreased from prior, no indication of interval hemorrhage. Progressive perisplenic collection/abscess, 11 x 6 cm.   Improved perihepatic collection after drain placement, although there is still sizable collection along the ventral left liver at 12 x 3 cm. The tip of the catheter is central to this persistent collection. IR 10 Fr drain LUQ - 50 ml purulent fluid 08/08/17  Sepsis/shock -vasopressors off - WBC rising Bacteremia - Enterobacteriaceae + E Coli AF - Asystole 07/29/17 - no Code blue-Cardizem turned off Cardiac arrest - Code Blue 08/08/17 - reintubated/pressors/increase of antibiotics E coliBacteremia - Zosyn Acute respiratory failure - Acute renal failure-CRRT  discontinued 07/27/17  - creatinine rising again 2.70 today Malnutrition/Decondtioning - Prealbumin <5 Anemia - transfused 1 Unit PRBC12/8/18,07/29/17, and12/11/18 - 2 PRBC(H/H today: 7.1/21.7) Hypernatremia - Na 154 Postop ileus History of heavy alcohol use  History of tobacco use FEN: TPN/IV fluids - Ice chips with sips of clears PO:EUMPNTIRWE 07/23/17- 07/24/17,restarted 12/20 day 1, Zosyn 07/23/17- 12/14,anidulafungin 07/23/17 - 12/17,restarted 12/20 day 1,  Meropenem 12/14 - 12/17 =>> Zosyn restarted 12/17 =>> day 5  anidulafungin restarted 12/20 =>> day 2 Vancomycin restarted 12/20 =>> day 2 RXV:QMGQ - Heparin Follow-up:Dr. Greer Pickerel  Plan:  Continue medical management, he looks allot better this AM.        LOS: 17 days    Cienna Dumais 08/09/2017 [phone]

## 2017-08-09 NOTE — Progress Notes (Signed)
Referring Physician(s): Byrum,R  Supervising Physician: Irish Lack  Patient Status:  [facility] - In-pt  Chief Complaint:  Left abdominal abscess  Subjective: Pt intubated, restless, episode of bradycardia earlier, now in mid 70's   Allergies: Patient has no known allergies.  Medications: Prior to Admission medications   Medication Sig Start Date End Date Taking? Authorizing Provider  clindamycin (CLEOCIN) 300 MG capsule Take 1 capsule (300 mg total) by mouth 3 (three) times daily. Patient not taking: Reported on 07/23/2017 04/21/16   Dominica Severin, MD  oxyCODONE (OXY IR/ROXICODONE) 5 MG immediate release tablet Take 1-2 tablets (5-10 mg total) by mouth every 3 (three) hours as needed for moderate pain. Patient not taking: Reported on 07/23/2017 04/21/16   Dominica Severin, MD     Vital Signs: BP (!) 178/59 (BP Location: Left Arm)   Pulse (!) 101   Temp (!) 100.9 F (38.3 C) (Core (Comment))   Resp (!) 27   Ht 5\' 10"  (1.778 m)   Wt 173 lb 8 oz (78.7 kg)   SpO2 100%   BMI 24.89 kg/m   Physical Exam left abd drain intact, output 80 cc yesterday, 40 + cc light brown fluid today; cx pend; abd dist  Imaging: Ct Abdomen Pelvis Wo Contrast  Result Date: 08/07/2017 CLINICAL DATA:  Abdominal distention. History perforated and ischemic cecum. Exploratory laparotomy for abdominal abscess and pelvic hematoma. EXAM: CT CHEST, ABDOMEN AND PELVIS WITHOUT CONTRAST TECHNIQUE: Multidetector CT imaging of the chest, abdomen and pelvis was performed following the standard protocol without IV contrast. COMPARISON:  Abdomen and pelvis CT from 8 days ago FINDINGS: CT CHEST FINDINGS Cardiovascular: Normal heart size. No pericardial effusion. PICC on the right with tip at the SVC. Mediastinum/Nodes: Negative for adenopathy. Lungs/Pleura: Multi segment lower lobe atelectasis and trace pleural effusions. Musculoskeletal: No acute or aggressive finding. CT ABDOMEN PELVIS FINDINGS Hepatobiliary:  Collection along the left lobe described below. No primary hepatic finding. High-density gallbladder in this patient is NPO per chart. Pancreas: Negative Spleen: Subcapsular collection extending left subdiaphragmatic, 11 x 6 cm compared to 8 x 5 cm previously. Adrenals/Urinary Tract: Right renal cystic density. No hydronephrosis. High-density lesion lower left kidney compatible with proteinaceous cyst. Thick appearance of the bladder dome which may be from underdistention. No surrounding inflammation. Recent catheterization with small volume pneumaturia. Stomach/Bowel: Recent perforated cecum with small bowel and cecal resection. 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 No obstruction or ileus changes. Previously administered oral contrast seen in the excluded colon. Vascular/Lymphatic: Atherosclerotic calcification. No acute vascular finding. Reproductive: Negative Other: Hemoperitoneum seen throughout the gutters, interloop spaces, pelvis, and bilateral of shallow inguinal hernias. The clot has become more hazy and ill-defined consistent with liquefaction. Collections in the gutters have increased in volume which may be from liquefaction or superimposed progressive infection. A drain has been placed along the superficial liver. Although in close continuity a left ventral perihepatic collection is still large at 12 x 3 cm. fluid around the right liver is significantly improved. Small volume fluid in the hepatic renal fossa. Trace pneumoperitoneum not unexpected after recent surgeries. Musculoskeletal: No acute finding. Severe lumbar disc and facet degeneration Open midline wound. IMPRESSION: 1. History of hemoperitoneum and peritoneal abscess. Increasing pelvic and gutter fluid may be related to hematoma liquefaction or progressive infection. Areas of high-density have decreased from prior, no indication of interval hemorrhage. 2. Progressive perisplenic collection/abscess, 11 x 6 cm. 3.  Improved perihepatic collection after drain placement, although there is still sizable  collection along the ventral left liver at 12 x 3 cm. The tip of the catheter is central to this persistent collection. 4. Postoperative bowel without ileus or obstruction. 5. Lower lobe atelectasis and trace effusions. No consolidating pneumonia. 6. Open laparotomy wound.  No undermining or collection. Electronically Signed   By: Marnee Spring M.D.   On: 08/07/2017 15:57   Ct Chest Wo Contrast  Result Date: 08/07/2017 CLINICAL DATA:  Abdominal distention. History perforated and ischemic cecum. Exploratory laparotomy for abdominal abscess and pelvic hematoma. EXAM: CT CHEST, ABDOMEN AND PELVIS WITHOUT CONTRAST TECHNIQUE: Multidetector CT imaging of the chest, abdomen and pelvis was performed following the standard protocol without IV contrast. COMPARISON:  Abdomen and pelvis CT from 8 days ago FINDINGS: CT CHEST FINDINGS Cardiovascular: Normal heart size. No pericardial effusion. PICC on the right with tip at the SVC. Mediastinum/Nodes: Negative for adenopathy. Lungs/Pleura: Multi segment lower lobe atelectasis and trace pleural effusions. Musculoskeletal: No acute or aggressive finding. CT ABDOMEN PELVIS FINDINGS Hepatobiliary: Collection along the left lobe described below. No primary hepatic finding. High-density gallbladder in this patient is NPO per chart. Pancreas: Negative Spleen: Subcapsular collection extending left subdiaphragmatic, 11 x 6 cm compared to 8 x 5 cm previously. Adrenals/Urinary Tract: Right renal cystic density. No hydronephrosis. High-density lesion lower left kidney compatible with proteinaceous cyst. Thick appearance of the bladder dome which may be from underdistention. No surrounding inflammation. Recent catheterization with small volume pneumaturia. Stomach/Bowel: Recent perforated cecum with small bowel and cecal resection. 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 No  obstruction or ileus changes. Previously administered oral contrast seen in the excluded colon. Vascular/Lymphatic: Atherosclerotic calcification. No acute vascular finding. Reproductive: Negative Other: Hemoperitoneum seen throughout the gutters, interloop spaces, pelvis, and bilateral of shallow inguinal hernias. The clot has become more hazy and ill-defined consistent with liquefaction. Collections in the gutters have increased in volume which may be from liquefaction or superimposed progressive infection. A drain has been placed along the superficial liver. Although in close continuity a left ventral perihepatic collection is still large at 12 x 3 cm. fluid around the right liver is significantly improved. Small volume fluid in the hepatic renal fossa. Trace pneumoperitoneum not unexpected after recent surgeries. Musculoskeletal: No acute finding. Severe lumbar disc and facet degeneration Open midline wound. IMPRESSION: 1. History of hemoperitoneum and peritoneal abscess. Increasing pelvic and gutter fluid may be related to hematoma liquefaction or progressive infection. Areas of high-density have decreased from prior, no indication of interval hemorrhage. 2. Progressive perisplenic collection/abscess, 11 x 6 cm. 3. Improved perihepatic collection after drain placement, although there is still sizable collection along the ventral left liver at 12 x 3 cm. The tip of the catheter is central to this persistent collection. 4. Postoperative bowel without ileus or obstruction. 5. Lower lobe atelectasis and trace effusions. No consolidating pneumonia. 6. Open laparotomy wound.  No undermining or collection. Electronically Signed   By: Marnee Spring M.D.   On: 08/07/2017 15:57   Dg Chest Port 1 View  Result Date: 08/09/2017 CLINICAL DATA:  Respiratory failure. EXAM: PORTABLE CHEST 1 VIEW COMPARISON:  Radiograph of August 08, 2017. FINDINGS: Stable cardiomediastinal silhouette. Endotracheal tube is in grossly  good position. Right-sided PICC line is unchanged in position. No pneumothorax is noted. Mild bibasilar subsegmental atelectasis is noted with probable minimal pleural effusions. Bony thorax is unremarkable. IMPRESSION: Endotracheal tube in grossly good position. Stable bibasilar subsegmental atelectasis with minimal pleural effusions. Electronically Signed   By: Roque Lias  Montez HagemanJr, M.D.   On: 08/09/2017 08:50   Dg Chest Port 1 View  Result Date: 08/08/2017 CLINICAL DATA:  Post intubation. EXAM: PORTABLE CHEST 1 VIEW COMPARISON:  CT yesterday FINDINGS: Endotracheal tube 3.8 cm from the carina. Right upper extremity PICC tip in the distal SVC. Low lung volumes. Mild cardiomegaly. Small bilateral pleural effusions and associated atelectasis. No pulmonary edema or pneumothorax. IMPRESSION: 1. Endotracheal tube 3.8 cm from the carina. Right upper extremity PICC in place. 2. Low lung volumes with bibasilar atelectasis and small pleural effusions. Electronically Signed   By: Rubye OaksMelanie  Ehinger M.D.   On: 08/08/2017 03:28   Dg Chest Port 1 View  Result Date: 08/06/2017 CLINICAL DATA:  Shortness of breath. EXAM: PORTABLE CHEST 1 VIEW COMPARISON:  Radiograph August 02, 2017. FINDINGS: Stable cardiomediastinal silhouette. No pneumothorax is noted. Endotracheal tube has been removed. Interval placement of right-sided PICC line with distal tip in expected position of cavoatrial junction. Mild bibasilar subsegmental atelectasis or infiltrates are noted. Probable small left pleural effusion is noted. Bony thorax is unremarkable. IMPRESSION: Interval placement of right-sided PICC line with tip in expected position of cavoatrial junction. Stable bibasilar subsegmental atelectasis or infiltrates are noted, with small left pleural effusion. Electronically Signed   By: Lupita RaiderJames  Green Jr, M.D.   On: 08/06/2017 07:47   Ct Image Guided Drainage By Percutaneous Catheter  Result Date: 08/08/2017 INDICATION: History of small bowel  perforation, cecal ischemia and hemoperitoneum with multiple recent intra-abdominal surgery is including creation of an ostomy in wound VAC placement. Patient remains critically ill now with persistent fluid collection within the left upper abdominal quadrant. Please perform percutaneous drainage catheter placement for infection source control purposes. EXAM: CT IMAGE GUIDED DRAINAGE BY PERCUTANEOUS CATHETER COMPARISON:  CT chest, abdomen and pelvis - 08/07/2017 MEDICATIONS: The patient is currently admitted to the hospital and receiving intravenous antibiotics. The antibiotics were administered within an appropriate time frame prior to the initiation of the procedure. ANESTHESIA/SEDATION: Moderate (conscious) sedation was employed during this procedure. A total of Versed 1 mg and Fentanyl 50 mcg was administered intravenously. Moderate Sedation Time: 14 minutes. The patient's level of consciousness and vital signs were monitored continuously by radiology nursing throughout the procedure under my direct supervision. CONTRAST:  None COMPLICATIONS: None immediate. PROCEDURE: Informed written consent was obtained from the patient's family after a discussion of the risks, benefits and alternatives to treatment. The patient was placed prone on the CT gantry and a pre procedural CT was performed re-demonstrating the known abscess/fluid collection within the left upper abdominal quadrant adjacent to the spleen with dominant lenticular component measuring approximately 12.0 x 4.1 cm (image 20, series 2). The procedure was planned. A timeout was performed prior to the initiation of the procedure. The skin overlying the left upper abdomen was prepped and draped in the usual sterile fashion. The overlying soft tissues were anesthetized with 1% lidocaine with epinephrine. Appropriate trajectory was planned with the use of a 22 gauge spinal needle. An 18 gauge trocar needle was advanced into the abscess/fluid collection and a  short Amplatz super stiff wire was coiled within the collection. Appropriate positioning was confirmed with a limited CT scan. The tract was serially dilated allowing placement of a 10 [place] all-purpose drainage catheter. Appropriate positioning was confirmed with a limited postprocedural CT scan. Approximately 50 cc of purulent fluid was aspirated. The tube was connected to a JP bulb and sutured in place. A dressing was placed. The patient tolerated the procedure well without immediate post  procedural complication. IMPRESSION: Successful CT guided placement of a 10 French all purpose drain catheter into the left upper abdominal with aspiration of 50 cc of purulent fluid. Samples were sent to the laboratory as requested by the ordering clinical team. Electronically Signed   By: Simonne ComeJohn  Watts M.D.   On: 08/08/2017 18:18    Labs:  CBC: Recent Labs    08/05/17 0620 08/06/17 0510 08/06/17 1458 08/07/17 0500 08/08/17 0145  WBC 23.3* 24.1*  --  21.5* 18.5*  HGB 7.3* 6.8* 7.6* 7.1* 7.1*  HCT 23.5* 21.0* 22.9* 21.7* 21.7*  PLT 980* 924*  --  703* 625*    COAGS: Recent Labs    07/23/17 1455 07/24/17 0350 07/31/17 0745 08/08/17 1329  INR 1.78 1.77 1.58 1.45    BMP: Recent Labs    08/07/17 0500 08/07/17 2211 08/08/17 0145 08/09/17 0500  NA 154* 155* 153* 149*  K 3.5 3.6 3.6 3.0*  CL 127* 128* 125* 119*  CO2 23 23 21* 25  GLUCOSE 124* 149* 191* 140*  BUN 71* 68* 65* 78*  CALCIUM 8.3* 8.4* 8.5* 7.5*  CREATININE 2.24* 2.05* 2.27* 2.70*  GFRNONAA 29* 33* 29* 24*  GFRAA 34* 38* 34* 27*    LIVER FUNCTION TESTS: Recent Labs    08/01/17 0334 08/05/17 0620 08/07/17 2211 08/08/17 0145  BILITOT 2.7* 2.3* 2.4* 2.0*  AST 29 35 36 42*  ALT 23 28 31  33  ALKPHOS 76 112 105 111  PROT 6.3* 7.5 7.3 7.7  ALBUMIN 1.8* 2.0* 1.9* 2.1*    Assessment and Plan: Hx SB perf, E coli bacteremia, mult abd abscesses/mult abd surgeries with ileocecectomy/end ileostomy; s/p LUQ abscess drain 12/20;  temp 100.8, creat 2.7(2.27), K 3.0- replace; LUQ fluid cx pend, most likely e coli; cont close monitoring of outputs/labs; recheck CT once output diminished or if status worsens clinically; other plans as per CCM/CCS   Electronically Signed: D. Jeananne RamaKevin Kashton Mcartor, PA-C 08/09/2017, 2:07 PM   I spent a total of 15 minutes at the the patient's bedside AND on the patient's hospital floor or unit, greater than 50% of which was counseling/coordinating care for left abdominal drain    Patient ID: Caleb Singh, male   DOB: Oct 02, 1955, 61 y.o.   MRN: [id number]

## 2017-08-09 NOTE — Progress Notes (Signed)
OT Cancellation Note  Patient Details Name: Caleb LaundryFrederick Singh MRN: [id number] DOB: Aug 01, 1956   Cancelled Treatment:    Reason Eval/Treat Not Completed: Medical issues which prohibited therapy.  Pt is on vent. Will check back next week.  [name] 08/09/2017, 7:12 AM  [name], [name] [phone] 08/09/2017

## 2017-08-09 NOTE — Progress Notes (Signed)
Nutrition Follow-up  DOCUMENTATION CODES:   Not applicable  INTERVENTION:  - Continue TPN per Pharmacy. - Will continue to monitor for ability to transition to TF in the future, if pt remains intubated.   NUTRITION DIAGNOSIS:   Inadequate oral intake related to inability to eat as evidenced by NPO status. -ongoing  GOAL:   Patient will meet greater than or equal to 90% of their needs - met with TPN regimen  MONITOR:   Vent status, Weight trends, Labs, Skin, I & O's, Other (Comment)(TPN regimen)  ASSESSMENT:   61 y.o. male with no significant past medical history complaining of severely worsening abdominal distention, pain, nonbloody, nonbilious, non-coffee-ground emesis and lack of bowel movements over the course of the last 3 days no BM in 48 hours, feels like he needs to deficate.  He denies prior abdominal surgeries, no fever or chills.  Patient is unsure if he is passing flatus.  He is never had any liver issues he does drink regularly but has never had any seizures or hallucinations when he does not drink, he has not had any alcohol in approximately 1 week.  He is never seen a gastroenterologist.   Surgeries and events: 12/4:perforated viscus involving distal small bowel s/p ex lap with small bowel resection and abdominal wound vac placement; no anastomosis, peritonitis with E. Coli bacteremia. 12/6:re-exploration and creation of ileostomy, closure of abdominal wall; CRRT initiated 12/7:TPN initiated 12/9:CRRT stopped 12/12:subdiaphragmatic and subhepatic abscesses, large pelvic hematoma aand necrotic L liver lobe; re-exploration, drainage of abdominal abscess, evacuation of pelvic hematoma and drain placement 12/14:extubated, NGT removed 12/17:D5 IVF added outside of TPN d/t hypernatremia 12/20: re-intubated d/t unresponsive event    12/21 Pt remains intubated with no OGT/NGT in place. Spoke with RN yesterday after writing note and she confirmed that TPN had been  stopped during Code and had simply not been turned back on. Pt with triple lumen PICC in R brachial. He is receiving Clinimix 5/15 @ 100 mL/hr with 20% ILE @ 15 mL/hr x12 hours. This provides 120 grams of protein, 2064 kcal (91% re-estimated kcal need). Weight stable. Yesterday evening pt had CT-guided drain placement to LUQ and 50cc fluid drained at time of placement.   Patient is currently intubated on ventilator support MV: 14.7 L/min Temp (24hrs), Avg:100.8 F (38.2 C), Min:100.2 F (37.9 C), Max:102 F (38.9 C) BP: 143/68 and MAP: 89  Medications reviewed; 1 mg IV folic acid/day, sliding scale Novolog, 40 mg IV Protonix/day, 10 mEq IV KCl x6 runs today, 100 mg IV thiamine/day. Labs reviewed; CBGs: 100 and 96 mg/dL, Na: 149 mmol/L, K: 3 mmol/L, Cl: 119 mmol/L, BUN: 78 mg/dL, creatinine: 2.7 mg/dL, Ca: 7.5 mg/dL, GFR: 27 mL/min.  IVF: D5-150 mEq sodium bicarb @ 50 mL/hr (204 kcal) Drip: Fentanyl @ 275 mcg/hr.      12/20 - Pt found to be unresponsive by RN around 1:30 this AM and pt was subsequently re-intubated. - Repeat ABG showed metabolic acidosis and respiratory alkalosis.  - PICC remains in place and TPN hanging but not infusing at this time; suspect this is related to events that occurred overnight.  - Updated estimated needs at this time d/t intubation.  - Current weight is consistent with admission weight (weight has fluctuated often throughout hospitalization) so used current weight for re-estimation.  - Repeated NFPE.  - Do not feel comfortable stating malnutrition at this time but will continue to monitor closely and will re-evaluate as warranted in the future.  Patient is currently  intubated on ventilator support MV: 15 L/min Temp (24hrs), Avg:99.8 F (37.7 C), Min:98.2 F (36.8 C), Max:100.8 F (38.2 C) Propofol: none BP: 108/46 and MAP: 65  Labs; Na: 15 mmol/L, Cl: 125 mmol/L,  Phos: 5.4 mg/dL  IVF: D5-150 mEq sodium bicarb @ 50 mL/hr (204 kcal) Drips: Vaso @  0.02 units/min, Fentanyl  150 mcg/hr.     12/18 - Pt remains NPO but starting yesterday was allowed ice chips flavored with clear liquids.  - Pt with mumbled speech and is difficult to understand; no family/visitors present. - RN reports that last night pt ended up having 2 full cartons of Boost Breeze mixed with his ice chips and that he has been complaining of abdominal pain ever since.  - Pt nods to confirm this information.  - Weight continues to trend down.  - Will repeat NFPE at follow-up d/t this significant change. - Central line was removed and PICC placed in R brachial on 12/16.  - Pt receiving Clinimix 5/15 @ 100 mL/hr with 20% ILE @ 15 mL/hr x12 mL/hr.  - This regimen provides 120 grams of protein and 2064 kcal.   Labs; Na: 153 mmol/L, K: 3.4 mmol/L, Cl: >130 mmol/L, Mg: 2.5 mg/dL IVF:D5 @ 50 mL/hr (204 kcal)     Diet Order:  TPN (CLINIMIX) Adult without lytes TPN (CLINIMIX) Adult without lytes  EDUCATION NEEDS:   No education needs have been identified at this time  Skin:  Skin Assessment: Skin Integrity Issues: Skin Integrity Issues:: Incisions, Stage II Stage II: scrotum Incisions: abdominal from surgeries 12/4, 12/6, and 12/12  Last BM:  12/21  Height:   Ht Readings from Last 1 Encounters:  08/08/17 _0  (1.778 m)    Weight:   Wt Readings from Last 1 Encounters:  08/09/17 173 lb 8 oz (78.7 kg)    Ideal Body Weight:  75.45 kg  BMI:  Body mass index is 24.89 kg/m.  Estimated Nutritional Needs:   Kcal:  2269  Protein:  110-126 grams (1.4-1.6 grams/kg)  Fluid:  >2 L/day     Jarome Matin, MS, RD, LDN, [facility] In[facility] Clinical Dietitian Pager # [phone] After hours/weekend pager # [phone]

## 2017-08-09 NOTE — Progress Notes (Signed)
PULMONARY / CRITICAL CARE MEDICINE   Name: Caleb Singh MRN: [id number] DOB: 1955-09-01    ADMISSION DATE:  07/23/2017 CONSULTATION DATE: 07/23/2017   CHIEF COMPLAINT: Abdominal pain  BRIEF SUMMARY: 61 y/o male former smoker with severe abdominal pain, and found to have free air in abdomen from perforated small bowel and ischemic cecum.  S/P resection of terminal ileum for ischemia.  Required re-look exploratory laparotomy with drainage of abdominal abscesses and evacuation of pelvic hematoma.    PMHx of ETOH, PE.  SUBJECTIVE:   PEA arrest 12/20 overnight, intubated. >> Stable 12/21, off pressors, following commands and nodding appropriately    VITAL SIGNS: BP (!) 130/36   Pulse 90   Temp (!) 100.4 F (38 C)   Resp (!) 26   Ht 5\' 10"  (1.778 m)   Wt 173 lb 8 oz (78.7 kg)   SpO2 100%   BMI 24.89 kg/m   INTAKE / OUTPUT: I/O last 3 completed shifts: In: 6930.9 [I.V.:6650.9; IV Piggyback:280] Out: 2930 [Urine:1800; Drains:230; Stool:900]  PHYSICAL EXAMINATION:  General: critically ill appearing male on mechanical ventilation  HEENT: MM pink/moist, no jvd, ETT  Neuro: alert, following commands, MAE x 4 CV: s1s2 rrr, no m/r/g PULM: even/non-labored, lungs bilaterally clear to rhonchi   GI: mild distention, midline incision dressed, jp drains x3 with purulent drainage noted Extremities: warm/dry, trace to 1+ LE edema  Skin: no rashes or lesions, midline incision  PULMONARY Recent Labs  Lab 08/06/17 0957 08/07/17 0024 08/08/17 0133 08/08/17 0238  PHART 7.448 7.403  --  7.424  PCO2ART 25.2* 32.3  --  32.9  PO2ART 86.6 112*  --  304*  HCO3 17.1* 19.7* 22.6 21.2  O2SAT 96.6 98.0 77.9 99.8    CBC Recent Labs  Lab 08/06/17 0510 08/06/17 1458 08/07/17 0500 08/08/17 0145  HGB 6.8* 7.6* 7.1* 7.1*  HCT 21.0* 22.9* 21.7* 21.7*  WBC 24.1*  --  21.5* 18.5*  PLT 924*  --  703* 625*    COAGULATION Recent Labs  Lab 08/08/17 1329  INR 1.45    CARDIAC   Recent  Labs  Lab 08/07/17 2331 08/08/17 0145 08/08/17 1120  TROPONINI 0.12* 0.13* 0.17*   No results for input(s): PROBNP in the last 168 hours.   CHEMISTRY Recent Labs  Lab 08/04/17 0323 08/05/17 0620 08/06/17 0510 08/07/17 0500 08/07/17 2211 08/08/17 0145 08/09/17 0500  NA 149* 155* 153* 154* 155* 153* 149*  K 3.2* 3.7 3.4* 3.5 3.6 3.6 3.0*  CL 119* 127* >130* 127* 128* 125* 119*  CO2 22 20* 18* 23 23 21* 25  GLUCOSE 150* 139* 145* 124* 149* 191* 140*  BUN 63* 65* 72* 71* 68* 65* 78*  CREATININE 1.75* 1.65* 2.08* 2.24* 2.05* 2.27* 2.70*  CALCIUM 8.3* 8.8* 8.8* 8.3* 8.4* 8.5* 7.5*  MG 2.0 2.5* 2.5* 2.3  --  2.6*  --   PHOS 2.3* 3.3 3.0 3.9  --  5.4*  --    Estimated Creatinine Clearance: 28.9 mL/min (A) (by C-G formula based on SCr of 2.7 mg/dL (H)).   LIVER Recent Labs  Lab 08/05/17 0620 08/07/17 2211 08/08/17 0145 08/08/17 1329  AST 35 36 42*  --   ALT 28 31 33  --   ALKPHOS 112 105 111  --   BILITOT 2.3* 2.4* 2.0*  --   PROT 7.5 7.3 7.7  --   ALBUMIN 2.0* 1.9* 2.1*  --   INR  --   --   --  1.45  INFECTIOUS No results for input(s): LATICACIDVEN, PROCALCITON in the last 168 hours.   ENDOCRINE CBG (last 3)  Recent Labs    08/08/17 2356 08/09/17 0307 08/09/17 0729  GLUCAP 122* 100* 96    IMAGING Ct Abdomen Pelvis Wo Contrast  Result Date: 08/07/2017 CLINICAL DATA:  Abdominal distention. History perforated and ischemic cecum. Exploratory laparotomy for abdominal abscess and pelvic hematoma. EXAM: CT CHEST, ABDOMEN AND PELVIS WITHOUT CONTRAST TECHNIQUE: Multidetector CT imaging of the chest, abdomen and pelvis was performed following the standard protocol without IV contrast. COMPARISON:  Abdomen and pelvis CT from 8 days ago FINDINGS: CT CHEST FINDINGS Cardiovascular: Normal heart size. No pericardial effusion. PICC on the right with tip at the SVC. Mediastinum/Nodes: Negative for adenopathy. Lungs/Pleura: Multi segment lower lobe atelectasis and trace  pleural effusions. Musculoskeletal: No acute or aggressive finding. CT ABDOMEN PELVIS FINDINGS Hepatobiliary: Collection along the left lobe described below. No primary hepatic finding. High-density gallbladder in this patient is NPO per chart. Pancreas: Negative Spleen: Subcapsular collection extending left subdiaphragmatic, 11 x 6 cm compared to 8 x 5 cm previously. Adrenals/Urinary Tract: Right renal cystic density. No hydronephrosis. High-density lesion lower left kidney compatible with proteinaceous cyst. Thick appearance of the bladder dome which may be from underdistention. No surrounding inflammation. Recent catheterization with small volume pneumaturia. Stomach/Bowel: Recent perforated cecum with small bowel and cecal resection. 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 No obstruction or ileus changes. Previously administered oral contrast seen in the excluded colon. Vascular/Lymphatic: Atherosclerotic calcification. No acute vascular finding. Reproductive: Negative Other: Hemoperitoneum seen throughout the gutters, interloop spaces, pelvis, and bilateral of shallow inguinal hernias. The clot has become more hazy and ill-defined consistent with liquefaction. Collections in the gutters have increased in volume which may be from liquefaction or superimposed progressive infection. A drain has been placed along the superficial liver. Although in close continuity a left ventral perihepatic collection is still large at 12 x 3 cm. fluid around the right liver is significantly improved. Small volume fluid in the hepatic renal fossa. Trace pneumoperitoneum not unexpected after recent surgeries. Musculoskeletal: No acute finding. Severe lumbar disc and facet degeneration Open midline wound. IMPRESSION: 1. History of hemoperitoneum and peritoneal abscess. Increasing pelvic and gutter fluid may be related to hematoma liquefaction or progressive infection. Areas of high-density have decreased from  prior, no indication of interval hemorrhage. 2. Progressive perisplenic collection/abscess, 11 x 6 cm. 3. Improved perihepatic collection after drain placement, although there is still sizable collection along the ventral left liver at 12 x 3 cm. The tip of the catheter is central to this persistent collection. 4. Postoperative bowel without ileus or obstruction. 5. Lower lobe atelectasis and trace effusions. No consolidating pneumonia. 6. Open laparotomy wound.  No undermining or collection. Electronically Signed   By: Marnee Spring M.D.   On: 08/07/2017 15:57   Ct Chest Wo Contrast  Result Date: 08/07/2017 CLINICAL DATA:  Abdominal distention. History perforated and ischemic cecum. Exploratory laparotomy for abdominal abscess and pelvic hematoma. EXAM: CT CHEST, ABDOMEN AND PELVIS WITHOUT CONTRAST TECHNIQUE: Multidetector CT imaging of the chest, abdomen and pelvis was performed following the standard protocol without IV contrast. COMPARISON:  Abdomen and pelvis CT from 8 days ago FINDINGS: CT CHEST FINDINGS Cardiovascular: Normal heart size. No pericardial effusion. PICC on the right with tip at the SVC. Mediastinum/Nodes: Negative for adenopathy. Lungs/Pleura: Multi segment lower lobe atelectasis and trace pleural effusions. Musculoskeletal: No acute or aggressive finding. CT ABDOMEN PELVIS FINDINGS Hepatobiliary: Collection along  the left lobe described below. No primary hepatic finding. High-density gallbladder in this patient is NPO per chart. Pancreas: Negative Spleen: Subcapsular collection extending left subdiaphragmatic, 11 x 6 cm compared to 8 x 5 cm previously. Adrenals/Urinary Tract: Right renal cystic density. No hydronephrosis. High-density lesion lower left kidney compatible with proteinaceous cyst. Thick appearance of the bladder dome which may be from underdistention. No surrounding inflammation. Recent catheterization with small volume pneumaturia. Stomach/Bowel: Recent perforated cecum  with small bowel and cecal resection. 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 No obstruction or ileus changes. Previously administered oral contrast seen in the excluded colon. Vascular/Lymphatic: Atherosclerotic calcification. No acute vascular finding. Reproductive: Negative Other: Hemoperitoneum seen throughout the gutters, interloop spaces, pelvis, and bilateral of shallow inguinal hernias. The clot has become more hazy and ill-defined consistent with liquefaction. Collections in the gutters have increased in volume which may be from liquefaction or superimposed progressive infection. A drain has been placed along the superficial liver. Although in close continuity a left ventral perihepatic collection is still large at 12 x 3 cm. fluid around the right liver is significantly improved. Small volume fluid in the hepatic renal fossa. Trace pneumoperitoneum not unexpected after recent surgeries. Musculoskeletal: No acute finding. Severe lumbar disc and facet degeneration Open midline wound. IMPRESSION: 1. History of hemoperitoneum and peritoneal abscess. Increasing pelvic and gutter fluid may be related to hematoma liquefaction or progressive infection. Areas of high-density have decreased from prior, no indication of interval hemorrhage. 2. Progressive perisplenic collection/abscess, 11 x 6 cm. 3. Improved perihepatic collection after drain placement, although there is still sizable collection along the ventral left liver at 12 x 3 cm. The tip of the catheter is central to this persistent collection. 4. Postoperative bowel without ileus or obstruction. 5. Lower lobe atelectasis and trace effusions. No consolidating pneumonia. 6. Open laparotomy wound.  No undermining or collection. Electronically Signed   By: Marnee SpringJonathon  Watts M.D.   On: 08/07/2017 15:57   Dg Chest Port 1 View  Result Date: 08/09/2017 CLINICAL DATA:  Respiratory failure. EXAM: PORTABLE CHEST 1 VIEW COMPARISON:  Radiograph  of August 08, 2017. FINDINGS: Stable cardiomediastinal silhouette. Endotracheal tube is in grossly good position. Right-sided PICC line is unchanged in position. No pneumothorax is noted. Mild bibasilar subsegmental atelectasis is noted with probable minimal pleural effusions. Bony thorax is unremarkable. IMPRESSION: Endotracheal tube in grossly good position. Stable bibasilar subsegmental atelectasis with minimal pleural effusions. Electronically Signed   By: Lupita RaiderJames  Green Jr, M.D.   On: 08/09/2017 08:50   Dg Chest Port 1 View  Result Date: 08/08/2017 CLINICAL DATA:  Post intubation. EXAM: PORTABLE CHEST 1 VIEW COMPARISON:  CT yesterday FINDINGS: Endotracheal tube 3.8 cm from the carina. Right upper extremity PICC tip in the distal SVC. Low lung volumes. Mild cardiomegaly. Small bilateral pleural effusions and associated atelectasis. No pulmonary edema or pneumothorax. IMPRESSION: 1. Endotracheal tube 3.8 cm from the carina. Right upper extremity PICC in place. 2. Low lung volumes with bibasilar atelectasis and small pleural effusions. Electronically Signed   By: Rubye OaksMelanie  Ehinger M.D.   On: 08/08/2017 03:28   Ct Image Guided Drainage By Percutaneous Catheter  Result Date: 08/08/2017 INDICATION: History of small bowel perforation, cecal ischemia and hemoperitoneum with multiple recent intra-abdominal surgery is including creation of an ostomy in wound VAC placement. Patient remains critically ill now with persistent fluid collection within the left upper abdominal quadrant. Please perform percutaneous drainage catheter placement for infection source control purposes. EXAM: CT IMAGE GUIDED  DRAINAGE BY PERCUTANEOUS CATHETER COMPARISON:  CT chest, abdomen and pelvis - 08/07/2017 MEDICATIONS: The patient is currently admitted to the hospital and receiving intravenous antibiotics. The antibiotics were administered within an appropriate time frame prior to the initiation of the procedure. ANESTHESIA/SEDATION:  Moderate (conscious) sedation was employed during this procedure. A total of Versed 1 mg and Fentanyl 50 mcg was administered intravenously. Moderate Sedation Time: 14 minutes. The patient's level of consciousness and vital signs were monitored continuously by radiology nursing throughout the procedure under my direct supervision. CONTRAST:  None COMPLICATIONS: None immediate. PROCEDURE: Informed written consent was obtained from the patient's family after a discussion of the risks, benefits and alternatives to treatment. The patient was placed prone on the CT gantry and a pre procedural CT was performed re-demonstrating the known abscess/fluid collection within the left upper abdominal quadrant adjacent to the spleen with dominant lenticular component measuring approximately 12.0 x 4.1 cm (image 20, series 2). The procedure was planned. A timeout was performed prior to the initiation of the procedure. The skin overlying the left upper abdomen was prepped and draped in the usual sterile fashion. The overlying soft tissues were anesthetized with 1% lidocaine with epinephrine. Appropriate trajectory was planned with the use of a 22 gauge spinal needle. An 18 gauge trocar needle was advanced into the abscess/fluid collection and a short Amplatz super stiff wire was coiled within the collection. Appropriate positioning was confirmed with a limited CT scan. The tract was serially dilated allowing placement of a 10 [place] all-purpose drainage catheter. Appropriate positioning was confirmed with a limited postprocedural CT scan. Approximately 50 cc of purulent fluid was aspirated. The tube was connected to a JP bulb and sutured in place. A dressing was placed. The patient tolerated the procedure well without immediate post procedural complication. IMPRESSION: Successful CT guided placement of a 10 French all purpose drain catheter into the left upper abdominal with aspiration of 50 cc of purulent fluid. Samples were sent  to the laboratory as requested by the ordering clinical team. Electronically Signed   By: Simonne Come M.D.   On: 08/08/2017 18:18     CULTURES Blood 12/04: E coli Blood 12/10: negative Surgical wound 12/12:  Pan sensitive E. Coli Surgical drain 12/20>> GS>> FEW GRAM POSITIVE COCCI IN PAIRS IN CLUSTERS  FEW GRAM NEGATIVE RODS>> CX:   ANTIBIOTICS Vancomycin 12/04 >> 12/04 Zosyn 12/04 >> 12/13 Anidulafungin 12/04 >> 12/17 Meropenem 12/14 >> 12/17 Zosyn 12/17 >>  Anidulafungin 12/19 >>  Vanco 12/19 >>   EVENTS 12/04 Admit 12/06 Ileostomy, closure of abdominal wound 12/14 Laparotomy for abscess, evacuation of hematoma  12/16 Off precedex  12/18 Bicarb added for NG acidosis, tx 1 unit PRBC 12/19 Decompensated, PEA arrest > intubated  LINES/TUBES Lt [place] CVL 12/04 >> 12/16 RUE PICC 12/16 >>   ASSESSMENT / PLAN:  GI A: Peritonitis from perforated small bowel and ischemic cecum s/p SB ileocecectomy, ileostomy. Abdominal abscess s/p re-exploration 12/14. E coli bacteremia. R drain placed 12/20 with purulent drainage Per [facility]CS>> Not surgical candidate  P: Post-operative care, TPN / nutrition per CCS Continue zosyn, anidulafungin, vanco  Appreciate IR assistance  Continue TNA/ lipids Ongoing assessment for worsening distension  NEURO A: Agitated delirium with hx of ETOH>> improved today Deconditioning of Critical Illness  P: Fentanyl gtt sedation / pain  PT efforts  Frequent re-orientation  RESPIRATORY A: Acute Respiratory Insufficiency - in setting of PEA arrest, abdominal abscess CXR 12/21>> Stable bibasilar subsegmental atelectasis with minimal pleural effusions  P: PRVC 8 cc/kg  Wean PEEP / FiO2 for sats > 90% Follow CXR  CPAP/PS trial as tolerated  CARDIAC  A: A fib with RVR Hypertension Net negative since admission P: Tele monitoring  Trend troponin  Hold lopressor, hydralazine  PRN labetalol for SBP > 170  RENAL A: Acute Renal Failure 2nd to  ATN. Non Anion Gap Acidosis - gap corrected for albumin 9, likely related to ileostomy Hypernatremia.>> improving Hypokalemia.>> repleted 12/21 P: D5W with 3 amps bicarb @ 50 ml/hr>> ABG now  Discontinue HCO3 per ABG results Trend BMP / urinary output Replace electrolytes as indicated Avoid nephrotoxic agents, ensure adequate renal perfusion  HEMATOLOGY  A: Anemia - suspect related critical illness / phlebotomy. HGB 7.1 No obvious source of bleeding P: Trend CBC Transfuse for HGB < 7 Monitor for any obvious source of bleeding   ENDOCRINE A: Hyperglycemia. P: SSI   Infection A: T max 102 Leukocytosis Purulent drainage per JP's P: Trend CBC and Fever curve Follow Micro and narrow ABX as able  Cultures as is clinically indicated   DVT prophylaxis: SQ heparin SUP: Protonix Diet: NPO, TPN Goals of care: Full code  Family - Sister updated 12/20.  She reports the patient is not married and does have grown daughters. They reportedly do not have a very close relationship (maybe talking once every three months).  The daughters do live in town.  [place].  He does not have an HCPOA.  She is attempting to find his daughters to notify them of his illness.  We discussed the nature of his critical illness and the plan going forward.  She understands that he is critically ill and that this will be a difficult course if he survives.  She is in agreement to continue aggressive care for now but understands that if his trend is progressive decline despite aggressive measures that we may need to reconsider our approach.    08/09/2017>> No family at bedside  Bevelyn NgoSarah F. Groce, AGACNP-BC [facility] Pgr: [phone] 08/09/2017, 9:54 AM

## 2017-08-09 NOTE — Progress Notes (Signed)
PHARMACY - ADULT TOTAL PARENTERAL NUTRITION CONSULT NOTE   Pharmacy Consult for TPN Indication: prolonged ileus  Patient Measurements: Body mass index is 24.89 kg/m. Filed Weights   08/07/17 0221 08/08/17 0400 08/09/17 0500  Weight: 168 lb 6.9 oz (76.4 kg) 173 lb 8 oz (78.7 kg) 173 lb 8 oz (78.7 kg)    HPI: 54 YOM admitted on 12/4 with c/o severe and worsening abdominal distention, pain, nonbloody, nonbilious, non-coffee-ground emesis and lack of bowel movements x3 days.  No significant PMH.  He was found to have perforated small bowel s/p repair in OR, and remains intubated/sedated on pressors postop.  Pharmacy is consulted to dose TPN.  Significant events:  12/4 SB resection, open wound VAC.  NG tube placed 12/5 Started CRRT 12/6 Repeat OR on for washout, ileocecectomy, creation of end ileostomy,closure of abdominal wall 12/9 CRRT off 12/9 am 12/12 Exploratory laparotomy, drainage of abdominal abscess and evacuation of pelvic hematoma, RUQ drain, and left lateral drain placement. Findings per surgery note: Subdiaphragmatic and subhepatic abscesses.Large organizing pelvic hematoma. Apparent necrotic tissue left lobe of the liver. 12/14 Extubated 12/17 Some drainage through his ileostomy, but no bowel sounds.  No diet, may have ice chips, sips of clears. 12/18 Transfuse PRBC, start bicarb, cont sips of clears only 12/20 Pt unresponsive, code blue, now re-intubated.  TPN was stopped during code.  Additional drain placed by IR.  Insulin requirements past 24 hours: 4 units SSI/24h  Current Nutrition: NPO.  TPN at goal rate  IVF: NaBicarb 150 mEq/L in D5W at 50 ml/hr , D5W at 76m/hr  Central access: CVC triple lumen placed 12/4 TPN start date: 12/7  ASSESSMENT                                                                                                          Today:   Glucose:  goal < 150 (no hx of DM), CBGs controlled and somewhat decreased from previous despite D5 and  NaBicarb/D5 infusion.  Minimal SSI use.  Range 95-127.  Electrolytes: Na elevated but improved at 149, despite NaBicarb IVF.  Cl elevated but improving, K low (despite replacement).  CorrCa 9.02 WNL.  Phos and Mag elevated 12/20; CaPhos 48 (goal <55).    Electrolytes removed from TPN on 12/17.  Renal:  SCr increased further to 2.7 (off CRRT 12/9a) s/p resuscitation and addition of Vanc/Zosyn/Eraxis, and pressors.    I/O = +3000 mL/24h; drain output 230 mL (3 drains), stool output 550 mL in ostomy; UOP 1175 mL   Weight decreased to less than admit weight, but now increasing.  Watch volume status.  Admission wt 79.4 kg  LFTs:  AST, ALT, Alk Phos wnl, Tbili elevated but improved, not jaundiced (12/20)  TGs: 233 (12/5), 400 (12/6), 281 (12/7 - propofol stopped), 322 (12/10), 231 (12/14), 216 (12/17)  Prealbumin:  9.3 (12/5), 9 (12/10), 18.8 (12/17)  NUTRITIONAL GOALS  RD recs (updated 12/20): 110-126g Protein, 2161 Kcal  Based on published guidelines, while the patient meets ICU status the initiation of lipids is being delayed for 7 days or until transition out of ICU. Planned start date for lipids is 12/14.  Goal will be to meet 100% of the patient's protein needs and approximately 80% of their caloric needs.  Clinimix 5/15 @ 100 mL/hr and lipids 20% at 39m/hr over 12h to provide  120 grams protein (100% of goal) and 2064 kcal (95% of goal).  PLAN                                                                                                                         Now:   KCl 10 mEq IV x6 runs today  At 1800 today:  Continue Clinimix 5/15 NO electrolyte formula at 100 ml/hr   Started lipids 12/14 (held for first week of ICU status) at 168mhr over 12h (dose reduction due to mildly elevated trigs)  TPN to contain standard multivitamins and trace element daily.  IVF per MD, dicussed with PCCM -  NaBicarb/D5 at 50 ml/hr + D5W at 5086mr  Continue CBGs q4h checks and SSI moderate scale.   TPN lab panels on Mondays & Thursdays.  Recheck AM labs.  ChrGretta ArabarmD, BCPS Pager [phone]/21/2018 7:09 AM

## 2017-08-10 ENCOUNTER — Inpatient Hospital Stay (HOSPITAL_COMMUNITY): Payer: Non-veteran care

## 2017-08-10 LAB — CBC
HEMATOCRIT: 17.6 % — AB (ref 39.0–52.0)
HEMATOCRIT: 17.4 % — AB (ref 39.0–52.0)
HEMATOCRIT: 26.5 % — AB (ref 39.0–52.0)
HEMOGLOBIN: 5.6 g/dL — AB (ref 13.0–17.0)
HEMOGLOBIN: 8.5 g/dL — AB (ref 13.0–17.0)
Hemoglobin: 5.6 g/dL — CL (ref 13.0–17.0)
MCH: 28.9 pg (ref 26.0–34.0)
MCH: 29.2 pg (ref 26.0–34.0)
MCH: 29.4 pg (ref 26.0–34.0)
MCHC: 31.8 g/dL (ref 30.0–36.0)
MCHC: 32.2 g/dL (ref 30.0–36.0)
MCHC: 32.1 g/dL (ref 30.0–36.0)
MCV: 90.7 fL (ref 78.0–100.0)
MCV: 90.6 fL (ref 78.0–100.0)
MCV: 91.7 fL (ref 78.0–100.0)
Platelets: 388 10*3/uL (ref 150–400)
Platelets: 392 10*3/uL (ref 150–400)
Platelets: 385 10*3/uL (ref 150–400)
RBC: 1.94 MIL/uL — AB (ref 4.22–5.81)
RBC: 1.92 MIL/uL — ABNORMAL LOW (ref 4.22–5.81)
RBC: 2.89 MIL/uL — ABNORMAL LOW (ref 4.22–5.81)
RDW: 15.8 % — ABNORMAL HIGH (ref 11.5–15.5)
RDW: 15.8 % — AB (ref 11.5–15.5)
RDW: 14.8 % (ref 11.5–15.5)
WBC: 10.2 10*3/uL (ref 4.0–10.5)
WBC: 10.4 10*3/uL (ref 4.0–10.5)
WBC: 12.2 10*3/uL — AB (ref 4.0–10.5)

## 2017-08-10 LAB — GLUCOSE, CAPILLARY
GLUCOSE-CAPILLARY: 105 mg/dL — AB (ref 65–99)
GLUCOSE-CAPILLARY: 133 mg/dL — AB (ref 65–99)
GLUCOSE-CAPILLARY: 95 mg/dL (ref 65–99)
Glucose-Capillary: 110 mg/dL — ABNORMAL HIGH (ref 65–99)
Glucose-Capillary: 112 mg/dL — ABNORMAL HIGH (ref 65–99)
Glucose-Capillary: 124 mg/dL — ABNORMAL HIGH (ref 65–99)
Glucose-Capillary: 112 mg/dL — ABNORMAL HIGH (ref 65–99)

## 2017-08-10 LAB — COMPREHENSIVE METABOLIC PANEL
ALK PHOS: 81 U/L (ref 38–126)
ALT: 26 U/L (ref 17–63)
AST: 33 U/L (ref 15–41)
Albumin: 1.6 g/dL — ABNORMAL LOW (ref 3.5–5.0)
Anion gap: 5 (ref 5–15)
BILIRUBIN TOTAL: 2 mg/dL — AB (ref 0.3–1.2)
BUN: 70 mg/dL — AB (ref 6–20)
CO2: 23 mmol/L (ref 22–32)
CREATININE: 2.53 mg/dL — AB (ref 0.61–1.24)
Calcium: 7.6 mg/dL — ABNORMAL LOW (ref 8.9–10.3)
Chloride: 118 mmol/L — ABNORMAL HIGH (ref 101–111)
GFR calc Af Amer: 29 mL/min — ABNORMAL LOW (ref >60)
GFR, EST NON AFRICAN AMERICAN: 25 mL/min — AB (ref >60)
Glucose, Bld: 124 mg/dL — ABNORMAL HIGH (ref 65–99)
Potassium: 3.2 mmol/L — ABNORMAL LOW (ref 3.5–5.1)
Sodium: 146 mmol/L — ABNORMAL HIGH (ref 135–145)
TOTAL PROTEIN: 6.6 g/dL (ref 6.5–8.1)

## 2017-08-10 LAB — TYPE AND SCREEN
ABO/RH(D): O POS
Antibody Screen: NEGATIVE
UNIT DIVISION: 0
UNIT DIVISION: 0

## 2017-08-10 LAB — BPAM RBC
Blood Product Expiration Date: 201901112359
Blood Product Expiration Date: 201901142359
ISSUE DATE / TIME: 201812180949
UNIT TYPE AND RH: 5100
UNIT TYPE AND RH: 5100

## 2017-08-10 LAB — PREPARE RBC (CROSSMATCH)

## 2017-08-10 LAB — MAGNESIUM: MAGNESIUM: 2.1 mg/dL (ref 1.7–2.4)

## 2017-08-10 LAB — PHOSPHORUS: Phosphorus: 3 mg/dL (ref 2.5–4.6)

## 2017-08-10 MED ORDER — POTASSIUM CHLORIDE 10 MEQ/100ML IV SOLN
INTRAVENOUS | Status: AC
  Filled 2017-08-10: qty 100

## 2017-08-10 MED ORDER — SODIUM CHLORIDE 0.9 % IV SOLN
Freq: Once | INTRAVENOUS | Status: AC
  Administered 2017-08-10: 10:00:00 via INTRAVENOUS

## 2017-08-10 MED ORDER — POTASSIUM CHLORIDE 10 MEQ/50ML IV SOLN
10.0000 meq | INTRAVENOUS | Status: AC
  Administered 2017-08-10 (×6): 10 meq via INTRAVENOUS
  Filled 2017-08-10 (×7): qty 50

## 2017-08-10 MED ORDER — TRACE MINERALS CR-CU-MN-SE-ZN 10-1000-500-60 MCG/ML IV SOLN
INTRAVENOUS | Status: AC
  Administered 2017-08-10: 17:00:00 via INTRAVENOUS
  Filled 2017-08-10: qty 2400
  Filled 2017-08-10: qty 2000

## 2017-08-10 MED ORDER — FAT EMULSION 20 % IV EMUL
180.0000 mL | INTRAVENOUS | Status: AC
  Administered 2017-08-10: 180 mL via INTRAVENOUS
  Filled 2017-08-10: qty 250

## 2017-08-10 NOTE — Progress Notes (Signed)
CRITICAL VALUE ALERT  Critical Value:  Hgb 5.6  Date & Time Notied:  08/10/2017 0745  Provider Notified: Delton CoombesByrum MD  Orders Received/Actions taken: 2 Units of PRBC ordered to transfuse

## 2017-08-10 NOTE — Progress Notes (Signed)
PHARMACY - ADULT TOTAL PARENTERAL NUTRITION CONSULT NOTE   Pharmacy Consult for TPN Indication: prolonged ileus  Patient Measurements: Body mass index is 25.46 kg/m. Filed Weights   08/09/17 0500 08/09/17 0917 08/10/17 0410  Weight: 173 lb 8 oz (78.7 kg) 173 lb 8 oz (78.7 kg) 177 lb 7.5 oz (80.5 kg)    HPI: 14 YOM admitted on 12/4 with c/o severe and worsening abdominal distention, pain, nonbloody, nonbilious, non-coffee-ground emesis and lack of bowel movements x3 days.  No significant PMH.  He was found to have perforated small bowel s/p repair in OR, and remains intubated/sedated on pressors postop.  Pharmacy is consulted to dose TPN.  Significant events:  12/4 SB resection, open wound VAC.  NG tube placed 12/5 Started CRRT 12/6 Repeat OR on for washout, ileocecectomy, creation of end ileostomy,closure of abdominal wall 12/9 CRRT off 12/9 am 12/12 Exploratory laparotomy, drainage of abdominal abscess and evacuation of pelvic hematoma, RUQ drain, and left lateral drain placement. Findings per surgery note: Subdiaphragmatic and subhepatic abscesses.Large organizing pelvic hematoma. Apparent necrotic tissue left lobe of the liver. 12/14 Extubated 12/17 Some drainage through his ileostomy, but no bowel sounds.  No diet, may have ice chips, sips of clears. 12/18 Transfuse PRBC, start bicarb, cont sips of clears only 12/20 Pt unresponsive, code blue, now re-intubated.  TPN was stopped during code.  Additional drain placed by IR. 12/22 Transfuse PRBC for anemia.  Insulin requirements past 24 hours: 3 units SSI/24h  Current Nutrition: NPO.  TPN at goal rate  IVF: D5W at 54m/hr  Central access: CVC triple lumen placed 12/4 TPN start date: 12/7  ASSESSMENT                                                                                                          Today:   Glucose:  goal < 150 (no hx of DM), CBGs controlled and trending down in range from previous despite additional  D5 infusion and decreasing SSI use.  Range 96-127.  Electrolytes: Na elevated but improved at 146 (now off NaBicarb). Cl elevated but improving, K low.  CorrCa, Phos, Mag all WNL.    Electrolytes removed from TPN on 12/17.  NaBicarb infusion started 12/18, stopped 12/21  Renal:  SCr improved to 2.5, (off CRRT 12/9a) s/p resuscitation and addition of Vanc/Zosyn/Eraxis, and pressors.    I/O = +800 mL/24h; drain output 285 mL (3 drains), stool output 475 mL in ostomy; UOP increased to 2850 mL   Weight decreased to less than admit weight, but now increasing.  Watch volume status.  Admission wt 79.4 kg  LFTs:  AST, ALT, Alk Phos wnl, Tbili elevated but improved, not jaundiced (12/20)  TGs: 233 (12/5), 400 (12/6), 281 (12/7 - propofol stopped), 322 (12/10), 231 (12/14), 216 (12/17)  Prealbumin:  9.3 (12/5), 9 (12/10), 18.8 (12/17)  NUTRITIONAL GOALS  RD recs (updated 12/21): 110-126g Protein, 2269 Kcal  Based on published guidelines, while the patient meets ICU status the initiation of lipids is being delayed for 7 days or until transition out of ICU. Planned start date for lipids is 12/14.  Goal will be to meet 100% of the patient's protein needs and approximately 80% of their caloric needs.  Clinimix 5/15 @ 100 mL/hr and lipids 20% at 22m/hr over 12h to provide  120 grams protein (100% of goal) and 2064 kcal (91% of goal).  PLAN                                                                                                                         Now:   KCl 10 mEq IV x6 runs per MD  At 1800 today:  Continue Clinimix 5/15 NO electrolyte formula at 100 ml/hr   Started lipids 12/14 (held for first week of ICU status) at 175mhr over 12h (dose reduction due to mildly elevated trigs)  TPN to contain standard multivitamins and trace element daily.  IVF per MD, dicussed with PCCM -  D5W at  5060mr  Continue CBGs q4h checks and SSI moderate scale.   TPN lab panels on Mondays & Thursdays.  Recheck AM labs.  ChrGretta ArabarmD, BCPS Pager [phone]/22/2018 8:53 AM

## 2017-08-10 NOTE — Progress Notes (Signed)
Chief Complaint: Patient was seen today for LUQ abscess drain   Supervising Physician: Markus Daft  Patient Status: [facility] - In-pt  Subjective: Follow up LUQ abscess drain Pt remains intubated but awake and following commands   Objective: Physical Exam: BP (!) 147/64   Pulse 81   Temp (!) 100.4 F (38 C) (Core (Comment)) Comment (Src): Foley  Resp (!) 22   Ht '5\' 10"'$  (1.778 m)   Wt 177 lb 7.5 oz (80.5 kg)   SpO2 100%   BMI 25.46 kg/m  LUQ drain intact, site clean, NT Output remains purulent, bulb almost full   Current Facility-Administered Medications:  .  acetaminophen (TYLENOL) suppository 650 mg, 650 mg, Rectal, Q6H PRN, Anders Simmonds, MD, 650 mg at 08/09/17 2114 .  anidulafungin (ERAXIS) 100 mg in sodium chloride 0.9 % 100 mL IVPB, 100 mg, Intravenous, Q24H, Brand Males, MD, Stopped at 08/09/17 2254 .  chlorhexidine gluconate (MEDLINE KIT) (PERIDEX) 0.12 % solution 15 mL, 15 mL, Mouth Rinse, BID, Ashok Cordia, Sonia Baller, MD, 15 mL at 08/10/17 0800 .  Chlorhexidine Gluconate Cloth 2 % PADS 6 each, 6 each, Topical, Daily, Brand Males, MD, 6 each at 08/10/17 1029 .  dextrose 5 % solution, , Intravenous, Continuous, Byrum, Rose Fillers, MD, Last Rate: 50 mL/hr at 08/10/17 0400 .  TPN (CLINIMIX) Adult without lytes, , Intravenous, Continuous TPN **AND** fat emulsion 20 % infusion 180 mL, 180 mL, Intravenous, Continuous TPN, Shade, Haze Justin, RPH .  fentaNYL (SUBLIMAZE) 2,500 mcg in sodium chloride 0.9 % 250 mL (10 mcg/mL) infusion, 25-400 mcg/hr, Intravenous, Continuous, Ramachandran, Pradeep, MD, Last Rate: 30 mL/hr at 08/10/17 1255, 300 mcg/hr at 08/10/17 1255 .  fentaNYL (SUBLIMAZE) bolus via infusion 50 mcg, 50 mcg, Intravenous, Q1H PRN, Laverle Hobby, MD, 50 mcg at 08/09/17 2200 .  fentaNYL (SUBLIMAZE) injection 50 mcg, 50 mcg, Intravenous, Q1H PRN, Laverle Hobby, MD, 50 mcg at 08/09/17 1410 .  fentaNYL (SUBLIMAZE) injection, , Intravenous, PRN,  Sandi Mariscal, MD, 50 mcg at 08/08/17 1740 .  folic acid injection 1 mg, 1 mg, Intravenous, Daily, Salvadore Dom E, NP, 1 mg at 08/10/17 0856 .  heparin injection 5,000 Units, 5,000 Units, Subcutaneous, Q8H, Brand Males, MD, 5,000 Units at 08/10/17 0853 .  insulin aspart (novoLOG) injection 0-15 Units, 0-15 Units, Subcutaneous, Q4H, Dara Hoyer, RPH, 2 Units at 08/10/17 0800 .  iopamidol (ISOVUE-300) 61 % injection 15 mL, 15 mL, Oral, BID PRN, Brand Males, MD .  labetalol (NORMODYNE,TRANDATE) injection 5 mg, 5 mg, Intravenous, Q4H PRN, Chesley Mires, MD, 5 mg at 08/05/17 1102 .  MEDLINE mouth rinse, 15 mL, Mouth Rinse, QID, Ashok Cordia, Sonia Baller, MD, 15 mL at 08/10/17 1155 .  midazolam (VERSED) injection 2 mg, 2 mg, Intravenous, Q2H PRN, Laverle Hobby, MD, 2 mg at 08/09/17 0307 .  midazolam (VERSED) injection, , Intravenous, PRN, Sandi Mariscal, MD, 1 mg at 08/08/17 1739 .  norepinephrine (LEVOPHED) 4 mg in dextrose 5 % 250 mL (0.016 mg/mL) infusion, 0-40 mcg/min, Intravenous, Titrated, Charlesetta Garibaldi, MD, Stopped at 08/08/17 0915 .  pantoprazole (PROTONIX) injection 40 mg, 40 mg, Intravenous, Q24H, Sood, Vineet, MD, 40 mg at 08/09/17 2114 .  piperacillin-tazobactam (ZOSYN) IVPB 3.375 g, 3.375 g, Intravenous, Q8H, Shade, Christine E, RPH, Last Rate: 12.5 mL/hr at 08/10/17 0856, 3.375 g at 08/10/17 0856 .  potassium chloride 10 MEQ/100ML IVPB, , , ,  .  sodium chloride flush (NS) 0.9 % injection 10-40 mL, 10-40 mL, Intracatheter, Q12H, Ramaswamy, Murali,  MD, 10 mL at 08/10/17 1030 .  sodium chloride flush (NS) 0.9 % injection 10-40 mL, 10-40 mL, Intracatheter, PRN, Brand Males, MD, 10 mL at 08/09/17 1711 .  sodium chloride flush (NS) 0.9 % injection 5 mL, 5 mL, Intravenous, Q8H, Sandi Mariscal, MD, 5 mL at 08/10/17 1030 .  thiamine (B-1) injection 100 mg, 100 mg, Intravenous, Daily, Erick Colace, NP, 100 mg at 08/10/17 0854 .  TPN (CLINIMIX) Adult without lytes, ,  Intravenous, Continuous TPN, Last Rate: 100 mL/hr at 08/10/17 0400 **AND** [EXPIRED] fat emulsion 20 % infusion 180 mL, 180 mL, Intravenous, Continuous TPN, Shade, Haze Justin, RPH, Stopped at 08/10/17 0509 .  vancomycin (VANCOCIN) 1,500 mg in sodium chloride 0.9 % 500 mL IVPB, 1,500 mg, Intravenous, Q48H, Thomes Lolling, [facility], Stopped at 08/10/17 0815 .  vasopressin (PITRESSIN) 40 Units in sodium chloride 0.9 % 250 mL (0.16 Units/mL) infusion, 0.03 Units/min, Intravenous, Continuous, Charlesetta Garibaldi, MD, Stopped at 08/08/17 1024  Labs: CBC Recent Labs    08/10/17 0415 08/10/17 0516  WBC 10.2 10.4  HGB 5.6* 5.6*  HCT 17.6* 17.4*  PLT 388 392   BMET Recent Labs    08/09/17 0500 08/10/17 0415  NA 149* 146*  K 3.0* 3.2*  CL 119* 118*  CO2 25 23  GLUCOSE 140* 124*  BUN 78* 70*  CREATININE 2.70* 2.53*  CALCIUM 7.5* 7.6*   LFT Recent Labs    08/10/17 0415  PROT 6.6  ALBUMIN 1.6*  AST 33  ALT 26  ALKPHOS 81  BILITOT 2.0*   PT/INR Recent Labs    08/08/17 1329  LABPROT 17.5*  INR 1.45     Studies/Results: Dg Chest Port 1 View  Result Date: 08/10/2017 CLINICAL DATA:  Respiratory failure. Former smoker. Follow-up exam. EXAM: PORTABLE CHEST 1 VIEW COMPARISON:  08/09/2017 FINDINGS: There is persistent lung base opacity consistent with atelectasis. Remainder of the lungs is clear. Endotracheal tube and right PICC are stable in well positioned. Suspect small pleural effusions.  No pneumothorax. IMPRESSION: 1. No change from the previous day's study. 2. Persistent basilar atelectasis. 3. Support apparatus is stable and well positioned. Electronically Signed   By: Lajean Manes M.D.   On: 08/10/2017 07:04   Dg Chest Port 1 View  Result Date: 08/09/2017 CLINICAL DATA:  Respiratory failure. EXAM: PORTABLE CHEST 1 VIEW COMPARISON:  Radiograph of August 08, 2017. FINDINGS: Stable cardiomediastinal silhouette. Endotracheal tube is in grossly good position. Right-sided  PICC line is unchanged in position. No pneumothorax is noted. Mild bibasilar subsegmental atelectasis is noted with probable minimal pleural effusions. Bony thorax is unremarkable. IMPRESSION: Endotracheal tube in grossly good position. Stable bibasilar subsegmental atelectasis with minimal pleural effusions. Electronically Signed   By: Marijo Conception, M.D.   On: 08/09/2017 08:50   Ct Image Guided Drainage By Percutaneous Catheter  Result Date: 08/08/2017 INDICATION: History of small bowel perforation, cecal ischemia and hemoperitoneum with multiple recent intra-abdominal surgery is including creation of an ostomy in wound VAC placement. Patient remains critically ill now with persistent fluid collection within the left upper abdominal quadrant. Please perform percutaneous drainage catheter placement for infection source control purposes. EXAM: CT IMAGE GUIDED DRAINAGE BY PERCUTANEOUS CATHETER COMPARISON:  CT chest, abdomen and pelvis - 08/07/2017 MEDICATIONS: The patient is currently admitted to the hospital and receiving intravenous antibiotics. The antibiotics were administered within an appropriate time frame prior to the initiation of the procedure. ANESTHESIA/SEDATION: Moderate (conscious) sedation was employed during this procedure. A total of  Versed 1 mg and Fentanyl 50 mcg was administered intravenously. Moderate Sedation Time: 14 minutes. The patient's level of consciousness and vital signs were monitored continuously by radiology nursing throughout the procedure under my direct supervision. CONTRAST:  None COMPLICATIONS: None immediate. PROCEDURE: Informed written consent was obtained from the patient's family after a discussion of the risks, benefits and alternatives to treatment. The patient was placed prone on the CT gantry and a pre procedural CT was performed re-demonstrating the known abscess/fluid collection within the left upper abdominal quadrant adjacent to the spleen with dominant  lenticular component measuring approximately 12.0 x 4.1 cm (image 20, series 2). The procedure was planned. A timeout was performed prior to the initiation of the procedure. The skin overlying the left upper abdomen was prepped and draped in the usual sterile fashion. The overlying soft tissues were anesthetized with 1% lidocaine with epinephrine. Appropriate trajectory was planned with the use of a 22 gauge spinal needle. An 18 gauge trocar needle was advanced into the abscess/fluid collection and a short Amplatz super stiff wire was coiled within the collection. Appropriate positioning was confirmed with a limited CT scan. The tract was serially dilated allowing placement of a 10 [place] all-purpose drainage catheter. Appropriate positioning was confirmed with a limited postprocedural CT scan. Approximately 50 cc of purulent fluid was aspirated. The tube was connected to a JP bulb and sutured in place. A dressing was placed. The patient tolerated the procedure well without immediate post procedural complication. IMPRESSION: Successful CT guided placement of a 10 French all purpose drain catheter into the left upper abdominal with aspiration of 50 cc of purulent fluid. Samples were sent to the laboratory as requested by the ordering clinical team. Electronically Signed   By: Sandi Mariscal M.D.   On: 08/08/2017 18:18    Assessment/Plan: Hx SB perf, E coli bacteremia, mult abd abscesses/mult abd surgeries with ileocecectomy/end ileostomy; s/p LUQ abscess drain 12/20. Continued purulent output IR following      LOS: 18 days   I spent a total of 15 minutes minutes in face to face in clinical consultation, greater than 50% of which was counseling/coordinating care for LUQ abscess drain  Elizeo Rodriques PA-C 08/10/2017 1:34 PM

## 2017-08-10 NOTE — Progress Notes (Signed)
CRITICAL VALUE ALERT  Critical Value: Hgb 5.6   Date & Time Notied:  12/22 @ 0450  Provider Notified: Pola CornELINK @ [phone]  Orders Received/Actions taken: Repeat CBC

## 2017-08-10 NOTE — Progress Notes (Signed)
PULMONARY / CRITICAL CARE MEDICINE   Name: Caleb Singh MRN: [id number] DOB: 1955/11/06    ADMISSION DATE:  07/23/2017 CONSULTATION DATE: 07/23/2017   CHIEF COMPLAINT: Abdominal pain  BRIEF SUMMARY: 61 y/o male former smoker with severe abdominal pain, and found to have free air in abdomen from perforated small bowel and ischemic cecum.  S/P resection of terminal ileum for ischemia.  Required re-look exploratory laparotomy with drainage of abdominal abscesses and evacuation of pelvic hematoma.    PMHx of ETOH, PE.  SUBJECTIVE:    Note hemoglobin dropped this morning, no evidence of active bleeding.  PRBC ordered Remains mechanically ventilated  VITAL SIGNS: BP (!) 143/69   Pulse 97   Temp (!) 100.8 F (38.2 C)   Resp (!) 25   Ht 5\' 10"  (1.778 m)   Wt 80.5 kg (177 lb 7.5 oz)   SpO2 100%   BMI 25.46 kg/m   INTAKE / OUTPUT: I/O last 3 completed shifts: In: 8282.3 [I.V.:7772.3; IV Piggyback:510] Out: 4560 [Urine:3475; Drains:410; Stool:675]  PHYSICAL EXAMINATION:  General: Thin, chronically ill, on mechanical ventilation, no distress HEENT: ET tube in place, oropharynx clear Neuro: Wakes easily on fentanyl, follows commands, moves all extremities CV: Regular with some frequent PVCs PULM: Clear bilaterally, no wheezing GI: Mild distention, midline dressing in place, ostomy with liquid stool, 3 drains present with purulent material Extremities: warm/dry, trace to 1+ LE edema  Skin: No rash seen  PULMONARY Recent Labs  Lab 08/06/17 0957 08/07/17 0024 08/08/17 0133 08/08/17 0238 08/09/17 1056  PHART 7.448 7.403  --  7.424 7.478*  PCO2ART 25.2* 32.3  --  32.9 34.3  PO2ART 86.6 112*  --  304* 99.8  HCO3 17.1* 19.7* 22.6 21.2 25.1  O2SAT 96.6 98.0 77.9 99.8 97.9    CBC Recent Labs  Lab 08/08/17 0145 08/10/17 0415 08/10/17 0516  HGB 7.1* 5.6* 5.6*  HCT 21.7* 17.6* 17.4*  WBC 18.5* 10.2 10.4  PLT 625* 388 392    COAGULATION Recent Labs  Lab  08/08/17 1329  INR 1.45    CARDIAC   Recent Labs  Lab 08/07/17 2331 08/08/17 0145 08/08/17 1120  TROPONINI 0.12* 0.13* 0.17*   No results for input(s): PROBNP in the last 168 hours.   CHEMISTRY Recent Labs  Lab 08/05/17 0620 08/06/17 0510 08/07/17 0500 08/07/17 2211 08/08/17 0145 08/09/17 0500 08/10/17 0415  NA 155* 153* 154* 155* 153* 149* 146*  K 3.7 3.4* 3.5 3.6 3.6 3.0* 3.2*  CL 127* >130* 127* 128* 125* 119* 118*  CO2 20* 18* 23 23 21* 25 23  GLUCOSE 139* 145* 124* 149* 191* 140* 124*  BUN 65* 72* 71* 68* 65* 78* 70*  CREATININE 1.65* 2.08* 2.24* 2.05* 2.27* 2.70* 2.53*  CALCIUM 8.8* 8.8* 8.3* 8.4* 8.5* 7.5* 7.6*  MG 2.5* 2.5* 2.3  --  2.6*  --  2.1  PHOS 3.3 3.0 3.9  --  5.4*  --  3.0   Estimated Creatinine Clearance: 30.9 mL/min (A) (by C-G formula based on SCr of 2.53 mg/dL (H)).   LIVER Recent Labs  Lab 08/05/17 0620 08/07/17 2211 08/08/17 0145 08/08/17 1329 08/10/17 0415  AST 35 36 42*  --  33  ALT 28 31 33  --  26  ALKPHOS 112 105 111  --  81  BILITOT 2.3* 2.4* 2.0*  --  2.0*  PROT 7.5 7.3 7.7  --  6.6  ALBUMIN 2.0* 1.9* 2.1*  --  1.6*  INR  --   --   --  1.45  --      INFECTIOUS No results for input(s): LATICACIDVEN, PROCALCITON in the last 168 hours.   ENDOCRINE CBG (last 3)  Recent Labs    08/09/17 2318 08/10/17 0348 08/10/17 0739  GLUCAP 110* 112* 124*    IMAGING Dg Chest Port 1 View  Result Date: 08/10/2017 CLINICAL DATA:  Respiratory failure. Former smoker. Follow-up exam. EXAM: PORTABLE CHEST 1 VIEW COMPARISON:  08/09/2017 FINDINGS: There is persistent lung base opacity consistent with atelectasis. Remainder of the lungs is clear. Endotracheal tube and right PICC are stable in well positioned. Suspect small pleural effusions.  No pneumothorax. IMPRESSION: 1. No change from the previous day's study. 2. Persistent basilar atelectasis. 3. Support apparatus is stable and well positioned. Electronically Signed   By: Amie Portlandavid   Ormond M.D.   On: 08/10/2017 07:04   Dg Chest Port 1 View  Result Date: 08/09/2017 CLINICAL DATA:  Respiratory failure. EXAM: PORTABLE CHEST 1 VIEW COMPARISON:  Radiograph of August 08, 2017. FINDINGS: Stable cardiomediastinal silhouette. Endotracheal tube is in grossly good position. Right-sided PICC line is unchanged in position. No pneumothorax is noted. Mild bibasilar subsegmental atelectasis is noted with probable minimal pleural effusions. Bony thorax is unremarkable. IMPRESSION: Endotracheal tube in grossly good position. Stable bibasilar subsegmental atelectasis with minimal pleural effusions. Electronically Signed   By: Lupita RaiderJames  Green Jr, M.D.   On: 08/09/2017 08:50   Ct Image Guided Drainage By Percutaneous Catheter  Result Date: 08/08/2017 INDICATION: History of small bowel perforation, cecal ischemia and hemoperitoneum with multiple recent intra-abdominal surgery is including creation of an ostomy in wound VAC placement. Patient remains critically ill now with persistent fluid collection within the left upper abdominal quadrant. Please perform percutaneous drainage catheter placement for infection source control purposes. EXAM: CT IMAGE GUIDED DRAINAGE BY PERCUTANEOUS CATHETER COMPARISON:  CT chest, abdomen and pelvis - 08/07/2017 MEDICATIONS: The patient is currently admitted to the hospital and receiving intravenous antibiotics. The antibiotics were administered within an appropriate time frame prior to the initiation of the procedure. ANESTHESIA/SEDATION: Moderate (conscious) sedation was employed during this procedure. A total of Versed 1 mg and Fentanyl 50 mcg was administered intravenously. Moderate Sedation Time: 14 minutes. The patient's level of consciousness and vital signs were monitored continuously by radiology nursing throughout the procedure under my direct supervision. CONTRAST:  None COMPLICATIONS: None immediate. PROCEDURE: Informed written consent was obtained from the  patient's family after a discussion of the risks, benefits and alternatives to treatment. The patient was placed prone on the CT gantry and a pre procedural CT was performed re-demonstrating the known abscess/fluid collection within the left upper abdominal quadrant adjacent to the spleen with dominant lenticular component measuring approximately 12.0 x 4.1 cm (image 20, series 2). The procedure was planned. A timeout was performed prior to the initiation of the procedure. The skin overlying the left upper abdomen was prepped and draped in the usual sterile fashion. The overlying soft tissues were anesthetized with 1% lidocaine with epinephrine. Appropriate trajectory was planned with the use of a 22 gauge spinal needle. An 18 gauge trocar needle was advanced into the abscess/fluid collection and a short Amplatz super stiff wire was coiled within the collection. Appropriate positioning was confirmed with a limited CT scan. The tract was serially dilated allowing placement of a 10 [place] all-purpose drainage catheter. Appropriate positioning was confirmed with a limited postprocedural CT scan. Approximately 50 cc of purulent fluid was aspirated. The tube was connected to a JP bulb and sutured in place. A  dressing was placed. The patient tolerated the procedure well without immediate post procedural complication. IMPRESSION: Successful CT guided placement of a 10 French all purpose drain catheter into the left upper abdominal with aspiration of 50 cc of purulent fluid. Samples were sent to the laboratory as requested by the ordering clinical team. Electronically Signed   By: Simonne Come M.D.   On: 08/08/2017 18:18     CULTURES Blood 12/04: E coli Blood 12/10: negative Surgical wound 12/12:  Pan sensitive E. Coli Surgical drain 12/20>> GS>> FEW GRAM POSITIVE COCCI IN PAIRS IN CLUSTERS  FEW GRAM NEGATIVE RODS>> CX:   ANTIBIOTICS Vancomycin 12/04 >> 12/04 Zosyn 12/04 >> 12/13 Anidulafungin 12/04 >>  12/17 Meropenem 12/14 >> 12/17 Zosyn 12/17 >>  Anidulafungin 12/19 >>  Vanco 12/19 >>   EVENTS 12/04 Admit 12/06 Ileostomy, closure of abdominal wound 12/14 Laparotomy for abscess, evacuation of hematoma  12/16 Off precedex  12/18 Bicarb added for NG acidosis, tx 1 unit PRBC 12/19 Decompensated, PEA arrest > intubated  LINES/TUBES Lt [place] CVL 12/04 >> 12/16 RUE PICC 12/16 >>   ASSESSMENT / PLAN:  GI A: Peritonitis from perforated small bowel and ischemic cecum s/p SB ileocecectomy, ileostomy. Abdominal abscess s/p re-exploration 12/14. E coli bacteremia. R drain placed 12/20 with purulent drainage Per CCS>> Not surgical candidate  P: Post-operative care, TPN / nutrition per CCS Agree with Zosyn, anidulafungin; likely discontinue vancomycin once new drain cultures are finalized Greatly appreciate IR assistance with drain placement Continue TNA/ lipids Consider repeat CT abdomen if he continues to exhibit evidence for intra-abdominal hemorrhage  NEURO A: Agitated delirium with hx of ETOH>> improved today Deconditioning of Critical Illness  P: Fentanyl drip for pain control and sedation PT efforts  Frequent re-orientation  RESPIRATORY A: Acute Respiratory Insufficiency - in setting of PEA arrest, abdominal abscess CXR 12/21>> Stable bibasilar subsegmental atelectasis with minimal pleural effusions  P: Continue PRVC 8 cc/kg Pressure support as he is able to tolerate: He was tachypneic on PSV this morning 12/22 Wean PEEP / FiO2 for sats > 90% Follow CXR   CARDIAC  A: A fib with RVR Hypertension Net negative since admission P: Tele monitoring  Metoprolol and hydralazine are on hold PRN labetalol for SBP > 170  RENAL A: Recurrent acute Renal Failure 2nd to ATN (he required CVVH earlier in the hospitalization).  Improving 12/22 Non Anion Gap Acidosis, improving Hypernatremia.>> improving Hypokalemia.>> repleted 12/21 and 12/22 P: Off sodium  bicarbonate Continue low-dose free water for another day, D5W at 50 cc/h Trend BMP, urine output Replace electrolytes as indicated Avoid nephrotoxic agents, ensure adequate renal perfusion  HEMATOLOGY  A: Anemia -progressive on 12/22.  Question whether he may have intraperitoneal bleeding post drain placement No obvious external source of bleeding P: Follow serial CBC PRBC 2 units ordered for this morning Continue to transfuse for HGB < 7 Consider repeat CT scan of the abdomen in coming days if his hemoglobin continues to drift down  ENDOCRINE A: Hyperglycemia. P: SSI   Infection A: Peritonitis P: Intra-abdominal drain placement x3, greatly appreciate IR assistance Plan to continue Zosyn plus anidulafungin, vancomycin until culture results from the most recent peritoneal drain are available   DVT prophylaxis: SQ heparin SUP: Protonix Diet: NPO, TPN Goals of care: Full code  Family - Sister updated 12/20.  She reports the patient is not married and does have grown daughters. They reportedly do not have a very close relationship (maybe talking once every three months).  The  daughters do live in town.  He lives in the [place] housing alone.  He does not have an HCPOA.  She is attempting to find his daughters to notify them of his illness.  We discussed the nature of his critical illness and the plan going forward.  She understands that he is critically ill and that this will be a difficult course if he survives.  She is in agreement to continue aggressive care for now but understands that if his trend is progressive decline despite aggressive measures that we may need to reconsider our approach.    08/10/2017>> No family at bedside   Independent CC time 35 minutes  Levy Pupa, MD, PhD 08/10/2017, 10:08 AM [facility] [phone] or if no answer [phone]

## 2017-08-10 NOTE — Progress Notes (Signed)
10 Days Post-Op   Subjective/Chief Complaint: Awake alert on the vent, hct low this am repeated and still low   Objective: Vital signs in last 24 hours: Temp:  [100.2 F (37.9 C)-101.8 F (38.8 C)] 100.8 F (38.2 C) (12/22 0500) Pulse Rate:  [62-101] 84 (12/22 0500) Resp:  [25-27] 26 (12/22 0500) BP: (121-198)/(36-126) 121/62 (12/22 0500) SpO2:  [96 %-100 %] 100 % (12/22 0500) Arterial Line BP: (105-208)/(94-205) 208/205 (12/22 0500) FiO2 (%):  [30 %] 30 % (12/22 0325) Weight:  [78.7 kg (173 lb 8 oz)-80.5 kg (177 lb 7.5 oz)] 80.5 kg (177 lb 7.5 oz) (12/22 0410) Last BM Date: 08/09/17(ostomy)  Intake/Output from previous day: 12/21 0701 - 12/22 0700 In: 4411.5 [I.V.:4181.5; IV Piggyback:230] Out: 3610 [Urine:2850; Drains:285; Stool:475] Intake/Output this shift: No intake/output data recorded.  General awake alert follow commands on vent Lungs coarse bilateral breath sounds cv rrr abd drains with tan fluid present, open wound with necrotic base and sutures stretched, stoma pink and some functional  Lab Results:  Recent Labs    08/10/17 0415 08/10/17 0516  WBC 10.2 10.4  HGB 5.6* 5.6*  HCT 17.6* 17.4*  PLT 388 392   BMET Recent Labs    08/09/17 0500 08/10/17 0415  NA 149* 146*  K 3.0* 3.2*  CL 119* 118*  CO2 25 23  GLUCOSE 140* 124*  BUN 78* 70*  CREATININE 2.70* 2.53*  CALCIUM 7.5* 7.6*   PT/INR Recent Labs    08/08/17 1329  LABPROT 17.5*  INR 1.45   ABG Recent Labs    08/08/17 0238 08/09/17 1056  PHART 7.424 7.478*  HCO3 21.2 25.1    Studies/Results: Dg Chest Port 1 View  Result Date: 08/10/2017 CLINICAL DATA:  Respiratory failure. Former smoker. Follow-up exam. EXAM: PORTABLE CHEST 1 VIEW COMPARISON:  08/09/2017 FINDINGS: There is persistent lung base opacity consistent with atelectasis. Remainder of the lungs is clear. Endotracheal tube and right PICC are stable in well positioned. Suspect small pleural effusions.  No pneumothorax.  IMPRESSION: 1. No change from the previous day's study. 2. Persistent basilar atelectasis. 3. Support apparatus is stable and well positioned. Electronically Signed   By: Amie Portlandavid  Ormond M.D.   On: 08/10/2017 07:04   Dg Chest Port 1 View  Result Date: 08/09/2017 CLINICAL DATA:  Respiratory failure. EXAM: PORTABLE CHEST 1 VIEW COMPARISON:  Radiograph of August 08, 2017. FINDINGS: Stable cardiomediastinal silhouette. Endotracheal tube is in grossly good position. Right-sided PICC line is unchanged in position. No pneumothorax is noted. Mild bibasilar subsegmental atelectasis is noted with probable minimal pleural effusions. Bony thorax is unremarkable. IMPRESSION: Endotracheal tube in grossly good position. Stable bibasilar subsegmental atelectasis with minimal pleural effusions. Electronically Signed   By: Lupita RaiderJames  Green Jr, M.D.   On: 08/09/2017 08:50   Ct Image Guided Drainage By Percutaneous Catheter  Result Date: 08/08/2017 INDICATION: History of small bowel perforation, cecal ischemia and hemoperitoneum with multiple recent intra-abdominal surgery is including creation of an ostomy in wound VAC placement. Patient remains critically ill now with persistent fluid collection within the left upper abdominal quadrant. Please perform percutaneous drainage catheter placement for infection source control purposes. EXAM: CT IMAGE GUIDED DRAINAGE BY PERCUTANEOUS CATHETER COMPARISON:  CT chest, abdomen and pelvis - 08/07/2017 MEDICATIONS: The patient is currently admitted to the hospital and receiving intravenous antibiotics. The antibiotics were administered within an appropriate time frame prior to the initiation of the procedure. ANESTHESIA/SEDATION: Moderate (conscious) sedation was employed during this procedure. A total of Versed 1  mg and Fentanyl 50 mcg was administered intravenously. Moderate Sedation Time: 14 minutes. The patient's level of consciousness and vital signs were monitored continuously by  radiology nursing throughout the procedure under my direct supervision. CONTRAST:  None COMPLICATIONS: None immediate. PROCEDURE: Informed written consent was obtained from the patient's family after a discussion of the risks, benefits and alternatives to treatment. The patient was placed prone on the CT gantry and a pre procedural CT was performed re-demonstrating the known abscess/fluid collection within the left upper abdominal quadrant adjacent to the spleen with dominant lenticular component measuring approximately 12.0 x 4.1 cm (image 20, series 2). The procedure was planned. A timeout was performed prior to the initiation of the procedure. The skin overlying the left upper abdomen was prepped and draped in the usual sterile fashion. The overlying soft tissues were anesthetized with 1% lidocaine with epinephrine. Appropriate trajectory was planned with the use of a 22 gauge spinal needle. An 18 gauge trocar needle was advanced into the abscess/fluid collection and a short Amplatz super stiff wire was coiled within the collection. Appropriate positioning was confirmed with a limited CT scan. The tract was serially dilated allowing placement of a 10 [place] all-purpose drainage catheter. Appropriate positioning was confirmed with a limited postprocedural CT scan. Approximately 50 cc of purulent fluid was aspirated. The tube was connected to a JP bulb and sutured in place. A dressing was placed. The patient tolerated the procedure well without immediate post procedural complication. IMPRESSION: Successful CT guided placement of a 10 French all purpose drain catheter into the left upper abdominal with aspiration of 50 cc of purulent fluid. Samples were sent to the laboratory as requested by the ordering clinical team. Electronically Signed   By: Simonne Come M.D.   On: 08/08/2017 18:18    Anti-infectives: Anti-infectives (From admission, onward)   Start     Dose/Rate Route Frequency Ordered Stop   08/10/17  0500  vancomycin (VANCOCIN) 1,500 mg in sodium chloride 0.9 % 500 mL IVPB     1,500 mg 250 mL/hr over 120 Minutes Intravenous Every 48 hours 08/08/17 0728     08/08/17 2200  anidulafungin (ERAXIS) 100 mg in sodium chloride 0.9 % 100 mL IVPB     100 mg 78 mL/hr over 100 Minutes Intravenous Every 24 hours 08/08/17 0524     08/08/17 0445  vancomycin (VANCOCIN) 1,500 mg in sodium chloride 0.9 % 500 mL IVPB     1,500 mg 250 mL/hr over 120 Minutes Intravenous  Once 08/08/17 0431 08/08/17 0659   08/08/17 0430  vancomycin (VANCOCIN) 1,250 mg in sodium chloride 0.9 % 250 mL IVPB  Status:  Discontinued     1,250 mg 166.7 mL/hr over 90 Minutes Intravenous  Once 08/08/17 0418 08/08/17 0428   08/08/17 0415  anidulafungin (ERAXIS) 200 mg in sodium chloride 0.9 % 200 mL IVPB     200 mg 78 mL/hr over 200 Minutes Intravenous  Once 08/08/17 0413 08/08/17 0816   08/05/17 1000  piperacillin-tazobactam (ZOSYN) IVPB 3.375 g     3.375 g 12.5 mL/hr over 240 Minutes Intravenous Every 8 hours 08/05/17 0946     08/02/17 1400  meropenem (MERREM) 1 g in sodium chloride 0.9 % 100 mL IVPB  Status:  Discontinued     1 g 200 mL/hr over 30 Minutes Intravenous Every 12 hours 08/02/17 1111 08/05/17 0945   07/29/17 0600  piperacillin-tazobactam (ZOSYN) IVPB 3.375 g  Status:  Discontinued     3.375 g 12.5 mL/hr over  240 Minutes Intravenous Every 8 hours 07/29/17 0518 08/02/17 1036   07/25/17 2000  vancomycin (VANCOCIN) IVPB 1000 mg/200 mL premix  Status:  Discontinued     1,000 mg 200 mL/hr over 60 Minutes Intravenous Every 48 hours 07/23/17 2042 07/24/17 0956   07/24/17 2200  piperacillin-tazobactam (ZOSYN) 3.375 g in dextrose 5 % 50 mL IVPB  Status:  Discontinued     3.375 g 100 mL/hr over 30 Minutes Intravenous Every 6 hours 07/24/17 2006 07/29/17 0518   07/24/17 2100  anidulafungin (ERAXIS) 100 mg in sodium chloride 0.9 % 100 mL IVPB  Status:  Discontinued     100 mg 78 mL/hr over 100 Minutes Intravenous Every 24  hours 07/23/17 2039 08/05/17 0945   07/24/17 0000  anidulafungin (ERAXIS) 100 mg in sodium chloride 0.9 % 100 mL IVPB  Status:  Discontinued     100 mg 78 mL/hr over 100 Minutes Intravenous Every 24 hours 07/23/17 2038 07/23/17 2039   07/23/17 2200  piperacillin-tazobactam (ZOSYN) IVPB 3.375 g  Status:  Discontinued     3.375 g 12.5 mL/hr over 240 Minutes Intravenous Every 8 hours 07/23/17 2043 07/24/17 2006   07/23/17 2100  anidulafungin (ERAXIS) 200 mg in sodium chloride 0.9 % 200 mL IVPB     200 mg 78 mL/hr over 200 Minutes Intravenous  Once 07/23/17 2038 07/24/17 0239   07/23/17 2100  vancomycin (VANCOCIN) 1,500 mg in sodium chloride 0.9 % 500 mL IVPB     1,500 mg 250 mL/hr over 120 Minutes Intravenous  Once 07/23/17 2041 07/23/17 2317   07/23/17 1515  piperacillin-tazobactam (ZOSYN) IVPB 3.375 g     3.375 g 100 mL/hr over 30 Minutes Intravenous  Once 07/23/17 1511 07/23/17 1611      Assessment/Plan: Perforated distal small boweland ischemia of cecum 1.S/Pexploratory laparotomy, small bowel resection without anastomosis, placement of open wound VAC system 07/23/17 Dr. Minerva AreolaEric Wilson/Dr. Claud KelpHaywood Ingram 2. S/p Reexploration of abdomen, ileocecectomy, creation of end ileostomy,closure of abdominal wall, 07/25/17, Dr. Almond LintFaera Byerly - Post op ileus  3. CT 12/11: ? Recurrent bowel perforation, ? Left hepatic abscess, ? RLL penumonia, mild dehiscence of anterior abdominal wound. Exploratory laparotomy, drainage of abdominal abscess and evacuation of pelvic hematoma, RUQ drain, and left lateral drain placement, 07/31/17, Dr. Glenna FellowsBenjamin Hoxworth (Findings: Subdiaphragmatic and subhepatic abscesses.Large organizing pelvic hematoma. Apparent necrotic tissue left lobe of the liver.) Sepsis/shock -vasopressors off - WBC stable today Bacteremia - Enterobacteriaceae + E Coli Cardiac arrest - Code Blue 08/08/17 - reintubated/pressors/increase of antibiotics Acute respiratory failure  -remains on vent  Acute renal failure-CRRT discontinued 07/27/17  - creatinine rising again 2.70 today Malnutrition/Decondtioning - Prealbumin <5 last time checked, on tpn Anemia - transfused 1 Unit PRBC12/8/18,07/29/17, and12/11/18 - 2 PRBC, needs more blood today with hct of 17 GI- needs continued dressing changes, wound is not well but not much more to do is certainly at risk, continue drains will need repeat ct sometime next week WU:[id number]:Vancomycin 07/23/17- 07/24/17,restarted 12/20 day 1,Zosyn 07/23/17- 12/14,anidulafungin 07/23/17 - 12/17,restarted 12/20 day 1,Meropenem 12/14 - 12/17 =>>Zosyn restarted 12/17 =>>day 5  anidulafungin restarted 12/20 =>> day 2 Vancomycin restarted 12/20 =>> day 2 NFA:OZHYVT:SCDs - Heparin Follow-up:Dr. Ardith DarkEric Wilson    Lajuanda Penick 08/10/2017

## 2017-08-10 NOTE — Progress Notes (Signed)
eLink Physician-Brief Progress Note Patient Name: Caleb LaundryFrederick Singh DOB: 07-26-1956 MRN: [id number]   Date of Service  08/10/2017  HPI/Events of Note  Hypokalemia  eICU Interventions  Potassium replaced     Intervention Category Intermediate Interventions: Electrolyte abnormality - evaluation and management  Rashanna Christiana 08/10/2017, 5:44 AM

## 2017-08-10 NOTE — Progress Notes (Signed)
Left femoral arterial pressure waveform noted to be flat. Upon assessment, line positional. Unable to reposition appropriately to keep an active waveform to monitor. [facility]. Okay with discontinuation of arterial catheter. Dr. Darrick Pennaeterding to place order. RN made aware.

## 2017-08-11 LAB — GLUCOSE, CAPILLARY
GLUCOSE-CAPILLARY: 118 mg/dL — AB (ref 65–99)
Glucose-Capillary: 109 mg/dL — ABNORMAL HIGH (ref 65–99)
Glucose-Capillary: 130 mg/dL — ABNORMAL HIGH (ref 65–99)
Glucose-Capillary: 120 mg/dL — ABNORMAL HIGH (ref 65–99)
Glucose-Capillary: 121 mg/dL — ABNORMAL HIGH (ref 65–99)

## 2017-08-11 LAB — BASIC METABOLIC PANEL
ANION GAP: 4 — AB (ref 5–15)
BUN: 61 mg/dL — ABNORMAL HIGH (ref 6–20)
CALCIUM: 5.3 mg/dL — AB (ref 8.9–10.3)
CO2: 22 mmol/L (ref 22–32)
Chloride: 118 mmol/L — ABNORMAL HIGH (ref 101–111)
Creatinine, Ser: 2.41 mg/dL — ABNORMAL HIGH (ref 0.61–1.24)
GFR calc Af Amer: 31 mL/min — ABNORMAL LOW (ref >60)
GFR, EST NON AFRICAN AMERICAN: 27 mL/min — AB (ref >60)
Glucose, Bld: 125 mg/dL — ABNORMAL HIGH (ref 65–99)
Potassium: 5.6 mmol/L — ABNORMAL HIGH (ref 3.5–5.1)
SODIUM: 144 mmol/L (ref 135–145)

## 2017-08-11 LAB — CBC
HCT: 23.4 % — ABNORMAL LOW (ref 39.0–52.0)
HEMOGLOBIN: 7.8 g/dL — AB (ref 13.0–17.0)
MCH: 30.5 pg (ref 26.0–34.0)
MCHC: 33.3 g/dL (ref 30.0–36.0)
MCV: 91.4 fL (ref 78.0–100.0)
Platelets: 378 10*3/uL (ref 150–400)
RBC: 2.56 MIL/uL — AB (ref 4.22–5.81)
RDW: 15.3 % (ref 11.5–15.5)
WBC: 11.4 10*3/uL — AB (ref 4.0–10.5)

## 2017-08-11 LAB — MAGNESIUM: MAGNESIUM: 1.1 mg/dL — AB (ref 1.7–2.4)

## 2017-08-11 LAB — PHOSPHORUS: PHOSPHORUS: 3.5 mg/dL (ref 2.5–4.6)

## 2017-08-11 LAB — POTASSIUM: POTASSIUM: 4.1 mmol/L (ref 3.5–5.1)

## 2017-08-11 MED ORDER — FAT EMULSION 20 % IV EMUL
180.0000 mL | INTRAVENOUS | Status: AC
  Administered 2017-08-11: 180 mL via INTRAVENOUS
  Filled 2017-08-11: qty 250

## 2017-08-11 MED ORDER — MAGNESIUM SULFATE 4 GM/100ML IV SOLN
4.0000 g | Freq: Once | INTRAVENOUS | Status: AC
  Administered 2017-08-11: 4 g via INTRAVENOUS
  Filled 2017-08-11: qty 100

## 2017-08-11 MED ORDER — CALCIUM GLUCONATE 10 % IV SOLN
2.0000 g | Freq: Once | INTRAVENOUS | Status: AC
Start: 2017-08-11 — End: 2017-08-11
  Administered 2017-08-11: 2 g via INTRAVENOUS
  Filled 2017-08-11: qty 20

## 2017-08-11 MED ORDER — TRACE MINERALS CR-CU-MN-SE-ZN 10-1000-500-60 MCG/ML IV SOLN
INTRAVENOUS | Status: AC
  Administered 2017-08-11: 17:00:00 via INTRAVENOUS
  Filled 2017-08-11: qty 2400
  Filled 2017-08-11: qty 2000

## 2017-08-11 MED ORDER — DEXTROSE 5 % IV SOLN
INTRAVENOUS | Status: DC
  Administered 2017-08-11 – 2017-08-14 (×4): via INTRAVENOUS

## 2017-08-11 NOTE — Progress Notes (Signed)
PT Cancellation Note  Patient Details Name: Caleb Singh MRN: [id number] DOB: Dec 31, 1955   Cancelled Treatment:    Reason Eval/Treat Not Completed: Medical issues which prohibited therapy. Continues to be on ventilator. Will follow for extubation.    Rada HayHill, Amayia Ciano [name] 08/11/2017, 11:34 AM Blanchard KelchKaren Girtrude Enslin PT [phone]

## 2017-08-11 NOTE — Progress Notes (Signed)
eLink Physician-Brief Progress Note Patient Name: Lamar LaundryFrederick Barrington DOB: [birth year] MRN: [id number]   Date of Service  08/11/2017  HPI/Events of Note  hypocalcemia  eICU Interventions  Calcium replaced     Intervention Category Intermediate Interventions: Electrolyte abnormality - evaluation and management  Tremell Reimers 08/11/2017, 5:39 AM

## 2017-08-11 NOTE — Progress Notes (Signed)
CRITICAL VALUE ALERT  Critical Value:  Calcium 5.3  Date & Time Notied:  08/11/17 @5 :10am  Provider Notified: Dr. Darrick Pennaeterding (E-link)  Orders Received/Actions taken: Calcium gluconate 2g IVpb

## 2017-08-11 NOTE — Progress Notes (Signed)
PULMONARY / CRITICAL CARE MEDICINE   Name: Caleb Singh MRN: [id number] DOB: 04-03-1956    ADMISSION DATE:  07/23/2017 CONSULTATION DATE: 07/23/2017   CHIEF COMPLAINT: Abdominal pain  BRIEF SUMMARY: 61 y/o male former smoker with severe abdominal pain, and found to have free air in abdomen from perforated small bowel and ischemic cecum.  S/P resection of terminal ileum for ischemia.  Required re-look exploratory laparotomy with drainage of abdominal abscesses and evacuation of pelvic hematoma.    PMHx of ETOH, PE.  SUBJECTIVE:    Patient was tachypneic during pressure support weaning on 12/22, tolerating better today (on fentanyl 200), respiratory rate high 20s Transfusion 12/22, hemoglobin responded appropriately  VITAL SIGNS: BP 137/61   Pulse 85   Temp (!) 100.9 F (38.3 C)   Resp (!) 21   Ht 5\' 10"  (1.778 m)   Wt 82.3 kg (181 lb 7 oz)   SpO2 100%   BMI 26.03 kg/m   INTAKE / OUTPUT: I/O last 3 completed shifts: In: 8 [I.V.:6496; Blood:798; IV Piggyback:780] Out: 5280 [Urine:4150; Drains:330; Stool:800]  PHYSICAL EXAMINATION:  General: Chronically ill, thin, ventilated, no distress, currently on PSV HEENT: ET tube in place, oropharynx otherwise clear Neuro: Awake, interacting, attempts to speak, follows commands, moves all extremities CV: Regular, no murmur, occasional PVC PULM: decreased at both base but clear, no wheeze GI: Mild distention, midline dressing in place, ostomy with liquid stool, 3 drains present with purulent material Extremities: warm/dry, trace to 1+ LE edema  Skin: No rash seen  PULMONARY Recent Labs  Lab 08/06/17 0957 08/07/17 0024 08/08/17 0133 08/08/17 0238 08/09/17 1056  PHART 7.448 7.403  --  7.424 7.478*  PCO2ART 25.2* 32.3  --  32.9 34.3  PO2ART 86.6 112*  --  304* 99.8  HCO3 17.1* 19.7* 22.6 21.2 25.1  O2SAT 96.6 98.0 77.9 99.8 97.9    CBC Recent Labs  Lab 08/10/17 0516 08/10/17 1604 08/11/17 0337  HGB 5.6* 8.5*  7.8*  HCT 17.4* 26.5* 23.4*  WBC 10.4 12.2* 11.4*  PLT 392 385 378    COAGULATION Recent Labs  Lab 08/08/17 1329  INR 1.45    CARDIAC   Recent Labs  Lab 08/07/17 2331 08/08/17 0145 08/08/17 1120  TROPONINI 0.12* 0.13* 0.17*   No results for input(s): PROBNP in the last 168 hours.   CHEMISTRY Recent Labs  Lab 08/05/17 0620 08/06/17 0510 08/07/17 0500 08/07/17 2211 08/08/17 0145 08/09/17 0500 08/10/17 0415 08/11/17 0337  NA 155* 153* 154* 155* 153* 149* 146* 144  K 3.7 3.4* 3.5 3.6 3.6 3.0* 3.2* 5.6*  CL 127* >130* 127* 128* 125* 119* 118* 118*  CO2 20* 18* 23 23 21* 25 23 22   GLUCOSE 139* 145* 124* 149* 191* 140* 124* 125*  BUN 65* 72* 71* 68* 65* 78* 70* 61*  CREATININE 1.65* 2.08* 2.24* 2.05* 2.27* 2.70* 2.53* 2.41*  CALCIUM 8.8* 8.8* 8.3* 8.4* 8.5* 7.5* 7.6* 5.3*  MG 2.5* 2.5* 2.3  --  2.6*  --  2.1  --   PHOS 3.3 3.0 3.9  --  5.4*  --  3.0  --    Estimated Creatinine Clearance: 32.4 mL/min (A) (by C-G formula based on SCr of 2.41 mg/dL (H)).   LIVER Recent Labs  Lab 08/05/17 0620 08/07/17 2211 08/08/17 0145 08/08/17 1329 08/10/17 0415  AST 35 36 42*  --  33  ALT 28 31 33  --  26  ALKPHOS 112 105 111  --  81  BILITOT 2.3* 2.4*  2.0*  --  2.0*  PROT 7.5 7.3 7.7  --  6.6  ALBUMIN 2.0* 1.9* 2.1*  --  1.6*  INR  --   --   --  1.45  --      INFECTIOUS No results for input(s): LATICACIDVEN, PROCALCITON in the last 168 hours.   ENDOCRINE CBG (last 3)  Recent Labs    08/10/17 2321 08/11/17 0313 08/11/17 0733  GLUCAP 95 109* 118*    IMAGING Dg Chest Port 1 View  Result Date: 08/10/2017 CLINICAL DATA:  Respiratory failure. Former smoker. Follow-up exam. EXAM: PORTABLE CHEST 1 VIEW COMPARISON:  08/09/2017 FINDINGS: There is persistent lung base opacity consistent with atelectasis. Remainder of the lungs is clear. Endotracheal tube and right PICC are stable in well positioned. Suspect small pleural effusions.  No pneumothorax. IMPRESSION: 1.  No change from the previous day's study. 2. Persistent basilar atelectasis. 3. Support apparatus is stable and well positioned. Electronically Signed   By: Amie Portlandavid  Ormond M.D.   On: 08/10/2017 07:04   Dg Abd Portable 1v  Result Date: 08/10/2017 CLINICAL DATA:  61 year old male status post nasogastric tube placement EXAM: PORTABLE ABDOMEN - 1 VIEW COMPARISON:  CT scan of the abdomen and pelvis 08/08/2017 FINDINGS: A nasogastric tube is been placed. The tip overlies the stomach. The proximal side hole is in the region of the gastroesophageal junction. The bowel gas pattern is not obstructed. Patchy airspace opacity in the left lower lobe consistent with known effusion and atelectasis. Left upper quadrant pigtail drainage catheter. IMPRESSION: 1. The proximal side hole of the gastric tube overlies the gastroesophageal junction. Recommend advancing 3-5 cm for more optimal placement. 2. Left-sided pleural effusion and associated atelectasis versus infiltrate. Electronically Signed   By: Malachy MoanHeath  McCullough M.D.   On: 08/10/2017 15:25     CULTURES Blood 12/04: E coli Blood 12/10: negative Surgical wound 12/12:  Pan sensitive E. Coli Surgical drain 12/20>> GS>> FEW GRAM POSITIVE COCCI IN PAIRS IN CLUSTERS  FEW GRAM NEGATIVE RODS>> CX:   ANTIBIOTICS Vancomycin 12/04 >> 12/04 Zosyn 12/04 >> 12/13 Anidulafungin 12/04 >> 12/17 Meropenem 12/14 >> 12/17 Zosyn 12/17 >>  Anidulafungin 12/19 >>  Vanco 12/19 >>   EVENTS 12/04 Admit 12/06 Ileostomy, closure of abdominal wound 12/14 Laparotomy for abscess, evacuation of hematoma  12/16 Off precedex  12/18 Bicarb added for NG acidosis, tx 1 unit PRBC 12/19 Decompensated, PEA arrest > intubated  LINES/TUBES Lt [place] CVL 12/04 >> 12/16 RUE PICC 12/16 >>   ASSESSMENT / PLAN:  GI A: Peritonitis from perforated small bowel and ischemic cecum s/p SB ileocecectomy, ileostomy. Abdominal abscess s/p re-exploration 12/14. E coli bacteremia. R drain placed  12/20 with purulent drainage Per CCS>> Not repeat surgical candidate  P: Post-operative care, TPN / nutrition per CCS Zosyn, anidulafungin; likely discontinue vancomycin once new drain cultures are finalized > still pending Greatly appreciate IR assistance with drain placement TPA / lipids per pharmacy recs Note plans for repeat Ct abdomen next week, assess for potential removal or adjustment drains?   NEURO A: Agitated delirium with hx of ETOH>>resolved; MS now fluctuates with pain needs and metabolic / infxn status Deconditioning of Critical Illness  P: Fentanyl drip for pain control and sedation PT efforts  Frequent re-orientation  RESPIRATORY A: Acute Respiratory Insufficiency - in setting of PEA arrest, abdominal abscess CXR 12/21>> Stable bibasilar subsegmental atelectasis with minimal pleural effusions  P: Continue PRVC 8 cc/kg Continue efforts at PSV. Tolerating better on 12/23 albeit on fentanyl  200. ? Assess extubation next 24-48h if continued improvement Wean PEEP / FiO2 for sats > 90% Follow CXR   CARDIAC  A: A fib with RVR Hypertension Net negative since admission P: Tele monitoring  Metoprolol and hydralazine are on hold, may be able to restart soon as BP improving PRN labetalol for SBP > 170  RENAL A: Recurrent acute Renal Failure 2nd to ATN (he required CVVH earlier in the hospitalization).  Improving 12/22 and 23 Non Anion Gap Acidosis, improving Hypernatremia.>> improving Hypokalemia.>> repleted 12/21 and 12/22 Hyperkalemia 12/23 P: Off Na bicarb continue same free water Follow BMP, UOP; recheck pm K+ 12/23 Note 4-5 L positive daily over the last several days.  Want to avoid profound volume overload.  May need to adjust TPN, IV fluids in the next 24 hours Avoid nephrotoxic agents, ensure adequate renal perfusion  HEMATOLOGY  A: Anemia -progressive on 12/22.  Question whether he may have intraperitoneal bleeding post drain placement No obvious  external source of bleeding P: Follow serial CBC PRBC 2 units ordered 12/22 Continue to transfuse for HGB < 7 Consider repeat CT scan of the abdomen in coming days if his hemoglobin continues to drift down  ENDOCRINE A: Hyperglycemia. P: SSI   INFECTION A: Peritonitis, with complicated intra-abdominal anatomy and abscesses P: New left upper quadrant drain continues to produce purulent fluid.  The other 2 drains are not producing as much Recommend continue Zosyn plus anidulafungin, vancomycin until culture results from the most recent peritoneal drain are available, still pending   DVT prophylaxis: SQ heparin SUP: Protonix Diet: NPO, TPN Goals of care: Full code  Family - Sister updated 12/20.  She reports the patient is not married and does have grown daughters. They reportedly do not have a very close relationship (maybe talking once every three months).  The daughters do live in town.  [place].  He does not have an HCPOA.  She is attempting to find his daughters to notify them of his illness.  We discussed the nature of his critical illness and the plan going forward.  She understands that he is critically ill and that this will be a difficult course if he survives.  She is in agreement to continue aggressive care for now but understands that if his trend is progressive decline despite aggressive measures that we may need to reconsider our approach.    08/11/2017>> No family at bedside   Independent CC time 8033 minutes  Levy Pupaobert Dierks Wach, MD, PhD 08/11/2017, 9:04 AM [facility] [phone] or if no answer [phone]

## 2017-08-11 NOTE — Progress Notes (Signed)
PHARMACY - ADULT TOTAL PARENTERAL NUTRITION CONSULT NOTE   Pharmacy Consult for TPN Indication: prolonged ileus  Patient Measurements: Body mass index is 26.67 kg/m. Filed Weights   08/09/17 0917 08/10/17 0410 08/11/17 0420  Weight: 173 lb 8 oz (78.7 kg) 177 lb 7.5 oz (80.5 kg) 185 lb 13.6 oz (84.3 kg)    HPI: 25 YOM admitted on 12/4 with c/o severe and worsening abdominal distention, pain, nonbloody, nonbilious, non-coffee-ground emesis and lack of bowel movements x3 days.  No significant PMH.  He was found to have perforated small bowel s/p repair in OR, and remains intubated/sedated on pressors postop.  Pharmacy is consulted to dose TPN.  Significant events:  12/4 SB resection, open wound VAC.  NG tube placed 12/5 Started CRRT 12/6 Repeat OR on for washout, ileocecectomy, creation of end ileostomy,closure of abdominal wall 12/9 CRRT off 12/9 am 12/12 Exploratory laparotomy, drainage of abdominal abscess and evacuation of pelvic hematoma, RUQ drain, and left lateral drain placement. Findings per surgery note: Subdiaphragmatic and subhepatic abscesses.Large organizing pelvic hematoma. Apparent necrotic tissue left lobe of the liver. 12/14 Extubated 12/17 Some drainage through his ileostomy, but no bowel sounds.  No diet, may have ice chips, sips of clears. 12/18 Transfuse PRBC, start bicarb, cont sips of clears only 12/20 Pt unresponsive, code blue, now re-intubated.  TPN was stopped during code.  Additional drain placed by IR.  Insulin requirements past 24 hours: 4 units SSI/24h  Current Nutrition: NPO.  TPN at goal rate  IVF: D5W at 77m/hr  Central access: CVC triple lumen placed 12/4 TPN start date: 12/7  ASSESSMENT                                                                                                          Today:   Glucose:  goal < 150 (no hx of DM), CBGs controlled and trending down in range from previous despite additional D5 infusion and decreasing SSI  use.  Range 95-133.  Electrolytes: Na improved to WNL (now off NaBicarb). Cl elevated but improving, K high but w/ moderate hemolysis.  CorrCa 7.22 (replacing).  Phos WNL, Mag low.  Electrolytes removed from TPN on 12/17.  NaBicarb infusion started 12/18, stopped 12/21  Renal:  SCr improved to 2.4, (off CRRT 12/9a) s/p resuscitation and addition of Vanc/Zosyn/Eraxis, and pressors.    I/O = +2.7 L/24h; drain output 205 mL (3 drains), stool output 400 mL in ostomy; UOP 2650 mL   Weight decreased to less than admit weight (admission weight 79.4 kg), but now increasing.  Weight increased from 80.5 kg to 82.3 kg overnight.  Watch volume status.  LFTs:  AST, ALT, Alk Phos wnl, Tbili elevated but improved, not jaundiced (12/20)  TGs: 233 (12/5), 400 (12/6), 281 (12/7 - propofol stopped), 322 (12/10), 231 (12/14), 216 (12/17)  Prealbumin:  9.3 (12/5), 9 (12/10), 18.8 (12/17)  NUTRITIONAL GOALS  RD recs (updated 12/21): 110-126g Protein, 2269 Kcal  Based on published guidelines, while the patient meets ICU status the initiation of lipids is being delayed for 7 days or until transition out of ICU. Planned start date for lipids is 12/14.  Goal will be to meet 100% of the patient's protein needs and approximately 80% of their caloric needs.  Clinimix 5/15 @ 100 mL/hr and lipids 20% at 31m/hr over 12h to provide  120 grams protein (100% of goal) and 2064 kcal (91% of goal).  PLAN                                                                                                                         Now:   CaGluconate 2g IV per MD Magnesium 4g IV x1   At 1800 today:  Continue Clinimix 5/15 NO electrolyte formula at 100 ml/hr   Started lipids 12/14 (held for first week of ICU status) at 123mhr over 12h (dose reduction due to mildly elevated trigs)  TPN to contain standard multivitamins and trace element  daily.  IVF per MD, dicussed with PCCM -  D5W decreased to 3062mr  Continue CBGs q4h checks and SSI moderate scale.   TPN lab panels on Mondays & Thursdays.    Consider cyclic TPN if prolonged course is needed.  ChrGretta ArabarmD, BCPS Pager [phone]/23/2018 7:32 AM

## 2017-08-11 NOTE — Progress Notes (Signed)
OT Cancellation Note  Patient Details Name: Caleb Singh MRN: [id number] DOB: December 09, 1955   Cancelled Treatment:    Reason Eval/Treat Not Completed: Medical issues which prohibited therapy  Medical issues which prohibited therapy. Continues to be on ventilator. Will follow for extubation.     Caleb Singh, Caleb Singh 08/11/2017, 11:39 AM

## 2017-08-11 NOTE — Consult Note (Addendum)
WOC Nurse ostomy follow up:  Stoma type/location: RLQ ileostomy on 07/23/17.  Stomal assessment/size: Oval, 1 and 1/8 inches long and 1 and 1/2 inches wide. Mucocutaneous separation from 6-12 o'clock as previously noted.  Peristomal assessment: MC separation covered with skin barrier ring, and applied pouching system on top of ring using a one piece soft convex pouch.  Output: Mod amt liquid green-brown liquid stool  Education provided: Pt is critically ill on the vent and did not watch or participate in the pouch change process.  A family member in the room walked away and did not want to watch the procedure. Supplies in the room for staff nurse use. Enrolled patient in [employer] Discharge program: No WOC team will continue to follow for weekly ostomy assessments while in ICU. Cammie Mcgeeawn Sheana Bir MSN, RN, CWOCN, Paradise[place]CN-AP, CNS [phone]

## 2017-08-11 NOTE — Progress Notes (Signed)
11 Days Post-Op   Subjective/Chief Complaint: Awake, alert, og in yesterday   Objective: Vital signs in last 24 hours: Temp:  [99.9 F (37.7 C)-101.8 F (38.8 C)] 100.9 F (38.3 C) (12/23 0700) Pulse Rate:  [70-106] 85 (12/23 0700) Resp:  [17-29] 21 (12/23 0700) BP: (119-181)/(60-105) 137/61 (12/23 0700) SpO2:  [97 %-100 %] 100 % (12/23 0700) FiO2 (%):  [30 %] 30 % (12/23 0328) Weight:  [84.3 kg (185 lb 13.6 oz)] 84.3 kg (185 lb 13.6 oz) (12/23 0420) Last BM Date: 08/10/17  Intake/Output from previous day: 12/22 0701 - 12/23 0700 In: 5991.5 [I.V.:4543.5; Blood:798; IV Piggyback:650] Out: 3255 [Urine:2650; Drains:205; Stool:400] Intake/Output this shift: No intake/output data recorded.   General awake alert follow commands on vent Lungs coarse bilateral breath sounds cv rrr abd drains with tan fluid present, open wound with necrotic base with dehiscence, stoma pink and  functional   Lab Results:  Recent Labs    08/10/17 1604 08/11/17 0337  WBC 12.2* 11.4*  HGB 8.5* 7.8*  HCT 26.5* 23.4*  PLT 385 378   BMET Recent Labs    08/10/17 0415 08/11/17 0337  NA 146* 144  K 3.2* 5.6*  CL 118* 118*  CO2 23 22  GLUCOSE 124* 125*  BUN 70* 61*  CREATININE 2.53* 2.41*  CALCIUM 7.6* 5.3*   PT/INR Recent Labs    08/08/17 1329  LABPROT 17.5*  INR 1.45   ABG Recent Labs    08/09/17 1056  PHART 7.478*  HCO3 25.1    Studies/Results: Dg Chest Port 1 View  Result Date: 08/10/2017 CLINICAL DATA:  Respiratory failure. Former smoker. Follow-up exam. EXAM: PORTABLE CHEST 1 VIEW COMPARISON:  08/09/2017 FINDINGS: There is persistent lung base opacity consistent with atelectasis. Remainder of the lungs is clear. Endotracheal tube and right PICC are stable in well positioned. Suspect small pleural effusions.  No pneumothorax. IMPRESSION: 1. No change from the previous day's study. 2. Persistent basilar atelectasis. 3. Support apparatus is stable and well positioned.  Electronically Signed   By: Amie Portlandavid  Ormond M.D.   On: 08/10/2017 07:04   Dg Abd Portable 1v  Result Date: 08/10/2017 CLINICAL DATA:  [age] year old male status post nasogastric tube placement EXAM: PORTABLE ABDOMEN - 1 VIEW COMPARISON:  CT scan of the abdomen and pelvis 08/08/2017 FINDINGS: A nasogastric tube is been placed. The tip overlies the stomach. The proximal side hole is in the region of the gastroesophageal junction. The bowel gas pattern is not obstructed. Patchy airspace opacity in the left lower lobe consistent with known effusion and atelectasis. Left upper quadrant pigtail drainage catheter. IMPRESSION: 1. The proximal side hole of the gastric tube overlies the gastroesophageal junction. Recommend advancing 3-5 cm for more optimal placement. 2. Left-sided pleural effusion and associated atelectasis versus infiltrate. Electronically Signed   By: Malachy MoanHeath  McCullough M.D.   On: 08/10/2017 15:25    Anti-infectives: Anti-infectives (From admission, onward)   Start     Dose/Rate Route Frequency Ordered Stop   08/10/17 0500  vancomycin (VANCOCIN) 1,500 mg in sodium chloride 0.9 % 500 mL IVPB     1,500 mg 250 mL/hr over 120 Minutes Intravenous Every 48 hours 08/08/17 0728     08/08/17 2200  anidulafungin (ERAXIS) 100 mg in sodium chloride 0.9 % 100 mL IVPB     100 mg 78 mL/hr over 100 Minutes Intravenous Every 24 hours 08/08/17 0524     08/08/17 0445  vancomycin (VANCOCIN) 1,500 mg in sodium chloride 0.9 % 500 mL IVPB  1,500 mg 250 mL/hr over 120 Minutes Intravenous  Once 08/08/17 0431 08/08/17 0659   08/08/17 0430  vancomycin (VANCOCIN) 1,250 mg in sodium chloride 0.9 % 250 mL IVPB  Status:  Discontinued     1,250 mg 166.7 mL/hr over 90 Minutes Intravenous  Once 08/08/17 0418 08/08/17 0428   08/08/17 0415  anidulafungin (ERAXIS) 200 mg in sodium chloride 0.9 % 200 mL IVPB     200 mg 78 mL/hr over 200 Minutes Intravenous  Once 08/08/17 0413 08/08/17 0816   08/05/17 1000   piperacillin-tazobactam (ZOSYN) IVPB 3.375 g     3.375 g 12.5 mL/hr over 240 Minutes Intravenous Every 8 hours 08/05/17 0946     08/02/17 1400  meropenem (MERREM) 1 g in sodium chloride 0.9 % 100 mL IVPB  Status:  Discontinued     1 g 200 mL/hr over 30 Minutes Intravenous Every 12 hours 08/02/17 1111 08/05/17 0945   07/29/17 0600  piperacillin-tazobactam (ZOSYN) IVPB 3.375 g  Status:  Discontinued     3.375 g 12.5 mL/hr over 240 Minutes Intravenous Every 8 hours 07/29/17 0518 08/02/17 1036   07/25/17 2000  vancomycin (VANCOCIN) IVPB 1000 mg/200 mL premix  Status:  Discontinued     1,000 mg 200 mL/hr over 60 Minutes Intravenous Every 48 hours 07/23/17 2042 07/24/17 0956   07/24/17 2200  piperacillin-tazobactam (ZOSYN) 3.375 g in dextrose 5 % 50 mL IVPB  Status:  Discontinued     3.375 g 100 mL/hr over 30 Minutes Intravenous Every 6 hours 07/24/17 2006 07/29/17 0518   07/24/17 2100  anidulafungin (ERAXIS) 100 mg in sodium chloride 0.9 % 100 mL IVPB  Status:  Discontinued     100 mg 78 mL/hr over 100 Minutes Intravenous Every 24 hours 07/23/17 2039 08/05/17 0945   07/24/17 0000  anidulafungin (ERAXIS) 100 mg in sodium chloride 0.9 % 100 mL IVPB  Status:  Discontinued     100 mg 78 mL/hr over 100 Minutes Intravenous Every 24 hours 07/23/17 2038 07/23/17 2039   07/23/17 2200  piperacillin-tazobactam (ZOSYN) IVPB 3.375 g  Status:  Discontinued     3.375 g 12.5 mL/hr over 240 Minutes Intravenous Every 8 hours 07/23/17 2043 07/24/17 2006   07/23/17 2100  anidulafungin (ERAXIS) 200 mg in sodium chloride 0.9 % 200 mL IVPB     200 mg 78 mL/hr over 200 Minutes Intravenous  Once 07/23/17 2038 07/24/17 0239   07/23/17 2100  vancomycin (VANCOCIN) 1,500 mg in sodium chloride 0.9 % 500 mL IVPB     1,500 mg 250 mL/hr over 120 Minutes Intravenous  Once 07/23/17 2041 07/23/17 2317   07/23/17 1515  piperacillin-tazobactam (ZOSYN) IVPB 3.375 g     3.375 g 100 mL/hr over 30 Minutes Intravenous  Once  07/23/17 1511 07/23/17 1611      Assessment/Plan: Perforated distal small boweland ischemia of cecum 1.S/Pexploratory laparotomy, small bowel resection without anastomosis, placement of open wound VAC system 07/23/17 Dr. Gaynelle Adu 2. S/p Reexploration of abdomen, ileocecectomy, creation of end ileostomy,closure of abdominal wall, 07/25/17, Dr. Almond Lint 3. Exploratory laparotomy, drainage of abdominal abscess and evacuation of pelvic hematoma, RUQ drain, and left lateral drain placement, 07/31/17, Dr. Glenna Fellows (Findings: Subdiaphragmatic and subhepatic abscesses.Large organizing pelvic hematoma. Apparent necrotic tissue left lobe of the liver.) Sepsis/shock -resolved, wbc decreasing Bacteremia - Enterobacteriaceae + E Coli, on abx per ccm, awaiting additional drain cultures Cardiac arrest - Code Blue 08/08/17 - reintubated/pressors/increase of antibiotics Acute respiratory failure -remains on vent per ccm Acute  renal failure-CRRT discontinued 07/27/17 - creatinine continues to decrease Malnutrition/Decondtioning - Prealbumin <5 last time checked, on tpn Anemia - transfused 1 Unit PRBC12/8/18,07/29/17, and12/11/18 - 2 PRBC, got another unit of blood yesterday and I think has appropriate hct this am, would follow GI- needs continued dressing changes, wound is dehisced but not much more to do is certainly at risk, continue drains will need repeat ct sometime next week after last on 20th, some drains may be able to come out soon- 1 and 2 RU:[id number]:Vancomycin 07/23/17- 07/24/17,restarted 12/20 day 1,Zosyn 07/23/17- 12/14,anidulafungin 07/23/17 - 12/17,restarted 12/20 day 1,Meropenem 12/14 - 12/17 =>>Zosyn restarted 12/17 =>>day5anidulafunginrestarted 12/20 =>>day 2 Vancomycin restarted 12/20 =>>day 2 JYN:WGNFVT:SCDs - Heparin Follow-up:Dr. Ardith DarkEric Wilson    Caleb Singh 08/11/2017

## 2017-08-11 NOTE — Progress Notes (Signed)
Pharmacy Antibiotic Note  Caleb Singh is a 61 y.o. male admitted on 07/23/2017 with sepsis, intra-abdominal infection.  Pharmacy has been consulted for Vancomycin, Zosyn, Eraxis dosing.  Today, 08/11/2017: Tm 101.8 WBC improved to 11.4 SCr 2.41  Plan:  Continue Anidulafungin 100 mg IV q24h  Continue Zosyn 3.375g IV Q8H infused over 4hrs.  Continue Vancomycin 1500 mg IV q48h.  Measure Vanc trough at steady state.  Follow up renal fxn, culture results, and clinical course.   Height: 5\' 10"  (177.8 cm) Weight: 181 lb 7 oz (82.3 kg) IBW/kg (Calculated) : 73  Temp (24hrs), Avg:100.6 F (38.1 C), Min:99.9 F (37.7 C), Max:101.8 F (38.8 C)  Recent Labs  Lab 08/07/17 2211 08/08/17 0145 08/09/17 0500 08/10/17 0415 08/10/17 0516 08/10/17 1604 08/11/17 0337  WBC  --  18.5*  --  10.2 10.4 12.2* 11.4*  CREATININE 2.05* 2.27* 2.70* 2.53*  --   --  2.41*    Estimated Creatinine Clearance: 32.4 mL/min (A) (by C-G formula based on SCr of 2.41 mg/dL (H)).    No Known Allergies  Antimicrobials this admission: 12/4 Vancomycin>>12/5 12/20 >> 12/4 Zosyn>>12/14 12/17 >> 12/14 meropenem >> 12/17 12/4 Eraxis >> 12/17 12/20 >>  Dose adjustments this admission:   Microbiology results: 12/4 MRSA PCR: neg 12/4BCx: Ecoli - PANSENSITIVE       12/5  BCID = E.coli, no resistance 12/10 BCx: NGF 12/11 UCx: NGF 12/12 abscess cx: E coli (PANSENSITIVE) 12/20 Abscess: GS: few GPC, few GNR.   Cxt reincubated  Thank you for allowing pharmacy to be a part of this patient's care.  Lynann Beaverhristine Taleisha Kaczynski PharmD, BCPS Pager [phone] 08/11/2017 10:35 AM

## 2017-08-12 LAB — DIFFERENTIAL
BAND NEUTROPHILS: 0 %
BASOS ABS: 0.1 10*3/uL (ref 0.0–0.1)
Basophils Relative: 1 %
Blasts: 0 %
EOS ABS: 0.3 10*3/uL (ref 0.0–0.7)
Eosinophils Relative: 3 %
LYMPHS ABS: 1.5 10*3/uL (ref 0.7–4.0)
Lymphocytes Relative: 15 %
METAMYELOCYTES PCT: 0 %
MONO ABS: 0.9 10*3/uL (ref 0.1–1.0)
MONOS PCT: 9 %
MYELOCYTES: 0 %
NEUTROS ABS: 7.5 10*3/uL (ref 1.7–7.7)
Neutrophils Relative %: 72 %
Promyelocytes Absolute: 0 %
nRBC: 0 /100 WBC

## 2017-08-12 LAB — CBC
HCT: 25.8 % — ABNORMAL LOW (ref 39.0–52.0)
HEMATOCRIT: 38.3 % — AB (ref 39.0–52.0)
Hemoglobin: 13.3 g/dL (ref 13.0–17.0)
Hemoglobin: 8.1 g/dL — ABNORMAL LOW (ref 13.0–17.0)
MCH: 25.5 pg — ABNORMAL LOW (ref 26.0–34.0)
MCH: 29.1 pg (ref 26.0–34.0)
MCHC: 34.7 g/dL (ref 30.0–36.0)
MCHC: 31.4 g/dL (ref 30.0–36.0)
MCV: 73.5 fL — ABNORMAL LOW (ref 78.0–100.0)
MCV: 92.8 fL (ref 78.0–100.0)
PLATELETS: 261 10*3/uL (ref 150–400)
PLATELETS: 463 10*3/uL — AB (ref 150–400)
RBC: 5.21 MIL/uL (ref 4.22–5.81)
RBC: 2.78 MIL/uL — ABNORMAL LOW (ref 4.22–5.81)
RDW: 14.3 % (ref 11.5–15.5)
RDW: 15.4 % (ref 11.5–15.5)
WBC: 10.3 10*3/uL (ref 4.0–10.5)
WBC: 13.2 10*3/uL — AB (ref 4.0–10.5)

## 2017-08-12 LAB — PHOSPHORUS
PHOSPHORUS: 2.3 mg/dL — AB (ref 2.5–4.6)
Phosphorus: 2.9 mg/dL (ref 2.5–4.6)

## 2017-08-12 LAB — COMPREHENSIVE METABOLIC PANEL
ALBUMIN: 1.5 g/dL — AB (ref 3.5–5.0)
ALK PHOS: 86 U/L (ref 38–126)
ALK PHOS: 126 U/L (ref 38–126)
ALT: 27 U/L (ref 17–63)
ALT: 38 U/L (ref 17–63)
AST: 50 U/L — ABNORMAL HIGH (ref 15–41)
AST: 49 U/L — AB (ref 15–41)
Albumin: 1.6 g/dL — ABNORMAL LOW (ref 3.5–5.0)
Anion gap: 1 — ABNORMAL LOW (ref 5–15)
Anion gap: 0 — ABNORMAL LOW (ref 5–15)
BILIRUBIN TOTAL: 0.7 mg/dL (ref 0.3–1.2)
BILIRUBIN TOTAL: 2.3 mg/dL — AB (ref 0.3–1.2)
BUN: 49 mg/dL — AB (ref 6–20)
BUN: 52 mg/dL — AB (ref 6–20)
CALCIUM: 7.1 mg/dL — AB (ref 8.9–10.3)
CALCIUM: 8.4 mg/dL — AB (ref 8.9–10.3)
CO2: 19 mmol/L — AB (ref 22–32)
CO2: 21 mmol/L — ABNORMAL LOW (ref 22–32)
CREATININE: 2.26 mg/dL — AB (ref 0.61–1.24)
CREATININE: 2.41 mg/dL — AB (ref 0.61–1.24)
Chloride: 105 mmol/L (ref 101–111)
Chloride: 121 mmol/L — ABNORMAL HIGH (ref 101–111)
GFR calc Af Amer: 34 mL/min — ABNORMAL LOW (ref >60)
GFR calc Af Amer: 31 mL/min — ABNORMAL LOW (ref >60)
GFR calc non Af Amer: 29 mL/min — ABNORMAL LOW (ref >60)
GFR, EST NON AFRICAN AMERICAN: 27 mL/min — AB (ref >60)
GLUCOSE: 1107 mg/dL — AB (ref 65–99)
Glucose, Bld: 126 mg/dL — ABNORMAL HIGH (ref 65–99)
Potassium: 3.9 mmol/L (ref 3.5–5.1)
Potassium: 3.7 mmol/L (ref 3.5–5.1)
SODIUM: 125 mmol/L — AB (ref 135–145)
Sodium: 142 mmol/L (ref 135–145)
TOTAL PROTEIN: 5.5 g/dL — AB (ref 6.5–8.1)
TOTAL PROTEIN: 7.1 g/dL (ref 6.5–8.1)

## 2017-08-12 LAB — TYPE AND SCREEN
ABO/RH(D): O POS
ANTIBODY SCREEN: NEGATIVE
UNIT DIVISION: 0
Unit division: 0

## 2017-08-12 LAB — BPAM RBC
BLOOD PRODUCT EXPIRATION DATE: 201901162359
Blood Product Expiration Date: 201901142359
ISSUE DATE / TIME: 201812221008
ISSUE DATE / TIME: 201812221318
UNIT TYPE AND RH: 5100
Unit Type and Rh: 5100

## 2017-08-12 LAB — GLUCOSE, CAPILLARY
GLUCOSE-CAPILLARY: 124 mg/dL — AB (ref 65–99)
Glucose-Capillary: 106 mg/dL — ABNORMAL HIGH (ref 65–99)
Glucose-Capillary: 109 mg/dL — ABNORMAL HIGH (ref 65–99)
Glucose-Capillary: 111 mg/dL — ABNORMAL HIGH (ref 65–99)
Glucose-Capillary: 111 mg/dL — ABNORMAL HIGH (ref 65–99)
Glucose-Capillary: 116 mg/dL — ABNORMAL HIGH (ref 65–99)
Glucose-Capillary: 117 mg/dL — ABNORMAL HIGH (ref 65–99)
Glucose-Capillary: 119 mg/dL — ABNORMAL HIGH (ref 65–99)

## 2017-08-12 LAB — MAGNESIUM
Magnesium: 2.9 mg/dL — ABNORMAL HIGH (ref 1.7–2.4)
Magnesium: 2.3 mg/dL (ref 1.7–2.4)

## 2017-08-12 LAB — PREALBUMIN
PREALBUMIN: 15.3 mg/dL — AB (ref 18–38)
PREALBUMIN: 14.8 mg/dL — AB (ref 18–38)

## 2017-08-12 LAB — TRIGLYCERIDES: Triglycerides: 240 mg/dL — ABNORMAL HIGH (ref <150)

## 2017-08-12 MED ORDER — INSULIN ASPART 100 UNIT/ML ~~LOC~~ SOLN
0.0000 [IU] | Freq: Four times a day (QID) | SUBCUTANEOUS | Status: DC
  Administered 2017-08-12 – 2017-08-18 (×14): 2 [IU] via SUBCUTANEOUS

## 2017-08-12 MED ORDER — FAT EMULSION 20 % IV EMUL
180.0000 mL | INTRAVENOUS | Status: AC
  Administered 2017-08-12: 180 mL via INTRAVENOUS
  Filled 2017-08-12: qty 250

## 2017-08-12 MED ORDER — ORAL CARE MOUTH RINSE
15.0000 mL | Freq: Two times a day (BID) | OROMUCOSAL | Status: DC
  Administered 2017-08-13 – 2017-08-17 (×7): 15 mL via OROMUCOSAL

## 2017-08-12 MED ORDER — TRACE MINERALS CR-CU-MN-SE-ZN 10-1000-500-60 MCG/ML IV SOLN
INTRAVENOUS | Status: AC
  Administered 2017-08-12: 17:00:00 via INTRAVENOUS
  Filled 2017-08-12: qty 2000

## 2017-08-12 MED ORDER — FENTANYL CITRATE (PF) 100 MCG/2ML IJ SOLN
50.0000 ug | INTRAMUSCULAR | Status: DC | PRN
  Administered 2017-08-12 – 2017-08-13 (×2): 100 ug via INTRAVENOUS
  Filled 2017-08-12 (×3): qty 2

## 2017-08-12 NOTE — Progress Notes (Signed)
Date: August 12, 2017 Marcelle SmilingRhonda Finnegan Gatta, BSN, [place]3, [place]CCM [phone] Chart and notes review for patient progress and needs. On full vent support Will follow for case management and discharge needs. Next review date: [id number]

## 2017-08-12 NOTE — Procedures (Signed)
Extubation Procedure Note  Patient Details:   Name: Lamar LaundryFrederick Grinage DOB: 08-24-1955 MRN: [id number]   Airway Documentation:     Evaluation  O2 sats: stable throughout Complications: No apparent complications Patient did tolerate procedure well. Bilateral Breath Sounds: Rhonchi   Yes  Renold GentaDunlap, Richell Corker Lawson 08/12/2017, 10:19 AM

## 2017-08-12 NOTE — Progress Notes (Signed)
12 Days Post-Op   Subjective/Chief Complaint: On vent sedated      Objective: Vital signs in last 24 hours: Temp:  [99.9 F (37.7 C)-101.7 F (38.7 C)] 100.6 F (38.1 C) (12/24 0600) Pulse Rate:  [64-95] 79 (12/24 0600) Resp:  [16-29] 21 (12/24 0600) BP: (134-169)/(67-93) 150/67 (12/24 0600) SpO2:  [100 %] 100 % (12/24 0600) FiO2 (%):  [30 %] 30 % (12/24 0400) Weight:  [81.6 kg (179 lb 14.3 oz)] 81.6 kg (179 lb 14.3 oz) (12/24 0359) Last BM Date: 08/11/17  Intake/Output from previous day: 12/23 0701 - 12/24 0700 In: 4685 [I.V.:4225; IV Piggyback:460] Out: 3470 [Urine:3275; Drains:95; Stool:100] Intake/Output this shift: No intake/output data recorded.  Incision/Wound:wound open dehisced and with fibrinous exudate  Left drain purulent   Right drains serous  Ostomy viable and functioning   Lab Results:  Recent Labs    08/11/17 0337 08/12/17 0354  WBC 11.4* 10.3  HGB 7.8* 13.3  HCT 23.4* 38.3*  PLT 378 261   BMET Recent Labs    08/11/17 0337 08/11/17 1732 08/12/17 0354  NA 144  --  125*  K 5.6* 4.1 3.9  CL 118*  --  105  CO2 22  --  19*  GLUCOSE 125*  --  1,107*  BUN 61*  --  49*  CREATININE 2.41*  --  2.26*  CALCIUM 5.3*  --  7.1*   PT/INR No results for input(s): LABPROT, INR in the last 72 hours. ABG Recent Labs    08/09/17 1056  PHART 7.478*  HCO3 25.1    Studies/Results: Dg Abd Portable 1v  Result Date: 08/10/2017 CLINICAL DATA:  61 year old male status post nasogastric tube placement EXAM: PORTABLE ABDOMEN - 1 VIEW COMPARISON:  CT scan of the abdomen and pelvis 08/08/2017 FINDINGS: A nasogastric tube is been placed. The tip overlies the stomach. The proximal side hole is in the region of the gastroesophageal junction. The bowel gas pattern is not obstructed. Patchy airspace opacity in the left lower lobe consistent with known effusion and atelectasis. Left upper quadrant pigtail drainage catheter. IMPRESSION: 1. The proximal side hole of the  gastric tube overlies the gastroesophageal junction. Recommend advancing 3-5 cm for more optimal placement. 2. Left-sided pleural effusion and associated atelectasis versus infiltrate. Electronically Signed   By: Malachy Moan M.D.   On: 08/10/2017 15:25    Anti-infectives: Anti-infectives (From admission, onward)   Start     Dose/Rate Route Frequency Ordered Stop   08/10/17 0500  vancomycin (VANCOCIN) 1,500 mg in sodium chloride 0.9 % 500 mL IVPB     1,500 mg 250 mL/hr over 120 Minutes Intravenous Every 48 hours 08/08/17 0728     08/08/17 2200  anidulafungin (ERAXIS) 100 mg in sodium chloride 0.9 % 100 mL IVPB     100 mg 78 mL/hr over 100 Minutes Intravenous Every 24 hours 08/08/17 0524     08/08/17 0445  vancomycin (VANCOCIN) 1,500 mg in sodium chloride 0.9 % 500 mL IVPB     1,500 mg 250 mL/hr over 120 Minutes Intravenous  Once 08/08/17 0431 08/08/17 0659   08/08/17 0430  vancomycin (VANCOCIN) 1,250 mg in sodium chloride 0.9 % 250 mL IVPB  Status:  Discontinued     1,250 mg 166.7 mL/hr over 90 Minutes Intravenous  Once 08/08/17 0418 08/08/17 0428   08/08/17 0415  anidulafungin (ERAXIS) 200 mg in sodium chloride 0.9 % 200 mL IVPB     200 mg 78 mL/hr over 200 Minutes Intravenous  Once 08/08/17  0413 08/08/17 0816   08/05/17 1000  piperacillin-tazobactam (ZOSYN) IVPB 3.375 g     3.375 g 12.5 mL/hr over 240 Minutes Intravenous Every 8 hours 08/05/17 0946     08/02/17 1400  meropenem (MERREM) 1 g in sodium chloride 0.9 % 100 mL IVPB  Status:  Discontinued     1 g 200 mL/hr over 30 Minutes Intravenous Every 12 hours 08/02/17 1111 08/05/17 0945   07/29/17 0600  piperacillin-tazobactam (ZOSYN) IVPB 3.375 g  Status:  Discontinued     3.375 g 12.5 mL/hr over 240 Minutes Intravenous Every 8 hours 07/29/17 0518 08/02/17 1036   07/25/17 2000  vancomycin (VANCOCIN) IVPB 1000 mg/200 mL premix  Status:  Discontinued     1,000 mg 200 mL/hr over 60 Minutes Intravenous Every 48 hours 07/23/17 2042  07/24/17 0956   07/24/17 2200  piperacillin-tazobactam (ZOSYN) 3.375 g in dextrose 5 % 50 mL IVPB  Status:  Discontinued     3.375 g 100 mL/hr over 30 Minutes Intravenous Every 6 hours 07/24/17 2006 07/29/17 0518   07/24/17 2100  anidulafungin (ERAXIS) 100 mg in sodium chloride 0.9 % 100 mL IVPB  Status:  Discontinued     100 mg 78 mL/hr over 100 Minutes Intravenous Every 24 hours 07/23/17 2039 08/05/17 0945   07/24/17 0000  anidulafungin (ERAXIS) 100 mg in sodium chloride 0.9 % 100 mL IVPB  Status:  Discontinued     100 mg 78 mL/hr over 100 Minutes Intravenous Every 24 hours 07/23/17 2038 07/23/17 2039   07/23/17 2200  piperacillin-tazobactam (ZOSYN) IVPB 3.375 g  Status:  Discontinued     3.375 g 12.5 mL/hr over 240 Minutes Intravenous Every 8 hours 07/23/17 2043 07/24/17 2006   07/23/17 2100  anidulafungin (ERAXIS) 200 mg in sodium chloride 0.9 % 200 mL IVPB     200 mg 78 mL/hr over 200 Minutes Intravenous  Once 07/23/17 2038 07/24/17 0239   07/23/17 2100  vancomycin (VANCOCIN) 1,500 mg in sodium chloride 0.9 % 500 mL IVPB     1,500 mg 250 mL/hr over 120 Minutes Intravenous  Once 07/23/17 2041 07/23/17 2317   07/23/17 1515  piperacillin-tazobactam (ZOSYN) IVPB 3.375 g     3.375 g 100 mL/hr over 30 Minutes Intravenous  Once 07/23/17 1511 07/23/17 1611      Assessment/Plan: Perforated distal small boweland ischemia of cecum 1.S/Pexploratory laparotomy, small bowel resection without anastomosis, placement of open wound VAC system 07/23/17 Dr. Gaynelle AduEric Wilson 2. S/p Reexploration of abdomen, ileocecectomy, creation of end ileostomy,closure of abdominal wall, 07/25/17, Dr. Almond LintFaera Byerly 3. Exploratory laparotomy, drainage of abdominal abscess and evacuation of pelvic hematoma, RUQ drain, and left lateral drain placement, 07/31/17, Dr. Glenna FellowsBenjamin Hoxworth (Findings: Subdiaphragmatic and subhepatic abscesses.Large organizing pelvic hematoma. Apparent necrotic tissue left lobe of the  liver.) Sepsis/shock -resolved, wbc decreasing Bacteremia - Enterobacteriaceae + E Coli, on abx per ccm, awaiting additional drain cultures Cardiac arrest - Code Blue 08/08/17 - reintubated/pressors/increase of antibiotics Acute respiratory failure -remains on ventper ccm Acute renal failure-CRRT discontinued 07/27/17 - creatinine continues to decrease Malnutrition/Decondtioning - Prealbumin <5last time checked, on tpn Anemia - transfused 1 Unit PRBC12/8/18,07/29/17, and12/11/18 - 2 PRBC, got another unit of blood yesterday and I think has appropriate hct this am, would follow GI-needs continued dressing changes, wound is dehisced but not much more to do is certainly at risk, continue drains will need repeat CT Thursday  ZO:[id number]:Vancomycin 07/23/17- 07/24/17,restarted 12/20 day 1,Zosyn 07/23/17- 12/14,anidulafungin 07/23/17 - 12/17,restarted 12/20 day 1,Meropenem 12/14 - 12/17 =>>Zosyn  restarted 12/17 =>>day5anidulafunginrestarted 12/20 =>>day 2 Vancomycin restarted 12/20 =>>day 2 WUJ:WJXBVT:SCDs - Heparin Follow-up:Dr. Gaynelle AduEric Wilson      LOS: 20 days    Caleb Singh 08/12/2017

## 2017-08-12 NOTE — Evaluation (Signed)
Physical Therapy Re-Evaluation Patient Details Name: Caleb Singh MRN: [id number] DOB: 1955-12-07 Today's Date: 08/12/2017   History of Present Illness  61 yo male admitted with intestinal perforation. S/P resection/ileostomy 12/5+12/6. S/P abscess draining 12/12. VDRF. A fib with RVR. ETT 12/20 >> 12/24.  Hx of ETOH abuse, PE.   Clinical Impression  Pt currently with functional limitations due to the deficits listed below (see PT Problem List).  Pt will benefit from skilled PT to increase their independence and safety with mobility to allow discharge to the venue listed below.  Pt assisted OOB to recliner with RN assisting therapist.  Pt very eager to start mobilizing (has not been out of bed in approx one week - intubated).  Pt presents with generalized weakness and requires increased assist for mobility at this time.  Recommend SNF upon d/c.   Follow Up Recommendations SNF    Equipment Recommendations  Other (comment)(per next venue)    Recommendations for Other Services       Precautions / Restrictions Precautions Precautions: Fall Precaution Comments: monitor vitals, 2 R JP drains, 1 L JP drain      Mobility  Bed Mobility Overal bed mobility: Needs Assistance Bed Mobility: Supine to Sit     Supine to sit: Max assist;+2 for safety/equipment     General bed mobility comments: verbal cues for sequence, assist for lower extremities over EOB and trunk upright, RN room and assisted with mobility for safety  Transfers Overall transfer level: Needs assistance Equipment used: 2 person hand held assist Transfers: Sit to/from Stand;Stand Pivot Transfers Sit to Stand: From elevated surface;+2 physical assistance;+2 safety/equipment;Mod assist Stand pivot transfers: Max assist;+2 physical assistance;+2 safety/equipment       General transfer comment: assist to rise, steady and control descent, very unsteady, has not been standing for approx one week, SpO2 100% on room  air  Ambulation/Gait             General Gait Details: NT-too weak to safely attempt  Stairs            Wheelchair Mobility    Modified Rankin (Stroke Patients Only)       Balance Overall balance assessment: Needs assistance Sitting-balance support: Bilateral upper extremity supported;Feet supported Sitting balance-Leahy Scale: Poor Sitting balance - Comments: requires UE support, able to maintain upright posture briefly without external assist however less then 10 sec                                     Pertinent Vitals/Pain Pain Assessment: Faces Faces Pain Scale: Hurts little more Pain Location: abdomen with movement Pain Descriptors / Indicators: Sore Pain Intervention(s): Limited activity within patient's tolerance;Repositioned;Monitored during session    Home Living Family/patient expects to be discharged to:: Private residence Living Arrangements: Alone   Type of Home: Apartment Home Access: Stairs to enter Entrance Stairs-Rails: Right Entrance Stairs-Number of Steps: 1 flight Home Layout: One level Home Equipment: None      Prior Function Level of Independence: Independent               Hand Dominance        Extremity/Trunk Assessment   Upper Extremity Assessment Upper Extremity Assessment: Generalized weakness    Lower Extremity Assessment Lower Extremity Assessment: Generalized weakness RLE Deficits / Details: grossly 3/5 RLE Coordination: decreased gross motor;decreased fine motor LLE Deficits / Details: grossly 3/5 LLE Coordination: decreased gross motor;decreased fine motor  Communication   Communication: No difficulties  Cognition Arousal/Alertness: Awake/alert Behavior During Therapy: WFL for tasks assessed/performed Overall Cognitive Status: Within Functional Limits for tasks assessed                                        General Comments      Exercises      Assessment/Plan    PT Assessment Patient needs continued PT services  PT Problem List Decreased strength;Decreased mobility;Decreased coordination;Decreased activity tolerance;Decreased balance;Decreased knowledge of use of DME       PT Treatment Interventions DME instruction;Gait training;Functional mobility training;Balance training;Patient/family education;Therapeutic activities;Therapeutic exercise;Neuromuscular re-education    PT Goals (Current goals can be found in the Care Plan section)  Acute Rehab PT Goals PT Goal Formulation: With patient Time For Goal Achievement: 08/26/17 Potential to Achieve Goals: Good    Frequency Min 3X/week   Barriers to discharge        Co-evaluation               AM-PAC PT "6 Clicks" Daily Activity  Outcome Measure Difficulty turning over in bed (including adjusting bedclothes, sheets and blankets)?: A Lot Difficulty moving from lying on back to sitting on the side of the bed? : Unable Difficulty sitting down on and standing up from a chair with arms (e.g., wheelchair, bedside commode, etc,.)?: Unable Help needed moving to and from a bed to chair (including a wheelchair)?: A Lot Help needed walking in hospital room?: Total Help needed climbing 3-5 steps with a railing? : Total 6 Click Score: 8    End of Session   Activity Tolerance: Patient limited by fatigue Patient left: in chair;with call bell/phone within reach Nurse Communication: Mobility status;Need for lift equipment(RN assisted with transfer, lift pad in chair for back to bed) PT Visit Diagnosis: Muscle weakness (generalized) (M62.81);Other abnormalities of gait and mobility (R26.89)    Time: 1345-1402 PT Time Calculation (min) (ACUTE ONLY): 17 min   Charges:   PT Evaluation $PT Re-evaluation: 1 Re-eval     PT G Codes:        Caleb Singh, PT, DPT 08/12/2017 Pager: [phone]  Caleb Singh,Caleb E 08/12/2017, 2:16 PM

## 2017-08-12 NOTE — Progress Notes (Signed)
Palliative Medicine consult noted. Due to high referral volume, there may be a delay seeing this patient. Please call the Palliative Medicine Team office at [phone] if recommendations are needed in the interim.  Thank you for inviting us to see this patient.  Margret ChanceMelanie G. Happy [place], RN, BSN, [facility] 08/12/2017 10:14 AM Office [phone]

## 2017-08-12 NOTE — Progress Notes (Signed)
[facility] P[facility]M PROGRESS NOTE  Date of Admission: 07/23/2017 Date of Consult: 07/23/2017 Referring Provider: Dr. Andrey CampanileWilson, CCS Chief Complaint: Abdominal pain  Summary: 61 yo male former smoker presented with severe abdominal pain and found to have free air in abdomen from perforated SB and ischemic cecum.  S/p resection of terminal ileum.  Required re-look for drainage of abdominal abscesses and evacuation of pelvic hematoma.  Past Medical History: ETOH, PE  Subjective: Denies chest pain.  Tolerating SBT.  Vital signs: BP (!) 145/65   Pulse 79   Temp (!) 100.4 F (38 C)   Resp 18   Ht 5\' 10"  (1.778 m)   Wt 179 lb 14.3 oz (81.6 kg)   SpO2 100%   BMI 25.81 kg/m   Intake/Output: I/O last 3 completed shifts: In: 6805 [I.V.:6345; IV Piggyback:460] Out: 1610 [RUEAV:[id number] [Urine:4675; Drains:155; Stool:250]  Physical Exam:  General - alert Eyes - pupils reactive ENT - ETT, OG in place Cardiac - regular, no murmur Chest - no wheeze, rales Abd - wound dressing clean, ostomy and drains in place Ext - no edema Skin - no rashes Neuro - follows commands  Discussion: 61 yo male with SB perforation, cecal ischemia complicated by sepsis with peritonitis, E coli bacteremia, multiple abdominal abscess with E coli, and acute renal failure.  Not candidate for further surgical intervention.  Assessment/Plan:  Acute hypoxic respiratory failure after cardiac arrest 12/19 - extubate 12/24  Peritonitis 2nd to SB perforation and cecal ischemia. Severe protein calorie malnutrition. - post op care, nutrition per CCS - not a candidate for further surgical intervention  Sepsis from peritonitis, E coli bacteremia, multiple abdominal abscesses with E coli. - continue zosyn - d/c vancomycin, eraxis 12/24  A fib with RVR. Hypertension. - monitor hemodynamics  Acute renal failure from ATN. - monitor renal function  Anemia of critical illness. - f/u CBC  Hyperglycemia. - SS  DVT prophylaxis: SQ  heparin SUP: Protonix Diet: TPN Goals of care: Full code.  Sister updated 12/20.  Pt has 2 daughters, but they don't have close relationship.  Will ask palliative care team to assist with discussions about goals of care.  CC time 33 minutes  Coralyn HellingVineet Sayge Brienza, MD [facility] 08/12/2017, 10:24 AM Pager:  [phone] After 7pm call: [phone]  FLOW SHEET  Cultures: Blood 12/04: E coli Blood 12/10: negative Surgical wound 12/12:  Pan sensitive E. Coli Surgical drain 12/20: E coli  Antibiotics: Vancomycin 12/04 >> 12/04 Zosyn 12/04 >> 12/13 Anidulafungin 12/04 >> 12/17 Meropenem 12/14 >> 12/17 Zosyn 12/17 >>  Anidulafungin 12/19 >> 12/24  Vanco 12/19 >> 12/24  Studies: CT abd/pelvis 12/04 >> free air, SB pneumatosis, b/l inguinal hernias, b/l renal cysts Echo 12/05 >> EF 45 to 50%, mild LVH, grade 1 DD CT abd/pelvis 12/12 >> extraluminal contrast, ascites, abscess Lt hepatic lobe area, RLL consolidation, b/l effusions CT chest/abd/pelvis 12/19 >> pelvic hematoma liquefaction, perisplenic abscess, atelectasis  Events: 12/04 Admit 12/05 Start CRRT 12/06 Ileostomy, closure of abdominal wound 12/08 Off pressors 12/09 Off CRRT 12/12 Laparotomy for abscess, evacuation of hematoma  12/16 Off precedex  12/18 Bicarb added for NG acidosis, tx 1 unit PRBC 12/19 Decompensated, PEA arrest > intubated 12/20 IR drainage of LUQ abscess  Lines/tubes: Lt [place] CVL 12/04 >> 12/16 ETT 12/04 >> 12/04 RUE PICC 12/16 >>  ETT 12/20 >> 12/24  Consults: 12/05 Renal, s/o 12/12 12/19 Wound care 12/20 IR   Resolved Problems: Septic shock, PEA cardiac arrest  Labs: CMP Latest Ref Rng &  Units 08/12/2017 08/11/2017 08/11/2017  Glucose 65 - 99 mg/dL 1,914(NW1,107(HH) - 295(A125(H)  BUN 6 - 20 mg/dL 21(H49(H) - 08(M61(H)  Creatinine 0.61 - 1.24 mg/dL 5.78(I2.26(H) - 6.96(E2.41(H)  Sodium 135 - 145 mmol/L 125(L) - 144  Potassium 3.5 - 5.1 mmol/L 3.9 4.1 5.6(H)  Chloride 101 - 111 mmol/L 105 - 118(H)  CO2 22  - 32 mmol/L 19(L) - 22  Calcium 8.9 - 10.3 mg/dL 7.1(L) - 5.3(LL)  Total Protein 6.5 - 8.1 g/dL 9.5(M5.5(L) - -  Total Bilirubin 0.3 - 1.2 mg/dL 0.7 - -  Alkaline Phos 38 - 126 U/L 86 - -  AST 15 - 41 U/L 50(H) - -  ALT 17 - 63 U/L 27 - -    CBC Latest Ref Rng & Units 08/12/2017 08/11/2017 08/10/2017  WBC 4.0 - 10.5 K/uL 10.3 11.4(H) 12.2(H)  Hemoglobin 13.0 - 17.0 g/dL 84.113.3 3.2(G7.8(L) 4.0(N8.5(L)  Hematocrit 39.0 - 52.0 % 38.3(L) 23.4(L) 26.5(L)  Platelets 150 - 400 K/uL 261 378 385    CBG (last 3)  Recent Labs    08/12/17 0341 08/12/17 0537 08/12/17 0740  GLUCAP 106* 119* 116*    Imaging: Dg Abd Portable 1v  Result Date: 08/10/2017 CLINICAL DATA:  61 year old male status post nasogastric tube placement EXAM: PORTABLE ABDOMEN - 1 VIEW COMPARISON:  CT scan of the abdomen and pelvis 08/08/2017 FINDINGS: A nasogastric tube is been placed. The tip overlies the stomach. The proximal side hole is in the region of the gastroesophageal junction. The bowel gas pattern is not obstructed. Patchy airspace opacity in the left lower lobe consistent with known effusion and atelectasis. Left upper quadrant pigtail drainage catheter. IMPRESSION: 1. The proximal side hole of the gastric tube overlies the gastroesophageal junction. Recommend advancing 3-5 cm for more optimal placement. 2. Left-sided pleural effusion and associated atelectasis versus infiltrate. Electronically Signed   By: Malachy MoanHeath  McCullough M.D.   On: 08/10/2017 15:25

## 2017-08-12 NOTE — Progress Notes (Signed)
Referring Physician(s): Byrum,R  Supervising Physician: Ruel FavorsShick, Trevor  [facility]:  [facility]  Chief Complaint: Left abdominal abscess   Subjective: Patient extubated, still with abdominal soreness   Allergies: Patient has no known allergies.  Medications: Prior to Admission medications   Medication Sig Start Date End Date Taking? Authorizing Provider  clindamycin (CLEOCIN) 300 MG capsule Take 1 capsule (300 mg total) by mouth 3 (three) times daily. Patient not taking: Reported on 07/23/2017 04/21/16   Dominica SeverinGramig, William, MD  oxyCODONE (OXY IR/ROXICODONE) 5 MG immediate release tablet Take 1-2 tablets (5-10 mg total) by mouth every 3 (three) hours as needed for moderate pain. Patient not taking: Reported on 07/23/2017 04/21/16   Dominica SeverinGramig, William, MD     Vital Signs: BP (!) 149/75   Pulse 86   Temp (!) 100.8 F (38.2 C)   Resp (!) 22   Ht 5\' 10"  (1.778 m)   Wt 179 lb 14.3 oz (81.6 kg)   SpO2 100%   BMI 25.81 kg/m   Physical Exam left upper quadrant drain intact, insertion site okay, output 100 cc of purulent beige colored fluid; cultures growing E. Coli; abd dist Imaging: Dg Chest Port 1 View  Result Date: 08/10/2017 CLINICAL DATA:  Respiratory failure. Former smoker. Follow-up exam. EXAM: PORTABLE CHEST 1 VIEW COMPARISON:  08/09/2017 FINDINGS: There is persistent lung base opacity consistent with atelectasis. Remainder of the lungs is clear. Endotracheal tube and right PICC are stable in well positioned. Suspect small pleural effusions.  No pneumothorax. IMPRESSION: 1. No change from the previous day's study. 2. Persistent basilar atelectasis. 3. Support apparatus is stable and well positioned. Electronically Signed   By: Amie Portlandavid  Ormond M.D.   On: 08/10/2017 07:04   Dg Chest Port 1 View  Result Date: 08/09/2017 CLINICAL DATA:  Respiratory failure. EXAM: PORTABLE CHEST 1 VIEW COMPARISON:  Radiograph of August 08, 2017. FINDINGS: Stable cardiomediastinal silhouette.  Endotracheal tube is in grossly good position. Right-sided PICC line is unchanged in position. No pneumothorax is noted. Mild bibasilar subsegmental atelectasis is noted with probable minimal pleural effusions. Bony thorax is unremarkable. IMPRESSION: Endotracheal tube in grossly good position. Stable bibasilar subsegmental atelectasis with minimal pleural effusions. Electronically Signed   By: Lupita RaiderJames  Green Jr, M.D.   On: 08/09/2017 08:50   Dg Abd Portable 1v  Result Date: 08/10/2017 CLINICAL DATA:  61 year old male status post nasogastric tube placement EXAM: PORTABLE ABDOMEN - 1 VIEW COMPARISON:  CT scan of the abdomen and pelvis 08/08/2017 FINDINGS: A nasogastric tube is been placed. The tip overlies the stomach. The proximal side hole is in the region of the gastroesophageal junction. The bowel gas pattern is not obstructed. Patchy airspace opacity in the left lower lobe consistent with known effusion and atelectasis. Left upper quadrant pigtail drainage catheter. IMPRESSION: 1. The proximal side hole of the gastric tube overlies the gastroesophageal junction. Recommend advancing 3-5 cm for more optimal placement. 2. Left-sided pleural effusion and associated atelectasis versus infiltrate. Electronically Signed   By: Malachy MoanHeath  McCullough M.D.   On: 08/10/2017 15:25   Ct Image Guided Drainage By Percutaneous Catheter  Result Date: 08/08/2017 INDICATION: History of small bowel perforation, cecal ischemia and hemoperitoneum with multiple recent intra-abdominal surgery is including creation of an ostomy in wound VAC placement. Patient remains critically ill now with persistent fluid collection within the left upper abdominal quadrant. Please perform percutaneous drainage catheter placement for infection source control purposes. EXAM: CT IMAGE GUIDED DRAINAGE BY PERCUTANEOUS CATHETER COMPARISON:  CT  chest, abdomen and pelvis - 08/07/2017 MEDICATIONS: The patient is currently admitted to the hospital and  receiving intravenous antibiotics. The antibiotics were administered within an appropriate time frame prior to the initiation of the procedure. ANESTHESIA/SEDATION: Moderate (conscious) sedation was employed during this procedure. A total of Versed 1 mg and Fentanyl 50 mcg was administered intravenously. Moderate Sedation Time: 14 minutes. The patient's level of consciousness and vital signs were monitored continuously by radiology nursing throughout the procedure under my direct supervision. CONTRAST:  None COMPLICATIONS: None immediate. PROCEDURE: Informed written consent was obtained from the patient's family after a discussion of the risks, benefits and alternatives to treatment. The patient was placed prone on the CT gantry and a pre procedural CT was performed re-demonstrating the known abscess/fluid collection within the left upper abdominal quadrant adjacent to the spleen with dominant lenticular component measuring approximately 12.0 x 4.1 cm (image 20, series 2). The procedure was planned. A timeout was performed prior to the initiation of the procedure. The skin overlying the left upper abdomen was prepped and draped in the usual sterile fashion. The overlying soft tissues were anesthetized with 1% lidocaine with epinephrine. Appropriate trajectory was planned with the use of a 22 gauge spinal needle. An 18 gauge trocar needle was advanced into the abscess/fluid collection and a short Amplatz super stiff wire was coiled within the collection. Appropriate positioning was confirmed with a limited CT scan. The tract was serially dilated allowing placement of a 10 [place]French all-purpose drainage catheter. Appropriate positioning was confirmed with a limited postprocedural CT scan. Approximately 50 cc of purulent fluid was aspirated. The tube was connected to a JP bulb and sutured in place. A dressing was placed. The patient tolerated the procedure well without immediate post procedural complication. IMPRESSION:  Successful CT guided placement of a 10 French all purpose drain catheter into the left upper abdominal with aspiration of 50 cc of purulent fluid. Samples were sent to the laboratory as requested by the ordering clinical team. Electronically Signed   By: Simonne ComeJohn  Watts M.D.   On: 08/08/2017 18:18    Labs:  CBC: Recent Labs    08/10/17 0516 08/10/17 1604 08/11/17 0337 08/12/17 0354  WBC 10.4 12.2* 11.4* 10.3  HGB 5.6* 8.5* 7.8* 13.3  HCT 17.4* 26.5* 23.4* 38.3*  PLT 392 385 378 261    COAGS: Recent Labs    07/23/17 1455 07/24/17 0350 07/31/17 0745 08/08/17 1329  INR 1.78 1.77 1.58 1.45    BMP: Recent Labs    08/09/17 0500 08/10/17 0415 08/11/17 0337 08/11/17 1732 08/12/17 0354  NA 149* 146* 144  --  125*  K 3.0* 3.2* 5.6* 4.1 3.9  CL 119* 118* 118*  --  105  CO2 25 23 22   --  19*  GLUCOSE 140* 124* 125*  --  1,107*  BUN 78* 70* 61*  --  49*  CALCIUM 7.5* 7.6* 5.3*  --  7.1*  CREATININE 2.70* 2.53* 2.41*  --  2.26*  GFRNONAA 24* 25* 27*  --  29*  GFRAA 27* 29* 31*  --  34*    LIVER FUNCTION TESTS: Recent Labs    08/07/17 2211 08/08/17 0145 08/10/17 0415 08/12/17 0354  BILITOT 2.4* 2.0* 2.0* 0.7  AST 36 42* 33 50*  ALT 31 33 26 27  ALKPHOS 105 111 81 86  PROT 7.3 7.7 6.6 5.5*  ALBUMIN 1.9* 2.1* 1.6* 1.5*    Assessment and Plan: Hx SB perf, E coli bacteremia, mult abd abscesses/mult abd  surgeries with ileocecectomy/end ileostomy; s/p LUQ abscess drain 12/20; extubated today; temp 100.8, WBC normal, hemoglobin 13.3, creatinine 2.26 (2.41), left upper quadrant drain fluid cultures growing E. Coli; output still significant; continue drains until output minimal then obtain follow-up CT; additional plans as per critical care service and surgery     Electronically Signed: D. Jeananne Rama, PA-C 08/12/2017, 1:46 PM   I spent a total of 15 minutes at the the patient's bedside AND on the patient's hospital floor or unit, greater than 50% of which was  counseling/coordinating care for left abdominal abscess drain    Patient ID: Caleb Singh, male   DOB: 16-Jun-1956, 61 y.o.   MRN: [id number]

## 2017-08-12 NOTE — Progress Notes (Signed)
Notified Elink RN Gretchin in regards to patient having asystole pause at 2042:40. Patient did present to become altered and unresponsive during this pause. Gretchin RN said that she will notify Elink MD. Morrie SheldonAshley RN at bedside with patient.

## 2017-08-12 NOTE — Progress Notes (Signed)
Glucose value of 1107 @3 :54am is inaccurate. TPN was running when labs were drawn. Capillary glucose is 119 @5 :37am. Labs have been redrawn and results are pending.

## 2017-08-12 NOTE — Progress Notes (Signed)
PHARMACY - ADULT TOTAL PARENTERAL NUTRITION CONSULT NOTE   Pharmacy Consult for TPN Indication: prolonged ileus  Patient Measurements: Body mass index is 25.81 kg/m. Filed Weights   08/11/17 0420 08/11/17 0740 08/12/17 0359  Weight: 185 lb 13.6 oz (84.3 kg) 181 lb 7 oz (82.3 kg) 179 lb 14.3 oz (81.6 kg)    HPI: [name] admitted on 12/4 with c/o severe and worsening abdominal distention, pain, nonbloody, nonbilious, non-coffee-ground emesis and lack of bowel movements x3 days.  No significant PMH.  He was found to have perforated small bowel s/p repair in OR, and remains intubated/sedated on pressors postop.  Pharmacy is consulted to dose TPN.  Significant events:  12/4 SB resection, open wound VAC.  NG tube placed 12/5 Started CRRT 12/6 Repeat OR on for washout, ileocecectomy, creation of end ileostomy,closure of abdominal wall 12/9 CRRT off 12/9 am 12/12 Exploratory laparotomy, drainage of abdominal abscess and evacuation of pelvic hematoma, RUQ drain, and left lateral drain placement. Findings per surgery note: Subdiaphragmatic and subhepatic abscesses.Large organizing pelvic hematoma. Apparent necrotic tissue left lobe of the liver. 12/14 Extubated 12/17 Some drainage through his ileostomy, but no bowel sounds.  No diet, may have ice chips, sips of clears. 12/18 Transfuse PRBC, start bicarb, cont sips of clears only 12/20 Pt unresponsive, code blue, now re-intubated.  TPN was stopped during code.  Additional drain placed by IR.  Insulin requirements past 24 hours: 4 units SSI/24h  Current Nutrition: NPO.  TPN at goal rate  IVF: D5W at 5130ml/hr  Central access: CVC triple lumen placed 12/4 TPN start date: 12/7  ASSESSMENT                                                                                                          Today:   Glucose:  goal < 150 (no hx of DM), CBGs controlled (119-130).  Glucose on Bmet this morning=1107 which does not correlate to CBGs or any  previous draw so expect lab draw error.   Electrolytes: Na, CO2 low today & have been trending down since off NaBicarb. Mag elevated after repleting 12/23, Cl, K, Phos, CorrCa 7.22 WNL.  Electrolytes removed from TPN on 12/17.  NaBicarb infusion started 12/18, stopped 12/21  Renal:  SCr improved to 2.26, (off CRRT 12/9a) s/p resuscitation and addition of Vanc/Zosyn/Eraxis, and pressors.    I/O = +0.5 L/24h; drain output 145 mL (3 drains), stool output 150 mL in ostomy; UOP 3585 mL   Weight~ admission weight (79.4 kg).  Slightly down overnight with reduced IVF rate.  Watch volume status.  LFTs:  WNL except slight increase AST  TGs: 233 (12/5), 400 (12/6), 281 (12/7 - propofol stopped), 322 (12/10), 231 (12/14), 216 (12/17)  Prealbumin:  9.3 (12/5), 9 (12/10), 18.8 (12/17), IP (12/24)  NUTRITIONAL GOALS  RD recs (updated 12/21): 110-126g Protein, 2269 Kcal  Based on published guidelines, while the patient meets ICU status the initiation of lipids is being delayed for 7 days or until transition out of ICU. Planned start date for lipids is 12/14.  Goal will be to meet 100% of the patient's protein needs and approximately 80% of their caloric needs.  Clinimix 5/15 @ 100 mL/hr and lipids 20% at 3515ml/hr over 12h to provide  120 grams protein (100% of goal) and 2064 kcal (91% of goal).  PLAN                                                                                                                          At 1800 today:  Continue Clinimix 5/15 NO electrolyte formula at 100 ml/hr   Started lipids 12/14 (held for first week of ICU status) at 5815ml/hr over 12h (dose reduction due to mildly elevated trigs)  TPN to contain standard multivitamins and trace element daily.  IVF per MD  Continue CBGs q6h checks and SSI moderate scale.   TPN lab panels on Mondays & Thursdays.    Consider cyclic  TPN if prolonged course is needed.  Junita PushMichelle Francois Elk, PharmD, BCPS 08/12/2017 7:36 AM

## 2017-08-13 ENCOUNTER — Encounter (HOSPITAL_COMMUNITY)
Admission: EM | Disposition: A | Discharge status: Skilled Nursing Facility | Payer: Self-pay | Source: Home / Self Care | Attending: Internal Medicine

## 2017-08-13 DIAGNOSIS — R001 Bradycardia, unspecified: Secondary | ICD-10-CM

## 2017-08-13 DIAGNOSIS — I455 Other specified heart block: Secondary | ICD-10-CM

## 2017-08-13 HISTORY — PX: LEFT HEART CATH AND CORONARY ANGIOGRAPHY: CATH118249

## 2017-08-13 LAB — AEROBIC/ANAEROBIC CULTURE W GRAM STAIN (SURGICAL/DEEP WOUND)

## 2017-08-13 LAB — GLUCOSE, CAPILLARY
GLUCOSE-CAPILLARY: 114 mg/dL — AB (ref 65–99)
GLUCOSE-CAPILLARY: 131 mg/dL — AB (ref 65–99)
GLUCOSE-CAPILLARY: 126 mg/dL — AB (ref 65–99)
Glucose-Capillary: 130 mg/dL — ABNORMAL HIGH (ref 65–99)
Glucose-Capillary: 122 mg/dL — ABNORMAL HIGH (ref 65–99)

## 2017-08-13 LAB — CBC
HCT: 24.2 % — ABNORMAL LOW (ref 39.0–52.0)
HEMATOCRIT: 23.9 % — AB (ref 39.0–52.0)
HEMOGLOBIN: 7.6 g/dL — AB (ref 13.0–17.0)
Hemoglobin: 7.7 g/dL — ABNORMAL LOW (ref 13.0–17.0)
MCH: 29.2 pg (ref 26.0–34.0)
MCH: 29 pg (ref 26.0–34.0)
MCHC: 31.8 g/dL (ref 30.0–36.0)
MCHC: 31.8 g/dL (ref 30.0–36.0)
MCV: 91.7 fL (ref 78.0–100.0)
MCV: 91.2 fL (ref 78.0–100.0)
PLATELETS: 473 10*3/uL — AB (ref 150–400)
Platelets: 480 10*3/uL — ABNORMAL HIGH (ref 150–400)
RBC: 2.64 MIL/uL — ABNORMAL LOW (ref 4.22–5.81)
RBC: 2.62 MIL/uL — AB (ref 4.22–5.81)
RDW: 15.4 % (ref 11.5–15.5)
RDW: 15.1 % (ref 11.5–15.5)
WBC: 13.7 10*3/uL — ABNORMAL HIGH (ref 4.0–10.5)
WBC: 13.1 10*3/uL — ABNORMAL HIGH (ref 4.0–10.5)

## 2017-08-13 LAB — TROPONIN I
TROPONIN I: 0.1 ng/mL — AB (ref <0.03)
Troponin I: 0.1 ng/mL (ref <0.03)

## 2017-08-13 LAB — AEROBIC/ANAEROBIC CULTURE (SURGICAL/DEEP WOUND): SPECIAL REQUESTS: NORMAL

## 2017-08-13 LAB — PHOSPHORUS: Phosphorus: 2.5 mg/dL (ref 2.5–4.6)

## 2017-08-13 LAB — CREATININE, SERUM
CREATININE: 2.4 mg/dL — AB (ref 0.61–1.24)
CREATININE: 2.31 mg/dL — AB (ref 0.61–1.24)
GFR calc Af Amer: 33 mL/min — ABNORMAL LOW (ref >60)
GFR calc non Af Amer: 27 mL/min — ABNORMAL LOW (ref >60)
GFR, EST AFRICAN AMERICAN: 31 mL/min — AB (ref >60)
GFR, EST NON AFRICAN AMERICAN: 28 mL/min — AB (ref >60)

## 2017-08-13 LAB — BASIC METABOLIC PANEL
Anion gap: 4 — ABNORMAL LOW (ref 5–15)
BUN: 48 mg/dL — AB (ref 6–20)
CALCIUM: 8.2 mg/dL — AB (ref 8.9–10.3)
CO2: 18 mmol/L — AB (ref 22–32)
CREATININE: 2.27 mg/dL — AB (ref 0.61–1.24)
Chloride: 122 mmol/L — ABNORMAL HIGH (ref 101–111)
GFR calc Af Amer: 34 mL/min — ABNORMAL LOW (ref >60)
GFR, EST NON AFRICAN AMERICAN: 29 mL/min — AB (ref >60)
GLUCOSE: 133 mg/dL — AB (ref 65–99)
Potassium: 3.1 mmol/L — ABNORMAL LOW (ref 3.5–5.1)
Sodium: 144 mmol/L (ref 135–145)

## 2017-08-13 LAB — MAGNESIUM: MAGNESIUM: 2.1 mg/dL (ref 1.7–2.4)

## 2017-08-13 SURGERY — TEMPORARY PACEMAKER INSERTION

## 2017-08-13 MED ORDER — FENTANYL CITRATE (PF) 100 MCG/2ML IJ SOLN
INTRAMUSCULAR | Status: AC
  Filled 2017-08-13: qty 2

## 2017-08-13 MED ORDER — HYDRALAZINE HCL 20 MG/ML IJ SOLN
10.0000 mg | INTRAMUSCULAR | Status: DC | PRN
  Administered 2017-08-13 (×2): 10 mg via INTRAVENOUS
  Filled 2017-08-13 (×2): qty 1

## 2017-08-13 MED ORDER — SODIUM CHLORIDE 0.9% FLUSH: 3.0000 mL | INTRAVENOUS | Status: DC | PRN

## 2017-08-13 MED ORDER — VERAPAMIL HCL 2.5 MG/ML IV SOLN
INTRAVENOUS | Status: DC | PRN
  Administered 2017-08-13: 10 mL via INTRA_ARTERIAL

## 2017-08-13 MED ORDER — HEPARIN SODIUM (PORCINE) 1000 UNIT/ML IJ SOLN
INTRAMUSCULAR | Status: DC | PRN
  Administered 2017-08-13: 3000 [IU] via INTRAVENOUS

## 2017-08-13 MED ORDER — VERAPAMIL HCL 2.5 MG/ML IV SOLN
INTRAVENOUS | Status: AC
  Filled 2017-08-13: qty 2

## 2017-08-13 MED ORDER — SODIUM CHLORIDE 0.9% FLUSH
3.0000 mL | Freq: Two times a day (BID) | INTRAVENOUS | Status: DC
  Administered 2017-08-13 – 2017-08-15 (×5): 3 mL via INTRAVENOUS

## 2017-08-13 MED ORDER — IOPAMIDOL (ISOVUE-370) INJECTION 76%
INTRAVENOUS | Status: DC | PRN
  Administered 2017-08-13: 30 mL via INTRA_ARTERIAL

## 2017-08-13 MED ORDER — HYDRALAZINE HCL 20 MG/ML IJ SOLN
20.0000 mg | INTRAMUSCULAR | Status: DC
  Administered 2017-08-13 – 2017-08-25 (×70): 20 mg via INTRAVENOUS
  Filled 2017-08-13 (×69): qty 1

## 2017-08-13 MED ORDER — HEPARIN SODIUM (PORCINE) 5000 UNIT/ML IJ SOLN
5000.0000 [IU] | Freq: Three times a day (TID) | INTRAMUSCULAR | Status: DC
  Administered 2017-08-13 – 2017-08-16 (×8): 5000 [IU] via SUBCUTANEOUS
  Filled 2017-08-13 (×8): qty 1

## 2017-08-13 MED ORDER — FENTANYL CITRATE (PF) 100 MCG/2ML IJ SOLN
INTRAMUSCULAR | Status: DC | PRN
  Administered 2017-08-13: 25 ug via INTRAVENOUS

## 2017-08-13 MED ORDER — NITROGLYCERIN 0.4 MG SL SUBL
SUBLINGUAL_TABLET | SUBLINGUAL | Status: AC
  Filled 2017-08-13: qty 1

## 2017-08-13 MED ORDER — NITROGLYCERIN IN D5W 200-5 MCG/ML-% IV SOLN
0.0000 ug/min | INTRAVENOUS | Status: DC
  Administered 2017-08-13: 5 ug/min via INTRAVENOUS
  Filled 2017-08-13: qty 250

## 2017-08-13 MED ORDER — SODIUM CHLORIDE 0.9 % IV SOLN: 250.0000 mL | INTRAVENOUS | Status: DC | PRN

## 2017-08-13 MED ORDER — HEPARIN (PORCINE) IN NACL 2-0.9 UNIT/ML-% IJ SOLN
INTRAMUSCULAR | Status: AC | PRN
  Administered 2017-08-13: 1000 mL via INTRA_ARTERIAL

## 2017-08-13 MED ORDER — ONDANSETRON HCL 4 MG/2ML IJ SOLN
4.0000 mg | Freq: Four times a day (QID) | INTRAMUSCULAR | Status: DC | PRN
  Administered 2017-08-16 – 2017-08-23 (×7): 4 mg via INTRAVENOUS
  Filled 2017-08-13 (×7): qty 2

## 2017-08-13 MED ORDER — DOPAMINE-DEXTROSE 3.2-5 MG/ML-% IV SOLN: 0.0000 ug/kg/min | INTRAVENOUS | Status: DC

## 2017-08-13 MED ORDER — MORPHINE SULFATE (PF) 4 MG/ML IV SOLN
INTRAVENOUS | Status: AC
Start: 2017-08-13 — End: 2017-08-13
  Filled 2017-08-13: qty 1

## 2017-08-13 MED ORDER — LIDOCAINE HCL (PF) 1 % IJ SOLN
INTRAMUSCULAR | Status: DC | PRN
  Administered 2017-08-13: 2 mL via INTRADERMAL
  Administered 2017-08-13: 3 mL via INTRADERMAL

## 2017-08-13 MED ORDER — HEPARIN SODIUM (PORCINE) 1000 UNIT/ML IJ SOLN
INTRAMUSCULAR | Status: AC
  Filled 2017-08-13: qty 1

## 2017-08-13 MED ORDER — FAT EMULSION 20 % IV EMUL
180.0000 mL | INTRAVENOUS | Status: AC
  Administered 2017-08-13: 180 mL via INTRAVENOUS
  Filled 2017-08-13: qty 250
  Filled 2017-08-13: qty 200

## 2017-08-13 MED ORDER — LIDOCAINE HCL (PF) 1 % IJ SOLN
INTRAMUSCULAR | Status: AC
  Filled 2017-08-13: qty 30

## 2017-08-13 MED ORDER — HYDRALAZINE HCL 20 MG/ML IJ SOLN
10.0000 mg | INTRAMUSCULAR | Status: DC
  Administered 2017-08-13 (×3): 10 mg via INTRAVENOUS
  Filled 2017-08-13 (×3): qty 1

## 2017-08-13 MED ORDER — IOPAMIDOL (ISOVUE-370) INJECTION 76%
INTRAVENOUS | Status: AC
  Filled 2017-08-13: qty 100

## 2017-08-13 MED ORDER — NITROGLYCERIN 0.4 MG SL SUBL
0.4000 mg | SUBLINGUAL_TABLET | SUBLINGUAL | Status: DC | PRN
  Administered 2017-08-13: 0.4 mg via SUBLINGUAL

## 2017-08-13 MED ORDER — POTASSIUM CHLORIDE 10 MEQ/50ML IV SOLN
10.0000 meq | INTRAVENOUS | Status: AC
  Administered 2017-08-13 (×4): 10 meq via INTRAVENOUS
  Filled 2017-08-13 (×5): qty 50

## 2017-08-13 MED ORDER — MORPHINE SULFATE (PF) 4 MG/ML IV SOLN
2.0000 mg | INTRAVENOUS | Status: DC | PRN
  Administered 2017-08-13 – 2017-08-14 (×3): 4 mg via INTRAVENOUS
  Administered 2017-08-14: 2 mg via INTRAVENOUS
  Administered 2017-08-17 (×2): 4 mg via INTRAVENOUS
  Administered 2017-08-17: 2 mg via INTRAVENOUS
  Administered 2017-08-18 – 2017-08-19 (×3): 4 mg via INTRAVENOUS
  Administered 2017-08-20 (×2): 2 mg via INTRAVENOUS
  Administered 2017-08-21: 4 mg via INTRAVENOUS
  Administered 2017-08-22 – 2017-08-23 (×4): 2 mg via INTRAVENOUS
  Administered 2017-08-24: 4 mg via INTRAVENOUS
  Filled 2017-08-13 (×17): qty 1

## 2017-08-13 MED ORDER — MIDAZOLAM HCL 2 MG/2ML IJ SOLN
INTRAMUSCULAR | Status: DC | PRN
  Administered 2017-08-13: 1 mg via INTRAVENOUS

## 2017-08-13 MED ORDER — ACETAMINOPHEN 325 MG PO TABS: 650.0000 mg | ORAL_TABLET | ORAL | Status: DC | PRN

## 2017-08-13 MED ORDER — HEPARIN (PORCINE) IN NACL 2-0.9 UNIT/ML-% IJ SOLN
INTRAMUSCULAR | Status: AC
  Filled 2017-08-13: qty 500

## 2017-08-13 MED ORDER — M.V.I. ADULT IV INJ
INJECTION | INTRAVENOUS | Status: AC
  Administered 2017-08-13: 18:00:00 via INTRAVENOUS
  Filled 2017-08-13: qty 2400
  Filled 2017-08-13: qty 2000

## 2017-08-13 MED ORDER — MIDAZOLAM HCL 2 MG/2ML IJ SOLN
INTRAMUSCULAR | Status: AC
  Filled 2017-08-13: qty 2

## 2017-08-13 SURGICAL SUPPLY — 13 items
CATH INFINITI 5 FR JL3.5 (CATHETERS) ×2 IMPLANT
CATH INFINITI JR4 5F (CATHETERS) ×2 IMPLANT
CATH S G BIP PACING (SET/KITS/TRAYS/PACK) ×2 IMPLANT
DEVICE RAD COMP TR BAND LRG (VASCULAR PRODUCTS) ×2 IMPLANT
GLIDESHEATH SLEND SS 6F .021 (SHEATH) ×2 IMPLANT
GUIDEWIRE INQWIRE 1.5J.035X260 (WIRE) IMPLANT
INQWIRE 1.5J .035X260CM (WIRE) ×4
KIT HEART LEFT (KITS) ×2 IMPLANT
PACK CARDIAC CATHETERIZATION (CUSTOM PROCEDURE TRAY) ×4 IMPLANT
SHEATH PINNACLE 6F 10CM (SHEATH) ×2 IMPLANT
SLEEVE REPOSITIONING LENGTH 30 (MISCELLANEOUS) ×2 IMPLANT
TRANSDUCER W/STOPCOCK (MISCELLANEOUS) ×2 IMPLANT
TUBING CIL FLEX 10 FLL-RA (TUBING) ×2 IMPLANT

## 2017-08-13 NOTE — H&P (View-Only) (Signed)
CARDIOLOGY CONSULT NOTE  Patient ID: Caleb Singh MRN: [id number] DOB/AGE: 10/03/1955 61 y.o.  Admit date: 07/23/2017 Primary Physician Patient, No Pcp Per Primary Cardiologist None Chief Complaint  Bradycardia Requesting  Dr. Darrick Penna  HPI:   The patient has had an extensive hospital course as below.   He has no cardiac history prior to presentation.   We are called because of episodes of bradycardia.  We are asked to see the patient by Dr. Detterding.  He has been hypertensive and was getting PRN labetalol.  This was changed to hydralazine early this AM.  The patient has also had chest pain.     The patient was admitted on 12/4 with perforated small bowel.  He had E Coli bacteremia.   He had small bowel resection.   He has required CRRT and has had to have exploratory lap for drainage of abscess.  He has also had a pelvic hematoma.  On 12/20 a code blue was called as he had asystole.  He was (re)intubated.  This was preceded by a decline over a few days with continued continued intraperitoneal abscess that required drainage. I also note that he has had intermittent atrial fib reported.   The patient seems to be mildly confused but does not report any prior cardiac history.  In particular he has had no syncope.  I spoke with nursing who was given a report of a 20 second pauses.  However, there is no documentation of this and we could not find this searching tele.  There are several bradycardic episodes with 4 - 5 second pauses.  These seem to be with stimulation such as suction or bathing.  He was able to sit in a chair yesterday without difficulty.  He is able to talk and is awake during these events.  He denies any chest pain or SOB.   History reviewed. No pertinent past medical history.  Past Surgical History:  Procedure Laterality Date  . I&D EXTREMITY Right 04/17/2016   Procedure: IRRIGATION AND DEBRIDEMENT RIGHT HAND;  Surgeon: Dominica Severin, MD;  [facility];  Service:  Orthopedics;  Laterality: Right;  . I&D EXTREMITY Right 04/19/2016   Procedure: REPEAT I&D RIGHT HAND;  Surgeon: Dominica Severin, MD;  Location: [facility] OR;  Service: Orthopedics;  Laterality: Right;  . LAPAROTOMY N/A 07/23/2017   Procedure: EXPLORATORY LAPAROTOMY, SMALL BOWEL RESECTION;  Surgeon: Gaynelle Adu, MD;  Location: [facility] ORS;  Service: General;  Laterality: N/A;  . LAPAROTOMY N/A 07/25/2017   Procedure: EXPLORATORY LAPAROSCOPY WITH ILEOCECTOMY, END ILEOSTOMY;  Surgeon: Almond Lint, MD;  Location: [facility] ORS;  Service: General;  Laterality: N/A;  PATIENT ABDOMINAL WOUND LEFT OPEN AND PACKED WITH BULKY DRESSING  . LAPAROTOMY N/A 07/31/2017   Procedure: EXPLORATORY LAPAROTOMY drainage of abdominal abcess;  Surgeon: Glenna Fellows, MD;  Location: [facility] ORS;  Service: General;  Laterality: N/A;    No Known Allergies Medications Prior to Admission  Medication Sig Dispense Refill Last Dose  . clindamycin (CLEOCIN) 300 MG capsule Take 1 capsule (300 mg total) by mouth 3 (three) times daily. (Patient not taking: Reported on 07/23/2017) 42 capsule 0 Completed Course at Unknown time  . oxyCODONE (OXY IR/ROXICODONE) 5 MG immediate release tablet Take 1-2 tablets (5-10 mg total) by mouth every 3 (three) hours as needed for moderate pain. (Patient not taking: Reported on 07/23/2017) 40 tablet 0 Completed Course at Unknown time   History reviewed. No pertinent family history.  Social History   Socioeconomic History  . Marital status:  Single    Spouse name: Not on file  . Number of children: Not on file  . Years of education: Not on file  . Highest education level: Not on file  Social Needs  . Financial resource strain: Not on file  . Food insecurity - worry: Not on file  . Food insecurity - inability: Not on file  . Transportation needs - medical: Not on file  . Transportation needs - non-medical: Not on file  Occupational History  . Not on file  Tobacco Use  . Smoking status: Former Games developermoker  .  Smokeless tobacco: Never Used  Substance and Sexual Activity  . Alcohol use: No  . Drug use: No  . Sexual activity: Not on file  Other Topics Concern  . Not on file  Social History Narrative  . Not on file     ROS:    As stated in the HPI and negative for all other systems.  Physical Exam: Blood pressure (!) 142/85, pulse 76, temperature 99.9 F (37.7 C), resp. rate (!) 29, height 5\' 10"  (1.778 m), weight 176 lb 5.9 oz (80 kg), SpO2 100 %.  GENERAL:  Chronically ill appearing HEENT:  Pupils equal round and reactive, fundi not visualized, oral mucosa unremarkable NECK:  No jugular venous distention, waveform within normal limits, carotid upstroke brisk and symmetric, no bruits, no thyromegaly LYMPHATICS:  No cervical, inguinal adenopathy LUNGS:  Clear to auscultation bilaterally BACK:  No CVA tenderness CHEST:  Unremarkable HEART:  PMI not displaced or sustained,S1 and S2 within normal limits, no S3, no S4, no clicks, no rubs, no murmurs ABD:  Distended with multiple bandages and drains, absent bowel sounds EXT:  2 plus pulses throughout, no edema, no cyanosis no clubbing SKIN:  No rashes no nodules NEURO:  Cranial nerves II through XII grossly intact, motor grossly intact throughout PSYCH:  Cognitively intact, oriented to person place and time  Labs: Lab Results  Component Value Date   BUN 52 (H) 08/12/2017   Lab Results  Component Value Date   CREATININE 2.40 (H) 08/13/2017   Lab Results  Component Value Date   NA 142 08/12/2017   K 3.7 08/12/2017   CL 121 (H) 08/12/2017   CO2 21 (L) 08/12/2017   Lab Results  Component Value Date   TROPONINI 0.10 (HH) 08/13/2017   Lab Results  Component Value Date   WBC 13.2 (H) 08/12/2017   HGB 8.1 (L) 08/12/2017   HCT 25.8 (L) 08/12/2017   MCV 92.8 08/12/2017   PLT 463 (H) 08/12/2017   Lab Results  Component Value Date   TRIG 240 (H) 08/12/2017   Lab Results  Component Value Date   ALT 38 08/12/2017   AST 49 (H)  08/12/2017   ALKPHOS 126 08/12/2017   BILITOT 2.3 (H) 08/12/2017    Radiology:   CXR: There is persistent lung base opacity consistent with atelectasis.  Remainder of the lungs is clear.  Endotracheal tube and right PICC are stable in well positioned.  Suspect small pleural effusions.  No pneumothorax  EKG:  NSR, rate 84, axis WNL, LVH, QT prolonged.  T wave inversion in the anterior leads.  (T wave inversion was present on EKG a few days ago.)  ECHO:   07/24/17  Study Conclusions  - Left ventricle: The cavity size was normal. Wall thickness was   increased in a pattern of mild LVH. Systolic function was mildly   reduced. The estimated ejection fraction was in the range of  45%   to 50%. Doppler parameters are consistent with abnormal left   ventricular relaxation (grade 1 diastolic dysfunction).   ASSESSMENT AND PLAN:   BRADYCARDIA:  These appear to be vagal.  Avoid AV nodal blocking agents.  Labetalol has been stopped.  No indication for temporary wire.  Keep atropine at the table and transcutaneous pacer ready.    HTN:  Given this and reduced EF I will start scheduled hydralazine.  Patient on NTG as below.    ATRIAL FIB:    Seems to have resolved.    CKD:  Follow per primary team.  Precludes treatment with ACE or ARB.    REDUCED EF:  Slightly low EF on admission.    ELEVATED TROPONIN:  This is nonspecific in this setting.  However, we need to continue to cycle enzymes.  He does have some T wave inversion that are slightly more pronounced but I suspect that these are changed related to a more rapid rate on the EKG today.  He has no pain and no other symptoms of ischemia.  He is currently on nitrates IV which we can continue.  If the enzymes cycle up consider repeat limited EKG to look for new all motion abnormality.    SignedRollene Rotunda: Madina Galati 08/13/2017, 8:43 AM

## 2017-08-13 NOTE — Plan of Care (Signed)
  Clinical Measurements: Ability to maintain clinical measurements within normal limits will improve 08/13/2017 2111 - Not Progressing by Jasper RilingSarine, Karyssa Amaral M, RN Note Pt with temp pacer for arrhythmia

## 2017-08-13 NOTE — Progress Notes (Signed)
[place] PCCM PROGRESS NOTE  Date of Admission: 07/23/2017 Date of Consult: 07/23/2017 Referring Provider: Dr. Andrey CampanileWilson, CCS Chief Complaint: Abdominal pain  Summary: 61 yo male former smoker presented with severe abdominal pain and found to have free air in abdomen from perforated SB and ischemic cecum.  S/p resection of terminal ileum.  Required re-look for drainage of abdominal abscesses and evacuation of pelvic hematoma.  Past Medical History: ETOH, PE  Subjective: Had episode of chest pain and bradycardia last night.  Denies chest pain this AM.  Breathing okay.  Only complaint is that he wants to sleep.  Vital signs: BP (!) 155/74   Pulse 85   Temp 99.9 F (37.7 C)   Resp (!) 28   Ht 5\' 10"  (1.778 m)   Wt 176 lb 5.9 oz (80 kg)   SpO2 100%   BMI 25.31 kg/m   Intake/Output: I/O last 3 completed shifts: In: 5738.7 [I.V.:5438.7; NG/GT:20; IV Piggyback:280] Out: 6210 [Urine:4650; Drains:135; Stool:1425]  Physical Exam:  General - pleasant Eyes - pupils reactive ENT - no sinus tenderness, no oral exudate, no LAN Cardiac - irregular, no murmur Chest - no wheeze, rales Abd - wound dressings clean Ext - no edema Skin - no rashes Neuro - normal strength Psych - normal mood  Discussion: 61 yo male with SB perforation, cecal ischemia complicated by sepsis with peritonitis, E coli bacteremia, multiple abdominal abscess with E coli, and acute renal failure.  Not candidate for further surgical intervention.  Assessment/Plan:  Acute hypoxic respiratory failure after cardiac arrest 12/19. - monitor respiratory status after extubation 12/24  Peritonitis 2nd to SB perforation and cecal ischemia. Severe protein calorie malnutrition. - post op care, nutrition per CCS - not a candidate for further surgical intervention  Sepsis from peritonitis, E coli bacteremia, multiple abdominal abscesses with E coli. - has been on Abx since 12/04 - continue zosyn  Chest pain with  bradycardia 12/24. A fib with RVR. Hypertension. - cardiology consulted  Acute renal failure from ATN. - monitor renal function  Anemia of critical illness. - f/u CBC  Hyperglycemia. - SS  DVT prophylaxis: SQ heparin SUP: Protonix Diet: TPN Goals of care: Full code.  Sister updated 12/20.  Pt has 2 daughters, but they don't have close relationship.  Will ask palliative care team to assist with discussions about goals of care.   Coralyn HellingVineet Markian Glockner, MD [facility] 08/13/2017, 9:03 AM Pager:  [phone] After 7pm call: [phone]  FLOW SHEET  Cultures: Blood 12/04: E coli Blood 12/10: negative Surgical wound 12/12:  Pan sensitive E. Coli Surgical drain 12/20: E coli  Antibiotics: Vancomycin 12/04 >> 12/04 Zosyn 12/04 >> 12/13 Anidulafungin 12/04 >> 12/17 Meropenem 12/14 >> 12/17 Zosyn 12/17 >>  Anidulafungin 12/19 >> 12/24  Vanco 12/19 >> 12/24  Studies: CT abd/pelvis 12/04 >> free air, SB pneumatosis, b/l inguinal hernias, b/l renal cysts Echo 12/05 >> EF 45 to 50%, mild LVH, grade 1 DD CT abd/pelvis 12/12 >> extraluminal contrast, ascites, abscess Lt hepatic lobe area, RLL consolidation, b/l effusions CT chest/abd/pelvis 12/19 >> pelvic hematoma liquefaction, perisplenic abscess, atelectasis  Events: 12/04 Admit 12/05 Start CRRT 12/06 Ileostomy, closure of abdominal wound 12/08 Off pressors 12/09 Off CRRT 12/12 Laparotomy for abscess, evacuation of hematoma  12/16 Off precedex  12/18 Bicarb added for NG acidosis, tx 1 unit PRBC 12/19 Decompensated, PEA arrest > intubated 12/20 IR drainage of LUQ abscess  Lines/tubes: Lt [place] CVL 12/04 >> 12/16 ETT 12/04 >> 12/04 RUE PICC 12/16 >>  ETT 12/20 >> 12/24  Consults: 12/05 Renal, s/o 12/12 12/19 Wound care 12/20 IR  12/25 Cardiology  Resolved Problems: Septic shock, PEA cardiac arrest  Labs: CMP Latest Ref Rng & Units 08/13/2017 08/12/2017 08/12/2017  Glucose 65 - 99 mg/dL - 161(W126(H)  9,604(VW1,107(HH)  BUN 6 - 20 mg/dL - 09(W52(H) 11(B49(H)  Creatinine 0.61 - 1.24 mg/dL 1.47(W2.40(H) 2.95(A2.41(H) 2.13(Y2.26(H)  Sodium 135 - 145 mmol/L - 142 125(L)  Potassium 3.5 - 5.1 mmol/L - 3.7 3.9  Chloride 101 - 111 mmol/L - 121(H) 105  CO2 22 - 32 mmol/L - 21(L) 19(L)  Calcium 8.9 - 10.3 mg/dL - 8.6(V8.4(L) 7.1(L)  Total Protein 6.5 - 8.1 g/dL - 7.1 7.8(I5.5(L)  Total Bilirubin 0.3 - 1.2 mg/dL - 2.3(H) 0.7  Alkaline Phos 38 - 126 U/L - 126 86  AST 15 - 41 U/L - 49(H) 50(H)  ALT 17 - 63 U/L - 38 27    CBC Latest Ref Rng & Units 08/12/2017 08/12/2017 08/11/2017  WBC 4.0 - 10.5 K/uL 13.2(H) 10.3 11.4(H)  Hemoglobin 13.0 - 17.0 g/dL 8.1(L) 13.3 7.8(L)  Hematocrit 39.0 - 52.0 % 25.8(L) 38.3(L) 23.4(L)  Platelets 150 - 400 K/uL 463(H) 261 378    CBG (last 3)  Recent Labs    08/12/17 2332 08/13/17 0457 08/13/17 0741  GLUCAP 111* 131* 130*    Imaging: No results found.

## 2017-08-13 NOTE — Progress Notes (Signed)
13 Days Post-Op   Subjective/Chief Complaint: Extubated awake alert in good spirits    Objective: Vital signs in last 24 hours: Temp:  [99.9 F (37.7 C)-101.8 F (38.8 C)] 99.9 F (37.7 C) (12/25 0700) Pulse Rate:  [50-97] 76 (12/25 0700) Resp:  [18-37] 29 (12/25 0700) BP: (142-205)/(53-91) 142/85 (12/25 0700) SpO2:  [99 %-100 %] 100 % (12/25 0700) FiO2 (%):  [30 %] 30 % (12/24 0926) Weight:  [80 kg (176 lb 5.9 oz)] 80 kg (176 lb 5.9 oz) (12/25 0500) Last BM Date: 08/12/17  Intake/Output from previous day: 12/24 0701 - 12/25 0700 In: 3440.2 [I.V.:3320.2; NG/GT:20; IV Piggyback:100] Out: 4400 [Urine:2950; Drains:125; Stool:1325] Intake/Output this shift: No intake/output data recorded.  Incision/Wound:wound open and necrotic with chronic dehiscence ostomy viable working  Left JP purulent  Right drains serous   Lab Results:  Recent Labs    08/12/17 0354 08/12/17 1030  WBC 10.3 13.2*  HGB 13.3 8.1*  HCT 38.3* 25.8*  PLT 261 463*   BMET Recent Labs    08/12/17 0354 08/12/17 1030 08/13/17 0500  NA 125* 142  --   K 3.9 3.7  --   CL 105 121*  --   CO2 19* 21*  --   GLUCOSE 1,107* 126*  --   BUN 49* 52*  --   CREATININE 2.26* 2.41* 2.40*  CALCIUM 7.1* 8.4*  --    PT/INR No results for input(s): LABPROT, INR in the last 72 hours. ABG No results for input(s): PHART, HCO3 in the last 72 hours.  Invalid input(s): PCO2, PO2  Studies/Results: No results found.  Anti-infectives: Anti-infectives (From admission, onward)   Start     Dose/Rate Route Frequency Ordered Stop   08/10/17 0500  vancomycin (VANCOCIN) 1,500 mg in sodium chloride 0.9 % 500 mL IVPB  Status:  Discontinued     1,500 mg 250 mL/hr over 120 Minutes Intravenous Every 48 hours 08/08/17 0728 08/12/17 1018   08/08/17 2200  anidulafungin (ERAXIS) 100 mg in sodium chloride 0.9 % 100 mL IVPB  Status:  Discontinued     100 mg 78 mL/hr over 100 Minutes Intravenous Every 24 hours 08/08/17 0524 08/12/17  1018   08/08/17 0445  vancomycin (VANCOCIN) 1,500 mg in sodium chloride 0.9 % 500 mL IVPB     1,500 mg 250 mL/hr over 120 Minutes Intravenous  Once 08/08/17 0431 08/08/17 0659   08/08/17 0430  vancomycin (VANCOCIN) 1,250 mg in sodium chloride 0.9 % 250 mL IVPB  Status:  Discontinued     1,250 mg 166.7 mL/hr over 90 Minutes Intravenous  Once 08/08/17 0418 08/08/17 0428   08/08/17 0415  anidulafungin (ERAXIS) 200 mg in sodium chloride 0.9 % 200 mL IVPB     200 mg 78 mL/hr over 200 Minutes Intravenous  Once 08/08/17 0413 08/08/17 0816   08/05/17 1000  piperacillin-tazobactam (ZOSYN) IVPB 3.375 g     3.375 g 12.5 mL/hr over 240 Minutes Intravenous Every 8 hours 08/05/17 0946     08/02/17 1400  meropenem (MERREM) 1 g in sodium chloride 0.9 % 100 mL IVPB  Status:  Discontinued     1 g 200 mL/hr over 30 Minutes Intravenous Every 12 hours 08/02/17 1111 08/05/17 0945   07/29/17 0600  piperacillin-tazobactam (ZOSYN) IVPB 3.375 g  Status:  Discontinued     3.375 g 12.5 mL/hr over 240 Minutes Intravenous Every 8 hours 07/29/17 0518 08/02/17 1036   07/25/17 2000  vancomycin (VANCOCIN) IVPB 1000 mg/200 mL premix  Status:  Discontinued     1,000 mg 200 mL/hr over 60 Minutes Intravenous Every 48 hours 07/23/17 2042 07/24/17 0956   07/24/17 2200  piperacillin-tazobactam (ZOSYN) 3.375 g in dextrose 5 % 50 mL IVPB  Status:  Discontinued     3.375 g 100 mL/hr over 30 Minutes Intravenous Every 6 hours 07/24/17 2006 07/29/17 0518   07/24/17 2100  anidulafungin (ERAXIS) 100 mg in sodium chloride 0.9 % 100 mL IVPB  Status:  Discontinued     100 mg 78 mL/hr over 100 Minutes Intravenous Every 24 hours 07/23/17 2039 08/05/17 0945   07/24/17 0000  anidulafungin (ERAXIS) 100 mg in sodium chloride 0.9 % 100 mL IVPB  Status:  Discontinued     100 mg 78 mL/hr over 100 Minutes Intravenous Every 24 hours 07/23/17 2038 07/23/17 2039   07/23/17 2200  piperacillin-tazobactam (ZOSYN) IVPB 3.375 g  Status:  Discontinued      3.375 g 12.5 mL/hr over 240 Minutes Intravenous Every 8 hours 07/23/17 2043 07/24/17 2006   07/23/17 2100  anidulafungin (ERAXIS) 200 mg in sodium chloride 0.9 % 200 mL IVPB     200 mg 78 mL/hr over 200 Minutes Intravenous  Once 07/23/17 2038 07/24/17 0239   07/23/17 2100  vancomycin (VANCOCIN) 1,500 mg in sodium chloride 0.9 % 500 mL IVPB     1,500 mg 250 mL/hr over 120 Minutes Intravenous  Once 07/23/17 2041 07/23/17 2317   07/23/17 1515  piperacillin-tazobactam (ZOSYN) IVPB 3.375 g     3.375 g 100 mL/hr over 30 Minutes Intravenous  Once 07/23/17 1511 07/23/17 1611      Assessment/Plan: s/p Procedure(s): EXPLORATORY LAPAROTOMY drainage of abdominal abcess (N/A) Perforated distal small boweland ischemia of cecum 1.S/Pexploratory laparotomy, small bowel resection without anastomosis, placement of open wound VAC system 07/23/17 Dr. Gaynelle AduEric Wilson 2. S/p Reexploration of abdomen, ileocecectomy, creation of end ileostomy,closure of abdominal wall, 07/25/17, Dr. Almond LintFaera Byerly 3. Exploratory laparotomy, drainage of abdominal abscess and evacuation of pelvic hematoma, RUQ drain, and left lateral drain placement, 07/31/17, Dr. Glenna FellowsBenjamin Hoxworth (Findings: Subdiaphragmatic and subhepatic abscesses.Large organizing pelvic hematoma. Apparent necrotic tissue left lobe of the liver.) Sepsis/shock -resolved, wbc decreasing Bacteremia - Enterobacteriaceae + E Coli, on abx per ccm, awaiting additional drain cultures Cardiac arrest - Code Blue 08/08/17 - reintubated/pressors/increase of antibiotics Extubated yesterday / off pressors  Acute respiratory failure -remains on ventper ccm Acute renal failure-CRRT discontinued 07/27/17 - creatinine continues to decrease Malnutrition/Decondtioning - Prealbumin <5last time checked, on tpn Anemia - stable  GI-needs continued dressing changes, wound isdehiscedbut not much more to do is certainly at risk, continue drains will need repeat CT  Thursday  ZO:[id number]:Vancomycin 07/23/17- 07/24/17,restarted 12/20 day 1,Zosyn 07/23/17- 12/14,anidulafungin 07/23/17 - 12/17,restarted 12/20 day 1,Meropenem 12/14 - 12/17 =>>Zosyn restarted 12/17 =>>day5anidulafunginrestarted 12/20 =>>day 2 Vancomycin restarted 12/20 =>>day 2 WJX:[id number]:SCDs - Heparin Follow-up:Dr. Gaynelle AduEric Wilson     LOS: 21 days    Caleb Singh 08/13/2017

## 2017-08-13 NOTE — Consult Note (Signed)
 CARDIOLOGY CONSULT NOTE  Patient ID: Caleb Singh MRN: [id number] DOB/AGE: 09/23/1955 61 y.o.  Admit date: 07/23/2017 Primary Physician Patient, No Pcp Per Primary Cardiologist None Chief Complaint  Bradycardia Requesting  Dr. Deterding  HPI:   The patient has had an extensive hospital course as below.   He has no cardiac history prior to presentation.   We are called because of episodes of bradycardia.  We are asked to see the patient by Dr. Detterding.  He has been hypertensive and was getting PRN labetalol.  This was changed to hydralazine early this AM.  The patient has also had chest pain.     The patient was admitted on 12/4 with perforated small bowel.  He had E Coli bacteremia.   He had small bowel resection.   He has required CRRT and has had to have exploratory lap for drainage of abscess.  He has also had a pelvic hematoma.  On 12/20 a code blue was called as he had asystole.  He was (re)intubated.  This was preceded by a decline over a few days with continued continued intraperitoneal abscess that required drainage. I also note that he has had intermittent atrial fib reported.   The patient seems to be mildly confused but does not report any prior cardiac history.  In particular he has had no syncope.  I spoke with nursing who was given a report of a 20 second pauses.  However, there is no documentation of this and we could not find this searching tele.  There are several bradycardic episodes with 4 - 5 second pauses.  These seem to be with stimulation such as suction or bathing.  He was able to sit in a chair yesterday without difficulty.  He is able to talk and is awake during these events.  He denies any chest pain or SOB.   History reviewed. No pertinent past medical history.  Past Surgical History:  Procedure Laterality Date  . I&D EXTREMITY Right 04/17/2016   Procedure: IRRIGATION AND DEBRIDEMENT RIGHT HAND;  Surgeon: William Gramig, MD;  Location: [facility] OR;  Service:  Orthopedics;  Laterality: Right;  . I&D EXTREMITY Right 04/19/2016   Procedure: REPEAT I&D RIGHT HAND;  Surgeon: William Gramig, MD;  Location: [facility] OR;  Service: Orthopedics;  Laterality: Right;  . LAPAROTOMY N/A 07/23/2017   Procedure: EXPLORATORY LAPAROTOMY, SMALL BOWEL RESECTION;  Surgeon: Wilson, Eric, MD;  Location: [facility] ORS;  Service: General;  Laterality: N/A;  . LAPAROTOMY N/A 07/25/2017   Procedure: EXPLORATORY LAPAROSCOPY WITH ILEOCECTOMY, END ILEOSTOMY;  Surgeon: Byerly, Faera, MD;  Location: [facility] ORS;  Service: General;  Laterality: N/A;  PATIENT ABDOMINAL WOUND LEFT OPEN AND PACKED WITH BULKY DRESSING  . LAPAROTOMY N/A 07/31/2017   Procedure: EXPLORATORY LAPAROTOMY drainage of abdominal abcess;  Surgeon: Hoxworth, Benjamin, MD;  Location: [facility] ORS;  Service: General;  Laterality: N/A;    No Known Allergies Medications Prior to Admission  Medication Sig Dispense Refill Last Dose  . clindamycin (CLEOCIN) 300 MG capsule Take 1 capsule (300 mg total) by mouth 3 (three) times daily. (Patient not taking: Reported on 07/23/2017) 42 capsule 0 Completed Course at Unknown time  . oxyCODONE (OXY IR/ROXICODONE) 5 MG immediate release tablet Take 1-2 tablets (5-10 mg total) by mouth every 3 (three) hours as needed for moderate pain. (Patient not taking: Reported on 07/23/2017) 40 tablet 0 Completed Course at Unknown time   History reviewed. No pertinent family history.  Social History   Socioeconomic History  . Marital status:   Single    Spouse name: Not on file  . Number of children: Not on file  . Years of education: Not on file  . Highest education level: Not on file  Social Needs  . Financial resource strain: Not on file  . Food insecurity - worry: Not on file  . Food insecurity - inability: Not on file  . Transportation needs - medical: Not on file  . Transportation needs - non-medical: Not on file  Occupational History  . Not on file  Tobacco Use  . Smoking status: Former Smoker  .  Smokeless tobacco: Never Used  Substance and Sexual Activity  . Alcohol use: No  . Drug use: No  . Sexual activity: Not on file  Other Topics Concern  . Not on file  Social History Narrative  . Not on file     ROS:    As stated in the HPI and negative for all other systems.  Physical Exam: Blood pressure (!) 142/85, pulse 76, temperature 99.9 F (37.7 C), resp. rate (!) 29, height 5' 10" (1.778 m), weight 176 lb 5.9 oz (80 kg), SpO2 100 %.  GENERAL:  Chronically ill appearing HEENT:  Pupils equal round and reactive, fundi not visualized, oral mucosa unremarkable NECK:  No jugular venous distention, waveform within normal limits, carotid upstroke brisk and symmetric, no bruits, no thyromegaly LYMPHATICS:  No cervical, inguinal adenopathy LUNGS:  Clear to auscultation bilaterally BACK:  No CVA tenderness CHEST:  Unremarkable HEART:  PMI not displaced or sustained,S1 and S2 within normal limits, no S3, no S4, no clicks, no rubs, no murmurs ABD:  Distended with multiple bandages and drains, absent bowel sounds EXT:  2 plus pulses throughout, no edema, no cyanosis no clubbing SKIN:  No rashes no nodules NEURO:  Cranial nerves II through XII grossly intact, motor grossly intact throughout PSYCH:  Cognitively intact, oriented to person place and time  Labs: Lab Results  Component Value Date   BUN 52 (H) 08/12/2017   Lab Results  Component Value Date   CREATININE 2.40 (H) 08/13/2017   Lab Results  Component Value Date   NA 142 08/12/2017   K 3.7 08/12/2017   CL 121 (H) 08/12/2017   CO2 21 (L) 08/12/2017   Lab Results  Component Value Date   TROPONINI 0.10 (HH) 08/13/2017   Lab Results  Component Value Date   WBC 13.2 (H) 08/12/2017   HGB 8.1 (L) 08/12/2017   HCT 25.8 (L) 08/12/2017   MCV 92.8 08/12/2017   PLT 463 (H) 08/12/2017   Lab Results  Component Value Date   TRIG 240 (H) 08/12/2017   Lab Results  Component Value Date   ALT 38 08/12/2017   AST 49 (H)  08/12/2017   ALKPHOS 126 08/12/2017   BILITOT 2.3 (H) 08/12/2017    Radiology:   CXR: There is persistent lung base opacity consistent with atelectasis.  Remainder of the lungs is clear.  Endotracheal tube and right PICC are stable in well positioned.  Suspect small pleural effusions.  No pneumothorax  EKG:  NSR, rate 84, axis WNL, LVH, QT prolonged.  T wave inversion in the anterior leads.  (T wave inversion was present on EKG a few days ago.)  ECHO:   07/24/17  Study Conclusions  - Left ventricle: The cavity size was normal. Wall thickness was   increased in a pattern of mild LVH. Systolic function was mildly   reduced. The estimated ejection fraction was in the range of   45%   to 50%. Doppler parameters are consistent with abnormal left   ventricular relaxation (grade 1 diastolic dysfunction).   ASSESSMENT AND PLAN:   BRADYCARDIA:  These appear to be vagal.  Avoid AV nodal blocking agents.  Labetalol has been stopped.  No indication for temporary wire.  Keep atropine at the table and transcutaneous pacer ready.    HTN:  Given this and reduced EF I will start scheduled hydralazine.  Patient on NTG as below.    ATRIAL FIB:    Seems to have resolved.    CKD:  Follow per primary team.  Precludes treatment with ACE or ARB.    REDUCED EF:  Slightly low EF on admission.    ELEVATED TROPONIN:  This is nonspecific in this setting.  However, we need to continue to cycle enzymes.  He does have some T wave inversion that are slightly more pronounced but I suspect that these are changed related to a more rapid rate on the EKG today.  He has no pain and no other symptoms of ischemia.  He is currently on nitrates IV which we can continue.  If the enzymes cycle up consider repeat limited EKG to look for new all motion abnormality.    Signed: Alaria Oconnor 08/13/2017, 8:43 AM     

## 2017-08-13 NOTE — Interval H&P Note (Signed)
History and Physical Interval Note:  08/13/2017 12:53 PM  Caleb Singh  has presented today for surgery, with the diagnosis of severe bradycardia and pauses The various methods of treatment have been discussed with the patient and family. After consideration of risks, benefits and other options for treatment, the patient has consented to  Procedure(s): Temporary Pacemaker Insertion LEFT HEART CATH AND CORONARY ANGIOGRAPHY (N/A) and possible coronary angioplasty as a surgical intervention .  The patient's history has been reviewed, patient examined, no change in status, stable for surgery.  I have reviewed the patient's chart and labs.  Questions were answered to the patient's satisfaction.     Daniel Bensimhon

## 2017-08-13 NOTE — Progress Notes (Signed)
Pt developed recurrent episodes of bradycardia with altered mental status and hypotension.  Rhythm strip shows ventricular escape beats during episodes with return to a fib.  Cardiology planning transvenous pacer insertion.  [facility]  Updated family at bedside about plan.  Coralyn HellingVineet Willadeen Colantuono, MD [facility] 08/13/2017, 12:16 PM Pager:  [phone] After 3pm call: [phone]

## 2017-08-13 NOTE — Progress Notes (Signed)
eLink Physician-Brief Progress Note Patient Name: Caleb LaundryFrederick Singh DOB: Mar 03, 1956 MRN: [id number]   Date of Service  08/13/2017  HPI/Events of Note  Patient with intermittent episodes of bradycardia into the 20s.  He has been hypertensive.  D/Ced the prn labetalol and ordered prn hydralazine.  Now with CP with continued episodes of bradycardia.  Ordered SL NTG and PRN morphine.  EKG ordered along with trop.    eICU Interventions  NTG gtt for hypertension and CP DA gtt for HR Cycle trop O2 Cardiology consult      Intervention Category Major Interventions: Arrhythmia - evaluation and management  Raymonda Pell 08/13/2017, 4:48 AM

## 2017-08-13 NOTE — Progress Notes (Signed)
PHARMACY - ADULT TOTAL PARENTERAL NUTRITION CONSULT NOTE   Pharmacy Consult for TPN Indication: prolonged ileus  Patient Measurements: Body mass index is 25.31 kg/m. Filed Weights   08/11/17 0740 08/12/17 0359 08/13/17 0500  Weight: 181 lb 7 oz (82.3 kg) 179 lb 14.3 oz (81.6 kg) 176 lb 5.9 oz (80 kg)    HPI: [name] admitted on 12/4 with c/o severe and worsening abdominal distention, pain, nonbloody, nonbilious, non-coffee-ground emesis and lack of bowel movements x3 days.  No significant PMH.  He was found to have perforated small bowel s/p repair in OR, and remains intubated/sedated on pressors postop.  Pharmacy is consulted to dose TPN.  Significant events:  12/4 SB resection, open wound VAC.  NG tube placed 12/5 Started CRRT 12/6 Repeat OR on for washout, ileocecectomy, creation of end ileostomy,closure of abdominal wall 12/9 CRRT off 12/9 am 12/12 Exploratory laparotomy, drainage of abdominal abscess and evacuation of pelvic hematoma, RUQ drain, and left lateral drain placement. Findings per surgery note: Subdiaphragmatic and subhepatic abscesses.Large organizing pelvic hematoma. Apparent necrotic tissue left lobe of the liver. 12/14 Extubated 12/17 Some drainage through his ileostomy, but no bowel sounds.  No diet, may have ice chips, sips of clears. 12/18 Transfuse PRBC, start bicarb, cont sips of clears only 12/20 Pt unresponsive, code blue, now re-intubated.  TPN was stopped during code.  Additional drain placed by IR.  Insulin requirements past 24 hours: 4 units SSI/24h  Current Nutrition: NPO.  TPN at goal rate  IVF: D5W at 3230ml/hr  Central access: CVC triple lumen placed 12/4 TPN start date: 12/7  ASSESSMENT                                                                                                          Today:   Glucose:  goal < 150 (no hx of DM), CBGs controlled (111-130).     Electrolytes: Na WNL, CO2 low today & have been trending down since off  NaBicarb. Cl elevated but stable. Mag, Phos WNL. CorrCa 10.12 WNL. K+ low.  Electrolytes removed from TPN on 12/17.  NaBicarb infusion started 12/18, stopped 12/21  Renal:  SCr 2.4, (off CRRT 12/9a)    I/O = +345 mL/24h; drain output 105 mL (3 drains), stool output 975 mL in ostomy; UOP 2640 mL   Weight~ admission weight (79.4 kg).    LFTs:  WNL except slight increase AST  TGs: 233 (12/5), 400 (12/6), 281 (12/7 - propofol stopped), 322 (12/10), 231 (12/14), 216 (12/17), 240 (12/24)  Prealbumin:  9.3 (12/5), 9 (12/10), 18.8 (12/17), IP (12/24)  NUTRITIONAL GOALS  RD recs (updated 12/21): 110-126g Protein, 2269 Kcal  Based on published guidelines, while the patient meets ICU status the initiation of lipids is being delayed for 7 days or until transition out of ICU. Planned start date for lipids is 12/14.  Goal will be to meet 100% of the patient's protein needs and approximately 80% of their caloric needs.  Clinimix 5/15 @ 100 mL/hr and lipids 20% at 7815ml/hr over 12h to provide  120 grams protein (100% of goal) and 2064 kcal (91% of goal).  PLAN                                                                                                                         Now:  IV KCl x 4 runs today (already ordered by MD)  At 1800 today:  Continue Clinimix 5/15 NO electrolyte formula at 100 ml/hr   Started lipids 12/14 (held for first week of ICU status) at 3515ml/hr over 12h (dose reduction due to mildly elevated trigs)  TPN to contain standard multivitamins and trace element daily.  IVF per MD  Continue CBGs q6h checks and SSI moderate scale.   TPN lab panels on Mondays & Thursdays.    Consider cyclic TPN if prolonged course is needed.  Junita PushMichelle Arohi Singh, PharmD, BCPS 08/13/2017 8:12 AM

## 2017-08-13 NOTE — Progress Notes (Signed)
Pt had frequent pauses and had become symptomatic. The longest pause lasting about 20 seconds. Pt HR would slow down, pt would become restless, nauseas, and diaphoretic and pause would occur. Pt HR returned to 70s on it's own repeatedly without intervention.  Nurse remained at bedside with pt during period of frequent brady/ pauses. CCM and Cardiology notified. [facility]

## 2017-08-13 NOTE — Progress Notes (Signed)
Pt HR dropped down into 40s nonsustained then back up to 80s. Pt calm, laying in bed at time, alert and oriented BP stable. Pt nonsymptomatic and with no complaints. Will continue to monitor.

## 2017-08-13 NOTE — Progress Notes (Signed)
Patient has multiple episodes of asystole, pauses, bradycardia leading to junctional rhythm then back to sinus rhythm. E. Link notified multiple times, advice contemplating cardiology consult.

## 2017-08-13 NOTE — Progress Notes (Signed)
eLink Physician-Brief Progress Note Patient Name: Caleb Singh DOB: 04/27/1956 MRN: [id number]   Date of Service  08/13/2017  HPI/Events of Note  Patient's presentation discussed with Dr. Gala RomneyBensimhon.  Agrees with NTG gtt, cycle trop.  Hold on DA and heparin for now.  eICU Interventions  NTG gtt Cycle trop Cardiology to see patient        DETERDING,[name] 08/13/2017, 5:13 AM

## 2017-08-14 ENCOUNTER — Encounter (HOSPITAL_COMMUNITY): Payer: Self-pay | Admitting: Internal Medicine

## 2017-08-14 DIAGNOSIS — R570 Cardiogenic shock: Secondary | ICD-10-CM

## 2017-08-14 LAB — BASIC METABOLIC PANEL
ANION GAP: 5 (ref 5–15)
BUN: 47 mg/dL — ABNORMAL HIGH (ref 6–20)
CHLORIDE: 122 mmol/L — AB (ref 101–111)
CO2: 17 mmol/L — ABNORMAL LOW (ref 22–32)
CREATININE: 2.21 mg/dL — AB (ref 0.61–1.24)
Calcium: 8.4 mg/dL — ABNORMAL LOW (ref 8.9–10.3)
GFR calc non Af Amer: 30 mL/min — ABNORMAL LOW (ref >60)
GFR, EST AFRICAN AMERICAN: 35 mL/min — AB (ref >60)
Glucose, Bld: 124 mg/dL — ABNORMAL HIGH (ref 65–99)
POTASSIUM: 3.2 mmol/L — AB (ref 3.5–5.1)
SODIUM: 144 mmol/L (ref 135–145)

## 2017-08-14 LAB — GLUCOSE, CAPILLARY
GLUCOSE-CAPILLARY: 126 mg/dL — AB (ref 65–99)
GLUCOSE-CAPILLARY: 132 mg/dL — AB (ref 65–99)
Glucose-Capillary: 139 mg/dL — ABNORMAL HIGH (ref 65–99)
Glucose-Capillary: 111 mg/dL — ABNORMAL HIGH (ref 65–99)

## 2017-08-14 LAB — CBC
HEMATOCRIT: 25.5 % — AB (ref 39.0–52.0)
HEMOGLOBIN: 8.2 g/dL — AB (ref 13.0–17.0)
MCH: 29.5 pg (ref 26.0–34.0)
MCHC: 32.2 g/dL (ref 30.0–36.0)
MCV: 91.7 fL (ref 78.0–100.0)
Platelets: 523 10*3/uL — ABNORMAL HIGH (ref 150–400)
RBC: 2.78 MIL/uL — AB (ref 4.22–5.81)
RDW: 15.6 % — ABNORMAL HIGH (ref 11.5–15.5)
WBC: 14.2 10*3/uL — AB (ref 4.0–10.5)

## 2017-08-14 LAB — MAGNESIUM: MAGNESIUM: 2.1 mg/dL (ref 1.7–2.4)

## 2017-08-14 MED ORDER — TRAVASOL 10 % IV SOLN
INTRAVENOUS | Status: DC
  Filled 2017-08-14: qty 1132.8

## 2017-08-14 MED ORDER — STERILE WATER FOR INJECTION IV SOLN
INTRAVENOUS | Status: DC
  Administered 2017-08-14 – 2017-08-16 (×4): via INTRAVENOUS
  Filled 2017-08-14 (×6): qty 850

## 2017-08-14 MED ORDER — POTASSIUM CHLORIDE 10 MEQ/50ML IV SOLN
10.0000 meq | INTRAVENOUS | Status: AC
  Administered 2017-08-14 (×6): 10 meq via INTRAVENOUS
  Filled 2017-08-14 (×6): qty 50

## 2017-08-14 MED ORDER — TRAVASOL 10 % IV SOLN
INTRAVENOUS | Status: AC
  Administered 2017-08-14: 17:00:00 via INTRAVENOUS
  Filled 2017-08-14: qty 1142.4

## 2017-08-14 NOTE — Progress Notes (Signed)
[facility] PCCM PROGRESS NOTE  Date of Admission: 07/23/2017 Date of Consult: 07/23/2017 Referring Provider: Dr. Andrey CampanileWilson, CCS Chief Complaint: Abdominal pain  Summary: 61 yo male former smoker presented with severe abdominal pain and found to have free air in abdomen from perforated SB and ischemic cecum.  S/p resection of terminal ileum.  Required re-look for drainage of abdominal abscesses and evacuation of pelvic hematoma.  Past Medical History: ETOH, PE  Subjective: Had left heart cath with temp pacer placed 12/25 (see findings under studies section). No additional acute events.  Vital signs: BP (!) 171/82   Pulse 86   Temp 99.1 F (37.3 C)   Resp (!) 31   Ht 5\' 10"  (1.778 m)   Wt 76.1 kg (167 lb 12.3 oz)   SpO2 95%   BMI 24.07 kg/m   Intake/Output: I/O last 3 completed shifts: In: 5743.2 [I.V.:5298.2; Other:45; IV Piggyback:400] Out: 5830 [Urine:4400; Drains:155; Stool:1275]  Physical Exam: General - pleasant male, no distress HEENT  - [place] / AT, MM dry, EOMI Cardiac - irregular, no murmur Chest - no wheeze, rales Abd - wound dressings clean Ext - no edema Skin - no rashes Neuro - normal strength Psych - normal mood  Discussion: 61 yo male with SB perforation, cecal ischemia complicated by sepsis with peritonitis, E coli bacteremia, multiple abdominal abscess with E coli, and acute renal failure.  Not candidate for further surgical intervention.  Assessment/Plan:  Acute hypoxic respiratory failure after cardiac arrest 12/19 - resolved. - monitor respiratory status after extubation 12/24  Peritonitis 2nd to SB perforation and cecal ischemia. Severe protein calorie malnutrition. - post op care, nutrition per CCS - not a candidate for further surgical intervention - CCS planning for repeat CT Thurs 12/27  Sepsis from peritonitis, E coli bacteremia, multiple abdominal abscesses with E coli. - has been on Abx since 12/04 - continue zosyn  Chest pain with  bradycardia 12/24 - s/p LHC and temp pacer placement 12/25. A fib with RVR. Hypertension. - cardiology following  Acute renal failure from ATN. Hypokalemia - s/p repletion AM 12/26. NAGMA. - monitor renal function - start HCO3 gtt  Anemia of critical illness. - f/u CBC  Hyperglycemia. - SSI  DVT prophylaxis: SQ heparin SUP: Protonix Diet: TPN Goals of care: Full code.  Sister updated 12/20.  Pt has 2 daughters, but they don't have close relationship.  Will ask palliative care team to assist with discussions about goals of care.    Rutherford Guysahul Desai, [place] - [facility] Pager: [phone]  or [phone] 08/14/2017, 7:50 AM   FLOW SHEET  Cultures: Blood 12/04: E coli Blood 12/10: negative Surgical wound 12/12:  Pan sensitive E. Coli Surgical drain 12/20: E coli  Antibiotics: Vancomycin 12/04 >> 12/04 Zosyn 12/04 >> 12/13 Anidulafungin 12/04 >> 12/17 Meropenem 12/14 >> 12/17 Zosyn 12/17 >>  Anidulafungin 12/19 >> 12/24  Vanco 12/19 >> 12/24  Studies: CT abd/pelvis 12/04 >> free air, SB pneumatosis, b/l inguinal hernias, b/l renal cysts Echo 12/05 >> EF 45 to 50%, mild LVH, grade 1 DD CT abd/pelvis 12/12 >> extraluminal contrast, ascites, abscess Lt hepatic lobe area, RLL consolidation, b/l effusions CT chest/abd/pelvis 12/19 >> pelvic hematoma liquefaction, perisplenic abscess, atelectasis LHC 12/25 > normal coronaries, TV pacer placed with backup rate of 40bpm.  Events: 12/04 Admit 12/05 Start CRRT 12/06 Ileostomy, closure of abdominal wound 12/08 Off pressors 12/09 Off CRRT 12/12 Laparotomy for abscess, evacuation of hematoma  12/16 Off precedex  12/18 Bicarb added for NG acidosis, tx 1 unit PRBC 12/19 Decompensated, PEA arrest > intubated 12/20 IR drainage of LUQ abscess 12/25 > [facility] and temp venous pacer placement  Lines/tubes: Lt [place] CVL 12/04 >> 12/16 ETT 12/04 >> 12/04 RUE PICC 12/16 >>   ETT 12/20 >> 12/24 R IJ temp venous pacer 12/26 >   Consults: 12/05 Renal, s/o 12/12 12/19 Wound care 12/20 IR  12/25 Cardiology 12/26 Palliative  Resolved Problems: Septic shock, PEA cardiac arrest, respiratory failure with inability to protect the airway  Labs: CMP Latest Ref Rng & Units 08/14/2017 08/13/2017 08/13/2017  Glucose 65 - 99 mg/dL 409(W124(H) - 119(J133(H)  BUN 6 - 20 mg/dL 47(W47(H) - 29(F48(H)  Creatinine 0.61 - 1.24 mg/dL 6.21(H2.21(H) 0.86(V2.31(H) 7.84(O2.27(H)  Sodium 135 - 145 mmol/L 144 - 144  Potassium 3.5 - 5.1 mmol/L 3.2(L) - 3.1(L)  Chloride 101 - 111 mmol/L 122(H) - 122(H)  CO2 22 - 32 mmol/L 17(L) - 18(L)  Calcium 8.9 - 10.3 mg/dL 9.6(E8.4(L) - 8.2(L)  Total Protein 6.5 - 8.1 g/dL - - -  Total Bilirubin 0.3 - 1.2 mg/dL - - -  Alkaline Phos 38 - 126 U/L - - -  AST 15 - 41 U/L - - -  ALT 17 - 63 U/L - - -    CBC Latest Ref Rng & Units 08/14/2017 08/13/2017 08/13/2017  WBC 4.0 - 10.5 K/uL 14.2(H) 13.1(H) 13.7(H)  Hemoglobin 13.0 - 17.0 g/dL 8.2(L) 7.6(L) 7.7(L)  Hematocrit 39.0 - 52.0 % 25.5(L) 23.9(L) 24.2(L)  Platelets 150 - 400 K/uL 523(H) 480(H) 473(H)    CBG (last 3)  Recent Labs    08/13/17 1839 08/13/17 2359 08/14/17 0622  GLUCAP 126* 126* 132*    Imaging: No results found.          Attending Note:  61 year old with extensive PMH who presented to PCCM with bradycardia that required a temp pacer placement that is required continuous pacing.  On exam, clear lungs with no edema bilaterally.  I reviewed CXR myself, catheter and pacer noted.  Will continue pacing for now.  Discussed with cards.  EP to see patient today, will likely need a perm pacer.  Will hold in the ICU today given need for pacing.  PCCM will continue to follow.  The patient is critically ill with multiple organ systems failure and requires high complexity decision making for assessment and support, frequent evaluation and titration of therapies, application of advanced monitoring technologies and  extensive interpretation of multiple databases.   Critical Care Time devoted to patient care services described in this note is  35  Minutes. This time reflects time of care of this signee Dr Koren BoundWesam Yacoub. This critical care time does not reflect procedure time, or teaching time or supervisory time of PA/NP/Med student/Med Resident etc but could involve care discussion time.  Alyson ReedyWesam G. Yacoub, M.D. [facility]. Pager: [phone]. After hours pager: [phone].

## 2017-08-14 NOTE — Care Management Note (Signed)
Case Management Note  Patient Details  Name: Caleb Singh MRN: [id number] Date of Birth: [birth year]  Subjective/Objective:  Peritonitis secondary to small bowel perforation, s/p resection of termineal ileum required relook for drainage of abd abscess and evacuation of pelvic hematoma, s/p left heart cath with temp pacer placed yesterday, ep consulted, conts on iv abx, tpn, hydralazine iv, iv pain meds, ivk runs x 6, sodium bicarb drip, palliative consulted for goc. Pt rec SNF.                   Action/Plan: NCM will follow along with CSW for dc needs.   Expected Discharge Date:  (unknown)               Expected Discharge Plan:  Skilled Nursing Facility  In-House Referral:  Clinical Social Work  Discharge planning Services  CM Consult  Post Acute Care Choice:    Choice offered to:     DME Arranged:    DME Agency:     HH Arranged:    HH Agency:     Status of Service:  In process, will continue to follow  If discussed at Long Length of Stay Meetings, dates discussed:    Additional Comments:  Leone Havenaylor, Nina Hoar [place], RN 08/14/2017, 6:28 PM

## 2017-08-14 NOTE — Progress Notes (Signed)
Progress Note  Patient Name: Caleb Singh Date of Encounter: 08/14/2017  Primary Cardiologist:   No primary care provider on file.   Subjective   Looks fatigued.  No acute pain.   Inpatient Medications    Scheduled Meds: . chlorhexidine gluconate (MEDLINE KIT)  15 mL Mouth Rinse BID  . Chlorhexidine Gluconate Cloth  6 each Topical Daily  . heparin  5,000 Units Subcutaneous Q8H  . hydrALAZINE  20 mg Intravenous Q4H  . insulin aspart  0-15 Units Subcutaneous Q6H  . mouth rinse  15 mL Mouth Rinse BID  . pantoprazole (PROTONIX) IV  40 mg Intravenous Q24H  . sodium chloride flush  10-40 mL Intracatheter Q12H  . sodium chloride flush  3 mL Intravenous Q12H  . sodium chloride flush  5 mL Intravenous Q8H   Continuous Infusions: . sodium chloride 250 mL (08/14/17 0700)  . piperacillin-tazobactam (ZOSYN)  IV Stopped (08/14/17 0600)  . potassium chloride Stopped (08/14/17 0931)  .  sodium bicarbonate (isotonic) infusion in sterile water 75 mL/hr at 08/14/17 0932  . TPN (CLINIMIX) Adult without lytes 100 mL/hr at 08/14/17 0700  . TPN ADULT (ION)     PRN Meds: sodium chloride, acetaminophen, acetaminophen, iopamidol, morphine injection, nitroGLYCERIN, ondansetron (ZOFRAN) IV, sodium chloride flush, sodium chloride flush   Vital Signs    Vitals:   08/14/17 0400 08/14/17 0500 08/14/17 0600 08/14/17 0700  BP: (!) 165/78 (!) 156/84 (!) 151/70 (!) 171/82  Pulse: 96 (!) 104 86 86  Resp: (!) 31 (!) 29 (!) 40 (!) 31  Temp: 100 F (37.8 C) 100 F (37.8 C) 99.9 F (37.7 C) 99.1 F (37.3 C)  TempSrc:      SpO2: 100% 100% 98% 95%  Weight:   167 lb 12.3 oz (76.1 kg)   Height:        Intake/Output Summary (Last 24 hours) at 08/14/2017 0934 Last data filed at 08/14/2017 0700 Gross per 24 hour  Intake 4093 ml  Output 3260 ml  Net 833 ml   Filed Weights   08/12/17 0359 08/13/17 0500 08/14/17 0600  Weight: 179 lb 14.3 oz (81.6 kg) 176 lb 5.9 oz (80 kg) 167 lb 12.3 oz (76.1  kg)    Telemetry    Sinus tach with bradycardia and periodic pacing.  - Personally Reviewed  ECG    NA - Personally Reviewed  Physical Exam   GEN:   Acutely ill  Neck: No  JVD Cardiac: RRR, no murmurs, rubs, or gallops.  Respiratory: Clear  to auscultation bilaterally. GI:    Distended with decreased bowel sounds and tender MS:  Diffuse mild edema; No deformity. Neuro:  Nonfocal  Psych: Normal affect   Labs    Chemistry Recent Labs  Lab 08/10/17 0415  08/12/17 0354 08/12/17 1030  08/13/17 1055 08/13/17 1430 08/14/17 0418  NA 146*   < > 125* 142  --  144  --  144  K 3.2*   < > 3.9 3.7  --  3.1*  --  3.2*  CL 118*   < > 105 121*  --  122*  --  122*  CO2 23   < > 19* 21*  --  18*  --  17*  GLUCOSE 124*   < > 1,107* 126*  --  133*  --  124*  BUN 70*   < > 49* 52*  --  48*  --  47*  CREATININE 2.53*   < > 2.26* 2.41*   < >  2.27* 2.31* 2.21*  CALCIUM 7.6*   < > 7.1* 8.4*  --  8.2*  --  8.4*  PROT 6.6  --  5.5* 7.1  --   --   --   --   ALBUMIN 1.6*  --  1.5* 1.6*  --   --   --   --   AST 33  --  50* 49*  --   --   --   --   ALT 26  --  27 38  --   --   --   --   ALKPHOS 81  --  86 126  --   --   --   --   BILITOT 2.0*  --  0.7 2.3*  --   --   --   --   GFRNONAA 25*   < > 29* 27*   < > 29* 28* 30*  GFRAA 29*   < > 34* 31*   < > 34* 33* 35*  ANIONGAP 5   < > 1* 0*  --  4*  --  5   < > = values in this interval not displayed.     Hematology Recent Labs  Lab 08/13/17 1055 08/13/17 1430 08/14/17 0418  WBC 13.7* 13.1* 14.2*  RBC 2.64* 2.62* 2.78*  HGB 7.7* 7.6* 8.2*  HCT 24.2* 23.9* 25.5*  MCV 91.7 91.2 91.7  MCH 29.2 29.0 29.5  MCHC 31.8 31.8 32.2  RDW 15.4 15.1 15.6*  PLT 473* 480* 523*    Cardiac Enzymes Recent Labs  Lab 08/08/17 0145 08/08/17 1120 08/13/17 0447 08/13/17 1055  TROPONINI 0.13* 0.17* 0.10* 0.10*   No results for input(s): TROPIPOC in the last 168 hours.   BNPNo results for input(s): BNP, PROBNP in the last 168 hours.   DDimer No  results for input(s): DDIMER in the last 168 hours.   Radiology    No results found.  Cardiac Studies   ECHO:    Study Conclusions  - Left ventricle: The cavity size was normal. Wall thickness was   increased in a pattern of mild LVH. Systolic function was mildly   reduced. The estimated ejection fraction was in the range of 45%to 50%. Doppler parameters are consistent with abnormal left ventricular relaxation (grade 1 diastolic dysfunction).   Patient Profile     61 y.o. male without prior cardiac history.  He has had SBO and several surgeries.  We were called yesterday because of continue bradycardiac episodes.  Required temporary wire.    Assessment & Plan    BRADYCARDIA:  Progressive bradycardia without vagal stimuli yesterday.  Now with a temporary pacing wire.  EP to see in consultation.    HTN:   Hydralazine added and increased today.  Holding ACE/ARB with increased creat.  Holding beta blocker.   CARDIOMYOPATHY:  Mildly reduced EF.  No ischemia.    ELEVATED TROPONIN:  Normal coronaries.  Demand ischemia.    AKI:  Creat is stable.    For questions or updates, please contact [place] Please consult [email or web address] for contact info under Cardiology/STEMI.   Signed, Minus Breeding, MD  08/14/2017, 9:34 AM

## 2017-08-14 NOTE — Progress Notes (Signed)
[facility] FOR AM LAB REPLACEMENT ONLY  The patient does not apply for the [facility] Electrolyte Replacment Protocol based on the criteria listed below:   1. Is GFR >/= 40 ml/min? No.  Patient's GFR today is 30  4. Abnormal electrolyte(s): K3.2  6. If a panic level lab has been reported, has the CCM MD in charge been notified? Yes.  .   Physician:  Holland CommonsE Deterding, MD  [place] NakayamaChisholm, Ashanty Coltrane William 08/14/2017 6:17 AM

## 2017-08-14 NOTE — Progress Notes (Signed)
eLink Physician-Brief Progress Note Patient Name: Caleb LaundryFrederick Kipper DOB: [birth year] MRN: [id number]   Date of Service  08/14/2017  HPI/Events of Note  Hypokalemia  eICU Interventions  Potassium replaced     Intervention Category Intermediate Interventions: Electrolyte abnormality - evaluation and management  DETERDING,[name] 08/14/2017, 5:21 AM

## 2017-08-14 NOTE — Progress Notes (Signed)
Physical Therapy Treatment Patient Details Name: Caleb LaundryFrederick Singh MRN: [id number] DOB: Oct 30, 1955 Today's Date: 08/14/2017    History of Present Illness Pt is a 61 yo male admitted 07/23/17 with intestinal perforation. Now s/p resection & ileostomy on 12/5 and 12/6; s/p abscess draining 12/12. VDRF. A fib with RVR. ETT 12/20 >> 12/24. 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 PMH includes ETOH abuse, PE.    PT Comments    Pt progressing with mobility. Able to perform bed mobility and take steps to chair with modA (+2 safety/lines). Pt with significant fatigue and SOB post-mobility, with RR up to 56. SpO2 97% on RA, HR 50s-118, BP 188/91 upon sitting in chair. Encouraged pursed lip breathing and rest throughout session. Reviewed IS use. Will continue to follow acutely.   Follow Up Recommendations  SNF     Equipment Recommendations  Other (comment)(TBD)    Recommendations for Other Services       Precautions / Restrictions Precautions Precautions: Fall Precaution Comments: Multiple lines Restrictions Weight Bearing Restrictions: No    Mobility  Bed Mobility Overal bed mobility: Needs Assistance Bed Mobility: Supine to Sit     Supine to sit: Mod assist;+2 for safety/equipment;HOB elevated     General bed mobility comments: Pt able to move BLEs well to EOB, requiring modA for trunk elevation; RN present assisting with lines  Transfers Overall transfer level: Needs assistance Equipment used: 1 person hand held assist Transfers: Sit to/from Stand Sit to Stand: Mod assist;+2 safety/equipment         General transfer comment: Able to stand on 3rd attempt, requiring modA (+2 safety) to assist trunk elevation. Cues to encouraged bilat hip extension, as pt tends to flex forward significant. C/o lightheadedness in sitting, which subsided some with rest and pursed lip breathing  Ambulation/Gait Ambulation/Gait assistance: Mod  assist;+2 safety/equipment Ambulation Distance (Feet): 3 Feet Assistive device: 1 person hand held assist Gait Pattern/deviations: Step-to pattern Gait velocity: Decreased Gait velocity interpretation: <1.8 ft/sec, indicative of risk for recurrent falls General Gait Details: Pt able to take steps from bed to chair with modA (+2 lines/safety) to maintain trunk elevation and prevent knee buckling; cues to maintain upright posture and extend hips. Pt very fatigued after upon sitting with RR up to 56   Stairs            Wheelchair Mobility    Modified Rankin (Stroke Patients Only)       Balance Overall balance assessment: Needs assistance Sitting-balance support: Bilateral upper extremity supported;Feet supported Sitting balance-Leahy Scale: Fair Sitting balance - Comments: Min guard for sitting balance     Standing balance-Leahy Scale: Poor                              Cognition Arousal/Alertness: Awake/alert Behavior During Therapy: WFL for tasks assessed/performed Overall Cognitive Status: Within Functional Limits for tasks assessed                                        Exercises      General Comments        Pertinent Vitals/Pain Pain Assessment: No/denies pain Pain Intervention(s): Monitored during session    Home Living                      Prior Function  PT Goals (current goals can now be found in the care plan section) Acute Rehab PT Goals Patient Stated Goal: to regain PLOF PT Goal Formulation: With patient Time For Goal Achievement: 08/26/17 Potential to Achieve Goals: Good Progress towards PT goals: Progressing toward goals    Frequency    Min 2X/week      PT Plan Frequency needs to be updated    Co-evaluation              AM-PAC PT "6 Clicks" Daily Activity  Outcome Measure  Difficulty turning over in bed (including adjusting bedclothes, sheets and blankets)?:  Unable Difficulty moving from lying on back to sitting on the side of the bed? : Unable Difficulty sitting down on and standing up from a chair with arms (e.g., wheelchair, bedside commode, etc,.)?: Unable Help needed moving to and from a bed to chair (including a wheelchair)?: A Lot Help needed walking in hospital room?: A Lot Help needed climbing 3-5 steps with a railing? : Total 6 Click Score: 8    End of Session Equipment Utilized During Treatment: Gait belt Activity Tolerance: Patient tolerated treatment well;Patient limited by fatigue Patient left: in chair;with call bell/phone within reach Nurse Communication: Mobility status PT Visit Diagnosis: Muscle weakness (generalized) (M62.81);Other abnormalities of gait and mobility (R26.89)     Time: 0840-0900 PT Time Calculation (min) (ACUTE ONLY): 20 min  Charges:  $Therapeutic Activity: 8-22 mins                    G Codes:      Ina HomesJaclyn Keeyon Privitera, PT, DPT Acute Rehab Services  Pager: [phone]  Malachy ChamberJaclyn L Samie Reasons 08/14/2017, 10:00 AM

## 2017-08-14 NOTE — Progress Notes (Signed)
    Referring Physician(s): Dr. Delton CoombesByrum  Supervising Physician: Oley BalmHassell, Daniel  Patient Status:  [facility]  Chief Complaint: Intra-abdominal abscess  Subjective: Patient intermittently confused. With fevers today.  Drain still with continued purulent output.   Allergies: Patient has no known allergies.  Medications: Prior to Admission medications   Medication Sig Start Date End Date Taking? Authorizing Provider  clindamycin (CLEOCIN) 300 MG capsule Take 1 capsule (300 mg total) by mouth 3 (three) times daily. Patient not taking: Reported on 07/23/2017 04/21/16   Dominica SeverinGramig, William, MD  oxyCODONE (OXY IR/ROXICODONE) 5 MG immediate release tablet Take 1-2 tablets (5-10 mg total) by mouth every 3 (three) hours as needed for moderate pain. Patient not taking: Reported on 07/23/2017 04/21/16   Dominica SeverinGramig, William, MD     Vital Signs: BP (!) 173/99   Pulse 72   Temp 99.1 F (37.3 C)   Resp (!) 26   Ht 5\' 10"  (1.778 m)   Wt 167 lb 12.3 oz (76.1 kg)   SpO2 99%   BMI 24.07 kg/m   Physical Exam  NAD, alert Abd:  LUQ drain in place with tan purulent output.  Abdomen soft, tender.   Imaging: No results found.  Labs:  CBC: Recent Labs    08/12/17 1030 08/13/17 1055 08/13/17 1430 08/14/17 0418  WBC 13.2* 13.7* 13.1* 14.2*  HGB 8.1* 7.7* 7.6* 8.2*  HCT 25.8* 24.2* 23.9* 25.5*  PLT 463* 473* 480* 523*    COAGS: Recent Labs    07/23/17 1455 07/24/17 0350 07/31/17 0745 08/08/17 1329  INR 1.78 1.77 1.58 1.45    BMP: Recent Labs    08/12/17 0354 08/12/17 1030 08/13/17 0500 08/13/17 1055 08/13/17 1430 08/14/17 0418  NA 125* 142  --  144  --  144  K 3.9 3.7  --  3.1*  --  3.2*  CL 105 121*  --  122*  --  122*  CO2 19* 21*  --  18*  --  17*  GLUCOSE 1,107* 126*  --  133*  --  124*  BUN 49* 52*  --  48*  --  47*  CALCIUM 7.1* 8.4*  --  8.2*  --  8.4*  CREATININE 2.26* 2.41* 2.40* 2.27* 2.31* 2.21*  GFRNONAA 29* 27* 27* 29* 28* 30*  GFRAA 34* 31* 31* 34* 33* 35*      LIVER FUNCTION TESTS: Recent Labs    08/08/17 0145 08/10/17 0415 08/12/17 0354 08/12/17 1030  BILITOT 2.0* 2.0* 0.7 2.3*  AST 42* 33 50* 49*  ALT 33 26 27 38  ALKPHOS 111 81 86 126  PROT 7.7 6.6 5.5* 7.1  ALBUMIN 2.1* 1.6* 1.5* 1.6*    Assessment and Plan: Intraabdominal abscess s/p drain placement 12/20. Output from left drain remains tan, purulent-appearing.  Patient with ongoing fevers and elevated WBC.  Surgery is following- planning for repeat CT scan tomorrow.  Continue current management. IR to follow.  Electronically Signed: Hoyt KochKacie Sue-Ellen Danaiya Steadman, PA 08/14/2017, 4:24 PM   I spent a total of 15 Minutes at the the patient's bedside AND on the patient's hospital floor or unit, greater than 50% of which was counseling/coordinating care for intra-abdominal abscess.

## 2017-08-14 NOTE — Consult Note (Signed)
Consultation Note Date: 08/14/2017   Patient Name: Caleb Singh  DOB: 1956-07-21  MRN: [id number]  Age / Sex: 61 y.o., male  PCP: Patient, No Pcp Per Referring Physician: Brand Males, MD  Reason for Consultation: Establishing goals of care and Psychosocial/spiritual support  HPI/Patient Profile: 61 y.o. male   admitted on 07/23/2017 with  perforated small bowel.  He had E Coli bacteremia.   He had small bowel resection.   He has required CRRT and has had to have exploratory lap for drainage of abscess.  He has also had a pelvic hematoma.  On 12/20 a code blue was called as he had asystole.  He was (re)intubated.   He is currently extubated, on TPN, is critically ill with multiple system involvement and high risk for decompensation.  Patient  Faces long term GOCs,  advance directive decisions, and anticipatory care needs.  Clinical Assessment and Goals of Care:  This NP Wadie Lessen reviewed medical records, received report from team, assessed the patient and then meet at the patient's bedside to introduce palliative medicine into his holistic treatment plan and   to discuss his current medical situation and his GOCs.  In the afternoon I spoke to his daughter Caleb Singh regarding above.  Values and goals of care important to patient  were attempted to be elicited.  Questions and concerns addressed.    PMT will continue to support holistically.  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   He likes to sketch and paint   Prognosis:   Unable to determine  Discharge Planning: To Be Determined      Primary Diagnoses: Present on Admission: . Free intraperitoneal air   I have reviewed the medical record, interviewed the patient and family, and examined the patient. The following aspects are pertinent.  History reviewed. No pertinent past medical history. Social History   Socioeconomic History  . Marital status: Single    Spouse name: None  . Number of children: None  . Years of education: None  . Highest education level: None  Social Needs  . Financial resource strain: None  . Food insecurity - worry: None  . Food insecurity - inability: None  . Transportation needs - medical: None  . Transportation needs - non-medical: None  Occupational History  . None  Tobacco Use  . Smoking status: Former Research scientist (life sciences)  . Smokeless tobacco: Never Used  Substance and Sexual Activity  . Alcohol use: No  . Drug use: No  . Sexual activity: None  Other Topics Concern  . None  Social History Narrative  . None   History reviewed. No pertinent family history. Scheduled Meds: . chlorhexidine  gluconate (MEDLINE KIT)  15 mL Mouth Rinse BID  . Chlorhexidine Gluconate Cloth  6 each Topical Daily  . heparin  5,000 Units Subcutaneous Q8H  . hydrALAZINE  20 mg Intravenous Q4H  . insulin aspart  0-15 Units Subcutaneous Q6H  . mouth rinse  15 mL Mouth Rinse BID  . pantoprazole (PROTONIX) IV  40 mg Intravenous Q24H  . sodium chloride flush  10-40 mL Intracatheter Q12H  . sodium chloride flush  3 mL Intravenous Q12H  . sodium chloride flush  5 mL Intravenous Q8H   Continuous Infusions: . sodium chloride 250 mL (08/14/17 0700)  . dextrose 30 mL/hr at 08/14/17 0700  . piperacillin-tazobactam (ZOSYN)  IV Stopped (08/14/17 0600)  . potassium chloride Stopped (08/14/17 0755)  . TPN (CLINIMIX) Adult without lytes 100 mL/hr at 08/14/17 0700   PRN Meds:.sodium chloride, acetaminophen,  acetaminophen, iopamidol, morphine injection, nitroGLYCERIN, ondansetron (ZOFRAN) IV, sodium chloride flush, sodium chloride flush Medications Prior to Admission:  Prior to Admission medications   Medication Sig Start Date End Date Taking? Authorizing Provider  clindamycin (CLEOCIN) 300 MG capsule Take 1 capsule (300 mg total) by mouth 3 (three) times daily. Patient not taking: Reported on 07/23/2017 04/21/16   Roseanne Kaufman, MD  oxyCODONE (OXY IR/ROXICODONE) 5 MG immediate release tablet Take 1-2 tablets (5-10 mg total) by mouth every 3 (three) hours as needed for moderate pain. Patient not taking: Reported on 07/23/2017 04/21/16   Roseanne Kaufman, MD   No Known Allergies Review of Systems  Respiratory: Positive for apnea.   Neurological: Positive for weakness.    Physical Exam  Constitutional: He appears lethargic. He appears ill. Nasal cannula in place.  Cardiovascular: Normal rate, regular rhythm and normal heart sounds.  Pulmonary/Chest: Tachypnea noted.  Musculoskeletal:  -genralized weakness  Neurological: He appears lethargic.  Skin: Skin is warm and dry.    Vital Signs: BP (!) 171/82   Pulse 86   Temp 99.1 F (37.3 C)   Resp (!) 31   Ht _0  (1.778 m)   Wt 76.1 kg (167 lb 12.3 oz)   SpO2 95%   BMI 24.07 kg/m  Pain Assessment: No/denies pain POSS *See Group Information*: 1-Acceptable,Awake and alert Pain Score: Asleep   SpO2: SpO2: 95 % O2 Device:SpO2: 95 % O2 Flow Rate: .O2 Flow Rate (L/min): 2 L/min  IO: Intake/output summary:   Intake/Output Summary (Last 24 hours) at 08/14/2017 0755 Last data filed at 08/14/2017 0700 Gross per 24 hour  Intake 4093 ml  Output 3475 ml  Net 618 ml    LBM: Last BM Date: 08/13/17 Baseline Weight: Weight: 79.4 kg (175 lb) Most recent weight: Weight: 76.1 kg (167 lb 12.3 oz)     Palliative Assessment/Data:  30 % at best   Discussed with beside RN  Time In: 0730 Time Out: 0830 Time Total: 60 minutes Greater than 50%   of this time was spent counseling and coordinating care related to the above assessment and plan.  Signed by: Wadie Lessen, NP   Please contact Palliative Medicine Team phone at [phone] for questions and concerns.  For individual provider: See Shea Evans

## 2017-08-14 NOTE — Progress Notes (Signed)
1 Day Post-Op   Subjective/Chief Complaint: Extubated awake alert in good spirits, denies abdominal pain, wants to know when he can eat   Objective: Vital signs in last 24 hours: Temp:  [99.1 F (37.3 C)-100.4 F (38 C)] 99.1 F (37.3 C) (12/26 0700) Pulse Rate:  [54-104] 86 (12/26 0700) Resp:  [17-62] 31 (12/26 0700) BP: (128-203)/(47-143) 171/82 (12/26 0700) SpO2:  [95 %-100 %] 95 % (12/26 0700) Weight:  [76.1 kg (167 lb 12.3 oz)] 76.1 kg (167 lb 12.3 oz) (12/26 0600) Last BM Date: 08/13/17  Intake/Output from previous day: 12/25 0701 - 12/26 0700 In: 4093 [I.V.:3648; IV Piggyback:400] Out: 3475 [Urine:2750; Drains:125; Stool:600] Intake/Output this shift: No intake/output data recorded.  Incision/Wound:wound open and necrotic with chronic dehiscence ostomy viable working  Left JP purulent  Right drains one is purulent the other is serous Abdomen not tender, mildly distended, edematous  Lab Results:  Recent Labs    08/13/17 1430 08/14/17 0418  WBC 13.1* 14.2*  HGB 7.6* 8.2*  HCT 23.9* 25.5*  PLT 480* 523*   BMET Recent Labs    08/13/17 1055 08/13/17 1430 08/14/17 0418  NA 144  --  144  K 3.1*  --  3.2*  CL 122*  --  122*  CO2 18*  --  17*  GLUCOSE 133*  --  124*  BUN 48*  --  47*  CREATININE 2.27* 2.31* 2.21*  CALCIUM 8.2*  --  8.4*   PT/INR No results for input(s): LABPROT, INR in the last 72 hours. ABG No results for input(s): PHART, HCO3 in the last 72 hours.  Invalid input(s): PCO2, PO2  Studies/Results: No results found.  Anti-infectives: Anti-infectives (From admission, onward)   Start     Dose/Rate Route Frequency Ordered Stop   08/10/17 0500  vancomycin (VANCOCIN) 1,500 mg in sodium chloride 0.9 % 500 mL IVPB  Status:  Discontinued     1,500 mg 250 mL/hr over 120 Minutes Intravenous Every 48 hours 08/08/17 0728 08/12/17 1018   08/08/17 2200  anidulafungin (ERAXIS) 100 mg in sodium chloride 0.9 % 100 mL IVPB  Status:  Discontinued     100 mg 78 mL/hr over 100 Minutes Intravenous Every 24 hours 08/08/17 0524 08/12/17 1018   08/08/17 0445  vancomycin (VANCOCIN) 1,500 mg in sodium chloride 0.9 % 500 mL IVPB     1,500 mg 250 mL/hr over 120 Minutes Intravenous  Once 08/08/17 0431 08/08/17 0659   08/08/17 0430  vancomycin (VANCOCIN) 1,250 mg in sodium chloride 0.9 % 250 mL IVPB  Status:  Discontinued     1,250 mg 166.7 mL/hr over 90 Minutes Intravenous  Once 08/08/17 0418 08/08/17 0428   08/08/17 0415  anidulafungin (ERAXIS) 200 mg in sodium chloride 0.9 % 200 mL IVPB     200 mg 78 mL/hr over 200 Minutes Intravenous  Once 08/08/17 0413 08/08/17 0816   08/05/17 1000  piperacillin-tazobactam (ZOSYN) IVPB 3.375 g     3.375 g 12.5 mL/hr over 240 Minutes Intravenous Every 8 hours 08/05/17 0946     08/02/17 1400  meropenem (MERREM) 1 g in sodium chloride 0.9 % 100 mL IVPB  Status:  Discontinued     1 g 200 mL/hr over 30 Minutes Intravenous Every 12 hours 08/02/17 1111 08/05/17 0945   07/29/17 0600  piperacillin-tazobactam (ZOSYN) IVPB 3.375 g  Status:  Discontinued     3.375 g 12.5 mL/hr over 240 Minutes Intravenous Every 8 hours 07/29/17 0518 08/02/17 1036   07/25/17 2000  vancomycin (  VANCOCIN) IVPB 1000 mg/200 mL premix  Status:  Discontinued     1,000 mg 200 mL/hr over 60 Minutes Intravenous Every 48 hours 07/23/17 2042 07/24/17 0956   07/24/17 2200  piperacillin-tazobactam (ZOSYN) 3.375 g in dextrose 5 % 50 mL IVPB  Status:  Discontinued     3.375 g 100 mL/hr over 30 Minutes Intravenous Every 6 hours 07/24/17 2006 07/29/17 0518   07/24/17 2100  anidulafungin (ERAXIS) 100 mg in sodium chloride 0.9 % 100 mL IVPB  Status:  Discontinued     100 mg 78 mL/hr over 100 Minutes Intravenous Every 24 hours 07/23/17 2039 08/05/17 0945   07/24/17 0000  anidulafungin (ERAXIS) 100 mg in sodium chloride 0.9 % 100 mL IVPB  Status:  Discontinued     100 mg 78 mL/hr over 100 Minutes Intravenous Every 24 hours 07/23/17 2038 07/23/17 2039    07/23/17 2200  piperacillin-tazobactam (ZOSYN) IVPB 3.375 g  Status:  Discontinued     3.375 g 12.5 mL/hr over 240 Minutes Intravenous Every 8 hours 07/23/17 2043 07/24/17 2006   07/23/17 2100  anidulafungin (ERAXIS) 200 mg in sodium chloride 0.9 % 200 mL IVPB     200 mg 78 mL/hr over 200 Minutes Intravenous  Once 07/23/17 2038 07/24/17 0239   07/23/17 2100  vancomycin (VANCOCIN) 1,500 mg in sodium chloride 0.9 % 500 mL IVPB     1,500 mg 250 mL/hr over 120 Minutes Intravenous  Once 07/23/17 2041 07/23/17 2317   07/23/17 1515  piperacillin-tazobactam (ZOSYN) IVPB 3.375 g     3.375 g 100 mL/hr over 30 Minutes Intravenous  Once 07/23/17 1511 07/23/17 1611      Assessment/Plan: s/p Procedure(s): Temporary Pacemaker Insertion LEFT HEART CATH AND CORONARY ANGIOGRAPHY (N/A) Perforated distal small boweland ischemia of cecum 1.S/Pexploratory laparotomy, small bowel resection without anastomosis, placement of open wound VAC system 07/23/17 Dr. Gaynelle AduEric Wilson 2. S/p Reexploration of abdomen, ileocecectomy, creation of end ileostomy,closure of abdominal wall, 07/25/17, Dr. Almond LintFaera Byerly 3. Exploratory laparotomy, drainage of abdominal abscess and evacuation of pelvic hematoma, RUQ drain, and left lateral drain placement, 07/31/17, Dr. Glenna FellowsBenjamin Hoxworth (Findings: Subdiaphragmatic and subhepatic abscesses.Large organizing pelvic hematoma. Apparent necrotic tissue left lobe of the liver.) Sepsis/shock -resolved, wbc decreasing Bacteremia - Enterobacteriaceae + E Coli, on abx per ccm, awaiting additional drain cultures Cardiac arrest - Code Blue 08/08/17 - reintubated/pressors/increase of antibiotics Extubated 12/24, off pressors  Acute respiratory failure -per ccm Acute renal failure-CRRT discontinued 07/27/17 - creatinine continues to decrease Malnutrition/Decondtioning - Prealbumin <5last time checked, on tpn Anemia - stable  GI-needs continued dressing changes, wound  isdehiscedbut not much more to do is certainly at risk, continue drains will need repeat CT Thursday  Keep NPO ZO:[id number]:Vancomycin 07/23/17- 07/24/17,restarted 12/20 day 1,Zosyn 07/23/17- 12/14,anidulafungin 07/23/17 - 12/17,restarted 12/20 day 1,Meropenem 12/14 - 12/17 =>>Zosyn restarted 12/17 =>>anidulafunginrestarted 12/20 =>> Vancomycin restarted 12/20 =>>  WJX:BJYNVT:SCDs - Heparin Follow-up:Dr. Gaynelle AduEric Wilson     LOS: 22 days    Berna Buehelsea A Connor 08/14/2017

## 2017-08-14 NOTE — Progress Notes (Signed)
OT Cancellation Note  Patient Details Name: Caleb LaundryFrederick Singh MRN: [id number] DOB: 10-06-1955   Cancelled Treatment:    Reason Eval/Treat Not Completed: Fatigue/lethargy limiting ability to participate.  Pt sleeping soundly - has been up in chair since PT this am, and recently back to bed.  Will reattempt.  Jeani HawkingWendi Emanuela Runnion, [name] [phone]   Jeani HawkingConarpe, Lachell Rochette M 08/14/2017, 2:08 PM

## 2017-08-14 NOTE — Progress Notes (Signed)
Pharmacy Antibiotic Note  Caleb LaundryFrederick Singh is a 61 y.o. male admitted on 07/23/2017 with sepsis, intra-abdominal infection.  Pharmacy has been consulted for zosyn dosing.  [facility] for placement of temp wire due to episodes of bradycardia. Currently on day 23 of antibiotics- currently only on zosyn. Last abscess culture continuing to grow pan-sensitive E Coli. WBC 14.2 today. Renal function remains stable (SCr 2.21).  Tmax in 24 hrs was 100.8.  Plan to repeat CT images tomorrow. Based on findings, will discuss opportunities for de-escalation with teams.   Plan:  Continue Zosyn 3.375g IV Q8H infused over 4hrs.  Follow up renal fxn, culture results, and clinical course.   Height: 5\' 10"  (177.8 cm) Weight: 167 lb 12.3 oz (76.1 kg) IBW/kg (Calculated) : 73  Temp (24hrs), Avg:99.8 F (37.7 C), Min:99.1 F (37.3 C), Max:100.4 F (38 C)  Recent Labs  Lab 08/12/17 0354 08/12/17 1030 08/13/17 0500 08/13/17 1055 08/13/17 1430 08/14/17 0418  WBC 10.3 13.2*  --  13.7* 13.1* 14.2*  CREATININE 2.26* 2.41* 2.40* 2.27* 2.31* 2.21*    Estimated Creatinine Clearance: 35.3 mL/min (A) (by C-G formula based on SCr of 2.21 mg/dL (H)).    No Known Allergies  Antimicrobials this admission: 12/4 Vancomycin>>12/5 12/20 >>12/24 12/4 Zosyn>>12/14 12/17 >> 12/14 meropenem >> 12/17 12/4 Eraxis >> 12/17 12/20 >>12/24  Microbiology results: 12/4 MRSA PCR: neg 12/4BCx: Ecoli  (pan-sensitive)       12/5  BCID = E.coli, no resistance 12/10 BCx: NGF 12/11 UCx: NGF 12/12 abscess cx: E coli (pan-sensitive) 12/20 Abscess: E Coli (pan-sensitive)  Thank you for allowing pharmacy to be a part of this patient's care.  Girard CooterKimberly Perkins, PharmD Clinical Pharmacist  Pager: [phone] Clinical Phone for 08/14/2017 until 3:30pm: [phone] If after 3:30pm, please call main pharmacy at [phone] 08/14/2017 10:42 AM

## 2017-08-14 NOTE — Plan of Care (Signed)
Patient family visited during shift. Patient currently resting in bed. Vital signs stable. No new complaints at this time. Pt is progressing. Will continue to monitor.    Progressing Health Behavior/Discharge Planning: Ability to manage health-related needs will improve 08/14/2017 1849 - Progressing by Araceli BoucheJammeh-Loum, Tyreak Reagle, RN Clinical Measurements: Ability to maintain clinical measurements within normal limits will improve 08/14/2017 1849 - Progressing by Araceli BoucheJammeh-Loum, Kamarian Sahakian, RN Will remain free from infection 08/14/2017 1849 - Progressing by Araceli BoucheJammeh-Loum, Tyron Manetta, RN Diagnostic test results will improve 08/14/2017 1849 - Progressing by Araceli BoucheJammeh-Loum, Aliahna Statzer, RN Respiratory complications will improve 08/14/2017 1849 - Progressing by Araceli BoucheJammeh-Loum, Zebediah Beezley, RN Cardiovascular complication will be avoided 08/14/2017 1849 - Progressing by Araceli BoucheJammeh-Loum, Nimrit Kehres, RN Skin Integrity: Risk for impaired skin integrity will decrease 08/14/2017 1849 - Progressing by Araceli BoucheJammeh-Loum, Katheryne Gorr, RN

## 2017-08-14 NOTE — Progress Notes (Addendum)
PHARMACY - ADULT TOTAL PARENTERAL NUTRITION CONSULT NOTE   Pharmacy Consult:  TPN Indication:  Prolonged ileus  Patient Measurements: Height: 5\' 10"  (177.8 cm) Weight: 167 lb 12.3 oz (76.1 kg) IBW/kg (Calculated) : 73 TPN AdjBW (KG): 79.4 Body mass index is 24.07 kg/m.  Assessment:  Caleb Singh admitted on 07/23/17 with severe and worsening abdominal distention, pain, emesis and lack of bowel movements x3 days.  No significant PMH.  He was found to have perforated small bowel s/p repair on 07/23/17 and washout, ileocecetomy, ileostomy and closure of abdominal wall on 07/25/17.  Pharmacy consulted to manage TPN.  [facility] for transvenous pacer insertion.  GI: ex-lap with drainage of abd abscess and evacuation of pelvic hematoma 12/12.  Prealbumin low at 14.8, drain O/P 125mL, ileostomy O/P 600mL (down).  PPI IV Endo: no hx DM - CBGs well controlled Insulin requirements in the past 24 hours: 6 units SSI Lytes: K+ 3.2 (goal >/= 4 for ileus, 6 runs ordered), CL elevated, low CO2, others WNL Renal: CRRT 12/5 >> 12/9.  SCr 2.21 (was 0.99), BUN 47 - UOP 1.6 ml/kg/hr, net +2.6L since admit, D5W at 30 ml/hr Pulm: extubated 12/14, re-intubated 12/20 during code and extubated again, currently on RA Cards: s/p code blue 12/20, cath 12/25 - BP elevated, HR WNL (TV pacer) - IV hydralazine Hepatobil: liver necrosis - AST mildly elevated, tbili high at 2.3 (no jaundice per RN), TG elevated at 240 Neuro: A&O ID: [email or web address] for E.coli abscess and bacteremia - Tmax 100.4, WBC up 14.2 TPN Access: PICC triple lumen placed 08/04/17 TPN start date: 07/26/17  Nutritional Goals (per RD rec on 12/21): 2200-2300 kCal and 110-125gm protein per day  Current Nutrition:  TPN   Plan:  Switch to customized TPN:  TPN at 85 ml/hr TPN will provide 114gm AA, 428g CHO, and 34gm lipids, which equals to 2260 kCal, meeting 100% of needs. Electrolytes in TPN: low dose Phos/Mag/Ca, no Na, Cl:Ac to max  Ac, add K (13602mEq/day, ~half by AM lab time) Daily multivitamin and trace elements in TPN Continue moderate SSI Q6H Continue D5W at 30 ml/hr per MD.  Watch Na and recommend to discontinue if appropriate. Consider narrowing antibiotic F/U AM labs, repeat CT, repeat TG Friday (reduce lipid in TPN)   Nykeem Citro D. Laney Potashang, PharmD, BCPS Pager:  [phone] 08/14/2017, 8:04 AM

## 2017-08-15 ENCOUNTER — Inpatient Hospital Stay (HOSPITAL_COMMUNITY): Payer: Non-veteran care

## 2017-08-15 DIAGNOSIS — G9340 Encephalopathy, unspecified: Secondary | ICD-10-CM

## 2017-08-15 DIAGNOSIS — T8143XA Infection following a procedure, organ and space surgical site, initial encounter: Secondary | ICD-10-CM

## 2017-08-15 DIAGNOSIS — R001 Bradycardia, unspecified: Secondary | ICD-10-CM

## 2017-08-15 DIAGNOSIS — T8149XA Infection following a procedure, other surgical site, initial encounter: Secondary | ICD-10-CM

## 2017-08-15 DIAGNOSIS — Z7189 Other specified counseling: Secondary | ICD-10-CM

## 2017-08-15 DIAGNOSIS — R531 Weakness: Secondary | ICD-10-CM

## 2017-08-15 DIAGNOSIS — Z515 Encounter for palliative care: Secondary | ICD-10-CM

## 2017-08-15 DIAGNOSIS — I469 Cardiac arrest, cause unspecified: Secondary | ICD-10-CM

## 2017-08-15 LAB — PHOSPHORUS: PHOSPHORUS: 2.9 mg/dL (ref 2.5–4.6)

## 2017-08-15 LAB — COMPREHENSIVE METABOLIC PANEL
ALT: 145 U/L — AB (ref 17–63)
AST: 375 U/L — AB (ref 15–41)
Albumin: 1.4 g/dL — ABNORMAL LOW (ref 3.5–5.0)
Alkaline Phosphatase: 142 U/L — ABNORMAL HIGH (ref 38–126)
Anion gap: 7 (ref 5–15)
BILIRUBIN TOTAL: 1.2 mg/dL (ref 0.3–1.2)
BUN: 50 mg/dL — AB (ref 6–20)
CO2: 20 mmol/L — ABNORMAL LOW (ref 22–32)
CREATININE: 2.08 mg/dL — AB (ref 0.61–1.24)
Calcium: 8.3 mg/dL — ABNORMAL LOW (ref 8.9–10.3)
Chloride: 121 mmol/L — ABNORMAL HIGH (ref 101–111)
GFR, EST AFRICAN AMERICAN: 37 mL/min — AB (ref >60)
GFR, EST NON AFRICAN AMERICAN: 32 mL/min — AB (ref >60)
Glucose, Bld: 142 mg/dL — ABNORMAL HIGH (ref 65–99)
POTASSIUM: 3.7 mmol/L (ref 3.5–5.1)
Sodium: 148 mmol/L — ABNORMAL HIGH (ref 135–145)
TOTAL PROTEIN: 6.6 g/dL (ref 6.5–8.1)

## 2017-08-15 LAB — CBC
HCT: 28.2 % — ABNORMAL LOW (ref 39.0–52.0)
Hemoglobin: 9 g/dL — ABNORMAL LOW (ref 13.0–17.0)
MCH: 28.8 pg (ref 26.0–34.0)
MCHC: 31.9 g/dL (ref 30.0–36.0)
MCV: 90.1 fL (ref 78.0–100.0)
PLATELETS: 587 10*3/uL — AB (ref 150–400)
RBC: 3.13 MIL/uL — AB (ref 4.22–5.81)
RDW: 15.5 % (ref 11.5–15.5)
WBC: 18.3 10*3/uL — ABNORMAL HIGH (ref 4.0–10.5)

## 2017-08-15 LAB — GLUCOSE, CAPILLARY
GLUCOSE-CAPILLARY: 118 mg/dL — AB (ref 65–99)
GLUCOSE-CAPILLARY: 125 mg/dL — AB (ref 65–99)
Glucose-Capillary: 121 mg/dL — ABNORMAL HIGH (ref 65–99)
Glucose-Capillary: 125 mg/dL — ABNORMAL HIGH (ref 65–99)
Glucose-Capillary: 139 mg/dL — ABNORMAL HIGH (ref 65–99)

## 2017-08-15 LAB — MAGNESIUM: MAGNESIUM: 2 mg/dL (ref 1.7–2.4)

## 2017-08-15 MED ORDER — ALTEPLASE 2 MG IJ SOLR
2.0000 mg | Freq: Once | INTRAMUSCULAR | Status: DC
  Filled 2017-08-15: qty 2

## 2017-08-15 MED ORDER — TRAVASOL 10 % IV SOLN
INTRAVENOUS | Status: AC
  Administered 2017-08-15: 18:00:00 via INTRAVENOUS
  Filled 2017-08-15: qty 1274.4

## 2017-08-15 MED ORDER — IOPAMIDOL (ISOVUE-300) INJECTION 61%
INTRAVENOUS | Status: AC
  Administered 2017-08-15: 10:00:00
  Filled 2017-08-15: qty 30

## 2017-08-15 NOTE — Progress Notes (Signed)
PHARMACY - ADULT TOTAL PARENTERAL NUTRITION CONSULT NOTE   Pharmacy Consult:  TPN Indication:  Prolonged ileus  Patient Measurements: Height: '5\' 10"'$  (177.8 cm) Weight: 173 lb 4.5 oz (78.6 kg) IBW/kg (Calculated) : 73 TPN AdjBW (KG): 78.6 Body mass index is 24.86 kg/m.  Assessment:  49 YOM admitted on 07/23/17 with severe and worsening abdominal distention, pain, emesis and lack of bowel movements x3 days.  No significant PMH.  He was found to have perforated small bowel s/p repair on 07/23/17 and washout, ileocecetomy, ileostomy and closure of abdominal wall on 07/25/17.  Pharmacy consulted to manage TPN.  Patient transferred to [facility] on 08/13/17 for transvenous pacer insertion.  GI: ex-lap with drainage of abd abscess and evacuation of pelvic hematoma 12/12.  Wound is dehisced, repeat CT today.  Prealbumin low at 14.8, drain O/P 44m, ileostomy O/P 276m(down).  PPI IV Endo: no hx DM - CBGs well controlled Insulin requirements in the past 24 hours: 6 units SSI Lytes: Na/CL elevated, K+ 3.7 (goal >/= 4 for ileus), CO2 improved to 20, others WNL Renal: CRRT 12/5 >> 12/9.  SCr down 2.08 (was 0.99), BUN 50 - UOP 1.4 ml/kg/hr, net +4.8L since admit, bicarb gtt at 75 ml/hr Pulm: extubated 12/14, re-intubated 12/20 during code and extubated again, currently on RA Cards: s/p code blue 12/20, cath 12/25 - BP elevated, HR WNL (TV pacer) - IV hydralazine Hepatobil: liver necrosis - AST/ALT 375/148, alk phos elevated at 142, tbili down to 1.2, TG elevated at 240 Neuro: A&O ID: [email or web address] for E.coli abscess and bacteremia - afebrile, WBC up 18.3 TPN Access: PICC triple lumen placed 08/04/17 TPN start date: 07/26/17  Nutritional Goals (per RD rec on 12/27): Re-estimated needs: 2200-2400 kCal and 118-141gm protein per day  Current Nutrition:  TPN   Plan:  Increase TPN to 90 ml/hr, tweak formula to provide 127gm AA, 410g CHO, and 37gm lipids, which equals to 2272 kCal, meeting 100% of  needs Electrolytes in TPN: low dose Phos/Mag/Ca, no Na, max acetate, K providing 977mday Daily multivitamin and trace elements in TPN Continue moderate SSI Q6H Bicarb gtt at 75 ml/hr per MD F/U repeat CT, repeat TG (reduce lipid in TPN), Na and the need to resume D5W    Erik Burkett D. DanMina MarbleharmD, BCPS Pager:  [phone]/27/2018, 11:01 AM

## 2017-08-15 NOTE — Progress Notes (Signed)
[facility] PROGRESS NOTE  Date of Admission: 07/23/2017 Date of Consult: 07/23/2017 Referring Provider: Dr. Andrey CampanileWilson, CCS Chief Complaint: Abdominal pain  Summary: 61 yo male former smoker presented with severe abdominal pain and found to have free air in abdomen from perforated SB and ischemic cecum.  S/p resection of terminal ileum.  Required re-look for drainage of abdominal abscesses and evacuation of pelvic hematoma.  Past Medical History: ETOH, PE  Subjective:  Feels better this AM To go to CT for CT guided drainage of the abdomen  Vital signs: BP (!) 147/69   Pulse 99   Temp 99.5 F (37.5 C)   Resp (!) 30   Ht 5\' 10"  (1.778 m)   Wt 78.6 kg (173 lb 4.5 oz)   SpO2 99%   BMI 24.86 kg/m   Intake/Output: I/O last 3 completed shifts: In: 6817.8 [I.V.:6451.8; Other:66; IV Piggyback:300] Out: 5411 [Urine:4365; Drains:171; Stool:875]  Physical Exam: General - Pleasant male, NAD HEENT  - [place]/AT, PERRL, EOM-I and MMM Cardiac - IRIR, Nl S1/S2 and -M/R/G. Chest - CTA bilaterally Abd - Wound dressing in place, hypoactive bowel Ext - -edema and -tenderness Skin - Intact other than surgical wound Neuro - Alert and interactive, moving all ext to command Psych - normal mood  Discussion: 61 yo male with SB perforation, cecal ischemia complicated by sepsis with peritonitis, E coli bacteremia, multiple abdominal abscess with E coli, and acute renal failure.  Not candidate for further surgical intervention.  Assessment/Plan:  Acute hypoxic respiratory failure after cardiac arrest 12/19 - resolved. - Monitor respiratory status with airway protection specially with conscious sedation. - Titrate O2 for sat of 88-92%.  Peritonitis 2nd to SB perforation and cecal ischemia. Severe protein calorie malnutrition. - TPN in place - CT guided drainage of intraabdominal abscess, not a surgical candidate any further.  CT to be done today.  Sepsis from peritonitis, E coli bacteremia,  multiple abdominal abscesses with E coli. - Has been on Abx since 12/04 - Continue Zosyn  Chest pain with bradycardia 12/24 - s/p LHC and temp pacer placement 12/25. A fib with RVR. Hypertension. - Cardiology following appreciate input - Turn pacer down to 40 and see if a temp-perm pacer is needed  Acute renal failure from ATN. Hypokalemia - s/p repletion AM 12/26. NAGMA. - Monitor renal function - Continue bicarb drip for non-AG metabolic acidosis from hyperchloremia  Anemia of critical illness. - F/u CBC  Hyperglycemia. - SSI  DVT prophylaxis: SQ heparin SUP: Protonix Diet: TPN Goals of care: Full code.  Sister updated 12/20.  Pt has 2 daughters, but they don't have close relationship.  Will ask palliative care team to assist with discussions about goals of care.  Hold in the ICU given pacer needs and close monitoring.  Tx abx and CT of the abdomen for ?CT guided drainage.  The patient is critically ill with multiple organ systems failure and requires high complexity decision making for assessment and support, frequent evaluation and titration of therapies, application of advanced monitoring technologies and extensive interpretation of multiple databases.   Critical Care Time devoted to patient care services described in this note is  35  Minutes. This time reflects time of care of this signee Dr Koren BoundWesam Mylez Venable. This critical care time does not reflect procedure time, or teaching time or supervisory time of PA/NP/Med student/Med Resident etc but could involve care discussion time.  Alyson ReedyWesam G. Darelle Kings, M.D. [facility]. Pager: [phone]. After hours pager: [phone]. FLOW SHEET  Cultures: Blood 12/04: E coli Blood 12/10: negative Surgical wound 12/12:  Pan sensitive E. Coli Surgical drain 12/20: E coli  Antibiotics: Vancomycin 12/04 >> 12/04 Zosyn 12/04 >> 12/13 Anidulafungin 12/04 >> 12/17 Meropenem 12/14 >> 12/17 Zosyn 12/17 >>  Anidulafungin  12/19 >> 12/24  Vanco 12/19 >> 12/24  Studies: CT abd/pelvis 12/04 >> free air, SB pneumatosis, b/l inguinal hernias, b/l renal cysts Echo 12/05 >> EF 45 to 50%, mild LVH, grade 1 DD CT abd/pelvis 12/12 >> extraluminal contrast, ascites, abscess Lt hepatic lobe area, RLL consolidation, b/l effusions CT chest/abd/pelvis 12/19 >> pelvic hematoma liquefaction, perisplenic abscess, atelectasis LHC 12/25 > normal coronaries, TV pacer placed with backup rate of 40bpm.  Events: 12/04 Admit 12/05 Start CRRT 12/06 Ileostomy, closure of abdominal wound 12/08 Off pressors 12/09 Off CRRT 12/12 Laparotomy for abscess, evacuation of hematoma  12/16 Off precedex  12/18 Bicarb added for NG acidosis, tx 1 unit PRBC 12/19 Decompensated, PEA arrest > intubated 12/20 IR drainage of LUQ abscess 12/25 > [facility] and temp venous pacer placement  Lines/tubes: Lt [place] CVL 12/04 >> 12/16 ETT 12/04 >> 12/04 RUE PICC 12/16 >>  ETT 12/20 >> 12/24 R IJ temp venous pacer 12/26 >   Consults: 12/05 Renal, s/o 12/12 12/19 Wound care 12/20 IR  12/25 Cardiology 12/26 Palliative  Resolved Problems: Septic shock, PEA cardiac arrest, respiratory failure with inability to protect the airway  Labs: CMP Latest Ref Rng & Units 08/14/2017 08/13/2017 08/13/2017  Glucose 65 - 99 mg/dL 952(W124(H) - 413(K133(H)  BUN 6 - 20 mg/dL 44(W47(H) - 10(U48(H)  Creatinine 0.61 - 1.24 mg/dL 7.25(D2.21(H) 6.64(Q2.31(H) 0.34(V2.27(H)  Sodium 135 - 145 mmol/L 144 - 144  Potassium 3.5 - 5.1 mmol/L 3.2(L) - 3.1(L)  Chloride 101 - 111 mmol/L 122(H) - 122(H)  CO2 22 - 32 mmol/L 17(L) - 18(L)  Calcium 8.9 - 10.3 mg/dL 4.2(V8.4(L) - 8.2(L)  Total Protein 6.5 - 8.1 g/dL - - -  Total Bilirubin 0.3 - 1.2 mg/dL - - -  Alkaline Phos 38 - 126 U/L - - -  AST 15 - 41 U/L - - -  ALT 17 - 63 U/L - - -    CBC Latest Ref Rng & Units 08/15/2017 08/14/2017 08/13/2017  WBC 4.0 - 10.5 K/uL 18.3(H) 14.2(H) 13.1(H)  Hemoglobin 13.0 - 17.0 g/dL 9.0(L) 8.2(L) 7.6(L)   Hematocrit 39.0 - 52.0 % 28.2(L) 25.5(L) 23.9(L)  Platelets 150 - 400 K/uL 587(H) 523(H) 480(H)    CBG (last 3)  Recent Labs    08/14/17 1834 08/15/17 0051 08/15/17 0625  GLUCAP 111* 118* 125*    Imaging: No results found.          Attending Note:  61 year old with extensive PMH who presented to PCCM with bradycardia that required a temp pacer placement that is required continuous pacing.  On exam, clear lungs with no edema bilaterally.  I reviewed CXR myself, catheter and pacer noted.  Will continue pacing for now.  Discussed with cards.  EP to see patient today, will likely need a perm pacer.  Will hold in the ICU today given need for pacing.  PCCM will continue to follow.  The patient is critically ill with multiple organ systems failure and requires high complexity decision making for assessment and support, frequent evaluation and titration of therapies, application of advanced monitoring technologies and extensive interpretation of multiple databases.   Critical Care Time devoted to patient care services described in this note  is  35  Minutes. This time reflects time of care of this signee Dr Koren BoundWesam Tayron Hunnell. This critical care time does not reflect procedure time, or teaching time or supervisory time of PA/NP/Med student/Med Resident etc but could involve care discussion time.  Alyson ReedyWesam G. Caliyah Sieh, M.D. [facility]. Pager: [phone]. After hours pager: [phone].

## 2017-08-15 NOTE — Progress Notes (Signed)
2 Days Post-Op   Subjective/Chief Complaint: Extubated awake alert in good spirits, denies abdominal pain   Objective: Vital signs in last 24 hours: Temp:  [99.1 F (37.3 C)-99.5 F (37.5 C)] 99.5 F (37.5 C) (12/27 0700) Pulse Rate:  [51-110] 99 (12/27 0700) Resp:  [16-34] 30 (12/27 0700) BP: (147-173)/(69-99) 147/69 (12/27 0700) SpO2:  [96 %-100 %] 99 % (12/27 0700) Weight:  [78.6 kg (173 lb 4.5 oz)] 78.6 kg (173 lb 4.5 oz) (12/27 0500) Last BM Date: 08/14/17  Intake/Output from previous day: 12/26 0701 - 12/27 0700 In: 4220.6 [I.V.:4034.6; IV Piggyback:150] Out: 3056 [Urine:2690; Drains:91; Stool:275] Intake/Output this shift: No intake/output data recorded.  Incision/Wound:wound open and necrotic with chronic dehiscence ostomy viable working  Left JP purulent  Right drains one is purulent the other is serous Abdomen not tender, mildly distended, edematous/anasarca  Lab Results:  Recent Labs    08/14/17 0418 08/15/17 0625  WBC 14.2* 18.3*  HGB 8.2* 9.0*  HCT 25.5* 28.2*  PLT 523* 587*   BMET Recent Labs    08/13/17 1055 08/13/17 1430 08/14/17 0418  NA 144  --  144  K 3.1*  --  3.2*  CL 122*  --  122*  CO2 18*  --  17*  GLUCOSE 133*  --  124*  BUN 48*  --  47*  CREATININE 2.27* 2.31* 2.21*  CALCIUM 8.2*  --  8.4*   PT/INR No results for input(s): LABPROT, INR in the last 72 hours. ABG No results for input(s): PHART, HCO3 in the last 72 hours.  Invalid input(s): PCO2, PO2  Studies/Results: No results found.  Anti-infectives: Anti-infectives (From admission, onward)   Start     Dose/Rate Route Frequency Ordered Stop   08/10/17 0500  vancomycin (VANCOCIN) 1,500 mg in sodium chloride 0.9 % 500 mL IVPB  Status:  Discontinued     1,500 mg 250 mL/hr over 120 Minutes Intravenous Every 48 hours 08/08/17 0728 08/12/17 1018   08/08/17 2200  anidulafungin (ERAXIS) 100 mg in sodium chloride 0.9 % 100 mL IVPB  Status:  Discontinued     100 mg 78 mL/hr  over 100 Minutes Intravenous Every 24 hours 08/08/17 0524 08/12/17 1018   08/08/17 0445  vancomycin (VANCOCIN) 1,500 mg in sodium chloride 0.9 % 500 mL IVPB     1,500 mg 250 mL/hr over 120 Minutes Intravenous  Once 08/08/17 0431 08/08/17 0659   08/08/17 0430  vancomycin (VANCOCIN) 1,250 mg in sodium chloride 0.9 % 250 mL IVPB  Status:  Discontinued     1,250 mg 166.7 mL/hr over 90 Minutes Intravenous  Once 08/08/17 0418 08/08/17 0428   08/08/17 0415  anidulafungin (ERAXIS) 200 mg in sodium chloride 0.9 % 200 mL IVPB     200 mg 78 mL/hr over 200 Minutes Intravenous  Once 08/08/17 0413 08/08/17 0816   08/05/17 1000  piperacillin-tazobactam (ZOSYN) IVPB 3.375 g     3.375 g 12.5 mL/hr over 240 Minutes Intravenous Every 8 hours 08/05/17 0946     08/02/17 1400  meropenem (MERREM) 1 g in sodium chloride 0.9 % 100 mL IVPB  Status:  Discontinued     1 g 200 mL/hr over 30 Minutes Intravenous Every 12 hours 08/02/17 1111 08/05/17 0945   07/29/17 0600  piperacillin-tazobactam (ZOSYN) IVPB 3.375 g  Status:  Discontinued     3.375 g 12.5 mL/hr over 240 Minutes Intravenous Every 8 hours 07/29/17 0518 08/02/17 1036   07/25/17 2000  vancomycin (VANCOCIN) IVPB 1000 mg/200 mL premix  Status:  Discontinued     1,000 mg 200 mL/hr over 60 Minutes Intravenous Every 48 hours 07/23/17 2042 07/24/17 0956   07/24/17 2200  piperacillin-tazobactam (ZOSYN) 3.375 g in dextrose 5 % 50 mL IVPB  Status:  Discontinued     3.375 g 100 mL/hr over 30 Minutes Intravenous Every 6 hours 07/24/17 2006 07/29/17 0518   07/24/17 2100  anidulafungin (ERAXIS) 100 mg in sodium chloride 0.9 % 100 mL IVPB  Status:  Discontinued     100 mg 78 mL/hr over 100 Minutes Intravenous Every 24 hours 07/23/17 2039 08/05/17 0945   07/24/17 0000  anidulafungin (ERAXIS) 100 mg in sodium chloride 0.9 % 100 mL IVPB  Status:  Discontinued     100 mg 78 mL/hr over 100 Minutes Intravenous Every 24 hours 07/23/17 2038 07/23/17 2039   07/23/17 2200   piperacillin-tazobactam (ZOSYN) IVPB 3.375 g  Status:  Discontinued     3.375 g 12.5 mL/hr over 240 Minutes Intravenous Every 8 hours 07/23/17 2043 07/24/17 2006   07/23/17 2100  anidulafungin (ERAXIS) 200 mg in sodium chloride 0.9 % 200 mL IVPB     200 mg 78 mL/hr over 200 Minutes Intravenous  Once 07/23/17 2038 07/24/17 0239   07/23/17 2100  vancomycin (VANCOCIN) 1,500 mg in sodium chloride 0.9 % 500 mL IVPB     1,500 mg 250 mL/hr over 120 Minutes Intravenous  Once 07/23/17 2041 07/23/17 2317   07/23/17 1515  piperacillin-tazobactam (ZOSYN) IVPB 3.375 g     3.375 g 100 mL/hr over 30 Minutes Intravenous  Once 07/23/17 1511 07/23/17 1611      Assessment/Plan: s/p Procedure(s): Temporary Pacemaker Insertion LEFT HEART CATH AND CORONARY ANGIOGRAPHY (N/A) Perforated distal small boweland ischemia of cecum 1.S/Pexploratory laparotomy, small bowel resection without anastomosis, placement of open wound VAC system 07/23/17 Dr. Gaynelle AduEric Wilson 2. S/p Reexploration of abdomen, ileocecectomy, creation of end ileostomy,closure of abdominal wall, 07/25/17, Dr. Almond LintFaera Byerly 3. Exploratory laparotomy, drainage of abdominal abscess and evacuation of pelvic hematoma, RUQ drain, and left lateral drain placement, 07/31/17, Dr. Glenna FellowsBenjamin Hoxworth (Findings: Subdiaphragmatic and subhepatic abscesses.Large organizing pelvic hematoma. Apparent necrotic tissue left lobe of the liver.) Sepsis/shock -resolved, wbc decreasing Bacteremia - Enterobacteriaceae + E Coli, on abx per ccm, awaiting additional drain cultures Cardiac arrest - Code Blue 08/08/17 - reintubated/pressors/increase of antibiotics Extubated 12/24, off pressors  Acute respiratory failure -per ccm Acute renal failure-CRRT discontinued 07/27/17 - creatinine continues to decrease Malnutrition/Deconditioning - Prealbumin <5last time checked, on tpn Anemia - stable  GI-needs continued dressing changes, wound isdehiscedbut not much  more to do is certainly at risk, continue drains will need repeat Abd/pelvis CT Today  Keep NPO DG:LOVFIEPPIR:Vancomycin 07/23/17- 07/24/17,restarted 12/20 day 1,Zosyn 07/23/17- 12/14,anidulafungin 07/23/17 - 12/17,restarted 12/20 day 1,Meropenem 12/14 - 12/17 =>>Zosyn restarted 12/17 =>>anidulafunginrestarted 12/20 =>> Vancomycin restarted 12/20 =>>  JJO:ACZYVT:SCDs - Heparin Follow-up:Dr. Gaynelle AduEric Wilson     LOS: 23 days    Stephanie Couphristopher M [facility] 08/15/2017

## 2017-08-15 NOTE — Progress Notes (Addendum)
Patient ID: Caleb LaundryFrederick Beagle, male   DOB: 07-29-1956, 61 y.o.   MRN: [id number]  This NP visited patient at the bedside as a follow up to  yesterday's GOCs meeting, and planned meeting with patient's sister/ Deseree Earlene Plateravis     Patient is weak and tachypnea. Abdomen is firm and distended.  Appears generally uncomfortable, is alert and oriented and denies pain.    Continued conversation at bedside  regarding diagnosis, prognosis, medical complexity of the situation, importance of documentation of healthcare power of attorney and advanced directives.  A detailed discussion was had today regarding advanced directives.  Concepts specific to code status, was had.   Values and goals of care important to patient and family were attempted to be elicited.   Patient made decision today DNR/DNI status.  He wishes to  continue to treat the treatable and remains hopeful for improvement  Wishes to secure healthcare power of attorney with the assistance of spiritual care.  Will write for consult   Questions and concerns addressed.   Family encouraged to call with questions or concerns.  PMT will continue to support holistically.  Discussed with patient and his family the importance of continued conversation with family and their  medical providers regarding overall plan of care and treatment options,  ensuring decisions are within the context of the patients values and GOCs.  Questions and concerns addressed   Discussed with bedside RN  Time in  1300         Time out  1345   Total time spent on the  unit was   45 minutes  Greater than 50% of the time was spent in counseling and coordination of care  Palliative medicine will continue to support holistically  Lorinda CreedMary Terrill Alperin NP  Palliative Medicine Team Team Phone # [phone] Pager [phone]

## 2017-08-15 NOTE — Progress Notes (Signed)
Progress Note  Patient Name: Caleb Singh Date of Encounter: 08/15/2017  Primary Cardiologist:   No primary care provider on file.   Subjective   He looks better today.  Denies any pain.  No SOB  Inpatient Medications    Scheduled Meds: . chlorhexidine gluconate (MEDLINE KIT)  15 mL Mouth Rinse BID  . Chlorhexidine Gluconate Cloth  6 each Topical Daily  . heparin  5,000 Units Subcutaneous Q8H  . hydrALAZINE  20 mg Intravenous Q4H  . insulin aspart  0-15 Units Subcutaneous Q6H  . mouth rinse  15 mL Mouth Rinse BID  . pantoprazole (PROTONIX) IV  40 mg Intravenous Q24H  . sodium chloride flush  10-40 mL Intracatheter Q12H  . sodium chloride flush  3 mL Intravenous Q12H  . sodium chloride flush  5 mL Intravenous Q8H   Continuous Infusions: . sodium chloride 250 mL (08/14/17 1655)  . piperacillin-tazobactam (ZOSYN)  IV Stopped (08/15/17 0501)  .  sodium bicarbonate (isotonic) infusion in sterile water 75 mL/hr at 08/14/17 2310  . TPN ADULT (ION) 85 mL/hr at 08/14/17 1713   PRN Meds: sodium chloride, acetaminophen, acetaminophen, iopamidol, morphine injection, nitroGLYCERIN, ondansetron (ZOFRAN) IV, sodium chloride flush, sodium chloride flush   Vital Signs    Vitals:   08/15/17 0422 08/15/17 0500 08/15/17 0600 08/15/17 0700  BP:  (!) 151/72 (!) 161/80 (!) 147/69  Pulse: 84 81 94 99  Resp: 16 (!) 33 (!) 29 (!) 30  Temp: 99.3 F (37.4 C) 99.3 F (37.4 C) 99.3 F (37.4 C) 99.5 F (37.5 C)  TempSrc:      SpO2: 100% 97% 99% 99%  Weight:  173 lb 4.5 oz (78.6 kg)    Height:  '5\' 10"'$  (1.778 m)      Intake/Output Summary (Last 24 hours) at 08/15/2017 0738 Last data filed at 08/15/2017 0600 Gross per 24 hour  Intake 4220.58 ml  Output 3056 ml  Net 1164.58 ml   Filed Weights   08/13/17 0500 08/14/17 0600 08/15/17 0500  Weight: 176 lb 5.9 oz (80 kg) 167 lb 12.3 oz (76.1 kg) 173 lb 4.5 oz (78.6 kg)    Telemetry    Sinus tach still with intermittent pacing.  -  Personally Reviewed  ECG    NA - Personally Reviewed  Physical Exam   GEN: No  acute distress.   Neck: No  JVD Cardiac: RRR, no murmurs, rubs, or gallops.  Respiratory: Clear   to auscultation bilaterally. GI:  Decreased bowel sounds with distended abdomen MS:    No edema; No deformity. Neuro:   Nonfocal  Psych: Oriented and appropriate   Labs    Chemistry Recent Labs  Lab 08/10/17 0415  08/12/17 0354 08/12/17 1030  08/13/17 1055 08/13/17 1430 08/14/17 0418  NA 146*   < > 125* 142  --  144  --  144  K 3.2*   < > 3.9 3.7  --  3.1*  --  3.2*  CL 118*   < > 105 121*  --  122*  --  122*  CO2 23   < > 19* 21*  --  18*  --  17*  GLUCOSE 124*   < > 1,107* 126*  --  133*  --  124*  BUN 70*   < > 49* 52*  --  48*  --  47*  CREATININE 2.53*   < > 2.26* 2.41*   < > 2.27* 2.31* 2.21*  CALCIUM 7.6*   < > 7.1*  8.4*  --  8.2*  --  8.4*  PROT 6.6  --  5.5* 7.1  --   --   --   --   ALBUMIN 1.6*  --  1.5* 1.6*  --   --   --   --   AST 33  --  50* 49*  --   --   --   --   ALT 26  --  27 38  --   --   --   --   ALKPHOS 81  --  86 126  --   --   --   --   BILITOT 2.0*  --  0.7 2.3*  --   --   --   --   GFRNONAA 25*   < > 29* 27*   < > 29* 28* 30*  GFRAA 29*   < > 34* 31*   < > 34* 33* 35*  ANIONGAP 5   < > 1* 0*  --  4*  --  5   < > = values in this interval not displayed.     Hematology Recent Labs  Lab 08/13/17 1430 08/14/17 0418 08/15/17 0625  WBC 13.1* 14.2* 18.3*  RBC 2.62* 2.78* 3.13*  HGB 7.6* 8.2* 9.0*  HCT 23.9* 25.5* 28.2*  MCV 91.2 91.7 90.1  MCH 29.0 29.5 28.8  MCHC 31.8 32.2 31.9  RDW 15.1 15.6* 15.5  PLT 480* 523* 587*    Cardiac Enzymes Recent Labs  Lab 08/08/17 1120 08/13/17 0447 08/13/17 1055  TROPONINI 0.17* 0.10* 0.10*   No results for input(s): TROPIPOC in the last 168 hours.   BNPNo results for input(s): BNP, PROBNP in the last 168 hours.   DDimer No results for input(s): DDIMER in the last 168 hours.   Radiology    No results  found.  Cardiac Studies   ECHO:    Study Conclusions  - Left ventricle: The cavity size was normal. Wall thickness was   increased in a pattern of mild LVH. Systolic function was mildly   reduced. The estimated ejection fraction was in the range of 45%to 50%. Doppler parameters are consistent with abnormal left ventricular relaxation (grade 1 diastolic dysfunction).   Patient Profile     61 y.o. male without prior cardiac history.  He has had SBO and several surgeries.  We were called yesterday because of continue bradycardiac episodes.  Required temporary wire.    Assessment & Plan    BRADYCARDIA:  Episodes of bradycardia intermittently still requiring pacing. I spoke with surgery this morning.  They are not planning any further surgical procedures although he still needs percutaneous drainage and they are planning a CT to evaluate.  Still needs temp wire.  Might need to turn this off to further judge his bradycardia.  EP to see to consider temp perm pacing  HTN:   Hydralazine added and increased yesterday. BP is still slightly elevated. Holding ACE/ARB with increased creat.  Holding beta blocker.   CARDIOMYOPATHY:  Mildly reduced EF.  No ischemia.  Euvolemic.  ELEVATED TROPONIN:  Normal coronaries.  Demand ischemia.    AKI:  Creat is stable slowly coming down.    For questions or updates, please contact Allison Please consult [email or web address] for contact info under Cardiology/STEMI.   Signed, Minus Breeding, MD  08/15/2017, 7:38 AM

## 2017-08-15 NOTE — Progress Notes (Signed)
Occupational Therapy Treatment Patient Details Name: Caleb LaundryFrederick Sikkema MRN: [id number] DOB: November 07, 1955 Today's Date: 08/15/2017    History of present illness Pt is a 61 yo male admitted 07/23/17 with intestinal perforation. Now s/p resection & ileostomy on 12/5 and 12/6; s/p abscess draining 12/12. VDRF. A fib with RVR. ETT 12/20 >> 12/24. 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 PMH includes ETOH abuse, PE.    OT comments  Pt fatigued today and deferred OOB or EOB activity.  Agreeable to bed in chair position, and performed bil. UE and LE exercises.  Will continue to follow.   Follow Up Recommendations  SNF    Equipment Recommendations  3 in 1 bedside commode    Recommendations for Other Services      Precautions / Restrictions Precautions Precautions: Fall Precaution Comments: Multiple lines       Mobility Bed Mobility                  Transfers                      Balance                                           ADL either performed or assessed with clinical judgement   ADL                                               Vision       Perception     Praxis      Cognition Arousal/Alertness: Awake/alert Behavior During Therapy: WFL for tasks assessed/performed Overall Cognitive Status: Within Functional Limits for tasks assessed                                          Exercises Exercises: General Upper Extremity;General Lower Extremity General Exercises - Upper Extremity Shoulder Flexion: AROM;Both;10 reps;Supine Shoulder ABduction: AROM;Both;10 reps;Supine Elbow Flexion: AROM;Both;10 reps;Supine Elbow Extension: AROM;Both;10 reps;Supine General Exercises - Lower Extremity Ankle Circles/Pumps: AROM;Both;10 reps;Supine Hip ABduction/ADduction: AROM;Right;Left;10 reps;Supine Straight Leg Raises: AROM;Right;Left;10 reps;Supine    Shoulder Instructions       General Comments Pt very fatigued today.  He deferred EOB or OOB activity.  He was agreeable to bed in chair position and bil. UE and LE exercises     Pertinent Vitals/ Pain       Pain Assessment: Faces Faces Pain Scale: Hurts little more Pain Location: abdomen with movement Pain Descriptors / Indicators: Sore Pain Intervention(s): Monitored during session  Home Living                                          Prior Functioning/Environment              Frequency  Min 2X/week        Progress Toward Goals  OT Goals(current goals can now be found in the care plan section)  Progress towards OT goals: Not progressing toward goals - comment     Plan Discharge plan remains  appropriate    Co-evaluation                 AM-PAC PT "6 Clicks" Daily Activity     Outcome Measure     Help from another person taking care of personal grooming?: A Lot Help from another person toileting, which includes using toliet, bedpan, or urinal?: Total Help from another person bathing (including washing, rinsing, drying)?: A Lot Help from another person to put on and taking off regular upper body clothing?: Total Help from another person to put on and taking off regular lower body clothing?: Total 6 Click Score: 7    End of Session    OT Visit Diagnosis: Muscle weakness (generalized) (M62.81)   Activity Tolerance Patient limited by fatigue   Patient Left in bed;with call bell/phone within reach;with family/visitor present   Nurse Communication Mobility status        Time: [id number] OT Time Calculation (min): 14 min  Charges: OT General Charges $OT Visit: 1 Visit OT Treatments $Therapeutic Exercise: 8-22 mins  Reynolds AmericanWendi Meyah Corle, [name] [phone]    Jeani HawkingConarpe, Armetta Henri M 08/15/2017, 3:16 PM

## 2017-08-15 NOTE — Progress Notes (Signed)
Nutrition Follow-up  DOCUMENTATION CODES:   Not applicable  INTERVENTION:   -Custom TPN per Pharmacy. Nutritional needs re-estimated post extubation. Current TPN meets 100% estimated calorie needs, 97% protein needs -Pt may need re-initiation of D5W infusion to provide free water in setting of hypernatremia, to meet hydration needs  -Follow for diet advancement as appropriate  NUTRITION DIAGNOSIS:   Inadequate oral intake related to inability to eat as evidenced by NPO status.  Continues but being addressed via TPN  GOAL:   Patient will meet greater than or equal to 90% of their needs  Met via TPN  MONITOR:   Vent status, Weight trends, Labs, Skin, I & O's, Other (Comment)(TPN regimen)  REASON FOR ASSESSMENT:   Ventilator    ASSESSMENT:   61 y.o. male with no significant past medical history complaining of severely worsening abdominal distention, pain, nonbloody, nonbilious, non-coffee-ground emesis and lack of bowel movements over the course of the last 3 days no BM in 48 hours, feels like he needs to deficate.  He denies prior abdominal surgeries, no fever or chills.  Patient is unsure if he is passing flatus.  He is never had any liver issues he does drink regularly but has never had any seizures or hallucinations when he does not drink, he has not had any alcohol in approximately 1 week.  He is never seen a gastroenterologist.   12/05 CRRT initiated 12/06 Ileostomy, closure of abdominal wound 12/09 CRRT stopped 12/12 Lap for abscess, evacuation of hematoma 12/19 Code Blue, cardiac arrest, Intubated 12/20 IR drainage of LUQ abscess 12/24 Extubated, Palliative Care Consulted 12/25 Transferred to [facility] for [facility] and R IJ temp venous pacer placement  Abdominal wound dehisced but no surgical plans at present CT abdomen pending for today to evaluate suspected abscess  NPO, NG tube removed, Custom TPN infusing at 85 ml/hr. TPN provides 114gm AA, 428g CHO, and 34gm lipids,  which equals to 2260 kCal, 2040 mL of fluid  Weight has fluctuated up and down but relatively stable since 12/20. Down since admission.   Labs: sodium 148, Creatinine 2.08, BUN 55 Meds: reviewed, sodium bicarb at 75 ml/hr  Diet Order:  Diet NPO time specified TPN ADULT (ION)  EDUCATION NEEDS:   No education needs have been identified at this time  Skin:  Skin Assessment: Skin Integrity Issues: Skin Integrity Issues:: Incisions, Stage II Stage II: scrotum Incisions: abdominal/surgical  Last BM:  12/26 via ileostomy  Height:   Ht Readings from Last 1 Encounters:  08/15/17 '5\' 10"'$  (1.778 m)    Weight:   Wt Readings from Last 1 Encounters:  08/15/17 173 lb 4.5 oz (78.6 kg)    Ideal Body Weight:  75.45 kg  BMI:  Body mass index is 24.86 kg/m.  Estimated Nutritional Needs:   Kcal:  2200-2400 kcals  Protein:  118-141 g  Fluid:  >/= 2.5 L   Kerman Passey MS, RD, LDN, CNSC [phone] Pager  [phone] Weekend/On-Call Pager

## 2017-08-15 NOTE — Consult Note (Signed)
Cardiology Consultation:   Patient ID: Caleb Singh; [id number]; 1955/11/25   Admit date: 07/23/2017 Date of Consult: 08/15/2017  Primary Care Provider: Patient, No Pcp Per Primary Cardiologist: [place] Primary Electrophysiologist:  New   Patient Profile:   Caleb Singh is a 61 y.o. male with no cardiac history who is being seen today for the evaluation of bradycardia at the request of Caleb Singh.  History of Present Illness:   Caleb Singh presented to the hospital on 12 4 with a perforated bowel.  He had a coli bacteremia.  He required CRRT and had an exploratory laparotomy for drainage of an abscess.  He also had a pelvic hematoma.  On 1220 he had a CODE BLUE for asystole.  He was intubated at that time.  This was preceded by a decline over the prior few days with continued intraperitoneal abscess that required drainage.  Per report, he had intermittent atrial fibrillation.  Of note, he did have pauses of up to 20 seconds.  He also has had 4-5-second pauses since that time.  On 12/25, he had a temporary pacing wire placed in his right IJ.  Since that time, he has been feeling much better.  He is no longer confused.  He continues to be on TPN.  History reviewed. No pertinent past medical history.  Past Surgical History:  Procedure Laterality Date  . I&D EXTREMITY Right 04/17/2016   Procedure: IRRIGATION AND DEBRIDEMENT RIGHT HAND;  Surgeon: Roseanne Kaufman, MD;  Location: [place];  Service: Orthopedics;  Laterality: Right;  . I&D EXTREMITY Right 04/19/2016   Procedure: REPEAT I&D RIGHT HAND;  Surgeon: Roseanne Kaufman, MD;  Location: [place];  Service: Orthopedics;  Laterality: Right;  . LAPAROTOMY N/A 07/23/2017   Procedure: EXPLORATORY LAPAROTOMY, SMALL BOWEL RESECTION;  Surgeon: Greer Pickerel, MD;  Location: [facility];  Service: General;  Laterality: N/A;  . LAPAROTOMY N/A 07/25/2017   Procedure: EXPLORATORY LAPAROSCOPY WITH ILEOCECTOMY, END ILEOSTOMY;  Surgeon: [name], MD;   Location: [facility];  Service: General;  Laterality: N/A;  PATIENT ABDOMINAL WOUND LEFT OPEN AND PACKED WITH BULKY DRESSING  . LAPAROTOMY N/A 07/31/2017   Procedure: EXPLORATORY LAPAROTOMY drainage of abdominal abcess;  Surgeon: Excell Seltzer, MD;  Location: [facility];  Service: General;  Laterality: N/A;  . LEFT HEART CATH AND CORONARY ANGIOGRAPHY N/A 08/13/2017   Procedure: LEFT HEART CATH AND CORONARY ANGIOGRAPHY;  Surgeon: Jolaine Artist, MD;  Location: [facility];  Service: Cardiovascular;  Laterality: N/A;     Home Medications:  Prior to Admission medications   Medication Sig Start Date End Date Taking? Authorizing Provider  clindamycin (CLEOCIN) 300 MG capsule Take 1 capsule (300 mg total) by mouth 3 (three) times daily. Patient not taking: Reported on 07/23/2017 04/21/16   Roseanne Kaufman, MD  oxyCODONE (OXY IR/ROXICODONE) 5 MG immediate release tablet Take 1-2 tablets (5-10 mg total) by mouth every 3 (three) hours as needed for moderate pain. Patient not taking: Reported on 07/23/2017 04/21/16   Roseanne Kaufman, MD    Inpatient Medications: Scheduled Meds: . alteplase  2 mg Intracatheter Once  . chlorhexidine gluconate (MEDLINE KIT)  15 mL Mouth Rinse BID  . Chlorhexidine Gluconate Cloth  6 each Topical Daily  . heparin  5,000 Units Subcutaneous Q8H  . hydrALAZINE  20 mg Intravenous Q4H  . insulin aspart  0-15 Units Subcutaneous Q6H  . mouth rinse  15 mL Mouth Rinse BID  . pantoprazole (PROTONIX) IV  40 mg Intravenous Q24H  . sodium chloride  flush  10-40 mL Intracatheter Q12H  . sodium chloride flush  3 mL Intravenous Q12H  . sodium chloride flush  5 mL Intravenous Q8H   Continuous Infusions: . sodium chloride 250 mL (08/14/17 1655)  . piperacillin-tazobactam (ZOSYN)  IV Stopped (08/15/17 1410)  .  sodium bicarbonate (isotonic) infusion in sterile water 75 mL/hr at 08/15/17 1254  . TPN ADULT (ION) 90 mL/hr at 08/15/17 1751   PRN Meds: sodium chloride,  acetaminophen, acetaminophen, iopamidol, morphine injection, nitroGLYCERIN, ondansetron (ZOFRAN) IV, sodium chloride flush, sodium chloride flush  Allergies:   No Known Allergies  Social History:   Social History   Socioeconomic History  . Marital status: Single    Spouse name: Not on Singh  . Number of children: Not on Singh  . Years of education: Not on Singh  . Highest education level: Not on Singh  Social Needs  . Financial resource strain: Not on Singh  . Food insecurity - worry: Not on Singh  . Food insecurity - inability: Not on Singh  . Transportation needs - medical: Not on Singh  . Transportation needs - non-medical: Not on Singh  Occupational History  . Not on Singh  Tobacco Use  . Smoking status: Former Research scientist (life sciences)  . Smokeless tobacco: Never Used  Substance and Sexual Activity  . Alcohol use: No  . Drug use: No  . Sexual activity: Not on Singh  Other Topics Concern  . Not on Singh  Social History Narrative  . Not on Singh    Family History:   Patient unaware of family history  ROS:  Please see the history of present illness.  ROS  All other ROS reviewed and negative.     Physical Exam/Data:   Vitals:   08/15/17 1700 08/15/17 1800 08/15/17 1810 08/15/17 1811  BP: (!) 153/86 (!) 153/84    Pulse: (!) 103 (!) 107 (!) 110 (!) 103  Resp: (!) 39 (!) 38 (!) 41 (!) 41  Temp:      TempSrc:      SpO2: 98% 96% 96% 96%  Weight:      Height:        Intake/Output Summary (Last 24 hours) at 08/15/2017 1849 Last data filed at 08/15/2017 1800 Gross per 24 hour  Intake 4777.33 ml  Output 2581 ml  Net 2196.33 ml   Filed Weights   08/13/17 0500 08/14/17 0600 08/15/17 0500  Weight: 176 lb 5.9 oz (80 kg) 167 lb 12.3 oz (76.1 kg) 173 lb 4.5 oz (78.6 kg)   Body mass index is 24.86 kg/m.  General:  Well nourished, well developed, in no acute distress HEENT: normal Lymph: no adenopathy Neck: no JVD Endocrine:  No thryomegaly Vascular: No carotid bruits; FA pulses 2+  bilaterally without bruits  Cardiac:  normal S1, S2; RRR; no murmur  Lungs:  clear to auscultation bilaterally, no wheezing, rhonchi or rales  Abd: soft, nontender, no hepatomegaly  Ext: no edema Musculoskeletal:  No deformities, BUE and BLE strength normal and equal Skin: warm and dry  Neuro:  CNs 2-12 intact, no focal abnormalities noted Psych:  Normal affect   EKG:  The EKG was personally reviewed and demonstrates:  Sinus rhythm Telemetry:  Telemetry was personally reviewed and demonstrates:  Sinus rhythm with intermittent V pacing  Relevant CV Studies: TTE - Left ventricle: The cavity size was normal. Wall thickness was   increased in a pattern of mild LVH. Systolic function was mildly   reduced. The estimated ejection fraction was in  the range of 45%   to 50%. Doppler parameters are consistent with abnormal left   ventricular relaxation (grade 1 diastolic dysfunction).  Cath Successful placement of RIJ tranvenous pacing wire in RV apex. Back-up rate set at 40 bpm with threshold 0.78m.  Normal coronary arteries.   LV pressure 148/12  Laboratory Data:  Chemistry Recent Labs  Lab 08/13/17 1055 08/13/17 1430 08/14/17 0418 08/15/17 0802  NA 144  --  144 148*  K 3.1*  --  3.2* 3.7  CL 122*  --  122* 121*  CO2 18*  --  17* 20*  GLUCOSE 133*  --  124* 142*  BUN 48*  --  47* 50*  CREATININE 2.27* 2.31* 2.21* 2.08*  CALCIUM 8.2*  --  8.4* 8.3*  GFRNONAA 29* 28* 30* 32*  GFRAA 34* 33* 35* 37*  ANIONGAP 4*  --  5 7    Recent Labs  Lab 08/12/17 0354 08/12/17 1030 08/15/17 0802  PROT 5.5* 7.1 6.6  ALBUMIN 1.5* 1.6* 1.4*  AST 50* 49* 375*  ALT 27 38 145*  ALKPHOS 86 126 142*  BILITOT 0.7 2.3* 1.2   Hematology Recent Labs  Lab 08/13/17 1430 08/14/17 0418 08/15/17 0625  WBC 13.1* 14.2* 18.3*  RBC 2.62* 2.78* 3.13*  HGB 7.6* 8.2* 9.0*  HCT 23.9* 25.5* 28.2*  MCV 91.2 91.7 90.1  MCH 29.0 29.5 28.8  MCHC 31.8 32.2 31.9  RDW 15.1 15.6* 15.5  PLT 480* 523*  587*   Cardiac Enzymes Recent Labs  Lab 08/13/17 0447 08/13/17 1055  TROPONINI 0.10* 0.10*   No results for input(s): TROPIPOC in the last 168 hours.  BNPNo results for input(s): BNP, PROBNP in the last 168 hours.  DDimer No results for input(s): DDIMER in the last 168 hours.  Radiology/Studies:  Ct Abdomen Pelvis Wo Contrast  Result Date: 08/15/2017 CLINICAL DATA:  61year old male with perforated small bowel and ischemic cecum post resection. Follow-up for evaluation of abdominal abscess/hematoma. Subsequent encounter. EXAM: CT ABDOMEN AND PELVIS WITHOUT CONTRAST TECHNIQUE: Multidetector CT imaging of the abdomen and pelvis was performed following the standard protocol without IV contrast. COMPARISON:  Several prior CTs, most recent 08/08/2017 and 08/07/2017. FINDINGS: Lower chest: Basilar atelectasis/infiltrates with small pleural effusions. Pacer leads right ventricle. Hepatobiliary: There are 2 drainage catheters surrounding the liver. Drainage catheter extending into collection along the anterior superior margin of the left lobe liver with decrease in size and mass effect of abscess now measuring 2 x 5.2 x 9 cm versus prior 3.2 x 6 x 9.8 cm. Catheter extending into the abscess along the inferior posterior aspect of the right lobe liver contains small amount a gas (possibly from flushing) and has overall similar dimensions to prior exam measuring 7.8 x 1.8 x 8 cm. Hyperdense material within the gallbladder. Pancreas: Taking into account limitation by non contrast imaging, no primary pancreatic abnormality. Spleen: Pigtail catheter placed for drainage of perisplenic/ subdiaphragmatic abscess. Significant decrease in size of perisplenic abscess now with maximal thickness of 1.1 cm versus prior 4.1 cm. Adrenals/Urinary Tract: No hydronephrosis. Right renal 2 cm cyst and possibly hyperdense left renal 1 cm cyst. No adrenal lesion. Gas in the urinary bladder may be related to recent manipulation.  Stomach/Bowel: Post surgery with staple line at level of proximal ascending colon. Colostomy right lower quadrant. Complex fluid collections within the abdomen and pelvis. The 2 largest fluid collections are in the lower right pericolic gutter (adjacent to suture line of proximal ascending colon) appearing slightly dense  and minimally more prominent than on the prior examination now measuring 10 x 4.2 x 4.4 cm and previously measuring 9.8 x 4 x 3.9 cm. Largest collection in the pelvis adjacent to the sigmoid colon as decreased in size now measuring 4.5 x 4.8 cm and previously measured 4.8 x 5 cm. Vascular/Lymphatic: Atherosclerotic changes aorta and iliac/femoral artery is without abdominal aortic aneurysm. Reactive lymph nodes. Reproductive: No acute abnormality. Other: Large anterior abdominal/pelvic incision site healing by secondary intent. Small amount of fluid extends into the inguinal canal. Mild third spacing of fluid. Musculoskeletal: Prominent degenerative changes lumbar spine without change. No surrounding soft tissue abnormality to suggest spread of infection to the thoracic spine. IMPRESSION: Post surgery with staple line at level of proximal ascending colon. Colostomy right lower quadrant. Pigtail catheter placed for drainage of perisplenic/ subdiaphragmatic abscess. Significant decrease in size of perisplenic abscess now with maximal thickness of 1.1 cm versus prior 4.1 cm. Drainage catheter extending into collection along the anterior superior margin of the left lobe liver with decrease in size and decreased mass effect of abscess which now measures 2 x 5.2 x 9 cm versus prior 3.2 x 6 x 9.8 cm. Catheter extending into the abscess along the inferior posterior aspect of the right lobe liver contains small amount a gas (possibly from flushing) and has overall similar dimensions to prior exam measuring 7.8 x 1.8 x 8 cm. Complex fluid collections within the abdomen and pelvis. The 2 largest fluid  collections are in the lower right pericolic gutter (adjacent to suture line of proximal ascending colon) appearing slightly dense and minimally more prominent than on the prior examination now measuring 10 x 4.2 x 4.4 cm and previously measuring 9.8 x 4 x 3.9 cm. Largest collection in the pelvis adjacent to the sigmoid colon has decreased in size now measuring 4.5 x 4.8 cm and previously measured 4.8 x 5 cm. Bibasilar atelectasis/infiltrates with small pleural effusions. Pacemaker leads in the region of the right ventricle. Electronically Signed   By: Genia Del M.D.   On: 08/15/2017 14:11    Assessment and Plan:   1. Asystole: Events appear to be vagal mediated.  He has sinus slowing followed by pauses of up to 20 seconds in the past.  He is requiring pacing with his transvenous pacemaker intermittently.  I have turned his pacemaker down to 30 bpm which will place him as needed.  Hopefully as his abdominal issues heal, he will no longer need pacing.  Will continue to watch him over the weekend without further EP procedures.  He is also on TPN and with a high risk of fungal infection, would hold off on any permanent wires in his vasculature.   For questions or updates, please contact [facility] Please consult [email or web address] for contact info under Cardiology/STEMI.   Signed, Will Meredith Leeds, MD  08/15/2017 6:49 PM

## 2017-08-16 ENCOUNTER — Encounter (HOSPITAL_COMMUNITY): Payer: Self-pay | Admitting: General Surgery

## 2017-08-16 LAB — BASIC METABOLIC PANEL
ANION GAP: 7 (ref 5–15)
BUN: 50 mg/dL — ABNORMAL HIGH (ref 6–20)
CALCIUM: 8.4 mg/dL — AB (ref 8.9–10.3)
CO2: 24 mmol/L (ref 22–32)
Chloride: 119 mmol/L — ABNORMAL HIGH (ref 101–111)
Creatinine, Ser: 1.98 mg/dL — ABNORMAL HIGH (ref 0.61–1.24)
GFR, EST AFRICAN AMERICAN: 40 mL/min — AB (ref >60)
GFR, EST NON AFRICAN AMERICAN: 34 mL/min — AB (ref >60)
Glucose, Bld: 132 mg/dL — ABNORMAL HIGH (ref 65–99)
POTASSIUM: 3.6 mmol/L (ref 3.5–5.1)
SODIUM: 150 mmol/L — AB (ref 135–145)

## 2017-08-16 LAB — GLUCOSE, CAPILLARY
GLUCOSE-CAPILLARY: 134 mg/dL — AB (ref 65–99)
Glucose-Capillary: 127 mg/dL — ABNORMAL HIGH (ref 65–99)

## 2017-08-16 LAB — CBC
HCT: 26.5 % — ABNORMAL LOW (ref 39.0–52.0)
Hemoglobin: 8.4 g/dL — ABNORMAL LOW (ref 13.0–17.0)
MCH: 28.8 pg (ref 26.0–34.0)
MCHC: 31.7 g/dL (ref 30.0–36.0)
MCV: 90.8 fL (ref 78.0–100.0)
PLATELETS: 568 10*3/uL — AB (ref 150–400)
RBC: 2.92 MIL/uL — AB (ref 4.22–5.81)
RDW: 16 % — AB (ref 11.5–15.5)
WBC: 17.4 10*3/uL — AB (ref 4.0–10.5)

## 2017-08-16 LAB — MAGNESIUM: MAGNESIUM: 2.1 mg/dL (ref 1.7–2.4)

## 2017-08-16 LAB — TRIGLYCERIDES: TRIGLYCERIDES: 227 mg/dL — AB (ref <150)

## 2017-08-16 LAB — PHOSPHORUS: PHOSPHORUS: 3.4 mg/dL (ref 2.5–4.6)

## 2017-08-16 MED ORDER — HEPARIN SODIUM (PORCINE) 5000 UNIT/ML IJ SOLN
5000.0000 [IU] | Freq: Three times a day (TID) | INTRAMUSCULAR | Status: DC
Start: 2017-08-17 — End: 2017-08-31
  Administered 2017-08-17 – 2017-08-31 (×42): 5000 [IU] via SUBCUTANEOUS
  Filled 2017-08-16 (×44): qty 1

## 2017-08-16 MED ORDER — HEPARIN SODIUM (PORCINE) 5000 UNIT/ML IJ SOLN: 5000.0000 [IU] | Freq: Three times a day (TID) | INTRAMUSCULAR | Status: DC

## 2017-08-16 MED ORDER — TRAVASOL 10 % IV SOLN
INTRAVENOUS | Status: AC
  Administered 2017-08-16: 18:00:00 via INTRAVENOUS
  Filled 2017-08-16: qty 1274.4

## 2017-08-16 NOTE — Progress Notes (Signed)
Progress Note  Patient Name: Caleb Singh Date of Encounter: 08/16/2017  Primary Cardiologist: new to Dr. Percival Spanish  Subjective   Denies SOB, sats 95% on RA while taking with me, wants to be able to start to eat, no CP, palpitations, no cardiac awareness, no dizziness   Inpatient Medications    Scheduled Meds: . alteplase  2 mg Intracatheter Once  . chlorhexidine gluconate (MEDLINE KIT)  15 mL Mouth Rinse BID  . Chlorhexidine Gluconate Cloth  6 each Topical Daily  . [START ON 08/17/2017] heparin  5,000 Units Subcutaneous Q8H  . hydrALAZINE  20 mg Intravenous Q4H  . insulin aspart  0-15 Units Subcutaneous Q6H  . mouth rinse  15 mL Mouth Rinse BID  . pantoprazole (PROTONIX) IV  40 mg Intravenous Q24H  . sodium chloride flush  10-40 mL Intracatheter Q12H  . sodium chloride flush  3 mL Intravenous Q12H  . sodium chloride flush  5 mL Intravenous Q8H   Continuous Infusions: . sodium chloride 250 mL (08/16/17 0800)  . piperacillin-tazobactam (ZOSYN)  IV Stopped (08/16/17 0505)  . TPN ADULT (ION) 90 mL/hr at 08/16/17 0800   PRN Meds: sodium chloride, acetaminophen, acetaminophen, iopamidol, morphine injection, nitroGLYCERIN, ondansetron (ZOFRAN) IV, sodium chloride flush, sodium chloride flush   Vital Signs    Vitals:   08/16/17 0600 08/16/17 0700 08/16/17 0744 08/16/17 0800  BP: (!) 144/82 (!) 153/85 (!) 153/85 137/76  Pulse: (!) 112 77 99 (!) 101  Resp: (!) 26 18 (!) 25 (!) 24  Temp:   97.9 F (36.6 C)   TempSrc:   Oral   SpO2: 98% 99% 100% 100%  Weight:      Height:        Intake/Output Summary (Last 24 hours) at 08/16/2017 0955 Last data filed at 08/16/2017 0800 Gross per 24 hour  Intake 4645.75 ml  Output 2554 ml  Net 2091.75 ml   Filed Weights   08/13/17 0500 08/14/17 0600 08/15/17 0500  Weight: 176 lb 5.9 oz (80 kg) 167 lb 12.3 oz (76.1 kg) 173 lb 4.5 oz (78.6 kg)    Telemetry    SR rates generally 80's-110, occasional periods of pacing,  5-10seconds at 30bpm - Personally Reviewed  ECG    No new EKGs - Personally Reviewed  Physical Exam   GEN: ill appearing, in no acute distress.   Neck: No JVD Cardiac: RRR, no significant murmurs, rubs, or gallops are appreciated Respiratory: CTA b/l ant/lat auscultation GI: not examined, large dressing in place, is clean and dry  MS: No edema; quite thin Neuro:  Nonfocal  Psych: Normal affect   Labs    Chemistry Recent Labs  Lab 08/12/17 0354 08/12/17 1030  08/14/17 0418 08/15/17 0802 08/16/17 0340  NA 125* 142   < > 144 148* 150*  K 3.9 3.7   < > 3.2* 3.7 3.6  CL 105 121*   < > 122* 121* 119*  CO2 19* 21*   < > 17* 20* 24  GLUCOSE 1,107* 126*   < > 124* 142* 132*  BUN 49* 52*   < > 47* 50* 50*  CREATININE 2.26* 2.41*   < > 2.21* 2.08* 1.98*  CALCIUM 7.1* 8.4*   < > 8.4* 8.3* 8.4*  PROT 5.5* 7.1  --   --  6.6  --   ALBUMIN 1.5* 1.6*  --   --  1.4*  --   AST 50* 49*  --   --  375*  --  ALT 27 38  --   --  145*  --   ALKPHOS 86 126  --   --  142*  --   BILITOT 0.7 2.3*  --   --  1.2  --   GFRNONAA 29* 27*   < > 30* 32* 34*  GFRAA 34* 31*   < > 35* 37* 40*  ANIONGAP 1* 0*   < > '5 7 7   '$ < > = values in this interval not displayed.     Hematology Recent Labs  Lab 08/14/17 0418 08/15/17 0625 08/16/17 0340  WBC 14.2* 18.3* 17.4*  RBC 2.78* 3.13* 2.92*  HGB 8.2* 9.0* 8.4*  HCT 25.5* 28.2* 26.5*  MCV 91.7 90.1 90.8  MCH 29.5 28.8 28.8  MCHC 32.2 31.9 31.7  RDW 15.6* 15.5 16.0*  PLT 523* 587* 568*    Cardiac Enzymes Recent Labs  Lab 08/13/17 0447 08/13/17 1055  TROPONINI 0.10* 0.10*   No results for input(s): TROPIPOC in the last 168 hours.   BNPNo results for input(s): BNP, PROBNP in the last 168 hours.   DDimer No results for input(s): DDIMER in the last 168 hours.   Radiology    Ct Abdomen Pelvis Wo Contrast Result Date: 08/15/2017 CLINICAL DATA:  61 year old male with perforated small bowel and ischemic cecum post resection. Follow-up for  evaluation of abdominal abscess/hematoma. Subsequent encounter. EXAM: CT ABDOMEN AND PELVIS WITHOUT CONTRAST TECHNIQUE: Multidetector CT imaging of the abdomen and pelvis was performed following the standard protocol without IV contrast. COMPARISON:  Several prior CTs, most recent 08/08/2017 and 08/07/2017. FINDINGS: Lower chest: Basilar atelectasis/infiltrates with small pleural effusions. Pacer leads right ventricle. Hepatobiliary: There are 2 drainage catheters surrounding the liver. Drainage catheter extending into collection along the anterior superior margin of the left lobe liver with decrease in size and mass effect of abscess now measuring 2 x 5.2 x 9 cm versus prior 3.2 x 6 x 9.8 cm. Catheter extending into the abscess along the inferior posterior aspect of the right lobe liver contains small amount a gas (possibly from flushing) and has overall similar dimensions to prior exam measuring 7.8 x 1.8 x 8 cm. Hyperdense material within the gallbladder. Pancreas: Taking into account limitation by non contrast imaging, no primary pancreatic abnormality. Spleen: Pigtail catheter placed for drainage of perisplenic/ subdiaphragmatic abscess. Significant decrease in size of perisplenic abscess now with maximal thickness of 1.1 cm versus prior 4.1 cm. Adrenals/Urinary Tract: No hydronephrosis. Right renal 2 cm cyst and possibly hyperdense left renal 1 cm cyst. No adrenal lesion. Gas in the urinary bladder may be related to recent manipulation. Stomach/Bowel: Post surgery with staple line at level of proximal ascending colon. Colostomy right lower quadrant. Complex fluid collections within the abdomen and pelvis. The 2 largest fluid collections are in the lower right pericolic gutter (adjacent to suture line of proximal ascending colon) appearing slightly dense and minimally more prominent than on the prior examination now measuring 10 x 4.2 x 4.4 cm and previously measuring 9.8 x 4 x 3.9 cm. Largest collection in the  pelvis adjacent to the sigmoid colon as decreased in size now measuring 4.5 x 4.8 cm and previously measured 4.8 x 5 cm. Vascular/Lymphatic: Atherosclerotic changes aorta and iliac/femoral artery is without abdominal aortic aneurysm. Reactive lymph nodes. Reproductive: No acute abnormality. Other: Large anterior abdominal/pelvic incision site healing by secondary intent. Small amount of fluid extends into the inguinal canal. Mild third spacing of fluid. Musculoskeletal: Prominent degenerative changes lumbar spine without  change. No surrounding soft tissue abnormality to suggest spread of infection to the thoracic spine. IMPRESSION: Post surgery with staple line at level of proximal ascending colon. Colostomy right lower quadrant. Pigtail catheter placed for drainage of perisplenic/ subdiaphragmatic abscess. Significant decrease in size of perisplenic abscess now with maximal thickness of 1.1 cm versus prior 4.1 cm. Drainage catheter extending into collection along the anterior superior margin of the left lobe liver with decrease in size and decreased mass effect of abscess which now measures 2 x 5.2 x 9 cm versus prior 3.2 x 6 x 9.8 cm. Catheter extending into the abscess along the inferior posterior aspect of the right lobe liver contains small amount a gas (possibly from flushing) and has overall similar dimensions to prior exam measuring 7.8 x 1.8 x 8 cm. Complex fluid collections within the abdomen and pelvis. The 2 largest fluid collections are in the lower right pericolic gutter (adjacent to suture line of proximal ascending colon) appearing slightly dense and minimally more prominent than on the prior examination now measuring 10 x 4.2 x 4.4 cm and previously measuring 9.8 x 4 x 3.9 cm. Largest collection in the pelvis adjacent to the sigmoid colon has decreased in size now measuring 4.5 x 4.8 cm and previously measured 4.8 x 5 cm. Bibasilar atelectasis/infiltrates with small pleural effusions. Pacemaker  leads in the region of the right ventricle. Electronically Signed   By: Genia Del M.D.   On: 08/15/2017 14:11    Cardiac Studies   08/13/17: LCH/temp pacing wire Successful placement of RIJ tranvenous pacing wire in RV apex. Back-up rate set at 40 bpm with threshold 0.55m. Normal coronary arteries.  LV pressure 148/12  07/24/17: TTE Study Conclusions - Left ventricle: The cavity size was normal. Wall thickness was   increased in a pattern of mild LVH. Systolic function was mildly   reduced. The estimated ejection fraction was in the range of 45%   to 50%. Doppler parameters are consistent with abnormal left   ventricular relaxation (grade 1 diastolic dysfunction).  Patient Profile     61y.o. male with PMHx of remote PE, record mentions ETOH abuse, nothing otherwise admitted 07/23/17 with abd pain found with perforated bowel and emergently taken to OR.  Hospital course has been complicated with  Peritonitis, Ecoli bacteremia septic shock,hypotension requiring pressors ARF requiring CRRT Re-operated 12/6 for re-exploration, ileostomy, ilieocecotomy, and closure of abdomen 07/27/17 off pressors Intermittent need for PRBC 07/29/17 reports of rapid AFib >> cardizem gtt >> pauses 07/29/17 CRRT held with urine out put  07/30/17 febrile, unable to wean from vent 07/31/17 re-operated, drainage of abscess and pelvic hematoma                 rare gram-positive rods, moderate gram-negative rods 12/13//18 rapid AFib, esmolol 08/02/17 extubated, lopressor added, encephalopathic/delerium 08/07/17 evening Event RN note reported 31 second followed 25 second asystolic event (pt unresponsive), both events associated with patient using incentive spirometer 08/08/17  01:53PEA arrest occured after a few days of reported gradual decline in status, re-intubated                Note reports pt became apnic 08/08/17: IR CT guided drain placement for abdominal abscess 08/10/17 anemia requiring  PRBC 08/12/17 extubated 08/13/17 cardiology consulted for 4-5 second pauses, seemed to correlate with stimulation  Such as suction/bathing, brady felt to be vagal 08/13/17 LHC w/normal coronaries, temp wire placement 08/15/17 EP consulted with intermittent pacing noted, temp pacing turned down to 30bpm  08/15/17: surgery describes incision as open and necrotic with chronic dehiscence ostomy viable working, Left JP purulent, Right drains one is purulent the other is serous  Currently on: Zosyn TPN Hydralazine protonix  LABS today WBC 17.4 H/H 8/26 Creat 1.98   Assessment & Plan    1. Bradycardia, sinus node dysfunction     At extremely high risk for infection complications with PPM     Temp wire appears to be working well      Intermittently still pacing 5-10 seconds at a time, unable to establish pattern, clear trigger, some events have preceding sinus slowing  Dr. Rayann Heman will see, plan to monitor over the weekend prior to decision on a temp-perm device.     For questions or updates, please contact [place] Please consult [email or web address] for contact info under Cardiology/STEMI.      Signed, Baldwin Jamaica, PA-C  08/16/2017, 9:55 AM    I have seen, examined the patient, and reviewed the above assessment and plan.  Changes to above are made where necessary.   Ill appearing male.  NAD.  Very difficult situation.  Not currently a candidate for EP procedures.  Hopefully, we can get his temp wire out over the weekend as he recovers from his GI illness.  I dont think that there is benefit from temp/perm pacing currently.  EP to follow over the weekend.  Co Sign: Thompson Grayer, MD 08/16/2017 4:38 PM

## 2017-08-16 NOTE — Progress Notes (Signed)
[place] PCCM PROGRESS NOTE  Date of Admission: 07/23/2017 Date of Consult: 07/23/2017 Referring Provider: Dr. Andrey CampanileWilson, CCS Chief Complaint: Abdominal pain  Summary: 61 yo male former smoker presented with severe abdominal pain and found to have free air in abdomen from perforated SB and ischemic cecum.  S/p resection of terminal ileum.  Required re-look for drainage of abdominal abscesses and evacuation of pelvic hematoma.  Past Medical History: ETOH, PE  Subjective:  No complaints this morning, states he is overall feeling well and improved since transfer  CT A/P 12/27 with multiple complicated fluid collections parasplenic and perihepatic fluid collections stable/decreased though now with multiple large abdominal/pelvic collections  Vital signs: BP 137/76   Pulse (!) 101   Temp 97.9 F (36.6 C) (Oral)   Resp (!) 24   Ht 5\' 10"  (1.778 m)   Wt 173 lb 4.5 oz (78.6 kg)   SpO2 100%   BMI 24.86 kg/m   Intake/Output: I/O last 3 completed shifts: In: 6930.8 [P.O.:500; I.V.:6185.8; Other:45; IV Piggyback:200] Out: 3890 [Urine:3035; Drains:155; Stool:700]  Physical Exam: General - Pleasant male, NAD HEENT  - [place]/AT, EOM-I and MMM Cardiac - Nl S1/S2 and -M/R/G. Chest - CTA bilaterally Abd - Wound dressing in place Ext - -edema and -tenderness Skin - Intact other than surgical wound Neuro - Alert and interactive, moving all ext to command Psych - normal mood  Discussion: 61 yo male with SB perforation, cecal ischemia complicated by sepsis with peritonitis, E coli bacteremia, multiple abdominal abscess with E coli, and acute renal failure.  Not candidate for further surgical intervention.  Assessment/Plan:  Acute hypoxic respiratory failure after cardiac arrest 12/19 - resolved. - Monitor respiratory status with airway protection specially with conscious sedation. - Titrate O2 for sat of 88-92%.  Peritonitis 2nd to SB perforation and cecal ischemia. Severe protein calorie  malnutrition. - TPN in place - CT 12/27 with multiple persistent complicated abdominal fluid collections though stable/decreased from prior - IR consulted by Surgery 12/28 to consider additional percutaneous drains  Sepsis from peritonitis, E coli bacteremia, multiple abdominal abscesses with E coli. - Has been on Abx since 12/04 - Continue Zosyn, no other positive cultures aside from E Coli  Chest pain with bradycardia 12/24 - s/p LHC and temp pacer placement 12/25. A fib with RVR. Hypertension. - Cardiology following appreciate input - overnight had multiple episodes of sinus pause with paced rhythm for on average 3-5 beats.  Will follow up with Cardiology regarding need for permanent pacemaker  Acute renal failure from ATN. Hypokalemia - s/p repletion AM 12/26. NAGMA. - Monitor renal function - non-AG metabolic acidosis from hyperchloremia no resolved, sodium bicarb discontinued 12/28  Anemia of critical illness. - F/u CBC  Hyperglycemia. - SSI  DVT prophylaxis: SQ heparin SUP: Protonix Diet: TPN Goals of care: Full code.  Sister updated 12/20.  Pt has 2 daughters, but they don't have close relationship.   12/27 - seen by Palliative care and after discussion with patient is now DNR/DNI though otherwise wishes to continue current treatments  Dispo: Follow up with Cardiology regarding need for pacemaker, if not can be transferred to stepdown  The patient is critically ill with multiple organ systems failure and requires high complexity decision making for assessment and support, frequent evaluation and titration of therapies, application of advanced monitoring technologies and extensive interpretation of multiple databases.   Critical Care Time devoted to patient care services described in this note is 25  Minutes. This time reflects time of care  of this signee Dr [place] Chales Pelissier. This critical care time does not reflect procedure time, or teaching time or supervisory time of  PA/NP/Med student/Med Resident etc but could involve care discussion time.  [place] Rossi Silvestro, MD [facility] Pulmonology/Critical Care Pager [phone]  After hours pager: [phone]. FLOW SHEET  Cultures: Blood 12/04: E coli Blood 12/10: negative Surgical wound 12/12:  Pan sensitive E. Coli Surgical drain 12/20: E coli  Antibiotics: Vancomycin 12/04 >> 12/04 Zosyn 12/04 >> 12/13 Anidulafungin 12/04 >> 12/17 Meropenem 12/14 >> 12/17 Zosyn 12/17 >>  Anidulafungin 12/19 >> 12/24  Vanco 12/19 >> 12/24  Studies: CT abd/pelvis 12/04 >> free air, SB pneumatosis, b/l inguinal hernias, b/l renal cysts Echo 12/05 >> EF 45 to 50%, mild LVH, grade 1 DD CT abd/pelvis 12/12 >> extraluminal contrast, ascites, abscess Lt hepatic lobe area, RLL consolidation, b/l effusions CT chest/abd/pelvis 12/19 >> pelvic hematoma liquefaction, perisplenic abscess, atelectasis LHC 12/25 > normal coronaries, TV pacer placed with backup rate of 40bpm. CT A/P 12/27 >multiple complicated fluid collections parasplenic and perihepatic fluid collections stable/decreased though now with multiple large abdominal/pelvic collections  Events: 12/04 Admit 12/05 Start CRRT 12/06 Ileostomy, closure of abdominal wound 12/08 Off pressors 12/09 Off CRRT 12/12 Laparotomy for abscess, evacuation of hematoma  12/16 Off precedex  12/18 Bicarb added for NG acidosis, tx 1 unit PRBC 12/19 Decompensated, PEA arrest > intubated 12/20 IR drainage of LUQ abscess 12/25 > transferred to [facility] for LHC and temp venous pacer placement 12/27 PPM set to VVI rate 30  Lines/tubes: Lt [place] CVL 12/04 >> 12/16 ETT 12/04 >> 12/04 RUE PICC 12/16 >>  ETT 12/20 >> 12/24 R IJ temp venous pacer 12/26 >   Consults: 12/05 Renal, s/o 12/12 12/19 Wound care 12/20 IR  12/25 Cardiology 12/26 Palliative  Resolved Problems: Septic shock, PEA cardiac arrest, respiratory failure with inability to protect the airway  Labs: CMP Latest Ref Rng & Units  08/16/2017 08/15/2017 08/14/2017  Glucose 65 - 99 mg/dL 478(G) 956(O) 130(Q)  BUN 6 - 20 mg/dL 65(H) 84(O) 96(E)  Creatinine 0.61 - 1.24 mg/dL 9.52(W) 4.13(K) 4.40(N)  Sodium 135 - 145 mmol/L 150(H) 148(H) 144  Potassium 3.5 - 5.1 mmol/L 3.6 3.7 3.2(L)  Chloride 101 - 111 mmol/L 119(H) 121(H) 122(H)  CO2 22 - 32 mmol/L 24 20(L) 17(L)  Calcium 8.9 - 10.3 mg/dL 0.2(V) 8.3(L) 8.4(L)  Total Protein 6.5 - 8.1 g/dL - 6.6 -  Total Bilirubin 0.3 - 1.2 mg/dL - 1.2 -  Alkaline Phos 38 - 126 U/L - 142(H) -  AST 15 - 41 U/L - 375(H) -  ALT 17 - 63 U/L - 145(H) -    CBC Latest Ref Rng & Units 08/16/2017 08/15/2017 08/14/2017  WBC 4.0 - 10.5 K/uL 17.4(H) 18.3(H) 14.2(H)  Hemoglobin 13.0 - 17.0 g/dL 2.5(D) 6.6(Y) 4.0(H)  Hematocrit 39.0 - 52.0 % 26.5(L) 28.2(L) 25.5(L)  Platelets 150 - 400 K/uL 568(H) 587(H) 523(H)    CBG (last 3)  Recent Labs    08/15/17 1824 08/15/17 2343 08/16/17 0416  GLUCAP 125* 121* 127*    Imaging: Ct Abdomen Pelvis Wo Contrast  Result Date: 08/15/2017 CLINICAL DATA:  61 year old male with perforated small bowel and ischemic cecum post resection. Follow-up for evaluation of abdominal abscess/hematoma. Subsequent encounter. EXAM: CT ABDOMEN AND PELVIS WITHOUT CONTRAST TECHNIQUE: Multidetector CT imaging of the abdomen and pelvis was performed following the standard protocol without IV contrast. COMPARISON:  Several prior CTs, most recent 08/08/2017 and 08/07/2017. FINDINGS: Lower chest: Basilar atelectasis/infiltrates  with small pleural effusions. Pacer leads right ventricle. Hepatobiliary: There are 2 drainage catheters surrounding the liver. Drainage catheter extending into collection along the anterior superior margin of the left lobe liver with decrease in size and mass effect of abscess now measuring 2 x 5.2 x 9 cm versus prior 3.2 x 6 x 9.8 cm. Catheter extending into the abscess along the inferior posterior aspect of the right lobe liver contains small amount a gas  (possibly from flushing) and has overall similar dimensions to prior exam measuring 7.8 x 1.8 x 8 cm. Hyperdense material within the gallbladder. Pancreas: Taking into account limitation by non contrast imaging, no primary pancreatic abnormality. Spleen: Pigtail catheter placed for drainage of perisplenic/ subdiaphragmatic abscess. Significant decrease in size of perisplenic abscess now with maximal thickness of 1.1 cm versus prior 4.1 cm. Adrenals/Urinary Tract: No hydronephrosis. Right renal 2 cm cyst and possibly hyperdense left renal 1 cm cyst. No adrenal lesion. Gas in the urinary bladder may be related to recent manipulation. Stomach/Bowel: Post surgery with staple line at level of proximal ascending colon. Colostomy right lower quadrant. Complex fluid collections within the abdomen and pelvis. The 2 largest fluid collections are in the lower right pericolic gutter (adjacent to suture line of proximal ascending colon) appearing slightly dense and minimally more prominent than on the prior examination now measuring 10 x 4.2 x 4.4 cm and previously measuring 9.8 x 4 x 3.9 cm. Largest collection in the pelvis adjacent to the sigmoid colon as decreased in size now measuring 4.5 x 4.8 cm and previously measured 4.8 x 5 cm. Vascular/Lymphatic: Atherosclerotic changes aorta and iliac/femoral artery is without abdominal aortic aneurysm. Reactive lymph nodes. Reproductive: No acute abnormality. Other: Large anterior abdominal/pelvic incision site healing by secondary intent. Small amount of fluid extends into the inguinal canal. Mild third spacing of fluid. Musculoskeletal: Prominent degenerative changes lumbar spine without change. No surrounding soft tissue abnormality to suggest spread of infection to the thoracic spine. IMPRESSION: Post surgery with staple line at level of proximal ascending colon. Colostomy right lower quadrant. Pigtail catheter placed for drainage of perisplenic/ subdiaphragmatic abscess.  Significant decrease in size of perisplenic abscess now with maximal thickness of 1.1 cm versus prior 4.1 cm. Drainage catheter extending into collection along the anterior superior margin of the left lobe liver with decrease in size and decreased mass effect of abscess which now measures 2 x 5.2 x 9 cm versus prior 3.2 x 6 x 9.8 cm. Catheter extending into the abscess along the inferior posterior aspect of the right lobe liver contains small amount a gas (possibly from flushing) and has overall similar dimensions to prior exam measuring 7.8 x 1.8 x 8 cm. Complex fluid collections within the abdomen and pelvis. The 2 largest fluid collections are in the lower right pericolic gutter (adjacent to suture line of proximal ascending colon) appearing slightly dense and minimally more prominent than on the prior examination now measuring 10 x 4.2 x 4.4 cm and previously measuring 9.8 x 4 x 3.9 cm. Largest collection in the pelvis adjacent to the sigmoid colon has decreased in size now measuring 4.5 x 4.8 cm and previously measured 4.8 x 5 cm. Bibasilar atelectasis/infiltrates with small pleural effusions. Pacemaker leads in the region of the right ventricle. Electronically Signed   By: Lacy DuverneySteven  Olson M.D.   On: 08/15/2017 14:11

## 2017-08-16 NOTE — Progress Notes (Signed)
PHARMACY - ADULT TOTAL PARENTERAL NUTRITION CONSULT NOTE   Pharmacy Consult:  TPN Indication: Prolonged ileus  Patient Measurements: Height: _0  (177.8 cm) Weight: 173 lb 4.5 oz (78.6 kg) IBW/kg (Calculated) : 73 TPN AdjBW (KG): 78.6 Body mass index is 24.86 kg/m.  Assessment:  21 YOM admitted on 07/23/17 with severe and worsening abdominal distention, pain, emesis and lack of bowel movements x3 days.  No significant PMH.  He was found to have perforated small bowel s/p repair on 07/23/17 and washout, ileocecetomy, ileostomy and closure of abdominal wall on 07/25/17.  Pharmacy consulted to manage TPN.  Patient transferred to [facility] on 08/13/17 for transvenous pacer insertion.  GI: Ex-lap with drainage of abd abscess and evacuation of pelvic hematoma 12/12.  Wound is dehisced, repeat CT yesterday. Surgery will keep NPO and consult IR for possible perc drainage if accessible. Albumin 1.4. Prealbumin low at 14.8, drain O/P 171m, ileostomy O/P 601m(up). Last BM 12/27. PPI IV Endo: No hx DM - CBGs well controlled Insulin requirements in the past 24 hours: 6 units SSI Lytes: Na/Cl elevated, K+ 3.6 (goal >/= 4 for ileus), CO2 improved to 24, CoCa 10.5. Others wnl. Renal: CRRT 12/5 >> 12/9.  SCr down 1.98 (was 0.99), BUN 50 - UOP 1.1 ml/kg/hr, net +6.8L since admit, bicarb gtt at 75 ml/hr  Pulm: extubated 12/14, re-intubated 12/20 during code and extubated again, currently on RA Cards: s/p code blue 12/20, cath 12/25 - BP elevated, HR WNL (TV pacer) - IV hydralazine Hepatobil: liver necrosis - AST/ALT 375/148, alk phos elevated at 142, tbili down to 1.2, TG elevated but down to 227.  Neuro: A&O ID: Zosyn for E.coli abscess and bacteremia - afebrile, WBC up 18.3 TPN Access: PICC triple lumen placed 08/04/17 TPN start date: 07/26/17  Nutritional Goals (per RD rec on 12/27): Re-estimated needs: 2200-2400 kCal and 118-141gm protein per day   Current Nutrition:  TPN  Plan:  Continue TPN at  [id number] This TPN provides 127 g of protein, 410 g of dextrose, and 37 g of lipids for a total of 2,272 kcals meeting 100% of patient needs Electrolytes in TPN: No Na, low Phos, Mg, Ca, K providing 12m29may. Max acetate Add MVI, trace elements in TPN Continue moderate Q6h SSI and adjust as needed Bicarb gtt stopped Monitor TPN labs F/U need for D5W, may help to be off bicarb gtt  NathElenor QuinonesarmD, BCPSLifebright Community Hospital Of Earlynical Pharmacist Pager [phone]/2018 7:58 AM

## 2017-08-16 NOTE — Care Management Note (Signed)
Case Management Note Previous CM note completed by Leone Havenaylor, Deborah Clinton, RN--08/14/2017, 6:28 PM  Patient Details  Name: Lamar LaundryFrederick Rengel MRN: [id number] Date of Birth: 1955-12-16  Subjective/Objective:  Peritonitis secondary to small bowel perforation, s/p resection of termineal ileum required relook for drainage of abd abscess and evacuation of pelvic hematoma, s/p left heart cath with temp pacer placed yesterday, ep consulted, conts on iv abx, tpn, hydralazine iv, iv pain meds, ivk runs x 6, sodium bicarb drip, palliative consulted for goc. Pt rec SNF.                   Action/Plan: NCM will follow along with CSW for dc needs.   Expected Discharge Date:  (unknown)               Expected Discharge Plan:  Skilled Nursing Facility  In-House Referral:  Clinical Social Work  Discharge planning Services  CM Consult  Post Acute Care Choice:    Choice offered to:     DME Arranged:    DME Agency:     HH Arranged:    HH Agency:     Status of Service:  In process, will continue to follow  If discussed at Long Length of Stay Meetings, dates discussed:    Discharge Disposition:   Additional Comments:  08/16/17- 1300- Donn PieriniKristi Sarahjane Matherly RN, CM- [facility] spoke with April- regarding update on pt status- per April [facility]- PCP- is Dr. Christella ScheuermannWymer- [facility] is Delila PereyraBertina Duncan- contact [phone] ext. [phone] for transition needs- pt is non [facility] with the [facility]- would have ST-rehab options -however would not qualify for LT skilled needs with the [facility]- CM and CSW to follow for transition needs - CSW to follow for placement needs.   Darrold SpanWebster, Kamiya Acord Hall, RN 08/16/2017, 1:07 PM 2H coverage- 08/16/17

## 2017-08-16 NOTE — Progress Notes (Signed)
Referring Physician(s): Dr. Marin Olphristopher White  Supervising Physician: Gilmer MorWagner, Jaime  [facility]  Chief Complaint: RLQ fluid collection  Subjective: Patient is known to IR service for prior drain placed on 08/08/17 in the LUQ.  Repeat imaging now shows another collection in the RLQ.  A request has been made for a new perc drain to be placed.  The patient just got up with PT from the bed to the chair.  He has no current complaints.   Allergies: Patient has no known allergies.  Medications: Prior to Admission medications   Medication Sig Start Date End Date Taking? Authorizing Provider  clindamycin (CLEOCIN) 300 MG capsule Take 1 capsule (300 mg total) by mouth 3 (three) times daily. Patient not taking: Reported on 07/23/2017 04/21/16   Dominica SeverinGramig, William, MD  oxyCODONE (OXY IR/ROXICODONE) 5 MG immediate release tablet Take 1-2 tablets (5-10 mg total) by mouth every 3 (three) hours as needed for moderate pain. Patient not taking: Reported on 07/23/2017 04/21/16   Dominica SeverinGramig, William, MD    Vital Signs: BP 137/76   Pulse (!) 101   Temp 97.9 F (36.6 C) (Oral)   Resp (!) 24   Ht 5\' 10"  (1.778 m)   Wt 173 lb 4.5 oz (78.6 kg)   SpO2 100%   BMI 24.86 kg/m   Physical Exam: Abd: soft, multiple drains in place.  2 right-sided surgical drains.  LUQ IR drain with purulent appearing output. 65cc yesterday.  Ileostomy in place and midline wound is covered up.  Imaging: Ct Abdomen Pelvis Wo Contrast  Result Date: 08/15/2017 CLINICAL DATA:  61 year old male with perforated small bowel and ischemic cecum post resection. Follow-up for evaluation of abdominal abscess/hematoma. Subsequent encounter. EXAM: CT ABDOMEN AND PELVIS WITHOUT CONTRAST TECHNIQUE: Multidetector CT imaging of the abdomen and pelvis was performed following the standard protocol without IV contrast. COMPARISON:  Several prior CTs, most recent 08/08/2017 and 08/07/2017. FINDINGS: Lower chest: Basilar  atelectasis/infiltrates with small pleural effusions. Pacer leads right ventricle. Hepatobiliary: There are 2 drainage catheters surrounding the liver. Drainage catheter extending into collection along the anterior superior margin of the left lobe liver with decrease in size and mass effect of abscess now measuring 2 x 5.2 x 9 cm versus prior 3.2 x 6 x 9.8 cm. Catheter extending into the abscess along the inferior posterior aspect of the right lobe liver contains small amount a gas (possibly from flushing) and has overall similar dimensions to prior exam measuring 7.8 x 1.8 x 8 cm. Hyperdense material within the gallbladder. Pancreas: Taking into account limitation by non contrast imaging, no primary pancreatic abnormality. Spleen: Pigtail catheter placed for drainage of perisplenic/ subdiaphragmatic abscess. Significant decrease in size of perisplenic abscess now with maximal thickness of 1.1 cm versus prior 4.1 cm. Adrenals/Urinary Tract: No hydronephrosis. Right renal 2 cm cyst and possibly hyperdense left renal 1 cm cyst. No adrenal lesion. Gas in the urinary bladder may be related to recent manipulation. Stomach/Bowel: Post surgery with staple line at level of proximal ascending colon. Colostomy right lower quadrant. Complex fluid collections within the abdomen and pelvis. The 2 largest fluid collections are in the lower right pericolic gutter (adjacent to suture line of proximal ascending colon) appearing slightly dense and minimally more prominent than on the prior examination now measuring 10 x 4.2 x 4.4 cm and previously measuring 9.8 x 4 x 3.9 cm. Largest collection in the pelvis adjacent to the sigmoid colon as decreased in size now measuring 4.5 x  4.8 cm and previously measured 4.8 x 5 cm. Vascular/Lymphatic: Atherosclerotic changes aorta and iliac/femoral artery is without abdominal aortic aneurysm. Reactive lymph nodes. Reproductive: No acute abnormality. Other: Large anterior abdominal/pelvic incision  site healing by secondary intent. Small amount of fluid extends into the inguinal canal. Mild third spacing of fluid. Musculoskeletal: Prominent degenerative changes lumbar spine without change. No surrounding soft tissue abnormality to suggest spread of infection to the thoracic spine. IMPRESSION: Post surgery with staple line at level of proximal ascending colon. Colostomy right lower quadrant. Pigtail catheter placed for drainage of perisplenic/ subdiaphragmatic abscess. Significant decrease in size of perisplenic abscess now with maximal thickness of 1.1 cm versus prior 4.1 cm. Drainage catheter extending into collection along the anterior superior margin of the left lobe liver with decrease in size and decreased mass effect of abscess which now measures 2 x 5.2 x 9 cm versus prior 3.2 x 6 x 9.8 cm. Catheter extending into the abscess along the inferior posterior aspect of the right lobe liver contains small amount a gas (possibly from flushing) and has overall similar dimensions to prior exam measuring 7.8 x 1.8 x 8 cm. Complex fluid collections within the abdomen and pelvis. The 2 largest fluid collections are in the lower right pericolic gutter (adjacent to suture line of proximal ascending colon) appearing slightly dense and minimally more prominent than on the prior examination now measuring 10 x 4.2 x 4.4 cm and previously measuring 9.8 x 4 x 3.9 cm. Largest collection in the pelvis adjacent to the sigmoid colon has decreased in size now measuring 4.5 x 4.8 cm and previously measured 4.8 x 5 cm. Bibasilar atelectasis/infiltrates with small pleural effusions. Pacemaker leads in the region of the right ventricle. Electronically Signed   By: Lacy DuverneySteven  Olson M.D.   On: 08/15/2017 14:11    Labs:  CBC: Recent Labs    08/13/17 1430 08/14/17 0418 08/15/17 0625 08/16/17 0340  WBC 13.1* 14.2* 18.3* 17.4*  HGB 7.6* 8.2* 9.0* 8.4*  HCT 23.9* 25.5* 28.2* 26.5*  PLT 480* 523* 587* 568*    COAGS: Recent  Labs    07/23/17 1455 07/24/17 0350 07/31/17 0745 08/08/17 1329  INR 1.78 1.77 1.58 1.45    BMP: Recent Labs    08/13/17 1055 08/13/17 1430 08/14/17 0418 08/15/17 0802 08/16/17 0340  NA 144  --  144 148* 150*  K 3.1*  --  3.2* 3.7 3.6  CL 122*  --  122* 121* 119*  CO2 18*  --  17* 20* 24  GLUCOSE 133*  --  124* 142* 132*  BUN 48*  --  47* 50* 50*  CALCIUM 8.2*  --  8.4* 8.3* 8.4*  CREATININE 2.27* 2.31* 2.21* 2.08* 1.98*  GFRNONAA 29* 28* 30* 32* 34*  GFRAA 34* 33* 35* 37* 40*    LIVER FUNCTION TESTS: Recent Labs    08/10/17 0415 08/12/17 0354 08/12/17 1030 08/15/17 0802  BILITOT 2.0* 0.7 2.3* 1.2  AST 33 50* 49* 375*  ALT 26 27 38 145*  ALKPHOS 81 86 126 142*  PROT 6.6 5.5* 7.1 6.6  ALBUMIN 1.6* 1.5* 1.6* 1.4*    Assessment and Plan: 1. Multiple abdominal fluid collections, s/p perc drain in LUQ.  New RLQ fluid collection  IR will proceed with new drain placement today int he RLQ fluid collection.  He is NPO.  A new INR is pending, but last was normal at 1.45.   Risks and benefits discussed with the patient including bleeding, infection, damage  to adjacent structures, bowel perforation/fistula connection, and sepsis. All of the patient's questions were answered, patient is agreeable to proceed. Consent signed and in chart.  Electronically Signed: Letha Cape 08/16/2017, 10:24 AM   I spent a total of 25 Minutes at the the patient's bedside AND on the patient's hospital floor or unit, greater than 50% of which was counseling/coordinating care for RLQ abdominal fluid collection

## 2017-08-16 NOTE — Progress Notes (Signed)
Subjective/Chief Complaint: Extubated awake alert in good spirits, denies abdominal pain   Objective: Vital signs in last 24 hours: Temp:  [97.5 F (36.4 C)-99.5 F (37.5 C)] 97.9 F (36.6 C) (12/28 0744) Pulse Rate:  [69-121] 101 (12/28 0800) Resp:  [9-41] 24 (12/28 0800) BP: (137-187)/(62-98) 137/76 (12/28 0800) SpO2:  [94 %-100 %] 100 % (12/28 0800) Last BM Date: 08/15/17  Intake/Output from previous day: 12/27 0701 - 12/28 0700 In: 4980.8 [P.O.:500; I.V.:4315.8; IV Piggyback:150] Out: 2719 [Urine:2000; Drains:119; Stool:600] Intake/Output this shift: Total I/O In: 175 [I.V.:175] Out: -   Incision/Wound:wound open and necrotic with chronic dehiscence ostomy viable working  Left JP purulent  Right drains one is purulent the other is serous Abdomen not tender, mildly distended, edematous/anasarca  Lab Results:  Recent Labs    08/15/17 0625 08/16/17 0340  WBC 18.3* 17.4*  HGB 9.0* 8.4*  HCT 28.2* 26.5*  PLT 587* 568*   BMET Recent Labs    08/15/17 0802 08/16/17 0340  NA 148* 150*  K 3.7 3.6  CL 121* 119*  CO2 20* 24  GLUCOSE 142* 132*  BUN 50* 50*  CREATININE 2.08* 1.98*  CALCIUM 8.3* 8.4*   PT/INR No results for input(s): LABPROT, INR in the last 72 hours. ABG No results for input(s): PHART, HCO3 in the last 72 hours.  Invalid input(s): PCO2, PO2  Studies/Results: Ct Abdomen Pelvis Wo Contrast  Result Date: 08/15/2017 CLINICAL DATA:  61 year old male with perforated small bowel and ischemic cecum post resection. Follow-up for evaluation of abdominal abscess/hematoma. Subsequent encounter. EXAM: CT ABDOMEN AND PELVIS WITHOUT CONTRAST TECHNIQUE: Multidetector CT imaging of the abdomen and pelvis was performed following the standard protocol without IV contrast. COMPARISON:  Several prior CTs, most recent 08/08/2017 and 08/07/2017. FINDINGS: Lower chest: Basilar atelectasis/infiltrates with small pleural effusions. Pacer leads right ventricle.  Hepatobiliary: There are 2 drainage catheters surrounding the liver. Drainage catheter extending into collection along the anterior superior margin of the left lobe liver with decrease in size and mass effect of abscess now measuring 2 x 5.2 x 9 cm versus prior 3.2 x 6 x 9.8 cm. Catheter extending into the abscess along the inferior posterior aspect of the right lobe liver contains small amount a gas (possibly from flushing) and has overall similar dimensions to prior exam measuring 7.8 x 1.8 x 8 cm. Hyperdense material within the gallbladder. Pancreas: Taking into account limitation by non contrast imaging, no primary pancreatic abnormality. Spleen: Pigtail catheter placed for drainage of perisplenic/ subdiaphragmatic abscess. Significant decrease in size of perisplenic abscess now with maximal thickness of 1.1 cm versus prior 4.1 cm. Adrenals/Urinary Tract: No hydronephrosis. Right renal 2 cm cyst and possibly hyperdense left renal 1 cm cyst. No adrenal lesion. Gas in the urinary bladder may be related to recent manipulation. Stomach/Bowel: Post surgery with staple line at level of proximal ascending colon. Colostomy right lower quadrant. Complex fluid collections within the abdomen and pelvis. The 2 largest fluid collections are in the lower right pericolic gutter (adjacent to suture line of proximal ascending colon) appearing slightly dense and minimally more prominent than on the prior examination now measuring 10 x 4.2 x 4.4 cm and previously measuring 9.8 x 4 x 3.9 cm. Largest collection in the pelvis adjacent to the sigmoid colon as decreased in size now measuring 4.5 x 4.8 cm and previously measured 4.8 x 5 cm. Vascular/Lymphatic: Atherosclerotic changes aorta and iliac/femoral artery is without abdominal aortic aneurysm. Reactive lymph nodes. Reproductive: No acute abnormality. Other: Large  anterior abdominal/pelvic incision site healing by secondary intent. Small amount of fluid extends into the inguinal  canal. Mild third spacing of fluid. Musculoskeletal: Prominent degenerative changes lumbar spine without change. No surrounding soft tissue abnormality to suggest spread of infection to the thoracic spine. IMPRESSION: Post surgery with staple line at level of proximal ascending colon. Colostomy right lower quadrant. Pigtail catheter placed for drainage of perisplenic/ subdiaphragmatic abscess. Significant decrease in size of perisplenic abscess now with maximal thickness of 1.1 cm versus prior 4.1 cm. Drainage catheter extending into collection along the anterior superior margin of the left lobe liver with decrease in size and decreased mass effect of abscess which now measures 2 x 5.2 x 9 cm versus prior 3.2 x 6 x 9.8 cm. Catheter extending into the abscess along the inferior posterior aspect of the right lobe liver contains small amount a gas (possibly from flushing) and has overall similar dimensions to prior exam measuring 7.8 x 1.8 x 8 cm. Complex fluid collections within the abdomen and pelvis. The 2 largest fluid collections are in the lower right pericolic gutter (adjacent to suture line of proximal ascending colon) appearing slightly dense and minimally more prominent than on the prior examination now measuring 10 x 4.2 x 4.4 cm and previously measuring 9.8 x 4 x 3.9 cm. Largest collection in the pelvis adjacent to the sigmoid colon has decreased in size now measuring 4.5 x 4.8 cm and previously measured 4.8 x 5 cm. Bibasilar atelectasis/infiltrates with small pleural effusions. Pacemaker leads in the region of the right ventricle. Electronically Signed   By: Lacy DuverneySteven  Olson M.D.   On: 08/15/2017 14:11    Anti-infectives: Anti-infectives (From admission, onward)   Start     Dose/Rate Route Frequency Ordered Stop   08/10/17 0500  vancomycin (VANCOCIN) 1,500 mg in sodium chloride 0.9 % 500 mL IVPB  Status:  Discontinued     1,500 mg 250 mL/hr over 120 Minutes Intravenous Every 48 hours 08/08/17 0728  08/12/17 1018   08/08/17 2200  anidulafungin (ERAXIS) 100 mg in sodium chloride 0.9 % 100 mL IVPB  Status:  Discontinued     100 mg 78 mL/hr over 100 Minutes Intravenous Every 24 hours 08/08/17 0524 08/12/17 1018   08/08/17 0445  vancomycin (VANCOCIN) 1,500 mg in sodium chloride 0.9 % 500 mL IVPB     1,500 mg 250 mL/hr over 120 Minutes Intravenous  Once 08/08/17 0431 08/08/17 0659   08/08/17 0430  vancomycin (VANCOCIN) 1,250 mg in sodium chloride 0.9 % 250 mL IVPB  Status:  Discontinued     1,250 mg 166.7 mL/hr over 90 Minutes Intravenous  Once 08/08/17 0418 08/08/17 0428   08/08/17 0415  anidulafungin (ERAXIS) 200 mg in sodium chloride 0.9 % 200 mL IVPB     200 mg 78 mL/hr over 200 Minutes Intravenous  Once 08/08/17 0413 08/08/17 0816   08/05/17 1000  piperacillin-tazobactam (ZOSYN) IVPB 3.375 g     3.375 g 12.5 mL/hr over 240 Minutes Intravenous Every 8 hours 08/05/17 0946     08/02/17 1400  meropenem (MERREM) 1 g in sodium chloride 0.9 % 100 mL IVPB  Status:  Discontinued     1 g 200 mL/hr over 30 Minutes Intravenous Every 12 hours 08/02/17 1111 08/05/17 0945   07/29/17 0600  piperacillin-tazobactam (ZOSYN) IVPB 3.375 g  Status:  Discontinued     3.375 g 12.5 mL/hr over 240 Minutes Intravenous Every 8 hours 07/29/17 0518 08/02/17 1036   07/25/17 2000  vancomycin (VANCOCIN)  IVPB 1000 mg/200 mL premix  Status:  Discontinued     1,000 mg 200 mL/hr over 60 Minutes Intravenous Every 48 hours 07/23/17 2042 07/24/17 0956   07/24/17 2200  piperacillin-tazobactam (ZOSYN) 3.375 g in dextrose 5 % 50 mL IVPB  Status:  Discontinued     3.375 g 100 mL/hr over 30 Minutes Intravenous Every 6 hours 07/24/17 2006 07/29/17 0518   07/24/17 2100  anidulafungin (ERAXIS) 100 mg in sodium chloride 0.9 % 100 mL IVPB  Status:  Discontinued     100 mg 78 mL/hr over 100 Minutes Intravenous Every 24 hours 07/23/17 2039 08/05/17 0945   07/24/17 0000  anidulafungin (ERAXIS) 100 mg in sodium chloride 0.9 % 100 mL  IVPB  Status:  Discontinued     100 mg 78 mL/hr over 100 Minutes Intravenous Every 24 hours 07/23/17 2038 07/23/17 2039   07/23/17 2200  piperacillin-tazobactam (ZOSYN) IVPB 3.375 g  Status:  Discontinued     3.375 g 12.5 mL/hr over 240 Minutes Intravenous Every 8 hours 07/23/17 2043 07/24/17 2006   07/23/17 2100  anidulafungin (ERAXIS) 200 mg in sodium chloride 0.9 % 200 mL IVPB     200 mg 78 mL/hr over 200 Minutes Intravenous  Once 07/23/17 2038 07/24/17 0239   07/23/17 2100  vancomycin (VANCOCIN) 1,500 mg in sodium chloride 0.9 % 500 mL IVPB     1,500 mg 250 mL/hr over 120 Minutes Intravenous  Once 07/23/17 2041 07/23/17 2317   07/23/17 1515  piperacillin-tazobactam (ZOSYN) IVPB 3.375 g     3.375 g 100 mL/hr over 30 Minutes Intravenous  Once 07/23/17 1511 07/23/17 1611      Assessment/Plan: s/p Procedure(s): Temporary Pacemaker Insertion LEFT HEART CATH AND CORONARY ANGIOGRAPHY (N/A) Perforated distal small boweland ischemia of cecum 1.S/Pexploratory laparotomy, small bowel resection without anastomosis, placement of open wound VAC system 07/23/17 Dr. Gaynelle Adu 2. S/p Reexploration of abdomen, ileocecectomy, creation of end ileostomy,closure of abdominal wall, 07/25/17, Dr. Almond Lint 3. Exploratory laparotomy, drainage of abdominal abscess and evacuation of pelvic hematoma, RUQ drain, and left lateral drain placement, 07/31/17, Dr. Glenna Fellows (Findings: Subdiaphragmatic and subhepatic abscesses.Large organizing pelvic hematoma. Apparent necrotic tissue left lobe of the liver.) Sepsis/shock -resolved, wbc decreasing Bacteremia - Enterobacteriaceae + E Coli, on abx per ccm, awaiting additional drain cultures Cardiac arrest - Code Blue 08/08/17 - reintubated/pressors/increase of antibiotics Extubated 12/24, off pressors  Acute respiratory failure -per ccm Acute renal failure-CRRT discontinued 07/27/17 - creatinine continues to  decrease Malnutrition/Deconditioning - Prealbumin <5last time checked, on tpn Anemia - stable  GI-needs continued dressing changes, wound isdehiscedbut not much more to do is certainly at risk, continue drains; IR consult placed for consideration of percutaneous drainage of additional undrained collections if accessible and cultures; if not accessible, plan will be to continue IV abx Keep NPO BJ:[id number] 07/23/17- 07/24/17,restarted 12/20 day 1,Zosyn 07/23/17- 12/14,anidulafungin 07/23/17 - 12/17,restarted 12/20 day 1,Meropenem 12/14 - 12/17 =>>Zosyn restarted 12/17 =>>anidulafunginrestarted 12/20 =>> Vancomycin restarted 12/20 =>>  YQM:VHQI - Heparin Follow-up:Dr. Gaynelle Adu   LOS: 24 days   Stephanie Coup [facility] 08/16/2017

## 2017-08-16 NOTE — Progress Notes (Signed)
Pharmacy Antibiotic Note  Lamar LaundryFrederick Vellucci is a 61 y.o. male admitted on 07/23/2017 with sepsis, intra-abdominal infection.  Pharmacy has been consulted for zosyn dosing.  [facility] for placement of temp pacing wire due to episodes of bradycardia. Currently on day 25 of antibiotics- currently only on zosyn 3.375gm IV q8h EI. Last abscess culture continuing to grow pan-sensitive E Coli however given gravity of illness this is not believed to be only pathogen per surgery team or CCM.   WBC 18 (stable in the teens) continues low grade fever 99-100. Renal function remains stable (SCr 2).  Per repeat CT 12/27 abscess draining and slowly decreasing in size.  Plan:  Continue Zosyn 3.375g IV Q8H EI  Follow up renal fxn, culture results, and clinical course.   Height: 5\' 10"  (177.8 cm) Weight: 173 lb 4.5 oz (78.6 kg) IBW/kg (Calculated) : 73  Temp (24hrs), Avg:98.8 F (37.1 C), Min:97.5 F (36.4 C), Max:99.7 F (37.6 C)  Recent Labs  Lab 08/13/17 1055 08/13/17 1430 08/14/17 0418 08/15/17 0625 08/15/17 0802 08/16/17 0340  WBC 13.7* 13.1* 14.2* 18.3*  --  17.4*  CREATININE 2.27* 2.31* 2.21*  --  2.08* 1.98*    Estimated Creatinine Clearance: 39.4 mL/min (A) (by C-G formula based on SCr of 1.98 mg/dL (H)).    No Known Allergies  Antimicrobials this admission: 12/4 Vancomycin>>12/5 12/20 >>12/24 12/4 Zosyn>>12/14 12/17 >> 12/14 meropenem >> 12/17 12/4 Eraxis >> 12/17 12/20 >>12/24  Microbiology results: 12/4 MRSA PCR: neg 12/4BCx: Ecoli  (pan-sensitive)       12/5  BCID = E.coli, no resistance 12/10 BCx: NGF 12/11 UCx: NGF 12/12 abscess cx: E coli (pan-sensitive) 12/20 Abscess: E Coli (pan-sensitive)   Leota SauersLisa Aeneas Longsworth Pharm.D. CPP, BCPS Clinical Pharmacist [phone] 08/16/2017 8:01 AM

## 2017-08-16 NOTE — Progress Notes (Signed)
Physical Therapy Treatment Patient Details Name: Caleb LaundryFrederick Behe MRN: [id number] DOB: June 15, 1956 Today's Date: 08/16/2017    History of Present Illness Pt is a 61 yo male admitted 07/23/17 with intestinal perforation. Now s/p resection & ileostomy on 12/5 and 12/6; s/p abscess draining 12/12. VDRF. A fib with RVR. ETT 12/20 >> 12/24. 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 PMH includes ETOH abuse, PE.     PT Comments    Pt performed bed mobility to edge of bed, posterior lean present sitting edge of bed until patient able to place B LEs on the floor for added support with balance.  Pt required cues for upper trunk control. C/o mild dizziness initially upon sitting but while resting edge of bed symptoms resolved.  Pt remains severely deconditioned and will continue to benefit from skilled placement short term at SNF to improve strength and function. Pt able to stand and pivot to chair with mod to max assistance and +2 assist for line and lead management.    BP pre session 153/80  BP post session 151/94    Follow Up Recommendations  SNF     Equipment Recommendations  Other (comment)(TBD at next venue)    Recommendations for Other Services OT consult     Precautions / Restrictions Precautions Precautions: Fall Precaution Comments: Multiple lines Restrictions Weight Bearing Restrictions: No    Mobility  Bed Mobility Overal bed mobility: Needs Assistance Bed Mobility: Supine to Sit     Supine to sit: Mod assist     General bed mobility comments: Cues for hand placement, assist to advance B LEs, and assist for trunk elevation.  Pt reports mild dizziness edge of bed but after sitting for a few minutes symptoms subsided.    Transfers Overall transfer level: Needs assistance Equipment used: 4-wheeled walker(EVA walker) Transfers: Sit to/from Stand Sit to Stand: Mod assist;+2 safety/equipment(for equipment.  ) Stand pivot  transfers: Max assist;+2 safety/equipment       General transfer comment: Cues for hand placement, Pt pulling on EVA platforms despite cues to push from seated surface.    Ambulation/Gait Ambulation/Gait assistance: Mod assist;+2 safety/equipment Ambulation Distance (Feet): 3 Feet Assistive device: 4-wheeled walker(EVA walker) Gait Pattern/deviations: Shuffle;Trunk flexed;Step-through pattern Gait velocity: Decreased Gait velocity interpretation: Below normal speed for age/gender General Gait Details: Cues for sequencing and EVA walker placement.  Pt with flexed LEs due to decreased quad strength and heavily reliant on platforms on EVA walker for support.  Cues for upper trunk/head control.     Stairs            Wheelchair Mobility    Modified Rankin (Stroke Patients Only)       Balance Overall balance assessment: Needs assistance   Sitting balance-Leahy Scale: Fair Sitting balance - Comments: Min guard for sitting balance     Standing balance-Leahy Scale: Poor                              Cognition Arousal/Alertness: Awake/alert Behavior During Therapy: WFL for tasks assessed/performed Overall Cognitive Status: Within Functional Limits for tasks assessed                                        Exercises      General Comments        Pertinent Vitals/Pain Pain Assessment: 0-10  Pain Score: 6  Pain Location: abdomen with movement Pain Descriptors / Indicators: Sore Pain Intervention(s): Monitored during session;Repositioned    Home Living                      Prior Function            PT Goals (current goals can now be found in the care plan section) Acute Rehab PT Goals Patient Stated Goal: to regain PLOF Potential to Achieve Goals: Good Progress towards PT goals: Progressing toward goals    Frequency    Min 2X/week      PT Plan Frequency needs to be updated    Co-evaluation               AM-PAC PT "6 Clicks" Daily Activity  Outcome Measure  Difficulty turning over in bed (including adjusting bedclothes, sheets and blankets)?: Unable Difficulty moving from lying on back to sitting on the side of the bed? : Unable Difficulty sitting down on and standing up from a chair with arms (e.g., wheelchair, bedside commode, etc,.)?: Unable Help needed moving to and from a bed to chair (including a wheelchair)?: A Lot Help needed walking in hospital room?: A Lot Help needed climbing 3-5 steps with a railing? : Total 6 Click Score: 8    End of Session Equipment Utilized During Treatment: Gait belt Activity Tolerance: Patient tolerated treatment well;Patient limited by fatigue Patient left: in chair;with call bell/phone within reach Nurse Communication: Mobility status PT Visit Diagnosis: Muscle weakness (generalized) (M62.81);Other abnormalities of gait and mobility (R26.89)     Time: [id number] PT Time Calculation (min) (ACUTE ONLY): 29 min  Charges:  $Therapeutic Activity: 23-37 mins                    G Codes:       Joycelyn RuaAimee Anzal Bartnick, PTA pager [phone]    Florestine Aversimee J Dianelly Ferran 08/16/2017, 10:17 AM

## 2017-08-16 NOTE — Progress Notes (Signed)
Progress Note  Patient Name: Caleb Singh Date of Encounter: 08/16/2017  Primary Cardiologist: Hochrein   Subjective   61 yo admitted with perforated bowel Has had episodes of bradycardia Had a temp pacer placed on Dec. 25. Still requiring intermittant pacing .    Inpatient Medications    Scheduled Meds: . alteplase  2 mg Intracatheter Once  . chlorhexidine gluconate (MEDLINE KIT)  15 mL Mouth Rinse BID  . Chlorhexidine Gluconate Cloth  6 each Topical Daily  . [START ON 08/17/2017] heparin  5,000 Units Subcutaneous Q8H  . hydrALAZINE  20 mg Intravenous Q4H  . insulin aspart  0-15 Units Subcutaneous Q6H  . mouth rinse  15 mL Mouth Rinse BID  . pantoprazole (PROTONIX) IV  40 mg Intravenous Q24H  . sodium chloride flush  10-40 mL Intracatheter Q12H  . sodium chloride flush  3 mL Intravenous Q12H  . sodium chloride flush  5 mL Intravenous Q8H   Continuous Infusions: . sodium chloride 250 mL (08/16/17 0800)  . piperacillin-tazobactam (ZOSYN)  IV Stopped (08/16/17 0505)  . TPN ADULT (ION) 90 mL/hr at 08/16/17 0800   PRN Meds: sodium chloride, acetaminophen, acetaminophen, iopamidol, morphine injection, nitroGLYCERIN, ondansetron (ZOFRAN) IV, sodium chloride flush, sodium chloride flush   Vital Signs    Vitals:   08/16/17 0600 08/16/17 0700 08/16/17 0744 08/16/17 0800  BP: (!) 144/82 (!) 153/85 (!) 153/85 137/76  Pulse: (!) 112 77 99 (!) 101  Resp: (!) 26 18 (!) 25 (!) 24  Temp:   97.9 F (36.6 C)   TempSrc:   Oral   SpO2: 98% 99% 100% 100%  Weight:      Height:        Intake/Output Summary (Last 24 hours) at 08/16/2017 0942 Last data filed at 08/16/2017 0800 Gross per 24 hour  Intake 4645.75 ml  Output 2554 ml  Net 2091.75 ml   Filed Weights   08/13/17 0500 08/14/17 0600 08/15/17 0500  Weight: 176 lb 5.9 oz (80 kg) 167 lb 12.3 oz (76.1 kg) 173 lb 4.5 oz (78.6 kg)    Telemetry    NSR with episodes of pauses with subsequent V pacing - Personally  Reviewed  ECG     - Personally Reviewed  Physical Exam   GEN: chronically ill appearing man   Neck: No JVD Cardiac: RRR, no murmurs, rubs, or gallops.   Temp pacer site  looks ok  Respiratory: Clear to auscultation bilaterally. GI: soft  MS: No edema; No deformity. Neuro:  Nonfocal  Psych: Normal affect   Labs    Chemistry Recent Labs  Lab 08/12/17 0354 08/12/17 1030  08/14/17 0418 08/15/17 0802 08/16/17 0340  NA 125* 142   < > 144 148* 150*  K 3.9 3.7   < > 3.2* 3.7 3.6  CL 105 121*   < > 122* 121* 119*  CO2 19* 21*   < > 17* 20* 24  GLUCOSE 1,107* 126*   < > 124* 142* 132*  BUN 49* 52*   < > 47* 50* 50*  CREATININE 2.26* 2.41*   < > 2.21* 2.08* 1.98*  CALCIUM 7.1* 8.4*   < > 8.4* 8.3* 8.4*  PROT 5.5* 7.1  --   --  6.6  --   ALBUMIN 1.5* 1.6*  --   --  1.4*  --   AST 50* 49*  --   --  375*  --   ALT 27 38  --   --  145*  --  ALKPHOS 86 126  --   --  142*  --   BILITOT 0.7 2.3*  --   --  1.2  --   GFRNONAA 29* 27*   < > 30* 32* 34*  GFRAA 34* 31*   < > 35* 37* 40*  ANIONGAP 1* 0*   < > '5 7 7   '$ < > = values in this interval not displayed.     Hematology Recent Labs  Lab 08/14/17 0418 08/15/17 0625 08/16/17 0340  WBC 14.2* 18.3* 17.4*  RBC 2.78* 3.13* 2.92*  HGB 8.2* 9.0* 8.4*  HCT 25.5* 28.2* 26.5*  MCV 91.7 90.1 90.8  MCH 29.5 28.8 28.8  MCHC 32.2 31.9 31.7  RDW 15.6* 15.5 16.0*  PLT 523* 587* 568*    Cardiac Enzymes Recent Labs  Lab 08/13/17 0447 08/13/17 1055  TROPONINI 0.10* 0.10*   No results for input(s): TROPIPOC in the last 168 hours.   BNPNo results for input(s): BNP, PROBNP in the last 168 hours.   DDimer No results for input(s): DDIMER in the last 168 hours.   Radiology    Ct Abdomen Pelvis Wo Contrast  Result Date: 08/15/2017 CLINICAL DATA:  61 year old male with perforated small bowel and ischemic cecum post resection. Follow-up for evaluation of abdominal abscess/hematoma. Subsequent encounter. EXAM: CT ABDOMEN AND PELVIS  WITHOUT CONTRAST TECHNIQUE: Multidetector CT imaging of the abdomen and pelvis was performed following the standard protocol without IV contrast. COMPARISON:  Several prior CTs, most recent 08/08/2017 and 08/07/2017. FINDINGS: Lower chest: Basilar atelectasis/infiltrates with small pleural effusions. Pacer leads right ventricle. Hepatobiliary: There are 2 drainage catheters surrounding the liver. Drainage catheter extending into collection along the anterior superior margin of the left lobe liver with decrease in size and mass effect of abscess now measuring 2 x 5.2 x 9 cm versus prior 3.2 x 6 x 9.8 cm. Catheter extending into the abscess along the inferior posterior aspect of the right lobe liver contains small amount a gas (possibly from flushing) and has overall similar dimensions to prior exam measuring 7.8 x 1.8 x 8 cm. Hyperdense material within the gallbladder. Pancreas: Taking into account limitation by non contrast imaging, no primary pancreatic abnormality. Spleen: Pigtail catheter placed for drainage of perisplenic/ subdiaphragmatic abscess. Significant decrease in size of perisplenic abscess now with maximal thickness of 1.1 cm versus prior 4.1 cm. Adrenals/Urinary Tract: No hydronephrosis. Right renal 2 cm cyst and possibly hyperdense left renal 1 cm cyst. No adrenal lesion. Gas in the urinary bladder may be related to recent manipulation. Stomach/Bowel: Post surgery with staple line at level of proximal ascending colon. Colostomy right lower quadrant. Complex fluid collections within the abdomen and pelvis. The 2 largest fluid collections are in the lower right pericolic gutter (adjacent to suture line of proximal ascending colon) appearing slightly dense and minimally more prominent than on the prior examination now measuring 10 x 4.2 x 4.4 cm and previously measuring 9.8 x 4 x 3.9 cm. Largest collection in the pelvis adjacent to the sigmoid colon as decreased in size now measuring 4.5 x 4.8 cm and  previously measured 4.8 x 5 cm. Vascular/Lymphatic: Atherosclerotic changes aorta and iliac/femoral artery is without abdominal aortic aneurysm. Reactive lymph nodes. Reproductive: No acute abnormality. Other: Large anterior abdominal/pelvic incision site healing by secondary intent. Small amount of fluid extends into the inguinal canal. Mild third spacing of fluid. Musculoskeletal: Prominent degenerative changes lumbar spine without change. No surrounding soft tissue abnormality to suggest spread of infection to the  thoracic spine. IMPRESSION: Post surgery with staple line at level of proximal ascending colon. Colostomy right lower quadrant. Pigtail catheter placed for drainage of perisplenic/ subdiaphragmatic abscess. Significant decrease in size of perisplenic abscess now with maximal thickness of 1.1 cm versus prior 4.1 cm. Drainage catheter extending into collection along the anterior superior margin of the left lobe liver with decrease in size and decreased mass effect of abscess which now measures 2 x 5.2 x 9 cm versus prior 3.2 x 6 x 9.8 cm. Catheter extending into the abscess along the inferior posterior aspect of the right lobe liver contains small amount a gas (possibly from flushing) and has overall similar dimensions to prior exam measuring 7.8 x 1.8 x 8 cm. Complex fluid collections within the abdomen and pelvis. The 2 largest fluid collections are in the lower right pericolic gutter (adjacent to suture line of proximal ascending colon) appearing slightly dense and minimally more prominent than on the prior examination now measuring 10 x 4.2 x 4.4 cm and previously measuring 9.8 x 4 x 3.9 cm. Largest collection in the pelvis adjacent to the sigmoid colon has decreased in size now measuring 4.5 x 4.8 cm and previously measured 4.8 x 5 cm. Bibasilar atelectasis/infiltrates with small pleural effusions. Pacemaker leads in the region of the right ventricle. Electronically Signed   By: Genia Del M.D.    On: 08/15/2017 14:11    Cardiac Studies      Patient Profile     62 y.o. male with perforated bowel.  Has had episodes of sinus pauses requiring temp pacing   Assessment & Plan    1.   Sinus pauses :  Still requiring temp pacing  Will discuss with EP.   He may need placement of temp / permanent pacer ( softer lead, would be better for long term temp pacing. Agree that placement of a perm. Pacer at this point is at high risk of infection.   For questions or updates, please contact [place] Please consult [email or web address] for contact info under Cardiology/STEMI.      Signed, Mertie Moores, MD  08/16/2017, 9:42 AM

## 2017-08-16 NOTE — Progress Notes (Signed)
   08/16/17 1300  Clinical Encounter Type  Visited With Patient  Visit Type Follow-up  Referral From Nurse  Consult/Referral To Chaplain  Stress Factors  Patient Stress Factors Major life changes  Chaplain visited with the PT to complete an advanced directive. In sitting down and speaking with the PT it became clear that he is seeking a Chief Technology Officerower of Attorney for purposes that are related to needs outside of healthcare.  Chaplain informed the PT of such and PT will ask sister to get a POA signed for him so that she can handle his affairs.

## 2017-08-17 ENCOUNTER — Inpatient Hospital Stay (HOSPITAL_COMMUNITY): Payer: Non-veteran care

## 2017-08-17 DIAGNOSIS — Z87898 Personal history of other specified conditions: Secondary | ICD-10-CM

## 2017-08-17 LAB — GLUCOSE, CAPILLARY
GLUCOSE-CAPILLARY: 134 mg/dL — AB (ref 65–99)
Glucose-Capillary: 119 mg/dL — ABNORMAL HIGH (ref 65–99)
Glucose-Capillary: 113 mg/dL — ABNORMAL HIGH (ref 65–99)
Glucose-Capillary: 109 mg/dL — ABNORMAL HIGH (ref 65–99)
Glucose-Capillary: 117 mg/dL — ABNORMAL HIGH (ref 65–99)

## 2017-08-17 MED ORDER — CHLORHEXIDINE GLUCONATE 0.12 % MT SOLN
15.0000 mL | Freq: Two times a day (BID) | OROMUCOSAL | Status: DC
  Administered 2017-08-17 – 2017-08-31 (×26): 15 mL via OROMUCOSAL
  Filled 2017-08-17 (×22): qty 15

## 2017-08-17 MED ORDER — ORAL CARE MOUTH RINSE
15.0000 mL | Freq: Two times a day (BID) | OROMUCOSAL | Status: DC
  Administered 2017-08-18 – 2017-08-29 (×17): 15 mL via OROMUCOSAL

## 2017-08-17 MED ORDER — DEXTROSE 5 % IV SOLN
INTRAVENOUS | Status: DC
  Administered 2017-08-17 – 2017-08-23 (×8): via INTRAVENOUS

## 2017-08-17 MED ORDER — MIDAZOLAM HCL 2 MG/2ML IJ SOLN
INTRAMUSCULAR | Status: AC
  Filled 2017-08-17: qty 6

## 2017-08-17 MED ORDER — ACETAMINOPHEN 325 MG PO TABS: 650.0000 mg | ORAL_TABLET | Freq: Four times a day (QID) | ORAL | Status: DC | PRN

## 2017-08-17 MED ORDER — FENTANYL CITRATE (PF) 100 MCG/2ML IJ SOLN
INTRAMUSCULAR | Status: AC | PRN
  Administered 2017-08-17: 50 ug via INTRAVENOUS

## 2017-08-17 MED ORDER — LIDOCAINE HCL 1 % IJ SOLN
INTRAMUSCULAR | Status: AC
  Filled 2017-08-17: qty 20

## 2017-08-17 MED ORDER — TRAVASOL 10 % IV SOLN
INTRAVENOUS | Status: AC
  Administered 2017-08-17: 18:00:00 via INTRAVENOUS
  Filled 2017-08-17: qty 1274.4

## 2017-08-17 MED ORDER — FENTANYL CITRATE (PF) 100 MCG/2ML IJ SOLN
INTRAMUSCULAR | Status: AC
  Filled 2017-08-17: qty 4

## 2017-08-17 MED ORDER — MIDAZOLAM HCL 2 MG/2ML IJ SOLN
INTRAMUSCULAR | Status: AC | PRN
  Administered 2017-08-17: 1 mg via INTRAVENOUS

## 2017-08-17 NOTE — Progress Notes (Signed)
Subjective/Chief Complaint: Awake, alert in good spirits, denies abdominal pain. Pending IR for additional perc drain placement  Objective: Vital signs in last 24 hours: Temp:  [98 F (36.7 C)-98.3 F (36.8 C)] 98.2 F (36.8 C) (12/29 0700) Pulse Rate:  [69-121] 89 (12/29 0900) Resp:  [24-34] 32 (12/29 0900) BP: (136-180)/(71-91) 156/75 (12/29 0900) SpO2:  [96 %-100 %] 96 % (12/29 0900) Weight:  [79.1 kg (174 lb 6.1 oz)] 79.1 kg (174 lb 6.1 oz) (12/29 0400) Last BM Date: 08/17/17(ostomy)  Intake/Output from previous day: 12/28 0701 - 12/29 0700 In: 2608.5 [I.V.:2413.5; IV Piggyback:150] Out: 3310 [Urine:3050; Drains:60; Stool:200] Intake/Output this shift: Total I/O In: 100 [I.V.:100] Out: 100 [Urine:100]  Incision/Wound:wound open and necrotic with chronic dehiscence ostomy viable working  Left JP purulent  Right drains one is purulent the other is serous Abdomen not tender, mildly distended, edematous/anasarca  Lab Results:  Recent Labs    08/15/17 0625 08/16/17 0340  WBC 18.3* 17.4*  HGB 9.0* 8.4*  HCT 28.2* 26.5*  PLT 587* 568*   BMET Recent Labs    08/15/17 0802 08/16/17 0340  NA 148* 150*  K 3.7 3.6  CL 121* 119*  CO2 20* 24  GLUCOSE 142* 132*  BUN 50* 50*  CREATININE 2.08* 1.98*  CALCIUM 8.3* 8.4*   PT/INR No results for input(s): LABPROT, INR in the last 72 hours. ABG No results for input(s): PHART, HCO3 in the last 72 hours.  Invalid input(s): PCO2, PO2  Studies/Results: Ct Abdomen Pelvis Wo Contrast  Result Date: 08/15/2017 CLINICAL DATA:  61 year old male with perforated small bowel and ischemic cecum post resection. Follow-up for evaluation of abdominal abscess/hematoma. Subsequent encounter. EXAM: CT ABDOMEN AND PELVIS WITHOUT CONTRAST TECHNIQUE: Multidetector CT imaging of the abdomen and pelvis was performed following the standard protocol without IV contrast. COMPARISON:  Several prior CTs, most recent 08/08/2017 and 08/07/2017.  FINDINGS: Lower chest: Basilar atelectasis/infiltrates with small pleural effusions. Pacer leads right ventricle. Hepatobiliary: There are 2 drainage catheters surrounding the liver. Drainage catheter extending into collection along the anterior superior margin of the left lobe liver with decrease in size and mass effect of abscess now measuring 2 x 5.2 x 9 cm versus prior 3.2 x 6 x 9.8 cm. Catheter extending into the abscess along the inferior posterior aspect of the right lobe liver contains small amount a gas (possibly from flushing) and has overall similar dimensions to prior exam measuring 7.8 x 1.8 x 8 cm. Hyperdense material within the gallbladder. Pancreas: Taking into account limitation by non contrast imaging, no primary pancreatic abnormality. Spleen: Pigtail catheter placed for drainage of perisplenic/ subdiaphragmatic abscess. Significant decrease in size of perisplenic abscess now with maximal thickness of 1.1 cm versus prior 4.1 cm. Adrenals/Urinary Tract: No hydronephrosis. Right renal 2 cm cyst and possibly hyperdense left renal 1 cm cyst. No adrenal lesion. Gas in the urinary bladder may be related to recent manipulation. Stomach/Bowel: Post surgery with staple line at level of proximal ascending colon. Colostomy right lower quadrant. Complex fluid collections within the abdomen and pelvis. The 2 largest fluid collections are in the lower right pericolic gutter (adjacent to suture line of proximal ascending colon) appearing slightly dense and minimally more prominent than on the prior examination now measuring 10 x 4.2 x 4.4 cm and previously measuring 9.8 x 4 x 3.9 cm. Largest collection in the pelvis adjacent to the sigmoid colon as decreased in size now measuring 4.5 x 4.8 cm and previously measured 4.8 x 5 cm. Vascular/Lymphatic:  Atherosclerotic changes aorta and iliac/femoral artery is without abdominal aortic aneurysm. Reactive lymph nodes. Reproductive: No acute abnormality. Other: Large  anterior abdominal/pelvic incision site healing by secondary intent. Small amount of fluid extends into the inguinal canal. Mild third spacing of fluid. Musculoskeletal: Prominent degenerative changes lumbar spine without change. No surrounding soft tissue abnormality to suggest spread of infection to the thoracic spine. IMPRESSION: Post surgery with staple line at level of proximal ascending colon. Colostomy right lower quadrant. Pigtail catheter placed for drainage of perisplenic/ subdiaphragmatic abscess. Significant decrease in size of perisplenic abscess now with maximal thickness of 1.1 cm versus prior 4.1 cm. Drainage catheter extending into collection along the anterior superior margin of the left lobe liver with decrease in size and decreased mass effect of abscess which now measures 2 x 5.2 x 9 cm versus prior 3.2 x 6 x 9.8 cm. Catheter extending into the abscess along the inferior posterior aspect of the right lobe liver contains small amount a gas (possibly from flushing) and has overall similar dimensions to prior exam measuring 7.8 x 1.8 x 8 cm. Complex fluid collections within the abdomen and pelvis. The 2 largest fluid collections are in the lower right pericolic gutter (adjacent to suture line of proximal ascending colon) appearing slightly dense and minimally more prominent than on the prior examination now measuring 10 x 4.2 x 4.4 cm and previously measuring 9.8 x 4 x 3.9 cm. Largest collection in the pelvis adjacent to the sigmoid colon has decreased in size now measuring 4.5 x 4.8 cm and previously measured 4.8 x 5 cm. Bibasilar atelectasis/infiltrates with small pleural effusions. Pacemaker leads in the region of the right ventricle. Electronically Signed   By: Lacy DuverneySteven  Olson M.D.   On: 08/15/2017 14:11    Anti-infectives: Anti-infectives (From admission, onward)   Start     Dose/Rate Route Frequency Ordered Stop   08/10/17 0500  vancomycin (VANCOCIN) 1,500 mg in sodium chloride 0.9 % 500  mL IVPB  Status:  Discontinued     1,500 mg 250 mL/hr over 120 Minutes Intravenous Every 48 hours 08/08/17 0728 08/12/17 1018   08/08/17 2200  anidulafungin (ERAXIS) 100 mg in sodium chloride 0.9 % 100 mL IVPB  Status:  Discontinued     100 mg 78 mL/hr over 100 Minutes Intravenous Every 24 hours 08/08/17 0524 08/12/17 1018   08/08/17 0445  vancomycin (VANCOCIN) 1,500 mg in sodium chloride 0.9 % 500 mL IVPB     1,500 mg 250 mL/hr over 120 Minutes Intravenous  Once 08/08/17 0431 08/08/17 0659   08/08/17 0430  vancomycin (VANCOCIN) 1,250 mg in sodium chloride 0.9 % 250 mL IVPB  Status:  Discontinued     1,250 mg 166.7 mL/hr over 90 Minutes Intravenous  Once 08/08/17 0418 08/08/17 0428   08/08/17 0415  anidulafungin (ERAXIS) 200 mg in sodium chloride 0.9 % 200 mL IVPB     200 mg 78 mL/hr over 200 Minutes Intravenous  Once 08/08/17 0413 08/08/17 0816   08/05/17 1000  piperacillin-tazobactam (ZOSYN) IVPB 3.375 g     3.375 g 12.5 mL/hr over 240 Minutes Intravenous Every 8 hours 08/05/17 0946     08/02/17 1400  meropenem (MERREM) 1 g in sodium chloride 0.9 % 100 mL IVPB  Status:  Discontinued     1 g 200 mL/hr over 30 Minutes Intravenous Every 12 hours 08/02/17 1111 08/05/17 0945   07/29/17 0600  piperacillin-tazobactam (ZOSYN) IVPB 3.375 g  Status:  Discontinued     3.375 g  12.5 mL/hr over 240 Minutes Intravenous Every 8 hours 07/29/17 0518 08/02/17 1036   07/25/17 2000  vancomycin (VANCOCIN) IVPB 1000 mg/200 mL premix  Status:  Discontinued     1,000 mg 200 mL/hr over 60 Minutes Intravenous Every 48 hours 07/23/17 2042 07/24/17 0956   07/24/17 2200  piperacillin-tazobactam (ZOSYN) 3.375 g in dextrose 5 % 50 mL IVPB  Status:  Discontinued     3.375 g 100 mL/hr over 30 Minutes Intravenous Every 6 hours 07/24/17 2006 07/29/17 0518   07/24/17 2100  anidulafungin (ERAXIS) 100 mg in sodium chloride 0.9 % 100 mL IVPB  Status:  Discontinued     100 mg 78 mL/hr over 100 Minutes Intravenous Every 24  hours 07/23/17 2039 08/05/17 0945   07/24/17 0000  anidulafungin (ERAXIS) 100 mg in sodium chloride 0.9 % 100 mL IVPB  Status:  Discontinued     100 mg 78 mL/hr over 100 Minutes Intravenous Every 24 hours 07/23/17 2038 07/23/17 2039   07/23/17 2200  piperacillin-tazobactam (ZOSYN) IVPB 3.375 g  Status:  Discontinued     3.375 g 12.5 mL/hr over 240 Minutes Intravenous Every 8 hours 07/23/17 2043 07/24/17 2006   07/23/17 2100  anidulafungin (ERAXIS) 200 mg in sodium chloride 0.9 % 200 mL IVPB     200 mg 78 mL/hr over 200 Minutes Intravenous  Once 07/23/17 2038 07/24/17 0239   07/23/17 2100  vancomycin (VANCOCIN) 1,500 mg in sodium chloride 0.9 % 500 mL IVPB     1,500 mg 250 mL/hr over 120 Minutes Intravenous  Once 07/23/17 2041 07/23/17 2317   07/23/17 1515  piperacillin-tazobactam (ZOSYN) IVPB 3.375 g     3.375 g 100 mL/hr over 30 Minutes Intravenous  Once 07/23/17 1511 07/23/17 1611      Assessment/Plan: s/p Procedure(s): Temporary Pacemaker Insertion LEFT HEART CATH AND CORONARY ANGIOGRAPHY (N/A) Perforated distal small boweland ischemia of cecum 1.S/Pexploratory laparotomy, small bowel resection without anastomosis, placement of open wound VAC system 07/23/17 Dr. Gaynelle AduEric Wilson 2. S/p Reexploration of abdomen, ileocecectomy, creation of end ileostomy,closure of abdominal wall, 07/25/17, Dr. Almond LintFaera Byerly 3. Exploratory laparotomy, drainage of abdominal abscess and evacuation of pelvic hematoma, RUQ drain, and left lateral drain placement, 07/31/17, Dr. Glenna FellowsBenjamin Hoxworth (Findings: Subdiaphragmatic and subhepatic abscesses.Large organizing pelvic hematoma. Apparent necrotic tissue left lobe of the liver.) Sepsis/shock -resolved, wbc decreasing Bacteremia - Enterobacteriaceae + E Coli, on abx per ccm, awaiting additional drain cultures Cardiac arrest - Code Blue 08/08/17 - reintubated/pressors/increase of antibiotics Extubated 12/24, off pressors  Acute respiratory failure  -per ccm Acute renal failure-CRRT discontinued 07/27/17 - creatinine continues to decrease Malnutrition/Deconditioning - Prealbumin <5last time checked, on tpn Anemia - stable  GI-needs continued dressing changes, wound isdehiscedbut not much more to do is certainly at risk, continue drains; IR consult placed for consideration of percutaneous drainage of additional undrained collections if accessible and cultures; continue IV abx Keep NPO ZO:[id number]:Vancomycin 07/23/17- 07/24/17,restarted 12/20 day 1,Zosyn 07/23/17- 12/14,anidulafungin 07/23/17 - 12/17,restarted 12/20 day 1,Meropenem 12/14 - 12/17 =>>Zosyn restarted 12/17 =>>anidulafunginrestarted 12/20 =>> Vancomycin restarted 12/20 =>>  WJX:BJYNVT:SCDs - Heparin Follow-up:Dr. Gaynelle AduEric Wilson   LOS: 25 days   Stephanie Couphristopher M [facility] 08/17/2017

## 2017-08-17 NOTE — Progress Notes (Signed)
[facility] PCCM PROGRESS NOTE  Date of Admission: 07/23/2017 Date of Consult: 07/23/2017 Referring Provider: Dr. Andrey CampanileWilson, CCS Chief Complaint: Abdominal pain  Summary: 61 yo male former smoker presented with severe abdominal pain and found to have free air in abdomen from perforated SB and ischemic cecum.  S/p resection of terminal ileum.  Required re-look for drainage of abdominal abscesses and evacuation of pelvic hematoma.  Past Medical History: ETOH, PE  Subjective:  Wants some orange juice.  Vital signs: BP (!) 155/88   Pulse 64   Temp 98.2 F (36.8 C) (Oral)   Resp (!) 27   Ht 5\' 10"  (1.778 m)   Wt 174 lb 6.1 oz (79.1 kg)   SpO2 98%   BMI 25.02 kg/m   Intake/Output: I/O last 3 completed shifts: In: 4863.5 [I.V.:4603.5; Other:60; IV Piggyback:200] Out: 4244 [Urine:3500; Drains:94; Stool:650]  Physical Exam:  General - pleasant Eyes - pupils reactive ENT - no sinus tenderness, no oral exudate, no LAN Cardiac - irregular, no murmur Chest - no wheeze, rales Abd - wound dressing clean, multiple drains Ext - no edema Skin - no rashes Neuro - alert   Discussion: 61 yo male with SB perforation, cecal ischemia complicated by sepsis with peritonitis, E coli bacteremia, multiple abdominal abscess with E coli, and acute renal failure.  Not candidate for further surgical intervention.  Assessment/Plan:  Peritonitis 2nd to SB perforation and cecal ischemia. Severe protein calorie malnutrition. - TPN in place - per CCS and IR  Sepsis from peritonitis, E coli bacteremia, multiple abdominal abscesses with E coli. - Has been on Abx since 12/04 - Continue Zosyn, no other positive cultures aside from E Coli  Chest pain with bradycardia 12/24 - s/p LHC and temp pacer placement 12/25. A fib with RVR. Hypertension. - Cardiology following appreciate input  CKD 3. - monitor renal fx  Anemia of critical illness. - F/u CBC  Hyperglycemia. - SSI  DVT prophylaxis: SQ  heparin SUP: Protonix Diet: TPN, clear liquids Goals of care: Sister is point of contact.  He has 2 daughters, but they don't have close relationship.  DNR/DNI. Disposition: Can transfer out of ICU once pacer wire is out  Follow up with Cardiology regarding need for pacemaker, if not can be transferred to stepdown  Coralyn HellingVineet Amire Gossen, MD [facility] 08/17/2017, 2:19 PM Pager:  [phone] After 3pm call: [phone] FLOW SHEET  Cultures: Blood 12/04: E coli Blood 12/10: negative Surgical wound 12/12:  Pan sensitive E. Coli Surgical drain 12/20: E coli Abdominal Abscess 12/29 >>   Antibiotics: Vancomycin 12/04 >> 12/04 Zosyn 12/04 >> 12/13 Anidulafungin 12/04 >> 12/17 Meropenem 12/14 >> 12/17 Zosyn 12/17 >>  Anidulafungin 12/19 >> 12/24  Vanco 12/19 >> 12/24  Studies: CT abd/pelvis 12/04 >> free air, SB pneumatosis, b/l inguinal hernias, b/l renal cysts Echo 12/05 >> EF 45 to 50%, mild LVH, grade 1 DD CT abd/pelvis 12/12 >> extraluminal contrast, ascites, abscess Lt hepatic lobe area, RLL consolidation, b/l effusions CT chest/abd/pelvis 12/19 >> pelvic hematoma liquefaction, perisplenic abscess, atelectasis LHC 12/25 > normal coronaries, TV pacer placed with backup rate of 40bpm. CT A/P 12/27 >multiple complicated fluid collections parasplenic and perihepatic fluid collections stable/decreased though now with multiple large abdominal/pelvic collections  Events: 12/04 Admit 12/05 Start CRRT 12/06 Ileostomy, closure of abdominal wound 12/08 Off pressors 12/09 Off CRRT 12/12 Laparotomy for abscess, evacuation of hematoma  12/16 Off precedex  12/18 Bicarb added for NG acidosis, tx 1 unit PRBC 12/19 Decompensated, PEA arrest >  intubated 12/20 IR drainage of LUQ abscess 12/25 > transferred to [facility] for LHC and temp venous pacer placement 12/27 PPM set to VVI rate 30 12/29 Drain to RLQ by IR  Lines/tubes: Lt Cylinder CVL 12/04 >> 12/16 ETT 12/04 >> 12/04 RUE PICC  12/16 >>  ETT 12/20 >> 12/24 R IJ temp venous pacer 12/26 >   Consults: 12/05 Renal, s/o 12/12 12/19 Wound care 12/20 IR  12/25 Cardiology 12/26 Palliative  Resolved Problems: Septic shock, PEA cardiac arrest, acute hypoxic respiratory failure with inability to protect the airway, hyperchloremic non anion gap acidosis, acute renal failure with ATN  Labs: CMP Latest Ref Rng & Units 08/16/2017 08/15/2017 08/14/2017  Glucose 65 - 99 mg/dL 960(A) 540(J) 811(B)  BUN 6 - 20 mg/dL 14(N) 82(N) 56(O)  Creatinine 0.61 - 1.24 mg/dL 1.30(Q) 6.57(Q) 4.69(G)  Sodium 135 - 145 mmol/L 150(H) 148(H) 144  Potassium 3.5 - 5.1 mmol/L 3.6 3.7 3.2(L)  Chloride 101 - 111 mmol/L 119(H) 121(H) 122(H)  CO2 22 - 32 mmol/L 24 20(L) 17(L)  Calcium 8.9 - 10.3 mg/dL 2.9(B) 8.3(L) 8.4(L)  Total Protein 6.5 - 8.1 g/dL - 6.6 -  Total Bilirubin 0.3 - 1.2 mg/dL - 1.2 -  Alkaline Phos 38 - 126 U/L - 142(H) -  AST 15 - 41 U/L - 375(H) -  ALT 17 - 63 U/L - 145(H) -    CBC Latest Ref Rng & Units 08/16/2017 08/15/2017 08/14/2017  WBC 4.0 - 10.5 K/uL 17.4(H) 18.3(H) 14.2(H)  Hemoglobin 13.0 - 17.0 g/dL 2.8(U) 1.3(K) 4.4(W)  Hematocrit 39.0 - 52.0 % 26.5(L) 28.2(L) 25.5(L)  Platelets 150 - 400 K/uL 568(H) 587(H) 523(H)    CBG (last 3)  Recent Labs    08/16/17 1827 08/17/17 0545 08/17/17 1319  GLUCAP 119* 113* 109*    Imaging: Dg Chest Port 1 View  Result Date: 08/17/2017 CLINICAL DATA:  Temporary cardiac pacer. EXAM: PORTABLE CHEST 1 VIEW COMPARISON:  08/10/2017 FINDINGS: Placement of a single lead pacer from right internal jugular approach. This terminates over the right ventricle. There is also a right sided PICC line which terminates at the low SVC. Midline trachea. Borderline cardiomegaly. Mild right hemidiaphragm elevation. Possible trace right pleural fluid or thickening. No pneumothorax. No congestive failure. Bibasilar atelectasis or scar. Extubation. IMPRESSION: Placement of a pacer by a right  internal jugular approach, without acute complication. Electronically Signed   By: Jeronimo Greaves M.D.   On: 08/17/2017 10:50   Ct Image Guided Drainage By Percutaneous Catheter  Result Date: 08/17/2017 INDICATION: 61 year old male with a history of abdominal fluid collection. He has been referred for drainage EXAM: CT GUIDED DRAINAGE OF ABDOMINAL ABSCESS MEDICATIONS: The patient is currently admitted to the hospital and receiving intravenous antibiotics. The antibiotics were administered within an appropriate time frame prior to the initiation of the procedure. ANESTHESIA/SEDATION: 1.0 mg IV Versed 50 mcg IV Fentanyl Moderate Sedation Time:  17 MINUTES The patient was continuously monitored during the procedure by the interventional radiology nurse under my direct supervision. COMPLICATIONS: NONE TECHNIQUE: Informed written consent was obtained from the patient after a thorough discussion of the procedural risks, benefits and alternatives. All questions were addressed. Maximal Sterile Barrier Technique was utilized including caps, mask, sterile gowns, sterile gloves, sterile drape, hand hygiene and skin antiseptic. A timeout was performed prior to the initiation of the procedure. PROCEDURE: The right lower quadrant was prepped with chlorhexidine in a sterile fashion, and a sterile drape was applied covering the operative field. A sterile  gown and sterile gloves were used for the procedure. Local anesthesia was provided with 1% Lidocaine. Patient was positioned supine position on the CT gantry table. Scout CT was acquired for planning purposes. The patient is then prepped and draped in the usual sterile fashion. 1% lidocaine was for local anesthesia. Using CT guidance, trocar needle was advanced into the heterogeneously dense fluid collection of the right lower quadrant. Once we confirmed needle position, modified Seldinger technique was used to place a 12 French pigtail catheter drain. Once the drain was  position within the fluid collection, approximately 40 cc of dark greenish fluid aspirated. Sample was sent to the lab and for culture. Catheter was sutured in position and attached to bulb suction. Patient tolerated the procedure well and remained hemodynamically stable throughout. No complications were encountered and no significant blood loss. FINDINGS: Scout CT demonstrates heterogeneously dense fluid within the right lower quadrant, posterior to the surgical site of the right colon. Re- demonstration of multiple surgical drains of the right upper quadrant. Re- demonstration of midline abdominal wound. Approximately 40 cc of dark greenish fluid aspirated from the fluid collection. Final CT demonstrates pigtail drainage catheter within the fluid collection. IMPRESSION: Status post CT-guided drainage of fluid collection in the right lower quadrant. Sample was sent for culture and for total bilirubin given the color/consistency. Signed, Yvone NeuJaime S. Loreta AveWagner, DO [facility] Electronically Signed   By: Gilmer MorJaime  Wagner D.O.   On: 08/17/2017 12:42

## 2017-08-17 NOTE — Progress Notes (Signed)
PHARMACY - ADULT TOTAL PARENTERAL NUTRITION CONSULT NOTE   Pharmacy Consult:  TPN Indication: Prolonged ileus  Patient Measurements: Height: _0  (177.8 cm) Weight: 174 lb 6.1 oz (79.1 kg) IBW/kg (Calculated) : 73 TPN AdjBW (KG): 79.1 Body mass index is 25.02 kg/m.  Assessment:  44 YOM admitted on 07/23/17 with severe and worsening abdominal distention, pain, emesis and lack of bowel movements x3 days.  No significant PMH.  He was found to have perforated small bowel s/p repair on 07/23/17 and washout, ileocecetomy, ileostomy and closure of abdominal wall on 07/25/17.  Pharmacy consulted to manage TPN.  Patient transferred [facility] on 08/13/17 for transvenous pacer insertion.  GI: Ex-lap with drainage of abd abscess and evacuation of pelvic hematoma 12/12.  Wound is dehisced, repeat CT 12/27 >> possible perc drainage by IR.  Prealbumin low at 14.8, drain O/P 43m, ileostomy O/P 2064m(down).  LBM 12/27. PPI IV, PRN Zofran Endo: No hx DM - CBGs well controlled Insulin requirements in the past 24 hours: 4 units SSI Lytes: 12/28 labs - Na/Cl elevated, K+ 3.6 (goal >/= 4 for ileus), CO2 improved to 24, CoCa 10.5. Others WNL. Renal: CRRT 12/5 >> 12/9.  SCr down 1.98 (was 0.99), BUN 50 - UOP 1.6 ml/kg/hr, net +6.8L since admit, off bicarb gtt Pulm: extubated 12/14, re-intubated 12/20 during code and extubated again, stable on RA Cards: s/p code blue 12/20, cath 12/25 - BP elevated, now tachy (TV pacer) - IV hydralazine Hepatobil: liver necrosis - AST/ALT 375/148, alk phos elevated at 142, tbili down to 1.2, TG elevated but down to 227 (lipid providing 20% of non-protein kCal).  Neuro: A&O, pain score 0-8.  PRN Dilaudid ID: [email or web address] for E.coli abscess and bacteremia - afebrile, WBC up 18.3 TPN Access: PICC triple lumen placed 08/04/17 TPN start date: 07/26/17  Nutritional Goals (per RD rec on 12/27): 2200-2400 kCal and 118-141gm protein per day   Current Nutrition:  TPN   Plan:  Continue TPN  at 9085mr, providing 127g AA, 410g CHO, and 37g ILE for a total of 2,272 kCal, meeting 100% of patient needs Electrolytes in TPN:  no Na.  Low dose Phos, Mg, Ca.  Max acetate.  Increase K to 123 mEq/day. Add MVI and trace elements in TPN Continue moderate SSI Q6H F/U AM labs  Consider restarting D5W for hypernatremia   Joyleen Haselton D. DanMina MarbleharmD, BCPS Pager:  [phone]/29/2018, 7:31 AM

## 2017-08-17 NOTE — Procedures (Signed)
Interventional Radiology Procedure Note  Procedure: CT guided drain placement into RLQ fluid collection.  Dark, apparently bilious fluid aspirated.  ~40cc.  To bulb suction.  .  Complications: None  Recommendations:  - Routine drain care. - BID-TID sterile saline flushes - Record daily output - Do not submerge   - follow up cultures and bili   Signed,  Yvone NeuJaime S. Loreta AveWagner, DO

## 2017-08-17 NOTE — Progress Notes (Signed)
Progress Note   Subjective   Doing well today, the patient denies CP or SOB.  No new concerns  Inpatient Medications    Scheduled Meds: . alteplase  2 mg Intracatheter Once  . chlorhexidine gluconate (MEDLINE KIT)  15 mL Mouth Rinse BID  . Chlorhexidine Gluconate Cloth  6 each Topical Daily  . heparin  5,000 Units Subcutaneous Q8H  . hydrALAZINE  20 mg Intravenous Q4H  . insulin aspart  0-15 Units Subcutaneous Q6H  . mouth rinse  15 mL Mouth Rinse BID  . pantoprazole (PROTONIX) IV  40 mg Intravenous Q24H  . sodium chloride flush  10-40 mL Intracatheter Q12H  . sodium chloride flush  3 mL Intravenous Q12H  . sodium chloride flush  5 mL Intravenous Q8H   Continuous Infusions: . sodium chloride 250 mL (08/17/17 0800)  . piperacillin-tazobactam (ZOSYN)  IV Stopped (08/17/17 0716)  . TPN ADULT (ION) 90 mL/hr at 08/17/17 0800  . TPN ADULT (ION)     PRN Meds: sodium chloride, acetaminophen, acetaminophen, iopamidol, morphine injection, nitroGLYCERIN, ondansetron (ZOFRAN) IV, sodium chloride flush, sodium chloride flush   Vital Signs    Vitals:   08/17/17 0500 08/17/17 0600 08/17/17 0700 08/17/17 0800  BP: (!) 148/78 (!) 148/83 (!) 151/71 (!) 147/83  Pulse: (!) 104 (!) 103 69 76  Resp: (!) 31 (!) 29 (!) 26 (!) 28  Temp:   98.2 F (36.8 C)   TempSrc:   Oral   SpO2: 96% 96% 96% 96%  Weight:      Height:        Intake/Output Summary (Last 24 hours) at 08/17/2017 0910 Last data filed at 08/17/2017 0800 Gross per 24 hour  Intake 2518.5 ml  Output 3410 ml  Net -891.5 ml   Filed Weights   08/14/17 0600 08/15/17 0500 08/17/17 0400  Weight: 167 lb 12.3 oz (76.1 kg) 173 lb 4.5 oz (78.6 kg) 174 lb 6.1 oz (79.1 kg)    Telemetry    Sinus rhythm, rare V pacing, mostly nocturnal - Personally Reviewed  Physical Exam   GEN- The patient is ill appearing, alert and oriented x 3 today.   Head- normocephalic, atraumatic Eyes-  Sclera clear, conjunctiva pink Ears- hearing  intact Oropharynx- clear Neck- supple,  R IJ catheter in place Lungs-   normal work of breathing Heart- Regular rate and rhythm  GI- soft,  Mildly tender Extremities- no clubbing, cyanosis, or edema    Labs    Chemistry Recent Labs  Lab 08/12/17 0354 08/12/17 1030  08/14/17 0418 08/15/17 0802 08/16/17 0340  NA 125* 142   < > 144 148* 150*  K 3.9 3.7   < > 3.2* 3.7 3.6  CL 105 121*   < > 122* 121* 119*  CO2 19* 21*   < > 17* 20* 24  GLUCOSE 1,107* 126*   < > 124* 142* 132*  BUN 49* 52*   < > 47* 50* 50*  CREATININE 2.26* 2.41*   < > 2.21* 2.08* 1.98*  CALCIUM 7.1* 8.4*   < > 8.4* 8.3* 8.4*  PROT 5.5* 7.1  --   --  6.6  --   ALBUMIN 1.5* 1.6*  --   --  1.4*  --   AST 50* 49*  --   --  375*  --   ALT 27 38  --   --  145*  --   ALKPHOS 86 126  --   --  142*  --  BILITOT 0.7 2.3*  --   --  1.2  --   GFRNONAA 29* 27*   < > 30* 32* 34*  GFRAA 34* 31*   < > 35* 37* 40*  ANIONGAP 1* 0*   < > _0 < > = values in this interval not displayed.     Hematology Recent Labs  Lab 08/14/17 0418 08/15/17 0625 08/16/17 0340  WBC 14.2* 18.3* 17.4*  RBC 2.78* 3.13* 2.92*  HGB 8.2* 9.0* 8.4*  HCT 25.5* 28.2* 26.5*  MCV 91.7 90.1 90.8  MCH 29.5 28.8 28.8  MCHC 32.2 31.9 31.7  RDW 15.6* 15.5 16.0*  PLT 523* 587* 568*    Cardiac Enzymes Recent Labs  Lab 08/13/17 0447 08/13/17 1055  TROPONINI 0.10* 0.10*   No results for input(s): TROPIPOC in the last 168 hours.      Assessment & Plan    1.  Sinus bradycardia Appears vagal in nature Improved significantly (currently only at night) Hopefully we can remove temporary pacing wire soon.  CXR today reveals stable lead position  Thompson Grayer MD, [facility] 08/17/2017 9:10 AM

## 2017-08-18 DIAGNOSIS — K659 Peritonitis, unspecified: Secondary | ICD-10-CM

## 2017-08-18 LAB — GLUCOSE, CAPILLARY
GLUCOSE-CAPILLARY: 121 mg/dL — AB (ref 65–99)
GLUCOSE-CAPILLARY: 124 mg/dL — AB (ref 65–99)
Glucose-Capillary: 119 mg/dL — ABNORMAL HIGH (ref 65–99)
Glucose-Capillary: 131 mg/dL — ABNORMAL HIGH (ref 65–99)

## 2017-08-18 LAB — BASIC METABOLIC PANEL
Anion gap: 4 — ABNORMAL LOW (ref 5–15)
BUN: 55 mg/dL — AB (ref 6–20)
CHLORIDE: 117 mmol/L — AB (ref 101–111)
CO2: 26 mmol/L (ref 22–32)
Calcium: 8.2 mg/dL — ABNORMAL LOW (ref 8.9–10.3)
Creatinine, Ser: 2.08 mg/dL — ABNORMAL HIGH (ref 0.61–1.24)
GFR calc Af Amer: 37 mL/min — ABNORMAL LOW (ref >60)
GFR calc non Af Amer: 32 mL/min — ABNORMAL LOW (ref >60)
GLUCOSE: 123 mg/dL — AB (ref 65–99)
POTASSIUM: 4 mmol/L (ref 3.5–5.1)
Sodium: 147 mmol/L — ABNORMAL HIGH (ref 135–145)

## 2017-08-18 LAB — CBC
HEMATOCRIT: 26.3 % — AB (ref 39.0–52.0)
HEMOGLOBIN: 7.9 g/dL — AB (ref 13.0–17.0)
MCH: 28.6 pg (ref 26.0–34.0)
MCHC: 30 g/dL (ref 30.0–36.0)
MCV: 95.3 fL (ref 78.0–100.0)
Platelets: 553 10*3/uL — ABNORMAL HIGH (ref 150–400)
RBC: 2.76 MIL/uL — AB (ref 4.22–5.81)
RDW: 16.8 % — ABNORMAL HIGH (ref 11.5–15.5)
WBC: 16.5 10*3/uL — ABNORMAL HIGH (ref 4.0–10.5)

## 2017-08-18 MED ORDER — TRAVASOL 10 % IV SOLN
INTRAVENOUS | Status: AC
  Administered 2017-08-18: 18:00:00 via INTRAVENOUS
  Filled 2017-08-18: qty 1274.4

## 2017-08-18 MED ORDER — DIPHENHYDRAMINE HCL 50 MG/ML IJ SOLN
25.0000 mg | Freq: Once | INTRAMUSCULAR | Status: AC
  Administered 2017-08-18: 25 mg via INTRAVENOUS
  Filled 2017-08-18: qty 1

## 2017-08-18 MED ORDER — INSULIN ASPART 100 UNIT/ML ~~LOC~~ SOLN
0.0000 [IU] | Freq: Three times a day (TID) | SUBCUTANEOUS | Status: DC
  Administered 2017-08-19 – 2017-08-23 (×9): 1 [IU] via SUBCUTANEOUS

## 2017-08-18 NOTE — Progress Notes (Signed)
eLink Physician-Brief Progress Note Patient Name: Caleb Singh DOB: October 30, 1955 MRN: [id number]   Date of Service  08/18/2017  HPI/Events of Note  Patient request for sleeping aid.   eICU Interventions  Will order: 1. Benadryl 25 mg IV X 1 at 10 PM.     Intervention Category Major Interventions: Other:  Caleb Singh,Caleb Singh 08/18/2017, 8:19 PM

## 2017-08-18 NOTE — Progress Notes (Signed)
Override paced pt at 120, got loss of capture 1.5 mA, output at 5. Turned pacer settings back down to previous order of 30 bpm, 5mA, Sensitivity 2mV. Spoke with Allred, MD and made aware. Will continue to monitor.  Delories HeinzMelissa Alvina Strother, RN

## 2017-08-18 NOTE — Progress Notes (Signed)
PHARMACY - ADULT TOTAL PARENTERAL NUTRITION CONSULT NOTE   Pharmacy Consult:  TPN Indication: Prolonged ileus  Patient Measurements: Height: '5\' 10"'$  (177.8 cm) Weight: 173 lb 4.5 oz (78.6 kg) IBW/kg (Calculated) : 73 TPN AdjBW (KG): 78.6 Body mass index is 24.86 kg/m.  Assessment:  46 YOM admitted on 07/23/17 with severe and worsening abdominal distention, pain, emesis and lack of bowel movements x3 days.  No significant PMH.  He was found to have perforated small bowel s/p repair on 07/23/17 and washout, ileocecetomy, ileostomy and closure of abdominal wall on 07/25/17.  Pharmacy consulted to manage TPN.  Patient transferred to [facility] on 08/13/17 for transvenous pacer insertion.  GI: Ex-lap with drainage of abd abscess and evacuation of pelvic hematoma 12/12.  Wound is dehisced, repeat CT 12/27 >> IR drained 12/29.  Prealbumin low at 14.8, drain O/P 32m, ileostomy O/P 4060m(up).  LBM 12/27. PPI IV, PRN Zofran.   Endo: No hx DM - CBGs well controlled Insulin requirements in the past 24 hours: 2 units SSI Lytes: Na/CL down 147/117 with D5W, others WNL Renal: CRRT 12/5 >> 12/9.  SCr up 2.08 (was 0.99), BUN up to 55 - UOP 0.4 ml/kg/hr, net +17.5L since admit, off bicarb gtt Pulm: extubated 12/14, re-intubated 12/20 during code and extubated again, stable on RA Cards: s/p code blue 12/20, cath 12/25 - BP elevated, initially brady and now intermittent tachy (TV pacer, may remove soon) - IV hydralazine Hepatobil: liver necrosis - AST/ALT 375/148, alk phos elevated at 142, tbili down to 1.2, TG elevated but down to 227 (lipid providing 20% of non-protein kCal).  Neuro: A&O, pain score 0-8.  PRN morphine ID: Zosyn for E.coli abscess and bacteremia - afebrile, WBC down 16.5 TPN Access: PICC triple lumen placed 08/04/17 TPN start date: 07/26/17  Nutritional Goals (per RD rec on 12/27): 2200-2400 kCal and 118-141gm protein per day   Current Nutrition:  TPN Clear liquid diet.  Hold off on advancing  diet to evaluate drain output.   Plan:  Continue TPN at 90 ml/hr, providing 127g AA, 410g CHO, and 37g ILE for a total of 2272 kCal, meeting 100% of patient needs Electrolytes in TPN:  no Na.  Low dose Phos, Mg, Ca.  Max acetate.  Reduce K slightly to 119 mEq/day. Add MVI and trace elements in TPN Reduce SSI to sensitive Q8H D5W at 50 ml/hr per MD for hypernatremia F/U AM labs, PO intake/diet advancement to start weaning TPN   Lenny Fiumara D. DaMina MarblePharmD, BCPS Pager:  [phone]/30/2018, 11:18 AM

## 2017-08-18 NOTE — Progress Notes (Signed)
Progress Note   Subjective   Doing well today, the patient denies CP or SOB.  No new concerns  Inpatient Medications    Scheduled Meds: . alteplase  2 mg Intracatheter Once  . chlorhexidine  15 mL Mouth Rinse BID  . Chlorhexidine Gluconate Cloth  6 each Topical Daily  . heparin  5,000 Units Subcutaneous Q8H  . hydrALAZINE  20 mg Intravenous Q4H  . insulin aspart  0-9 Units Subcutaneous Q8H  . mouth rinse  15 mL Mouth Rinse q12n4p  . pantoprazole (PROTONIX) IV  40 mg Intravenous Q24H  . sodium chloride flush  10-40 mL Intracatheter Q12H  . sodium chloride flush  5 mL Intravenous Q8H   Continuous Infusions: . dextrose 50 mL/hr at 08/18/17 0700  . piperacillin-tazobactam (ZOSYN)  IV Stopped (08/18/17 0600)  . TPN ADULT (ION) 90 mL/hr at 08/18/17 0700   PRN Meds: acetaminophen, iopamidol, morphine injection, nitroGLYCERIN, ondansetron (ZOFRAN) IV, sodium chloride flush   Vital Signs    Vitals:   08/18/17 0500 08/18/17 0600 08/18/17 0700 08/18/17 0727  BP: 133/70 (!) 156/75 (!) 172/82 (!) 154/80  Pulse: 76 76 77 (!) 105  Resp: (!) 31 (!) 35 (!) 28 (!) 33  Temp:      TempSrc:      SpO2: 97% 96% 98% 97%  Weight:      Height:        Intake/Output Summary (Last 24 hours) at 08/18/2017 0756 Last data filed at 08/18/2017 0700 Gross per 24 hour  Intake 3138 ml  Output 1295 ml  Net 1843 ml   Filed Weights   08/15/17 0500 08/17/17 0400 08/18/17 0400  Weight: 173 lb 4.5 oz (78.6 kg) 174 lb 6.1 oz (79.1 kg) 173 lb 4.5 oz (78.6 kg)    Telemetry    Sinus rhythm, several sinus pauses overnight - Personally Reviewed  Physical Exam   GEN- The patient is ill appearing, alert and oriented x 3 today.   Head- normocephalic, atraumatic Eyes-  Sclera clear, conjunctiva pink Ears- hearing intact Neck- supple,  R IJ catheter in place Lungs-   normal work of breathing Heart- Regular rate and rhythm  GI- dressing in place, nursing currently changing the dressing Extremities-  no clubbing, cyanosis, or edema  MS- no significant deformity or atrophy Psych- flat affect   Labs    Chemistry Recent Labs  Lab 08/12/17 0354 08/12/17 1030  08/15/17 0802 08/16/17 0340 08/18/17 0401  NA 125* 142   < > 148* 150* 147*  K 3.9 3.7   < > 3.7 3.6 4.0  CL 105 121*   < > 121* 119* 117*  CO2 19* 21*   < > 20* 24 26  GLUCOSE 1,107* 126*   < > 142* 132* 123*  BUN 49* 52*   < > 50* 50* 55*  CREATININE 2.26* 2.41*   < > 2.08* 1.98* 2.08*  CALCIUM 7.1* 8.4*   < > 8.3* 8.4* 8.2*  PROT 5.5* 7.1  --  6.6  --   --   ALBUMIN 1.5* 1.6*  --  1.4*  --   --   AST 50* 49*  --  375*  --   --   ALT 27 38  --  145*  --   --   ALKPHOS 86 126  --  142*  --   --   BILITOT 0.7 2.3*  --  1.2  --   --   GFRNONAA 29* 27*   < >  32* 34* 32*  GFRAA 34* 31*   < > 37* 40* 37*  ANIONGAP 1* 0*   < > 7 7 4*   < > = values in this interval not displayed.     Hematology Recent Labs  Lab 08/15/17 0625 08/16/17 0340 08/18/17 0401  WBC 18.3* 17.4* 16.5*  RBC 3.13* 2.92* 2.76*  HGB 9.0* 8.4* 7.9*  HCT 28.2* 26.5* 26.3*  MCV 90.1 90.8 95.3  MCH 28.8 28.8 28.6  MCHC 31.9 31.7 30.0  RDW 15.5 16.0* 16.8*  PLT 587* 568* 553*    Cardiac Enzymes Recent Labs  Lab 08/13/17 0447 08/13/17 1055  TROPONINI 0.10* 0.10*   No results for input(s): TROPIPOC in the last 168 hours.      Assessment & Plan    1.  Sinus bradycardia Continues to require occasional pacing from temp wire Poor candidate for PPM Hopefully bradycardia (which appears vagal in etiology) will improve with improvement of underlying GI issues.   No new recs at this time  Hillis RangeJames Humna Moorehouse MD, [facility] 08/18/2017 7:56 AM

## 2017-08-18 NOTE — Progress Notes (Signed)
Patient ID: Caleb Singh, male   DOB: 1955/08/26, 61 y.o.   MRN: [id number]  This NP visited patient at the bedside as a follow up for palliative needs and emotional support.  Patient is weak and tachypnea.  He is alert and oriented and reports a heaviness in his lower abdomen, firm but non tender.  RN made aware  He wishes to  continue to treat the treatable and remains hopeful for improvement.  Discussed  with patient and demonstrated relaxation breathing techniques.  We also discussed visualizing a calm peaceful place to enhance his comfort.  [place].  PMT will continue to support holistically.  Patient and family are interested in securing  HPOA, will notify spiritual care    Questions and concerns addressed   Discussed with bedside RN  Time in  0730         Time out  0755   Total time spent on the  unit was 25 minutes  Greater than 50% of the time was spent in counseling and coordination of care  Palliative medicine will continue to support holistically  Lorinda CreedMary Jamela Cumbo NP  Palliative Medicine Team Team Phone # [phone] Pager [phone]

## 2017-08-18 NOTE — Progress Notes (Signed)
[facility] PCCM PROGRESS NOTE  Date of Admission: 07/23/2017 Date of Consult: 07/23/2017 Referring Provider: Dr. Andrey CampanileWilson, CCS Chief Complaint: Abdominal pain  Summary: 61 yo male former smoker presented with severe abdominal pain and found to have free air in abdomen from perforated SB and ischemic cecum.  S/p resection of terminal ileum.  Required re-look for drainage of abdominal abscesses and evacuation of pelvic hematoma.  Past Medical History: ETOH, PE  Subjective:  Feels bloated.  Still having episodes of bradycardia and pauses when he coughs or clears his throat.  Vital signs: BP (!) 156/93   Pulse (!) 104   Temp (!) 97.5 F (36.4 C) (Oral)   Resp (!) 40   Ht 5\' 10"  (1.778 m)   Wt 173 lb 4.5 oz (78.6 kg)   SpO2 98%   BMI 24.86 kg/m   Intake/Output: I/O last 3 completed shifts: In: 4403 [I.V.:4178; Other:25; IV Piggyback:200] Out: 3535 [Urine:3000; Drains:135; Stool:400]  Physical Exam:  General - pleasant Eyes - pupils reactive ENT - no sinus tenderness, no oral exudate, no LAN Cardiac - irregular, no murmur Chest - no wheeze, rales Abd - multiple drains, wound dressings clean Ext - no edema Skin - no rashes Neuro - alert   Discussion: 61 yo male with SB perforation, cecal ischemia complicated by sepsis with peritonitis, E coli bacteremia, multiple abdominal abscess with E coli, and acute renal failure.  Not candidate for further surgical intervention.  [facility] to [facility]adycardia and pauses requiring transvenous pacer.  Felt to be related to vagal response.  Assessment/Plan:  Peritonitis 2nd to SB perforation and cecal ischemia. Severe protein calorie malnutrition. - post op care, nutrition per CCS  Sepsis from peritonitis, E coli bacteremia, multiple abdominal abscesses with E coli. - Has been on Abx since 12/04 - Continue Zosyn, no other positive cultures aside from E Coli  Chest pain with bradycardia 12/24 - s/p LHC and temp pacer  placement 12/25. A fib with RVR. Hypertension. - transvenous pacer per cardiology  CKD 3. - monitor renal fx  Anemia of critical illness. - F/u CBC  Hyperglycemia. - SSI  DVT prophylaxis: SQ heparin SUP: Protonix Diet: TPN, clear liquids Goals of care: Sister is point of contact.  He has 2 daughters, but they don't have close relationship.  DNR/DNI. Disposition: Can transfer out of ICU once pacer wire is out   Coralyn HellingVineet Bonny Egger, MD [facility] 08/18/2017, 2:01 PM Pager:  [phone] After 3pm call: [phone] FLOW SHEET  Cultures: Blood 12/04: E coli Blood 12/10: negative Surgical wound 12/12:  Pan sensitive E. Coli Surgical drain 12/20: E coli Abdominal Abscess 12/29 >>   Antibiotics: Vancomycin 12/04 >> 12/04 Zosyn 12/04 >> 12/13 Anidulafungin 12/04 >> 12/17 Meropenem 12/14 >> 12/17 Zosyn 12/17 >>  Anidulafungin 12/19 >> 12/24  Vanco 12/19 >> 12/24  Studies: CT abd/pelvis 12/04 >> free air, SB pneumatosis, b/l inguinal hernias, b/l renal cysts Echo 12/05 >> EF 45 to 50%, mild LVH, grade 1 DD CT abd/pelvis 12/12 >> extraluminal contrast, ascites, abscess Lt hepatic lobe area, RLL consolidation, b/l effusions CT chest/abd/pelvis 12/19 >> pelvic hematoma liquefaction, perisplenic abscess, atelectasis LHC 12/25 > normal coronaries, TV pacer placed with backup rate of 40bpm. CT A/P 12/27 >multiple complicated fluid collections parasplenic and perihepatic fluid collections stable/decreased though now with multiple large abdominal/pelvic collections  Events: 12/04 Admit 12/05 Start CRRT 12/06 Ileostomy, closure of abdominal wound 12/08 Off pressors 12/09 Off CRRT 12/12 Laparotomy for abscess, evacuation of hematoma  12/16 Off precedex  12/18 Bicarb added for NG acidosis, tx 1 unit PRBC 12/19 Decompensated, PEA arrest > intubated 12/20 IR drainage of LUQ abscess 12/25 > [facility] and temp venous pacer placement 12/27 PPM set to VVI  rate 30 12/29 Drain to RLQ by IR  Lines/tubes: Lt [place] CVL 12/04 >> 12/16 ETT 12/04 >> 12/04 RUE PICC 12/16 >>  ETT 12/20 >> 12/24 R IJ temp venous pacer 12/26 >   Consults: 12/05 Renal, s/o 12/12 12/19 Wound care 12/20 IR  12/25 Cardiology 12/26 Palliative  Resolved Problems: Septic shock, PEA cardiac arrest, acute hypoxic respiratory failure with inability to protect the airway, hyperchloremic non anion gap acidosis, acute renal failure with ATN  Labs: CMP Latest Ref Rng & Units 08/18/2017 08/16/2017 08/15/2017  Glucose 65 - 99 mg/dL 161(W123(H) 960(A132(H) 540(J142(H)  BUN 6 - 20 mg/dL 81(X55(H) 91(Y50(H) 78(G50(H)  Creatinine 0.61 - 1.24 mg/dL 9.56(O2.08(H) 1.30(Q1.98(H) 6.57(Q2.08(H)  Sodium 135 - 145 mmol/L 147(H) 150(H) 148(H)  Potassium 3.5 - 5.1 mmol/L 4.0 3.6 3.7  Chloride 101 - 111 mmol/L 117(H) 119(H) 121(H)  CO2 22 - 32 mmol/L 26 24 20(L)  Calcium 8.9 - 10.3 mg/dL 8.2(L) 8.4(L) 8.3(L)  Total Protein 6.5 - 8.1 g/dL - - 6.6  Total Bilirubin 0.3 - 1.2 mg/dL - - 1.2  Alkaline Phos 38 - 126 U/L - - 142(H)  AST 15 - 41 U/L - - 375(H)  ALT 17 - 63 U/L - - 145(H)    CBC Latest Ref Rng & Units 08/18/2017 08/16/2017 08/15/2017  WBC 4.0 - 10.5 K/uL 16.5(H) 17.4(H) 18.3(H)  Hemoglobin 13.0 - 17.0 g/dL 7.9(L) 8.4(L) 9.0(L)  Hematocrit 39.0 - 52.0 % 26.3(L) 26.5(L) 28.2(L)  Platelets 150 - 400 K/uL 553(H) 568(H) 587(H)    CBG (last 3)  Recent Labs    08/17/17 2325 08/18/17 0615 08/18/17 1153  GLUCAP 134* 121* 119*    Imaging: Dg Chest Port 1 View  Result Date: 08/17/2017 CLINICAL DATA:  Temporary cardiac pacer. EXAM: PORTABLE CHEST 1 VIEW COMPARISON:  08/10/2017 FINDINGS: Placement of a single lead pacer from right internal jugular approach. This terminates over the right ventricle. There is also a right sided PICC line which terminates at the low SVC. Midline trachea. Borderline cardiomegaly. Mild right hemidiaphragm elevation. Possible trace right pleural fluid or thickening. No pneumothorax. No  congestive failure. Bibasilar atelectasis or scar. Extubation. IMPRESSION: Placement of a pacer by a right internal jugular approach, without acute complication. Electronically Signed   By: Jeronimo GreavesKyle  Talbot M.D.   On: 08/17/2017 10:50   Ct Image Guided Drainage By Percutaneous Catheter  Result Date: 08/17/2017 INDICATION: 61 year old male with a history of abdominal fluid collection. He has been referred for drainage EXAM: CT GUIDED DRAINAGE OF ABDOMINAL ABSCESS MEDICATIONS: The patient is currently admitted to the hospital and receiving intravenous antibiotics. The antibiotics were administered within an appropriate time frame prior to the initiation of the procedure. ANESTHESIA/SEDATION: 1.0 mg IV Versed 50 mcg IV Fentanyl Moderate Sedation Time:  17 MINUTES The patient was continuously monitored during the procedure by the interventional radiology nurse under my direct supervision. COMPLICATIONS: NONE TECHNIQUE: Informed written consent was obtained from the patient after a thorough discussion of the procedural risks, benefits and alternatives. All questions were addressed. Maximal Sterile Barrier Technique was utilized including caps, mask, sterile gowns, sterile gloves, sterile drape, hand hygiene and skin antiseptic. A timeout was performed prior to the initiation of the procedure. PROCEDURE: The right lower quadrant was  prepped with chlorhexidine in a sterile fashion, and a sterile drape was applied covering the operative field. A sterile gown and sterile gloves were used for the procedure. Local anesthesia was provided with 1% Lidocaine. Patient was positioned supine position on the CT gantry table. Scout CT was acquired for planning purposes. The patient is then prepped and draped in the usual sterile fashion. 1% lidocaine was for local anesthesia. Using CT guidance, trocar needle was advanced into the heterogeneously dense fluid collection of the right lower quadrant. Once we confirmed needle position,  modified Seldinger technique was used to place a 12 French pigtail catheter drain. Once the drain was position within the fluid collection, approximately 40 cc of dark greenish fluid aspirated. Sample was sent to the lab and for culture. Catheter was sutured in position and attached to bulb suction. Patient tolerated the procedure well and remained hemodynamically stable throughout. No complications were encountered and no significant blood loss. FINDINGS: Scout CT demonstrates heterogeneously dense fluid within the right lower quadrant, posterior to the surgical site of the right colon. Re- demonstration of multiple surgical drains of the right upper quadrant. Re- demonstration of midline abdominal wound. Approximately 40 cc of dark greenish fluid aspirated from the fluid collection. Final CT demonstrates pigtail drainage catheter within the fluid collection. IMPRESSION: Status post CT-guided drainage of fluid collection in the right lower quadrant. Sample was sent for culture and for total bilirubin given the color/consistency. Signed, Yvone Neu. Loreta Ave, DO [facility] Rad[facility] Electronically Signed   By: Gilmer Mor D.O.   On: 08/17/2017 12:42

## 2017-08-18 NOTE — Progress Notes (Signed)
Subjective/Chief Complaint: Awake, alert in good spirits, denies abdominal pain. On rounds, was singing the J-E-[id number] song. Excited to have some clears. IR for drainage done - collection behind anastomosis drained and 40cc bilious fluid aspirated - 20 cc bilious fluid since.  Objective: Vital signs in last 24 hours: Temp:  [98 F (36.7 C)-99.2 F (37.3 C)] 98.6 F (37 C) (12/30 0300) Pulse Rate:  [59-122] 70 (12/30 0800) Resp:  [25-46] 31 (12/30 0800) BP: (117-172)/(60-90) 154/80 (12/30 0727) SpO2:  [95 %-99 %] 97 % (12/30 0800) Weight:  [78.6 kg (173 lb 4.5 oz)] 78.6 kg (173 lb 4.5 oz) (12/30 0400) Last BM Date: 08/17/17(Ostomy)  Intake/Output from previous day: 12/29 0701 - 12/30 0700 In: 3138 [I.V.:2978; IV Piggyback:150] Out: 1295 [Urine:800; Drains:95; Stool:400] Intake/Output this shift: No intake/output data recorded.  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 ABG No results for input(s): PHART, HCO3 in the last 72 hours.  Invalid input(s): PCO2, PO2  Studies/Results: Dg Chest Port 1 View  Result Date: 08/17/2017 CLINICAL DATA:  Temporary cardiac pacer. EXAM: PORTABLE CHEST 1 VIEW COMPARISON:  08/10/2017 FINDINGS: Placement of a single lead pacer from right internal jugular approach. This terminates over the right ventricle. There is also a right sided PICC line which terminates at the low SVC. Midline trachea. Borderline  cardiomegaly. Mild right hemidiaphragm elevation. Possible trace right pleural fluid or thickening. No pneumothorax. No congestive failure. Bibasilar atelectasis or scar. Extubation. IMPRESSION: Placement of a pacer by a right internal jugular approach, without acute complication. Electronically Signed   By: Jeronimo GreavesKyle  Talbot M.D.   On: 08/17/2017 10:50   Ct Image Guided Drainage By Percutaneous Catheter  Result Date: 08/17/2017 INDICATION: 61 year old male with a history of abdominal fluid collection. He has been referred for drainage EXAM: CT GUIDED DRAINAGE OF ABDOMINAL ABSCESS MEDICATIONS: The patient is currently admitted to the hospital and receiving intravenous antibiotics. The antibiotics were administered within an appropriate time frame prior to the initiation of the procedure. ANESTHESIA/SEDATION: 1.0 mg IV Versed 50 mcg IV Fentanyl Moderate Sedation Time:  17 MINUTES The patient was continuously monitored during the procedure by the interventional radiology nurse under my direct supervision. COMPLICATIONS: NONE TECHNIQUE: Informed written consent was obtained from the patient after a thorough discussion of the procedural risks, benefits and alternatives. All questions were addressed. Maximal Sterile Barrier Technique was utilized including caps, mask, sterile gowns, sterile gloves, sterile drape, hand hygiene and skin antiseptic. A timeout was performed prior to the initiation of the procedure. PROCEDURE: The right lower quadrant was prepped with chlorhexidine in a sterile fashion, and a sterile drape was applied covering the operative field. A sterile gown and sterile gloves were used for the procedure. Local anesthesia was provided with 1% Lidocaine. Patient was positioned supine position on the CT gantry table. Scout CT was acquired for planning purposes. The patient is  then prepped and draped in the usual sterile fashion. 1% lidocaine was for local anesthesia. Using CT guidance, trocar needle was  advanced into the heterogeneously dense fluid collection of the right lower quadrant. Once we confirmed needle position, modified Seldinger technique was used to place a 12 French pigtail catheter drain. Once the drain was position within the fluid collection, approximately 40 cc of dark greenish fluid aspirated. Sample was sent to the lab and for culture. Catheter was sutured in position and attached to bulb suction. Patient tolerated the procedure well and remained hemodynamically stable throughout. No complications were encountered and no significant blood loss. FINDINGS: Scout CT demonstrates heterogeneously dense fluid within the right lower quadrant, posterior to the surgical site of the right colon. Re- demonstration of multiple surgical drains of the right upper quadrant. Re- demonstration of midline abdominal wound. Approximately 40 cc of dark greenish fluid aspirated from the fluid collection. Final CT demonstrates pigtail drainage catheter within the fluid collection. IMPRESSION: Status post CT-guided drainage of fluid collection in the right lower quadrant. Sample was sent for culture and for total bilirubin given the color/consistency. Signed, Yvone NeuJaime S. Loreta AveWagner, DO [facility] Electronically Signed   By: Gilmer MorJaime  Wagner D.O.   On: 08/17/2017 12:42    Anti-infectives: Anti-infectives (From admission, onward)   Start     Dose/Rate Route Frequency Ordered Stop   08/10/17 0500  vancomycin (VANCOCIN) 1,500 mg in sodium chloride 0.9 % 500 mL IVPB  Status:  Discontinued     1,500 mg 250 mL/hr over 120 Minutes Intravenous Every 48 hours 08/08/17 0728 08/12/17 1018   08/08/17 2200  anidulafungin (ERAXIS) 100 mg in sodium chloride 0.9 % 100 mL IVPB  Status:  Discontinued     100 mg 78 mL/hr over 100 Minutes Intravenous Every 24 hours 08/08/17 0524 08/12/17 1018   08/08/17 0445  vancomycin (VANCOCIN) 1,500 mg in sodium chloride 0.9 % 500 mL IVPB      1,500 mg 250 mL/hr over 120 Minutes Intravenous  Once 08/08/17 0431 08/08/17 0659   08/08/17 0430  vancomycin (VANCOCIN) 1,250 mg in sodium chloride 0.9 % 250 mL IVPB  Status:  Discontinued     1,250 mg 166.7 mL/hr over 90 Minutes Intravenous  Once 08/08/17 0418 08/08/17 0428   08/08/17 0415  anidulafungin (ERAXIS) 200 mg in sodium chloride 0.9 % 200 mL IVPB     200 mg 78 mL/hr over 200 Minutes Intravenous  Once 08/08/17 0413 08/08/17 0816   08/05/17 1000  piperacillin-tazobactam (ZOSYN) IVPB 3.375 g     3.375 g 12.5 mL/hr over 240 Minutes Intravenous Every 8 hours 08/05/17 0946     08/02/17 1400  meropenem (MERREM) 1 g in sodium chloride 0.9 % 100 mL IVPB  Status:  Discontinued     1 g 200 mL/hr over 30 Minutes Intravenous Every 12 hours 08/02/17 1111 08/05/17 0945   07/29/17 0600  piperacillin-tazobactam (ZOSYN) IVPB 3.375 g  Status:  Discontinued     3.375 g 12.5 mL/hr over 240 Minutes Intravenous Every 8 hours 07/29/17 0518 08/02/17 1036   07/25/17 2000  vancomycin (VANCOCIN) IVPB 1000 mg/200 mL premix  Status:  Discontinued     1,000 mg 200 mL/hr over 60 Minutes Intravenous Every 48 hours 07/23/17 2042 07/24/17 0956   07/24/17 2200  piperacillin-tazobactam (ZOSYN) 3.375 g in dextrose 5 % 50 mL IVPB  Status:  Discontinued     3.375 g 100 mL/hr over 30 Minutes Intravenous Every 6 hours  07/24/17 2006 07/29/17 0518   07/24/17 2100  anidulafungin (ERAXIS) 100 mg in sodium chloride 0.9 % 100 mL IVPB  Status:  Discontinued     100 mg 78 mL/hr over 100 Minutes Intravenous Every 24 hours 07/23/17 2039 08/05/17 0945   07/24/17 0000  anidulafungin (ERAXIS) 100 mg in sodium chloride 0.9 % 100 mL IVPB  Status:  Discontinued     100 mg 78 mL/hr over 100 Minutes Intravenous Every 24 hours 07/23/17 2038 07/23/17 2039   07/23/17 2200  piperacillin-tazobactam (ZOSYN) IVPB 3.375 g  Status:  Discontinued     3.375 g 12.5 mL/hr over 240 Minutes Intravenous Every 8 hours 07/23/17 2043 07/24/17 2006    07/23/17 2100  anidulafungin (ERAXIS) 200 mg in sodium chloride 0.9 % 200 mL IVPB     200 mg 78 mL/hr over 200 Minutes Intravenous  Once 07/23/17 2038 07/24/17 0239   07/23/17 2100  vancomycin (VANCOCIN) 1,500 mg in sodium chloride 0.9 % 500 mL IVPB     1,500 mg 250 mL/hr over 120 Minutes Intravenous  Once 07/23/17 2041 07/23/17 2317   07/23/17 1515  piperacillin-tazobactam (ZOSYN) IVPB 3.375 g     3.375 g 100 mL/hr over 30 Minutes Intravenous  Once 07/23/17 1511 07/23/17 1611      Assessment/Plan: s/p Procedure(s): Temporary Pacemaker Insertion LEFT HEART CATH AND CORONARY ANGIOGRAPHY (N/A) Perforated distal small boweland ischemia of cecum 1.S/Pexploratory laparotomy, small bowel resection without anastomosis, placement of open wound VAC system 07/23/17 Dr. Gaynelle Adu 2. S/p Reexploration of abdomen, ileocecectomy, creation of end ileostomy,closure of abdominal wall, 07/25/17, Dr. Almond Lint 3. Exploratory laparotomy, drainage of abdominal abscess and evacuation of pelvic hematoma, RUQ drain, and left lateral drain placement, 07/31/17, Dr. Glenna Fellows (Findings: Subdiaphragmatic and subhepatic abscesses.Large organizing pelvic hematoma. Apparent necrotic tissue left lobe of the liver.) Sepsis/shock -resolved, wbc decreasing Bacteremia - Enterobacteriaceae + E Coli, on abx per ccm, awaiting additional drain cultures Cardiac arrest - Code Blue 08/08/17 - reintubated/pressors/increase of antibiotics Extubated 12/24, off pressors  Acute respiratory failure -per ccm Acute renal failure-CRRT discontinued 07/27/17 - creatinine continues to decrease Malnutrition/Deconditioning - Prealbumin <5last time checked, on tpn Anemia - stable  GI-needs continued dressing changes, wound isdehiscedbut not much more to do is certainly at risk, continue drains; IR drained collection posterior to anastomosis - small volume bilious fluid; continue IV abx Sips of clears; would  hold off on significant diet (full liquids or regular food) to ensure the output from the newly placed drain isn't a high volume fistula. ZO:[id number] 07/23/17- 07/24/17,restarted 12/20 day 1,Zosyn 07/23/17- 12/14,anidulafungin 07/23/17 - 12/17,restarted 12/20 day 1,Meropenem 12/14 - 12/17 =>>Zosyn restarted 12/17 =>>anidulafunginrestarted 12/20 =>> Vancomycin restarted 12/20 =>>  WJX:BJYN - Heparin Follow-up:Dr. Gaynelle Adu   LOS: 26 days   Stephanie Coup [facility] 08/18/2017

## 2017-08-18 NOTE — Clinical Social Work Note (Signed)
CSW continuing to follow for medically stability. CSW will assist in placement at d/c.   ParkerBridget Lorynn Moeser, [place]WA [phone]

## 2017-08-19 LAB — DIFFERENTIAL
BAND NEUTROPHILS: 0 %
BASOS ABS: 0 10*3/uL (ref 0.0–0.1)
BASOS PCT: 0 %
Band Neutrophils: 0 %
Basophils Absolute: 0.1 10*3/uL (ref 0.0–0.1)
Basophils Relative: 0 %
Blasts: 0 %
Blasts: 0 %
EOS ABS: 0.3 10*3/uL (ref 0.0–0.7)
EOS PCT: 3 %
Eosinophils Absolute: 0.5 10*3/uL (ref 0.0–0.7)
Eosinophils Relative: 2 %
Lymphocytes Relative: 13 %
Lymphocytes Relative: 15 %
Lymphs Abs: 1.9 10*3/uL (ref 0.7–4.0)
Lymphs Abs: 2.3 10*3/uL (ref 0.7–4.0)
MONO ABS: 1 10*3/uL (ref 0.1–1.0)
MONOS PCT: 1 %
MYELOCYTES: 0 %
Metamyelocytes Relative: 0 %
Metamyelocytes Relative: 0 %
Monocytes Absolute: 0.2 10*3/uL (ref 0.1–1.0)
Monocytes Relative: 4 %
Myelocytes: 0 %
NRBC: 0 /100{WBCs}
Neutro Abs: 12 10*3/uL — ABNORMAL HIGH (ref 1.7–7.7)
Neutro Abs: 12.5 10*3/uL — ABNORMAL HIGH (ref 1.7–7.7)
Neutrophils Relative %: 81 %
Neutrophils Relative %: 81 %
Other: 0 %
PROMYELOCYTES ABS: 0 %
PROMYELOCYTES ABS: 0 %
nRBC: 0 /100 WBC

## 2017-08-19 LAB — GLUCOSE, CAPILLARY
GLUCOSE-CAPILLARY: 138 mg/dL — AB (ref 65–99)
Glucose-Capillary: 118 mg/dL — ABNORMAL HIGH (ref 65–99)
Glucose-Capillary: 126 mg/dL — ABNORMAL HIGH (ref 65–99)
Glucose-Capillary: 128 mg/dL — ABNORMAL HIGH (ref 65–99)

## 2017-08-19 LAB — COMPREHENSIVE METABOLIC PANEL
ALT: 375 U/L — AB (ref 17–63)
AST: 486 U/L — AB (ref 15–41)
Albumin: 1.6 g/dL — ABNORMAL LOW (ref 3.5–5.0)
Alkaline Phosphatase: 152 U/L — ABNORMAL HIGH (ref 38–126)
Anion gap: 6 (ref 5–15)
BUN: 51 mg/dL — AB (ref 6–20)
CHLORIDE: 105 mmol/L (ref 101–111)
CO2: 23 mmol/L (ref 22–32)
CREATININE: 2 mg/dL — AB (ref 0.61–1.24)
Calcium: 8.1 mg/dL — ABNORMAL LOW (ref 8.9–10.3)
GFR calc Af Amer: 39 mL/min — ABNORMAL LOW (ref >60)
GFR calc non Af Amer: 34 mL/min — ABNORMAL LOW (ref >60)
Glucose, Bld: 384 mg/dL — ABNORMAL HIGH (ref 65–99)
Potassium: 3.9 mmol/L (ref 3.5–5.1)
SODIUM: 134 mmol/L — AB (ref 135–145)
Total Bilirubin: 1 mg/dL (ref 0.3–1.2)
Total Protein: 6.9 g/dL (ref 6.5–8.1)

## 2017-08-19 LAB — TRIGLYCERIDES: TRIGLYCERIDES: 653 mg/dL — AB (ref <150)

## 2017-08-19 LAB — PHOSPHORUS: PHOSPHORUS: 3.1 mg/dL (ref 2.5–4.6)

## 2017-08-19 LAB — PREALBUMIN
PREALBUMIN: 25 mg/dL (ref 18–38)
Prealbumin: 24 mg/dL (ref 18–38)

## 2017-08-19 LAB — MAGNESIUM: Magnesium: 2.1 mg/dL (ref 1.7–2.4)

## 2017-08-19 MED ORDER — TRAVASOL 10 % IV SOLN
INTRAVENOUS | Status: AC
  Administered 2017-08-19: 17:00:00 via INTRAVENOUS
  Filled 2017-08-19: qty 1274.4

## 2017-08-19 NOTE — Progress Notes (Signed)
Physical Therapy Treatment Patient Details Name: Caleb LaundryFrederick Burling MRN: [id number] DOB: Jan 10, 1956 Today's Date: 08/19/2017    History of Present Illness Pt is a 61 yo male admitted 07/23/17 with intestinal perforation. Now s/p resection & ileostomy on 12/5 and 12/6; s/p abscess draining 12/12. VDRF. A fib with RVR. ETT 12/20 >> 12/24. 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 PMH includes ETOH abuse, PE.     PT Comments    Pt performed transfer from bed to chair and remains to require +2 external assistance for transfer.  Pt able to take more steps and did not require the chair to be brought to him.  Pt presents with increased WOB and fatigues easily.  Plan next session to progress gait as able.  BP pre 124/65 BP post 126/69  Pt denies dizziness during session.     Follow Up Recommendations  SNF     Equipment Recommendations  Other (comment)(TBD at next venue)    Recommendations for Other Services       Precautions / Restrictions Precautions Precautions: Fall Precaution Comments: Multiple lines Restrictions Weight Bearing Restrictions: No    Mobility  Bed Mobility Overal bed mobility: Needs Assistance Bed Mobility: Supine to Sit     Supine to sit: Mod assist     General bed mobility comments: Pt able to advance LEs to edge of bed with minimal assist but remains to require mod assist to elevate trunk into sitting.  Pt tolerated sitting edge of bed unassisted with slowed righting response and occasional min assistance to correct.    Transfers Overall transfer level: Needs assistance Equipment used: 4-wheeled walker(EVA walker) Transfers: Sit to/from Stand   Stand pivot transfers: +2 physical assistance;Mod assist Squat pivot transfers: Mod assist;+2 physical assistance     General transfer comment: Cues for hand placement to and from seated surface.  pt able to push with LUE but unable to reach back before sitting.   Pt fatigued during transfer with increased WOB.    Ambulation/Gait Ambulation/Gait assistance: Mod assist;+2 physical assistance;+2 safety/equipment Ambulation Distance (Feet): 4 Feet Assistive device: 4-wheeled walker(EVA walker) Gait Pattern/deviations: Step-to pattern;Decreased stride length;Antalgic;Wide base of support Gait velocity: Decreased Gait velocity interpretation: Below normal speed for age/gender General Gait Details: Pt with improved quality of steps from bed to chair.  Pt able to perform series of forwards, backwards and side stepping to chair from bed.  Pt with improved strength and better ability to follow commands during gait training.     Stairs            Wheelchair Mobility    Modified Rankin (Stroke Patients Only)       Balance Overall balance assessment: Needs assistance Sitting-balance support: Bilateral upper extremity supported;Feet supported Sitting balance-Leahy Scale: Fair       Standing balance-Leahy Scale: Poor                              Cognition Arousal/Alertness: Awake/alert Behavior During Therapy: WFL for tasks assessed/performed Overall Cognitive Status: Within Functional Limits for tasks assessed                                        Exercises      General Comments        Pertinent Vitals/Pain Pain Assessment: 0-10 Faces Pain Scale: Hurts little more  Pain Location: abdomen with movement Pain Descriptors / Indicators: Sore Pain Intervention(s): Monitored during session;Repositioned    Home Living                      Prior Function            PT Goals (current goals can now be found in the care plan section) Acute Rehab PT Goals Patient Stated Goal: to regain PLOF Potential to Achieve Goals: Good Progress towards PT goals: Progressing toward goals    Frequency    Min 2X/week      PT Plan Current plan remains appropriate    Co-evaluation               AM-PAC PT "6 Clicks" Daily Activity  Outcome Measure  Difficulty turning over in bed (including adjusting bedclothes, sheets and blankets)?: Unable Difficulty moving from lying on back to sitting on the side of the bed? : Unable Difficulty sitting down on and standing up from a chair with arms (e.g., wheelchair, bedside commode, etc,.)?: Unable Help needed moving to and from a bed to chair (including a wheelchair)?: A Lot Help needed walking in hospital room?: A Lot Help needed climbing 3-5 steps with a railing? : Total 6 Click Score: 8    End of Session Equipment Utilized During Treatment: Gait belt Activity Tolerance: Patient tolerated treatment well;Patient limited by fatigue Patient left: in chair;with call bell/phone within reach Nurse Communication: Mobility status PT Visit Diagnosis: Muscle weakness (generalized) (M62.81);Other abnormalities of gait and mobility (R26.89)     Time: [id number] PT Time Calculation (min) (ACUTE ONLY): 31 min  Charges:  $Therapeutic Activity: 23-37 mins                    G CodesJoycelyn Rua:       Daire Okimoto, PTA pager [phone]    Florestine Aversimee J Alianny Toelle 08/19/2017, 1:53 PM

## 2017-08-19 NOTE — Progress Notes (Signed)
PHARMACY - ADULT TOTAL PARENTERAL NUTRITION CONSULT NOTE   Pharmacy Consult:  TPN Indication: Prolonged ileus  Patient Measurements: Height: '5\' 10"'$  (177.8 cm) Weight: 173 lb 4.5 oz (78.6 kg) IBW/kg (Calculated) : 73 TPN AdjBW (KG): 78.6 Body mass index is 24.86 kg/m.  Assessment:  1 YOM admitted on 07/23/17 with severe and worsening abdominal distention, pain, emesis and lack of bowel movements x3 days. No significant PMH. He was found to have perforated small bowel s/p repair on 07/23/17 and washout, ileocecetomy, ileostomy and closure of abdominal wall on 07/25/17.  Pharmacy consulted to manage TPN.  Patient transferred to [facility] on 08/13/17 for transvenous pacer insertion.  GI: Ex-lap with drainage of abd abscess and evacuation of pelvic hematoma 12/12.  Wound is dehisced, repeat CT 12/27 >> IR drained 12/29 (purulent drainagge).  Prealbumin 14.8>24, drain O/P 52m, ileostomy O/P 8512m(up).  LBM 12/30. PPI IV, PRN Zofran.  Po Intake 680 clears  Endo: No hx DM - CBGs well controlled Insulin requirements in the past 24 hours: 2 units SSI  Lytes:Na/CL down 134/105 with D5W, others WNL. Ca 8.1 adjusts to 10.02  Renal: CRRT 12/5 >> 12/9.  SCr 2 stable (was 0.99), UOP 1.4/24 hrs. I/O +16.5L total.  Pulm: extubated 12/14, re-intubated 12/20 during code and extubated again, stable on RA  Cards: s/p code blue 12/20, cath 12/25 - BP elevated, initially brady and now intermittent tachy (TV pacer) - IV hydralazine q 4 hrs  Hepatobil: liver necrosis - AST/ALT 375/148, alk phos elevated at 142, tbili down to 1.2, TG elevated but down to 227 (lipid providing 20% of non-protein kCal).   Neuro:  PRN morphine ID: Zosyn for E.coli abscess and bacteremia - afebrile, WBC pending this AM TPN Access: PICC triple lumen placed 08/04/17 TPN start date: 07/26/17  Nutritional Goals (per RD rec on 12/27): 2200-2400 kCal and 118-141gm protein per day   Current Nutrition:  TPN Clear liquid diet.  Hold off  on advancing diet to evaluate drain output.   Plan:  Continue TPN at 90 ml/hr, providing 127g AA, 410g CHO, and 37g ILE for a total of 2272 kCal, meeting 100% of patient needs Electrolytes in TPN: Normalize Na,  Low dose Phos, Mg, Ca. Cl:Ac 1:1.  Add MVI and trace elements in TPN Reduce SSI to sensitive Q8H Change IVF to 1/2 NS at 5020mr F/U AM labs, PO intake/diet advancement to start weaning TPN   Caleb Yogi S. RobAlford HighlandharmD, BCPCliffordinical Staff Pharmacist Pager [phone]/31/2018, 9:30 AM

## 2017-08-19 NOTE — Progress Notes (Signed)
Referring Physician(s): Dr Marchelle Gearingamaswamy  Supervising Physician: Ruel FavorsShick, Trevor  [facility]  [facility]  Chief Complaint:  abd abscesses  Subjective:  12/29-Procedure: CT guided drain placement into RLQ fluid collection.  Dark, apparently bilious fluid aspirated.  ~40cc.  To bulb suction.  Up in bed Weak; sleepy Complains of no rest  Allergies: Patient has no known allergies.  Medications: Prior to Admission medications   Medication Sig Start Date End Date Taking? Authorizing Provider  clindamycin (CLEOCIN) 300 MG capsule Take 1 capsule (300 mg total) by mouth 3 (three) times daily. Patient not taking: Reported on 07/23/2017 04/21/16   Dominica SeverinGramig, William, MD  oxyCODONE (OXY IR/ROXICODONE) 5 MG immediate release tablet Take 1-2 tablets (5-10 mg total) by mouth every 3 (three) hours as needed for moderate pain. Patient not taking: Reported on 07/23/2017 04/21/16   Dominica SeverinGramig, William, MD     Vital Signs: BP 127/68   Pulse (!) 108   Temp 98.5 F (36.9 C)   Resp (!) 26   Ht 5\' 10"  (1.778 m)   Wt 173 lb 4.5 oz (78.6 kg)   SpO2 95%   BMI 24.86 kg/m   Physical Exam  Constitutional: He is oriented to person, place, and time.  Abdominal: There is tenderness.  Neurological: He is alert and oriented to person, place, and time.  Skin: Skin is warm.  Site of RLQ abscess drain is clean and dry NT OP bloody 20 cc daily NGTD  Psychiatric: He has a normal mood and affect.  Nursing note and vitals reviewed.   Imaging: Ct Abdomen Pelvis Wo Contrast  Result Date: 08/15/2017 CLINICAL DATA:  61 year old male with perforated small bowel and ischemic cecum post resection. Follow-up for evaluation of abdominal abscess/hematoma. Subsequent encounter. EXAM: CT ABDOMEN AND PELVIS WITHOUT CONTRAST TECHNIQUE: Multidetector CT imaging of the abdomen and pelvis was performed following the standard protocol without IV contrast. COMPARISON:  Several prior CTs, most recent 08/08/2017 and  08/07/2017. FINDINGS: Lower chest: Basilar atelectasis/infiltrates with small pleural effusions. Pacer leads right ventricle. Hepatobiliary: There are 2 drainage catheters surrounding the liver. Drainage catheter extending into collection along the anterior superior margin of the left lobe liver with decrease in size and mass effect of abscess now measuring 2 x 5.2 x 9 cm versus prior 3.2 x 6 x 9.8 cm. Catheter extending into the abscess along the inferior posterior aspect of the right lobe liver contains small amount a gas (possibly from flushing) and has overall similar dimensions to prior exam measuring 7.8 x 1.8 x 8 cm. Hyperdense material within the gallbladder. Pancreas: Taking into account limitation by non contrast imaging, no primary pancreatic abnormality. Spleen: Pigtail catheter placed for drainage of perisplenic/ subdiaphragmatic abscess. Significant decrease in size of perisplenic abscess now with maximal thickness of 1.1 cm versus prior 4.1 cm. Adrenals/Urinary Tract: No hydronephrosis. Right renal 2 cm cyst and possibly hyperdense left renal 1 cm cyst. No adrenal lesion. Gas in the urinary bladder may be related to recent manipulation. Stomach/Bowel: Post surgery with staple line at level of proximal ascending colon. Colostomy right lower quadrant. Complex fluid collections within the abdomen and pelvis. The 2 largest fluid collections are in the lower right pericolic gutter (adjacent to suture line of proximal ascending colon) appearing slightly dense and minimally more prominent than on the prior examination now measuring 10 x 4.2 x 4.4 cm and previously measuring 9.8 x 4 x 3.9 cm. Largest collection in the pelvis adjacent to the sigmoid colon as decreased  in size now measuring 4.5 x 4.8 cm and previously measured 4.8 x 5 cm. Vascular/Lymphatic: Atherosclerotic changes aorta and iliac/femoral artery is without abdominal aortic aneurysm. Reactive lymph nodes. Reproductive: No acute abnormality.  Other: Large anterior abdominal/pelvic incision site healing by secondary intent. Small amount of fluid extends into the inguinal canal. Mild third spacing of fluid. Musculoskeletal: Prominent degenerative changes lumbar spine without change. No surrounding soft tissue abnormality to suggest spread of infection to the thoracic spine. IMPRESSION: Post surgery with staple line at level of proximal ascending colon. Colostomy right lower quadrant. Pigtail catheter placed for drainage of perisplenic/ subdiaphragmatic abscess. Significant decrease in size of perisplenic abscess now with maximal thickness of 1.1 cm versus prior 4.1 cm. Drainage catheter extending into collection along the anterior superior margin of the left lobe liver with decrease in size and decreased mass effect of abscess which now measures 2 x 5.2 x 9 cm versus prior 3.2 x 6 x 9.8 cm. Catheter extending into the abscess along the inferior posterior aspect of the right lobe liver contains small amount a gas (possibly from flushing) and has overall similar dimensions to prior exam measuring 7.8 x 1.8 x 8 cm. Complex fluid collections within the abdomen and pelvis. The 2 largest fluid collections are in the lower right pericolic gutter (adjacent to suture line of proximal ascending colon) appearing slightly dense and minimally more prominent than on the prior examination now measuring 10 x 4.2 x 4.4 cm and previously measuring 9.8 x 4 x 3.9 cm. Largest collection in the pelvis adjacent to the sigmoid colon has decreased in size now measuring 4.5 x 4.8 cm and previously measured 4.8 x 5 cm. Bibasilar atelectasis/infiltrates with small pleural effusions. Pacemaker leads in the region of the right ventricle. Electronically Signed   By: Lacy DuverneySteven  Olson M.D.   On: 08/15/2017 14:11   Dg Chest Port 1 View  Result Date: 08/17/2017 CLINICAL DATA:  Temporary cardiac pacer. EXAM: PORTABLE CHEST 1 VIEW COMPARISON:  08/10/2017 FINDINGS: Placement of a single lead  pacer from right internal jugular approach. This terminates over the right ventricle. There is also a right sided PICC line which terminates at the low SVC. Midline trachea. Borderline cardiomegaly. Mild right hemidiaphragm elevation. Possible trace right pleural fluid or thickening. No pneumothorax. No congestive failure. Bibasilar atelectasis or scar. Extubation. IMPRESSION: Placement of a pacer by a right internal jugular approach, without acute complication. Electronically Signed   By: Jeronimo GreavesKyle  Talbot M.D.   On: 08/17/2017 10:50   Ct Image Guided Drainage By Percutaneous Catheter  Result Date: 08/17/2017 INDICATION: 61 year old male with a history of abdominal fluid collection. He has been referred for drainage EXAM: CT GUIDED DRAINAGE OF ABDOMINAL ABSCESS MEDICATIONS: The patient is currently admitted to the hospital and receiving intravenous antibiotics. The antibiotics were administered within an appropriate time frame prior to the initiation of the procedure. ANESTHESIA/SEDATION: 1.0 mg IV Versed 50 mcg IV Fentanyl Moderate Sedation Time:  17 MINUTES The patient was continuously monitored during the procedure by the interventional radiology nurse under my direct supervision. COMPLICATIONS: NONE TECHNIQUE: Informed written consent was obtained from the patient after a thorough discussion of the procedural risks, benefits and alternatives. All questions were addressed. Maximal Sterile Barrier Technique was utilized including caps, mask, sterile gowns, sterile gloves, sterile drape, hand hygiene and skin antiseptic. A timeout was performed prior to the initiation of the procedure. PROCEDURE: The right lower quadrant was prepped with chlorhexidine in a sterile fashion, and a sterile drape was  applied covering the operative field. A sterile gown and sterile gloves were used for the procedure. Local anesthesia was provided with 1% Lidocaine. Patient was positioned supine position on the CT gantry table. Scout CT  was acquired for planning purposes. The patient is then prepped and draped in the usual sterile fashion. 1% lidocaine was for local anesthesia. Using CT guidance, trocar needle was advanced into the heterogeneously dense fluid collection of the right lower quadrant. Once we confirmed needle position, modified Seldinger technique was used to place a 12 French pigtail catheter drain. Once the drain was position within the fluid collection, approximately 40 cc of dark greenish fluid aspirated. Sample was sent to the lab and for culture. Catheter was sutured in position and attached to bulb suction. Patient tolerated the procedure well and remained hemodynamically stable throughout. No complications were encountered and no significant blood loss. FINDINGS: Scout CT demonstrates heterogeneously dense fluid within the right lower quadrant, posterior to the surgical site of the right colon. Re- demonstration of multiple surgical drains of the right upper quadrant. Re- demonstration of midline abdominal wound. Approximately 40 cc of dark greenish fluid aspirated from the fluid collection. Final CT demonstrates pigtail drainage catheter within the fluid collection. IMPRESSION: Status post CT-guided drainage of fluid collection in the right lower quadrant. Sample was sent for culture and for total bilirubin given the color/consistency. Signed, Yvone Neu. Loreta Ave, DO [facility] Electronically Signed   By: Gilmer Mor D.O.   On: 08/17/2017 12:42    Labs:  CBC: Recent Labs    08/14/17 0418 08/15/17 0625 08/16/17 0340 08/18/17 0401  WBC 14.2* 18.3* 17.4* 16.5*  HGB 8.2* 9.0* 8.4* 7.9*  HCT 25.5* 28.2* 26.5* 26.3*  PLT 523* 587* 568* 553*    COAGS: Recent Labs    07/23/17 1455 07/24/17 0350 07/31/17 0745 08/08/17 1329  INR 1.78 1.77 1.58 1.45    BMP: Recent Labs    08/14/17 0418 08/15/17 0802 08/16/17 0340 08/18/17 0401  NA 144 148* 150*  147*  K 3.2* 3.7 3.6 4.0  CL 122* 121* 119* 117*  CO2 17* 20* 24 26  GLUCOSE 124* 142* 132* 123*  BUN 47* 50* 50* 55*  CALCIUM 8.4* 8.3* 8.4* 8.2*  CREATININE 2.21* 2.08* 1.98* 2.08*  GFRNONAA 30* 32* 34* 32*  GFRAA 35* 37* 40* 37*    LIVER FUNCTION TESTS: Recent Labs    08/10/17 0415 08/12/17 0354 08/12/17 1030 08/15/17 0802  BILITOT 2.0* 0.7 2.3* 1.2  AST 33 50* 49* 375*  ALT 26 27 38 145*  ALKPHOS 81 86 126 142*  PROT 6.6 5.5* 7.1 6.6  ALBUMIN 1.6* 1.5* 1.6* 1.4*    Assessment and Plan:  New RLQ abscess drain placed 12/29 Will follow Plans per CCS  Electronically Signed: Annya Lizana A, PA-C 08/19/2017, 10:39 AM   I spent a total of 15 Minutes at the the patient's bedside AND on the patient's hospital floor or unit, greater than 50% of which was counseling/coordinating care for RLQ drain

## 2017-08-19 NOTE — Progress Notes (Signed)
Pharmacy Antibiotic Note  Lamar LaundryFrederick Quirarte is a 61 y.o. male admitted on 07/23/2017 with sepsis, intra-abdominal infection.  Pharmacy has been consulted for zosyn dosing.  [facility] yesterday for placement of temp wire due to episodes of bradycardia. Currently on day 26 of antibiotics- currently only on zosyn. Last abscess culture (12/20) showed pan-sensitive E Coli amd cultures from 12/29 are ngtd   Plan to repeat CT images tomorrow. Based on findings, will discuss opportunities for de-escalation with teams.   Plan: -No zosyn dose changes needed -Consider de-escalation to rocephen?  Height: 5\' 10"  (177.8 cm) Weight: 173 lb 4.5 oz (78.6 kg) IBW/kg (Calculated) : 73  Temp (24hrs), Avg:98.4 F (36.9 C), Min:97.5 F (36.4 C), Max:99.7 F (37.6 C)  Recent Labs  Lab 08/13/17 1430 08/14/17 0418 08/15/17 0625 08/15/17 0802 08/16/17 0340 08/18/17 0401  WBC 13.1* 14.2* 18.3*  --  17.4* 16.5*  CREATININE 2.31* 2.21*  --  2.08* 1.98* 2.08*    Estimated Creatinine Clearance: 37.5 mL/min (A) (by C-G formula based on SCr of 2.08 mg/dL (H)).    No Known Allergies  Antimicrobials this admission: 12/4 Vancomycin>>12/5 12/20 >>12/24 12/4 Zosyn>>12/14 12/17 >> 12/14 meropenem >> 12/17 12/4 Eraxis >> 12/17 12/20 >>12/24  Microbiology results: 12/4 MRSA PCR: neg 12/4BCx: Ecoli  (pan-sensitive)       12/5  BCID = E.coli, no resistance 12/10 BCx: NGF 12/11 UCx: NGF 12/12 abscess cx: E coli (pan-sensitive) 12/20 Abscess: E Coli (pan-sensitive) Plan:  D26 total abx (LN 12/28) >>discussed with surgery and CCM -continue Zosyn no descalation - repeat CT imaging 12/27 -  based on reports pan sensitive ecoli but pus in drains and pus pockets/abscess in abdomen > do not believe pan sens ecoli is only pathogen. Team hesitant to change abx given recent code blue despite pan-sen E Coli in cx. Per CT 12/27 abscess are slowly decreasing in size -Perc  drain per IR 12/29 - culture pending, F/U -Poor candidate for PPM   Cards bradycardia episodes with pauses, hypotension, AMS >> [facility]  [facility] on 12/24 -  Handoff from [facility] upon transfer on 12/25 (kept d/t extensive hospitalization but not updating) Anticoag: sPltc improved with stopping CRRT, HIT negative. Hep sq resumed - afib but holding off AC at this time 12/25 Temporary pacemaker placed.   ID: IAI s/p bowel perf - 12/20 Code blue, pt intubated, abx broadened  12/4 Vancomycin>>12/5 12/20 >>12/24 12/4 Zosyn>>12/14 12/17 >> 12/14 meropenem >> 12/17 12/4 Eraxis >> 12/17 12/20 >>12/24  12/4 MRSA PCR: neg 12/4BCx: Ecoli - PANSENSITIVE       12/5  BCID = E.coli, no resistance 12/10 BCx: NGF 12/11 UCx: NGF 12/12 abscess cx: E coli (PANSENSITIVE) 12/20 Abscess: E Coli (pan-sensitive) 12/29- ab abcess- ngtd  Thank you for allowing pharmacy to be a part of this patient's care.  Harland GermanAndrew Kenedi Cilia, Pharm D 08/19/2017 9:17 AM

## 2017-08-19 NOTE — Progress Notes (Signed)
[facility] PROGRESS NOTE  Date of Admission: 07/23/2017 Date of Consult: 07/23/2017 Referring Provider: Dr. Andrey Campanile, CCS Chief Complaint: Abdominal pain  Summary: 61 yo male former smoker presented with severe abdominal pain and found to have free air in abdomen from perforated SB and ischemic cecum.  S/p resection of terminal ileum.  Required re-look for drainage of abdominal abscesses and evacuation of pelvic hematoma.  Past Medical History: ETOH, PE  Subjective:  Has experienced episodic brady events with pacing capture at 30, last 24h. Have been associated with some nausea Poor sleep last few nights Denies abd pain Able to tolerate jello, clears  Vital signs: BP 127/68   Pulse (!) 108   Temp 98.5 F (36.9 C)   Resp (!) 26   Ht 5\' 10"  (1.778 m)   Wt 78.6 kg (173 lb 4.5 oz)   SpO2 95%   BMI 24.86 kg/m   Intake/Output: I/O last 3 completed shifts: In: 5576.5 [P.O.:680; I.V.:4686.5; Other:10; IV Piggyback:200] Out: 4455 [Urine:3275; Drains:130; Stool:1050]  Physical Exam:  General - thin ill appearing man Eyes - reactive ENT - OP clear, no lesions.  Cardiac - irreg irreg, no M, HR 115 Chest - no wheeze, rales Abd - multiple drains, wound dressings clean Ext - no edema Skin - no rashes Neuro - alert   Discussion: 61 yo male with SB perforation, cecal ischemia complicated by sepsis with peritonitis, E coli bacteremia, multiple abdominal abscess with E coli, and acute renal failure.  Not candidate for further surgical intervention.  Transferred from [facility] to [facility] due to bradycardia and pauses requiring transvenous pacer.  Felt to be related to vagal response.  Assessment/Plan:  Peritonitis 2nd to SB perforation and cecal ischemia. Severe protein calorie malnutrition. Drains in place No surgical intervention possible Duration of abx unclear given intra-abd sepsis. Will need to discuss with CCS  Sepsis from peritonitis, E coli bacteremia, multiple abdominal  abscesses with E coli. Abx as above, have not established a stop date abx. Continues to have purulent drainage from JP x2  Chest pain with bradycardia 12/24 - s/p LHC and temp pacer placement 12/25. A fib with RVR. Hypertension. Temp pacer in place. ? Whether this can be d/c'd soon. Poor candidate for perm pacer placement although may be an option. Appreciate EP assistance.   CKD 3. Follow UOP, BMP  Anemia of critical illness. Follow CBC  Hyperglycemia. Continue SSi per protocol  DVT prophylaxis: SQ heparin SUP: Protonix Diet: TPN, clear liquids Goals of care: Sister is point of contact.  He has 2 daughters, but they don't have close relationship.  DNR/DNI. Disposition: Can transfer out of ICU once pacer wire is out   Levy Pupa, MD, PhD 08/19/2017, 11:15 AM [place] [facility] [phone] or if no answer [phone]  FLOW SHEET  Cultures: Blood 12/04: E coli Blood 12/10: negative Surgical wound 12/12:  Pan sensitive E. Coli Surgical drain 12/20: E coli Abdominal Abscess 12/29 >>   Antibiotics: Vancomycin 12/04 >> 12/04 Zosyn 12/04 >> 12/13 Anidulafungin 12/04 >> 12/17 Meropenem 12/14 >> 12/17 Zosyn 12/17 >>  Anidulafungin 12/19 >> 12/24  Vanco 12/19 >> 12/24  Studies: CT abd/pelvis 12/04 >> free air, SB pneumatosis, b/l inguinal hernias, b/l renal cysts Echo 12/05 >> EF 45 to 50%, mild LVH, grade 1 DD CT abd/pelvis 12/12 >> extraluminal contrast, ascites, abscess Lt hepatic lobe area, RLL consolidation, b/l effusions CT chest/abd/pelvis 12/19 >> pelvic hematoma liquefaction, perisplenic abscess, atelectasis LHC 12/25 > normal coronaries, TV pacer placed  with backup rate of 40bpm. CT A/P 12/27 >multiple complicated fluid collections parasplenic and perihepatic fluid collections stable/decreased though now with multiple large abdominal/pelvic collections  Events: 12/04 Admit 12/05 Start CRRT 12/06 Ileostomy, closure of abdominal wound 12/08 Off  pressors 12/09 Off CRRT 12/12 Laparotomy for abscess, evacuation of hematoma  12/16 Off precedex  12/18 Bicarb added for NG acidosis, tx 1 unit PRBC 12/19 Decompensated, PEA arrest > intubated 12/20 IR drainage of LUQ abscess 12/25 > [facility] for LHC and temp venous pacer placement 12/27 PPM set to VVI rate 30 12/29 Drain to RLQ by IR  Lines/tubes: Lt [facility] CVL 12/04 >> 12/16 ETT 12/04 >> 12/04 RUE PICC 12/16 >>  ETT 12/20 >> 12/24 R IJ temp venous pacer 12/26 >   Consults: 12/05 Renal, s/o 12/12 12/19 Wound care 12/20 IR  12/25 Cardiology 12/26 Palliative  Resolved Problems: Septic shock, PEA cardiac arrest, acute hypoxic respiratory failure with inability to protect the airway, hyperchloremic non anion gap acidosis, acute renal failure with ATN  Labs: CMP Latest Ref Rng & Units 08/18/2017 08/16/2017 08/15/2017  Glucose 65 - 99 mg/dL 295(A123(H) 213(Y132(H) 865(H142(H)  BUN 6 - 20 mg/dL 84(O55(H) 96(E50(H) 95(M50(H)  Creatinine 0.61 - 1.24 mg/dL 8.41(L2.08(H) 2.44(W1.98(H) 1.02(V2.08(H)  Sodium 135 - 145 mmol/L 147(H) 150(H) 148(H)  Potassium 3.5 - 5.1 mmol/L 4.0 3.6 3.7  Chloride 101 - 111 mmol/L 117(H) 119(H) 121(H)  CO2 22 - 32 mmol/L 26 24 20(L)  Calcium 8.9 - 10.3 mg/dL 8.2(L) 8.4(L) 8.3(L)  Total Protein 6.5 - 8.1 g/dL - - 6.6  Total Bilirubin 0.3 - 1.2 mg/dL - - 1.2  Alkaline Phos 38 - 126 U/L - - 142(H)  AST 15 - 41 U/L - - 375(H)  ALT 17 - 63 U/L - - 145(H)    CBC Latest Ref Rng & Units 08/18/2017 08/16/2017 08/15/2017  WBC 4.0 - 10.5 K/uL 16.5(H) 17.4(H) 18.3(H)  Hemoglobin 13.0 - 17.0 g/dL 7.9(L) 8.4(L) 9.0(L)  Hematocrit 39.0 - 52.0 % 26.3(L) 26.5(L) 28.2(L)  Platelets 150 - 400 K/uL 553(H) 568(H) 587(H)    CBG (last 3)  Recent Labs    08/18/17 2321 08/19/17 0530 08/19/17 0823  GLUCAP 124* 128* 126*    Imaging: Ct Image Guided Drainage By Percutaneous Catheter  Result Date: 08/17/2017 INDICATION: 61 year old male with a history of abdominal fluid collection. He has been  referred for drainage EXAM: CT GUIDED DRAINAGE OF ABDOMINAL ABSCESS MEDICATIONS: The patient is currently admitted to the hospital and receiving intravenous antibiotics. The antibiotics were administered within an appropriate time frame prior to the initiation of the procedure. ANESTHESIA/SEDATION: 1.0 mg IV Versed 50 mcg IV Fentanyl Moderate Sedation Time:  17 MINUTES The patient was continuously monitored during the procedure by the interventional radiology nurse under my direct supervision. COMPLICATIONS: NONE TECHNIQUE: Informed written consent was obtained from the patient after a thorough discussion of the procedural risks, benefits and alternatives. All questions were addressed. Maximal Sterile Barrier Technique was utilized including caps, mask, sterile gowns, sterile gloves, sterile drape, hand hygiene and skin antiseptic. A timeout was performed prior to the initiation of the procedure. PROCEDURE: The right lower quadrant was prepped with chlorhexidine in a sterile fashion, and a sterile drape was applied covering the operative field. A sterile gown and sterile gloves were used for the procedure. Local anesthesia was provided with 1% Lidocaine. Patient was positioned supine position on the CT gantry table. Scout CT was acquired for planning purposes. The patient is then prepped and  draped in the usual sterile fashion. 1% lidocaine was for local anesthesia. Using CT guidance, trocar needle was advanced into the heterogeneously dense fluid collection of the right lower quadrant. Once we confirmed needle position, modified Seldinger technique was used to place a 12 French pigtail catheter drain. Once the drain was position within the fluid collection, approximately 40 cc of dark greenish fluid aspirated. Sample was sent to the lab and for culture. Catheter was sutured in position and attached to bulb suction. Patient tolerated the procedure well and remained hemodynamically stable throughout. No complications  were encountered and no significant blood loss. FINDINGS: Scout CT demonstrates heterogeneously dense fluid within the right lower quadrant, posterior to the surgical site of the right colon. Re- demonstration of multiple surgical drains of the right upper quadrant. Re- demonstration of midline abdominal wound. Approximately 40 cc of dark greenish fluid aspirated from the fluid collection. Final CT demonstrates pigtail drainage catheter within the fluid collection. IMPRESSION: Status post CT-guided drainage of fluid collection in the right lower quadrant. Sample was sent for culture and for total bilirubin given the color/consistency. Signed, Yvone NeuJaime S. Loreta AveWagner, DO [facility] Electronically Signed   By: Gilmer MorJaime  Wagner D.O.   On: 08/17/2017 12:42

## 2017-08-19 NOTE — Progress Notes (Signed)
6 Days Post-Op   Subjective/Chief Complaint: Patient seems to be in good spirits No abdominal pain Ostomy functioning well Tolerating clears   Objective: Vital signs in last 24 hours: Temp:  [97.9 F (36.6 C)-99.7 F (37.6 C)] 98.5 F (36.9 C) (12/31 0816) Pulse Rate:  [71-124] 108 (12/31 1034) Resp:  [25-41] 26 (12/31 1034) BP: (124-182)/(58-155) 127/68 (12/31 1000) SpO2:  [94 %-99 %] 95 % (12/31 1034) Last BM Date: 08/18/17  Intake/Output from previous day: 12/30 0701 - 12/31 0700 In: 3836.5 [P.O.:680; I.V.:3006.5; IV Piggyback:150] Out: 3515 [Urine:2575; Drains:90; Stool:850] Intake/Output this shift: Total I/O In: -  Out: 250 [Urine:250]  Frail appearing gentleman - no apparent distress Abd - soft, non-tender Ostomy - viable; functioning Left JP - some serous fluid; some thicker drainage also noted RIght drains - one drain shows old blood, one drain is serous, one drain is minimally purulent - no bile noted in any drain Lab Results:  Recent Labs    08/18/17 0401  WBC 16.5*  HGB 7.9*  HCT 26.3*  PLT 553*   BMET Recent Labs    08/18/17 0401 08/19/17 0805  NA 147* 134*  K 4.0 3.9  CL 117* 105  CO2 26 23  GLUCOSE 123* 384*  BUN 55* 51*  CREATININE 2.08* 2.00*  CALCIUM 8.2* 8.1*   PT/INR No results for input(s): LABPROT, INR in the last 72 hours. ABG No results for input(s): PHART, HCO3 in the last 72 hours.  Invalid input(s): PCO2, PO2  Studies/Results: Ct Image Guided Drainage By Percutaneous Catheter  Result Date: 08/17/2017 INDICATION: 61 year old male with a history of abdominal fluid collection. He has been referred for drainage EXAM: CT GUIDED DRAINAGE OF ABDOMINAL ABSCESS MEDICATIONS: The patient is currently admitted to the hospital and receiving intravenous antibiotics. The antibiotics were administered within an appropriate time frame prior to the initiation of the procedure. ANESTHESIA/SEDATION: 1.0 mg IV Versed 50 mcg IV Fentanyl  Moderate Sedation Time:  17 MINUTES The patient was continuously monitored during the procedure by the interventional radiology nurse under my direct supervision. COMPLICATIONS: NONE TECHNIQUE: Informed written consent was obtained from the patient after a thorough discussion of the procedural risks, benefits and alternatives. All questions were addressed. Maximal Sterile Barrier Technique was utilized including caps, mask, sterile gowns, sterile gloves, sterile drape, hand hygiene and skin antiseptic. A timeout was performed prior to the initiation of the procedure. PROCEDURE: The right lower quadrant was prepped with chlorhexidine in a sterile fashion, and a sterile drape was applied covering the operative field. A sterile gown and sterile gloves were used for the procedure. Local anesthesia was provided with 1% Lidocaine. Patient was positioned supine position on the CT gantry table. Scout CT was acquired for planning purposes. The patient is then prepped and draped in the usual sterile fashion. 1% lidocaine was for local anesthesia. Using CT guidance, trocar needle was advanced into the heterogeneously dense fluid collection of the right lower quadrant. Once we confirmed needle position, modified Seldinger technique was used to place a 12 French pigtail catheter drain. Once the drain was position within the fluid collection, approximately 40 cc of dark greenish fluid aspirated. Sample was sent to the lab and for culture. Catheter was sutured in position and attached to bulb suction. Patient tolerated the procedure well and remained hemodynamically stable throughout. No complications were encountered and no significant blood loss. FINDINGS: Scout CT demonstrates heterogeneously dense fluid within the right lower quadrant, posterior to the surgical site of the right colon.  Re- demonstration of multiple surgical drains of the right upper quadrant. Re- demonstration of midline abdominal wound. Approximately 40 cc of  dark greenish fluid aspirated from the fluid collection. Final CT demonstrates pigtail drainage catheter within the fluid collection. IMPRESSION: Status post CT-guided drainage of fluid collection in the right lower quadrant. Sample was sent for culture and for total bilirubin given the color/consistency. Signed, Yvone NeuJaime S. Loreta AveWagner, DO [facility] Electronically Signed   By: Gilmer MorJaime  Wagner D.O.   On: 08/17/2017 12:42    Anti-infectives: Anti-infectives (From admission, onward)   Start     Dose/Rate Route Frequency Ordered Stop   08/10/17 0500  vancomycin (VANCOCIN) 1,500 mg in sodium chloride 0.9 % 500 mL IVPB  Status:  Discontinued     1,500 mg 250 mL/hr over 120 Minutes Intravenous Every 48 hours 08/08/17 0728 08/12/17 1018   08/08/17 2200  anidulafungin (ERAXIS) 100 mg in sodium chloride 0.9 % 100 mL IVPB  Status:  Discontinued     100 mg 78 mL/hr over 100 Minutes Intravenous Every 24 hours 08/08/17 0524 08/12/17 1018   08/08/17 0445  vancomycin (VANCOCIN) 1,500 mg in sodium chloride 0.9 % 500 mL IVPB     1,500 mg 250 mL/hr over 120 Minutes Intravenous  Once 08/08/17 0431 08/08/17 0659   08/08/17 0430  vancomycin (VANCOCIN) 1,250 mg in sodium chloride 0.9 % 250 mL IVPB  Status:  Discontinued     1,250 mg 166.7 mL/hr over 90 Minutes Intravenous  Once 08/08/17 0418 08/08/17 0428   08/08/17 0415  anidulafungin (ERAXIS) 200 mg in sodium chloride 0.9 % 200 mL IVPB     200 mg 78 mL/hr over 200 Minutes Intravenous  Once 08/08/17 0413 08/08/17 0816   08/05/17 1000  piperacillin-tazobactam (ZOSYN) IVPB 3.375 g     3.375 g 12.5 mL/hr over 240 Minutes Intravenous Every 8 hours 08/05/17 0946     08/02/17 1400  meropenem (MERREM) 1 g in sodium chloride 0.9 % 100 mL IVPB  Status:  Discontinued     1 g 200 mL/hr over 30 Minutes Intravenous Every 12 hours 08/02/17 1111 08/05/17 0945   07/29/17 0600  piperacillin-tazobactam (ZOSYN) IVPB 3.375 g   Status:  Discontinued     3.375 g 12.5 mL/hr over 240 Minutes Intravenous Every 8 hours 07/29/17 0518 08/02/17 1036   07/25/17 2000  vancomycin (VANCOCIN) IVPB 1000 mg/200 mL premix  Status:  Discontinued     1,000 mg 200 mL/hr over 60 Minutes Intravenous Every 48 hours 07/23/17 2042 07/24/17 0956   07/24/17 2200  piperacillin-tazobactam (ZOSYN) 3.375 g in dextrose 5 % 50 mL IVPB  Status:  Discontinued     3.375 g 100 mL/hr over 30 Minutes Intravenous Every 6 hours 07/24/17 2006 07/29/17 0518   07/24/17 2100  anidulafungin (ERAXIS) 100 mg in sodium chloride 0.9 % 100 mL IVPB  Status:  Discontinued     100 mg 78 mL/hr over 100 Minutes Intravenous Every 24 hours 07/23/17 2039 08/05/17 0945   07/24/17 0000  anidulafungin (ERAXIS) 100 mg in sodium chloride 0.9 % 100 mL IVPB  Status:  Discontinued     100 mg 78 mL/hr over 100 Minutes Intravenous Every 24 hours 07/23/17 2038 07/23/17 2039   07/23/17 2200  piperacillin-tazobactam (ZOSYN) IVPB 3.375 g  Status:  Discontinued     3.375 g 12.5 mL/hr over 240 Minutes Intravenous Every 8 hours 07/23/17 2043 07/24/17 2006   07/23/17 2100  anidulafungin (ERAXIS) 200 mg in  sodium chloride 0.9 % 200 mL IVPB     200 mg 78 mL/hr over 200 Minutes Intravenous  Once 07/23/17 2038 07/24/17 0239   07/23/17 2100  vancomycin (VANCOCIN) 1,500 mg in sodium chloride 0.9 % 500 mL IVPB     1,500 mg 250 mL/hr over 120 Minutes Intravenous  Once 07/23/17 2041 07/23/17 2317   07/23/17 1515  piperacillin-tazobactam (ZOSYN) IVPB 3.375 g     3.375 g 100 mL/hr over 30 Minutes Intravenous  Once 07/23/17 1511 07/23/17 1611      Assessment/Plan: Perforated distal small boweland ischemia of cecum 1.S/Pexploratory laparotomy, small bowel resection without anastomosis, placement of open wound VAC system 07/23/17 Dr. Gaynelle AduEric Wilson 2. S/p Reexploration of abdomen, ileocecectomy, creation of end ileostomy,closure of abdominal wall, 07/25/17, Dr. Almond LintFaera Byerly 3. Exploratory  laparotomy, drainage of abdominal abscess and evacuation of pelvic hematoma, RUQ drain, and left lateral drain placement, 07/31/17, Dr. Glenna FellowsBenjamin Hoxworth (Findings:  Subdiaphragmatic and subhepatic abscesses.Large organizing pelvic hematoma. Apparent necrotic tissue left lobe of the liver.) Sepsis/shock -resolved, wbc decreasing Bacteremia - Enterobacteriaceae + E Coli, on abx per ccm, awaiting additional drain cultures Cardiac arrest - Code Blue 08/08/17 - reintubated/pressors/increase of antibiotics Extubated 12/24, off pressors  Acute respiratory failure -Extubated, pulmonary status improving Acute renal failure-CRRT discontinued 07/27/17 - creatinine continues to decrease Malnutrition/Deconditioning - Prealbumin <5last time checked, on tpn Anemia - stable  GI-needs continued dressing changes, wound isdehiscedbut not much more to do is certainly at risk, continue drains; IR drained collection posterior to anastomosis - small volume bilious fluid; continue IV abx Drainage does not appear to be bilious - will advance to full liquids XB:[id number]:Vancomycin 07/23/17- 07/24/17,restarted 12/20 day 1,Zosyn 07/23/17- 12/14,anidulafungin 07/23/17 - 12/17,restarted 12/20 day 1,Meropenem 12/14 - 12/17 =>>Zosyn restarted 12/17 =>>anidulafunginrestarted 12/20 =>> Vancomycin restarted 12/20 =>>  HYQ:MVHQVT:SCDs - Heparin Follow-up:Dr. Gaynelle AduEric Wilson    LOS: 27 days    Caleb ArmsMatthew K Mansel Singh 08/19/2017

## 2017-08-19 NOTE — Progress Notes (Signed)
Electrophysiology Rounding Note  Patient Name: Caleb LaundryFrederick Swartzentruber Date of Encounter: 08/19/2017  Primary Cardiologist: Antoine PocheHochrein (new this admission)   Subjective   The patient is doing well today.  At this time, the patient denies chest pain, shortness of breath, or any new concerns.  Inpatient Medications    Scheduled Meds: . alteplase  2 mg Intracatheter Once  . chlorhexidine  15 mL Mouth Rinse BID  . Chlorhexidine Gluconate Cloth  6 each Topical Daily  . heparin  5,000 Units Subcutaneous Q8H  . hydrALAZINE  20 mg Intravenous Q4H  . insulin aspart  0-9 Units Subcutaneous Q8H  . mouth rinse  15 mL Mouth Rinse q12n4p  . pantoprazole (PROTONIX) IV  40 mg Intravenous Q24H  . sodium chloride flush  10-40 mL Intracatheter Q12H  . sodium chloride flush  5 mL Intravenous Q8H   Continuous Infusions: . dextrose 50 mL/hr at 08/18/17 1325  . piperacillin-tazobactam (ZOSYN)  IV 3.375 g (08/19/17 0350)  . TPN ADULT (ION) 90 mL/hr at 08/18/17 1809   PRN Meds: acetaminophen, iopamidol, morphine injection, nitroGLYCERIN, ondansetron (ZOFRAN) IV, sodium chloride flush   Vital Signs    Vitals:   08/19/17 0300 08/19/17 0400 08/19/17 0500 08/19/17 0600  BP: (!) 164/85 (!) 166/92 (!) 160/78 (!) 167/58  Pulse: 96 (!) 101 91 (!) 110  Resp: (!) 31 (!) 32 (!) 32 (!) 29  Temp: 98.4 F (36.9 C)     TempSrc: Oral     SpO2: 96% 98% 97% 97%  Weight:      Height:        Intake/Output Summary (Last 24 hours) at 08/19/2017 0716 Last data filed at 08/19/2017 0700 Gross per 24 hour  Intake 3836.5 ml  Output 3515 ml  Net 321.5 ml   Filed Weights   08/15/17 0500 08/17/17 0400 08/18/17 0400  Weight: 173 lb 4.5 oz (78.6 kg) 174 lb 6.1 oz (79.1 kg) 173 lb 4.5 oz (78.6 kg)    Physical Exam    GEN- The patient is ill appearing, alert and oriented x 3 today.   Head- normocephalic, atraumatic Eyes-  Sclera clear, conjunctiva pink Ears- hearing intact Oropharynx- clear Neck- supple,  +RIJ Lungs- Clear to ausculation bilaterally, normal work of breathing Heart- Regular rate and rhythm  GI-+abdominal dressing Extremities- no clubbing, cyanosis, or edema Skin- no rash or lesion Psych- euthymic mood, full affect Neuro- strength and sensation are intact  Labs    CBC Recent Labs    08/18/17 0401 08/19/17 0457  WBC 16.5*  --   NEUTROABS  --  12.0*  HGB 7.9*  --   HCT 26.3*  --   MCV 95.3  --   PLT 553*  --    Basic Metabolic Panel Recent Labs    09/81/1912/30/18 0401  NA 147*  K 4.0  CL 117*  CO2 26  GLUCOSE 123*  BUN 55*  CREATININE 2.08*  CALCIUM 8.2*    Telemetry    Sinus rhythm, nocturnal V pacing with prolongation of P-P and R-R intervals (personally reviewed)  Radiology    Dg Chest Port 1 View  Result Date: 08/17/2017 CLINICAL DATA:  Temporary cardiac pacer. EXAM: PORTABLE CHEST 1 VIEW COMPARISON:  08/10/2017 FINDINGS: Placement of a single lead pacer from right internal jugular approach. This terminates over the right ventricle. There is also a right sided PICC line which terminates at the low SVC. Midline trachea. Borderline cardiomegaly. Mild right hemidiaphragm elevation. Possible trace right pleural fluid or thickening. No pneumothorax.  No congestive failure. Bibasilar atelectasis or scar. Extubation. IMPRESSION: Placement of a pacer by a right internal jugular approach, without acute complication. Electronically Signed   By: Jeronimo GreavesKyle  Talbot M.D.   On: 08/17/2017 10:50   Ct Image Guided Drainage By Percutaneous Catheter  Result Date: 08/17/2017 INDICATION: 61 year old male with a history of abdominal fluid collection. He has been referred for drainage EXAM: CT GUIDED DRAINAGE OF ABDOMINAL ABSCESS MEDICATIONS: The patient is currently admitted to the hospital and receiving intravenous antibiotics. The antibiotics were administered within an appropriate time frame prior to the initiation of the procedure. ANESTHESIA/SEDATION: 1.0 mg IV Versed 50 mcg IV  Fentanyl Moderate Sedation Time:  17 MINUTES The patient was continuously monitored during the procedure by the interventional radiology nurse under my direct supervision. COMPLICATIONS: NONE TECHNIQUE: Informed written consent was obtained from the patient after a thorough discussion of the procedural risks, benefits and alternatives. All questions were addressed. Maximal Sterile Barrier Technique was utilized including caps, mask, sterile gowns, sterile gloves, sterile drape, hand hygiene and skin antiseptic. A timeout was performed prior to the initiation of the procedure. PROCEDURE: The right lower quadrant was prepped with chlorhexidine in a sterile fashion, and a sterile drape was applied covering the operative field. A sterile gown and sterile gloves were used for the procedure. Local anesthesia was provided with 1% Lidocaine. Patient was positioned supine position on the CT gantry table. Scout CT was acquired for planning purposes. The patient is then prepped and draped in the usual sterile fashion. 1% lidocaine was for local anesthesia. Using CT guidance, trocar needle was advanced into the heterogeneously dense fluid collection of the right lower quadrant. Once we confirmed needle position, modified Seldinger technique was used to place a 12 French pigtail catheter drain. Once the drain was position within the fluid collection, approximately 40 cc of dark greenish fluid aspirated. Sample was sent to the lab and for culture. Catheter was sutured in position and attached to bulb suction. Patient tolerated the procedure well and remained hemodynamically stable throughout. No complications were encountered and no significant blood loss. FINDINGS: Scout CT demonstrates heterogeneously dense fluid within the right lower quadrant, posterior to the surgical site of the right colon. Re- demonstration of multiple surgical drains of the right upper quadrant. Re- demonstration of midline abdominal wound. Approximately  40 cc of dark greenish fluid aspirated from the fluid collection. Final CT demonstrates pigtail drainage catheter within the fluid collection. IMPRESSION: Status post CT-guided drainage of fluid collection in the right lower quadrant. Sample was sent for culture and for total bilirubin given the color/consistency. Signed, Yvone NeuJaime S. Loreta AveWagner, DO [facility] Electronically Signed   By: Gilmer MorJaime  Wagner D.O.   On: 08/17/2017 12:42    Assessment & Plan    1.  Sinus bradycardia With persistent occasional pacing from temp wire With ongoing medical issues, poor candidate for PPM Continue to monitor, hope is that these episodes are vagal and related to current medical issues and will resolve as he improves  Dr Graciela HusbandsKlein to see later today   Signed, Gypsy BalsamAmber Seiler, NP  08/19/2017, 7:16 AM    As above  Discussed with Dr Fawn KirkJA and hope will be that his rhythm issues will resolve without need for more intervention

## 2017-08-20 ENCOUNTER — Inpatient Hospital Stay (HOSPITAL_COMMUNITY): Payer: Non-veteran care

## 2017-08-20 LAB — CBC
HCT: 24 % — ABNORMAL LOW (ref 39.0–52.0)
Hemoglobin: 7.4 g/dL — ABNORMAL LOW (ref 13.0–17.0)
MCH: 29.1 pg (ref 26.0–34.0)
MCHC: 30.8 g/dL (ref 30.0–36.0)
MCV: 94.5 fL (ref 78.0–100.0)
PLATELETS: 465 10*3/uL — AB (ref 150–400)
RBC: 2.54 MIL/uL — ABNORMAL LOW (ref 4.22–5.81)
RDW: 16.5 % — ABNORMAL HIGH (ref 11.5–15.5)
WBC: 11.6 10*3/uL — ABNORMAL HIGH (ref 4.0–10.5)

## 2017-08-20 LAB — POCT I-STAT 3, ART BLOOD GAS (G3+)
ACID-BASE DEFICIT: 1 mmol/L (ref 0.0–2.0)
BICARBONATE: 23.1 mmol/L (ref 20.0–28.0)
O2 Saturation: 98 %
TCO2: 24 mmol/L (ref 22–32)
pCO2 arterial: 32.5 mmHg (ref 32.0–48.0)
pH, Arterial: 7.459 — ABNORMAL HIGH (ref 7.350–7.450)
pO2, Arterial: 92 mmHg (ref 83.0–108.0)

## 2017-08-20 LAB — BASIC METABOLIC PANEL
Anion gap: 6 (ref 5–15)
BUN: 47 mg/dL — AB (ref 6–20)
CALCIUM: 8 mg/dL — AB (ref 8.9–10.3)
CO2: 23 mmol/L (ref 22–32)
CREATININE: 1.87 mg/dL — AB (ref 0.61–1.24)
Chloride: 104 mmol/L (ref 101–111)
GFR calc non Af Amer: 37 mL/min — ABNORMAL LOW (ref >60)
GFR, EST AFRICAN AMERICAN: 42 mL/min — AB (ref >60)
GLUCOSE: 381 mg/dL — AB (ref 65–99)
Potassium: 3.7 mmol/L (ref 3.5–5.1)
Sodium: 133 mmol/L — ABNORMAL LOW (ref 135–145)

## 2017-08-20 LAB — GLUCOSE, CAPILLARY
GLUCOSE-CAPILLARY: 107 mg/dL — AB (ref 65–99)
GLUCOSE-CAPILLARY: 129 mg/dL — AB (ref 65–99)
Glucose-Capillary: 121 mg/dL — ABNORMAL HIGH (ref 65–99)
Glucose-Capillary: 123 mg/dL — ABNORMAL HIGH (ref 65–99)

## 2017-08-20 LAB — MAGNESIUM: MAGNESIUM: 2 mg/dL (ref 1.7–2.4)

## 2017-08-20 MED ORDER — SODIUM CHLORIDE 0.9 % IV SOLN: Freq: Once | INTRAVENOUS | Status: DC

## 2017-08-20 MED ORDER — LABETALOL HCL 5 MG/ML IV SOLN
10.0000 mg | INTRAVENOUS | Status: DC | PRN
  Administered 2017-08-20: 20 mg via INTRAVENOUS
  Administered 2017-08-22 – 2017-08-25 (×2): 10 mg via INTRAVENOUS
  Filled 2017-08-20 (×3): qty 4

## 2017-08-20 MED ORDER — BOOST / RESOURCE BREEZE PO LIQD CUSTOM
1.0000 | Freq: Two times a day (BID) | ORAL | Status: DC
  Administered 2017-08-20 – 2017-08-27 (×6): 1 via ORAL

## 2017-08-20 MED ORDER — TRAVASOL 10 % IV SOLN
INTRAVENOUS | Status: AC
  Administered 2017-08-20: 17:00:00 via INTRAVENOUS
  Filled 2017-08-20: qty 637.2

## 2017-08-20 MED ORDER — LORAZEPAM 2 MG/ML IJ SOLN
0.5000 mg | Freq: Two times a day (BID) | INTRAMUSCULAR | Status: DC | PRN
  Administered 2017-08-20 – 2017-08-30 (×10): 0.5 mg via INTRAVENOUS
  Filled 2017-08-20 (×10): qty 1

## 2017-08-20 NOTE — Progress Notes (Signed)
Patient has not slept for over 24hrs. He keep having frequent Pulses with rates dropping down to the 20's at times but never sustain.  Patient seems to have this feeling of impending doom. Each time he try to close his eyes for a nap his heart goes on a pulse and he awakes very frighten and heart rate runs up tachy  for a shot while then the cycle will continue. He however remain stable with heart rate 60s to80s when he is awake. Dr Graciela HusbandsKlein is aware of this and I also bought it to Dr  Jane Canaryamaswamy's attention today when he was rounding. Will continue to monitor and update.

## 2017-08-20 NOTE — Progress Notes (Signed)
Paged Dr.Wilson with General surgery d/t pt c/o severe abd pain 8/10, Morphine 2mg  given by day shift nurse without relief. Will continue to monitor.

## 2017-08-20 NOTE — Progress Notes (Signed)
Dr.wilson called back and asked RN to give Morphine 2mg  and ativan 0.5 mg IV Q 12 hours for anxiety. Asked if patient was having regular BM's and to please record them. Will adminster meds as ordered and continue to monitor.

## 2017-08-20 NOTE — Progress Notes (Signed)
[facility] PCCM PROGRESS NOTE  Date of Admission: 07/23/2017 Date of Consult: 07/23/2017 Referring Provider: Dr. Andrey Campanile, CCS Chief Complaint: Abdominal pain  Summary: 62 yo male former smoker presented with severe abdominal pain and found to have free air in abdomen from perforated SB and ischemic cecum.  S/p resection of terminal ileum.  Required re-look for drainage of abdominal abscesses and evacuation of pelvic hematoma.  Past Medical History: ETOH, PE  Subjective: 08/20/2017  - mild tachypnea conintues. RN reports that bradycardia associted with bad dream recall   Vital signs: BP (!) 166/82 (BP Location: Left Arm)   Pulse 88   Temp 98.4 F (36.9 C) (Oral)   Resp (!) 36   Ht 5\' 10"  (1.778 m)   Wt 78 kg (171 lb 15.3 oz)   SpO2 99%   BMI 24.67 kg/m   Intake/Output: I/O last 3 completed shifts: In: 6415.5 [P.O.:1020; I.V.:5105.5; Other:40; IV Piggyback:250] Out: 4325 [Urine:2965; Drains:110; Stool:1250]  Physical Exam:  General Appearance:    Looks chronically unwell  Head:    Normocephalic, without obvious abnormality, atraumatic  Eyes:    PERRL - yes conjunctiva/corneas - clear     Ears:    Normal external ear canals, both ears  Nose:   NG tube - no  Throat:  ETT TUBE - no , OG tube - n  Neck:   Supple,  No enlargement/tenderness/nodules     Lungs:     Clear to auscultation bilaterally,   Chest wall:    No deformity  Heart:    S1 and S2 normal, no murmur, CVP - no.  Pressors - no  Abdomen:     Soft, no masses, no organomegaly  Genitalia:    Not done  Rectal:   not done  Extremities:   Extremities- intact     Skin:   Intact in exposed areas .      Neurologic:   Sedation - none -> RASS - +1 . Moves all 4s - yes. CAM-ICU - mild positive for confusion      Discussion: 62 yo male with SB perforation, cecal ischemia complicated by sepsis with peritonitis, E coli bacteremia, multiple abdominal abscess with E coli, and acute renal failure.  Not candidate for further  surgical intervention.  Transferred from [facility] to [facility] due to bradycardia and pauses requiring transvenous pacer.  Felt to be related to vagal response.  Assessment/Plan:  Peritonitis 2nd to SB perforation and cecal ischemia. Severe protein calorie malnutrition.   - no change  Drains in place No surgical intervention possible Duration of abx unclear given intra-abd sepsis. Will need to discuss with CCS  Sepsis from peritonitis, E coli bacteremia, multiple abdominal abscesses with E coli.   - not on pressors Abx as above, have not established a stop date abx. Continues to have purulent drainage from JP x2  Chest pain with bradycardia 12/24 - s/p LHC and temp pacer placement 12/25. A fib with RVR. Hypertension.   - mild tachypnea + on 08/20/2017  Check ABG Temp pacer in place. ? Whether this can be d/c'd soon. Poor candidate for perm pacer placement although may be an option. Appreciate EP assistance.   CKD 3. Follow UOP, BMP  Anemia of critical illness. Follow CBC  Hyperglycemia. Continue SSi per protocol  DVT prophylaxis: SQ heparin SUP: Protonix Diet: TPN, clear liquids Goals of care: Sister is point of contact.  He has 2 daughters, but they don't have close relationship.  DNR/DNI. Disposition: Can transfer out of ICU  once pacer wire is out  Also check LFT 08/21/17    FLOW SHEET  Cultures: Blood 12/04: E coli Blood 12/10: negative Surgical wound 12/12:  Pan sensitive E. Coli Surgical drain 12/20: E coli Abdominal Abscess 12/29 >>   Antibiotics: Vancomycin 12/04 >> 12/04 Zosyn 12/04 >> 12/13 Anidulafungin 12/04 >> 12/17 Meropenem 12/14 >> 12/17 Zosyn 12/17 >>  Anidulafungin 12/19 >> 12/24  Vanco 12/19 >> 12/24  Studies: CT abd/pelvis 12/04 >> free air, SB pneumatosis, b/l inguinal hernias, b/l renal cysts Echo 12/05 >> EF 45 to 50%, mild LVH, grade 1 DD CT abd/pelvis 12/12 >> extraluminal contrast, ascites, abscess Lt hepatic lobe area, RLL consolidation,  b/l effusions CT chest/abd/pelvis 12/19 >> pelvic hematoma liquefaction, perisplenic abscess, atelectasis LHC 12/25 > normal coronaries, TV pacer placed with backup rate of 40bpm. CT A/P 12/27 >multiple complicated fluid collections parasplenic and perihepatic fluid collections stable/decreased though now with multiple large abdominal/pelvic collections  Events: 12/04 Admit 12/05 Start CRRT 12/06 Ileostomy, closure of abdominal wound 12/08 Off pressors 12/09 Off CRRT 12/12 Laparotomy for abscess, evacuation of hematoma  12/16 Off precedex  12/18 Bicarb added for NG acidosis, tx 1 unit PRBC 12/19 Decompensated, PEA arrest > intubated 12/20 IR drainage of LUQ abscess 12/25 > transferred to [facility] for LHC and temp venous pacer placement 12/27 PPM set to VVI rate 30 12/29 Drain to RLQ by IR 12/31 - Has experienced episodic brady events with pacing capture at 30, last 24h. Have been associated with some nausea Poor sleep last few nights Denies abd pain Able to tolerate jello, clears  Lines/tubes: Lt  CVL 12/04 >> 12/16 ETT 12/04 >> 12/04 RUE PICC 12/16 >>  ETT 12/20 >> 12/24 R IJ temp venous pacer 12/26 >   Consults: 12/05 Renal, s/o 12/12 12/19 Wound care 12/20 IR  12/25 Cardiology 12/26 Palliative  Resolved Problems: Septic shock, PEA cardiac arrest, acute hypoxic respiratory failure with inability to protect the airway, hyperchloremic non anion gap acidosis, acute renal failure with ATN  Labs: CMP Latest Ref Rng & Units 08/20/2017 08/19/2017 08/18/2017  Glucose 65 - 99 mg/dL 865(H) 846(N) 629(B)  BUN 6 - 20 mg/dL 28(U) 13(K) 44(W)  Creatinine 0.61 - 1.24 mg/dL 1.02(V) 2.53(G) 6.44(I)  Sodium 135 - 145 mmol/L 133(L) 134(L) 147(H)  Potassium 3.5 - 5.1 mmol/L 3.7 3.9 4.0  Chloride 101 - 111 mmol/L 104 105 117(H)  CO2 22 - 32 mmol/L 23 23 26   Calcium 8.9 - 10.3 mg/dL 8.0(L) 8.1(L) 8.2(L)  Total Protein 6.5 - 8.1 g/dL - 6.9 -  Total Bilirubin 0.3 - 1.2 mg/dL - 1.0 -   Alkaline Phos 38 - 126 U/L - 152(H) -  AST 15 - 41 U/L - 486(H) -  ALT 17 - 63 U/L - 375(H) -    CBC Latest Ref Rng & Units 08/20/2017 08/18/2017 08/16/2017  WBC 4.0 - 10.5 K/uL 11.6(H) 16.5(H) 17.4(H)  Hemoglobin 13.0 - 17.0 g/dL 7.4(L) 7.9(L) 8.4(L)  Hematocrit 39.0 - 52.0 % 24.0(L) 26.3(L) 26.5(L)  Platelets 150 - 400 K/uL 465(H) 553(H) 568(H)    CBG (last 3)  Recent Labs    08/19/17 1151 08/20/17 0027 08/20/17 0752  GLUCAP 138* 107* 129*    Imaging: No results found.            The patient is critically ill with multiple organ systems failure and requires high complexity decision making for assessment and support, frequent evaluation and titration of therapies, application of advanced monitoring technologies and extensive interpretation  of multiple databases.   Critical Care Time devoted to patient care services described in this note is  30  Minutes. This time reflects time of care of this signee Dr Kalman ShanMurali Eleen Litz. This critical care time does not reflect procedure time, or teaching time or supervisory time of PA/NP/Med student/Med Resident etc but could involve care discussion time    Dr. Kalman ShanMurali Janah Mcculloh, M.D., [facility] HospitalF.C.C.P Pulmonary and Critical Care Medicine Staff Physician [facility] Pager: [phone], If no answer or between  15:00h - 7:00h: call [phone]  08/20/2017 12:11 PM

## 2017-08-20 NOTE — Progress Notes (Signed)
Patient ID: Caleb Singh, male   DOB: 20-Mar-1956, 62 y.o.   MRN: [id number]  This NP visited patient at the bedside as a follow up for palliative needs and emotional support.  Patient remains weak, denies pain, but describes troubling nightmares last night,   He is alert and oriented today.   Placed call to sister to call for update and emotional support.  He and her family remain optimistic and are hopeful for improvement.  Again family is requesting assistance with securing H POA, will discuss with spiritual care in the morning.  PMT will continue to support holistically.    Questions and concerns addressed   Discussed with bedside RN  Time in  1130         Time out  1250   Total time spent on the  unit was 20 minutes  Greater than 50% of the time was spent in counseling and coordination of care  Palliative medicine will continue to support holistically  Lorinda CreedMary Jyoti Harju NP  Palliative Medicine Team Team Phone # [phone] Pager [phone]

## 2017-08-20 NOTE — Progress Notes (Addendum)
Electrophysiology Rounding Note  Patient Name: Caleb Singh Date of Encounter: 08/20/2017  Primary Cardiologist: Antoine Poche (new this admission)   Subjective  Doing fair, denies chest pain or shortness of breath or nausea He is eating liquids with ileostomy output  Inpatient Medications    Scheduled Meds: . alteplase  2 mg Intracatheter Once  . chlorhexidine  15 mL Mouth Rinse BID  . Chlorhexidine Gluconate Cloth  6 each Topical Daily  . heparin  5,000 Units Subcutaneous Q8H  . hydrALAZINE  20 mg Intravenous Q4H  . insulin aspart  0-9 Units Subcutaneous Q8H  . mouth rinse  15 mL Mouth Rinse q12n4p  . pantoprazole (PROTONIX) IV  40 mg Intravenous Q24H  . sodium chloride flush  10-40 mL Intracatheter Q12H  . sodium chloride flush  5 mL Intravenous Q8H   Continuous Infusions: . sodium chloride    . dextrose 50 mL/hr at 08/20/17 0938  . piperacillin-tazobactam (ZOSYN)  IV 3.375 g (08/20/17 0981)  . TPN ADULT (ION) 90 mL/hr at 08/20/17 0800   PRN Meds: acetaminophen, iopamidol, morphine injection, nitroGLYCERIN, ondansetron (ZOFRAN) IV, sodium chloride flush   Vital Signs    Vitals:   08/20/17 0600 08/20/17 0700 08/20/17 0747 08/20/17 0800  BP: 127/87 123/61 (!) 149/76 (!) 142/83  Pulse: 75 92 98 99  Resp: (!) 35 (!) 25 (!) 29 (!) 33  Temp:   98 F (36.7 C)   TempSrc:   Oral   SpO2: 98% 97% 96% 95%  Weight:      Height:        Intake/Output Summary (Last 24 hours) at 08/20/2017 1034 Last data filed at 08/20/2017 1014 Gross per 24 hour  Intake 3809 ml  Output 2210 ml  Net 1599 ml   Filed Weights   08/18/17 0400 08/19/17 1643 08/20/17 0412  Weight: 173 lb 4.5 oz (78.6 kg) 171 lb 15.3 oz (78 kg) 171 lb 15.3 oz (78 kg)    Physical Exam    Ill appearing HENT normal Neck supple with RIJ pacer in place Carotids brisk and full without bruits Clear Regular rate and rhythm, no murmurs or gallops Abd-soft with active BS without hepatomegaly No Clubbing cyanosis  legs in PCD Skin-warm and dry A & Oriented  Grossly normal sensory and motor function  Neuro- strength and sensation are intact  Labs    CBC Recent Labs    08/18/17 0401 08/19/17 0457 08/19/17 0805 08/20/17 0543  WBC 16.5*  --   --  11.6*  NEUTROABS  --  12.0* 12.5*  --   HGB 7.9*  --   --  7.4*  HCT 26.3*  --   --  24.0*  MCV 95.3  --   --  94.5  PLT 553*  --   --  465*   Basic Metabolic Panel Recent Labs    19/14/78 0805 08/20/17 0543  NA 134* 133*  K 3.9 3.7  CL 105 104  CO2 23 23  GLUCOSE 384* 381*  BUN 51* 47*  CREATININE 2.00* 1.87*  CALCIUM 8.1* 8.0*  MG 2.1 2.0  PHOS 3.1  --     Telemetry    Sinus rhythm, nocturnal V pacing with prolongation of P-P and R-R intervals (personally reviewed)  Radiology    No results found.  Assessment & Plan    Sinus bradycardia  Intraabdominal infection following perforation  Mild LV dysfunction     The mechanism of sinus slowing is not clear, but it is gradual as opposed  to abrupt suggesting that hypervagotonia is the mechanism.  This then begs the question as to trigger and it should be intrabdominal.  He denies nausea, so it is not likely something so simple and raises concern of distended viscus, fluid collections etc as trigger.  I discussed this with surgery, but ...  The timing of the events do not suggest sleep, but he may have weird sleep times. OSA can also be a trigger  Will discuss with nursing   Have spoken with them, and his impression is that most events ARE assoc with sleep  Looking at telemetry, it is not clear to me that this is resolving.  I agree that we DO NOT want to place a permanent device, but, if we dont begin to see resolution, we may be pushed into a temp=perm as risks are less

## 2017-08-20 NOTE — Progress Notes (Signed)
Patient ID: Sharbel Sahagun, male   DOB: 12-26-1955, 62 y.o.   MRN: [id number]   Acute Care Surgery Service Progress Note:    Chief Complaint/Subjective: No n/v. States cream of chicken was good; ostomy working 850cc/24hrs.   Objective: Vital signs in last 24 hours: Temp:  [97.6 F (36.4 C)-98.9 F (37.2 C)] 98 F (36.7 C) (01/01 0747) Pulse Rate:  [69-116] 99 (01/01 0800) Resp:  [21-36] 33 (01/01 0800) BP: (123-163)/(58-94) 142/83 (01/01 0800) SpO2:  [94 %-99 %] 95 % (01/01 0800) Weight:  [78 kg (171 lb 15.3 oz)] 78 kg (171 lb 15.3 oz) (01/01 0412) Last BM Date: 08/19/17  Intake/Output from previous day: 12/31 0701 - 01/01 0700 In: 4709 [P.O.:1020; I.V.:3499; IV Piggyback:150] Out: 2510 [Urine:1590; Drains:70; Stool:850] Intake/Output this shift: Total I/O In: 140 [I.V.:140] Out: 300 [Urine:300]  Lungs: cta, nonlabored  Cardiovascular: reg  Abd: Left JP - some serous fluid; some thicker drainage also noted - not really bilious RIght drains - one drain shows old blood, one drain is serous, one drain is minimally purulent - no bile noted in any drain  Extremities: no edema, +SCDs  Neuro: alert, nonfocal  Lab Results: CBC  Recent Labs    08/18/17 0401 08/20/17 0543  WBC 16.5* 11.6*  HGB 7.9* 7.4*  HCT 26.3* 24.0*  PLT 553* 465*   BMET Recent Labs    08/19/17 0805 08/20/17 0543  NA 134* 133*  K 3.9 3.7  CL 105 104  CO2 23 23  GLUCOSE 384* 381*  BUN 51* 47*  CREATININE 2.00* 1.87*  CALCIUM 8.1* 8.0*   LFT Hepatic Function Latest Ref Rng & Units 08/19/2017 08/15/2017 08/12/2017  Total Protein 6.5 - 8.1 g/dL 6.9 6.6 7.1  Albumin 3.5 - 5.0 g/dL 1.6(L) 1.4(L) 1.6(L)  AST 15 - 41 U/L 486(H) 375(H) 49(H)  ALT 17 - 63 U/L 375(H) 145(H) 38  Alk Phosphatase 38 - 126 U/L 152(H) 142(H) 126  Total Bilirubin 0.3 - 1.2 mg/dL 1.0 1.2 2.3(H)  Bilirubin, Direct 0.1 - 0.5 mg/dL - - -   PT/INR No results for input(s): LABPROT, INR in the last 72 hours. ABG No  results for input(s): PHART, HCO3 in the last 72 hours.  Invalid input(s): PCO2, PO2  Studies/Results:  Anti-infectives: Anti-infectives (From admission, onward)   Start     Dose/Rate Route Frequency Ordered Stop   08/10/17 0500  vancomycin (VANCOCIN) 1,500 mg in sodium chloride 0.9 % 500 mL IVPB  Status:  Discontinued     1,500 mg 250 mL/hr over 120 Minutes Intravenous Every 48 hours 08/08/17 0728 08/12/17 1018   08/08/17 2200  anidulafungin (ERAXIS) 100 mg in sodium chloride 0.9 % 100 mL IVPB  Status:  Discontinued     100 mg 78 mL/hr over 100 Minutes Intravenous Every 24 hours 08/08/17 0524 08/12/17 1018   08/08/17 0445  vancomycin (VANCOCIN) 1,500 mg in sodium chloride 0.9 % 500 mL IVPB     1,500 mg 250 mL/hr over 120 Minutes Intravenous  Once 08/08/17 0431 08/08/17 0659   08/08/17 0430  vancomycin (VANCOCIN) 1,250 mg in sodium chloride 0.9 % 250 mL IVPB  Status:  Discontinued     1,250 mg 166.7 mL/hr over 90 Minutes Intravenous  Once 08/08/17 0418 08/08/17 0428   08/08/17 0415  anidulafungin (ERAXIS) 200 mg in sodium chloride 0.9 % 200 mL IVPB     200 mg 78 mL/hr over 200 Minutes Intravenous  Once 08/08/17 0413 08/08/17 0816   08/05/17 1000  piperacillin-tazobactam (  ZOSYN) IVPB 3.375 g     3.375 g 12.5 mL/hr over 240 Minutes Intravenous Every 8 hours 08/05/17 0946     08/02/17 1400  meropenem (MERREM) 1 g in sodium chloride 0.9 % 100 mL IVPB  Status:  Discontinued     1 g 200 mL/hr over 30 Minutes Intravenous Every 12 hours 08/02/17 1111 08/05/17 0945   07/29/17 0600  piperacillin-tazobactam (ZOSYN) IVPB 3.375 g  Status:  Discontinued     3.375 g 12.5 mL/hr over 240 Minutes Intravenous Every 8 hours 07/29/17 0518 08/02/17 1036   07/25/17 2000  vancomycin (VANCOCIN) IVPB 1000 mg/200 mL premix  Status:  Discontinued     1,000 mg 200 mL/hr over 60 Minutes Intravenous Every 48 hours 07/23/17 2042 07/24/17 0956   07/24/17 2200  piperacillin-tazobactam (ZOSYN) 3.375 g in dextrose 5  % 50 mL IVPB  Status:  Discontinued     3.375 g 100 mL/hr over 30 Minutes Intravenous Every 6 hours 07/24/17 2006 07/29/17 0518   07/24/17 2100  anidulafungin (ERAXIS) 100 mg in sodium chloride 0.9 % 100 mL IVPB  Status:  Discontinued     100 mg 78 mL/hr over 100 Minutes Intravenous Every 24 hours 07/23/17 2039 08/05/17 0945   07/24/17 0000  anidulafungin (ERAXIS) 100 mg in sodium chloride 0.9 % 100 mL IVPB  Status:  Discontinued     100 mg 78 mL/hr over 100 Minutes Intravenous Every 24 hours 07/23/17 2038 07/23/17 2039   07/23/17 2200  piperacillin-tazobactam (ZOSYN) IVPB 3.375 g  Status:  Discontinued     3.375 g 12.5 mL/hr over 240 Minutes Intravenous Every 8 hours 07/23/17 2043 07/24/17 2006   07/23/17 2100  anidulafungin (ERAXIS) 200 mg in sodium chloride 0.9 % 200 mL IVPB     200 mg 78 mL/hr over 200 Minutes Intravenous  Once 07/23/17 2038 07/24/17 0239   07/23/17 2100  vancomycin (VANCOCIN) 1,500 mg in sodium chloride 0.9 % 500 mL IVPB     1,500 mg 250 mL/hr over 120 Minutes Intravenous  Once 07/23/17 2041 07/23/17 2317   07/23/17 1515  piperacillin-tazobactam (ZOSYN) IVPB 3.375 g     3.375 g 100 mL/hr over 30 Minutes Intravenous  Once 07/23/17 1511 07/23/17 1611      Medications: Scheduled Meds: . alteplase  2 mg Intracatheter Once  . chlorhexidine  15 mL Mouth Rinse BID  . Chlorhexidine Gluconate Cloth  6 each Topical Daily  . feeding supplement  1 Container Oral BID BM  . heparin  5,000 Units Subcutaneous Q8H  . hydrALAZINE  20 mg Intravenous Q4H  . insulin aspart  0-9 Units Subcutaneous Q8H  . mouth rinse  15 mL Mouth Rinse q12n4p  . pantoprazole (PROTONIX) IV  40 mg Intravenous Q24H  . sodium chloride flush  10-40 mL Intracatheter Q12H  . sodium chloride flush  5 mL Intravenous Q8H   Continuous Infusions: . sodium chloride    . dextrose 50 mL/hr at 08/20/17 0938  . piperacillin-tazobactam (ZOSYN)  IV 3.375 g (08/20/17 2423)  . TPN ADULT (ION) 90 mL/hr at 08/20/17  0800   PRN Meds:.acetaminophen, iopamidol, morphine injection, nitroGLYCERIN, ondansetron (ZOFRAN) IV, sodium chloride flush  Assessment/Plan: Patient Active Problem List   Diagnosis Date Noted  . Bradycardia   . Postoperative intra-abdominal abscess   . Acute encephalopathy   . DNR (do not resuscitate) discussion   . Palliative care by specialist   . Weakness generalized   . Septic shock ([place])   . Pressure injury of  skin 08/07/2017  . Tachypnea   . Acute delirium   . Intestinal perforation s/p SB resection/ileostomy 07/24/2017 & 07/25/2017 07/27/2017  . Ileostomy in place [facility]) 07/27/2017  . Acute respiratory failure ([place])   . AKI (acute kidney injury) ([place])   . Free intraperitoneal air 07/23/2017  . Abscess of third finger of right hand 04/17/2016   s/p Procedure(s): Temporary Pacemaker Insertion LEFT HEART CATH AND CORONARY ANGIOGRAPHY 08/13/2017  Perforated distal small boweland ischemia of cecum 1.S/Pexploratory laparotomy, small bowel resection without anastomosis, placement of open wound VAC system 07/23/17 Dr. Greer Pickerel 2. S/p Reexploration of abdomen, ileocecectomy, creation of end ileostomy,closure of abdominal wall, 07/25/17, Dr. [name] 3. Exploratory laparotomy, drainage of abdominal abscess and evacuation of pelvic hematoma, RUQ drain, and left lateral drain placement, 07/31/17, Dr. Excell Seltzer (Findings:  Subdiaphragmatic and subhepatic abscesses.Large organizing pelvic hematoma. Apparent necrotic tissue left lobe of the liver.) Sepsis/shock -resolved, wbc decreasing Bacteremia - Enterobacteriaceae + E Coli, on abx per ccm, awaiting additional drain cultures Cardiac arrest - Code Blue 08/08/17 - reintubated/pressors/increase of antibiotics Extubated 12/24, off pressors  Acute respiratory failure -Extubated, pulmonary status improving Acute renal failure-CRRT discontinued 07/27/17 - creatinine continues to  decrease Malnutrition/Deconditioning - Prealbumin <5last time checked, on tpn Anemia - stable  GI-needs continued dressing changes, wound isdehiscedbut not much more to do is certainly at risk, continue drains; IRdrained collection posterior to anastomosis - small volume bilious fluid; continue IV abx Drainage does not appear to be bilious - will advance to full liquids JG:[id number] 07/23/17- 07/24/17,restarted 12/20 day 1,Zosyn 07/23/17- 12/14,anidulafungin 07/23/17 - 12/17,restarted 12/20 day 1,Meropenem 12/14 - 12/17 =>>Zosyn restarted 12/17 =>>anidulafunginrestarted 12/20 =>> Vancomycin restarted 12/20 =>>  LYY:TKPT - Heparin Follow-up:Dr. Greer Pickerel   Disposition: add boost shakes; drain cultures from 12/29 NTD; wbc decreasing, no fever. Drains aren't putting out a lot; would repeat CT a/p with ORAL contrast only in next 24-36 hrs to monitor fluid collections - abx decision at that time point. No sign of intestinal fistula as of yet.   LOS: 28 days    Leighton Ruff. Redmond Pulling, MD, FACS General, Bariatric, & Minimally Invasive Surgery [phone] [facility]

## 2017-08-20 NOTE — Progress Notes (Signed)
PHARMACY - ADULT TOTAL PARENTERAL NUTRITION CONSULT NOTE   Pharmacy Consult:  TPN Indication: Prolonged ileus  Patient Measurements: Height: '5\' 10"'$  (177.8 cm) Weight: 171 lb 15.3 oz (78 kg) IBW/kg (Calculated) : 73 TPN AdjBW (KG): 78 Body mass index is 24.67 kg/m.  Assessment:  64 YOM admitted on 07/23/17 with severe and worsening abdominal distention, pain, emesis and lack of bowel movements x3 days. No significant PMH. He was found to have perforated small bowel s/p repair on 07/23/17 and washout, ileocecetomy, ileostomy and closure of abdominal wall on 07/25/17.  Pharmacy consulted to manage TPN.  Patient transferred to [facility] on 08/13/17 for transvenous pacer insertion.  GI: Ex-lap with drainage of abd abscess and evacuation of pelvic hematoma 12/12.  Wound is dehisced, repeat CT 12/27 >> IR drained 12/29 (purulent drainage).  Prealbumin 14.8>25, drain O/P 63m, ileostomy O/P 8518m(up).  LBM 12/31. PPI IV, PRN Zofran.  Po Intake 680 clears>102072mast 24hr, +Resource/Boost this AM  Endo: No hx DM - CBGs well controlled Insulin requirements in the past 24 hours: 2 units SSI  Lytes: Na/CL down 133/104 with D5W, others WNL. Ca 8.1 adjusts to 10.02  Renal: CRRT 12/5 >> 12/9.  SCr 1.87 down slightly (was 0.99), UOP 0.7/24 hrs. I/O +16.8L total.  Pulm: extubated 12/14, re-intubated 12/20 during code and extubated again, stable on RA  Cards: s/p code blue 12/20, cath 12/25 - BP elevated, initially brady and now intermittent tachy (TV pacer) - IV hydralazine q 4 hrs. Sinus brady  Hepatobil: liver necrosis - AST/ALT 486/375, alk phos elevated at 152, tbili down to 1, TG elevated but down to 227>653?? Today May have been drawn from the line (lipid providing 20% of non-protein kCal).   Neuro:  PRN morphine ID: Zosyn for E.coli Subdiaphragmatic and subhepatic abscess and bacteremia - afebrile, WBC 11.6 down TPN Access: PICC triple lumen placed 08/04/17 TPN start date: 07/26/17  Nutritional  Goals (per RD rec on 12/27): 2200-2400 kCal and 118-141gm protein per day   Current Nutrition:  TPN Clear liquid diet.  Hold off on advancing diet to evaluate drain output.   Plan:  Paged Dr. WilRosetta Posner Wean TPN to 45 ml/hr and remove fate emulsion tonight. Recheck triglycerides 1/2. Hopefully can d/c TPN then. Electrolytes in TPN: Normalize Na,  Low dose Phos, Mg, Ca. Cl:Ac 1:1.  Add MVI and trace elements in TPN Reduce SSI to sensitive Q8H Change IVF to 1/2 NS at 72m61m Triglycerides in AM   Caleb Ehle S. RobeAlford HighlandarmD, BCPSWilson Medical Centernical Staff Pharmacist Pager [phone], 11:37 AM

## 2017-08-21 LAB — CBC WITH DIFFERENTIAL/PLATELET
BAND NEUTROPHILS: 0 %
BASOS ABS: 0.3 10*3/uL — AB (ref 0.0–0.1)
Basophils Relative: 2 %
Blasts: 0 %
EOS ABS: 0 10*3/uL (ref 0.0–0.7)
EOS PCT: 0 %
HCT: 26.6 % — ABNORMAL LOW (ref 39.0–52.0)
Hemoglobin: 8.2 g/dL — ABNORMAL LOW (ref 13.0–17.0)
LYMPHS ABS: 2.9 10*3/uL (ref 0.7–4.0)
Lymphocytes Relative: 21 %
MCH: 28.9 pg (ref 26.0–34.0)
MCHC: 30.8 g/dL (ref 30.0–36.0)
MCV: 93.7 fL (ref 78.0–100.0)
METAMYELOCYTES PCT: 0 %
Monocytes Absolute: 1 10*3/uL (ref 0.1–1.0)
Monocytes Relative: 7 %
Myelocytes: 0 %
NEUTROS ABS: 9.6 10*3/uL — AB (ref 1.7–7.7)
Neutrophils Relative %: 70 %
Other: 0 %
PLATELETS: 467 10*3/uL — AB (ref 150–400)
Promyelocytes Absolute: 0 %
RBC: 2.84 MIL/uL — ABNORMAL LOW (ref 4.22–5.81)
RDW: 16.6 % — AB (ref 11.5–15.5)
WBC: 13.8 10*3/uL — ABNORMAL HIGH (ref 4.0–10.5)
nRBC: 0 /100 WBC

## 2017-08-21 LAB — GLUCOSE, CAPILLARY
GLUCOSE-CAPILLARY: 121 mg/dL — AB (ref 65–99)
Glucose-Capillary: 100 mg/dL — ABNORMAL HIGH (ref 65–99)
Glucose-Capillary: 143 mg/dL — ABNORMAL HIGH (ref 65–99)

## 2017-08-21 LAB — HEPATIC FUNCTION PANEL
ALBUMIN: 1.7 g/dL — AB (ref 3.5–5.0)
ALK PHOS: 189 U/L — AB (ref 38–126)
ALT: 290 U/L — AB (ref 17–63)
AST: 186 U/L — ABNORMAL HIGH (ref 15–41)
BILIRUBIN INDIRECT: 0.7 mg/dL (ref 0.3–0.9)
Bilirubin, Direct: 0.5 mg/dL (ref 0.1–0.5)
TOTAL PROTEIN: 7.3 g/dL (ref 6.5–8.1)
Total Bilirubin: 1.2 mg/dL (ref 0.3–1.2)

## 2017-08-21 LAB — TRIGLYCERIDES: Triglycerides: 269 mg/dL — ABNORMAL HIGH (ref <150)

## 2017-08-21 NOTE — Progress Notes (Signed)
Charted reviewed for LOS; B Tanner Vigna RN,MA,BSN [phone] 

## 2017-08-21 NOTE — Progress Notes (Signed)
PHARMACY - ADULT TOTAL PARENTERAL NUTRITION CONSULT NOTE   Pharmacy Consult:  TPN Indication: Prolonged ileus  Patient Measurements: Height: _0  (177.8 cm) Weight: 171 lb 8.3 oz (77.8 kg) IBW/kg (Calculated) : 73 TPN AdjBW (KG): 78 Body mass index is 24.61 kg/m.  Assessment:  50 YOM admitted on 07/23/17 with severe and worsening abdominal distention, pain, emesis and lack of bowel movements x3 days. No significant PMH. He was found to have perforated small bowel s/p repair on 07/23/17 and washout, ileocecetomy, ileostomy and closure of abdominal wall on 07/25/17. Patient transferred to [facility] on 08/13/17 for transvenous pacer insertion.  GI: Ex-lap with drainage of abd abscess and evacuation of pelvic hematoma 12/12.  Wound is dehisced, repeat CT 12/27 >> IR drained 12/29 (purulent drainage).  Prealbumin 14.8>25, drain O/P 44m, ileostomy O/P 8544m(up).  LBM 12/31. PPI IV, PRN Zofran.  Po Intake, clears, 102036m200mL last 24hr, +Resource/Boost this AM  Endo: No hx DM - CBGs well controlled Insulin requirements in the past 24 hours: 2 units SSI  Lytes: Na/CL down 133/104 with D5W, others WNL. Ca 8.1 adjusts to 10.02 - last labs 1/1  Renal: CRRT 12/5 >> 12/9.  SCr 1.87 down slightly (was 0.99), UOP 1.6 L, Still 6.7 L + for admit   Pulm: extubated 12/14, re-intubated 12/20 during code and extubated again, stable on RA  Cards: s/p code blue 12/20, cath 12/25 - BP elevated, initially brady and now intermittent tachy (TV pacer) - IV hydralazine q 4 hrs. Sinus brady  Hepatobil: liver necrosis - AST/ALT 486>186/375>290, alk phos climbing from 152>189, TG normalizing - down to 269  Neuro:  PRN morphine  ID: Zosyn for E.coli Subdiaphragmatic and subhepatic abscess and bacteremia - afebrile, WBC 13.8 - repeat CT scan in next 24-48 hrs to determine continued need for abx based on fluid collections  TPN Access: PICC triple lumen placed 08/04/17 TPN start date: 07/26/17  Nutritional Goals (per  RD rec on 12/27): 2200-2400 kCal and 118-141gm protein per day   Current Nutrition:  TPN Clear liquid diet.  Hold off on advancing diet to evaluate drain output.  Plan:  After discussion with Dr ThoGrandville SilosPN to be stopped after the current bag is complete at [facility], [facility], [facility] [facility] Pharmac[facility] Clinical phone for 08/21/2017 from 7a-3:30p: [phone] after 3:30p, please call main pharmacy at: [phone] 08/21/2017 7:57 AM

## 2017-08-21 NOTE — Progress Notes (Signed)
Occupational Therapy Treatment Patient Details Name: Caleb Singh MRN: [id number] DOB: 1955/12/19 Today's Date: 08/21/2017    History of present illness Pt is a 62 yo male admitted 07/23/17 with intestinal perforation. Now s/p resection & ileostomy on 12/5 and 12/6; s/p abscess draining 12/12. VDRF. A fib with RVR. ETT 12/20 >> 12/24. 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 PMH includes ETOH abuse, PE.    OT comments  Pt making good progress toward OT goals this session. Goals due for re-assessment this session and updated as appropriate for next 2-week period. Pt able to demonstrate improving activity tolerance for ADL completing functional mobility in preparation for ADL transfers with Carley HammedEva walker and mod assist +2 this session. Pt able to complete LB dressing tasks this session with max assist utilizing backward chaining strategies this session. D/C recommendation remains appropriate. VSS throughout session.    Follow Up Recommendations  SNF    Equipment Recommendations  3 in 1 bedside commode(TBD at next venue of care)    Recommendations for Other Services      Precautions / Restrictions Precautions Precautions: Fall Precaution Comments: Multiple lines; multiple JP drains, temporary pacer Restrictions Weight Bearing Restrictions: No       Mobility Bed Mobility Overal bed mobility: Needs Assistance Bed Mobility: Supine to Sit     Supine to sit: Mod assist     General bed mobility comments: Pt able to advance LEs unassisted with bed in elevated position. Cues for hand placement and assistance to elevate trunk and scoot to edge of bed.    Transfers Overall transfer level: Needs assistance Equipment used: 4-wheeled walker(Eva walker) Transfers: Sit to/from Stand Sit to Stand: Mod assist;+2 physical assistance         General transfer comment: Assist to power up into standing position. Cues for hand placement.  Pt  given cues to push with RUE but quickly switched to reach for EVA walker with B Hands and attempt to pull into standing.  Pt remains to present with increased WOB.  Poor safety with hand placement and eccentric loading during descent back to seated surface.      Balance Overall balance assessment: Needs assistance Sitting-balance support: Bilateral upper extremity supported;Feet supported Sitting balance-Leahy Scale: Poor Sitting balance - Comments: Min guard for sitting balance   Standing balance support: Bilateral upper extremity supported;No upper extremity supported Standing balance-Leahy Scale: Poor Standing balance comment: Relies on external assist and B UE support                           ADL either performed or assessed with clinical judgement   ADL Overall ADL's : Needs assistance/impaired                     Lower Body Dressing: Maximal assistance;Bed level Lower Body Dressing Details (indicate cue type and reason): Able to finish 25% task when assisted with sock donning with backwards chaining Toilet Transfer: Moderate assistance;+2 for physical assistance;+2 for safety/equipment;RW Toilet Transfer Details (indicate cue type and reason): With Carley HammedEva walker         Functional mobility during ADLs: Moderate assistance;+2 for physical assistance;+2 for safety/equipment General ADL Comments: Pt able to attempt LB dressing tasks at bed level today with encouragement.      Vision       Perception     Praxis      Cognition Arousal/Alertness: Awake/alert Behavior During Therapy: [facility]FL  for tasks assessed/performed Overall Cognitive Status: Within Functional Limits for tasks assessed                                          Exercises     Shoulder Instructions       General Comments      Pertinent Vitals/ Pain       Pain Assessment: Faces Faces Pain Scale: Hurts little more Pain Location: abdomen with movement Pain Descriptors  / Indicators: Sore Pain Intervention(s): Monitored during session;Repositioned  Home Living                                          Prior Functioning/Environment              Frequency  Min 2X/week        Progress Toward Goals  OT Goals(current goals can now be found in the care plan section)  Progress towards OT goals: Progressing toward goals(goals and time frame updated )  Acute Rehab OT Goals Patient Stated Goal: to regain PLOF OT Goal Formulation: With patient Time For Goal Achievement: 09/04/17 Potential to Achieve Goals: Good ADL Goals Pt Will Perform Lower Body Dressing: with min assist;sit to/from stand Pt Will Transfer to Toilet: ambulating;with min assist;bedside commode Pt Will Perform Toileting - Clothing Manipulation and hygiene: sit to/from stand;with mod assist Additional ADL Goal #1: Pt will perform UB dressing tasks with min assist after set-up in seated position. Additional ADL Goal #3: Pt will complete bed mobility in preparation for ADL participation seated at EOB with overall min guard assist.  Plan Discharge plan remains appropriate    Co-evaluation    PT/OT/SLP Co-Evaluation/Treatment: Yes Reason for Co-Treatment: Complexity of the patient's impairments (multi-system involvement);Necessary to address cognition/behavior during functional activity;For patient/therapist safety;To address functional/ADL transfers PT goals addressed during session: Balance;Mobility/safety with mobility OT goals addressed during session: ADL's and self-care;Strengthening/ROM      AM-PAC PT "6 Clicks" Daily Activity     Outcome Measure   Help from another person eating meals?: A Little Help from another person taking care of personal grooming?: A Lot Help from another person toileting, which includes using toliet, bedpan, or urinal?: A Lot Help from another person bathing (including washing, rinsing, drying)?: A Lot Help from another person to  put on and taking off regular upper body clothing?: A Lot Help from another person to put on and taking off regular lower body clothing?: A Lot 6 Click Score: 13    End of Session Equipment Utilized During Treatment: Gait belt;Rolling walker(Eva walker)  OT Visit Diagnosis: Muscle weakness (generalized) (M62.81)   Activity Tolerance Patient limited by fatigue   Patient Left with call bell/phone within reach;with family/visitor present;in chair   Nurse Communication Mobility status        Time: 1610-9604 OT Time Calculation (min): 29 min  Charges: OT General Charges $OT Visit: 1 Visit OT Treatments $Self Care/Home Management : 8-22 mins  Doristine Section, MS [name]  Pager: [phone]    Shamra Bradeen A Von Inscoe 08/21/2017, 2:41 PM

## 2017-08-21 NOTE — Progress Notes (Signed)
Visited with patient and sister regarding Advanced Directives.  Issued paperwork and provided instructions to them about getting paperwork completed.  Sister will work with him in getting paperwork done.  Chaplain will return to provide notary and get paperwork complete.  Patient and sister was instructed to let someone know once it is completed however I will check back in an hour or so to see if we can get it completed.    08/21/17 1004  Clinical Encounter Type  Visited With Patient and family together  Visit Type Follow-up;Spiritual support;Critical Care  Spiritual Encounters  Spiritual Needs Literature (Advanced Directives)

## 2017-08-21 NOTE — Progress Notes (Signed)
Physical Therapy Treatment Patient Details Name: Caleb LaundryFrederick Sanda MRN: [id number] DOB: 04/21/1956 Today's Date: 08/21/2017    History of Present Illness Pt is a 62 yo male admitted 07/23/17 with intestinal perforation. Now s/p resection & ileostomy on 12/5 and 12/6; s/p abscess draining 12/12. VDRF. A fib with RVR. ETT 12/20 >> 12/24. 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 PMH includes ETOH abuse, PE.     PT Comments    Pt performed increased gait during session. HR elevated to 150 bpm and patient fatigues quickly with activity.  Pt continues to progress slowly.  Pt's plan for short term SNF placement remains appropriate to improve strength and function in regards to mobility before return home.  BP pre session 160/89 BP post session 147/89  Pt reports mild dizziness sitting edge of bed which he reports resolved before he stood.    Follow Up Recommendations  SNF     Equipment Recommendations  Other (comment)(TBD at next venue)    Recommendations for Other Services       Precautions / Restrictions Precautions Precautions: Fall Precaution Comments: Multiple lines Restrictions Weight Bearing Restrictions: No    Mobility  Bed Mobility Overal bed mobility: Needs Assistance Bed Mobility: Supine to Sit     Supine to sit: Mod assist     General bed mobility comments: Pt able to advance LEs unassisted with bed in elevated position. Cues for hand placement and assistance to elevate trunk and scoot to edge of bed.    Transfers Overall transfer level: Needs assistance Equipment used: 4-wheeled walker(EVA walker) Transfers: Sit to/from Stand Sit to Stand: Mod assist;+2 physical assistance(for boosting into standing.  )         General transfer comment: Cues for hand placement.  Pt given cues to push with RUE but quickly switched to reach for EVA walker with B Hands and attempt to pull into standing.  Pt remains to present  with increased WOB.  Poor safety with hand placement and eccentric loading during descent back to seated surface.    Ambulation/Gait Ambulation/Gait assistance: Mod assist;+2 safety/equipment Ambulation Distance (Feet): 14 Feet Assistive device: 4-wheeled walker(EVA walker) Gait Pattern/deviations: Step-to pattern;Decreased stride length;Antalgic;Wide base of support Gait velocity: Decreased Gait velocity interpretation: Below normal speed for age/gender General Gait Details: Pt with improved activity and able to increased B stride length and gait distance overall.  Pt with HR elevated to 150bpm and O2 sats on RA remained above 90%.  Increased DOE 3/4 dyspnea.  Close chair follow required.     Stairs            Wheelchair Mobility    Modified Rankin (Stroke Patients Only)       Balance     Sitting balance-Leahy Scale: Poor       Standing balance-Leahy Scale: Poor                              Cognition Arousal/Alertness: Awake/alert Behavior During Therapy: WFL for tasks assessed/performed Overall Cognitive Status: Within Functional Limits for tasks assessed                                        Exercises      General Comments        Pertinent Vitals/Pain Pain Assessment: Faces Faces Pain Scale: Hurts little more  Pain Location: abdomen with movement Pain Descriptors / Indicators: Sore Pain Intervention(s): Monitored during session;Repositioned    Home Living                      Prior Function            PT Goals (current goals can now be found in the care plan section) Acute Rehab PT Goals Patient Stated Goal: to regain PLOF Potential to Achieve Goals: Good Progress towards PT goals: Progressing toward goals    Frequency    Min 2X/week      PT Plan Current plan remains appropriate    Co-evaluation PT/OT/SLP Co-Evaluation/Treatment: Yes Reason for Co-Treatment: Complexity of the patient's  impairments (multi-system involvement);Necessary to address cognition/behavior during functional activity;To address functional/ADL transfers PT goals addressed during session: Balance;Mobility/safety with mobility OT goals addressed during session: ADL's and self-care;Strengthening/ROM      AM-PAC PT "6 Clicks" Daily Activity  Outcome Measure  Difficulty turning over in bed (including adjusting bedclothes, sheets and blankets)?: Unable Difficulty moving from lying on back to sitting on the side of the bed? : Unable Difficulty sitting down on and standing up from a chair with arms (e.g., wheelchair, bedside commode, etc,.)?: Unable Help needed moving to and from a bed to chair (including a wheelchair)?: A Lot Help needed walking in hospital room?: A Lot Help needed climbing 3-5 steps with a railing? : Total 6 Click Score: 8    End of Session Equipment Utilized During Treatment: Gait belt Activity Tolerance: Patient tolerated treatment well;Patient limited by fatigue Patient left: in chair;with call bell/phone within reach Nurse Communication: Mobility status PT Visit Diagnosis: Muscle weakness (generalized) (M62.81);Other abnormalities of gait and mobility (R26.89)     Time: 8657-8469 PT Time Calculation (min) (ACUTE ONLY): 29 min  Charges:  $Gait Training: 8-22 mins                    G Codes:       Joycelyn Rua, PTA pager [phone]    Florestine Avers 08/21/2017, 12:29 PM

## 2017-08-21 NOTE — Progress Notes (Addendum)
Advanced Directive HCPOA completed.  Copy placed on chart.  Original copy given to patient.    08/21/17 1426  Clinical Encounter Type  Visited With Patient  Advance Directives (For Healthcare)  Does Patient Have a Medical Advance Directive? Yes

## 2017-08-21 NOTE — Progress Notes (Signed)
Referring Physician(s): Dr Elwyn Lade  Supervising Physician: Simonne Come  Patient Status:  [facility] - In-pt  Chief Complaint:  Abd abscesses  Subjective:  12/29 IR procedure: Status post CT-guided drainage of fluid collection in the right lower quadrant.  Up in chair Doing so much better Drinking OJ Still feels full Pain is less OP of drain 3 (IR)--bloody   Allergies: Patient has no known allergies.  Medications: Prior to Admission medications   Medication Sig Start Date End Date Taking? Authorizing Provider  clindamycin (CLEOCIN) 300 MG capsule Take 1 capsule (300 mg total) by mouth 3 (three) times daily. Patient not taking: Reported on 07/23/2017 04/21/16   Dominica Severin, MD  oxyCODONE (OXY IR/ROXICODONE) 5 MG immediate release tablet Take 1-2 tablets (5-10 mg total) by mouth every 3 (three) hours as needed for moderate pain. Patient not taking: Reported on 07/23/2017 04/21/16   Dominica Severin, MD     Vital Signs: BP (!) 154/87   Pulse (!) 120   Temp 97.7 F (36.5 C) (Oral)   Resp (!) 36   Ht 5\' 10"  (1.778 m)   Wt 171 lb 8.3 oz (77.8 kg)   SpO2 100%   BMI 24.61 kg/m   Physical Exam  Skin: Skin is warm and dry.  Site is clean and dry NT No bleeding OP drain 3 is bloody (IR drain) 10 cc daily Cx; No growth  Nursing note and vitals reviewed.   Imaging: Dg Abd Portable 1v  Result Date: 08/20/2017 CLINICAL DATA:  Initial evaluation for abdominal pain. History of multiple abdominal surgeries. EXAM: PORTABLE ABDOMEN - 1 VIEW COMPARISON:  Prior CT from 08/15/2017. FINDINGS: Bowel gas pattern is within normal limits without evidence for obstruction or ileus. Percutaneous pigtail drains overlie the left upper and right upper quadrants. Additional draining at the subhepatic right upper quadrant. Right lower quadrant colostomy in place. No soft tissue mass or abnormal calcification. No free air on this limited supine view the abdomen. Prominent degenerative changes  noted within the lower lumbar spine about the hips bilaterally. Probable atelectatic changes noted at the lung bases. IMPRESSION: 1. Nonobstructive bowel gas pattern with no radiographic evidence for acute intra-abdominal pathology. 2. Multiple surgical drains in place as above with right lower quadrant colostomy. Electronically Signed   By: Rise Mu M.D.   On: 08/20/2017 22:26    Labs:  CBC: Recent Labs    08/16/17 0340 08/18/17 0401 08/20/17 0543 08/21/17 0348  WBC 17.4* 16.5* 11.6* 13.8*  HGB 8.4* 7.9* 7.4* 8.2*  HCT 26.5* 26.3* 24.0* 26.6*  PLT 568* 553* 465* 467*    COAGS: Recent Labs    07/23/17 1455 07/24/17 0350 07/31/17 0745 08/08/17 1329  INR 1.78 1.77 1.58 1.45    BMP: Recent Labs    08/16/17 0340 08/18/17 0401 08/19/17 0805 08/20/17 0543  NA 150* 147* 134* 133*  K 3.6 4.0 3.9 3.7  CL 119* 117* 105 104  CO2 24 26 23 23   GLUCOSE 132* 123* 384* 381*  BUN 50* 55* 51* 47*  CALCIUM 8.4* 8.2* 8.1* 8.0*  CREATININE 1.98* 2.08* 2.00* 1.87*  GFRNONAA 34* 32* 34* 37*  GFRAA 40* 37* 39* 42*    LIVER FUNCTION TESTS: Recent Labs    08/12/17 1030 08/15/17 0802 08/19/17 0805 08/21/17 0348  BILITOT 2.3* 1.2 1.0 1.2  AST 49* 375* 486* 186*  ALT 38 145* 375* 290*  ALKPHOS 126 142* 152* 189*  PROT 7.1 6.6 6.9 7.3  ALBUMIN 1.6* 1.4* 1.6*  1.7*    Assessment and Plan:  abd abscess drain #3 placed in IR 12/29 OP bloody For re imaging 1/3 per chart  Electronically Signed: [employer] A, PA-C 08/21/2017, 1:00 PM   I spent a total of 15 Minutes at the the patient's bedside AND on the patient's hospital floor or unit, greater than 50% of which was counseling/coordinating care for abd abscess drain

## 2017-08-21 NOTE — Progress Notes (Signed)
Telemetry reviewed No significant pacing since 1/1 afternoon No new recs  Gypsy BalsamAmber Seiler, NP 08/21/2017 10:03 AM  Appears to have had less pacing overnight.  No changes at this time.  Hillis RangeJames Marygrace Sandoval MD, [facility] Surgical And Endoscopy CenterFACC 08/21/2017 2:03 PM

## 2017-08-21 NOTE — Progress Notes (Signed)
PULMONARY / CRITICAL CARE MEDICINE   Name: Caleb Singh MRN: [id number] DOB: Sep 14, 1955    ADMISSION DATE:  07/23/2017 CONSULTATION DATE:  07/23/17  REFERRING MD:  Dr. Andrey Campanile, CCS  CHIEF COMPLAINT:  Abdominal Pain   BRIEF SUMMARY: 62 yo male former smoker presented with severe abdominal pain and found to have free air in abdomen from perforated SB and ischemic cecum.  S/p resection of terminal ileum.  Required re-look for drainage of abdominal abscesses and evacuation of pelvic hematoma.  SUBJECTIVE:  Pt asking for coffee.  Tolerating liquid diet but poor intake.  TPN continues.  Bradycardia events 1/1.    VITAL SIGNS: BP (!) 160/87   Pulse (!) 102   Temp 99 F (37.2 C) (Oral)   Resp (!) 23   Ht 5\' 10"  (1.778 m)   Wt 171 lb 8.3 oz (77.8 kg)   SpO2 98%   BMI 24.61 kg/m   HEMODYNAMICS:    VENTILATOR SETTINGS:    INTAKE / OUTPUT: I/O last 3 completed shifts: In: 4967.5 [P.O.:120; I.V.:4607.5; Other:40; IV Piggyback:200] Out: 3701 [Urine:2310; Drains:71; Stool:1320]  PHYSICAL EXAMINATION: General:  Ill appearing adult male in NAD, sitting up in bed eating HEENT: MM pink/moist, no jvd, temporal wasting  PSY: calm/appropriate Neuro: Awake / alert, oriented to self, place, MAE CV: s1s2 rrr, no m/r/g PULM: even/non-labored, lungs bilaterally clear  GI: abd with midline dressing c/d/i, multiple JP drains, ileostomy Extremities: warm/dry, no edema  Skin: no rashes or lesions   LABS:  BMET Recent Labs  Lab 08/18/17 0401 08/19/17 0805 08/20/17 0543  NA 147* 134* 133*  K 4.0 3.9 3.7  CL 117* 105 104  CO2 26 23 23   BUN 55* 51* 47*  CREATININE 2.08* 2.00* 1.87*  GLUCOSE 123* 384* 381*    Electrolytes Recent Labs  Lab 08/15/17 0802 08/16/17 0340 08/18/17 0401 08/19/17 0805 08/20/17 0543  CALCIUM 8.3* 8.4* 8.2* 8.1* 8.0*  MG 2.0 2.1  --  2.1 2.0  PHOS 2.9 3.4  --  3.1  --     CBC Recent Labs  Lab 08/18/17 0401 08/20/17 0543 08/21/17 0348  WBC  16.5* 11.6* 13.8*  HGB 7.9* 7.4* 8.2*  HCT 26.3* 24.0* 26.6*  PLT 553* 465* 467*    Coag's No results for input(s): APTT, INR in the last 168 hours.  Sepsis Markers No results for input(s): LATICACIDVEN, PROCALCITON, O2SATVEN in the last 168 hours.  ABG Recent Labs  Lab 08/20/17 1254  PHART 7.459*  PCO2ART 32.5  PO2ART 92.0    Liver Enzymes Recent Labs  Lab 08/15/17 0802 08/19/17 0805 08/21/17 0348  AST 375* 486* 186*  ALT 145* 375* 290*  ALKPHOS 142* 152* 189*  BILITOT 1.2 1.0 1.2  ALBUMIN 1.4* 1.6* 1.7*    Cardiac Enzymes No results for input(s): TROPONINI, PROBNP in the last 168 hours.  Glucose Recent Labs  Lab 08/19/17 1151 08/20/17 0027 08/20/17 0752 08/20/17 1644 08/20/17 2344 08/21/17 0743  GLUCAP 138* 107* 129* 123* 121* 100*    Imaging Dg Abd Portable 1v  Result Date: 08/20/2017 CLINICAL DATA:  Initial evaluation for abdominal pain. History of multiple abdominal surgeries. EXAM: PORTABLE ABDOMEN - 1 VIEW COMPARISON:  Prior CT from 08/15/2017. FINDINGS: Bowel gas pattern is within normal limits without evidence for obstruction or ileus. Percutaneous pigtail drains overlie the left upper and right upper quadrants. Additional draining at the subhepatic right upper quadrant. Right lower quadrant colostomy in place. No soft tissue mass or abnormal calcification. No free air on  this limited supine view the abdomen. Prominent degenerative changes noted within the lower lumbar spine about the hips bilaterally. Probable atelectatic changes noted at the lung bases. IMPRESSION: 1. Nonobstructive bowel gas pattern with no radiographic evidence for acute intra-abdominal pathology. 2. Multiple surgical drains in place as above with right lower quadrant colostomy. Electronically Signed   By: Rise Mu M.D.   On: 08/20/2017 22:26     STUDIES:  CT abd/pelvis 12/04 >> free air, SB pneumatosis, b/l inguinal hernias, b/l renal cysts Echo 12/05 >> EF 45 to 50%,  mild LVH, grade 1 DD CT abd/pelvis 12/12 >> extraluminal contrast, ascites, abscess Lt hepatic lobe area, RLL consolidation, b/l effusions CT chest/abd/pelvis 12/19 >> pelvic hematoma liquefaction, perisplenic abscess, atelectasis LHC 12/25 > normal coronaries, TV pacer placed with backup rate of 40bpm. CT A/P 12/27 > multiple complicated fluid collections parasplenic and perihepatic fluid collections stable/decreased though now with multiple large abdominal/pelvic collections  CULTURES: Blood 12/04: E coli Blood 12/10: negative Surgical wound 12/12: Pan sensitive E. Coli Surgical drain 12/20: E coli Abdominal Abscess 12/29 >>   ANTIBIOTICS: Vancomycin 12/04 >> 12/04 Zosyn 12/04 >> 12/13 Anidulafungin 12/04 >> 12/17 Meropenem 12/14 >> 12/17 Zosyn 12/17 >> Anidulafungin 12/19 >> 12/24 Vanco 12/19 >>12/24  SIGNIFICANT EVENTS: 12/04 Admit 12/05 Start CRRT 12/06 Ileostomy, closure of abdominal wound 12/08 Off pressors 12/09 Off CRRT 12/12 Laparotomy for abscess, evacuation of hematoma  12/16 Off precedex  12/18 Bicarb added for NG acidosis, tx 1 unit PRBC 12/19 Decompensated, PEA arrest > intubated 12/20 IR drainage of LUQ abscess 12/25 Transferred to [facility] for LHC and temp venous pacer placement 12/27 PPM set to VVI rate 30 12/29 Drain to RLQ by IR 12/31 Episodic brady events with pacing capture at 30, last 24h. Have been associated with some nausea.  Tolerating clears.   LINES/TUBES: Lt Velma CVL 12/04 >> 12/16 ETT 12/04 >> 12/04 RUE PICC 12/16 >> ETT 12/20 >> 12/24 R IJ temp venous pacer 12/26 >   CONSULTS 12/05 Renal, s/o 12/12 12/19 Wound care 12/20 IR  12/25 Cardiology 12/26 Palliative   DISCUSSION: 62 yo male with SB perforation, cecal ischemia complicated by sepsis with peritonitis, E coli bacteremia, multiple abdominal abscess with E coli, and acute renal failure.  Not candidate for further surgical intervention.  Transferred from [facility] to [facility] due to  bradycardia and pauses requiring transvenous pacer.  Felt to be related to vagal response.  ASSESSMENT / PLAN:  PULMONARY A: P:    CARDIOVASCULAR A:  Chest Pain with Bradycardia - s/p LHC and temp pacer placement 12/25. A fib with RVR. Hypertension. P:  Tele monitoring  Cardiology following, appreciate input  DNR in event of arrest  Hydralazine 20 mg IV Q4 Labetalol PRN   RENAL A:   CKD III P:   Trend BMP / urinary output Replace electrolytes as indicated Avoid nephrotoxic agents, ensure adequate renal perfusion D5w @ 50 ml/hr   GASTROINTESTINAL A:   Peritonitis secondary to Small Bowel Perforation & Cecal Ischemia  Severe Protein Calorie Malnutrition  P:   Recommendations per CCS  Full liquid diet per CCS  Zosyn as above Continue TPN Wound care / drains per surgery  > plan for re-imaging of abd 1/3   HEMATOLOGIC A:   Anemia of Critical Illness  P:  Trend CBC Heparin SQ for DVT prophylaxis   INFECTIOUS A:   Peritonitis with E-Coli (pansensitive) Bacteremia  P:   Await 12/29 abscess cultures  Continue zosyn  Monitor fever  curve / WBC trend   ENDOCRINE A:   Hyperglycemia    P:   SSI   NEUROLOGIC A:   Anxiety Pain  P:   RASS goal: n/a  Ativan 0.5mg  IV Q12 PRN PRN morphine for pain   FAMILY  - Updates:  Patient updated at bedside 1/2.  No family available am 1/2 (sister).   Canary BrimBrandi Burnice Oestreicher, NP-C [facility] Pgr: [phone] or if no answer [phone] 08/21/2017, 9:30 AM

## 2017-08-21 NOTE — Progress Notes (Signed)
8 Days Post-Op   Subjective/Chief Complaint: No complaint   Objective: Vital signs in last 24 hours: Temp:  [97.6 F (36.4 C)-99 F (37.2 C)] 99 F (37.2 C) (01/02 0739) Pulse Rate:  [28-114] 102 (01/02 0700) Resp:  [23-40] 23 (01/02 0700) BP: (138-177)/(62-95) 160/87 (01/02 0700) SpO2:  [95 %-100 %] 98 % (01/02 0700) Weight:  [77.8 kg (171 lb 8.3 oz)] 77.8 kg (171 lb 8.3 oz) (01/02 0500) Last BM Date: 08/20/17  Intake/Output from previous day: 01/01 0701 - 01/02 0700 In: 2788.5 [I.V.:2618.5; IV Piggyback:150] Out: 2771 [Urine:1660; Drains:41; Stool:1070] Intake/Output this shift: No intake/output data recorded.  General appearance: cooperative Resp: clear to auscultation bilaterally Cardio: regular rate and rhythm and 108 GI: wound with fascial separation but abdomen frozen in, some slough, ostomy with appliance  Lab Results:  Recent Labs    08/20/17 0543 08/21/17 0348  WBC 11.6* 13.8*  HGB 7.4* 8.2*  HCT 24.0* 26.6*  PLT 465* 467*   BMET Recent Labs    08/19/17 0805 08/20/17 0543  NA 134* 133*  K 3.9 3.7  CL 105 104  CO2 23 23  GLUCOSE 384* 381*  BUN 51* 47*  CREATININE 2.00* 1.87*  CALCIUM 8.1* 8.0*   PT/INR No results for input(s): LABPROT, INR in the last 72 hours. ABG Recent Labs    08/20/17 1254  PHART 7.459*  HCO3 23.1    Studies/Results: Dg Abd Portable 1v  Result Date: 08/20/2017 CLINICAL DATA:  Initial evaluation for abdominal pain. History of multiple abdominal surgeries. EXAM: PORTABLE ABDOMEN - 1 VIEW COMPARISON:  Prior CT from 08/15/2017. FINDINGS: Bowel gas pattern is within normal limits without evidence for obstruction or ileus. Percutaneous pigtail drains overlie the left upper and right upper quadrants. Additional draining at the subhepatic right upper quadrant. Right lower quadrant colostomy in place. No soft tissue mass or abnormal calcification. No free air on this limited supine view the abdomen. Prominent degenerative changes  noted within the lower lumbar spine about the hips bilaterally. Probable atelectatic changes noted at the lung bases. IMPRESSION: 1. Nonobstructive bowel gas pattern with no radiographic evidence for acute intra-abdominal pathology. 2. Multiple surgical drains in place as above with right lower quadrant colostomy. Electronically Signed   By: Rise Mu M.D.   On: 08/20/2017 22:26    Anti-infectives: Anti-infectives (From admission, onward)   Start     Dose/Rate Route Frequency Ordered Stop   08/10/17 0500  vancomycin (VANCOCIN) 1,500 mg in sodium chloride 0.9 % 500 mL IVPB  Status:  Discontinued     1,500 mg 250 mL/hr over 120 Minutes Intravenous Every 48 hours 08/08/17 0728 08/12/17 1018   08/08/17 2200  anidulafungin (ERAXIS) 100 mg in sodium chloride 0.9 % 100 mL IVPB  Status:  Discontinued     100 mg 78 mL/hr over 100 Minutes Intravenous Every 24 hours 08/08/17 0524 08/12/17 1018   08/08/17 0445  vancomycin (VANCOCIN) 1,500 mg in sodium chloride 0.9 % 500 mL IVPB     1,500 mg 250 mL/hr over 120 Minutes Intravenous  Once 08/08/17 0431 08/08/17 0659   08/08/17 0430  vancomycin (VANCOCIN) 1,250 mg in sodium chloride 0.9 % 250 mL IVPB  Status:  Discontinued     1,250 mg 166.7 mL/hr over 90 Minutes Intravenous  Once 08/08/17 0418 08/08/17 0428   08/08/17 0415  anidulafungin (ERAXIS) 200 mg in sodium chloride 0.9 % 200 mL IVPB     200 mg 78 mL/hr over 200 Minutes Intravenous  Once 08/08/17  0413 08/08/17 0816   08/05/17 1000  piperacillin-tazobactam (ZOSYN) IVPB 3.375 g     3.375 g 12.5 mL/hr over 240 Minutes Intravenous Every 8 hours 08/05/17 0946     08/02/17 1400  meropenem (MERREM) 1 g in sodium chloride 0.9 % 100 mL IVPB  Status:  Discontinued     1 g 200 mL/hr over 30 Minutes Intravenous Every 12 hours 08/02/17 1111 08/05/17 0945   07/29/17 0600  piperacillin-tazobactam (ZOSYN) IVPB 3.375 g  Status:  Discontinued     3.375 g 12.5 mL/hr over 240 Minutes Intravenous Every 8  hours 07/29/17 0518 08/02/17 1036   07/25/17 2000  vancomycin (VANCOCIN) IVPB 1000 mg/200 mL premix  Status:  Discontinued     1,000 mg 200 mL/hr over 60 Minutes Intravenous Every 48 hours 07/23/17 2042 07/24/17 0956   07/24/17 2200  piperacillin-tazobactam (ZOSYN) 3.375 g in dextrose 5 % 50 mL IVPB  Status:  Discontinued     3.375 g 100 mL/hr over 30 Minutes Intravenous Every 6 hours 07/24/17 2006 07/29/17 0518   07/24/17 2100  anidulafungin (ERAXIS) 100 mg in sodium chloride 0.9 % 100 mL IVPB  Status:  Discontinued     100 mg 78 mL/hr over 100 Minutes Intravenous Every 24 hours 07/23/17 2039 08/05/17 0945   07/24/17 0000  anidulafungin (ERAXIS) 100 mg in sodium chloride 0.9 % 100 mL IVPB  Status:  Discontinued     100 mg 78 mL/hr over 100 Minutes Intravenous Every 24 hours 07/23/17 2038 07/23/17 2039   07/23/17 2200  piperacillin-tazobactam (ZOSYN) IVPB 3.375 g  Status:  Discontinued     3.375 g 12.5 mL/hr over 240 Minutes Intravenous Every 8 hours 07/23/17 2043 07/24/17 2006   07/23/17 2100  anidulafungin (ERAXIS) 200 mg in sodium chloride 0.9 % 200 mL IVPB     200 mg 78 mL/hr over 200 Minutes Intravenous  Once 07/23/17 2038 07/24/17 0239   07/23/17 2100  vancomycin (VANCOCIN) 1,500 mg in sodium chloride 0.9 % 500 mL IVPB     1,500 mg 250 mL/hr over 120 Minutes Intravenous  Once 07/23/17 2041 07/23/17 2317   07/23/17 1515  piperacillin-tazobactam (ZOSYN) IVPB 3.375 g     3.375 g 100 mL/hr over 30 Minutes Intravenous  Once 07/23/17 1511 07/23/17 1611      Assessment/Plan: Perforated distal small boweland ischemia of cecum 1.S/Pexploratory laparotomy, small bowel resection without anastomosis, placement of open wound VAC system 07/23/17 Dr. Gaynelle AduEric Wilson 2. S/p Reexploration of abdomen, ileocecectomy, creation of end ileostomy,closure of abdominal wall, 07/25/17, Dr. Almond LintFaera Byerly 3. Exploratory laparotomy, drainage of abdominal abscess and evacuation of pelvic hematoma, RUQ  drain, and left lateral drain placement, 07/31/17, Dr. Glenna FellowsBenjamin Hoxworth (Findings:  Subdiaphragmatic and subhepatic abscesses.Large organizing pelvic hematoma. Apparent necrotic tissue left lobe of the liver.) Sepsis/shock -resolved, wbc decreasing Bacteremia - Enterobacteriaceae + E Coli, on abx per ccm, awaiting additional drain cultures Cardiac arrest - Code Blue 08/08/17 - reintubated/pressors/increase of antibiotics Extubated 12/24, off pressors  Acute respiratory failure -Extubated, pulmonary status improving Acute renal failure-CRRT discontinued 07/27/17 - creatinine continues to decrease Malnutrition/Deconditioning - Prealbumin <5last time checked, on tpn Anemia - stable  GI-needs continued dressing changes, wound isdehiscedbut not much more to do is certainly at risk, continue drains; IRdrained collection posterior to anastomosis - small volume bilious fluid; continue IV abx Drainage does not appear to be bilious - on diet ZO:[id number]:Vancomycin 07/23/17- 07/24/17,restarted 12/20 day 1,Zosyn 07/23/17- 12/14,anidulafungin 07/23/17 - 12/17,restarted 12/20 day 1,Meropenem 12/14 - 12/17 =>>Zosyn restarted  12/17 =>>anidulafunginrestarted 12/20 =>> Vancomycin restarted 12/20 =>>  ZOX:WRUE - Heparin Follow-up:Dr. Gaynelle Adu   Disposition: drain output has been low. Afebrile. Will plan CT A/P with oral contrast only on 1/3  LOS: 29 days    Liz Malady 08/21/2017

## 2017-08-21 NOTE — Progress Notes (Addendum)
0700 Bedside shift report, pt sleeping, easy to arouse, denies pain, SOB, no complaints, VSS, fall precautions in place, WCTM.   0730 Pt assessed, see flowsheet, pt alert & oriented x4, sometimes gets confused and asks questions about his POC or what day it is. Pt spilled urinal, linen changed and pericare performed. [facility]   0830 Pt assisted with breakfast, ate ice cream and OJ. Decrease appetite noted. MD here to see pt, no new orders.   0930 [facility], sister at bedside, updated with POC.   1045 Pt sleeping comfortably, NAD, VSS.   1135 PT here to work with pt. Pt up walking in room, linen changed, pt now sitting in recliner. Denies SOB, abd pain, no complaints. Chair alarm on, fall precautions in place, WCTM.    1300 IR here to see pt and assess drains, no new orders. Pt eating lunch up in chair, NAD, VSS, WCTM.   1445 Pt assisted back to bed, CHG bath given, linen changed, wounds and drains to abdomen x4 changed. Pt tolerated well. Pt stated, "i'm so tired." Pt sleeping comfortably. WCTM.   1700 Pt resting in bed comfortably, NAD, VSS, WCTM.   [id number] RN called to room, pt coughing and vomited small amount of bile.

## 2017-08-22 ENCOUNTER — Inpatient Hospital Stay (HOSPITAL_COMMUNITY): Payer: Non-veteran care

## 2017-08-22 LAB — AEROBIC/ANAEROBIC CULTURE W GRAM STAIN (SURGICAL/DEEP WOUND): Culture: NO GROWTH

## 2017-08-22 LAB — CBC
HEMATOCRIT: 24.1 % — AB (ref 39.0–52.0)
HEMOGLOBIN: 7.1 g/dL — AB (ref 13.0–17.0)
MCH: 27.2 pg (ref 26.0–34.0)
MCHC: 29.5 g/dL — AB (ref 30.0–36.0)
MCV: 92.3 fL (ref 78.0–100.0)
Platelets: 441 10*3/uL — ABNORMAL HIGH (ref 150–400)
RBC: 2.61 MIL/uL — ABNORMAL LOW (ref 4.22–5.81)
RDW: 16.8 % — ABNORMAL HIGH (ref 11.5–15.5)
WBC: 14.9 10*3/uL — ABNORMAL HIGH (ref 4.0–10.5)

## 2017-08-22 LAB — COMPREHENSIVE METABOLIC PANEL WITH GFR
ALT: 231 U/L — ABNORMAL HIGH (ref 17–63)
AST: 112 U/L — ABNORMAL HIGH (ref 15–41)
Albumin: 1.7 g/dL — ABNORMAL LOW (ref 3.5–5.0)
Alkaline Phosphatase: 207 U/L — ABNORMAL HIGH (ref 38–126)
Anion gap: 9 (ref 5–15)
BUN: 38 mg/dL — ABNORMAL HIGH (ref 6–20)
CO2: 22 mmol/L (ref 22–32)
Calcium: 7.7 mg/dL — ABNORMAL LOW (ref 8.9–10.3)
Chloride: 101 mmol/L (ref 101–111)
Creatinine, Ser: 1.97 mg/dL — ABNORMAL HIGH (ref 0.61–1.24)
GFR calc Af Amer: 40 mL/min — ABNORMAL LOW (ref >60)
GFR calc non Af Amer: 34 mL/min — ABNORMAL LOW (ref >60)
Glucose, Bld: 295 mg/dL — ABNORMAL HIGH (ref 65–99)
Potassium: 3.7 mmol/L (ref 3.5–5.1)
Sodium: 132 mmol/L — ABNORMAL LOW (ref 135–145)
Total Bilirubin: 1.3 mg/dL — ABNORMAL HIGH (ref 0.3–1.2)
Total Protein: 7.1 g/dL (ref 6.5–8.1)

## 2017-08-22 LAB — GLUCOSE, CAPILLARY
GLUCOSE-CAPILLARY: 97 mg/dL (ref 65–99)
GLUCOSE-CAPILLARY: 101 mg/dL — AB (ref 65–99)
GLUCOSE-CAPILLARY: 98 mg/dL (ref 65–99)
Glucose-Capillary: 103 mg/dL — ABNORMAL HIGH (ref 65–99)

## 2017-08-22 LAB — PHOSPHORUS: Phosphorus: 3.7 mg/dL (ref 2.5–4.6)

## 2017-08-22 LAB — AEROBIC/ANAEROBIC CULTURE (SURGICAL/DEEP WOUND)

## 2017-08-22 LAB — MAGNESIUM: Magnesium: 1.9 mg/dL (ref 1.7–2.4)

## 2017-08-22 MED ORDER — ENSURE ENLIVE PO LIQD
237.0000 mL | Freq: Three times a day (TID) | ORAL | Status: DC
  Administered 2017-08-22 – 2017-08-31 (×21): 237 mL via ORAL

## 2017-08-22 MED ORDER — IOPAMIDOL (ISOVUE-300) INJECTION 61%
INTRAVENOUS | Status: AC
  Administered 2017-08-22: 15 mL via ORAL
  Filled 2017-08-22: qty 30

## 2017-08-22 NOTE — Progress Notes (Signed)
9 Days Post-Op   Subjective/Chief Complaint: Feeling better, took some PO yesterday   Objective: Vital signs in last 24 hours: Temp:  [97.7 F (36.5 C)-99 F (37.2 C)] 98.3 F (36.8 C) (01/02 2301) Pulse Rate:  [69-124] 69 (01/03 0600) Resp:  [27-36] 31 (01/03 0600) BP: (128-170)/(66-114) 152/77 (01/03 0600) SpO2:  [96 %-100 %] 98 % (01/03 0600) Weight:  [76.6 kg (168 lb 12.8 oz)] 76.6 kg (168 lb 12.8 oz) (01/03 0400) Last BM Date: 08/21/17(ILEOSTOMY FUNCTIONING)  Intake/Output from previous day: 01/02 0701 - 01/03 0700 In: 2345 [P.O.:530; I.V.:1665; IV Piggyback:150] Out: 2887 [Urine:1775; Drains:87; Stool:1025] Intake/Output this shift: No intake/output data recorded.  General appearance: cooperative Resp: clear to auscultation bilaterally Cardio: regular rate and rhythm GI: soft, drains in place, wound with stable fascial separation  Lab Results:  Recent Labs    08/21/17 0348 08/22/17 0400  WBC 13.8* 14.9*  HGB 8.2* 7.1*  HCT 26.6* 24.1*  PLT 467* 441*   BMET Recent Labs    08/20/17 0543 08/22/17 0400  NA 133* 132*  K 3.7 3.7  CL 104 101  CO2 23 22  GLUCOSE 381* 295*  BUN 47* 38*  CREATININE 1.87* 1.97*  CALCIUM 8.0* 7.7*   PT/INR No results for input(s): LABPROT, INR in the last 72 hours. ABG Recent Labs    08/20/17 1254  PHART 7.459*  HCO3 23.1    Studies/Results: Dg Abd Portable 1v  Result Date: 08/20/2017 CLINICAL DATA:  Initial evaluation for abdominal pain. History of multiple abdominal surgeries. EXAM: PORTABLE ABDOMEN - 1 VIEW COMPARISON:  Prior CT from 08/15/2017. FINDINGS: Bowel gas pattern is within normal limits without evidence for obstruction or ileus. Percutaneous pigtail drains overlie the left upper and right upper quadrants. Additional draining at the subhepatic right upper quadrant. Right lower quadrant colostomy in place. No soft tissue mass or abnormal calcification. No free air on this limited supine view the abdomen.  Prominent degenerative changes noted within the lower lumbar spine about the hips bilaterally. Probable atelectatic changes noted at the lung bases. IMPRESSION: 1. Nonobstructive bowel gas pattern with no radiographic evidence for acute intra-abdominal pathology. 2. Multiple surgical drains in place as above with right lower quadrant colostomy. Electronically Signed   By: Rise MuBenjamin  McClintock M.D.   On: 08/20/2017 22:26    Anti-infectives: Anti-infectives (From admission, onward)   Start     Dose/Rate Route Frequency Ordered Stop   08/10/17 0500  vancomycin (VANCOCIN) 1,500 mg in sodium chloride 0.9 % 500 mL IVPB  Status:  Discontinued     1,500 mg 250 mL/hr over 120 Minutes Intravenous Every 48 hours 08/08/17 0728 08/12/17 1018   08/08/17 2200  anidulafungin (ERAXIS) 100 mg in sodium chloride 0.9 % 100 mL IVPB  Status:  Discontinued     100 mg 78 mL/hr over 100 Minutes Intravenous Every 24 hours 08/08/17 0524 08/12/17 1018   08/08/17 0445  vancomycin (VANCOCIN) 1,500 mg in sodium chloride 0.9 % 500 mL IVPB     1,500 mg 250 mL/hr over 120 Minutes Intravenous  Once 08/08/17 0431 08/08/17 0659   08/08/17 0430  vancomycin (VANCOCIN) 1,250 mg in sodium chloride 0.9 % 250 mL IVPB  Status:  Discontinued     1,250 mg 166.7 mL/hr over 90 Minutes Intravenous  Once 08/08/17 0418 08/08/17 0428   08/08/17 0415  anidulafungin (ERAXIS) 200 mg in sodium chloride 0.9 % 200 mL IVPB     200 mg 78 mL/hr over 200 Minutes Intravenous  Once 08/08/17  0413 08/08/17 0816   08/05/17 1000  piperacillin-tazobactam (ZOSYN) IVPB 3.375 g     3.375 g 12.5 mL/hr over 240 Minutes Intravenous Every 8 hours 08/05/17 0946     08/02/17 1400  meropenem (MERREM) 1 g in sodium chloride 0.9 % 100 mL IVPB  Status:  Discontinued     1 g 200 mL/hr over 30 Minutes Intravenous Every 12 hours 08/02/17 1111 08/05/17 0945   07/29/17 0600  piperacillin-tazobactam (ZOSYN) IVPB 3.375 g  Status:  Discontinued     3.375 g 12.5 mL/hr over 240  Minutes Intravenous Every 8 hours 07/29/17 0518 08/02/17 1036   07/25/17 2000  vancomycin (VANCOCIN) IVPB 1000 mg/200 mL premix  Status:  Discontinued     1,000 mg 200 mL/hr over 60 Minutes Intravenous Every 48 hours 07/23/17 2042 07/24/17 0956   07/24/17 2200  piperacillin-tazobactam (ZOSYN) 3.375 g in dextrose 5 % 50 mL IVPB  Status:  Discontinued     3.375 g 100 mL/hr over 30 Minutes Intravenous Every 6 hours 07/24/17 2006 07/29/17 0518   07/24/17 2100  anidulafungin (ERAXIS) 100 mg in sodium chloride 0.9 % 100 mL IVPB  Status:  Discontinued     100 mg 78 mL/hr over 100 Minutes Intravenous Every 24 hours 07/23/17 2039 08/05/17 0945   07/24/17 0000  anidulafungin (ERAXIS) 100 mg in sodium chloride 0.9 % 100 mL IVPB  Status:  Discontinued     100 mg 78 mL/hr over 100 Minutes Intravenous Every 24 hours 07/23/17 2038 07/23/17 2039   07/23/17 2200  piperacillin-tazobactam (ZOSYN) IVPB 3.375 g  Status:  Discontinued     3.375 g 12.5 mL/hr over 240 Minutes Intravenous Every 8 hours 07/23/17 2043 07/24/17 2006   07/23/17 2100  anidulafungin (ERAXIS) 200 mg in sodium chloride 0.9 % 200 mL IVPB     200 mg 78 mL/hr over 200 Minutes Intravenous  Once 07/23/17 2038 07/24/17 0239   07/23/17 2100  vancomycin (VANCOCIN) 1,500 mg in sodium chloride 0.9 % 500 mL IVPB     1,500 mg 250 mL/hr over 120 Minutes Intravenous  Once 07/23/17 2041 07/23/17 2317   07/23/17 1515  piperacillin-tazobactam (ZOSYN) IVPB 3.375 g     3.375 g 100 mL/hr over 30 Minutes Intravenous  Once 07/23/17 1511 07/23/17 1611      Assessment/Plan: Perforated distal small boweland ischemia of cecum 1.S/Pexploratory laparotomy, small bowel resection without anastomosis, placement of open wound VAC system 07/23/17 Dr. Gaynelle Adu 2. S/p Reexploration of abdomen, ileocecectomy, creation of end ileostomy,closure of abdominal wall, 07/25/17, Dr. Almond Lint 3. Exploratory laparotomy, drainage of abdominal abscess and evacuation  of pelvic hematoma, RUQ drain, and left lateral drain placement, 07/31/17, Dr. Glenna Fellows (Findings:  Subdiaphragmatic and subhepatic abscesses.Large organizing pelvic hematoma. Apparent necrotic tissue left lobe of the liver.) Sepsis/shock -resolved, wbc decreasing Bacteremia - Enterobacteriaceae + E Coli, on abx per ccm, awaiting additional drain cultures Cardiac arrest - Code Blue 08/08/17 - reintubated/pressors/increase of antibiotics Extubated 12/24, off pressors  Acute respiratory failure -Extubated, pulmonary status improving Acute renal failure-CRRT discontinued 07/27/17 - creatinine continues to decrease Malnutrition/Deconditioning - TNA was stopped, may need to resume if PO intake not adequate Anemia - stable  GI-needs continued dressing changes, wound isdehiscedbut not much more to do is certainly at risk, continue drains; IRdrained collection posterior to anastomosis - small volume bilious fluid; continue IV abx Drainage does not appear to be bilious - on diet ZO:[id number] 07/23/17- 07/24/17,restarted 12/20 day 1,Zosyn 07/23/17- 12/14,anidulafungin 07/23/17 - 12/17,restarted 12/20 day  1,Meropenem 12/14 - 12/17 =>>Zosyn restarted 12/17 =>>anidulafunginrestarted 12/20 =>> Vancomycin restarted 12/20 =>>  WUJ:[id number] - Heparin Follow-up:Dr. Gaynelle Adu   Disposition: drain output has been low. Afebrile. Will plan CT A/P with oral contrast only (CRT 1.9) today. Appreciate IR F/U, may need ot resume TNA if PO intake not adequate  LOS: 30 days    Liz Malady 08/22/2017

## 2017-08-22 NOTE — Progress Notes (Addendum)
0700 Bedside shift report, pt sleeping, easy to arouse, NAD, no complaints, VSS, will continue to monitor.   0745 Pt assessed, see flowsheet. Pt's dressing to abd is CDI, right 3 JP drains with little outpt and 1 left drain with little outpt. Ileostomy pouch intact. Pacer to right IJ intact and verified on. Fall precautions in place, will continue to monitor.   0850 NT giving pt bath, PT here to work with pt. RN medicated per MAR. PT got pt up into chair. Pt very SOB on exertion.   1000 Wound care RN here to change ileostomy pouch. She stated pouch was leaking all over pt. Pt given bath and assisted back to bed. Pt drinking contrast for CT abd/pelvis today.   1045 RN with pt to CT  1115 Pt back to room from CT. Pt repositioned and pt falling asleep. Sister at bedside and updated with POC.   1230 Pt assisted with lunch. Pt afraid his "bag" is going to overflow and leak again. RN called wound care RN and new pouch ordered to drain to foley bag.   1400 [facility] c[facility]o headache[facility] visiting with sister.   1545 Pt resting comfortably, NAD, VSS, WCTM.   1730 Pt sleeping, NAD, easy to arouse. No complaints at this time. WCTM.   1830 Dinner tray arrived and RN attempted to set up tray but pt stated he didn't want anything right now. Tray left at bedside incase pt wants to eat in a little bit. Fall precautions in place, awaiting to give shift report to oncoming RN.   1900 RN updated night shift Charity fundraiserN. Pt resting comfortably.

## 2017-08-22 NOTE — Progress Notes (Signed)
Nutrition Follow-up  INTERVENTION:   Ensure Enlive po TID, each supplement provides 350 kcal and 20 grams of protein  NUTRITION DIAGNOSIS:   Inadequate oral intake related to poor appetite as evidenced by meal completion < 50%. Ongoing.   GOAL:   Patient will meet greater than or equal to 90% of their needs Progressing.   MONITOR:   PO intake, Supplement acceptance, I & O's  ASSESSMENT:   62 y.o. male with no significant past medical history complaining of severely worsening abdominal distention, pain, nonbloody, nonbilious, non-coffee-ground emesis and lack of bowel movements over the course of the last 3 days no BM in 48 hours, feels like he needs to deficate.  He denies prior abdominal surgeries, no fever or chills.  Patient is unsure if he is passing flatus.  He is never had any liver issues he does drink regularly but has never had any seizures or hallucinations when he does not drink, he has not had any alcohol in approximately 1 week.  He is never seen a gastroenterologist.   12/05 CRRT initiated 12/06 Ileostomy, closure of abdominal wound 12/09 CRRT stopped 12/12 Lap for abscess, evacuation of hematoma 12/19 Code Blue, cardiac arrest, Intubated 12/20 IR drainage of LUQ abscess 12/24 Extubated, Palliative Care Consulted 12/25 [facility] and [facility] IJ temp venous pacer placement 12/31 diet advanced to full liquids 1/2 TPN stopped  Pt report no appetite but willing to try Ensure. Pt appears very weak, voice a whisper. States he got no sleep last night.    Labs and meds reviewed  Diet Order:  Diet full liquid Room service appropriate? Yes; Fluid consistency: Thin  EDUCATION NEEDS:   No education needs have been identified at this time  Skin:  Skin Assessment: Skin Integrity Issues: Skin Integrity Issues:: Incisions, Stage II Stage II: scrotum Incisions: abdominal/surgical  Last BM:  1/2 1250 ml via ileostomy  Height:   Ht Readings from Last 1  Encounters:  08/19/17 5\' 10"  (1.778 m)    Weight:   Wt Readings from Last 1 Encounters:  08/22/17 168 lb 12.8 oz (76.6 kg)    Ideal Body Weight:  75.45 kg  BMI:  Body mass index is 24.22 kg/m.  Estimated Nutritional Needs:   Kcal:  2200-2400 kcals  Protein:  118-141 g  Fluid:  >/= 2.5 L  Kendell BaneHeather Irean Kendricks RD, LDN, CNSC [phone] Pager [phone] After Hours Pager

## 2017-08-22 NOTE — Consult Note (Signed)
WOC Nurse ostomy follow up:  Stoma type/location:RLQ ileostomy Stomal assessment/size:Oval, 1 1/2" wide by 1 1/4"  wide. Mucocutaneous separation from 6-12 o'clock is less but still present. Peristomal assessment:cleansed, dried and applied skin barrier ring, and applied pouching system on top of ring using a one piece soft convex pouch.  Output: 500cc of liquid green/brown bilious liquid stool Education: provided: pt is just sitting in stool that has leaked from his pouch. Pouch had 500cc, too heavy and was pulling away from skin. Educated patient on his ability to empty pouch or at least ask for nursing staff to empty. Pt well enough to start taking some initiative in learning the process of ostomy care. Graduated cylinder put at bedside for pt to use if needed. Supplies for pouch change in the room for staff nurse use. Replinished barrier ring supply.  Enrolled patient in [facility] [employer] Discharge program: No WOC team will continue to follow for weekly ostomy assessments while in ICU.  Barnett HatterMelinda Julian Medina, RN-C, WTA-C, OCA Wound Treatment Associate Ostomy Care Associate      Revision History

## 2017-08-22 NOTE — Progress Notes (Signed)
PULMONARY / CRITICAL CARE MEDICINE   Name: Caleb Singh MRN: [id number] DOB: [birth year]    ADMISSION DATE:  07/23/2017 CONSULTATION DATE:  07/23/17  REFERRING MD:  Dr. Andrey Campanile, CCS  CHIEF COMPLAINT:  Abdominal Pain   BRIEF SUMMARY: 62 yo male former smoker presented with severe abdominal pain and found to have free air in abdomen from perforated SB and ischemic cecum.  S/p resection of terminal ileum.  Required re-look for drainage of abdominal abscesses and evacuation of pelvic hematoma.  SUBJECTIVE:  RN reports intermittent pacing overnight.  Pt describes early satiety with eating.  Felt as if he had fever overnight.    VITAL SIGNS: BP (!) 166/75   Pulse 99   Temp 98.3 F (36.8 C) (Oral)   Resp (!) 29   Ht 5\' 10"  (1.778 m)   Wt 168 lb 12.8 oz (76.6 kg)   SpO2 100%   BMI 24.22 kg/m   HEMODYNAMICS:    VENTILATOR SETTINGS:    INTAKE / OUTPUT: I/O last 3 completed shifts: In: 3730 [P.O.:530; I.V.:2900; IV Piggyback:300] Out: 3637 [Urine:2175; Drains:87; Stool:1375]  PHYSICAL EXAMINATION: General: Chronically ill-appearing male in no acute distress sitting up in chair HEENT: MM pink/moist, temporal wasting PSY: Calm/appropriate Neuro: AAO x4, speech clear, MAE /generalized weakness CV: s1s2 rrr tachy, no m/r/g PULM: even/non-labored, lungs bilaterally clear  GI: Midline abdominal dressing, multiple drains varying drainage from clear yellow to purulent, ileostomy  Extremities: warm/dry, trace generalized edema  Skin: no rashes or lesions  LABS:  BMET Recent Labs  Lab 08/19/17 0805 08/20/17 0543 08/22/17 0400  NA 134* 133* 132*  K 3.9 3.7 3.7  CL 105 104 101  CO2 23 23 22   BUN 51* 47* 38*  CREATININE 2.00* 1.87* 1.97*  GLUCOSE 384* 381* 295*    Electrolytes Recent Labs  Lab 08/16/17 0340  08/19/17 0805 08/20/17 0543 08/22/17 0400  CALCIUM 8.4*   < > 8.1* 8.0* 7.7*  MG 2.1  --  2.1 2.0 1.9  PHOS 3.4  --  3.1  --  3.7   < > = values in this  interval not displayed.    CBC Recent Labs  Lab 08/20/17 0543 08/21/17 0348 08/22/17 0400  WBC 11.6* 13.8* 14.9*  HGB 7.4* 8.2* 7.1*  HCT 24.0* 26.6* 24.1*  PLT 465* 467* 441*    Coag's No results for input(s): APTT, INR in the last 168 hours.  Sepsis Markers No results for input(s): LATICACIDVEN, PROCALCITON, O2SATVEN in the last 168 hours.  ABG Recent Labs  Lab 08/20/17 1254  PHART 7.459*  PCO2ART 32.5  PO2ART 92.0    Liver Enzymes Recent Labs  Lab 08/19/17 0805 08/21/17 0348 08/22/17 0400  AST 486* 186* 112*  ALT 375* 290* 231*  ALKPHOS 152* 189* 207*  BILITOT 1.0 1.2 1.3*  ALBUMIN 1.6* 1.7* 1.7*    Cardiac Enzymes No results for input(s): TROPONINI, PROBNP in the last 168 hours.  Glucose Recent Labs  Lab 08/20/17 1644 08/20/17 2344 08/21/17 0743 08/21/17 1545 08/21/17 2343 08/22/17 0744  GLUCAP 123* 121* 100* 143* 121* 97    Imaging No results found.   STUDIES:  CT abd/pelvis 12/04 >> free air, SB pneumatosis, b/l inguinal hernias, b/l renal cysts Echo 12/05 >> EF 45 to 50%, mild LVH, grade 1 DD CT abd/pelvis 12/12 >> extraluminal contrast, ascites, abscess Lt hepatic lobe area, RLL consolidation, b/l effusions CT chest/abd/pelvis 12/19 >> pelvic hematoma liquefaction, perisplenic abscess, atelectasis LHC 12/25 > normal coronaries, TV pacer placed with backup  rate of 40bpm. CT A/P 12/27 > multiple complicated fluid collections parasplenic and perihepatic fluid collections stable/decreased though now with multiple large abdominal/pelvic collections CT A/P 1/3 >>   CULTURES: Blood 12/04: E coli Blood 12/10: negative Surgical wound 12/12: Pan sensitive E. Coli Surgical drain 12/20: E coli Abdominal Abscess 12/29 >>   ANTIBIOTICS: Vancomycin 12/04 >> 12/04 Zosyn 12/04 >> 12/13 Anidulafungin 12/04 >> 12/17 Meropenem 12/14 >> 12/17 Zosyn 12/17 >> Anidulafungin 12/19 >> 12/24 Vanco 12/19 >>12/24  SIGNIFICANT EVENTS: 12/04  Admit 12/05 Start CRRT 12/06 Ileostomy, closure of abdominal wound 12/08 Off pressors 12/09 Off CRRT 12/12 Laparotomy for abscess, evacuation of hematoma  12/16 Off precedex  12/18 Bicarb added for NG acidosis, tx 1 unit PRBC 12/19 Decompensated, PEA arrest > intubated 12/20 IR drainage of LUQ abscess 12/25 [facility] and temp venous pacer placement 12/27 PPM set to VVI rate 30 12/29 Drain to RLQ by IR 12/31 Episodic brady events with pacing capture at 30, last 24h. Associated nausea.  Tolerating clears.  01/01 Tolerating liquid diet but poor intake.  TPN continues.  Bradycardia events   LINES/TUBES: Lt [place] CVL 12/04 >> 12/16 ETT 12/04 >> 12/04 RUE PICC 12/16 >> ETT 12/20 >> 12/24 R IJ temp venous pacer 12/26 >   CONSULTS 12/05 Renal, s/o 12/12 12/19 Wound care 12/20 IR  12/25 Cardiology 12/26 Palliative   DISCUSSION: 62 yo male with SB perforation, cecal ischemia complicated by sepsis with peritonitis, E coli bacteremia, multiple abdominal abscess with E coli, and acute renal failure.  Not candidate for further surgical intervention.  [facility] due to bradycardia and pauses requiring transvenous pacer.  Felt to be related to vagal response.  ASSESSMENT / PLAN:  PULMONARY A: Tachypnea P:   Monitor respiratory status  CARDIOVASCULAR A:  Chest Pain with Bradycardia - s/p LHC and temp pacer placement 12/25. A fib with RVR. Hypertension. P:  Tele monitoring  Cardiology (EP) following, appreciate input  Clee poor candidate for pacemaker placement due to infectious process DNR in the event of arest  Hydralazine 20 mg IV Q4 Labetalol PRN   RENAL A:   CKD III P:   D5W @50ml /hr Trend BMP / urinary output Replace electrolytes as indicated Avoid nephrotoxic agents, ensure adequate renal perfusion  GASTROINTESTINAL A:   Peritonitis secondary to Small Bowel Perforation & Cecal Ischemia  Severe Protein Calorie Malnutrition  P:    Post op care, diet per CCS  Zosyn as above  TPN per pharmacy  Await repeat CT abd 1/3  HEMATOLOGIC A:   Anemia of Critical Illness  P:  Trend CBC  Heparin SQ for DVT prophylaxis   INFECTIOUS A:   Peritonitis with E-Coli (pansensitive) Bacteremia  P:   Follow cultures from 12/29 Continue zosyn  Monitor fever curve / wbc trend   ENDOCRINE A:   Hyperglycemia    P:   SSI  NEUROLOGIC A:   Anxiety Pain  Deconditioning of critical illness P:   RASS goal: n/a Ativan 0.5mg  IV Q12 PRN  PRN morphine for pain Mobilize / PT efforts  FAMILY  - Updates:  Patient updated at bedside 1/3.  No family available.     Canary BrimBrandi Ollis, NP-C [facility] Pgr: [phone] or if no answer [phone] 08/22/2017, 9:19 AM

## 2017-08-22 NOTE — Progress Notes (Signed)
Pharmacy Antibiotic Note  Caleb LaundryFrederick Singh is a 62 y.o. male with peritonitis due to SB perforation and s/p bowel resection (12/4) .  He is noted with ecoli bacteremia and multiple abdominal abscesses with a drain in place . Plans noted for CT and antibiotics plans to be determined. Pharmacy has been consulted for zosyn dosing (started 12/17). -WBC= 12.9, SCr= 1.97 and CrCL ~ 40   Plan: -No zosyn dose changes needed -Will follow renal function and clinical progress    Height: 5\' 10"  (177.8 cm) Weight: 168 lb 12.8 oz (76.6 kg) IBW/kg (Calculated) : 73  Temp (24hrs), Avg:98.3 F (36.8 C), Min:97.7 F (36.5 C), Max:98.9 F (37.2 C)  Recent Labs  Lab 08/16/17 0340 08/18/17 0401 08/19/17 0805 08/20/17 0543 08/21/17 0348 08/22/17 0400  WBC 17.4* 16.5*  --  11.6* 13.8* 14.9*  CREATININE 1.98* 2.08* 2.00* 1.87*  --  1.97*    Estimated Creatinine Clearance: 39.6 mL/min (A) (by C-G formula based on SCr of 1.97 mg/dL (H)).    No Known Allergies  Antimicrobials this admission: 12/4 Vancomycin>>12/5 12/20 >>12/24 12/4 Zosyn>>12/14 12/17 >> 12/14 meropenem >> 12/17 12/4 Eraxis >> 12/17 12/20 >>12/24  Microbiology results: 12/4 MRSA PCR: neg 12/4BCx: Ecoli  (pan-sensitive)       12/5  BCID = E.coli, no resistance 12/10 BCx: NGF 12/11 UCx: NGF 12/12 abscess cx: E coli (pan-sensitive) 12/20 Abscess: E Coli (pan-sensitive)  Thank you for allowing pharmacy to be a part of this patient's care.  Harland GermanAndrew Edgerrin Correia, Pharm D 08/22/2017 8:33 AM

## 2017-08-22 NOTE — Progress Notes (Signed)
Perforated small bowel s/p resection , drainage of abscess and evacuation of pelvic hematoma, acute renal failure.  Bradycardia and pauses requiring pacer, conts on TPN, liq diet, zosyn, plan for re imaging of abd today.  Plan for SNF when medically stable.

## 2017-08-22 NOTE — Progress Notes (Addendum)
Physical Therapy Treatment Patient Details Name: Caleb LaundryFrederick Singh MRN: [id number] DOB: 1956/02/09 Today's Date: 08/22/2017    History of Present Illness Pt is a 62 yo male admitted 07/23/17 with intestinal perforation. Now s/p resection & ileostomy on 12/5 and 12/6; s/p abscess draining 12/12. VDRF. A fib with RVR. ETT 12/20 >> 12/24. 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 PMH includes ETOH abuse, PE.     PT Comments    Pt performed transfer from bed to chair.  He opted to limit transition from bed to chair due to fatigue from bathing.  Pt is requiring decreased assistance to transfer but still continues to benefit from skilled placement at SNF short term before return home.    BP pre session 150/90 BP post session 153/90     Follow Up Recommendations  SNF     Equipment Recommendations  Other (comment)(TBD at next venue)    Recommendations for Other Services       Precautions / Restrictions Precautions Precautions: Fall Precaution Comments: Multiple lines; multiple JP drains, temporary pacer Restrictions Weight Bearing Restrictions: No    Mobility  Bed Mobility Overal bed mobility: Needs Assistance Bed Mobility: Supine to Sit;Sit to Supine     Supine to sit: Min assist     General bed mobility comments: Pt able to advance LEs unassisted with bed in elevated position. Cues for hand placement and assistance to elevate trunk and scoot to edge of bed.    Transfers Overall transfer level: Needs assistance Equipment used: 4-wheeled walker(EVA walker) Transfers: Sit to/from Stand Sit to Stand: Mod assist;+2 safety/equipment Stand pivot transfers: +2 safety/equipment;Mod assist       General transfer comment: Cues for hand placement patient able to perform with +1 assistance during session this am.  Pt with improved transition of hand placement.    Ambulation/Gait Ambulation/Gait assistance: Mod assist Ambulation  Distance (Feet): (steps to turn from bed and sit in chair to drink contrast for his exam this pm.  ) Assistive device: 4-wheeled walker(EVA walker) Gait Pattern/deviations: Step-to pattern;Decreased stride length;Antalgic;Wide base of support Gait velocity: Decreased Gait velocity interpretation: Below normal speed for age/gender General Gait Details: Assistance to turn walker and assist patient to chair.  Pt required cues for sequencing and backing entirely to seated surface.     Stairs            Wheelchair Mobility    Modified Rankin (Stroke Patients Only)       Balance     Sitting balance-Leahy Scale: Poor       Standing balance-Leahy Scale: Poor                              Cognition Arousal/Alertness: Awake/alert Behavior During Therapy: WFL for tasks assessed/performed Overall Cognitive Status: Within Functional Limits for tasks assessed                                        Exercises      General Comments        Pertinent Vitals/Pain Pain Assessment: 0-10 Pain Score: 6  Pain Location: abdomen with movement Pain Descriptors / Indicators: Sore Pain Intervention(s): Monitored during session;Repositioned    Home Living                      Prior  Function            PT Goals (current goals can now be found in the care plan section) Acute Rehab PT Goals Patient Stated Goal: to regain PLOF Potential to Achieve Goals: Good Progress towards PT goals: Progressing toward goals    Frequency    Min 2X/week      PT Plan Current plan remains appropriate    Co-evaluation              AM-PAC PT "6 Clicks" Daily Activity  Outcome Measure  Difficulty turning over in bed (including adjusting bedclothes, sheets and blankets)?: Unable Difficulty moving from lying on back to sitting on the side of the bed? : Unable Difficulty sitting down on and standing up from a chair with arms (e.g., wheelchair, bedside  commode, etc,.)?: Unable Help needed moving to and from a bed to chair (including a wheelchair)?: A Lot Help needed walking in hospital room?: A Lot Help needed climbing 3-5 steps with a railing? : Total 6 Click Score: 8    End of Session Equipment Utilized During Treatment: Gait belt Activity Tolerance: Patient tolerated treatment well;Patient limited by fatigue Patient left: in chair;with call bell/phone within reach Nurse Communication: Mobility status       Time: 0852-0906 PT Time Calculation (min) (ACUTE ONLY): 14 min  Charges:  $Therapeutic Activity: 8-22 mins                    G CodesJoycelyn Singh, PTA pager [phone]'    Caleb Singh Caleb Singh 08/22/2017, 1:55 PM

## 2017-08-23 LAB — BASIC METABOLIC PANEL
ANION GAP: 7 (ref 5–15)
BUN: 37 mg/dL — ABNORMAL HIGH (ref 6–20)
CALCIUM: 8 mg/dL — AB (ref 8.9–10.3)
CO2: 23 mmol/L (ref 22–32)
Chloride: 107 mmol/L (ref 101–111)
Creatinine, Ser: 2.06 mg/dL — ABNORMAL HIGH (ref 0.61–1.24)
GFR, EST AFRICAN AMERICAN: 38 mL/min — AB (ref >60)
GFR, EST NON AFRICAN AMERICAN: 33 mL/min — AB (ref >60)
GLUCOSE: 198 mg/dL — AB (ref 65–99)
Potassium: 3.6 mmol/L (ref 3.5–5.1)
Sodium: 137 mmol/L (ref 135–145)

## 2017-08-23 LAB — CBC
HEMATOCRIT: 23.1 % — AB (ref 39.0–52.0)
Hemoglobin: 7.3 g/dL — ABNORMAL LOW (ref 13.0–17.0)
MCH: 29.2 pg (ref 26.0–34.0)
MCHC: 31.6 g/dL (ref 30.0–36.0)
MCV: 92.4 fL (ref 78.0–100.0)
PLATELETS: 441 10*3/uL — AB (ref 150–400)
RBC: 2.5 MIL/uL — ABNORMAL LOW (ref 4.22–5.81)
RDW: 16.8 % — AB (ref 11.5–15.5)
WBC: 11.8 10*3/uL — AB (ref 4.0–10.5)

## 2017-08-23 LAB — GLUCOSE, CAPILLARY
Glucose-Capillary: 94 mg/dL (ref 65–99)
Glucose-Capillary: 141 mg/dL — ABNORMAL HIGH (ref 65–99)
Glucose-Capillary: 95 mg/dL (ref 65–99)

## 2017-08-23 MED ORDER — BLISTEX MEDICATED EX OINT
TOPICAL_OINTMENT | CUTANEOUS | Status: DC | PRN
  Administered 2017-08-23 – 2017-08-24 (×3): via TOPICAL
  Filled 2017-08-23: qty 6.3

## 2017-08-23 MED ORDER — LIP MEDEX EX OINT: TOPICAL_OINTMENT | CUTANEOUS | Status: DC | PRN

## 2017-08-23 NOTE — Progress Notes (Signed)
CSW following for eventual SNF placement when pt is stable for DC.  Completed [facility] application with patient and was informed by [facility] that he has been approved for an initial 32 days at a contracted SNF- representative has sent list to CSW  CSW will continue to follow and assist with SNF placement when stable  Burna SisJenna H. Lachandra Dettmann, LCSW Clinical Social Worker [phone]

## 2017-08-23 NOTE — Progress Notes (Signed)
Patient ID: Caleb Singh, male   DOB: [birth year], 62 y.o.   MRN: [id number]    Referring Physician(s): Dr. Marin Olp  Supervising Physician: Richarda Overlie  Patient Status: [facility] - In-pt  Chief Complaint: Intra-abdominal fluid collections  Subjective: Patient just got up to a chair and has no new complaints.  Allergies: Patient has no known allergies.  Medications: Prior to Admission medications   Medication Sig Start Date End Date Taking? Authorizing Provider  clindamycin (CLEOCIN) 300 MG capsule Take 1 capsule (300 mg total) by mouth 3 (three) times daily. Patient not taking: Reported on 07/23/2017 04/21/16   Dominica Severin, MD  oxyCODONE (OXY IR/ROXICODONE) 5 MG immediate release tablet Take 1-2 tablets (5-10 mg total) by mouth every 3 (three) hours as needed for moderate pain. Patient not taking: Reported on 07/23/2017 04/21/16   Dominica Severin, MD    Vital Signs: BP (!) 138/91   Pulse (!) 109   Temp (!) 97.5 F (36.4 C) (Oral)   Resp (!) 22   Ht 5\' 10"  (1.778 m)   Wt 161 lb 2.5 oz (73.1 kg)   SpO2 98%   BMI 23.12 kg/m   Physical Exam: Abd: LUQ drain was removed with no issues.  RLQ with old bloody drainage output.  Drain site is c/d/i  Imaging: Ct Abdomen Pelvis Wo Contrast  Result Date: 08/22/2017 CLINICAL DATA:  62 year old male with a history of distal small bowel perforation status post ileo seek ectomy complicated by intra-abdominal abscess formation. Patient has a percutaneous drainage catheter in the left subdiaphragmatic space, a percutaneous drainage catheter in the right lower quadrant as well as 2 surgically placed drains in the right upper quadrant. Current output is approximately 20-30 mL per day. EXAM: CT ABDOMEN AND PELVIS WITHOUT CONTRAST TECHNIQUE: Multidetector CT imaging of the abdomen and pelvis was performed following the standard protocol without IV contrast. COMPARISON:  Multiple prior studies, most recent CT scan of the abdomen and pelvis  12/227/2018 FINDINGS: Lower chest: Improving left-sided pleural effusion. Persistent bibasilar atelectasis. Stable cardiac and visualized mediastinal contours. Unremarkable distal thoracic esophagus. Hepatobiliary: Normal hepatic contour morphology. No discrete hepatic lesion. Gallbladder distension with mild gallbladder wall thickening. No intra or extrahepatic biliary ductal dilatation. Pancreas: Unremarkable. No pancreatic ductal dilatation or surrounding inflammatory changes. Spleen: Normal in size without focal abnormality. Adrenals/Urinary Tract: Normal appearance of the adrenal glands. Limited evaluation the kidneys in the absence of intravenous contrast. No evidence of hydronephrosis or nephrolithiasis. Stable complex/hemorrhagic subcentimeter cyst in the lower pole of left kidney. Stable 2.8 cm simple cyst in the interpolar right kidney. Ureters and bladder are unremarkable. Stomach/Bowel: Surgical changes of ileocecectomy with diverting RLQ ileostomy are again evident. Persistent diffuse mild small bowel wall thickening. Several loops of small bowel are mildly dilated. No overt obstruction. Vascular/Lymphatic: Limited evaluation in the absence of intravenous contrast. No aneurysm. Reproductive: Prostate is unremarkable. Other: The left upper quadrant perisplenic drainage catheter remains in good position. No measurable residual fluid in this region. Two surgically placed drains are present in the right upper quadrant anterior and postero inferior to the liver. No significant residual measurable fluid collection about the liver. The most recently placed drainage catheter in the right lower quadrant remains in good position. There is been significant interval decrease in the volume of the high attenuation fluid collection in the right pericolic gutter. The residual fluid component measures approximately 4.7 x 1.8 cm compared to 7.2 x 4.0 cm previously. The high attenuation collection in in the anatomic  pelvis  is also smaller in size presently measuring 3.9 by 4.1 cm compared to 4.5 x 4.8 cm. A high attenuation collection in the region of the ileal mesenteries is also slightly smaller in size measuring approximately 4 4.7 x 3.5 cm compared to 5.4 x 3.9 cm previously. High attenuation fluid within the left pericolic gutter extending inferiorly into the bilateral inguinal recesses is also smaller compared to prior imaging. An index pocket of the fluid measured in the left pericolic gutter at the level of the mid kidneys measures 5.6 x 2.1 cm today compared to 7.0 x 2.6 cm previously. Musculoskeletal: No acute osseous abnormality. IMPRESSION: 1. Overall, significant interval improvement in multiple intra-abdominal fluid collections compared to 08/15/2017 as detailed below. 2. The left upper quadrant perisplenic drainage catheter remains in good position and there is no residual measurable fluid collection. 3. The 2 surgical drains in the perihepatic space remain in good position with no significant residual measurable fluid. 4. The most recently placed right lower quadrant drainage catheter remains in good position with persistent but decreasing fluid in the right pericolic gutter. 5. The undrained intermediate attenuation fluid within the left pericolic gutter, ileal mesenteries, anatomic pelvis and bilateral inguinal recesses is all decreasing in volume. 6. The gallbladder distension with mild gallbladder wall thickening is nonspecific and may be secondary to prolonged NPO status versus acalculous cholecystitis in the appropriate clinical setting. 7. Improving left-sided pleural effusion and bibasilar atelectasis. 8. Persistent diffuse mesenteric stranding and small bowel wall thickening consistent with evolving postsurgical changes and residual inflammation. Electronically Signed   By: Malachy Moan M.D.   On: 08/22/2017 13:49   Dg Abd Portable 1v  Result Date: 08/20/2017 CLINICAL DATA:  Initial evaluation  for abdominal pain. History of multiple abdominal surgeries. EXAM: PORTABLE ABDOMEN - 1 VIEW COMPARISON:  Prior CT from 08/15/2017. FINDINGS: Bowel gas pattern is within normal limits without evidence for obstruction or ileus. Percutaneous pigtail drains overlie the left upper and right upper quadrants. Additional draining at the subhepatic right upper quadrant. Right lower quadrant colostomy in place. No soft tissue mass or abnormal calcification. No free air on this limited supine view the abdomen. Prominent degenerative changes noted within the lower lumbar spine about the hips bilaterally. Probable atelectatic changes noted at the lung bases. IMPRESSION: 1. Nonobstructive bowel gas pattern with no radiographic evidence for acute intra-abdominal pathology. 2. Multiple surgical drains in place as above with right lower quadrant colostomy. Electronically Signed   By: Rise Mu M.D.   On: 08/20/2017 22:26    Labs:  CBC: Recent Labs    08/20/17 0543 08/21/17 0348 08/22/17 0400 08/23/17 0401  WBC 11.6* 13.8* 14.9* 11.8*  HGB 7.4* 8.2* 7.1* 7.3*  HCT 24.0* 26.6* 24.1* 23.1*  PLT 465* 467* 441* 441*    COAGS: Recent Labs    07/23/17 1455 07/24/17 0350 07/31/17 0745 08/08/17 1329  INR 1.78 1.77 1.58 1.45    BMP: Recent Labs    08/19/17 0805 08/20/17 0543 08/22/17 0400 08/23/17 0401  NA 134* 133* 132* 137  K 3.9 3.7 3.7 3.6  CL 105 104 101 107  CO2 23 23 22 23   GLUCOSE 384* 381* 295* 198*  BUN 51* 47* 38* 37*  CALCIUM 8.1* 8.0* 7.7* 8.0*  CREATININE 2.00* 1.87* 1.97* 2.06*  GFRNONAA 34* 37* 34* 33*  GFRAA 39* 42* 40* 38*    LIVER FUNCTION TESTS: Recent Labs    08/15/17 0802 08/19/17 0805 08/21/17 0348 08/22/17 0400  BILITOT 1.2 1.0 1.2 1.3*  AST 375* 486* 186* 112*  ALT 145* 375* 290* 231*  ALKPHOS 142* 152* 189* 207*  PROT 6.6 6.9 7.3 7.1  ALBUMIN 1.4* 1.6* 1.7* 1.7*    Assessment and Plan: 1. Multiple abdominal fluid collections, s/p perc  drains  Repeat CT scan showed resolution of the LUQ fluid collection.  This drain was removed with no issues after discussion with Dr. Janee Mornhompson.  The RLQ drain shows improvement, but not complete resolution.  This will be left in place for now.  Continue to follow.  Electronically Signed: Letha CapeKelly E Isair Inabinet 08/23/2017, 2:22 PM   I spent a total of 15 Minutes at the the patient's bedside AND on the patient's hospital floor or unit, greater than 50% of which was counseling/coordinating care for intra-abdominal fluid collection

## 2017-08-23 NOTE — Progress Notes (Signed)
Occupational Therapy Treatment Patient Details Name: Caleb Singh MRN: [id number] DOB: 04-08-1956 Today's Date: 08/23/2017    History of present illness Pt is a 62 yo male admitted 07/23/17 with intestinal perforation. Now s/p resection & ileostomy on 12/5 and 12/6; s/p abscess draining 12/12. VDRF. A fib with RVR. ETT 12/20 >> 12/24. 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 PMH includes ETOH abuse, PE.    OT comments  Pt making slow, but steady progress with OT.  He was very anxious initially about transferring to recliner due to level of fatigue.  He was agreeable to transfer using Stedy, with anxiety decreasing.  He required min A +2.  While sitting in recliner, he performed grooming with set up assist.  Will continue to follow.   Follow Up Recommendations  SNF    Equipment Recommendations  3 in 1 bedside commode    Recommendations for Other Services      Precautions / Restrictions Precautions Precautions: Fall Precaution Comments: Multiple lines; multiple JP drains, temporary pacer       Mobility Bed Mobility Overal bed mobility: Needs Assistance Bed Mobility: Supine to Sit     Supine to sit: Min assist     General bed mobility comments: assist to lift trunk   Transfers Overall transfer level: Needs assistance Equipment used: Ambulation equipment used Transfers: Sit to/from Stand Sit to Stand: Min assist;+2 safety/equipment         General transfer comment: Pt stood x 2 with steady and min A +2 for safety     Balance Overall balance assessment: Needs assistance Sitting-balance support: Bilateral upper extremity supported;Feet supported Sitting balance-Leahy Scale: Poor Sitting balance - Comments: min guard assist and bil. UE support    Standing balance support: Bilateral upper extremity supported Standing balance-Leahy Scale: Poor Standing balance comment: reliant on UE support and min A                             ADL either performed or assessed with clinical judgement   ADL Overall ADL's : Needs assistance/impaired Eating/Feeding: NPO   Grooming: Wash/dry hands;Wash/dry face;Oral care;Set up;Sitting                               Functional mobility during ADLs: Minimal assistance;+2 for safety/equipment       Vision       Perception     Praxis      Cognition Arousal/Alertness: Awake/alert Behavior During Therapy: WFL for tasks assessed/performed;Anxious Overall Cognitive Status: Within Functional Limits for tasks assessed                                          Exercises     Shoulder Instructions       General Comments Pt reports feeling fatigued.  He was very anxious about moving OOB to recliner due to fatigue.  Discussed options, and pt willing to attempt using Stedy - anxiety appeared to improve with use of stedy     Pertinent Vitals/ Pain       Pain Assessment: Faces Faces Pain Scale: Hurts little more Pain Location: abdomen with movement Pain Descriptors / Indicators: Sore Pain Intervention(s): Monitored during session  Home Living  Prior Functioning/Environment              Frequency  Min 2X/week        Progress Toward Goals  OT Goals(current goals can now be found in the care plan section)  Progress towards OT goals: Progressing toward goals     Plan Discharge plan remains appropriate    Co-evaluation                 AM-PAC PT "6 Clicks" Daily Activity     Outcome Measure   Help from another person eating meals?: A Little Help from another person taking care of personal grooming?: A Lot Help from another person toileting, which includes using toliet, bedpan, or urinal?: A Lot Help from another person bathing (including washing, rinsing, drying)?: A Lot Help from another person to put on and taking off regular upper body  clothing?: A Lot Help from another person to put on and taking off regular lower body clothing?: A Lot 6 Click Score: 13    End of Session Equipment Utilized During Treatment: Gait belt;Rolling walker  OT Visit Diagnosis: Muscle weakness (generalized) (M62.81)   Activity Tolerance Patient limited by fatigue   Patient Left with call bell/phone within reach;with family/visitor present;in chair   Nurse Communication Mobility status        Time: 2130-8657 OT Time Calculation (min): 34 min  Charges: OT General Charges $OT Visit: 1 Visit OT Treatments $Self Care/Home Management : 8-22 mins $Therapeutic Activity: 8-22 mins  [employer], [name] [phone]    Jeani Hawking M 08/23/2017, 3:19 PM

## 2017-08-23 NOTE — Clinical Social Work Note (Signed)
Clinical Social Work Assessment  Patient Details  Name: Caleb Singh MRN: [id number] Date of Birth: May 21, 1956  Date of referral:  08/23/17               Reason for consult:  Facility Placement                Permission sought to share information with:  Facility Medical sales representativeContact Representative, Sports coachCase Manager, Family Supports Permission granted to share information::  Yes, Verbal Permission Granted  Name::     Caleb ModestErica Singh, Tenet HealthcareDesiree Singh  Agency::  [facility]  Relationship::  VA case worker, sister  SolicitorContact Information:     Housing/Transportation Living arrangements for the past 2 months:  [employer] Source of Information:  Patient Patient Interpreter Needed:  None Criminal Activity/Legal Involvement Pertinent to Current Situation/Hospitalization:  No - Comment as needed Significant Relationships:  Siblings Lives with:  Siblings Do you feel safe going back to the place where you live?  No Need for family participation in patient care:  No (Coment)  Care giving concerns:  Pt lived in apartment by himself.  Pt has sister who he is close with but who cannot offer much physical assistance- pt states they help each other out when needed.  Pt also has a brother who he states is a Haematologistloner and would not be much assistance.  Pt would not have sufficient assistance to return home given current impairment.   Social Worker assessment / plan:  CSW spoke with pt about discharge planning and PT recommendation for SNF.  Pt acknowledges that he is needing more help than normal and would be unsafe at home alone.  CSW confirmed [place].  Explained [facility] process for rehab placement- that [facility] and [facility]  Employment status:  Retired, Disabled (Comment on whether or not currently receiving Disability) SSDI  Insurance information:  VA Benefit PT Recommendations:  Skilled Nursing Facility Information /  Referral to community resources:  Skilled Nursing Facility  Patient/Family's Response to care:  Pt is agreeable to plan for SNF when discharged- understands that this is a long process and that he will not be back to baseline without more care.  Patient/Family's Understanding of and Emotional Response to Diagnosis, Current Treatment, and Prognosis:  Pt seems to have good understanding of condition and was in decent spirits despite long hospital stay.  Hopeful that he will improve and be able to return to living independently.  Emotional Assessment Appearance:  Appears stated age Attitude/Demeanor/Rapport:    Affect (typically observed):  Appropriate Orientation:  Oriented to Self, Oriented to Place, Oriented to  Time, Oriented to Situation Alcohol / Substance use:  Not Applicable Psych involvement (Current and /or in the community):  No (Comment)  Discharge Needs  Concerns to be addressed:  Care Coordination Readmission within the last 30 days:  No Current discharge risk:  Physical Impairment Barriers to Discharge:  Continued Medical Work up   Burna SisUris, [address] H, LCSW 08/23/2017, 10:58 AM

## 2017-08-23 NOTE — Progress Notes (Signed)
10 Days Post-Op   Subjective/Chief Complaint: Did well with Ensure and fulls   Objective: Vital signs in last 24 hours: Temp:  [97.6 F (36.4 C)-98.9 F (37.2 C)] 98 F (36.7 C) (01/04 0400) Pulse Rate:  [92-118] 107 (01/04 0400) Resp:  [25-34] 26 (01/04 0400) BP: (120-166)/(64-90) 146/75 (01/04 0400) SpO2:  [96 %-100 %] 99 % (01/04 0400) Weight:  [73.1 kg (161 lb 2.5 oz)] 73.1 kg (161 lb 2.5 oz) (01/04 0409) Last BM Date: 08/22/17(ileostomy noted and flowing)  Intake/Output from previous day: 01/03 0701 - 01/04 0700 In: 2517 [P.O.:1347; I.V.:1050; IV Piggyback:100] Out: 3835 [Urine:1450; Drains:35; Stool:2350] Intake/Output this shift: No intake/output data recorded.  General appearance: cooperative Resp: clear to auscultation bilaterally Cardio: regular rate and rhythm GI: soft, wound stable fascial dehis with slough, +BS, ostomy pink with output  Lab Results:  Recent Labs    08/22/17 0400 08/23/17 0401  WBC 14.9* 11.8*  HGB 7.1* 7.3*  HCT 24.1* 23.1*  PLT 441* 441*   BMET Recent Labs    08/22/17 0400 08/23/17 0401  NA 132* 137  K 3.7 3.6  CL 101 107  CO2 22 23  GLUCOSE 295* 198*  BUN 38* 37*  CREATININE 1.97* 2.06*  CALCIUM 7.7* 8.0*   PT/INR No results for input(s): LABPROT, INR in the last 72 hours. ABG Recent Labs    08/20/17 1254  PHART 7.459*  HCO3 23.1    Studies/Results: Ct Abdomen Pelvis Wo Contrast  Result Date: 08/22/2017 CLINICAL DATA:  62 year old male with a history of distal small bowel perforation status post ileo seek ectomy complicated by intra-abdominal abscess formation. Patient has a percutaneous drainage catheter in the left subdiaphragmatic space, a percutaneous drainage catheter in the right lower quadrant as well as 2 surgically placed drains in the right upper quadrant. Current output is approximately 20-30 mL per day. EXAM: CT ABDOMEN AND PELVIS WITHOUT CONTRAST TECHNIQUE: Multidetector CT imaging of the abdomen and  pelvis was performed following the standard protocol without IV contrast. COMPARISON:  Multiple prior studies, most recent CT scan of the abdomen and pelvis 12/227/2018 FINDINGS: Lower chest: Improving left-sided pleural effusion. Persistent bibasilar atelectasis. Stable cardiac and visualized mediastinal contours. Unremarkable distal thoracic esophagus. Hepatobiliary: Normal hepatic contour morphology. No discrete hepatic lesion. Gallbladder distension with mild gallbladder wall thickening. No intra or extrahepatic biliary ductal dilatation. Pancreas: Unremarkable. No pancreatic ductal dilatation or surrounding inflammatory changes. Spleen: Normal in size without focal abnormality. Adrenals/Urinary Tract: Normal appearance of the adrenal glands. Limited evaluation the kidneys in the absence of intravenous contrast. No evidence of hydronephrosis or nephrolithiasis. Stable complex/hemorrhagic subcentimeter cyst in the lower pole of left kidney. Stable 2.8 cm simple cyst in the interpolar right kidney. Ureters and bladder are unremarkable. Stomach/Bowel: Surgical changes of ileocecectomy with diverting RLQ ileostomy are again evident. Persistent diffuse mild small bowel wall thickening. Several loops of small bowel are mildly dilated. No overt obstruction. Vascular/Lymphatic: Limited evaluation in the absence of intravenous contrast. No aneurysm. Reproductive: Prostate is unremarkable. Other: The left upper quadrant perisplenic drainage catheter remains in good position. No measurable residual fluid in this region. Two surgically placed drains are present in the right upper quadrant anterior and postero inferior to the liver. No significant residual measurable fluid collection about the liver. The most recently placed drainage catheter in the right lower quadrant remains in good position. There is been significant interval decrease in the volume of the high attenuation fluid collection in the right pericolic gutter.  The residual fluid  component measures approximately 4.7 x 1.8 cm compared to 7.2 x 4.0 cm previously. The high attenuation collection in in the anatomic pelvis is also smaller in size presently measuring 3.9 by 4.1 cm compared to 4.5 x 4.8 cm. A high attenuation collection in the region of the ileal mesenteries is also slightly smaller in size measuring approximately 4 4.7 x 3.5 cm compared to 5.4 x 3.9 cm previously. High attenuation fluid within the left pericolic gutter extending inferiorly into the bilateral inguinal recesses is also smaller compared to prior imaging. An index pocket of the fluid measured in the left pericolic gutter at the level of the mid kidneys measures 5.6 x 2.1 cm today compared to 7.0 x 2.6 cm previously. Musculoskeletal: No acute osseous abnormality. IMPRESSION: 1. Overall, significant interval improvement in multiple intra-abdominal fluid collections compared to 08/15/2017 as detailed below. 2. The left upper quadrant perisplenic drainage catheter remains in good position and there is no residual measurable fluid collection. 3. The 2 surgical drains in the perihepatic space remain in good position with no significant residual measurable fluid. 4. The most recently placed right lower quadrant drainage catheter remains in good position with persistent but decreasing fluid in the right pericolic gutter. 5. The undrained intermediate attenuation fluid within the left pericolic gutter, ileal mesenteries, anatomic pelvis and bilateral inguinal recesses is all decreasing in volume. 6. The gallbladder distension with mild gallbladder wall thickening is nonspecific and may be secondary to prolonged NPO status versus acalculous cholecystitis in the appropriate clinical setting. 7. Improving left-sided pleural effusion and bibasilar atelectasis. 8. Persistent diffuse mesenteric stranding and small bowel wall thickening consistent with evolving postsurgical changes and residual inflammation.  Electronically Signed   By: Malachy Moan M.D.   On: 08/22/2017 13:49    Anti-infectives: Anti-infectives (From admission, onward)   Start     Dose/Rate Route Frequency Ordered Stop   08/10/17 0500  vancomycin (VANCOCIN) 1,500 mg in sodium chloride 0.9 % 500 mL IVPB  Status:  Discontinued     1,500 mg 250 mL/hr over 120 Minutes Intravenous Every 48 hours 08/08/17 0728 08/12/17 1018   08/08/17 2200  anidulafungin (ERAXIS) 100 mg in sodium chloride 0.9 % 100 mL IVPB  Status:  Discontinued     100 mg 78 mL/hr over 100 Minutes Intravenous Every 24 hours 08/08/17 0524 08/12/17 1018   08/08/17 0445  vancomycin (VANCOCIN) 1,500 mg in sodium chloride 0.9 % 500 mL IVPB     1,500 mg 250 mL/hr over 120 Minutes Intravenous  Once 08/08/17 0431 08/08/17 0659   08/08/17 0430  vancomycin (VANCOCIN) 1,250 mg in sodium chloride 0.9 % 250 mL IVPB  Status:  Discontinued     1,250 mg 166.7 mL/hr over 90 Minutes Intravenous  Once 08/08/17 0418 08/08/17 0428   08/08/17 0415  anidulafungin (ERAXIS) 200 mg in sodium chloride 0.9 % 200 mL IVPB     200 mg 78 mL/hr over 200 Minutes Intravenous  Once 08/08/17 0413 08/08/17 0816   08/05/17 1000  piperacillin-tazobactam (ZOSYN) IVPB 3.375 g     3.375 g 12.5 mL/hr over 240 Minutes Intravenous Every 8 hours 08/05/17 0946     08/02/17 1400  meropenem (MERREM) 1 g in sodium chloride 0.9 % 100 mL IVPB  Status:  Discontinued     1 g 200 mL/hr over 30 Minutes Intravenous Every 12 hours 08/02/17 1111 08/05/17 0945   07/29/17 0600  piperacillin-tazobactam (ZOSYN) IVPB 3.375 g  Status:  Discontinued     3.375 g  12.5 mL/hr over 240 Minutes Intravenous Every 8 hours 07/29/17 0518 08/02/17 1036   07/25/17 2000  vancomycin (VANCOCIN) IVPB 1000 mg/200 mL premix  Status:  Discontinued     1,000 mg 200 mL/hr over 60 Minutes Intravenous Every 48 hours 07/23/17 2042 07/24/17 0956   07/24/17 2200  piperacillin-tazobactam (ZOSYN) 3.375 g in dextrose 5 % 50 mL IVPB  Status:   Discontinued     3.375 g 100 mL/hr over 30 Minutes Intravenous Every 6 hours 07/24/17 2006 07/29/17 0518   07/24/17 2100  anidulafungin (ERAXIS) 100 mg in sodium chloride 0.9 % 100 mL IVPB  Status:  Discontinued     100 mg 78 mL/hr over 100 Minutes Intravenous Every 24 hours 07/23/17 2039 08/05/17 0945   07/24/17 0000  anidulafungin (ERAXIS) 100 mg in sodium chloride 0.9 % 100 mL IVPB  Status:  Discontinued     100 mg 78 mL/hr over 100 Minutes Intravenous Every 24 hours 07/23/17 2038 07/23/17 2039   07/23/17 2200  piperacillin-tazobactam (ZOSYN) IVPB 3.375 g  Status:  Discontinued     3.375 g 12.5 mL/hr over 240 Minutes Intravenous Every 8 hours 07/23/17 2043 07/24/17 2006   07/23/17 2100  anidulafungin (ERAXIS) 200 mg in sodium chloride 0.9 % 200 mL IVPB     200 mg 78 mL/hr over 200 Minutes Intravenous  Once 07/23/17 2038 07/24/17 0239   07/23/17 2100  vancomycin (VANCOCIN) 1,500 mg in sodium chloride 0.9 % 500 mL IVPB     1,500 mg 250 mL/hr over 120 Minutes Intravenous  Once 07/23/17 2041 07/23/17 2317   07/23/17 1515  piperacillin-tazobactam (ZOSYN) IVPB 3.375 g     3.375 g 100 mL/hr over 30 Minutes Intravenous  Once 07/23/17 1511 07/23/17 1611      Assessment/Plan: Perforated distal small boweland ischemia of cecum 1.S/Pexploratory laparotomy, small bowel resection without anastomosis, placement of open wound VAC system 07/23/17 Dr. Gaynelle AduEric Wilson 2. S/p Reexploration of abdomen, ileocecectomy, creation of end ileostomy,closure of abdominal wall, 07/25/17, Dr. Almond LintFaera Byerly 3. Exploratory laparotomy, drainage of abdominal abscess and evacuation of pelvic hematoma, RUQ drain, and left lateral drain placement, 07/31/17, Dr. Glenna FellowsBenjamin Hoxworth (Findings:  Subdiaphragmatic and subhepatic abscesses.Large organizing pelvic hematoma. Apparent necrotic tissue left lobe of the liver.) Bacteremia - Enterobacteriaceae + E Coli, on abx per ccm, awaiting additional drain cultures Cardiac  arrest - Code Blue 08/08/17  Acute renal failure-CRRT discontinued 07/27/17  Malnutrition/Deconditioning - Tadvance to soft diet, taking Ensure Anemia - stable  GI-needs continued dressing changes, wound isdehiscedbut not much more to do is certainly at risk IRdrained collection posterior to anastomosis - small volume bilious fluid; continue IV abx drainage does not appear to be bilious ZO:[id number]:Vancomycin 07/23/17- 07/24/17,restarted 12/20 day 1,Zosyn 07/23/17- 12/14,anidulafungin 07/23/17 - 12/17,restarted 12/20 day 1,Meropenem 12/14 - 12/17 =>>Zosyn restarted 12/17 =>>anidulafunginrestarted 12/20 =>> Vancomycin restarted 12/20 =>>  WJX:BJYNVT:SCDs - Heparin Follow-up:Dr. Gaynelle AduEric Wilson   Disposition: F/U CT yesterday improved. D/C RUQ surgical JPs (2). Suspect IR will D/C LUQ drain. Continue IR placed drain R gutter (placed 12/29). That collection is getting smaller also. Therapies for deconditioning. Advance to soft doet.  LOS: 31 days    Liz MaladyBurke E Mekala Winger 08/23/2017

## 2017-08-23 NOTE — Progress Notes (Signed)
Electrophysiology Rounding Note  Patient Name: Caleb Singh Date of Encounter: 08/23/2017  Primary Cardiologist: Antoine Poche (new this admission)   Subjective   Tolerating diet.    Inpatient Medications    Scheduled Meds: . chlorhexidine  15 mL Mouth Rinse BID  . Chlorhexidine Gluconate Cloth  6 each Topical Daily  . feeding supplement  1 Container Oral BID BM  . feeding supplement (ENSURE ENLIVE)  237 mL Oral TID BM  . heparin  5,000 Units Subcutaneous Q8H  . hydrALAZINE  20 mg Intravenous Q4H  . insulin aspart  0-9 Units Subcutaneous Q8H  . mouth rinse  15 mL Mouth Rinse q12n4p  . pantoprazole (PROTONIX) IV  40 mg Intravenous Q24H  . sodium chloride flush  10-40 mL Intracatheter Q12H   Continuous Infusions: . dextrose 50 mL/hr at 08/23/17 0400  . piperacillin-tazobactam (ZOSYN)  IV 3.375 g (08/23/17 0128)   PRN Meds: acetaminophen, iopamidol, labetalol, LORazepam, morphine injection, nitroGLYCERIN, ondansetron (ZOFRAN) IV, sodium chloride flush   Vital Signs    Vitals:   08/23/17 0300 08/23/17 0355 08/23/17 0400 08/23/17 0409  BP: 133/75 133/75 (!) 146/75   Pulse: 96  (!) 107   Resp: (!) 26  (!) 26   Temp:   98 F (36.7 C)   TempSrc:   Oral   SpO2: 97%  99%   Weight:    161 lb 2.5 oz (73.1 kg)  Height:        Intake/Output Summary (Last 24 hours) at 08/23/2017 0650 Last data filed at 08/23/2017 0400 Gross per 24 hour  Intake 2567 ml  Output 3835 ml  Net -1268 ml   Filed Weights   08/21/17 0500 08/22/17 0400 08/23/17 0409  Weight: 171 lb 8.3 oz (77.8 kg) 168 lb 12.8 oz (76.6 kg) 161 lb 2.5 oz (73.1 kg)    Physical Exam    GEN- The patient is ill appearing, alert and oriented x 3 today.   Head- normocephalic, atraumatic Eyes-  Sclera clear, conjunctiva pink Ears- hearing intact Oropharynx- clear Neck- supple, +RIJ Lungs- Clear to ausculation bilaterally, normal work of breathing Heart- Regular rate and rhythm  GI- soft, NT, ND, + BS Extremities-  no clubbing, cyanosis, or edema Skin- no rash or lesion Psych- euthymic mood, full affect Neuro- strength and sensation are intact  Labs    CBC Recent Labs    08/21/17 0348 08/22/17 0400 08/23/17 0401  WBC 13.8* 14.9* 11.8*  NEUTROABS 9.6*  --   --   HGB 8.2* 7.1* 7.3*  HCT 26.6* 24.1* 23.1*  MCV 93.7 92.3 92.4  PLT 467* 441* 441*   Basic Metabolic Panel Recent Labs    16/10/96 0400 08/23/17 0401  NA 132* 137  K 3.7 3.6  CL 101 107  CO2 22 23  GLUCOSE 295* 198*  BUN 38* 37*  CREATININE 1.97* 2.06*  CALCIUM 7.7* 8.0*  MG 1.9  --   PHOS 3.7  --    Liver Function Tests Recent Labs    08/21/17 0348 08/22/17 0400  AST 186* 112*  ALT 290* 231*  ALKPHOS 189* 207*  BILITOT 1.2 1.3*  PROT 7.3 7.1  ALBUMIN 1.7* 1.7*   Fasting Lipid Panel Recent Labs    08/21/17 0348  TRIG 269*     Telemetry    Sinus rhythm, 2 paced beats since yesterday morning  (personally reviewed)  Radiology    Ct Abdomen Pelvis Wo Contrast  Result Date: 08/22/2017 CLINICAL DATA:  62 year old male with a history of  distal small bowel perforation status post ileo seek ectomy complicated by intra-abdominal abscess formation. Patient has a percutaneous drainage catheter in the left subdiaphragmatic space, a percutaneous drainage catheter in the right lower quadrant as well as 2 surgically placed drains in the right upper quadrant. Current output is approximately 20-30 mL per day. EXAM: CT ABDOMEN AND PELVIS WITHOUT CONTRAST TECHNIQUE: Multidetector CT imaging of the abdomen and pelvis was performed following the standard protocol without IV contrast. COMPARISON:  Multiple prior studies, most recent CT scan of the abdomen and pelvis 12/227/2018 FINDINGS: Lower chest: Improving left-sided pleural effusion. Persistent bibasilar atelectasis. Stable cardiac and visualized mediastinal contours. Unremarkable distal thoracic esophagus. Hepatobiliary: Normal hepatic contour morphology. No discrete hepatic  lesion. Gallbladder distension with mild gallbladder wall thickening. No intra or extrahepatic biliary ductal dilatation. Pancreas: Unremarkable. No pancreatic ductal dilatation or surrounding inflammatory changes. Spleen: Normal in size without focal abnormality. Adrenals/Urinary Tract: Normal appearance of the adrenal glands. Limited evaluation the kidneys in the absence of intravenous contrast. No evidence of hydronephrosis or nephrolithiasis. Stable complex/hemorrhagic subcentimeter cyst in the lower pole of left kidney. Stable 2.8 cm simple cyst in the interpolar right kidney. Ureters and bladder are unremarkable. Stomach/Bowel: Surgical changes of ileocecectomy with diverting RLQ ileostomy are again evident. Persistent diffuse mild small bowel wall thickening. Several loops of small bowel are mildly dilated. No overt obstruction. Vascular/Lymphatic: Limited evaluation in the absence of intravenous contrast. No aneurysm. Reproductive: Prostate is unremarkable. Other: The left upper quadrant perisplenic drainage catheter remains in good position. No measurable residual fluid in this region. Two surgically placed drains are present in the right upper quadrant anterior and postero inferior to the liver. No significant residual measurable fluid collection about the liver. The most recently placed drainage catheter in the right lower quadrant remains in good position. There is been significant interval decrease in the volume of the high attenuation fluid collection in the right pericolic gutter. The residual fluid component measures approximately 4.7 x 1.8 cm compared to 7.2 x 4.0 cm previously. The high attenuation collection in in the anatomic pelvis is also smaller in size presently measuring 3.9 by 4.1 cm compared to 4.5 x 4.8 cm. A high attenuation collection in the region of the ileal mesenteries is also slightly smaller in size measuring approximately 4 4.7 x 3.5 cm compared to 5.4 x 3.9 cm previously. High  attenuation fluid within the left pericolic gutter extending inferiorly into the bilateral inguinal recesses is also smaller compared to prior imaging. An index pocket of the fluid measured in the left pericolic gutter at the level of the mid kidneys measures 5.6 x 2.1 cm today compared to 7.0 x 2.6 cm previously. Musculoskeletal: No acute osseous abnormality. IMPRESSION: 1. Overall, significant interval improvement in multiple intra-abdominal fluid collections compared to 08/15/2017 as detailed below. 2. The left upper quadrant perisplenic drainage catheter remains in good position and there is no residual measurable fluid collection. 3. The 2 surgical drains in the perihepatic space remain in good position with no significant residual measurable fluid. 4. The most recently placed right lower quadrant drainage catheter remains in good position with persistent but decreasing fluid in the right pericolic gutter. 5. The undrained intermediate attenuation fluid within the left pericolic gutter, ileal mesenteries, anatomic pelvis and bilateral inguinal recesses is all decreasing in volume. 6. The gallbladder distension with mild gallbladder wall thickening is nonspecific and may be secondary to prolonged NPO status versus acalculous cholecystitis in the appropriate clinical setting. 7. Improving left-sided pleural  effusion and bibasilar atelectasis. 8. Persistent diffuse mesenteric stranding and small bowel wall thickening consistent with evolving postsurgical changes and residual inflammation. Electronically Signed   By: Malachy MoanHeath  McCullough M.D.   On: 08/22/2017 13:49     Assessment & Plan    1.  Sinus bradycardia In the setting of abdominal processes and with P-P and R-R lengthening suggestive of vagal process His pacing burden has decreased significantly Temp wire still functioning  Will discuss with Dr Graciela HusbandsKlein - ?turn temp pacer off today and see how he does without backup brady pacing  Signed, Gypsy BalsamAmber  Seiler, NP  08/23/2017, 6:50 AM   Seen and agree wih above   Agree with turning off pacer as it is ALSO FAILING TO SENSE appropriately   48 hrs ago tele also had a late couplet which may be pacer assoc  CT report per surgery -- good news as the trigger for vagal tone may well have been ongoing intraabdominal process

## 2017-08-23 NOTE — Progress Notes (Addendum)
0700 Pt sleeping, easy to arouse. NAD, pt seems to be in good spirits today.   0715 Pt assessed, see flow sheet, spoke with surgeon regarding drains, new orders received.   0800 RN at bedside to discontinue JP drains, pt tolerated well. RN assisted pt in ordering a new breakfast tray, diet was advanced.   1030 Pt medicated and repositioned. Assisted with 2nd breakfast but eggs cold, pt refused to eat. Ensure set up at bedside for pt.   1130 Pt sleeping comfortably, NAD.   1200 Pt medicated per MAR, repositioned in bed, pt is down about going to rehab post hospital stay. Encouraged pt to practice doing things for his self, ex. Using urinal, feeding self. Gypsy BalsamAmber Seiler, NP returned call, orders to turn off pacer received.   1400 OT here to work with pt, pt up to chair, tolerated well.   1445 Pt c/o left flank pain from IR JP drain removal, [facility] and [facility].   1600 Pt visiting with sister at bedside, NAD, WCTM.   1700 Pt assisted back to bed from chair, tolerated well.   1830 Pt sleeping comfortably, easy to arouse, drains emptied, lines, tubes, gtts, checked and verified, awaiting to give shift report to oncoming RN.

## 2017-08-23 NOTE — Progress Notes (Signed)
PULMONARY / CRITICAL CARE MEDICINE   Name: Caleb Singh MRN: [id number] DOB: 02/22/1956    ADMISSION DATE:  07/23/2017 CONSULTATION DATE:  07/23/17  REFERRING MD:  Dr. Andrey CampanileWilson, CCS  CHIEF COMPLAINT:  Abdominal Pain   BRIEF SUMMARY: 62 yo male former smoker presented with severe abdominal pain and found to have free air in abdomen from perforated SB and ischemic cecum.  S/p resection of terminal ileum.  Required re-look for drainage of abdominal abscesses and evacuation of pelvic hematoma.  SUBJECTIVE:  Pt reports "I have trench mouth".  Denies pain, SOB.  Anxious to get up and get moving / cleaned up.  No acute events overnight. Decreased pacing burden.   VITAL SIGNS: BP (!) 146/76 (BP Location: Left Arm)   Pulse 95   Temp 98.3 F (36.8 C) (Oral)   Resp (!) 26   Ht 5\' 10"  (1.778 m)   Wt 161 lb 2.5 oz (73.1 kg)   SpO2 98%   BMI 23.12 kg/m   HEMODYNAMICS:    VENTILATOR SETTINGS:    INTAKE / OUTPUT: I/O last 3 completed shifts: In: 3477 [P.O.:1587; I.V.:1720; Other:20; IV Piggyback:150] Out: 5172 [Urine:2400; Drains:72; Stool:2700]  PHYSICAL EXAMINATION: General:  Ill appearing male in NAD (overall improved) HEENT: MM pink/dry, no jvd, R IJ cordis with pacing wire PSY: calm/appropriate  Neuro: AAOx4, speech clear, MAE CV: s1s2 rrr, no m/r/g PULM: even/non-labored, lungs bilaterally clear  GI: soft, multiple JP drains, dressing midline c/d/i, ileostomy   Extremities: warm/dry, no edema  Skin: no rashes or lesions   LABS:  BMET Recent Labs  Lab 08/20/17 0543 08/22/17 0400 08/23/17 0401  NA 133* 132* 137  K 3.7 3.7 3.6  CL 104 101 107  CO2 23 22 23   BUN 47* 38* 37*  CREATININE 1.87* 1.97* 2.06*  GLUCOSE 381* 295* 198*    Electrolytes Recent Labs  Lab 08/19/17 0805 08/20/17 0543 08/22/17 0400 08/23/17 0401  CALCIUM 8.1* 8.0* 7.7* 8.0*  MG 2.1 2.0 1.9  --   PHOS 3.1  --  3.7  --     CBC Recent Labs  Lab 08/21/17 0348 08/22/17 0400  08/23/17 0401  WBC 13.8* 14.9* 11.8*  HGB 8.2* 7.1* 7.3*  HCT 26.6* 24.1* 23.1*  PLT 467* 441* 441*    Coag's No results for input(s): APTT, INR in the last 168 hours.  Sepsis Markers No results for input(s): LATICACIDVEN, PROCALCITON, O2SATVEN in the last 168 hours.  ABG Recent Labs  Lab 08/20/17 1254  PHART 7.459*  PCO2ART 32.5  PO2ART 92.0    Liver Enzymes Recent Labs  Lab 08/19/17 0805 08/21/17 0348 08/22/17 0400  AST 486* 186* 112*  ALT 375* 290* 231*  ALKPHOS 152* 189* 207*  BILITOT 1.0 1.2 1.3*  ALBUMIN 1.6* 1.7* 1.7*    Cardiac Enzymes No results for input(s): TROPONINI, PROBNP in the last 168 hours.  Glucose Recent Labs  Lab 08/21/17 2343 08/22/17 0744 08/22/17 1147 08/22/17 1520 08/22/17 2316 08/23/17 0755  GLUCAP 121* 97 101* 98 103* 94    Imaging Ct Abdomen Pelvis Wo Contrast  Result Date: 08/22/2017 CLINICAL DATA:  62 year old male with a history of distal small bowel perforation status post ileo seek ectomy complicated by intra-abdominal abscess formation. Patient has a percutaneous drainage catheter in the left subdiaphragmatic space, a percutaneous drainage catheter in the right lower quadrant as well as 2 surgically placed drains in the right upper quadrant. Current output is approximately 20-30 mL per day. EXAM: CT ABDOMEN AND PELVIS WITHOUT  CONTRAST TECHNIQUE: Multidetector CT imaging of the abdomen and pelvis was performed following the standard protocol without IV contrast. COMPARISON:  Multiple prior studies, most recent CT scan of the abdomen and pelvis 12/227/2018 FINDINGS: Lower chest: Improving left-sided pleural effusion. Persistent bibasilar atelectasis. Stable cardiac and visualized mediastinal contours. Unremarkable distal thoracic esophagus. Hepatobiliary: Normal hepatic contour morphology. No discrete hepatic lesion. Gallbladder distension with mild gallbladder wall thickening. No intra or extrahepatic biliary ductal dilatation.  Pancreas: Unremarkable. No pancreatic ductal dilatation or surrounding inflammatory changes. Spleen: Normal in size without focal abnormality. Adrenals/Urinary Tract: Normal appearance of the adrenal glands. Limited evaluation the kidneys in the absence of intravenous contrast. No evidence of hydronephrosis or nephrolithiasis. Stable complex/hemorrhagic subcentimeter cyst in the lower pole of left kidney. Stable 2.8 cm simple cyst in the interpolar right kidney. Ureters and bladder are unremarkable. Stomach/Bowel: Surgical changes of ileocecectomy with diverting RLQ ileostomy are again evident. Persistent diffuse mild small bowel wall thickening. Several loops of small bowel are mildly dilated. No overt obstruction. Vascular/Lymphatic: Limited evaluation in the absence of intravenous contrast. No aneurysm. Reproductive: Prostate is unremarkable. Other: The left upper quadrant perisplenic drainage catheter remains in good position. No measurable residual fluid in this region. Two surgically placed drains are present in the right upper quadrant anterior and postero inferior to the liver. No significant residual measurable fluid collection about the liver. The most recently placed drainage catheter in the right lower quadrant remains in good position. There is been significant interval decrease in the volume of the high attenuation fluid collection in the right pericolic gutter. The residual fluid component measures approximately 4.7 x 1.8 cm compared to 7.2 x 4.0 cm previously. The high attenuation collection in in the anatomic pelvis is also smaller in size presently measuring 3.9 by 4.1 cm compared to 4.5 x 4.8 cm. A high attenuation collection in the region of the ileal mesenteries is also slightly smaller in size measuring approximately 4 4.7 x 3.5 cm compared to 5.4 x 3.9 cm previously. High attenuation fluid within the left pericolic gutter extending inferiorly into the bilateral inguinal recesses is also  smaller compared to prior imaging. An index pocket of the fluid measured in the left pericolic gutter at the level of the mid kidneys measures 5.6 x 2.1 cm today compared to 7.0 x 2.6 cm previously. Musculoskeletal: No acute osseous abnormality. IMPRESSION: 1. Overall, significant interval improvement in multiple intra-abdominal fluid collections compared to 08/15/2017 as detailed below. 2. The left upper quadrant perisplenic drainage catheter remains in good position and there is no residual measurable fluid collection. 3. The 2 surgical drains in the perihepatic space remain in good position with no significant residual measurable fluid. 4. The most recently placed right lower quadrant drainage catheter remains in good position with persistent but decreasing fluid in the right pericolic gutter. 5. The undrained intermediate attenuation fluid within the left pericolic gutter, ileal mesenteries, anatomic pelvis and bilateral inguinal recesses is all decreasing in volume. 6. The gallbladder distension with mild gallbladder wall thickening is nonspecific and may be secondary to prolonged NPO status versus acalculous cholecystitis in the appropriate clinical setting. 7. Improving left-sided pleural effusion and bibasilar atelectasis. 8. Persistent diffuse mesenteric stranding and small bowel wall thickening consistent with evolving postsurgical changes and residual inflammation. Electronically Signed   By: Malachy Moan M.D.   On: 08/22/2017 13:49     STUDIES:  CT abd/pelvis 12/04 >> free air, SB pneumatosis, b/l inguinal hernias, b/l renal cysts Echo 12/05 >> EF  45 to 50%, mild LVH, grade 1 DD CT abd/pelvis 12/12 >> extraluminal contrast, ascites, abscess Lt hepatic lobe area, RLL consolidation, b/l effusions CT chest/abd/pelvis 12/19 >> pelvic hematoma liquefaction, perisplenic abscess, atelectasis LHC 12/25 > normal coronaries, TV pacer placed with backup rate of 40bpm. CT A/P 12/27 > multiple  complicated fluid collections parasplenic and perihepatic fluid collections stable/decreased though now with multiple large abdominal/pelvic collections CT A/P 1/3 >> overall significant improvement in fluid collections   CULTURES: Blood 12/04: E coli Blood 12/10: negative Surgical wound 12/12: Pan sensitive E. Coli Surgical drain 12/20: E coli Abdominal RLQ Abscess 12/29 >> negative  ANTIBIOTICS: Vancomycin 12/04 >> 12/04 Zosyn 12/04 >> 12/13 Anidulafungin 12/04 >> 12/17 Meropenem 12/14 >> 12/17 Zosyn 12/17 >> Anidulafungin 12/19 >> 12/24 Vanco 12/19 >>12/24  SIGNIFICANT EVENTS: 12/04  Admit 12/05  Start CRRT 12/06  Ileostomy, closure of abdominal wound 12/08  Off pressors 12/09  Off CRRT 12/12  Laparotomy for abscess, evacuation of hematoma  12/16  Off precedex  12/18  Bicarb added for NG acidosis, tx 1 unit PRBC 12/19  Decompensated, PEA arrest > intubated 12/20  IR drainage of LUQ abscess 12/25  Transferred to [facility] for LHC and temp venous pacer placement 12/27  PPM set to VVI rate 30 12/29  Drain to RLQ by IR 12/31  Brady events w pacing capture at 30, last 24h. Nausea. Tolerating clears.  01/01  Tolerating liquid diet but poor intake.  TPN continues.  Bradycardia events   LINES/TUBES: Lt [place] CVL 12/04 >> 12/16 ETT 12/04 >> 12/04 RUE PICC 12/16 >> ETT 12/20 >> 12/24 R IJ temp venous pacer 12/26 >   CONSULTS 12/05 Renal, s/o 12/12 12/19 Wound care 12/20 IR  12/25 Cardiology 12/26 Palliative   DISCUSSION: 62 yo male with SB perforation, cecal ischemia complicated by sepsis with peritonitis, E coli bacteremia, multiple abdominal abscess with E coli, and acute renal failure.  Not candidate for further surgical intervention.  Transferred [facility] to [facility] due to bradycardia and pauses requiring transvenous pacer.  Felt to be related to vagal response.  ASSESSMENT / PLAN:  PULMONARY A: Tachypnea - resolved  Acute Hypoxic Respiratory Failure - resolved  P:    Monitor respiratory status   CARDIOVASCULAR A:  Chest Pain with Bradycardia - s/p LHC and temp pacer placement 12/25. A fib with RVR. Hypertension. P:  Tele monitoring  Cardiology following, appreciate input  Poor candidate for pacemaker placement with infectious process / overall improved, hopeful he will not need pacemaker  DNR in the event of arrest  Hydralazine 20 mg IV Q4, consider transition to orals ?? Labetalol PRN   RENAL A:   CKD III - rising sr cr trend 1/4  P:   D5W @ 50 ml/hr  Trend BMP / urinary output Replace electrolytes as indicated Avoid nephrotoxic agents, ensure adequate renal perfusion  GASTROINTESTINAL A:   Peritonitis secondary to Small Bowel Perforation & Cecal Ischemia  Severe Protein Calorie Malnutrition  P:   Post op care, diet per CCS ABX as above    HEMATOLOGIC A:   Anemia of Critical Illness  P:  Trend CBC  Heparin SQ for DVT prophylaxis   INFECTIOUS A:   Peritonitis with E-Coli (pansensitive) Bacteremia  P:   Zosyn as above, ? Duration > consider monitoring with drain removal over next 48 hours before change Monitor fever curve / WBC trend  ENDOCRINE A:   Hyperglycemia    P:   SSI  NEUROLOGIC A:  Anxiety Pain  Deconditioning of critical illness P:   Ativan 0.5mg  IV Q12 PRN  PRN morphine for pain  Mobilize / PT efforts   FAMILY  - Updates:  Patient updated on plan of care 1/4 am.  Sister updated pm 1/3 at bedside.   Canary Brim, NP-C [facility] Pgr: [phone] or if no answer [phone] 08/23/2017, 8:37 AM

## 2017-08-23 NOTE — NC FL2 (Signed)
[place] MEDICAID FL2 LEVEL OF CARE SCREENING TOOL     IDENTIFICATION  Patient Name: Caleb Singh Birthdate: [birth year] Sex: male Admission Date (Current Location): 07/23/2017  [facility] [place] Number:  Producer, television/film/video and Address:  [facility], [address], [place], [place] [address]      Provider Number: [id number]  Attending Physician Name and Address:  Kalman Shan, MD  Relative Name and Phone Number:       Current Level of Care: Hospital Recommended Level of Care: Skilled Nursing Facility Prior Approval Number:    Date Approved/Denied:   PASRR Number: [id number]  Discharge Plan: SNF    Current Diagnoses: Patient Active Problem List   Diagnosis Date Noted  . Bradycardia   . Postoperative intra-abdominal abscess   . Acute encephalopathy   . DNR (do not resuscitate) discussion   . Palliative care by specialist   . Weakness generalized   . Septic shock (HCC)   . Pressure injury of skin 08/07/2017  . Tachypnea   . Acute delirium   . Intestinal perforation s/p SB resection/ileostomy 07/24/2017 & 07/25/2017 07/27/2017  . Ileostomy in place [facility]) 07/27/2017  . Acute respiratory failure (HCC)   . AKI (acute kidney injury) (HCC)   . Free intraperitoneal air 07/23/2017  . Abscess of third finger of right hand 04/17/2016    Orientation RESPIRATION BLADDER Height & Weight     Self, Time, Situation, Place  Normal Continent Weight: 161 lb 2.5 oz (73.1 kg) Height:  5\' 10"  (177.8 cm)  BEHAVIORAL SYMPTOMS/MOOD NEUROLOGICAL BOWEL NUTRITION STATUS      Incontinent, Ileostomy Diet(see dc summary)  AMBULATORY STATUS COMMUNICATION OF NEEDS Skin   Extensive Assist Verbally Surgical wounds, PU Stage and Appropriate Care(abdominal wound, gauze dressing 2x day)   PU Stage 2 Dressing: No Dressing(scrotum)                   Personal Care Assistance Level of Assistance  Bathing, Dressing Bathing Assistance: Maximum assistance    Dressing Assistance: Maximum assistance     Functional Limitations Info             SPECIAL CARE FACTORS FREQUENCY  PT (By licensed PT), OT (By licensed OT)     PT Frequency: 5 days per week OT Frequency: 5 days per week            Contractures      Additional Factors Info  Code Status, Allergies, Insulin Sliding Scale Code Status Info: DNR Allergies Info: NKA   Insulin Sliding Scale Info: 3 times per day       Current Medications (08/23/2017):  This is the current hospital active medication list Current Facility-Administered Medications  Medication Dose Route Frequency Provider Last Rate Last Dose  . acetaminophen (TYLENOL) tablet 650 mg  650 mg Oral Q6H PRN Coralyn Helling, MD      . chlorhexidine (PERIDEX) 0.12 % solution 15 mL  15 mL Mouth Rinse BID Kalman Shan, MD   15 mL at 08/23/17 0810  . Chlorhexidine Gluconate Cloth 2 % PADS 6 each  6 each Topical Daily Kalman Shan, MD   6 each at 08/23/17 1151  . dextrose 5 % solution   Intravenous Continuous Coralyn Helling, MD 50 mL/hr at 08/23/17 1239    . feeding supplement (BOOST / RESOURCE BREEZE) liquid 1 Container  1 Container Oral BID BM Gaynelle Adu, MD   1 Container at 08/20/17 1442  . feeding supplement (ENSURE ENLIVE) (ENSURE  ENLIVE) liquid 237 mL  237 mL Oral TID BM Leslye PeerByrum, Robert S, MD   237 mL at 08/23/17 1344  . heparin injection 5,000 Units  5,000 Units Subcutaneous Q8H Barnetta Chapelsborne, Kelly, PA-C   5,000 Units at 08/23/17 1342  . hydrALAZINE (APRESOLINE) injection 20 mg  20 mg Intravenous Q4H Bensimhon, Bevelyn Bucklesaniel R, MD   20 mg at 08/23/17 1150  . insulin aspart (novoLOG) injection 0-9 Units  0-9 Units Subcutaneous Q8H Gerilyn NestleDang, Thuy D, [place]PH   1 Units at 08/22/17 0056  . iopamidol (ISOVUE-300) 61 % injection 15 mL  15 mL Oral BID PRN Kalman Shanamaswamy, Murali, MD   15 mL at 08/22/17 0930  . labetalol (NORMODYNE,TRANDATE) injection 10-20 mg  10-20 mg Intravenous Q2H PRN Shane Crutchamachandran, Pradeep, MD   10 mg at 08/22/17 0343  . lip  balm (BLISTEX) ointment   Topical PRN Kalman Shanamaswamy, Murali, MD      . LORazepam (ATIVAN) injection 0.5 mg  0.5 mg Intravenous Q12H PRN Gaynelle AduWilson, Eric, MD   0.5 mg at 08/22/17 2015  . MEDLINE mouth rinse  15 mL Mouth Rinse q12n4p Kalman Shanamaswamy, Murali, MD   15 mL at 08/23/17 1152  . morphine 4 MG/ML injection 2-4 mg  2-4 mg Intravenous Q3H PRN Deterding, Dorise HissElizabeth C, MD   2 mg at 08/22/17 2319  . nitroGLYCERIN (NITROSTAT) SL tablet 0.4 mg  0.4 mg Sublingual Q5 min PRN Deterding, Dorise HissElizabeth C, MD   0.4 mg at 08/13/17 0455  . ondansetron (ZOFRAN) injection 4 mg  4 mg Intravenous Q6H PRN Bensimhon, Bevelyn Bucklesaniel R, MD   4 mg at 08/21/17 0128  . pantoprazole (PROTONIX) injection 40 mg  40 mg Intravenous Q24H Coralyn HellingSood, Vineet, MD   40 mg at 08/22/17 2146  . piperacillin-tazobactam (ZOSYN) IVPB 3.375 g  3.375 g Intravenous Q8H Shade, Jacqulyn CaneChristine E, RPH   Stopped at 08/23/17 1438  . sodium chloride flush (NS) 0.9 % injection 10-40 mL  10-40 mL Intracatheter Q12H Kalman Shanamaswamy, Murali, MD   10 mL at 08/23/17 0949  . sodium chloride flush (NS) 0.9 % injection 10-40 mL  10-40 mL Intracatheter PRN Kalman Shanamaswamy, Murali, MD   10 mL at 08/09/17 1711     Discharge Medications: Please see discharge summary for a list of discharge medications.  Relevant Imaging Results:  Relevant Lab Results:   Additional Information SSN [id number]  Burna SisUris, Mixtli Reno H, [place]

## 2017-08-24 DIAGNOSIS — K651 Peritoneal abscess: Secondary | ICD-10-CM

## 2017-08-24 LAB — CBC
HCT: 23.8 % — ABNORMAL LOW (ref 39.0–52.0)
Hemoglobin: 7.3 g/dL — ABNORMAL LOW (ref 13.0–17.0)
MCH: 28.3 pg (ref 26.0–34.0)
MCHC: 30.7 g/dL (ref 30.0–36.0)
MCV: 92.2 fL (ref 78.0–100.0)
PLATELETS: 440 10*3/uL — AB (ref 150–400)
RBC: 2.58 MIL/uL — AB (ref 4.22–5.81)
RDW: 16.7 % — ABNORMAL HIGH (ref 11.5–15.5)
WBC: 10.1 10*3/uL (ref 4.0–10.5)

## 2017-08-24 LAB — GLUCOSE, CAPILLARY
GLUCOSE-CAPILLARY: 119 mg/dL — AB (ref 65–99)
Glucose-Capillary: 97 mg/dL (ref 65–99)
Glucose-Capillary: 144 mg/dL — ABNORMAL HIGH (ref 65–99)

## 2017-08-24 LAB — BASIC METABOLIC PANEL
ANION GAP: 9 (ref 5–15)
BUN: 34 mg/dL — ABNORMAL HIGH (ref 6–20)
CO2: 24 mmol/L (ref 22–32)
Calcium: 8.2 mg/dL — ABNORMAL LOW (ref 8.9–10.3)
Chloride: 106 mmol/L (ref 101–111)
Creatinine, Ser: 2.12 mg/dL — ABNORMAL HIGH (ref 0.61–1.24)
GFR, EST AFRICAN AMERICAN: 36 mL/min — AB (ref >60)
GFR, EST NON AFRICAN AMERICAN: 31 mL/min — AB (ref >60)
Glucose, Bld: 97 mg/dL (ref 65–99)
Potassium: 3.6 mmol/L (ref 3.5–5.1)
SODIUM: 139 mmol/L (ref 135–145)

## 2017-08-24 MED ORDER — POTASSIUM CHLORIDE 10 MEQ/50ML IV SOLN
10.0000 meq | INTRAVENOUS | Status: AC
  Administered 2017-08-24 (×3): 10 meq via INTRAVENOUS
  Filled 2017-08-24 (×3): qty 50

## 2017-08-24 NOTE — Progress Notes (Addendum)
Electrophysiology Rounding Note  Patient Name: Caleb Singh Date of Encounter: 08/24/2017  Primary Cardiologist: Antoine Poche (new this admission)   Subjective   Denies chest pain or shortness of breath.  Eating more solid foods.    Inpatient Medications    Scheduled Meds: . chlorhexidine  15 mL Mouth Rinse BID  . Chlorhexidine Gluconate Cloth  6 each Topical Daily  . feeding supplement  1 Container Oral BID BM  . feeding supplement (ENSURE ENLIVE)  237 mL Oral TID BM  . heparin  5,000 Units Subcutaneous Q8H  . hydrALAZINE  20 mg Intravenous Q4H  . insulin aspart  0-9 Units Subcutaneous Q8H  . mouth rinse  15 mL Mouth Rinse q12n4p  . pantoprazole (PROTONIX) IV  40 mg Intravenous Q24H  . sodium chloride flush  10-40 mL Intracatheter Q12H   Continuous Infusions:  PRN Meds: acetaminophen, iopamidol, labetalol, lip balm, LORazepam, morphine injection, nitroGLYCERIN, ondansetron (ZOFRAN) IV, sodium chloride flush   Vital Signs    Vitals:   08/24/17 1100 08/24/17 1154 08/24/17 1200 08/24/17 1300  BP: (!) 127/109  (!) 142/95 130/74  Pulse: (!) 117  (!) 106 (!) 103  Resp: (!) 32  18 (!) 26  Temp:  (!) 97.4 F (36.3 C)    TempSrc:  Oral    SpO2: 98%  99% 99%  Weight:      Height:        Intake/Output Summary (Last 24 hours) at 08/24/2017 1431 Last data filed at 08/24/2017 1200 Gross per 24 hour  Intake 2000 ml  Output 2001 ml  Net -1 ml   Filed Weights   08/22/17 0400 08/23/17 0409 08/24/17 0600  Weight: 168 lb 12.8 oz (76.6 kg) 161 lb 2.5 oz (73.1 kg) 151 lb 7.3 oz (68.7 kg)    Physical Exam    Sickly cachectic African-American male in no acute distress HENT normal Neck supple with pacemaker right IJ  Clear laterally Regular rate and rhythm, no murmurs or gallops Abd-soft with active BS No Clubbing cyanosis edema Skin-warm and dry A & Oriented  Grossly normal sensory and motor function   Labs    CBC Recent Labs    08/23/17 0401 08/24/17 0437  WBC  11.8* 10.1  HGB 7.3* 7.3*  HCT 23.1* 23.8*  MCV 92.4 92.2  PLT 441* 440*   Basic Metabolic Panel Recent Labs    16/10/96 0400 08/23/17 0401 08/24/17 0437  NA 132* 137 139  K 3.7 3.6 3.6  CL 101 107 106  CO2 22 23 24   GLUCOSE 295* 198* 97  BUN 38* 37* 34*  CREATININE 1.97* 2.06* 2.12*  CALCIUM 7.7* 8.0* 8.2*  MG 1.9  --   --   PHOS 3.7  --   --    Liver Function Tests Recent Labs    08/22/17 0400  AST 112*  ALT 231*  ALKPHOS 207*  BILITOT 1.3*  PROT 7.1  ALBUMIN 1.7*   Fasting Lipid Panel No results for input(s): CHOL, HDL, LDLCALC, TRIG, CHOLHDL, LDLDIRECT in the last 72 hours.   Telemetry    Telemetry personally reviewed.  1 pause about 8 AM.  Patient was sleeping at the time. Radiology    No results found.   Assessment & Plan     Sinus pauses seemingly vagal.    Sinus tachycardia  Anemia  Hypoalbuminemia.     Pauses seem to be hyper vagotonia and likely related to sleep apnea.  We will remove temporary pacemaker.   Tachycardia has  multiple possible explanations including anemia hypoalbuminemia.  We will follow for now.  Would not treat   Follow with you.

## 2017-08-24 NOTE — Plan of Care (Signed)
Pt done with antibiotics today

## 2017-08-24 NOTE — Progress Notes (Signed)
PULMONARY / CRITICAL CARE MEDICINE   Name: Caleb Singh MRN: [id number] DOB: 07-28-1956    ADMISSION DATE:  07/23/2017 CONSULTATION DATE:  07/23/17  REFERRING MD:  Dr. Andrey CampanileWilson, CCS  CHIEF COMPLAINT:  Abdominal Pain   BRIEF SUMMARY: 62 yo male former smoker presented with severe abdominal pain and found to have free air in abdomen from perforated SB and ischemic cecum.  S/p resection of terminal ileum.  Required re-look for drainage of abdominal abscesses and evacuation of pelvic hematoma.  SUBJECTIVE:  RN reports pacer turned off yesterday.  No pacing overnight.  Pt indicates he is still sleepy.  No acute events.  3 drains removed yesterday.  VITAL SIGNS: BP 127/77   Pulse 96   Temp 98.1 F (36.7 C) (Oral)   Resp 20   Ht 5\' 10"  (1.778 m)   Wt 151 lb 7.3 oz (68.7 kg)   SpO2 97%   BMI 21.73 kg/m   HEMODYNAMICS:    VENTILATOR SETTINGS:    INTAKE / OUTPUT: I/O last 3 completed shifts: In: 3047 [P.O.:977; I.V.:1900; Other:20; IV Piggyback:150] Out: 3711 [Urine:2276; Drains:35; Stool:1400]  PHYSICAL EXAMINATION: General: chronically ill appearing male in NAD HEENT: MM pink/moist, R IJ cordis  PSY: calm  Neuro: AAOx4, speech clear, MAE  CV: s1s2 rrr, no m/r/g, regular / no pacing noted  PULM: even/non-labored, lungs bilaterally clear GI: soft, midline dressing intact, ileostomy with green liquid drainage, R drain x1  Extremities: warm/dry, no edema  Skin: no rashes or lesions  LABS:  BMET Recent Labs  Lab 08/22/17 0400 08/23/17 0401 08/24/17 0437  NA 132* 137 139  K 3.7 3.6 3.6  CL 101 107 106  CO2 22 23 24   BUN 38* 37* 34*  CREATININE 1.97* 2.06* 2.12*  GLUCOSE 295* 198* 97    Electrolytes Recent Labs  Lab 08/19/17 0805 08/20/17 0543 08/22/17 0400 08/23/17 0401 08/24/17 0437  CALCIUM 8.1* 8.0* 7.7* 8.0* 8.2*  MG 2.1 2.0 1.9  --   --   PHOS 3.1  --  3.7  --   --     CBC Recent Labs  Lab 08/22/17 0400 08/23/17 0401 08/24/17 0437  WBC  14.9* 11.8* 10.1  HGB 7.1* 7.3* 7.3*  HCT 24.1* 23.1* 23.8*  PLT 441* 441* 440*    Coag's No results for input(s): APTT, INR in the last 168 hours.  Sepsis Markers No results for input(s): LATICACIDVEN, PROCALCITON, O2SATVEN in the last 168 hours.  ABG Recent Labs  Lab 08/20/17 1254  PHART 7.459*  PCO2ART 32.5  PO2ART 92.0    Liver Enzymes Recent Labs  Lab 08/19/17 0805 08/21/17 0348 08/22/17 0400  AST 486* 186* 112*  ALT 375* 290* 231*  ALKPHOS 152* 189* 207*  BILITOT 1.0 1.2 1.3*  ALBUMIN 1.6* 1.7* 1.7*    Cardiac Enzymes No results for input(s): TROPONINI, PROBNP in the last 168 hours.  Glucose Recent Labs  Lab 08/22/17 1147 08/22/17 1520 08/22/17 2316 08/23/17 0755 08/23/17 1609 08/23/17 2329  GLUCAP 101* 98 103* 94 141* 95    Imaging No results found.   STUDIES:  CT abd/pelvis 12/04 >> free air, SB pneumatosis, b/l inguinal hernias, b/l renal cysts Echo 12/05 >> EF 45 to 50%, mild LVH, grade 1 DD CT abd/pelvis 12/12 >> extraluminal contrast, ascites, abscess Lt hepatic lobe area, RLL consolidation, b/l effusions CT chest/abd/pelvis 12/19 >> pelvic hematoma liquefaction, perisplenic abscess, atelectasis LHC 12/25 > normal coronaries, TV pacer placed with backup rate of 40bpm. CT A/P 12/27 > multiple  complicated fluid collections parasplenic and perihepatic fluid collections stable/decreased though now with multiple large abdominal/pelvic collections CT A/P 1/3 >> overall significant improvement in fluid collections   CULTURES: Blood 12/04: E coli Blood 12/10: negative Surgical wound 12/12: Pan sensitive E. Coli Surgical drain 12/20: E coli Abdominal RLQ Abscess 12/29 >> negative  ANTIBIOTICS: Vancomycin 12/04 >> 12/04 Zosyn 12/04 >> 12/13 Anidulafungin 12/04 >> 12/17 Meropenem 12/14 >> 12/17 Zosyn 12/17 >>1/5 Anidulafungin 12/19 >> 12/24 Vanco 12/19 >>12/24  SIGNIFICANT EVENTS: 12/04  Admit 12/05  Start CRRT 12/06  Ileostomy,  closure of abdominal wound 12/08  Off pressors 12/09  Off CRRT 12/12  Laparotomy for abscess, evacuation of hematoma  12/16  Off precedex  12/18  Bicarb added for NG acidosis, tx 1 unit PRBC 12/19  Decompensated, PEA arrest > intubated 12/20  IR drainage of LUQ abscess 12/25  Transferred to [facility] for LHC and temp venous pacer placement 12/27  PPM set to VVI rate 30 12/29  Drain to RLQ by IR 12/31  Brady events w pacing capture at 30, last 24h. Nausea. Tolerating clears.  01/01  Tolerating liquid diet but poor intake.  TPN continues.  Bradycardia events  01/04  Pacer turned off.  3 drains removed.  01/05  No pacing events.    LINES/TUBES: Lt [place] CVL 12/04 >> 12/16 ETT 12/04 >> 12/04 RUE PICC 12/16 >> ETT 12/20 >> 12/24 R IJ temp venous pacer 12/26 >   CONSULTS 12/05 Renal, s/o 12/12 12/19 Wound care 12/20 IR  12/25 Cardiology 12/26 Palliative   DISCUSSION: 62 yo male with SB perforation, cecal ischemia complicated by sepsis with peritonitis, E coli bacteremia, multiple abdominal abscess with E coli, and acute renal failure.  Not candidate for further surgical intervention.  Transferred from [facility] to [facility] due to bradycardia and pauses requiring transvenous pacer.  Felt to be related to vagal response.  ASSESSMENT / PLAN:  PULMONARY A: Tachypnea - resolved  Acute Hypoxic Respiratory Failure - resolved  P:   Monitor / supportive care   CARDIOVASCULAR A:  Chest Pain with Bradycardia - s/p LHC and temp pacer placement 12/25. A fib with RVR. Hypertension. P:  Tele monitoring  Appreciate Cardiology / EP  DNR in the event of arrest  Hydralazine 20 mg IV Q4, consider transition to oral ?? Labetalol PRN  Likely poor candidate at this time for pacemaker due to infectious process   RENAL A:   CKD III - rising sr cr trend 1/4  P:   D5w @ 50 ml/hr Trend BMP / urinary output Replace electrolytes as indicated Avoid nephrotoxic agents, ensure adequate renal  perfusion  GASTROINTESTINAL A:   Peritonitis secondary to Small Bowel Perforation & Cecal Ischemia  Severe Protein Calorie Malnutrition  P:   Post op care, diet per CCS IR following for drains  ABX as above  HEMATOLOGIC A:   Anemia of Critical Illness  P:  Trend CBC  Heparin SQ for DVT prophylaxis   INFECTIOUS A:   Peritonitis with E-Coli (pansensitive) Bacteremia  P:   Discontinue zosyn, has been on since before 12/17 Will monitor closely off > previously decompensated when off but still had significant fluid collections at that time.   ENDOCRINE A:   Hyperglycemia    P:   SSI  NEUROLOGIC A:   Anxiety Pain  Deconditioning of critical illness P:   Ativan 0.5mg  IV Q12 PRN  PRN morphine for pain  Mobilize / PT efforts   FAMILY  - Updates:  Patient updated 1/5 am.   - Global: Transfer out once temp pacemaker out   Canary Brim, NP-C [facility] Pgr: [phone] or if no answer [phone] 08/24/2017, 9:22 AM

## 2017-08-24 NOTE — Progress Notes (Addendum)
0700 Bedside shift report, pt sleeping, easy to arouse, NAD, no complaints at this time, will continue to monitor.   0800 Pt assessed, see flow sheet. Pt falling back asleep. Fall precautions in place, WCTM.   0930 Pt sitting up in bed eating breakfast. Stated he was hungry. Good appetite noted today. Medicated per MAR, no complaints. Pt denies pain, SOB. Fall precautions in place[facility]   1045 Pt bathed, linen changed, dressing to abdomen and JP drain dsg changed. Pt shaved. Pt up to chair. Tolerated well. Chair alarm on. WCTM.   1300 Pt assisted back to bed, RN helped with lunch tray setup. Pt sitting up eating lunch. NAD, denies pain. Fall precautions in place. WCTM.   1530 Pt visiting with family at bedside, NAD.  1830 RN called to room, pt choking on [place], coughed it up, said he vomited. RN instructed pt to order soft foods.

## 2017-08-24 NOTE — Progress Notes (Signed)
11 Days Post-Op   Subjective/Chief Complaint: Wants Cheerios    Objective: Vital signs in last 24 hours: Temp:  [97.5 F (36.4 C)-98.1 F (36.7 C)] 98.1 F (36.7 C) (01/05 0318) Pulse Rate:  [87-123] 95 (01/05 0600) Resp:  [20-39] 24 (01/05 0700) BP: (114-154)/(62-94) 136/81 (01/05 0700) SpO2:  [95 %-99 %] 98 % (01/05 0600) Weight:  [68.7 kg (151 lb 7.3 oz)] 68.7 kg (151 lb 7.3 oz) (01/05 0600) Last BM Date: 08/23/17  Intake/Output from previous day: 01/04 0701 - 01/05 0700 In: 2000 [P.O.:500; I.V.:1350; IV Piggyback:150] Out: 1826 [Urine:1626; Stool:200] Intake/Output this shift: No intake/output data recorded.  Resp: clear to auscultation bilaterally Cardio: SR  Incision/Wound:fascial dehiscence stable  Clean ostomy functioning viable   Lab Results:  Recent Labs    08/23/17 0401 08/24/17 0437  WBC 11.8* 10.1  HGB 7.3* 7.3*  HCT 23.1* 23.8*  PLT 441* 440*   BMET Recent Labs    08/23/17 0401 08/24/17 0437  NA 137 139  K 3.6 3.6  CL 107 106  CO2 23 24  GLUCOSE 198* 97  BUN 37* 34*  CREATININE 2.06* 2.12*  CALCIUM 8.0* 8.2*   PT/INR No results for input(s): LABPROT, INR in the last 72 hours. ABG No results for input(s): PHART, HCO3 in the last 72 hours.  Invalid input(s): PCO2, PO2  Studies/Results: Ct Abdomen Pelvis Wo Contrast  Result Date: 08/22/2017 CLINICAL DATA:  62 year old male with a history of distal small bowel perforation status post ileo seek ectomy complicated by intra-abdominal abscess formation. Patient has a percutaneous drainage catheter in the left subdiaphragmatic space, a percutaneous drainage catheter in the right lower quadrant as well as 2 surgically placed drains in the right upper quadrant. Current output is approximately 20-30 mL per day. EXAM: CT ABDOMEN AND PELVIS WITHOUT CONTRAST TECHNIQUE: Multidetector CT imaging of the abdomen and pelvis was performed following the standard protocol without IV contrast. COMPARISON:   Multiple prior studies, most recent CT scan of the abdomen and pelvis 12/227/2018 FINDINGS: Lower chest: Improving left-sided pleural effusion. Persistent bibasilar atelectasis. Stable cardiac and visualized mediastinal contours. Unremarkable distal thoracic esophagus. Hepatobiliary: Normal hepatic contour morphology. No discrete hepatic lesion. Gallbladder distension with mild gallbladder wall thickening. No intra or extrahepatic biliary ductal dilatation. Pancreas: Unremarkable. No pancreatic ductal dilatation or surrounding inflammatory changes. Spleen: Normal in size without focal abnormality. Adrenals/Urinary Tract: Normal appearance of the adrenal glands. Limited evaluation the kidneys in the absence of intravenous contrast. No evidence of hydronephrosis or nephrolithiasis. Stable complex/hemorrhagic subcentimeter cyst in the lower pole of left kidney. Stable 2.8 cm simple cyst in the interpolar right kidney. Ureters and bladder are unremarkable. Stomach/Bowel: Surgical changes of ileocecectomy with diverting RLQ ileostomy are again evident. Persistent diffuse mild small bowel wall thickening. Several loops of small bowel are mildly dilated. No overt obstruction. Vascular/Lymphatic: Limited evaluation in the absence of intravenous contrast. No aneurysm. Reproductive: Prostate is unremarkable. Other: The left upper quadrant perisplenic drainage catheter remains in good position. No measurable residual fluid in this region. Two surgically placed drains are present in the right upper quadrant anterior and postero inferior to the liver. No significant residual measurable fluid collection about the liver. The most recently placed drainage catheter in the right lower quadrant remains in good position. There is been significant interval decrease in the volume of the high attenuation fluid collection in the right pericolic gutter. The residual fluid component measures approximately 4.7 x 1.8 cm compared to 7.2 x 4.0  cm previously. The  high attenuation collection in in the anatomic pelvis is also smaller in size presently measuring 3.9 by 4.1 cm compared to 4.5 x 4.8 cm. A high attenuation collection in the region of the ileal mesenteries is also slightly smaller in size measuring approximately 4 4.7 x 3.5 cm compared to 5.4 x 3.9 cm previously. High attenuation fluid within the left pericolic gutter extending inferiorly into the bilateral inguinal recesses is also smaller compared to prior imaging. An index pocket of the fluid measured in the left pericolic gutter at the level of the mid kidneys measures 5.6 x 2.1 cm today compared to 7.0 x 2.6 cm previously. Musculoskeletal: No acute osseous abnormality. IMPRESSION: 1. Overall, significant interval improvement in multiple intra-abdominal fluid collections compared to 08/15/2017 as detailed below. 2. The left upper quadrant perisplenic drainage catheter remains in good position and there is no residual measurable fluid collection. 3. The 2 surgical drains in the perihepatic space remain in good position with no significant residual measurable fluid. 4. The most recently placed right lower quadrant drainage catheter remains in good position with persistent but decreasing fluid in the right pericolic gutter. 5. The undrained intermediate attenuation fluid within the left pericolic gutter, ileal mesenteries, anatomic pelvis and bilateral inguinal recesses is all decreasing in volume. 6. The gallbladder distension with mild gallbladder wall thickening is nonspecific and may be secondary to prolonged NPO status versus acalculous cholecystitis in the appropriate clinical setting. 7. Improving left-sided pleural effusion and bibasilar atelectasis. 8. Persistent diffuse mesenteric stranding and small bowel wall thickening consistent with evolving postsurgical changes and residual inflammation. Electronically Signed   By: Malachy MoanHeath  McCullough M.D.   On: 08/22/2017 13:49     Anti-infectives: Anti-infectives (From admission, onward)   Start     Dose/Rate Route Frequency Ordered Stop   08/10/17 0500  vancomycin (VANCOCIN) 1,500 mg in sodium chloride 0.9 % 500 mL IVPB  Status:  Discontinued     1,500 mg 250 mL/hr over 120 Minutes Intravenous Every 48 hours 08/08/17 0728 08/12/17 1018   08/08/17 2200  anidulafungin (ERAXIS) 100 mg in sodium chloride 0.9 % 100 mL IVPB  Status:  Discontinued     100 mg 78 mL/hr over 100 Minutes Intravenous Every 24 hours 08/08/17 0524 08/12/17 1018   08/08/17 0445  vancomycin (VANCOCIN) 1,500 mg in sodium chloride 0.9 % 500 mL IVPB     1,500 mg 250 mL/hr over 120 Minutes Intravenous  Once 08/08/17 0431 08/08/17 0659   08/08/17 0430  vancomycin (VANCOCIN) 1,250 mg in sodium chloride 0.9 % 250 mL IVPB  Status:  Discontinued     1,250 mg 166.7 mL/hr over 90 Minutes Intravenous  Once 08/08/17 0418 08/08/17 0428   08/08/17 0415  anidulafungin (ERAXIS) 200 mg in sodium chloride 0.9 % 200 mL IVPB     200 mg 78 mL/hr over 200 Minutes Intravenous  Once 08/08/17 0413 08/08/17 0816   08/05/17 1000  piperacillin-tazobactam (ZOSYN) IVPB 3.375 g     3.375 g 12.5 mL/hr over 240 Minutes Intravenous Every 8 hours 08/05/17 0946     08/02/17 1400  meropenem (MERREM) 1 g in sodium chloride 0.9 % 100 mL IVPB  Status:  Discontinued     1 g 200 mL/hr over 30 Minutes Intravenous Every 12 hours 08/02/17 1111 08/05/17 0945   07/29/17 0600  piperacillin-tazobactam (ZOSYN) IVPB 3.375 g  Status:  Discontinued     3.375 g 12.5 mL/hr over 240 Minutes Intravenous Every 8 hours 07/29/17 0518 08/02/17 1036  07/25/17 2000  vancomycin (VANCOCIN) IVPB 1000 mg/200 mL premix  Status:  Discontinued     1,000 mg 200 mL/hr over 60 Minutes Intravenous Every 48 hours 07/23/17 2042 07/24/17 0956   07/24/17 2200  piperacillin-tazobactam (ZOSYN) 3.375 g in dextrose 5 % 50 mL IVPB  Status:  Discontinued     3.375 g 100 mL/hr over 30 Minutes Intravenous Every 6 hours  07/24/17 2006 07/29/17 0518   07/24/17 2100  anidulafungin (ERAXIS) 100 mg in sodium chloride 0.9 % 100 mL IVPB  Status:  Discontinued     100 mg 78 mL/hr over 100 Minutes Intravenous Every 24 hours 07/23/17 2039 08/05/17 0945   07/24/17 0000  anidulafungin (ERAXIS) 100 mg in sodium chloride 0.9 % 100 mL IVPB  Status:  Discontinued     100 mg 78 mL/hr over 100 Minutes Intravenous Every 24 hours 07/23/17 2038 07/23/17 2039   07/23/17 2200  piperacillin-tazobactam (ZOSYN) IVPB 3.375 g  Status:  Discontinued     3.375 g 12.5 mL/hr over 240 Minutes Intravenous Every 8 hours 07/23/17 2043 07/24/17 2006   07/23/17 2100  anidulafungin (ERAXIS) 200 mg in sodium chloride 0.9 % 200 mL IVPB     200 mg 78 mL/hr over 200 Minutes Intravenous  Once 07/23/17 2038 07/24/17 0239   07/23/17 2100  vancomycin (VANCOCIN) 1,500 mg in sodium chloride 0.9 % 500 mL IVPB     1,500 mg 250 mL/hr over 120 Minutes Intravenous  Once 07/23/17 2041 07/23/17 2317   07/23/17 1515  piperacillin-tazobactam (ZOSYN) IVPB 3.375 g     3.375 g 100 mL/hr over 30 Minutes Intravenous  Once 07/23/17 1511 07/23/17 1611      Assessment/Plan: Perforated distal small boweland ischemia of cecum 1.S/Pexploratory laparotomy, small bowel resection without anastomosis, placement of open wound VAC system 07/23/17 Dr. Gaynelle Adu 2. S/p Reexploration of abdomen, ileocecectomy, creation of end ileostomy,closure of abdominal wall, 07/25/17, Dr. Almond Lint 3. Exploratory laparotomy, drainage of abdominal abscess and evacuation of pelvic hematoma, RUQ drain, and left lateral drain placement, 07/31/17, Dr. Glenna Fellows (Findings:Subdiaphragmatic and subhepatic abscesses.Large organizing pelvic hematoma. Apparent necrotic tissue left lobe of the liver.) Bacteremia - Enterobacteriaceae + E Coli, on abx per ccm, awaiting additional drain cultures Cardiac arrest - Code Blue 08/08/17  Acute renal failure-CRRT discontinued 07/27/17   Malnutrition/Deconditioning - Tadvance to soft diet, taking Ensure Anemia - stable  GI-needs continued dressing changes, wound isdehiscedbut not much more to do is certainly at risk IRdrained collection posterior to anastomosis - small volume bilious fluid; continue IV abx drainage does not appear to be bilious ZO:[id number] 07/23/17- 07/24/17,restarted 12/20 day 1,Zosyn 07/23/17- 12/14,anidulafungin 07/23/17 - 12/17,restarted 12/20 day 1,Meropenem 12/14 - 12/17 =>>Zosyn restarted 12/17 =>>anidulafunginrestarted 12/20 =>> Vancomycin restarted 12/20 =>>  WJX:BJYN - Heparin Follow-up:Dr. Gaynelle Adu   Disposition: F/U CT yesterday improved. D/C RUQ surgical JPs (2). Suspect IR will D/C LUQ drain. Continue IR placed drain R gutter (placed 12/29). That collection is getting smaller also. Therapies for deconditioning. Advance to regular diet.     LOS: 32 days    Clovis Pu Shannan Garfinkel 08/24/2017

## 2017-08-24 NOTE — Progress Notes (Signed)
eLink Physician-Brief Progress Note Patient Name: Caleb LaundryFrederick Singh DOB: 01/15/1956 MRN: [id number]   Date of Service  08/24/2017  HPI/Events of Note    eICU Interventions  Hypokalemia, repleted     Intervention Category Minor Interventions: Electrolytes abnormality - evaluation and management  Max FickleDouglas McQuaid 08/24/2017, 6:17 AM

## 2017-08-25 DIAGNOSIS — K651 Peritoneal abscess: Secondary | ICD-10-CM

## 2017-08-25 LAB — BASIC METABOLIC PANEL
ANION GAP: 5 (ref 5–15)
BUN: 30 mg/dL — ABNORMAL HIGH (ref 6–20)
CALCIUM: 8.3 mg/dL — AB (ref 8.9–10.3)
CO2: 23 mmol/L (ref 22–32)
Chloride: 112 mmol/L — ABNORMAL HIGH (ref 101–111)
Creatinine, Ser: 2.09 mg/dL — ABNORMAL HIGH (ref 0.61–1.24)
GFR, EST AFRICAN AMERICAN: 37 mL/min — AB (ref >60)
GFR, EST NON AFRICAN AMERICAN: 32 mL/min — AB (ref >60)
GLUCOSE: 92 mg/dL (ref 65–99)
Potassium: 3.9 mmol/L (ref 3.5–5.1)
SODIUM: 140 mmol/L (ref 135–145)

## 2017-08-25 LAB — CBC
HCT: 24 % — ABNORMAL LOW (ref 39.0–52.0)
Hemoglobin: 7.3 g/dL — ABNORMAL LOW (ref 13.0–17.0)
MCH: 28 pg (ref 26.0–34.0)
MCHC: 30.4 g/dL (ref 30.0–36.0)
MCV: 92 fL (ref 78.0–100.0)
PLATELETS: 478 10*3/uL — AB (ref 150–400)
RBC: 2.61 MIL/uL — ABNORMAL LOW (ref 4.22–5.81)
RDW: 16.3 % — AB (ref 11.5–15.5)
WBC: 10.3 10*3/uL (ref 4.0–10.5)

## 2017-08-25 LAB — GLUCOSE, CAPILLARY
GLUCOSE-CAPILLARY: 88 mg/dL (ref 65–99)
GLUCOSE-CAPILLARY: 91 mg/dL (ref 65–99)

## 2017-08-25 MED ORDER — MENTHOL 3 MG MT LOZG
1.0000 | LOZENGE | OROMUCOSAL | Status: DC | PRN
  Filled 2017-08-25: qty 9

## 2017-08-25 MED ORDER — HYDRALAZINE HCL 25 MG PO TABS
25.0000 mg | ORAL_TABLET | Freq: Three times a day (TID) | ORAL | Status: DC
  Administered 2017-08-25 – 2017-08-31 (×19): 25 mg via ORAL
  Filled 2017-08-25 (×20): qty 1

## 2017-08-25 MED ORDER — METRONIDAZOLE 500 MG PO TABS
500.0000 mg | ORAL_TABLET | Freq: Two times a day (BID) | ORAL | Status: DC
  Administered 2017-08-25 – 2017-08-31 (×13): 500 mg via ORAL
  Filled 2017-08-25 (×13): qty 1

## 2017-08-25 MED ORDER — CIPROFLOXACIN HCL 500 MG PO TABS
500.0000 mg | ORAL_TABLET | Freq: Two times a day (BID) | ORAL | Status: DC
  Administered 2017-08-25 – 2017-08-31 (×13): 500 mg via ORAL
  Filled 2017-08-25 (×13): qty 1

## 2017-08-25 NOTE — Progress Notes (Signed)
12 Days Post-Op   Subjective/Chief Complaint: Mouth dry , voice not normal, tol food but not able to chew it all   Objective: Vital signs in last 24 hours: Temp:  [97.4 F (36.3 C)-98.1 F (36.7 C)] 98 F (36.7 C) (01/05 2300) Pulse Rate:  [94-119] 94 (01/06 0700) Resp:  [18-36] 23 (01/06 0700) BP: (113-147)/(66-109) 147/83 (01/06 0700) SpO2:  [96 %-100 %] 100 % (01/06 0700) Last BM Date: 08/24/17  Intake/Output from previous day: 01/05 0701 - 01/06 0700 In: 1720 [P.O.:1570; I.V.:50; IV Piggyback:100] Out: 3800 [Urine:1300; Stool:2500] Intake/Output this shift: No intake/output data recorded.  Resp: clear to auscultation bilaterally Cv: rrr Incision/Wound:fascial dehiscence stable  with some nonviable tissue, one drain in place with some bloody output, Clean ostomy functioning viable     Lab Results:  Recent Labs    08/24/17 0437 08/25/17 0340  WBC 10.1 10.3  HGB 7.3* 7.3*  HCT 23.8* 24.0*  PLT 440* 478*   BMET Recent Labs    08/24/17 0437 08/25/17 0340  NA 139 140  K 3.6 3.9  CL 106 112*  CO2 24 23  GLUCOSE 97 92  BUN 34* 30*  CREATININE 2.12* 2.09*  CALCIUM 8.2* 8.3*   PT/INR No results for input(s): LABPROT, INR in the last 72 hours. ABG No results for input(s): PHART, HCO3 in the last 72 hours.  Invalid input(s): PCO2, PO2  Studies/Results: No results found.  Anti-infectives: Anti-infectives (From admission, onward)   Start     Dose/Rate Route Frequency Ordered Stop   08/10/17 0500  vancomycin (VANCOCIN) 1,500 mg in sodium chloride 0.9 % 500 mL IVPB  Status:  Discontinued     1,500 mg 250 mL/hr over 120 Minutes Intravenous Every 48 hours 08/08/17 0728 08/12/17 1018   08/08/17 2200  anidulafungin (ERAXIS) 100 mg in sodium chloride 0.9 % 100 mL IVPB  Status:  Discontinued     100 mg 78 mL/hr over 100 Minutes Intravenous Every 24 hours 08/08/17 0524 08/12/17 1018   08/08/17 0445  vancomycin (VANCOCIN) 1,500 mg in sodium chloride 0.9 % 500 mL  IVPB     1,500 mg 250 mL/hr over 120 Minutes Intravenous  Once 08/08/17 0431 08/08/17 0659   08/08/17 0430  vancomycin (VANCOCIN) 1,250 mg in sodium chloride 0.9 % 250 mL IVPB  Status:  Discontinued     1,250 mg 166.7 mL/hr over 90 Minutes Intravenous  Once 08/08/17 0418 08/08/17 0428   08/08/17 0415  anidulafungin (ERAXIS) 200 mg in sodium chloride 0.9 % 200 mL IVPB     200 mg 78 mL/hr over 200 Minutes Intravenous  Once 08/08/17 0413 08/08/17 0816   08/05/17 1000  piperacillin-tazobactam (ZOSYN) IVPB 3.375 g  Status:  Discontinued     3.375 g 12.5 mL/hr over 240 Minutes Intravenous Every 8 hours 08/05/17 0946 08/24/17 0945   08/02/17 1400  meropenem (MERREM) 1 g in sodium chloride 0.9 % 100 mL IVPB  Status:  Discontinued     1 g 200 mL/hr over 30 Minutes Intravenous Every 12 hours 08/02/17 1111 08/05/17 0945   07/29/17 0600  piperacillin-tazobactam (ZOSYN) IVPB 3.375 g  Status:  Discontinued     3.375 g 12.5 mL/hr over 240 Minutes Intravenous Every 8 hours 07/29/17 0518 08/02/17 1036   07/25/17 2000  vancomycin (VANCOCIN) IVPB 1000 mg/200 mL premix  Status:  Discontinued     1,000 mg 200 mL/hr over 60 Minutes Intravenous Every 48 hours 07/23/17 2042 07/24/17 0956   07/24/17 2200  piperacillin-tazobactam (ZOSYN)  3.375 g in dextrose 5 % 50 mL IVPB  Status:  Discontinued     3.375 g 100 mL/hr over 30 Minutes Intravenous Every 6 hours 07/24/17 2006 07/29/17 0518   07/24/17 2100  anidulafungin (ERAXIS) 100 mg in sodium chloride 0.9 % 100 mL IVPB  Status:  Discontinued     100 mg 78 mL/hr over 100 Minutes Intravenous Every 24 hours 07/23/17 2039 08/05/17 0945   07/24/17 0000  anidulafungin (ERAXIS) 100 mg in sodium chloride 0.9 % 100 mL IVPB  Status:  Discontinued     100 mg 78 mL/hr over 100 Minutes Intravenous Every 24 hours 07/23/17 2038 07/23/17 2039   07/23/17 2200  piperacillin-tazobactam (ZOSYN) IVPB 3.375 g  Status:  Discontinued     3.375 g 12.5 mL/hr over 240 Minutes Intravenous  Every 8 hours 07/23/17 2043 07/24/17 2006   07/23/17 2100  anidulafungin (ERAXIS) 200 mg in sodium chloride 0.9 % 200 mL IVPB     200 mg 78 mL/hr over 200 Minutes Intravenous  Once 07/23/17 2038 07/24/17 0239   07/23/17 2100  vancomycin (VANCOCIN) 1,500 mg in sodium chloride 0.9 % 500 mL IVPB     1,500 mg 250 mL/hr over 120 Minutes Intravenous  Once 07/23/17 2041 07/23/17 2317   07/23/17 1515  piperacillin-tazobactam (ZOSYN) IVPB 3.375 g     3.375 g 100 mL/hr over 30 Minutes Intravenous  Once 07/23/17 1511 07/23/17 1611      Assessment/Plan: Perforated distal small boweland ischemia of cecum 1.S/Pexploratory laparotomy, small bowel resection without anastomosis, placement of open wound VAC system 07/23/17 Dr. Gaynelle Adu 2. S/p Reexploration of abdomen, ileocecectomy, creation of end ileostomy,closure of abdominal wall, 07/25/17, Dr. Almond Lint 3. Exploratory laparotomy, drainage of abdominal abscess and evacuation of pelvic hematoma, RUQ drain, and left lateral drain placement, 07/31/17, Dr. Glenna Fellows (Findings:Subdiaphragmatic and subhepatic abscesses.Large organizing pelvic hematoma. Apparent necrotic tissue left lobe of the liver.) Bacteremia - Enterobacteriaceae + E Coli Cardiac arrest - Code Blue 08/08/17  Acute renal failure-CRRT discontinued 07/27/17  Malnutrition/Deconditioning - switch to soft diet as having issues with dentition, taking Ensure GI-needs continued dressing changes, wound isdehiscedbut not much more to do is certainly at risk IRdrained collection posterior to anastomosis ID: off all abx now ZOX:WRUE - Heparin Follow-up:Dr. Ardith Dark 08/25/2017

## 2017-08-25 NOTE — Progress Notes (Signed)
Antibiotic course discussed with ID. Patient has had a complex course but overall improved.  Zosyn stopped 1/5.  Will continue oral antibiotics for 5-7 days post removal of last drain / pending patient course.     Plan: Cipro 500 mg BID Flagyl 500 mg BID  Plan to continue for 5-7 days abx post last drain removal (assuming continues to improve) Ok for pharmacy to dose abx based on renal function if needed  [name], NP-C [facility] Pgr: [phone] or if no answer [phone] 08/25/2017, 11:56 AM

## 2017-08-25 NOTE — Plan of Care (Signed)
Temporary pacer has been removed, patient is doing well with only an occasional pause occurring. He is currently on room air and showing no negative signs, no complications. Appetite is slightly improved but patient complains that the food is sometimes hard to swallow as his throat is very dry. He has been provided with cepacol lozenges that do help.

## 2017-08-25 NOTE — Progress Notes (Signed)
   Electrophysiology Rounding Note  Patient Name: Caleb LaundryFrederick Jarnigan Date of Encounter: 08/25/2017  Primary Cardiologist: Antoine PocheHochrein (new this admission)   Subjective  No chest pain or shortness of breath Pause this am with nurse present and withoug sleep or nausea     Inpatient Medications    Scheduled Meds: . chlorhexidine  15 mL Mouth Rinse BID  . Chlorhexidine Gluconate Cloth  6 each Topical Daily  . feeding supplement  1 Container Oral BID BM  . feeding supplement (ENSURE ENLIVE)  237 mL Oral TID BM  . heparin  5,000 Units Subcutaneous Q8H  . hydrALAZINE  25 mg Oral Q8H  . insulin aspart  0-9 Units Subcutaneous Q8H  . mouth rinse  15 mL Mouth Rinse q12n4p  . pantoprazole (PROTONIX) IV  40 mg Intravenous Q24H  . sodium chloride flush  10-40 mL Intracatheter Q12H   Continuous Infusions:  PRN Meds: acetaminophen, iopamidol, labetalol, lip balm, LORazepam, menthol-cetylpyridinium, morphine injection, nitroGLYCERIN, ondansetron (ZOFRAN) IV, sodium chloride flush   Vital Signs    Vitals:   08/25/17 0700 08/25/17 0800 08/25/17 0820 08/25/17 0900  BP: (!) 147/83 138/86  139/82  Pulse: 94 100  (!) 126  Resp: (!) 23 (!) 28  (!) 25  Temp:   97.9 F (36.6 C)   TempSrc:   Oral   SpO2: 100% 97%  100%  Weight:      Height:        Intake/Output Summary (Last 24 hours) at 08/25/2017 1053 Last data filed at 08/25/2017 0900 Gross per 24 hour  Intake 1330 ml  Output 4250 ml  Net -2920 ml   Filed Weights   08/22/17 0400 08/23/17 0409 08/24/17 0600  Weight: 168 lb 12.8 oz (76.6 kg) 161 lb 2.5 oz (73.1 kg) 151 lb 7.3 oz (68.7 kg)    Physical Exam    Frail and cachectic  Ill-appearing  male in no acute distress HENT normal Neck supple   Clear Regular rate and rhythm, no murmurs or gallops Abd-soft with active BS No Clubbing cyanosis edema Skin-warm and dry A & Oriented  Grossly normal sensory and motor function   Labs    CBC Recent Labs    08/24/17 0437  08/25/17 0340  WBC 10.1 10.3  HGB 7.3* 7.3*  HCT 23.8* 24.0*  MCV 92.2 92.0  PLT 440* 478*   Basic Metabolic Panel Recent Labs    16/05/9600/05/19 0437 08/25/17 0340  NA 139 140  K 3.6 3.9  CL 106 112*  CO2 24 23  GLUCOSE 97 92  BUN 34* 30*  CREATININE 2.12* 2.09*  CALCIUM 8.2* 8.3*   Liver Function Tests No results for input(s): AST, ALT, ALKPHOS, BILITOT, PROT, ALBUMIN in the last 72 hours. Fasting Lipid Panel No results for input(s): CHOL, HDL, LDLCALC, TRIG, CHOLHDL, LDLDIRECT in the last 72 hours.   Telemetry    Telemetry personally reviewed.  1 pause about 8 AM.  Patient was sleeping at the time. Radiology    No results found.   Assessment & Plan    Sinus pauses seemingly vagal.    Sinus tachycardia  Anemia  Hypoalbuminemia.  Hypertension  Cardiomyopathy  Mild    Pauses continue, still appear hypervagotonia with PP prolongation  No symptoms  Continue to follow   Will repeat echo to reassess LVEF as would inform choice of meds for BP now that he is improving

## 2017-08-25 NOTE — Progress Notes (Signed)
PULMONARY / CRITICAL CARE MEDICINE   Name: Caleb Singh MRN: [id number] DOB: [birth year]    ADMISSION DATE:  07/23/2017 CONSULTATION DATE:  07/23/17  REFERRING MD:  Dr. Andrey Campanile, CCS  CHIEF COMPLAINT:  Abdominal Pain   BRIEF SUMMARY: 62 yo male former smoker presented with severe abdominal pain and found to have free air in abdomen from perforated SB and ischemic cecum.  S/p resection of terminal ileum.  Required re-look for drainage of abdominal abscesses and evacuation of pelvic hematoma.  SUBJECTIVE:  Pacer removed 1/5.  No acute events reported by RN.  Rhythm has been stable.  Patient reports dry mouth.    VITAL SIGNS: BP 139/82   Pulse (!) 126   Temp 97.9 F (36.6 C) (Oral)   Resp (!) 25   Ht 5\' 10"  (1.778 m)   Wt 151 lb 7.3 oz (68.7 kg)   SpO2 100%   BMI 21.73 kg/m   HEMODYNAMICS:    VENTILATOR SETTINGS:    INTAKE / OUTPUT: I/O last 3 completed shifts: In: 2320 [P.O.:1570; I.V.:650; IV Piggyback:100] Out: 4851 [Urine:2151; Stool:2700]  PHYSICAL EXAMINATION: General: chronically ill appearing male in NAD lying in bed HEENT: MM pink/moist, no jvd  PSY: calm/appropriate  Neuro: AAOx4, speech clear, MAE  CV: s1s2 rrr, no m/r/g PULM: even/non-labored, lungs bilaterally clear  WU:JWJX, non-tender, bsx4 active  Extremities: warm/dry, no edema  Skin: no rashes or lesions  LABS:  BMET Recent Labs  Lab 08/23/17 0401 08/24/17 0437 08/25/17 0340  NA 137 139 140  K 3.6 3.6 3.9  CL 107 106 112*  CO2 23 24 23   BUN 37* 34* 30*  CREATININE 2.06* 2.12* 2.09*  GLUCOSE 198* 97 92    Electrolytes Recent Labs  Lab 08/19/17 0805 08/20/17 0543 08/22/17 0400 08/23/17 0401 08/24/17 0437 08/25/17 0340  CALCIUM 8.1* 8.0* 7.7* 8.0* 8.2* 8.3*  MG 2.1 2.0 1.9  --   --   --   PHOS 3.1  --  3.7  --   --   --     CBC Recent Labs  Lab 08/23/17 0401 08/24/17 0437 08/25/17 0340  WBC 11.8* 10.1 10.3  HGB 7.3* 7.3* 7.3*  HCT 23.1* 23.8* 24.0*  PLT 441* 440*  478*    Coag's No results for input(s): APTT, INR in the last 168 hours.  Sepsis Markers No results for input(s): LATICACIDVEN, PROCALCITON, O2SATVEN in the last 168 hours.  ABG Recent Labs  Lab 08/20/17 1254  PHART 7.459*  PCO2ART 32.5  PO2ART 92.0    Liver Enzymes Recent Labs  Lab 08/19/17 0805 08/21/17 0348 08/22/17 0400  AST 486* 186* 112*  ALT 375* 290* 231*  ALKPHOS 152* 189* 207*  BILITOT 1.0 1.2 1.3*  ALBUMIN 1.6* 1.7* 1.7*    Cardiac Enzymes No results for input(s): TROPONINI, PROBNP in the last 168 hours.  Glucose Recent Labs  Lab 08/23/17 1609 08/23/17 2329 08/24/17 0816 08/24/17 1148 08/24/17 1616 08/24/17 2355  GLUCAP 141* 95 97 144* 119* 91    Imaging No results found.   STUDIES:  CT abd/pelvis 12/04 >> free air, SB pneumatosis, b/l inguinal hernias, b/l renal cysts Echo 12/05 >> EF 45 to 50%, mild LVH, grade 1 DD CT abd/pelvis 12/12 >> extraluminal contrast, ascites, abscess Lt hepatic lobe area, RLL consolidation, b/l effusions CT chest/abd/pelvis 12/19 >> pelvic hematoma liquefaction, perisplenic abscess, atelectasis LHC 12/25 > normal coronaries, TV pacer placed with backup rate of 40bpm. CT A/P 12/27 > multiple complicated fluid collections parasplenic and perihepatic fluid  collections stable/decreased though now with multiple large abdominal/pelvic collections CT A/P 1/3 >> overall significant improvement in fluid collections   CULTURES: Blood 12/04: E coli Blood 12/10: negative Surgical wound 12/12: Pan sensitive E. Coli Surgical drain 12/20: E coli Abdominal RLQ Abscess 12/29 >> negative  ANTIBIOTICS: Vancomycin 12/04 >> 12/04 Zosyn 12/04 >> 12/13 Anidulafungin 12/04 >> 12/17 Meropenem 12/14 >> 12/17 Zosyn 12/17 >>1/5 Anidulafungin 12/19 >> 12/24 Vanco 12/19 >>12/24  SIGNIFICANT EVENTS: 12/04  Admit 12/05  Start CRRT 12/06  Ileostomy, closure of abdominal wound 12/08  Off pressors 12/09  Off CRRT 12/12   Laparotomy for abscess, evacuation of hematoma  12/16  Off precedex  12/18  Bicarb added for NG acidosis, tx 1 unit PRBC 12/19  Decompensated, PEA arrest > intubated 12/20  IR drainage of LUQ abscess 12/25  Transferred to [facility] for LHC and temp venous pacer placement 12/27  PPM set to VVI rate 30 12/29  Drain to RLQ by IR 12/31  Brady events w pacing capture at 30, last 24h. Nausea. Tolerating clears.  01/01  Tolerating liquid diet but poor intake.  TPN continues.  Bradycardia events  01/04  Pacer turned off.  3 drains removed.  01/05  No pacing events.    LINES/TUBES: Lt [place] CVL 12/04 >> 12/16 ETT 12/04 >> 12/04 RUE PICC 12/16 >> ETT 12/20 >> 12/24 R IJ temp venous pacer 12/26 > 1/5  CONSULTS 12/05 Renal, s/o 12/12 12/19 Wound care 12/20 IR  12/25 Cardiology 12/26 Palliative  DISCUSSION: 62 yo male with SB perforation, cecal ischemia complicated by sepsis with peritonitis, E coli bacteremia, multiple abdominal abscess with E coli, and acute renal failure.  Not candidate for further surgical intervention.  Transferred from [facility] to [facility] due to bradycardia and pauses requiring transvenous pacer.  Felt to be related to vagal response.  Temp pacer removed 1/5.    ASSESSMENT / PLAN:  PULMONARY A: Tachypnea - resolved  Acute Hypoxic Respiratory Failure - resolved  P:   Monitor / supportive care  CARDIOVASCULAR A:  Chest Pain with Bradycardia - s/p LHC and temp pacer placement 12/25. A fib with RVR. Hypertension. P:  Tele monitoring  Cardiology (EP) following, appreciate input  DNR in the event of arrest  Hydralazine changed from IV to PO 25 mg Q8 1/6  RENAL A:   CKD III  Hyperchloremia - note rising trend 1/6 P:   D5w@50  ml/hr  Trend BMP / urinary output Replace electrolytes as indicated Avoid nephrotoxic agents, ensure adequate renal perfusion  GASTROINTESTINAL A:   Peritonitis secondary to Small Bowel Perforation & Cecal Ischemia  Severe Protein Calorie  Malnutrition  P:   Post op care, diet per CCS Push nutrition as able Appreciate IR  Monitor off abx (stopped 1/5)  HEMATOLOGIC A:   Anemia of Critical Illness  P:  Trend CBC  Heparin SQ for DVT prophylaxis   INFECTIOUS A:   Peritonitis with E-Coli (pansensitive) Bacteremia  P:   Monitor off zosyn  Trend fever curve / WBC   ENDOCRINE A:   Hyperglycemia    P:   SSI  NEUROLOGIC A:   Anxiety Pain  Deconditioning of critical illness P:   PRN morphine for pain (has not needed) Mobilize / PT efforts   FAMILY  - Updates:  Patient updated on plan of care 1/6.   - Global: Transfer to medical floor, TRH as of 1/7 am.    Canary Brim, NP-C [facility] Pgr: [phone] or if  no answer [phone] 08/25/2017, 9:36 AM

## 2017-08-26 ENCOUNTER — Inpatient Hospital Stay (HOSPITAL_COMMUNITY): Payer: Non-veteran care

## 2017-08-26 DIAGNOSIS — J9601 Acute respiratory failure with hypoxia: Secondary | ICD-10-CM

## 2017-08-26 LAB — CBC
HCT: 25.9 % — ABNORMAL LOW (ref 39.0–52.0)
Hemoglobin: 7.8 g/dL — ABNORMAL LOW (ref 13.0–17.0)
MCH: 27.9 pg (ref 26.0–34.0)
MCHC: 30.1 g/dL (ref 30.0–36.0)
MCV: 92.5 fL (ref 78.0–100.0)
PLATELETS: 505 10*3/uL — AB (ref 150–400)
RBC: 2.8 MIL/uL — ABNORMAL LOW (ref 4.22–5.81)
RDW: 16.3 % — AB (ref 11.5–15.5)
WBC: 10.5 10*3/uL (ref 4.0–10.5)

## 2017-08-26 LAB — BASIC METABOLIC PANEL
Anion gap: 7 (ref 5–15)
BUN: 29 mg/dL — AB (ref 6–20)
CHLORIDE: 112 mmol/L — AB (ref 101–111)
CO2: 22 mmol/L (ref 22–32)
CREATININE: 1.91 mg/dL — AB (ref 0.61–1.24)
Calcium: 8.4 mg/dL — ABNORMAL LOW (ref 8.9–10.3)
GFR calc Af Amer: 41 mL/min — ABNORMAL LOW (ref >60)
GFR calc non Af Amer: 36 mL/min — ABNORMAL LOW (ref >60)
Glucose, Bld: 101 mg/dL — ABNORMAL HIGH (ref 65–99)
Potassium: 4 mmol/L (ref 3.5–5.1)
SODIUM: 141 mmol/L (ref 135–145)

## 2017-08-26 LAB — ECHOCARDIOGRAM COMPLETE
Height: 70 in
WEIGHTICAEL: 2299.84 [oz_av]

## 2017-08-26 LAB — HEMOGLOBIN A1C
Hgb A1c MFr Bld: 5.3 % (ref 4.8–5.6)
Mean Plasma Glucose: 105.41 mg/dL

## 2017-08-26 MED ORDER — AMLODIPINE BESYLATE 5 MG PO TABS
5.0000 mg | ORAL_TABLET | Freq: Every day | ORAL | Status: DC
  Administered 2017-08-26 – 2017-08-31 (×6): 5 mg via ORAL
  Filled 2017-08-26 (×6): qty 1

## 2017-08-26 NOTE — Progress Notes (Signed)
13 Days Post-Op   Subjective/Chief Complaint: Still not doing well with food because of poor dentition Drinking supplements   Objective: Vital signs in last 24 hours: Temp:  [97.5 F (36.4 C)-98.3 F (36.8 C)] 98.3 F (36.8 C) (01/07 0737) Pulse Rate:  [84-169] 102 (01/07 0700) Resp:  [21-33] 29 (01/07 0700) BP: (127-165)/(74-101) 143/101 (01/07 0606) SpO2:  [82 %-100 %] 97 % (01/07 0700) Weight:  [65.2 kg (143 lb 11.8 oz)] 65.2 kg (143 lb 11.8 oz) (01/07 0500) Last BM Date: 08/25/17  Intake/Output from previous day: 01/06 0701 - 01/07 0700 In: 1080 [P.O.:1080] Out: 3370 [Urine:1615; Drains:5; Stool:1750] Intake/Output this shift: No intake/output data recorded.  Resp:clear to auscultation bilaterally CV:  RRR Incision/Wound:fascial dehiscence stable with some nonviable tissue, one drain in place with some bloody output,  Clean ostomy functioning viable   Lab Results:  Recent Labs    08/25/17 0340 08/26/17 0339  WBC 10.3 10.5  HGB 7.3* 7.8*  HCT 24.0* 25.9*  PLT 478* 505*   BMET Recent Labs    08/25/17 0340 08/26/17 0339  NA 140 141  K 3.9 4.0  CL 112* 112*  CO2 23 22  GLUCOSE 92 101*  BUN 30* 29*  CREATININE 2.09* 1.91*  CALCIUM 8.3* 8.4*   PT/INR No results for input(s): LABPROT, INR in the last 72 hours. ABG No results for input(s): PHART, HCO3 in the last 72 hours.  Invalid input(s): PCO2, PO2  Studies/Results: No results found.  Anti-infectives: Anti-infectives (From admission, onward)   Start     Dose/Rate Route Frequency Ordered Stop   08/25/17 1200  ciprofloxacin (CIPRO) tablet 500 mg     500 mg Oral 2 times daily 08/25/17 1154     08/25/17 1200  metroNIDAZOLE (FLAGYL) tablet 500 mg     500 mg Oral Every 12 hours 08/25/17 1154     08/10/17 0500  vancomycin (VANCOCIN) 1,500 mg in sodium chloride 0.9 % 500 mL IVPB  Status:  Discontinued     1,500 mg 250 mL/hr over 120 Minutes Intravenous Every 48 hours 08/08/17 0728 08/12/17 1018    08/08/17 2200  anidulafungin (ERAXIS) 100 mg in sodium chloride 0.9 % 100 mL IVPB  Status:  Discontinued     100 mg 78 mL/hr over 100 Minutes Intravenous Every 24 hours 08/08/17 0524 08/12/17 1018   08/08/17 0445  vancomycin (VANCOCIN) 1,500 mg in sodium chloride 0.9 % 500 mL IVPB     1,500 mg 250 mL/hr over 120 Minutes Intravenous  Once 08/08/17 0431 08/08/17 0659   08/08/17 0430  vancomycin (VANCOCIN) 1,250 mg in sodium chloride 0.9 % 250 mL IVPB  Status:  Discontinued     1,250 mg 166.7 mL/hr over 90 Minutes Intravenous  Once 08/08/17 0418 08/08/17 0428   08/08/17 0415  anidulafungin (ERAXIS) 200 mg in sodium chloride 0.9 % 200 mL IVPB     200 mg 78 mL/hr over 200 Minutes Intravenous  Once 08/08/17 0413 08/08/17 0816   08/05/17 1000  piperacillin-tazobactam (ZOSYN) IVPB 3.375 g  Status:  Discontinued     3.375 g 12.5 mL/hr over 240 Minutes Intravenous Every 8 hours 08/05/17 0946 08/24/17 0945   08/02/17 1400  meropenem (MERREM) 1 g in sodium chloride 0.9 % 100 mL IVPB  Status:  Discontinued     1 g 200 mL/hr over 30 Minutes Intravenous Every 12 hours 08/02/17 1111 08/05/17 0945   07/29/17 0600  piperacillin-tazobactam (ZOSYN) IVPB 3.375 g  Status:  Discontinued  3.375 g 12.5 mL/hr over 240 Minutes Intravenous Every 8 hours 07/29/17 0518 08/02/17 1036   07/25/17 2000  vancomycin (VANCOCIN) IVPB 1000 mg/200 mL premix  Status:  Discontinued     1,000 mg 200 mL/hr over 60 Minutes Intravenous Every 48 hours 07/23/17 2042 07/24/17 0956   07/24/17 2200  piperacillin-tazobactam (ZOSYN) 3.375 g in dextrose 5 % 50 mL IVPB  Status:  Discontinued     3.375 g 100 mL/hr over 30 Minutes Intravenous Every 6 hours 07/24/17 2006 07/29/17 0518   07/24/17 2100  anidulafungin (ERAXIS) 100 mg in sodium chloride 0.9 % 100 mL IVPB  Status:  Discontinued     100 mg 78 mL/hr over 100 Minutes Intravenous Every 24 hours 07/23/17 2039 08/05/17 0945   07/24/17 0000  anidulafungin (ERAXIS) 100 mg in sodium  chloride 0.9 % 100 mL IVPB  Status:  Discontinued     100 mg 78 mL/hr over 100 Minutes Intravenous Every 24 hours 07/23/17 2038 07/23/17 2039   07/23/17 2200  piperacillin-tazobactam (ZOSYN) IVPB 3.375 g  Status:  Discontinued     3.375 g 12.5 mL/hr over 240 Minutes Intravenous Every 8 hours 07/23/17 2043 07/24/17 2006   07/23/17 2100  anidulafungin (ERAXIS) 200 mg in sodium chloride 0.9 % 200 mL IVPB     200 mg 78 mL/hr over 200 Minutes Intravenous  Once 07/23/17 2038 07/24/17 0239   07/23/17 2100  vancomycin (VANCOCIN) 1,500 mg in sodium chloride 0.9 % 500 mL IVPB     1,500 mg 250 mL/hr over 120 Minutes Intravenous  Once 07/23/17 2041 07/23/17 2317   07/23/17 1515  piperacillin-tazobactam (ZOSYN) IVPB 3.375 g     3.375 g 100 mL/hr over 30 Minutes Intravenous  Once 07/23/17 1511 07/23/17 1611      Assessment/Plan: Perforated distal small boweland ischemia of cecum 1.S/Pexploratory laparotomy, small bowel resection without anastomosis, placement of open wound VAC system 07/23/17 Dr. Gaynelle Adu 2. S/p Reexploration of abdomen, ileocecectomy, creation of end ileostomy,closure of abdominal wall, 07/25/17, Dr. Almond Lint 3. Exploratory laparotomy, drainage of abdominal abscess and evacuation of pelvic hematoma, RUQ drain, and left lateral drain placement, 07/31/17, Dr. Glenna Fellows (Findings:Subdiaphragmatic and subhepatic abscesses.Large organizing pelvic hematoma. Apparent necrotic tissue left lobe of the liver.) Bacteremia - Enterobacteriaceae + E Coli Cardiac arrest - Code Blue 08/08/17  Acute renal failure-CRRT discontinued 07/27/17  Malnutrition/Deconditioning - switch to soft diet as having issues with dentition, taking Ensure GI-needs continued dressing changes, wound isdehisced but seems to be scarred into place.  High risk for hernia, lower risk for evisceration IRdrained collection posterior to anastomosis ID: off all abx now ZOX:WRUE -  Heparin Follow-up:Dr. Gaynelle Adu  Working towards SNF placement soon?    LOS: 34 days    Caleb Singh 08/26/2017

## 2017-08-26 NOTE — Progress Notes (Signed)
Hospitalist progress note   Caleb Singh  ZOX:[id number] DOB: [birth year] DOA: 07/23/2017 PCP: Patient, No Pcp Per   Specialists:   Brief Narrative:  1 male smoker admit 12/4 with perf viscus-taken to Or emergently-K5.4, Creat 3.9, Lactate 10  And HR 150 on admit CRRT started on admit-had ileostomy and Ex-lap 12/12 for hematoma-had PEA arrest intubated-LHC and Temp pacer 2/2 to brady Transferred to [facility] 1/7  Assessment & Plan:   Assessment:  The primary encounter diagnosis was Perforation bowel (HCC). Diagnoses of AKI (acute kidney injury) (HCC), Elective surgery, Increased oropharyngeal secretions, Acute respiratory failure with hypoxia (HCC), Acute respiratory failure (HCC), Encounter for central line placement, Endotracheal tube present, Intestinal perforation (HCC), Respiratory failure (HCC), History of abdominal abscess, Endotracheally intubated, Postprocedural intraabdominal abscess, Encounter for nasogastric (NG) tube placement, History of temporary cardiac pacemaker treatment, and Abdominal pain were also pertinent to this visit.  Perf Abd viscus-Ex-lap-Has ileostomy and abd wound with drain-defer to gen surg wound care and follow up inst--Abx stopped 1/5 [e coli bacteremia] and restarted for 7 more days post-op Bradycardia with LHC and Temp pacing until 1/5-complicated by Afib-EP following-ON hydralazine now-further med adjustment as per them--could use low dose Torpol xl--seem to have predom sinus tach without pauses today AKI in setting sepsis-Was on CRRT until 12/8--not currently on IVF Anemia of critical illness and expected post-op losses-Hemoglobin 7.8, normocytic-start Iron when able Hyperglycemia without diag DM-monitor on soft diet, d/c checks if A1c is less than 6    DVT prophylaxis: lovenoex  Code Status:   dnr   Family Communication:   None inpatient transferring to telel  Disposition Plan: ?   Consultants:   Card  gen surg  ir  Procedures:    mulitple  Antimicrobials:    cipro 1/6  flagyl 1/6  Subjective: Awake alert didn't eat well and food too hard Periodic pains are 5/10 in abd Ambulatory No fever no chills  Objective: Vitals:   08/26/17 0600 08/26/17 0606 08/26/17 0700 08/26/17 0737  BP:  (!) 143/101    Pulse: 91 100 (!) 102   Resp: (!) 30 (!) 32 (!) 29   Temp:    98.3 F (36.8 C)  TempSrc:    Axillary  SpO2: 100% 96% 97%   Weight:      Height:        Intake/Output Summary (Last 24 hours) at 08/26/2017 0745 Last data filed at 08/26/2017 0500 Gross per 24 hour  Intake 1080 ml  Output 3370 ml  Net -2290 ml   Filed Weights   08/23/17 0409 08/24/17 0600 08/26/17 0500  Weight: 73.1 kg (161 lb 2.5 oz) 68.7 kg (151 lb 7.3 oz) 65.2 kg (143 lb 11.8 oz)    Examination:  Awake alert in nad s1 s2 tachy  abd soft end ileostomy on R side and wound in central abd Neuro intact moving 4 limbs equally   Data Reviewed: I have personally reviewed following labs and imaging studies  CBC: Recent Labs  Lab 08/19/17 0805  08/21/17 0348 08/22/17 0400 08/23/17 0401 08/24/17 0437 08/25/17 0340 08/26/17 0339  WBC  --    < > 13.8* 14.9* 11.8* 10.1 10.3 10.5  NEUTROABS 12.5*  --  9.6*  --   --   --   --   --   HGB  --    < > 8.2* 7.1* 7.3* 7.3* 7.3* 7.8*  HCT  --    < > 26.6* 24.1* 23.1* 23.8* 24.0* 25.9*  MCV  --    < >  93.7 92.3 92.4 92.2 92.0 92.5  PLT  --    < > 467* 441* 441* 440* 478* 505*   < > = values in this interval not displayed.   Basic Metabolic Panel: Recent Labs  Lab 08/19/17 0805 08/20/17 0543 08/22/17 0400 08/23/17 0401 08/24/17 0437 08/25/17 0340 08/26/17 0339  NA 134* 133* 132* 137 139 140 141  K 3.9 3.7 3.7 3.6 3.6 3.9 4.0  CL 105 104 101 107 106 112* 112*  CO2 23 23 22 23 24 23 22   GLUCOSE 384* 381* 295* 198* 97 92 101*  BUN 51* 47* 38* 37* 34* 30* 29*  CREATININE 2.00* 1.87* 1.97* 2.06* 2.12* 2.09* 1.91*  CALCIUM 8.1* 8.0* 7.7* 8.0* 8.2* 8.3* 8.4*  MG 2.1 2.0 1.9  --   --    --   --   PHOS 3.1  --  3.7  --   --   --   --    GFR: Estimated Creatinine Clearance: 36.5 mL/min (A) (by C-G formula based on SCr of 1.91 mg/dL (H)). Liver Function Tests: Recent Labs  Lab 08/19/17 0805 08/21/17 0348 08/22/17 0400  AST 486* 186* 112*  ALT 375* 290* 231*  ALKPHOS 152* 189* 207*  BILITOT 1.0 1.2 1.3*  PROT 6.9 7.3 7.1  ALBUMIN 1.6* 1.7* 1.7*   No results for input(s): LIPASE, AMYLASE in the last 168 hours. No results for input(s): AMMONIA in the last 168 hours. Coagulation Profile: No results for input(s): INR, PROTIME in the last 168 hours. Cardiac Enzymes: No results for input(s): CKTOTAL, CKMB, CKMBINDEX, TROPONINI in the last 168 hours. CBG: Recent Labs  Lab 08/24/17 0816 08/24/17 1148 08/24/17 1616 08/24/17 2355 08/25/17 0812  GLUCAP 97 144* 119* 91 88   Urine analysis:    Component Value Date/Time   COLORURINE YELLOW 07/30/2017 0836   APPEARANCEUR CLOUDY (A) 07/30/2017 0836   LABSPEC 1.012 07/30/2017 0836   PHURINE 5.0 07/30/2017 0836   GLUCOSEU NEGATIVE 07/30/2017 0836   HGBUR MODERATE (A) 07/30/2017 0836   BILIRUBINUR NEGATIVE 07/30/2017 0836   KETONESUR NEGATIVE 07/30/2017 0836   PROTEINUR NEGATIVE 07/30/2017 0836   NITRITE NEGATIVE 07/30/2017 0836   LEUKOCYTESUR NEGATIVE 07/30/2017 0836     Radiology Studies: Reviewed images personally in health database    Scheduled Meds: . chlorhexidine  15 mL Mouth Rinse BID  . Chlorhexidine Gluconate Cloth  6 each Topical Daily  . ciprofloxacin  500 mg Oral BID  . feeding supplement  1 Container Oral BID BM  . feeding supplement (ENSURE ENLIVE)  237 mL Oral TID BM  . heparin  5,000 Units Subcutaneous Q8H  . hydrALAZINE  25 mg Oral Q8H  . mouth rinse  15 mL Mouth Rinse q12n4p  . metroNIDAZOLE  500 mg Oral Q12H  . sodium chloride flush  10-40 mL Intracatheter Q12H   Continuous Infusions:   LOS: 34 days    Time spent: 1635    Pleas KochJai Liron Eissler, MD [facility] [phone]) [phone]   If  7PM-7AM, please contact night-coverage [email or web address] Password [email or web address] 08/26/2017, 7:45 AM

## 2017-08-26 NOTE — Progress Notes (Signed)
Patient ID: Caleb Singh, male   DOB: August 22, 1955, 62 y.o.   MRN: [id number]  This NP visited patient at the bedside as a follow up to  yesterday's GOCs meeting.  Patient is alert and oriented, speaks optimistically and looks much improved.  He tells me he feels better, denies pain, and that he was up and walked about the room today.  We discussed the importance of ongoing active range of motion, importance of nutrition and ways to increase oral intake in the role of meditation and prayer has an element of healing and wellness.  Call placed to Dr Orvan Falconerampbell with ID to discuss need for continued isolation.  Dr. Orvan Falconerampbell reviewed chart and gave verbal order to discontinue isolation.  Discussed with patient the importance of continued conversation with his family and his medical providers regarding overall plan of care and treatment options,  ensuring decisions are within the context of the patients values and GOCs.  Questions and concerns addressed  Time in  1445         Time out    1520 total time spent on the unit was 35 minutes  Discussed with bedside nurse   Greater than 50% of the time was spent in counseling and coordination of care  Caleb CreedMary Larach NP  Palliative Medicine Team Team Phone # [phone] Pager [phone]

## 2017-08-26 NOTE — Progress Notes (Signed)
Physical Therapy Treatment Patient Details Name: Caleb Singh MRN: [id number] DOB: [birth year] Today's Date: 08/26/2017    History of Present Illness Pt is a 62 yo male admitted 07/23/17 with intestinal perforation. Now s/p resection & ileostomy on 12/5 and 12/6; s/p abscess draining 12/12. VDRF. A fib with RVR. ETT 12/20 >> 12/24. 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 PMH includes ETOH abuse, PE.     PT Comments    Pt making steady progress. Able to amb in hallway short distance. Needed encouragement initially to get up but was appreciative afterwards.   Follow Up Recommendations  SNF     Equipment Recommendations  Other (comment)(TBA)    Recommendations for Other Services       Precautions / Restrictions Precautions Precautions: Fall Restrictions Weight Bearing Restrictions: No    Mobility  Bed Mobility Overal bed mobility: Needs Assistance Bed Mobility: Supine to Sit;Sit to Supine     Supine to sit: Min assist Sit to supine: Min assist   General bed mobility comments: Assist to elevate trunk into sitting and return legs up into bed returning to supine  Transfers Overall transfer level: Needs assistance Equipment used: Rolling walker (2 wheeled) Transfers: Sit to/from [facility] Sit to Stand: Min assist;+2 safety/equipment Stand pivot transfers: Min assist;+2 safety/equipment       General transfer comment: Assist to bring hips up and for balance. Chair to bed with RW  Ambulation/Gait Ambulation/Gait assistance: Min assist;+2 safety/equipment Ambulation Distance (Feet): 45 Feet Assistive device: Rolling walker (2 wheeled) Gait Pattern/deviations: Step-through pattern;Decreased step length - right;Decreased step length - left;Shuffle;Trunk flexed Gait velocity: decr Gait velocity interpretation: Below normal speed for age/gender General Gait Details: Assist for balance and support. Verbal  cues to stand more erect   Stairs            Wheelchair Mobility    Modified Rankin (Stroke Patients Only)       Balance Overall balance assessment: Needs assistance Sitting-balance support: Feet supported;No upper extremity supported Sitting balance-Leahy Scale: Fair     Standing balance support: Bilateral upper extremity supported Standing balance-Leahy Scale: Poor Standing balance comment: walker and min assist for static standing                            Cognition Arousal/Alertness: Awake/alert Behavior During Therapy: WFL for tasks assessed/performed;Anxious Overall Cognitive Status: Within Functional Limits for tasks assessed                                        Exercises      General Comments        Pertinent Vitals/Pain Pain Assessment: Faces Faces Pain Scale: Hurts little more Pain Location: abdomen with movement Pain Descriptors / Indicators: Sore Pain Intervention(s): Limited activity within patient's tolerance;Monitored during session    Home Living                      Prior Function            PT Goals (current goals can now be found in the care plan section) Acute Rehab PT Goals PT Goal Formulation: With patient Time For Goal Achievement: 09/09/17 Potential to Achieve Goals: Good Progress towards PT goals: Goals met and updated - see care plan    Frequency    Min  2X/week      PT Plan Current plan remains appropriate    Co-evaluation              AM-PAC PT "6 Clicks" Daily Activity  Outcome Measure  Difficulty turning over in bed (including adjusting bedclothes, sheets and blankets)?: Unable Difficulty moving from lying on back to sitting on the side of the bed? : Unable Difficulty sitting down on and standing up from a chair with arms (e.g., wheelchair, bedside commode, etc,.)?: Unable Help needed moving to and from a bed to chair (including a wheelchair)?: A Little Help  needed walking in hospital room?: A Little Help needed climbing 3-5 steps with a railing? : Total 6 Click Score: 10    End of Session Equipment Utilized During Treatment: Gait belt Activity Tolerance: Patient tolerated treatment well;Patient limited by fatigue Patient left: in bed;with call bell/phone within reach Nurse Communication: Mobility status PT Visit Diagnosis: Muscle weakness (generalized) (M62.81);Other abnormalities of gait and mobility (R26.89)     Time: 2536-6440 PT Time Calculation (min) (ACUTE ONLY): 18 min  Charges:  $Gait Training: 8-22 mins                    G Codes:       [facility] PT [place] 08/26/2017, 6:47 PM

## 2017-08-26 NOTE — Progress Notes (Signed)
2D Echocardiogram has been performed.  Pieter PartridgeBrooke S Gale Hulse 08/26/2017, 2:45 PM

## 2017-08-26 NOTE — Progress Notes (Signed)
CSW informed pt getting close to being stable for DC  CSW faxed to all local [facility] contracted facilities- awaiting responses  Burna SisJenna H. Lashonda Sonneborn, LCSW Clinical Social Worker [phone]

## 2017-08-26 NOTE — Progress Notes (Cosign Needed)
Progress Note  Patient Name: Caleb Singh Date of Encounter: 08/26/2017  Primary Cardiologist: new to Dr. Antoine Poche  Subjective   Denies SOB,palpitations or CP.  He is OOB to chair this AM, feeling better and taking PO   Inpatient Medications    Scheduled Meds: . chlorhexidine  15 mL Mouth Rinse BID  . Chlorhexidine Gluconate Cloth  6 each Topical Daily  . ciprofloxacin  500 mg Oral BID  . feeding supplement  1 Container Oral BID BM  . feeding supplement (ENSURE ENLIVE)  237 mL Oral TID BM  . heparin  5,000 Units Subcutaneous Q8H  . hydrALAZINE  25 mg Oral Q8H  . mouth rinse  15 mL Mouth Rinse q12n4p  . metroNIDAZOLE  500 mg Oral Q12H  . sodium chloride flush  10-40 mL Intracatheter Q12H   Continuous Infusions:  PRN Meds: acetaminophen, iopamidol, labetalol, lip balm, LORazepam, menthol-cetylpyridinium, morphine injection, nitroGLYCERIN, ondansetron (ZOFRAN) IV, sodium chloride flush   Vital Signs    Vitals:   08/26/17 0600 08/26/17 0606 08/26/17 0700 08/26/17 0737  BP:  (!) 143/101    Pulse: 91 100 (!) 102   Resp: (!) 30 (!) 32 (!) 29   Temp:    98.3 F (36.8 C)  TempSrc:    Axillary  SpO2: 100% 96% 97%   Weight:      Height:        Intake/Output Summary (Last 24 hours) at 08/26/2017 0848 Last data filed at 08/26/2017 0500 Gross per 24 hour  Intake 1080 ml  Output 3370 ml  Net -2290 ml   Filed Weights   08/23/17 0409 08/24/17 0600 08/26/17 0500  Weight: 161 lb 2.5 oz (73.1 kg) 151 lb 7.3 oz (68.7 kg) 143 lb 11.8 oz (65.2 kg)    Telemetry    SR, no significant bradycardia or pauses are noted- Personally Reviewed  ECG    No new EKGs - Personally Reviewed  Physical Exam   GEN: ill appearing, in no acute distress.   Neck: No JVD Cardiac: RRR, no significant murmurs, rubs, or gallops are appreciated Respiratory: CTA b/l ant/lat auscultation GI: not examined MS: No edema; quite thin Neuro:  Nonfocal  Psych: Normal affect   Labs     Chemistry Recent Labs  Lab 08/21/17 0348 08/22/17 0400  08/24/17 0437 08/25/17 0340 08/26/17 0339  NA  --  132*   < > 139 140 141  K  --  3.7   < > 3.6 3.9 4.0  CL  --  101   < > 106 112* 112*  CO2  --  22   < > 24 23 22   GLUCOSE  --  295*   < > 97 92 101*  BUN  --  38*   < > 34* 30* 29*  CREATININE  --  1.97*   < > 2.12* 2.09* 1.91*  CALCIUM  --  7.7*   < > 8.2* 8.3* 8.4*  PROT 7.3 7.1  --   --   --   --   ALBUMIN 1.7* 1.7*  --   --   --   --   AST 186* 112*  --   --   --   --   ALT 290* 231*  --   --   --   --   ALKPHOS 189* 207*  --   --   --   --   BILITOT 1.2 1.3*  --   --   --   --  GFRNONAA  --  34*   < > 31* 32* 36*  GFRAA  --  40*   < > 36* 37* 41*  ANIONGAP  --  9   < > 9 5 7    < > = values in this interval not displayed.     Hematology Recent Labs  Lab 08/24/17 0437 08/25/17 0340 08/26/17 0339  WBC 10.1 10.3 10.5  RBC 2.58* 2.61* 2.80*  HGB 7.3* 7.3* 7.8*  HCT 23.8* 24.0* 25.9*  MCV 92.2 92.0 92.5  MCH 28.3 28.0 27.9  MCHC 30.7 30.4 30.1  RDW 16.7* 16.3* 16.3*  PLT 440* 478* 505*    Cardiac Enzymes No results for input(s): TROPONINI in the last 168 hours. No results for input(s): TROPIPOC in the last 168 hours.   BNPNo results for input(s): BNP, PROBNP in the last 168 hours.   DDimer No results for input(s): DDIMER in the last 168 hours.   Radiology      Cardiac Studies   08/13/17: LCH/temp pacing wire Successful placement of RIJ tranvenous pacing wire in RV apex. Back-up rate set at 40 bpm with threshold 0.548mA. Normal coronary arteries.  LV pressure 148/12  07/24/17: TTE Study Conclusions - Left ventricle: The cavity size was normal. Wall thickness was   increased in a pattern of mild LVH. Systolic function was mildly   reduced. The estimated ejection fraction was in the range of 45%   to 50%. Doppler parameters are consistent with abnormal left   ventricular relaxation (grade 1 diastolic dysfunction).  Patient Profile     62 y.o.  male with PMHx of remote PE, record mentions ETOH abuse, nothing otherwise admitted 07/23/17 with abd pain found with perforated bowel and emergently taken to OR.  Hospital course has been complicated with  Peritonitis, Ecoli bacteremia septic shock,hypotension requiring pressors ARF requiring CRRT Re-operated 12/6 for re-exploration, ileostomy, ilieocecotomy, and closure of abdomen 07/27/17 off pressors Intermittent need for PRBC 07/29/17 reports of rapid AFib >> cardizem gtt >> pauses 07/29/17 CRRT held with urine out put  07/30/17 febrile, unable to wean from vent 07/31/17 re-operated, drainage of abscess and pelvic hematoma                 rare gram-positive rods, moderate gram-negative rods 12/13//18 rapid AFib, esmolol 08/02/17 extubated, lopressor added, encephalopathic/delerium 08/07/17 evening Event RN note reported 31 second followed 25 second asystolic event (pt unresponsive), both events associated with patient using incentive spirometer 08/08/17  01:53PEA arrest occured after a few days of reported gradual decline in status, re-intubated                Note reports pt became apnic 08/08/17: IR CT guided drain placement for abdominal abscess 08/10/17 anemia requiring PRBC 08/12/17 extubated 08/13/17 cardiology consulted for 4-5 second pauses, seemed to correlate with stimulation  Such as suction/bathing, brady felt to be vagal 08/13/17 LHC w/normal coronaries, temp wire placement 08/15/17 EP consulted with intermittent pacing noted, temp pacing turned down to 30bpm 08/15/17: surgery describes incision as open and necrotic with chronic dehiscence ostomy viable working, Left JP purulent, Right drains one is purulent the other is serous     Assessment & Plan    1. Bradycardia, sinus node dysfunction     Felt to be vagally mediated     Temp wire has been removed     No significant pauses or bradycardia     Will stop the PRN labetalol and add amlodipine  Going forward,  recommend avoiding nodal  blocking agents for BP  2. Mild CM w/EF 45-50%     Await f/u echo        EP service will sign off though remain available, please recall if needed    For questions or updates, please contact [facility] Please consult [email or web address] for contact info under Cardiology/STEMI.      Signed, Sheilah Pigeon, PA-C  08/26/2017, 8:48 AM

## 2017-08-26 NOTE — Progress Notes (Signed)
Referring Physician(s): Dr. Marin Olp  Supervising Physician: Jolaine Click  Patient Status:  [facility] - In-pt  Chief Complaint:  Intra-abdominal fluid collections  Subjective:  Caleb Singh is sitting up in the chair. No new complaints.  Allergies: Patient has no known allergies.  Medications: Prior to Admission medications   Medication Sig Start Date End Date Taking? Authorizing Provider  clindamycin (CLEOCIN) 300 MG capsule Take 1 capsule (300 mg total) by mouth 3 (three) times daily. Patient not taking: Reported on 07/23/2017 04/21/16   Dominica Severin, MD  oxyCODONE (OXY IR/ROXICODONE) 5 MG immediate release tablet Take 1-2 tablets (5-10 mg total) by mouth every 3 (three) hours as needed for moderate pain. Patient not taking: Reported on 07/23/2017 04/21/16   Dominica Severin, MD     Vital Signs: BP (!) 146/88   Pulse 95   Temp 98.3 F (36.8 C) (Axillary)   Resp (!) 26   Ht 5\' 10"  (1.778 m)   Wt 143 lb 11.8 oz (65.2 kg)   SpO2 100%   BMI 20.62 kg/m   Physical Exam Awake and alert NAD RLQ drain remains in place Still with dark bloody output Only ~5 mL recorded.  Imaging: Ct Abdomen Pelvis Wo Contrast  Result Date: 08/22/2017 CLINICAL DATA:  62 year old male with a history of distal small bowel perforation status post ileo seek ectomy complicated by intra-abdominal abscess formation. Patient has a percutaneous drainage catheter in the left subdiaphragmatic space, a percutaneous drainage catheter in the right lower quadrant as well as 2 surgically placed drains in the right upper quadrant. Current output is approximately 20-30 mL per day. EXAM: CT ABDOMEN AND PELVIS WITHOUT CONTRAST TECHNIQUE: Multidetector CT imaging of the abdomen and pelvis was performed following the standard protocol without IV contrast. COMPARISON:  Multiple prior studies, most recent CT scan of the abdomen and pelvis 12/227/2018 FINDINGS: Lower chest: Improving left-sided pleural effusion.  Persistent bibasilar atelectasis. Stable cardiac and visualized mediastinal contours. Unremarkable distal thoracic esophagus. Hepatobiliary: Normal hepatic contour morphology. No discrete hepatic lesion. Gallbladder distension with mild gallbladder wall thickening. No intra or extrahepatic biliary ductal dilatation. Pancreas: Unremarkable. No pancreatic ductal dilatation or surrounding inflammatory changes. Spleen: Normal in size without focal abnormality. Adrenals/Urinary Tract: Normal appearance of the adrenal glands. Limited evaluation the kidneys in the absence of intravenous contrast. No evidence of hydronephrosis or nephrolithiasis. Stable complex/hemorrhagic subcentimeter cyst in the lower pole of left kidney. Stable 2.8 cm simple cyst in the interpolar right kidney. Ureters and bladder are unremarkable. Stomach/Bowel: Surgical changes of ileocecectomy with diverting RLQ ileostomy are again evident. Persistent diffuse mild small bowel wall thickening. Several loops of small bowel are mildly dilated. No overt obstruction. Vascular/Lymphatic: Limited evaluation in the absence of intravenous contrast. No aneurysm. Reproductive: Prostate is unremarkable. Other: The left upper quadrant perisplenic drainage catheter remains in good position. No measurable residual fluid in this region. Two surgically placed drains are present in the right upper quadrant anterior and postero inferior to the liver. No significant residual measurable fluid collection about the liver. The most recently placed drainage catheter in the right lower quadrant remains in good position. There is been significant interval decrease in the volume of the high attenuation fluid collection in the right pericolic gutter. The residual fluid component measures approximately 4.7 x 1.8 cm compared to 7.2 x 4.0 cm previously. The high attenuation collection in in the anatomic pelvis is also smaller in size presently measuring 3.9 by 4.1 cm compared to  4.5 x 4.8 cm.  A high attenuation collection in the region of the ileal mesenteries is also slightly smaller in size measuring approximately 4 4.7 x 3.5 cm compared to 5.4 x 3.9 cm previously. High attenuation fluid within the left pericolic gutter extending inferiorly into the bilateral inguinal recesses is also smaller compared to prior imaging. An index pocket of the fluid measured in the left pericolic gutter at the level of the mid kidneys measures 5.6 x 2.1 cm today compared to 7.0 x 2.6 cm previously. Musculoskeletal: No acute osseous abnormality. IMPRESSION: 1. Overall, significant interval improvement in multiple intra-abdominal fluid collections compared to 08/15/2017 as detailed below. 2. The left upper quadrant perisplenic drainage catheter remains in good position and there is no residual measurable fluid collection. 3. The 2 surgical drains in the perihepatic space remain in good position with no significant residual measurable fluid. 4. The most recently placed right lower quadrant drainage catheter remains in good position with persistent but decreasing fluid in the right pericolic gutter. 5. The undrained intermediate attenuation fluid within the left pericolic gutter, ileal mesenteries, anatomic pelvis and bilateral inguinal recesses is all decreasing in volume. 6. The gallbladder distension with mild gallbladder wall thickening is nonspecific and may be secondary to prolonged NPO status versus acalculous cholecystitis in the appropriate clinical setting. 7. Improving left-sided pleural effusion and bibasilar atelectasis. 8. Persistent diffuse mesenteric stranding and small bowel wall thickening consistent with evolving postsurgical changes and residual inflammation. Electronically Signed   By: Malachy MoanHeath  McCullough M.D.   On: 08/22/2017 13:49    Labs:  CBC: Recent Labs    08/23/17 0401 08/24/17 0437 08/25/17 0340 08/26/17 0339  WBC 11.8* 10.1 10.3 10.5  HGB 7.3* 7.3* 7.3* 7.8*  HCT 23.1*  23.8* 24.0* 25.9*  PLT 441* 440* 478* 505*    COAGS: Recent Labs    07/23/17 1455 07/24/17 0350 07/31/17 0745 08/08/17 1329  INR 1.78 1.77 1.58 1.45    BMP: Recent Labs    08/23/17 0401 08/24/17 0437 08/25/17 0340 08/26/17 0339  NA 137 139 140 141  K 3.6 3.6 3.9 4.0  CL 107 106 112* 112*  CO2 23 24 23 22   GLUCOSE 198* 97 92 101*  BUN 37* 34* 30* 29*  CALCIUM 8.0* 8.2* 8.3* 8.4*  CREATININE 2.06* 2.12* 2.09* 1.91*  GFRNONAA 33* 31* 32* 36*  GFRAA 38* 36* 37* 41*    LIVER FUNCTION TESTS: Recent Labs    08/15/17 0802 08/19/17 0805 08/21/17 0348 08/22/17 0400  BILITOT 1.2 1.0 1.2 1.3*  AST 375* 486* 186* 112*  ALT 145* 375* 290* 231*  ALKPHOS 142* 152* 189* 207*  PROT 6.6 6.9 7.3 7.1  ALBUMIN 1.4* 1.6* 1.7* 1.7*    Assessment and Plan:  Abdominal fluid collections.  S/P LUQ drain placement by Dr. Grace IsaacWatts on 08/08/2017 = this has since been removed.  S/P RLQ drain placement by Dr. Loreta AveWagner 08/17/2017 = continue routine care with flushes.  Since only 5 mL output recorded in 3 days.  Discussed with Dr. Bonnielee HaffHoss. He reviewed images. OK to remove drain.  Drain removed without difficulty. Sterile dressing placed. Can remove this dressing in 48 hours.   Electronically Signed: Gwynneth MacleodWENDY S Natally Ribera, PA-C 08/26/2017, 11:10 AM   I spent a total of 15 Minutes at the the patient's bedside AND on the patient's hospital floor or unit, greater than 50% of which was counseling/coordinating care for f/u drains.

## 2017-08-27 NOTE — Progress Notes (Signed)
[facility] Patient going to room 6N 13. Patient eating lunch at this time, will transfer when he is finished. All belongings accounted for.

## 2017-08-27 NOTE — Progress Notes (Addendum)
4pm All [facility] contracted facilities are full or have denied patient- CSW left message for supervisor to discuss other options  10am [place] [facility] [place]  [facility]:  [facility]- reviewing referral- might have bed if medically cleared  [facility]  [facility]  [facility]- left message for admissions  [facility]- [facility]- no [facility] available  CSW continuing to follow  Burna SisJenna H. Ashawnti Tangen, LCSW Clinical Social Worker [phone]

## 2017-08-27 NOTE — Progress Notes (Signed)
14 Days Post-Op   Subjective/Chief Complaint: Last drain removed by IR PO intake better Tolerating plenty of liquids/ nutritional supplements Awaiting floor bed to transfer out of unit   Objective: Vital signs in last 24 hours: Temp:  [98.2 F (36.8 C)-98.8 F (37.1 C)] 98.8 F (37.1 C) (01/07 2000) Pulse Rate:  [80-116] 94 (01/08 0600) Resp:  [17-36] 19 (01/08 0600) BP: (125-149)/(87-101) 125/95 (01/08 0600) SpO2:  [97 %-100 %] 99 % (01/08 0600) Weight:  [66.3 kg (146 lb 2.6 oz)] 66.3 kg (146 lb 2.6 oz) (01/08 0458) Last BM Date: 08/26/17  Intake/Output from previous day: 01/07 0701 - 01/08 0700 In: 2040 [P.O.:2040] Out: 4500 [Urine:975; Stool:3525] Intake/Output this shift: No intake/output data recorded.  General appearance: alert, cooperative and no distress GI: soft, mild incisional tenderness; ileostomy functioning well Wound - cleaning up on the sides; loose sutures; some fibrinous necrotic fascia at the base, but improving.  Lab Results:  Recent Labs    08/25/17 0340 08/26/17 0339  WBC 10.3 10.5  HGB 7.3* 7.8*  HCT 24.0* 25.9*  PLT 478* 505*   BMET Recent Labs    08/25/17 0340 08/26/17 0339  NA 140 141  K 3.9 4.0  CL 112* 112*  CO2 23 22  GLUCOSE 92 101*  BUN 30* 29*  CREATININE 2.09* 1.91*  CALCIUM 8.3* 8.4*   PT/INR No results for input(s): LABPROT, INR in the last 72 hours. ABG No results for input(s): PHART, HCO3 in the last 72 hours.  Invalid input(s): PCO2, PO2  Studies/Results: No results found.  Anti-infectives: Anti-infectives (From admission, onward)   Start     Dose/Rate Route Frequency Ordered Stop   08/25/17 1200  ciprofloxacin (CIPRO) tablet 500 mg     500 mg Oral 2 times daily 08/25/17 1154     08/25/17 1200  metroNIDAZOLE (FLAGYL) tablet 500 mg     500 mg Oral Every 12 hours 08/25/17 1154     08/10/17 0500  vancomycin (VANCOCIN) 1,500 mg in sodium chloride 0.9 % 500 mL IVPB  Status:  Discontinued     1,500 mg 250  mL/hr over 120 Minutes Intravenous Every 48 hours 08/08/17 0728 08/12/17 1018   08/08/17 2200  anidulafungin (ERAXIS) 100 mg in sodium chloride 0.9 % 100 mL IVPB  Status:  Discontinued     100 mg 78 mL/hr over 100 Minutes Intravenous Every 24 hours 08/08/17 0524 08/12/17 1018   08/08/17 0445  vancomycin (VANCOCIN) 1,500 mg in sodium chloride 0.9 % 500 mL IVPB     1,500 mg 250 mL/hr over 120 Minutes Intravenous  Once 08/08/17 0431 08/08/17 0659   08/08/17 0430  vancomycin (VANCOCIN) 1,250 mg in sodium chloride 0.9 % 250 mL IVPB  Status:  Discontinued     1,250 mg 166.7 mL/hr over 90 Minutes Intravenous  Once 08/08/17 0418 08/08/17 0428   08/08/17 0415  anidulafungin (ERAXIS) 200 mg in sodium chloride 0.9 % 200 mL IVPB     200 mg 78 mL/hr over 200 Minutes Intravenous  Once 08/08/17 0413 08/08/17 0816   08/05/17 1000  piperacillin-tazobactam (ZOSYN) IVPB 3.375 g  Status:  Discontinued     3.375 g 12.5 mL/hr over 240 Minutes Intravenous Every 8 hours 08/05/17 0946 08/24/17 0945   08/02/17 1400  meropenem (MERREM) 1 g in sodium chloride 0.9 % 100 mL IVPB  Status:  Discontinued     1 g 200 mL/hr over 30 Minutes Intravenous Every 12 hours 08/02/17 1111 08/05/17 0945   07/29/17 0600  piperacillin-tazobactam (ZOSYN) IVPB 3.375 g  Status:  Discontinued     3.375 g 12.5 mL/hr over 240 Minutes Intravenous Every 8 hours 07/29/17 0518 08/02/17 1036   07/25/17 2000  vancomycin (VANCOCIN) IVPB 1000 mg/200 mL premix  Status:  Discontinued     1,000 mg 200 mL/hr over 60 Minutes Intravenous Every 48 hours 07/23/17 2042 07/24/17 0956   07/24/17 2200  piperacillin-tazobactam (ZOSYN) 3.375 g in dextrose 5 % 50 mL IVPB  Status:  Discontinued     3.375 g 100 mL/hr over 30 Minutes Intravenous Every 6 hours 07/24/17 2006 07/29/17 0518   07/24/17 2100  anidulafungin (ERAXIS) 100 mg in sodium chloride 0.9 % 100 mL IVPB  Status:  Discontinued     100 mg 78 mL/hr over 100 Minutes Intravenous Every 24 hours 07/23/17  2039 08/05/17 0945   07/24/17 0000  anidulafungin (ERAXIS) 100 mg in sodium chloride 0.9 % 100 mL IVPB  Status:  Discontinued     100 mg 78 mL/hr over 100 Minutes Intravenous Every 24 hours 07/23/17 2038 07/23/17 2039   07/23/17 2200  piperacillin-tazobactam (ZOSYN) IVPB 3.375 g  Status:  Discontinued     3.375 g 12.5 mL/hr over 240 Minutes Intravenous Every 8 hours 07/23/17 2043 07/24/17 2006   07/23/17 2100  anidulafungin (ERAXIS) 200 mg in sodium chloride 0.9 % 200 mL IVPB     200 mg 78 mL/hr over 200 Minutes Intravenous  Once 07/23/17 2038 07/24/17 0239   07/23/17 2100  vancomycin (VANCOCIN) 1,500 mg in sodium chloride 0.9 % 500 mL IVPB     1,500 mg 250 mL/hr over 120 Minutes Intravenous  Once 07/23/17 2041 07/23/17 2317   07/23/17 1515  piperacillin-tazobactam (ZOSYN) IVPB 3.375 g     3.375 g 100 mL/hr over 30 Minutes Intravenous  Once 07/23/17 1511 07/23/17 1611      Assessment/Plan: Perforated distal small boweland ischemia of cecum 1.S/Pexploratory laparotomy, small bowel resection without anastomosis, placement of open wound VAC system 07/23/17 Dr. Gaynelle Adu 2. S/p Reexploration of abdomen, ileocecectomy, creation of end ileostomy,closure of abdominal wall, 07/25/17, Dr. Almond Lint 3. Exploratory laparotomy, drainage of abdominal abscess and evacuation of pelvic hematoma, RUQ drain, and left lateral drain placement, 07/31/17, Dr. Glenna Fellows (Findings:Subdiaphragmatic and subhepatic abscesses.Large organizing pelvic hematoma. Apparent necrotic tissue left lobe of the liver.) Bacteremia - Enterobacteriaceae + E Coli Cardiac arrest - Code Blue 08/08/17  Acute renal failure-CRRT discontinued 07/27/17  Malnutrition/Deconditioning -switchto soft diet as having issues with dentition, taking Ensure GI-needs continued dressing changes, wound isdehisced but seems to be scarred into place.  High risk for hernia, lower risk for evisceration IRdrained collection  posterior to anastomosis - drains removed ID:off all abx now RUE:AVWU - Heparin Follow-up:Dr. Gaynelle Adu  Patient appears to be ready for SNF placement - wound care with wet to dry dressings/ ostomy care/ physical therapy/ good nutrition   Caleb Singh 08/27/2017

## 2017-08-27 NOTE — Progress Notes (Signed)
Hospitalist progress note   Lamar LaundryFrederick Huestis  BJY:[id number]:[id number] DOB: February 05, 1956 DOA: 07/23/2017 PCP: Patient, No Pcp Per   Specialists:   Brief Narrative:  [id number] male smoker admit 12/4 with perf viscus-taken to Or emergently-K5.4, Creat 3.9, Lactate 10  And HR 150 on admit CRRT started on admit-had ileostomy and Ex-lap 12/12 for hematoma-had PEA arrest intubated-LHC and Temp pacer 2/2 to brady Transferred to [facility] 1/7  Assessment & Plan:   Assessment:  The primary encounter diagnosis was Perforation bowel (HCC). Diagnoses of AKI (acute kidney injury) (HCC), Elective surgery, Increased oropharyngeal secretions, Acute respiratory failure with hypoxia (HCC), Acute respiratory failure (HCC), Encounter for central line placement, Endotracheal tube present, Intestinal perforation (HCC), Respiratory failure (HCC), History of abdominal abscess, Endotracheally intubated, Postprocedural intraabdominal abscess, Encounter for nasogastric (NG) tube placement, History of temporary cardiac pacemaker treatment, and Abdominal pain were also pertinent to this visit.  Perf Abd viscus-Ex-lap-Has ileostomy and abd wound with drain-drains removed 1/7, cleared by surgery for d/c--Abx restarted for 7 more days post-op cipro flagyll until 1/13 and then stop Bradycardia with LHC and Temp pacing until 1/5-complicated by Afib-EP following-ON hydralazine now-further med adjustment as per them--added amlodipine 5 od to hydralazine 25 q8-avoid nodal agents on d/c given brady AKI in setting sepsis-Was on CRRT until 12/8--not currently on IVF Anemia of critical illness and expected post-op losses-Hemoglobin 7.8, normocytic-start Iron when able Hyperglycemia without diag DM-a1c 5.3--just monitor    DVT prophylaxis: lovenoex  Code Status:   dnr   Family Communication:   None  Disposition Plan: await dipso as per SW   Consultants:   Card  gen surg  ir  Procedures:   mulitple  Antimicrobials:    cipro 1/6  flagyl  1/6  Subjective:  Better moving around just tx to tele no fever chills hasnt been oob yet today  No other concern  Objective: Vitals:   08/27/17 1127 08/27/17 1200 08/27/17 1300 08/27/17 1418  BP:  (!) 142/95  (!) 182/98  Pulse:  (!) 108 (!) 101 (!) 120  Resp:  (!) 27 (!) 29 18  Temp: 97.8 F (36.6 C)   97.9 F (36.6 C)  TempSrc: Oral   Oral  SpO2:  100% 100% 100%  Weight:      Height:        Intake/Output Summary (Last 24 hours) at 08/27/2017 1556 Last data filed at 08/27/2017 1424 Gross per 24 hour  Intake 1420 ml  Output 4590 ml  Net -3170 ml   Filed Weights   08/24/17 0600 08/26/17 0500 08/27/17 0458  Weight: 68.7 kg (151 lb 7.3 oz) 65.2 kg (143 lb 11.8 oz) 66.3 kg (146 lb 2.6 oz)    Examination:  eomi Perrla Awake alert in nad s1 s2 tachy  abd soft - ileostomy on R side and wound in central abd Neuro intact moving 4 limbs equally No le edema    Data Reviewed: I have personally reviewed following labs and imaging studies  CBC: Recent Labs  Lab 08/21/17 0348 08/22/17 0400 08/23/17 0401 08/24/17 0437 08/25/17 0340 08/26/17 0339  WBC 13.8* 14.9* 11.8* 10.1 10.3 10.5  NEUTROABS 9.6*  --   --   --   --   --   HGB 8.2* 7.1* 7.3* 7.3* 7.3* 7.8*  HCT 26.6* 24.1* 23.1* 23.8* 24.0* 25.9*  MCV 93.7 92.3 92.4 92.2 92.0 92.5  PLT 467* 441* 441* 440* 478* 505*   Basic Metabolic Panel: Recent Labs  Lab 08/22/17 0400 08/23/17 0401 08/24/17 0437 08/25/17  0340 08/26/17 0339  NA 132* 137 139 140 141  K 3.7 3.6 3.6 3.9 4.0  CL 101 107 106 112* 112*  CO2 22 23 24 23 22   GLUCOSE 295* 198* 97 92 101*  BUN 38* 37* 34* 30* 29*  CREATININE 1.97* 2.06* 2.12* 2.09* 1.91*  CALCIUM 7.7* 8.0* 8.2* 8.3* 8.4*  MG 1.9  --   --   --   --   PHOS 3.7  --   --   --   --    GFR: Estimated Creatinine Clearance: 37.1 mL/min (A) (by C-G formula based on SCr of 1.91 mg/dL (H)). Liver Function Tests: Recent Labs  Lab 08/21/17 0348 08/22/17 0400  AST 186* 112*  ALT 290*  231*  ALKPHOS 189* 207*  BILITOT 1.2 1.3*  PROT 7.3 7.1  ALBUMIN 1.7* 1.7*   No results for input(s): LIPASE, AMYLASE in the last 168 hours. No results for input(s): AMMONIA in the last 168 hours. Coagulation Profile: No results for input(s): INR, PROTIME in the last 168 hours. Cardiac Enzymes: No results for input(s): CKTOTAL, CKMB, CKMBINDEX, TROPONINI in the last 168 hours. CBG: Recent Labs  Lab 08/24/17 0816 08/24/17 1148 08/24/17 1616 08/24/17 2355 08/25/17 0812  GLUCAP 97 144* 119* 91 88   Urine analysis:    Component Value Date/Time   COLORURINE YELLOW 07/30/2017 0836   APPEARANCEUR CLOUDY (A) 07/30/2017 0836   LABSPEC 1.012 07/30/2017 0836   PHURINE 5.0 07/30/2017 0836   GLUCOSEU NEGATIVE 07/30/2017 0836   HGBUR MODERATE (A) 07/30/2017 0836   BILIRUBINUR NEGATIVE 07/30/2017 0836   KETONESUR NEGATIVE 07/30/2017 0836   PROTEINUR NEGATIVE 07/30/2017 0836   NITRITE NEGATIVE 07/30/2017 0836   LEUKOCYTESUR NEGATIVE 07/30/2017 0836     Radiology Studies: Reviewed images personally in health database    Scheduled Meds: . amLODipine  5 mg Oral Daily  . chlorhexidine  15 mL Mouth Rinse BID  . Chlorhexidine Gluconate Cloth  6 each Topical Daily  . ciprofloxacin  500 mg Oral BID  . feeding supplement  1 Container Oral BID BM  . feeding supplement (ENSURE ENLIVE)  237 mL Oral TID BM  . heparin  5,000 Units Subcutaneous Q8H  . hydrALAZINE  25 mg Oral Q8H  . mouth rinse  15 mL Mouth Rinse q12n4p  . metroNIDAZOLE  500 mg Oral Q12H  . sodium chloride flush  10-40 mL Intracatheter Q12H   Continuous Infusions:   LOS: 35 days    Time spent: 60    Pleas Koch, MD [facility] ([phone]   If 7PM-7AM, please contact night-coverage [email or web address] Password [facility] 08/27/2017, 3:56 PM

## 2017-08-27 NOTE — Progress Notes (Signed)
Attending paged to see if we may d/c PICC line. Also asked if patient may be discharged today to a SNF facility, surgery MD has indicated patient is ready for SNF from his standpoint. Waiting on call back.

## 2017-08-28 DIAGNOSIS — R531 Weakness: Secondary | ICD-10-CM

## 2017-08-28 DIAGNOSIS — R1084 Generalized abdominal pain: Secondary | ICD-10-CM

## 2017-08-28 DIAGNOSIS — Z419 Encounter for procedure for purposes other than remedying health state, unspecified: Secondary | ICD-10-CM

## 2017-08-28 DIAGNOSIS — Z4659 Encounter for fitting and adjustment of other gastrointestinal appliance and device: Secondary | ICD-10-CM

## 2017-08-28 DIAGNOSIS — Z452 Encounter for adjustment and management of vascular access device: Secondary | ICD-10-CM

## 2017-08-28 DIAGNOSIS — K117 Disturbances of salivary secretion: Secondary | ICD-10-CM

## 2017-08-28 DIAGNOSIS — Z978 Presence of other specified devices: Secondary | ICD-10-CM

## 2017-08-28 DIAGNOSIS — Z515 Encounter for palliative care: Secondary | ICD-10-CM

## 2017-08-28 DIAGNOSIS — Z8679 Personal history of other diseases of the circulatory system: Secondary | ICD-10-CM

## 2017-08-28 DIAGNOSIS — E43 Unspecified severe protein-calorie malnutrition: Secondary | ICD-10-CM

## 2017-08-28 DIAGNOSIS — T8149XA Infection following a procedure, other surgical site, initial encounter: Secondary | ICD-10-CM

## 2017-08-28 DIAGNOSIS — Z7189 Other specified counseling: Secondary | ICD-10-CM

## 2017-08-28 LAB — BASIC METABOLIC PANEL
Anion gap: 10 (ref 5–15)
BUN: 40 mg/dL — ABNORMAL HIGH (ref 6–20)
CHLORIDE: 109 mmol/L (ref 101–111)
CO2: 19 mmol/L — AB (ref 22–32)
CREATININE: 2.09 mg/dL — AB (ref 0.61–1.24)
Calcium: 9 mg/dL (ref 8.9–10.3)
GFR calc non Af Amer: 32 mL/min — ABNORMAL LOW (ref >60)
GFR, EST AFRICAN AMERICAN: 37 mL/min — AB (ref >60)
GLUCOSE: 116 mg/dL — AB (ref 65–99)
Potassium: 4 mmol/L (ref 3.5–5.1)
Sodium: 138 mmol/L (ref 135–145)

## 2017-08-28 NOTE — Progress Notes (Signed)
[facility] Progress Note  15 Days Post-Op  Subjective: CC-  No complaints this morning. Tolerating diet but still does not have a great appetite. He did get 2 containers of Boost and 3 of Ensure yesterday. Good ileostomy output.  Objective: Vital signs in last 24 hours: Temp:  [97.8 F (36.6 C)-98.7 F (37.1 C)] 98.2 F (36.8 C) (01/09 0444) Pulse Rate:  [97-120] 106 (01/09 0444) Resp:  [17-29] 18 (01/09 0444) BP: (142-182)/(95-99) 144/96 (01/09 0444) SpO2:  [98 %-100 %] 98 % (01/09 0444) Last BM Date: 08/27/17  Intake/Output from previous day: 01/08 0701 - 01/09 0700 In: 1260 [P.O.:1260] Out: 3665 [Urine:975; Stool:2690] Intake/Output this shift: No intake/output data recorded.  PE: Gen:  Alert, NAD, pleasant HEENT: EOM's intact, pupils equal and round Card:  RRR, no M/G/R heard Pulm:  CTAB, no W/R/R, effort normal Abd: Soft, ND, NT, +BS, open midline incision with sutures visible/some fibrinous exudate at the base but good granulation tissue at the edges, ileostomy pink and functioning Ext:  Calves soft and nontender Skin: no rashes noted, warm and dry  Lab Results:  Recent Labs    08/26/17 0339  WBC 10.5  HGB 7.8*  HCT 25.9*  PLT 505*   BMET Recent Labs    08/26/17 0339 08/28/17 0428  NA 141 138  K 4.0 4.0  CL 112* 109  CO2 22 19*  GLUCOSE 101* 116*  BUN 29* 40*  CREATININE 1.91* 2.09*  CALCIUM 8.4* 9.0   PT/INR No results for input(s): LABPROT, INR in the last 72 hours. CMP     Component Value Date/Time   NA 138 08/28/2017 0428   K 4.0 08/28/2017 0428   CL 109 08/28/2017 0428   CO2 19 (L) 08/28/2017 0428   GLUCOSE 116 (H) 08/28/2017 0428   BUN 40 (H) 08/28/2017 0428   CREATININE 2.09 (H) 08/28/2017 0428   CALCIUM 9.0 08/28/2017 0428   PROT 7.1 08/22/2017 0400   ALBUMIN 1.7 (L) 08/22/2017 0400   AST 112 (H) 08/22/2017 0400   ALT 231 (H) 08/22/2017 0400   ALKPHOS 207 (H) 08/22/2017 0400   BILITOT 1.3 (H) 08/22/2017 0400    GFRNONAA 32 (L) 08/28/2017 0428   GFRAA 37 (L) 08/28/2017 0428   Lipase     Component Value Date/Time   LIPASE 47 07/23/2017 1455       Studies/Results: No results found.  Anti-infectives: Anti-infectives (From admission, onward)   Start     Dose/Rate Route Frequency Ordered Stop   08/25/17 1200  ciprofloxacin (CIPRO) tablet 500 mg     500 mg Oral 2 times daily 08/25/17 1154     08/25/17 1200  metroNIDAZOLE (FLAGYL) tablet 500 mg     500 mg Oral Every 12 hours 08/25/17 1154     08/10/17 0500  vancomycin (VANCOCIN) 1,500 mg in sodium chloride 0.9 % 500 mL IVPB  Status:  Discontinued     1,500 mg 250 mL/hr over 120 Minutes Intravenous Every 48 hours 08/08/17 0728 08/12/17 1018   08/08/17 2200  anidulafungin (ERAXIS) 100 mg in sodium chloride 0.9 % 100 mL IVPB  Status:  Discontinued     100 mg 78 mL/hr over 100 Minutes Intravenous Every 24 hours 08/08/17 0524 08/12/17 1018   08/08/17 0445  vancomycin (VANCOCIN) 1,500 mg in sodium chloride 0.9 % 500 mL IVPB     1,500 mg 250 mL/hr over 120 Minutes Intravenous  Once 08/08/17 0431 08/08/17 0659   08/08/17 0430  vancomycin (VANCOCIN) 1,250 mg  in sodium chloride 0.9 % 250 mL IVPB  Status:  Discontinued     1,250 mg 166.7 mL/hr over 90 Minutes Intravenous  Once 08/08/17 0418 08/08/17 0428   08/08/17 0415  anidulafungin (ERAXIS) 200 mg in sodium chloride 0.9 % 200 mL IVPB     200 mg 78 mL/hr over 200 Minutes Intravenous  Once 08/08/17 0413 08/08/17 0816   08/05/17 1000  piperacillin-tazobactam (ZOSYN) IVPB 3.375 g  Status:  Discontinued     3.375 g 12.5 mL/hr over 240 Minutes Intravenous Every 8 hours 08/05/17 0946 08/24/17 0945   08/02/17 1400  meropenem (MERREM) 1 g in sodium chloride 0.9 % 100 mL IVPB  Status:  Discontinued     1 g 200 mL/hr over 30 Minutes Intravenous Every 12 hours 08/02/17 1111 08/05/17 0945   07/29/17 0600  piperacillin-tazobactam (ZOSYN) IVPB 3.375 g  Status:  Discontinued     3.375 g 12.5 mL/hr over 240  Minutes Intravenous Every 8 hours 07/29/17 0518 08/02/17 1036   07/25/17 2000  vancomycin (VANCOCIN) IVPB 1000 mg/200 mL premix  Status:  Discontinued     1,000 mg 200 mL/hr over 60 Minutes Intravenous Every 48 hours 07/23/17 2042 07/24/17 0956   07/24/17 2200  piperacillin-tazobactam (ZOSYN) 3.375 g in dextrose 5 % 50 mL IVPB  Status:  Discontinued     3.375 g 100 mL/hr over 30 Minutes Intravenous Every 6 hours 07/24/17 2006 07/29/17 0518   07/24/17 2100  anidulafungin (ERAXIS) 100 mg in sodium chloride 0.9 % 100 mL IVPB  Status:  Discontinued     100 mg 78 mL/hr over 100 Minutes Intravenous Every 24 hours 07/23/17 2039 08/05/17 0945   07/24/17 0000  anidulafungin (ERAXIS) 100 mg in sodium chloride 0.9 % 100 mL IVPB  Status:  Discontinued     100 mg 78 mL/hr over 100 Minutes Intravenous Every 24 hours 07/23/17 2038 07/23/17 2039   07/23/17 2200  piperacillin-tazobactam (ZOSYN) IVPB 3.375 g  Status:  Discontinued     3.375 g 12.5 mL/hr over 240 Minutes Intravenous Every 8 hours 07/23/17 2043 07/24/17 2006   07/23/17 2100  anidulafungin (ERAXIS) 200 mg in sodium chloride 0.9 % 200 mL IVPB     200 mg 78 mL/hr over 200 Minutes Intravenous  Once 07/23/17 2038 07/24/17 0239   07/23/17 2100  vancomycin (VANCOCIN) 1,500 mg in sodium chloride 0.9 % 500 mL IVPB     1,500 mg 250 mL/hr over 120 Minutes Intravenous  Once 07/23/17 2041 07/23/17 2317   07/23/17 1515  piperacillin-tazobactam (ZOSYN) IVPB 3.375 g     3.375 g 100 mL/hr over 30 Minutes Intravenous  Once 07/23/17 1511 07/23/17 1611       Assessment/Plan Perforated distal small boweland ischemia of cecum 1.S/Pexploratory laparotomy, small bowel resection without anastomosis, placement of open wound VAC system 07/23/17 Dr. Gaynelle AduEric Wilson 2. S/p Reexploration of abdomen, ileocecectomy, creation of end ileostomy,closure of abdominal wall, 07/25/17, Dr. Almond LintFaera Byerly 3. Exploratory laparotomy, drainage of abdominal abscess and evacuation  of pelvic hematoma, RUQ drain, and left lateral drain placement, 07/31/17, Dr. Glenna FellowsBenjamin Hoxworth (Findings:Subdiaphragmatic and subhepatic abscesses.Large organizing pelvic hematoma. Apparent necrotic tissue left lobe of the liver.) Bacteremia - Enterobacteriaceae + E Coli Cardiac arrest - Code Blue 08/08/17  Acute renal failure-CRRT discontinued 07/27/17  Malnutrition/Deconditioning - taking in soft diet (due to issues with dentition), and Ensure GI-needs continued dressing changes, wound isdehisced but seems to be scarred into place. High risk for hernia, lower risk for evisceration IRdrained collection posterior  to anastomosis - drains removed ZO:XWRUE and flagyl 1/6>> (planning 5-7 days oral antibiotics post removal of last drain) AVW:UJWJ - Heparin Follow-up:Dr. Gaynelle Adu  Plan: Ready for d/c to SNF from surgical standpoint. Continue BID wet to dry dressing changes. Cipro/flagyl x5-7 days post removal last drain 1/6. Monitor ileostomy output.   LOS: 36 days    Caleb Singh , [facility] 08/28/2017, 7:41 AM Pager: [phone] Consults: [phone] Mon-Fri 7:00 am-4:30 pm Sat-Sun 7:00 am-11:30 am

## 2017-08-28 NOTE — Progress Notes (Signed)
Nutrition Follow-up  DOCUMENTATION CODES:   Severe malnutrition in context of acute illness/injury  INTERVENTION:   -D/c Boost Breeze po TID, each supplement provides 250 kcal and 9 grams of protein -Continue Ensure Enlive po TID, each supplement provides 350 kcal and 20 grams of protein  NUTRITION DIAGNOSIS:   Severe Malnutrition related to acute illness(Perforated distal small bowel and ischemia of cecum) as evidenced by energy intake < 75% for > 7 days, mild fat depletion, moderate fat depletion, mild muscle depletion, moderate muscle depletion, percent weight loss.  Ongoing  GOAL:   Patient will meet greater than or equal to 90% of their needs  Progressing  MONITOR:   PO intake, Supplement acceptance, Labs, Weight trends, Skin, I & O's  REASON FOR ASSESSMENT:   Ventilator    ASSESSMENT:   62 y.o. male with no significant past medical history complaining of severely worsening abdominal distention, pain, nonbloody, nonbilious, non-coffee-ground emesis and lack of bowel movements over the course of the last 3 days no BM in 48 hours, feels like he needs to deficate.  He denies prior abdominal surgeries, no fever or chills.  Patient is unsure if he is passing flatus.  He is never had any liver issues he does drink regularly but has never had any seizures or hallucinations when he does not drink, he has not had any alcohol in approximately 1 week.  He is never seen a gastroenterologist.   12/05 CRRT initiated 12/06 Ileostomy, closure of abdominal wound 12/09 CRRT stopped 12/12 Lap for abscess, evacuation of hematoma 12/19 Code Blue, cardiac arrest, Intubated 12/20 IR drainage of LUQ abscess 12/24 Extubated, Palliative Care Consulted 12/25 Transferred to [facility] for LHC and R IJ temp venous pacer placement 12/31 diet advanced to full liquids 1/2 TPN stopped 1/4 CT scan results- resolution of LUQ fluid collection; drain removed, RLQ drain removed 1/5 RUQ jp drain removed 1/8  last drain removed by IR, transferred to floor  Case discussed with CWOCN prior to visit, who reports high ileostomy output and abdominal incision is healing nicely. She discussed symptoms of dehydration with pt.   Spoke with pt at bedside, who reports minimal intake. He complains of early satiety, reporting "my stomach has shrunk". Pt consumed most of spinach and a few bites of spaghetti for lunch. Meal completion 50-100%, however, suspect meal completion records may be inaccurate. Offered to downgrade diet consistency, however, pt politely declined.   Pt endorses wt loss since hospitalization. He reports UBW is around 165-170#. Noted pt has experienced a 14.6% wt loss over the past week and 16.5% wt loss over the past month, both of which are significant for time frame.   Discussed with pt importance of proper hydration to prevent dehydration. Also discussed importance of good meal and supplement intake to promote healing. Pt likes Ensure supplements, but states Boost Breeze is too sweet. Encouraged pt to continue to eat his meals and consume supplements.   Per CSW, plan for pt to d/c to [facility].   Labs reviewed.   NUTRITION - FOCUSED PHYSICAL EXAM:    Most Recent Value  Orbital Region  No depletion  Upper Arm Region  Mild depletion  Thoracic and Lumbar Region  Unable to assess  Buccal Region  Mild depletion  Temple Region  Moderate depletion  Clavicle Bone Region  Mild depletion  Clavicle and Acromion Bone Region  Moderate depletion  Scapular Bone Region  Moderate depletion  Dorsal Hand  Moderate depletion  Patellar Region  Moderate  depletion  Anterior Thigh Region  Moderate depletion  Posterior Calf Region  Moderate depletion  Edema (RD Assessment)  None  Hair  Reviewed  Eyes  Reviewed  Mouth  Reviewed  Skin  Reviewed  Nails  Reviewed       Diet Order:  DIET SOFT Room service appropriate? Yes; Fluid consistency: Thin  EDUCATION NEEDS:   Education needs have been  addressed  Skin:  Skin Assessment: Skin Integrity Issues: Skin Integrity Issues:: Incisions, Stage II Stage II: scrotum Incisions: abdominal/surgical  Last BM:  08/28/16 (250 ml via ileostomy)  Height:   Ht Readings from Last 1 Encounters:  08/19/17 5\' 10"  (1.778 m)    Weight:   Wt Readings from Last 1 Encounters:  08/27/17 146 lb 2.6 oz (66.3 kg)    Ideal Body Weight:  75.45 kg  BMI:  Body mass index is 20.97 kg/m.  Estimated Nutritional Needs:   Kcal:  2100-2300  Protein:  120-135 grams  Fluid:  > 2.1 L    Jaydrian Corpening A. Mayford KnifeWilliams, RD, LDN, CDE Pager: [phone] After hours Pager: [phone]

## 2017-08-28 NOTE — Progress Notes (Signed)
PROGRESS NOTE  Caleb LaundryFrederick Singh AOZ:[id number] DOB: Oct 06, 1955 DOA: 07/23/2017 PCP: Patient, No Pcp Per  HPI/Recap of past 24 hours: Caleb Singh 62 yo M with PMH significant for tobacco use disorder who presented on 07/23/17 with perforated distal small bowel and ischemia of cecum. POD #15 post  Exploratory laparotomy SB resection w/o anastomosis, placement of open wound vac system, ileostomy, closure of abdominal wall, drainage of abd abscess and evacuation of pelvic hematoma, RUQ drain.  Hospital course complicated by cardiac arrest, bacteremia, and acute renal failure, malnutrition and deconditioning.  Patient seen and examined with no family members at bedside. Denies abdominal pain. Reports high output from his right upper quadrant drain. On full liquid diet. Nutritionist consulted and following patient with recommendations to improve calorie protein malnutrition.  Assessment/Plan: Principal Problem:   Intestinal perforation s/p SB resection/ileostomy 07/24/2017 & 07/25/2017 Active Problems:   Free intraperitoneal air   AKI (acute kidney injury) (HCC)   Acute respiratory failure (HCC)   Ileostomy in place [facility](HCC)   Acute delirium   Tachypnea   Pressure injury of skin   Septic shock (HCC)   Bradycardia   Postoperative intra-abdominal abscess   Acute encephalopathy   DNR (do not resuscitate) discussion   Palliative care by specialist   Weakness generalized   Abdominopelvic abscess (HCC)  POD# 15 post exploratory laparotomy/ SB resection without anastomosis/RUQ drain -Surgery following -High output from RUQ drain -Follow up with Dr. Gaynelle AduEric Singh -c/w wet too dry dressing changes -cipro/flagyl 5-7 days post removal last drain 08/25/17 -Monitor ileostomy output; currently high output 1500 cc  AKI on CKD 3 -Baseline cr 1.7 -cr 2.09 -avoid nephrotoxic meds/hypotension -BMP am  Malnutrition/Deconditioning -Nutritionist following -ensure TID -encouraged oral intake  Anemia with  acute blood loss -Hg 7.4 -No sign of overt bleeding -CBC am   Code Status: DNR  Family Communication: no family members at bedside  Disposition Plan: will stay another midnight to continue antibiotics   Consultants:  surgery  Procedures:  Exploratory laparotory  Antimicrobials:  cipro/flagyl  DVT prophylaxis:  SCDs   Objective: Vitals:   08/27/17 1300 08/27/17 1418 08/27/17 2056 08/28/17 0444  BP:  (!) 182/98 (!) 152/99 (!) 144/96  Pulse: (!) 101 (!) 120 97 (!) 106  Resp: (!) 29 18 17 18   Temp:  97.9 F (36.6 C) 98.7 F (37.1 C) 98.2 F (36.8 C)  TempSrc:  Oral Oral Oral  SpO2: 100% 100% 100% 98%  Weight:      Height:        Intake/Output Summary (Last 24 hours) at 08/28/2017 1010 Last data filed at 08/28/2017 0947 Gross per 24 hour  Intake 900 ml  Output 4340 ml  Net -3440 ml   Filed Weights   08/24/17 0600 08/26/17 0500 08/27/17 0458  Weight: 68.7 kg (151 lb 7.3 oz) 65.2 kg (143 lb 11.8 oz) 66.3 kg (146 lb 2.6 oz)    Exam:   General:  62 yo AAM cachectic NAD A&O x3   Cardiovascular: RRR no rubs or gallops   Respiratory: CTA no wheezes or rales   Abdomen: surgical dressings and ileostomy drain with high output  Musculoskeletal: non focal no edema  Skin: as stated above. Surgical dressings  Psychiatry: Mood appropriate for condition and setting   Data Reviewed: CBC: Recent Labs  Lab 08/22/17 0400 08/23/17 0401 08/24/17 0437 08/25/17 0340 08/26/17 0339  WBC 14.9* 11.8* 10.1 10.3 10.5  HGB 7.1* 7.3* 7.3* 7.3* 7.8*  HCT 24.1* 23.1* 23.8* 24.0* 25.9*  MCV 92.3 92.4 92.2 92.0 92.5  PLT 441* 441* 440* 478* 505*   Basic Metabolic Panel: Recent Labs  Lab 08/22/17 0400 08/23/17 0401 08/24/17 0437 08/25/17 0340 08/26/17 0339 08/28/17 0428  NA 132* 137 139 140 141 138  K 3.7 3.6 3.6 3.9 4.0 4.0  CL 101 107 106 112* 112* 109  CO2 22 23 24 23 22  19*  GLUCOSE 295* 198* 97 92 101* 116*  BUN 38* 37* 34* 30* 29* 40*  CREATININE 1.97*  2.06* 2.12* 2.09* 1.91* 2.09*  CALCIUM 7.7* 8.0* 8.2* 8.3* 8.4* 9.0  MG 1.9  --   --   --   --   --   PHOS 3.7  --   --   --   --   --    GFR: Estimated Creatinine Clearance: 33.9 mL/min (A) (by C-G formula based on SCr of 2.09 mg/dL (H)). Liver Function Tests: Recent Labs  Lab 08/22/17 0400  AST 112*  ALT 231*  ALKPHOS 207*  BILITOT 1.3*  PROT 7.1  ALBUMIN 1.7*   No results for input(s): LIPASE, AMYLASE in the last 168 hours. No results for input(s): AMMONIA in the last 168 hours. Coagulation Profile: No results for input(s): INR, PROTIME in the last 168 hours. Cardiac Enzymes: No results for input(s): CKTOTAL, CKMB, CKMBINDEX, TROPONINI in the last 168 hours. BNP (last 3 results) No results for input(s): PROBNP in the last 8760 hours. HbA1C: Recent Labs    08/26/17 0339  HGBA1C 5.3   CBG: Recent Labs  Lab 08/24/17 0816 08/24/17 1148 08/24/17 1616 08/24/17 2355 08/25/17 0812  GLUCAP 97 144* 119* 91 88   Lipid Profile: No results for input(s): CHOL, HDL, LDLCALC, TRIG, CHOLHDL, LDLDIRECT in the last 72 hours. Thyroid Function Tests: No results for input(s): TSH, T4TOTAL, FREET4, T3FREE, THYROIDAB in the last 72 hours. Anemia Panel: No results for input(s): VITAMINB12, FOLATE, FERRITIN, TIBC, IRON, RETICCTPCT in the last 72 hours. Urine analysis:    Component Value Date/Time   COLORURINE YELLOW 07/30/2017 0836   APPEARANCEUR CLOUDY (A) 07/30/2017 0836   LABSPEC 1.012 07/30/2017 0836   PHURINE 5.0 07/30/2017 0836   GLUCOSEU NEGATIVE 07/30/2017 0836   HGBUR MODERATE (A) 07/30/2017 0836   BILIRUBINUR NEGATIVE 07/30/2017 0836   KETONESUR NEGATIVE 07/30/2017 0836   PROTEINUR NEGATIVE 07/30/2017 0836   NITRITE NEGATIVE 07/30/2017 0836   LEUKOCYTESUR NEGATIVE 07/30/2017 0836   Sepsis Labs: @LABRCNTIP (procalcitonin:4,lacticidven:4)  )No results found for this or any previous visit (from the past 240 hour(s)).    Studies: No results found.  Scheduled  Meds: . amLODipine  5 mg Oral Daily  . chlorhexidine  15 mL Mouth Rinse BID  . Chlorhexidine Gluconate Cloth  6 each Topical Daily  . ciprofloxacin  500 mg Oral BID  . feeding supplement  1 Container Oral BID BM  . feeding supplement (ENSURE ENLIVE)  237 mL Oral TID BM  . heparin  5,000 Units Subcutaneous Q8H  . hydrALAZINE  25 mg Oral Q8H  . mouth rinse  15 mL Mouth Rinse q12n4p  . metroNIDAZOLE  500 mg Oral Q12H  . sodium chloride flush  10-40 mL Intracatheter Q12H    Continuous Infusions:   LOS: 36 days     Darlin Drop, MD [facility] Pager [phone]  If 7PM-7AM, please contact night-coverage [email or web address] Password [email or web address] 08/28/2017, 10:10 AM

## 2017-08-28 NOTE — Consult Note (Signed)
WOC Nurse ostomy follow up Stoma type/location: RLQ, ileostomy Stomal assessment/size: slightly oval shaped 1 1/4" x 1 3/8", budded, pink and moist Peristomal assessment: muocutaneous separation from 6-12 oclock, but improved, no drainage or purulence from this site   Treatment options for stomal/peristomal skin: Using 2" barrier ring to cover peristomal separation and protect skin from output Output HIGH VOLUMES; emptied 250cc this am around 11am, emptied 500cc just before pouch change  Ostomy pouching: 2pc 2 3/4" placed; high output pouch with spout  Education provided:  This patient has just now become ready for teaching, he has been so sick previously  Explained creation of stoma; this was the first time he really paid much attention to the pouch change  Explained stoma characteristics (budded, flush, color, texture, care) Demonstrated pouch change (cutting new wafer, measuring stoma, cleaning peristomal skin and stoma, use of barrier ring) Education on emptying when 1/3 to 1/2 full and how to empty Demonstrated opening and closing spout; patient was able to perform this task for the WOC nurse  Discussed risk for dehydration and ways to prevent  He is planning to go to SNF at DC.  I will contact his sister per his request to obtain her address in order to [facility].   SNF should consider hooking pouch to nightime drainage to prevent leakage and overfilling of pouch. However not rely on drainage bag when patient is learning care.    [place] in [place] [facility] [place]: Yes  WOC Nurse will follow along with you for continued support with ostomy teaching and care Lucynda Rosano [facility] MSN, RN, [place], CNS, [place] [phone]

## 2017-08-28 NOTE — Progress Notes (Addendum)
2:50pm [name] able to make LOG bed offer on patient- pt agreeable  CSW informed MD who plans to DC tomorrow- facility updated and pt sister updated at pt request  10am CSW spoke with supervisor concerning plan for pt rehab.  Suggested CS[facility] [place] [facility]- they have [facility] beds and [place] have [place] which could take a long time  Supervisor approved 2 week LOG for patient since no VA options available and pt stable for DC- CSW faxed to local LOG facilities and called admissions to inform of referral- awaiting responses.  Burna SisJenna H. Larnell Granlund, LCSW Clinical Social Worker [phone]

## 2017-08-28 NOTE — Discharge Instructions (Signed)
MIDLINE WOUND CARE: °- midline dressing to be changed twice daily °- supplies: sterile saline, kerlix, scissors, ABD pads, tape  °- remove dressing and all packing carefully, moistening with sterile saline as needed to avoid packing/internal dressing sticking to the wound. °- clean edges of skin around the wound with water/gauze, making sure there is no tape debris or leakage left on skin that could cause skin irritation or breakdown. °- dampen and clean kerlix with sterile saline and pack wound from wound base to skin level, making sure to take note of any possible areas of wound tracking, tunneling and packing appropriately. Wound can be packed loosely. Trim kerlix to size if a whole kerlix is not required. °- cover wound with a dry ABD pad and secure with tape.  °- write the date/time on the dry dressing/tape to better track when the last dressing change occurred. °- change dressing as needed if leakage occurs, wound gets contaminated, or patient requests to shower. °- patient may shower daily with wound open and following the shower the wound should be dried and a clean dressing placed.  ° ° °CC[facility]      [facility] °[phone] ° °OPEN ABDOMINAL SURGERY: POST OP INSTRUCTIONS ° °Always review your discharge instruction sheet given to you by the facility where your surgery was performed. ° °IF YOU HAVE DISABILITY OR FAMILY LEAVE FORMS, YOU MUST BRING THEM TO THE OFFICE FOR PROCESSING.  PLEASE DO NOT GIVE THEM TO YOUR DOCTOR. ° °1. A prescription for pain medication may be given to you upon discharge.  Take your pain medication as prescribed, if needed.  If narcotic pain medicine is not needed, then you may take acetaminophen (Tylenol) or ibuprofen (Advil) as needed. °2. Take your usually prescribed medications unless otherwise directed. °3. If you need a refill on your pain medication, please contact your pharmacy. They will contact our office to request authorization.  Prescriptions will not be  filled after 5pm or on week-ends. °4. You should follow a light diet the first few days after arrival home, such as soup and crackers, pudding, etc.unless your doctor has advised otherwise. A high-fiber, low fat diet can be resumed as tolerated.   Be sure to include lots of fluids daily. Most patients will experience some swelling and bruising on the chest and neck area.  Ice packs will help.  Swelling and bruising can take several days to resolve °5. Most patients will experience some swelling and bruising in the area of the incision. Ice pack will help. Swelling and bruising can take several days to resolve..  °6. It is common to experience some constipation if taking pain medication after surgery.  Increasing fluid intake and taking a stool softener will usually help or prevent this problem from occurring.  A mild laxative (Milk of Magnesia or Miralax) should be taken according to package directions if there are no bowel movements after 48 hours. °7.  You may have steri-strips (small skin tapes) in place directly over the incision.  These strips should be left on the skin for 7-10 days.  If your surgeon used skin glue on the incision, you may shower in 24 hours.  The glue will flake off over the next 2-3 weeks.  Any sutures or staples will be removed at the office during your follow-up visit. You may find that a light gauze bandage over your incision may keep your staples from being rubbed or pulled. You may shower and replace the bandage daily. °8. ACTIVITIES:    You may resume regular (light) daily activities beginning the next day--such as daily self-care, walking, climbing stairs--gradually increasing activities as tolerated.  You may have sexual intercourse when it is comfortable.  Refrain from any heavy lifting or straining until approved by your doctor. °a. You may drive when you no longer are taking prescription pain medication, you can comfortably wear a seatbelt, and you can safely maneuver your car and  apply brakes °b. Return to Work: ___________________________________ °9. You should see your doctor in the office for a follow-up appointment approximately two weeks after your surgery.  Make sure that you call for this appointment within a day or two after you arrive home to insure a convenient appointment time. °OTHER INSTRUCTIONS:  °_____________________________________________________________ °_____________________________________________________________ ° °WHEN TO CALL YOUR DOCTOR: °1. Fever over 101.0 °2. Inability to urinate °3. Nausea and/or vomiting °4. Extreme swelling or bruising °5. Continued bleeding from incision. °6. Increased pain, redness, or drainage from the incision. °7. Difficulty swallowing or breathing °8. Muscle cramping or spasms. °9. Numbness or tingling in hands or feet or around lips. ° °The clinic staff is available to answer your questions during regular business hours.  Please don’t hesitate to call and ask to speak to one of the nurses if you have concerns. ° °For further questions, please visit [email or web address] ° ° ° °

## 2017-08-28 NOTE — Progress Notes (Signed)
Physical Therapy Treatment Patient Details Name: Caleb Singh MRN: [id number] DOB: 1955/11/04 Today's Date: 08/28/2017    History of Present Illness Pt is a 62 yo male admitted 07/23/17 with intestinal perforation. Now s/p resection & ileostomy on 12/5 and 12/6; s/p abscess draining 12/12. VDRF. A fib with RVR. ETT 12/20 >> 12/24. 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 PMH includes ETOH abuse, PE.     PT Comments    Patient received in bed, pleasant and willing to participate with skilled PT services today. He is able to perform all functional mobility and transfers with min guard in general however continues to be limited by fatigue today. Education and encouragement provided throughout session; note mild unsteadiness with RW however patient is able to self-correct with no more than min guard from PT. He was left up in the chair with chair alarm activated, all needs otherwise met.     Follow Up Recommendations  SNF     Equipment Recommendations  Other (comment)(TBA )    Recommendations for Other Services OT consult     Precautions / Restrictions Precautions Precautions: Fall Precaution Comments: Multiple lines; multiple JP drains, temporary pacer Restrictions Weight Bearing Restrictions: No    Mobility  Bed Mobility Overal bed mobility: Needs Assistance Bed Mobility: Supine to Sit     Supine to sit: Min guard     General bed mobility comments: close min guard for safety, patient now able to elevate/power up trunk at EOB   Transfers Overall transfer level: Needs assistance Equipment used: Rolling walker (2 wheeled) Transfers: Sit to/from Stand Sit to Stand: Min guard         General transfer comment: close min guard/VC for sequencing and safety   Ambulation/Gait Ambulation/Gait assistance: Min guard Ambulation Distance (Feet): 45 Feet(inside the room ) Assistive device: Rolling walker (2 wheeled) Gait  Pattern/deviations: Step-through pattern;Decreased step length - right;Decreased step length - left;Shuffle;Trunk flexed     General Gait Details: limited by fatigue, balance has improved however    Stairs            Wheelchair Mobility    Modified Rankin (Stroke Patients Only)       Balance Overall balance assessment: Needs assistance Sitting-balance support: Feet supported;No upper extremity supported Sitting balance-Leahy Scale: Fair     Standing balance support: Bilateral upper extremity supported;During functional activity Standing balance-Leahy Scale: Fair                              Cognition Arousal/Alertness: Awake/alert Behavior During Therapy: WFL for tasks assessed/performed Overall Cognitive Status: Within Functional Limits for tasks assessed                                        Exercises      General Comments        Pertinent Vitals/Pain Pain Assessment: Faces Faces Pain Scale: No hurt Pain Intervention(s): Monitored during session;Limited activity within patient's tolerance    Home Living                      Prior Function            PT Goals (current goals can now be found in the care plan section) Acute Rehab PT Goals Patient Stated Goal: to regain PLOF PT Goal Formulation:  With patient Time For Goal Achievement: 09/09/17 Potential to Achieve Goals: Good Progress towards PT goals: Progressing toward goals    Frequency    Min 2X/week      PT Plan Current plan remains appropriate    Co-evaluation              AM-PAC PT "6 Clicks" Daily Activity  Outcome Measure  Difficulty turning over in bed (including adjusting bedclothes, sheets and blankets)?: Unable Difficulty moving from lying on back to sitting on the side of the bed? : Unable Difficulty sitting down on and standing up from a chair with arms (e.g., wheelchair, bedside commode, etc,.)?: Unable Help needed moving to and  from a bed to chair (including a wheelchair)?: A Little Help needed walking in hospital room?: A Little Help needed climbing 3-5 steps with a railing? : A Lot 6 Click Score: 11    End of Session   Activity Tolerance: Patient tolerated treatment well;Patient limited by fatigue Patient left: in chair;with chair alarm set;with call bell/phone within reach Nurse Communication: Mobility status PT Visit Diagnosis: Muscle weakness (generalized) (M62.81);Other abnormalities of gait and mobility (R26.89)     Time: 1000-1027 PT Time Calculation (min) (ACUTE ONLY): 27 min  Charges:  $Gait Training: 8-22 mins $Therapeutic Activity: 8-22 mins                    G Codes:  Functional Assessment Tool Used: AM-PAC 6 Clicks Basic Mobility;Clinical judgement    Deniece Ree PT, DPT, CBIS  Supplemental Physical Therapist [place]   Pager [phone]

## 2017-08-29 ENCOUNTER — Inpatient Hospital Stay (HOSPITAL_COMMUNITY): Payer: Non-veteran care

## 2017-08-29 DIAGNOSIS — R0603 Acute respiratory distress: Secondary | ICD-10-CM

## 2017-08-29 DIAGNOSIS — E43 Unspecified severe protein-calorie malnutrition: Secondary | ICD-10-CM

## 2017-08-29 LAB — CBC
HCT: 29.7 % — ABNORMAL LOW (ref 39.0–52.0)
Hemoglobin: 9.2 g/dL — ABNORMAL LOW (ref 13.0–17.0)
MCH: 28.6 pg (ref 26.0–34.0)
MCHC: 31 g/dL (ref 30.0–36.0)
MCV: 92.2 fL (ref 78.0–100.0)
PLATELETS: 509 10*3/uL — AB (ref 150–400)
RBC: 3.22 MIL/uL — ABNORMAL LOW (ref 4.22–5.81)
RDW: 16 % — ABNORMAL HIGH (ref 11.5–15.5)
WBC: 13.4 10*3/uL — ABNORMAL HIGH (ref 4.0–10.5)

## 2017-08-29 LAB — BASIC METABOLIC PANEL
Anion gap: 9 (ref 5–15)
Anion gap: 8 (ref 5–15)
BUN: 45 mg/dL — AB (ref 6–20)
BUN: 42 mg/dL — AB (ref 6–20)
CHLORIDE: 103 mmol/L (ref 101–111)
CO2: 20 mmol/L — ABNORMAL LOW (ref 22–32)
CO2: 19 mmol/L — AB (ref 22–32)
Calcium: 8.9 mg/dL (ref 8.9–10.3)
Calcium: 8.2 mg/dL — ABNORMAL LOW (ref 8.9–10.3)
Chloride: 109 mmol/L (ref 101–111)
Creatinine, Ser: 2.23 mg/dL — ABNORMAL HIGH (ref 0.61–1.24)
Creatinine, Ser: 1.97 mg/dL — ABNORMAL HIGH (ref 0.61–1.24)
GFR calc Af Amer: 34 mL/min — ABNORMAL LOW (ref >60)
GFR calc Af Amer: 40 mL/min — ABNORMAL LOW (ref >60)
GFR calc non Af Amer: 30 mL/min — ABNORMAL LOW (ref >60)
GFR calc non Af Amer: 34 mL/min — ABNORMAL LOW (ref >60)
GLUCOSE: 103 mg/dL — AB (ref 65–99)
GLUCOSE: 101 mg/dL — AB (ref 65–99)
POTASSIUM: 4.3 mmol/L (ref 3.5–5.1)
POTASSIUM: 3.9 mmol/L (ref 3.5–5.1)
Sodium: 138 mmol/L (ref 135–145)
Sodium: 130 mmol/L — ABNORMAL LOW (ref 135–145)

## 2017-08-29 LAB — TOTAL BILIRUBIN, BODY FLUID

## 2017-08-29 MED ORDER — ALTEPLASE 2 MG IJ SOLR
2.0000 mg | Freq: Once | INTRAMUSCULAR | Status: AC
  Administered 2017-08-29: 2 mg

## 2017-08-29 MED ORDER — SODIUM CHLORIDE 0.9 % IV BOLUS (SEPSIS)
500.0000 mL | Freq: Once | INTRAVENOUS | Status: AC
  Administered 2017-08-29: 500 mL via INTRAVENOUS

## 2017-08-29 MED ORDER — FLUTICASONE PROPIONATE 50 MCG/ACT NA SUSP
2.0000 | Freq: Every day | NASAL | Status: AC
  Administered 2017-08-29: 2 via NASAL
  Filled 2017-08-29: qty 16

## 2017-08-29 MED ORDER — SODIUM CHLORIDE 0.9 % IV SOLN
INTRAVENOUS | Status: DC
  Administered 2017-08-29 – 2017-08-30 (×3): via INTRAVENOUS

## 2017-08-29 NOTE — Progress Notes (Signed)
[facility] Progress Note  16 Days Post-Op  Subjective: CC-  Sitting up eating breakfast. States that he still feels weak, but overall doing better. Tolerating diet. Denies n/v. Has good output from ileostomy. WBC up a little today 13.4, but he is afebrile and denies any new symptoms including cough, SOB, dysuria, abdominal pain.  Possibly going to SNF today.  Objective: Vital signs in last 24 hours: Temp:  [98.2 F (36.8 C)-98.6 F (37 C)] 98.2 F (36.8 C) (01/10 0623) Pulse Rate:  [100-103] 103 (01/10 0623) Resp:  [16-18] 16 (01/10 0623) BP: (128-140)/(89-95) 134/95 (01/10 0623) SpO2:  [97 %-100 %] 100 % (01/10 0623) Weight:  [142 lb 3.2 oz (64.5 kg)] 142 lb 3.2 oz (64.5 kg) (01/10 0500) Last BM Date: 08/28/17(thru ileostomy)  Intake/Output from previous day: 01/09 0701 - 01/10 0700 In: 1200 [P.O.:1200] Out: 2775 [Urine:975; Stool:1800] Intake/Output this shift: No intake/output data recorded.  PE: Gen:  Alert, NAD, pleasant HEENT: EOM's intact, pupils equal and round Card:  RRR, no M/G/R heard Pulm:  CTAB, no W/R/R, effort normal Abd: Soft, ND, NT, +BS, open midline incision with sutures visible/some fibrinous exudate at the base but good granulation tissue at the edges, ileostomy pink and functioning Ext:  Calves soft and nontender Skin: no rashes noted, warm and dry   Lab Results:  Recent Labs    08/29/17 0437  WBC 13.4*  HGB 9.2*  HCT 29.7*  PLT 509*   BMET Recent Labs    08/28/17 0428 08/29/17 0437  NA 138 138  K 4.0 4.3  CL 109 109  CO2 19* 20*  GLUCOSE 116* 103*  BUN 40* 45*  CREATININE 2.09* 2.23*  CALCIUM 9.0 8.9   PT/INR No results for input(s): LABPROT, INR in the last 72 hours. CMP     Component Value Date/Time   NA 138 08/29/2017 0437   K 4.3 08/29/2017 0437   CL 109 08/29/2017 0437   CO2 20 (L) 08/29/2017 0437   GLUCOSE 103 (H) 08/29/2017 0437   BUN 45 (H) 08/29/2017 0437   CREATININE 2.23 (H) 08/29/2017 0437   CALCIUM 8.9 08/29/2017 0437   PROT 7.1 08/22/2017 0400   ALBUMIN 1.7 (L) 08/22/2017 0400   AST 112 (H) 08/22/2017 0400   ALT 231 (H) 08/22/2017 0400   ALKPHOS 207 (H) 08/22/2017 0400   BILITOT 1.3 (H) 08/22/2017 0400   GFRNONAA 30 (L) 08/29/2017 0437   GFRAA 34 (L) 08/29/2017 0437   Lipase     Component Value Date/Time   LIPASE 47 07/23/2017 1455       Studies/Results: No results found.  Anti-infectives: Anti-infectives (From admission, onward)   Start     Dose/Rate Route Frequency Ordered Stop   08/25/17 1200  ciprofloxacin (CIPRO) tablet 500 mg     500 mg Oral 2 times daily 08/25/17 1154     08/25/17 1200  metroNIDAZOLE (FLAGYL) tablet 500 mg     500 mg Oral Every 12 hours 08/25/17 1154     08/10/17 0500  vancomycin (VANCOCIN) 1,500 mg in sodium chloride 0.9 % 500 mL IVPB  Status:  Discontinued     1,500 mg 250 mL/hr over 120 Minutes Intravenous Every 48 hours 08/08/17 0728 08/12/17 1018   08/08/17 2200  anidulafungin (ERAXIS) 100 mg in sodium chloride 0.9 % 100 mL IVPB  Status:  Discontinued     100 mg 78 mL/hr over 100 Minutes Intravenous Every 24 hours 08/08/17 0524 08/12/17 1018   08/08/17 0445  vancomycin (  VANCOCIN) 1,500 mg in sodium chloride 0.9 % 500 mL IVPB     1,500 mg 250 mL/hr over 120 Minutes Intravenous  Once 08/08/17 0431 08/08/17 0659   08/08/17 0430  vancomycin (VANCOCIN) 1,250 mg in sodium chloride 0.9 % 250 mL IVPB  Status:  Discontinued     1,250 mg 166.7 mL/hr over 90 Minutes Intravenous  Once 08/08/17 0418 08/08/17 0428   08/08/17 0415  anidulafungin (ERAXIS) 200 mg in sodium chloride 0.9 % 200 mL IVPB     200 mg 78 mL/hr over 200 Minutes Intravenous  Once 08/08/17 0413 08/08/17 0816   08/05/17 1000  piperacillin-tazobactam (ZOSYN) IVPB 3.375 g  Status:  Discontinued     3.375 g 12.5 mL/hr over 240 Minutes Intravenous Every 8 hours 08/05/17 0946 08/24/17 0945   08/02/17 1400  meropenem (MERREM) 1 g in sodium chloride 0.9 % 100 mL IVPB  Status:   Discontinued     1 g 200 mL/hr over 30 Minutes Intravenous Every 12 hours 08/02/17 1111 08/05/17 0945   07/29/17 0600  piperacillin-tazobactam (ZOSYN) IVPB 3.375 g  Status:  Discontinued     3.375 g 12.5 mL/hr over 240 Minutes Intravenous Every 8 hours 07/29/17 0518 08/02/17 1036   07/25/17 2000  vancomycin (VANCOCIN) IVPB 1000 mg/200 mL premix  Status:  Discontinued     1,000 mg 200 mL/hr over 60 Minutes Intravenous Every 48 hours 07/23/17 2042 07/24/17 0956   07/24/17 2200  piperacillin-tazobactam (ZOSYN) 3.375 g in dextrose 5 % 50 mL IVPB  Status:  Discontinued     3.375 g 100 mL/hr over 30 Minutes Intravenous Every 6 hours 07/24/17 2006 07/29/17 0518   07/24/17 2100  anidulafungin (ERAXIS) 100 mg in sodium chloride 0.9 % 100 mL IVPB  Status:  Discontinued     100 mg 78 mL/hr over 100 Minutes Intravenous Every 24 hours 07/23/17 2039 08/05/17 0945   07/24/17 0000  anidulafungin (ERAXIS) 100 mg in sodium chloride 0.9 % 100 mL IVPB  Status:  Discontinued     100 mg 78 mL/hr over 100 Minutes Intravenous Every 24 hours 07/23/17 2038 07/23/17 2039   07/23/17 2200  piperacillin-tazobactam (ZOSYN) IVPB 3.375 g  Status:  Discontinued     3.375 g 12.5 mL/hr over 240 Minutes Intravenous Every 8 hours 07/23/17 2043 07/24/17 2006   07/23/17 2100  anidulafungin (ERAXIS) 200 mg in sodium chloride 0.9 % 200 mL IVPB     200 mg 78 mL/hr over 200 Minutes Intravenous  Once 07/23/17 2038 07/24/17 0239   07/23/17 2100  vancomycin (VANCOCIN) 1,500 mg in sodium chloride 0.9 % 500 mL IVPB     1,500 mg 250 mL/hr over 120 Minutes Intravenous  Once 07/23/17 2041 07/23/17 2317   07/23/17 1515  piperacillin-tazobactam (ZOSYN) IVPB 3.375 g     3.375 g 100 mL/hr over 30 Minutes Intravenous  Once 07/23/17 1511 07/23/17 1611       Assessment/Plan Perforated distal small boweland ischemia of cecum 1.S/Pexploratory laparotomy, small bowel resection without anastomosis, placement of open wound VAC system  07/23/17 Dr. Gaynelle Adu 2. S/p Reexploration of abdomen, ileocecectomy, creation of end ileostomy,closure of abdominal wall, 07/25/17, Dr. Almond Lint 3. Exploratory laparotomy, drainage of abdominal abscess and evacuation of pelvic hematoma, RUQ drain, and left lateral drain placement, 07/31/17, Dr. Glenna Fellows (Findings:Subdiaphragmatic and subhepatic abscesses.Large organizing pelvic hematoma. Apparent necrotic tissue left lobe of the liver.) Bacteremia - Enterobacteriaceae + E Coli Cardiac arrest - Code Blue 08/08/17  Acute renal failure-CRRT discontinued 07/27/17  Malnutrition/Deconditioning - taking in soft diet (due to issues with dentition), and Ensure GI-needs continued dressing changes, wound isdehisced but seems to be scarred into place. High risk for hernia, lower risk for evisceration IRdrained collection posterior to anastomosis- drains removed HY:QMVHQ and flagyl 1/6>> (planning 5-7 days oral antibiotics post removal of last drain) ION:GEXB - Heparin Follow-up:Dr. Gaynelle Adu  Plan: Patient has no abdominal pain or n/v. He is tolerating a diet and having good output from ileostomy. Ready for d/c to SNF from surgical standpoint. Continue BID wet to dry dressing changes. Cipro/flagyl x5-7 days post removal last drain 1/6.    LOS: 37 days    Franne Forts , [facility] 08/29/2017, 8:37 AM Pager: [phone] Consults: [phone] Mon-Fri 7:00 am-4:30 pm Sat-Sun 7:00 am-11:30 am

## 2017-08-29 NOTE — Clinical Social Work Placement (Signed)
   CLINICAL SOCIAL WORK PLACEMENT  NOTE  Date:  08/29/2017  Patient Details  Name: Caleb LaundryFrederick Wilkowski MRN: [id number] Date of Birth: 09-Mar-1956  Clinical Social Work is seeking post-discharge placement for this patient at the Skilled  Nursing Facility level of care (*CSW will initial, date and re-position this form in  chart as items are completed):  Yes   Patient/family provided with [facility] Clinical Social Work Department's list of facilities offering this level of care within the geographic area requested by the patient (or if unable, by the patient's family).  Yes   Patient/family informed of their freedom to choose among providers that offer the needed level of care, that participate in Medicare, Medicaid or managed care program needed by the patient, have an available bed and are willing to accept the patient.  Yes   Patient/family informed of [facility] ownership interest [facility] and [facility], as well as of the fact that they are under no obligation to receive care at these facilities.  PASRR submitted to EDS on 08/23/17     PASRR number received on 08/23/17     Existing PASRR number confirmed on       FL2 transmitted to all facilities in geographic area requested by pt/family on 08/23/17     FL2 transmitted to all facilities within larger geographic area on       Patient informed that his/her managed care company has contracts with or will negotiate with certain facilities, including the following:        Yes   Patient/family informed of bed offers received.  [facility]     Physician recommends and patient chooses bed at      [facility].  Patient to be transferred to facility by [facility]AR     Patient family notified on 08/29/17 of transfer.  Name of family member notified:  Gorden HarmsDesiree Davis [phone], sister      PHYSICIAN Please sign FL2, Please sign DNR     Additional Comment:    _______________________________________________ Doy HutchingIsabel H Satya Bohall, LCSWA 08/29/2017, 11:03 AM

## 2017-08-29 NOTE — Progress Notes (Signed)
Patient ID: Caleb Singh, male   DOB: 05-29-1956, 62 y.o.   MRN: [id number]  This NP visited patient at the bedside as a follow up for palliative medicine needs and emotional support.  We continued discussion regarding diagnosis and long term GOCs. He verbalizes his worry that he will "never be the same" and what that means to him.  He doesn't want to be a burden on his family.  Emotional support offered.  We explored his feelings on mortality and the impact of his strong faith on his overall weelness  Plan is to disposition to a  SNF for short term rehabilitation, he remains hopeful   Discussed with patient the importance of continued conversation with family and their  medical providers regarding overall plan of care and treatment options,  ensuring decisions are within the context of the patients values and GOCs.  Education offered on importance of mobility and nutrition and tips to include these  in daily life.  Questions and concerns addressed  Time in   1700        Time out    1735      Total time spent on the unit was 35 minutes  Greater than 50% of the time was spent in counseling and coordination of care  Lorinda CreedMary Larach NP  Palliative Medicine Team Team Phone # [phone] Pager [phone]

## 2017-08-29 NOTE — Progress Notes (Signed)
PROGRESS NOTE  Caleb Singh BJY:[id number] DOB: [birth year] DOA: 07/23/2017 PCP: Patient, No Pcp Per  HPI/Recap of past 24 hours: Mr Caleb Singh 62 yo M with PMH significant for tobacco use disorder who presented on 07/23/17 with perforated distal small bowel and ischemia of cecum. POD #15 post  Exploratory laparotomy SB resection w/o anastomosis, placement of open wound vac system, ileostomy, closure of abdominal wall, drainage of abd abscess and evacuation of pelvic hematoma, RUQ drain.  Hospital course complicated by cardiac arrest, bacteremia, and acute renal failure, malnutrition and deconditioning.  Seen and examined at his bedside. Hypovolemic with AKI. IV fluid 500 cc NS bolus given. Restarted IV fluid NS at 75cc/hr. Repeat BMP am.   Assessment/Plan: Principal Problem:   Intestinal perforation s/p SB resection/ileostomy 07/24/2017 & 07/25/2017 Active Problems:   Free intraperitoneal air   AKI (acute kidney injury) (HCC)   Acute respiratory failure (HCC)   Ileostomy in place [facility])   Acute delirium   Tachypnea   Pressure injury of skin   Septic shock (HCC)   Bradycardia   Postoperative intra-abdominal abscess   Acute encephalopathy   DNR (do not resuscitate) discussion   Palliative care by specialist   Weakness generalized   Abdominopelvic abscess (HCC)   Protein-calorie malnutrition, severe  POD# 16 post exploratory laparotomy/ SB resection without anastomosis/RUQ drain -Surgery following -High output from RUQ drain -Follow up with Dr. Gaynelle Singh -c/w wet too dry dressing changes -cipro/flagyl 5-7 days post removal last drain 08/25/17 -Monitor ileostomy output;  -output improving from1800 cc to 900 cc  AKI on CKD 3, most likely prerenal from dehydration -Baseline cr 1.7 -cr 2.23 from 2.09 -avoid nephrotoxic meds/hypotension -IV fluid bolus NS at 500cc/hr -IV fluid 75cc/hr NS -BMP am  Malnutrition/Deconditioning -Nutritionist following -ensure TID -encouraged  oral intake  Anemia with acute blood loss -Hg 9.2  -No sign of overt bleeding -CBC am  Dehydration -management as stated above -encouraged to increase po intake  Debilitating -PT evaluate and treat   Code Status: DNR  Family Communication: no family members at bedside  Disposition Plan: will stay another midnight to continue antibiotics   Consultants:  surgery  Procedures:  Exploratory laparotory  Antimicrobials:  cipro/flagyl  DVT prophylaxis:  SCDs- heparin 5000 u TID   Objective: Vitals:   08/28/17 1524 08/28/17 2034 08/29/17 0500 08/29/17 0623  BP: 140/89 128/89  (!) 134/95  Pulse: 100 (!) 101  (!) 103  Resp: 18 18  16   Temp: 98.5 F (36.9 C) 98.6 F (37 C)  98.2 F (36.8 C)  TempSrc: Oral Oral  Oral  SpO2: 99% 97%  100%  Weight:   64.5 kg (142 lb 3.2 oz)   Height:        Intake/Output Summary (Last 24 hours) at 08/29/2017 0826 Last data filed at 08/29/2017 0865 Gross per 24 hour  Intake 1080 ml  Output 2325 ml  Net -1245 ml   Filed Weights   08/26/17 0500 08/27/17 0458 08/29/17 0500  Weight: 65.2 kg (143 lb 11.8 oz) 66.3 kg (146 lb 2.6 oz) 64.5 kg (142 lb 3.2 oz)    Exam:   General:  62 yo AAM cachectic NAD A&O x3. Mucous membranes dry.  Cardiovascular: RRR no rubs or gallops   Respiratory: CTA no wheezes or rales   Abdomen: surgical dressings and ileostomy drain with high output  Musculoskeletal: non focal no edema  Skin: as stated above. Surgical dressings  Psychiatry: Mood appropriate for condition and setting   Data  Reviewed: CBC: Recent Labs  Lab 08/23/17 0401 08/24/17 0437 08/25/17 0340 08/26/17 0339 08/29/17 0437  WBC 11.8* 10.1 10.3 10.5 13.4*  HGB 7.3* 7.3* 7.3* 7.8* 9.2*  HCT 23.1* 23.8* 24.0* 25.9* 29.7*  MCV 92.4 92.2 92.0 92.5 92.2  PLT 441* 440* 478* 505* 509*   Basic Metabolic Panel: Recent Labs  Lab 08/24/17 0437 08/25/17 0340 08/26/17 0339 08/28/17 0428 08/29/17 0437  NA 139 140 141 138 138    K 3.6 3.9 4.0 4.0 4.3  CL 106 112* 112* 109 109  CO2 24 23 22  19* 20*  GLUCOSE 97 92 101* 116* 103*  BUN 34* 30* 29* 40* 45*  CREATININE 2.12* 2.09* 1.91* 2.09* 2.23*  CALCIUM 8.2* 8.3* 8.4* 9.0 8.9   GFR: Estimated Creatinine Clearance: 30.9 mL/min (A) (by C-G formula based on SCr of 2.23 mg/dL (H)). Liver Function Tests: No results for input(s): AST, ALT, ALKPHOS, BILITOT, PROT, ALBUMIN in the last 168 hours. No results for input(s): LIPASE, AMYLASE in the last 168 hours. No results for input(s): AMMONIA in the last 168 hours. Coagulation Profile: No results for input(s): INR, PROTIME in the last 168 hours. Cardiac Enzymes: No results for input(s): CKTOTAL, CKMB, CKMBINDEX, TROPONINI in the last 168 hours. BNP (last 3 results) No results for input(s): PROBNP in the last 8760 hours. HbA1C: No results for input(s): HGBA1C in the last 72 hours. CBG: Recent Labs  Lab 08/24/17 0816 08/24/17 1148 08/24/17 1616 08/24/17 2355 08/25/17 0812  GLUCAP 97 144* 119* 91 88   Lipid Profile: No results for input(s): CHOL, HDL, LDLCALC, TRIG, CHOLHDL, LDLDIRECT in the last 72 hours. Thyroid Function Tests: No results for input(s): TSH, T4TOTAL, FREET4, T3FREE, THYROIDAB in the last 72 hours. Anemia Panel: No results for input(s): VITAMINB12, FOLATE, FERRITIN, TIBC, IRON, RETICCTPCT in the last 72 hours. Urine analysis:    Component Value Date/Time   COLORURINE YELLOW 07/30/2017 0836   APPEARANCEUR CLOUDY (A) 07/30/2017 0836   LABSPEC 1.012 07/30/2017 0836   PHURINE 5.0 07/30/2017 0836   GLUCOSEU NEGATIVE 07/30/2017 0836   HGBUR MODERATE (A) 07/30/2017 0836   BILIRUBINUR NEGATIVE 07/30/2017 0836   KETONESUR NEGATIVE 07/30/2017 0836   PROTEINUR NEGATIVE 07/30/2017 0836   NITRITE NEGATIVE 07/30/2017 0836   LEUKOCYTESUR NEGATIVE 07/30/2017 0836   Sepsis Labs: @LABRCNTIP (procalcitonin:4,lacticidven:4)  )No results found for this or any previous visit (from the past 240 hour(s)).     Studies: No results found.  Scheduled Meds: . amLODipine  5 mg Oral Daily  . chlorhexidine  15 mL Mouth Rinse BID  . Chlorhexidine Gluconate Cloth  6 each Topical Daily  . ciprofloxacin  500 mg Oral BID  . feeding supplement (ENSURE ENLIVE)  237 mL Oral TID BM  . heparin  5,000 Units Subcutaneous Q8H  . hydrALAZINE  25 mg Oral Q8H  . mouth rinse  15 mL Mouth Rinse q12n4p  . metroNIDAZOLE  500 mg Oral Q12H  . sodium chloride flush  10-40 mL Intracatheter Q12H    Continuous Infusions:   LOS: 37 days     Darlin Droparole N Shameca Landen, MD [facility] Pager [phone]  If 7PM-7AM, please contact night-coverage [email or web address] Password [email or web address] 08/29/2017, 8:26 AM

## 2017-08-29 NOTE — Progress Notes (Addendum)
[facility] Awaiting discharge summary, DNR placed on chart to be signed.   2:28pm- CSW informed pt is not discharging today. MD states most likely tomorrow.  Facility aware and amenable   CSW following to support discharge to SNF when medically appropriate.  Doy HutchingIsabel H Kingsley Farace, LCSWA [facility] Clinical Social Work [phone]

## 2017-08-30 LAB — BASIC METABOLIC PANEL
Anion gap: 8 (ref 5–15)
BUN: 42 mg/dL — ABNORMAL HIGH (ref 6–20)
CHLORIDE: 108 mmol/L (ref 101–111)
CO2: 20 mmol/L — ABNORMAL LOW (ref 22–32)
CREATININE: 2.04 mg/dL — AB (ref 0.61–1.24)
Calcium: 8.6 mg/dL — ABNORMAL LOW (ref 8.9–10.3)
GFR calc Af Amer: 38 mL/min — ABNORMAL LOW (ref >60)
GFR calc non Af Amer: 33 mL/min — ABNORMAL LOW (ref >60)
GLUCOSE: 122 mg/dL — AB (ref 65–99)
POTASSIUM: 3.9 mmol/L (ref 3.5–5.1)
Sodium: 136 mmol/L (ref 135–145)

## 2017-08-30 LAB — CBC
HEMATOCRIT: 29.7 % — AB (ref 39.0–52.0)
Hemoglobin: 9.2 g/dL — ABNORMAL LOW (ref 13.0–17.0)
MCH: 28.6 pg (ref 26.0–34.0)
MCHC: 31 g/dL (ref 30.0–36.0)
MCV: 92.2 fL (ref 78.0–100.0)
Platelets: 552 10*3/uL — ABNORMAL HIGH (ref 150–400)
RBC: 3.22 MIL/uL — ABNORMAL LOW (ref 4.22–5.81)
RDW: 16.2 % — ABNORMAL HIGH (ref 11.5–15.5)
WBC: 12 10*3/uL — ABNORMAL HIGH (ref 4.0–10.5)

## 2017-08-30 MED ORDER — LOPERAMIDE HCL 2 MG PO CAPS
2.0000 mg | ORAL_CAPSULE | Freq: Three times a day (TID) | ORAL | Status: DC
  Administered 2017-08-30 – 2017-08-31 (×4): 2 mg via ORAL
  Filled 2017-08-30 (×4): qty 1

## 2017-08-30 NOTE — Consult Note (Addendum)
WOC Nurse ostomy follow up Stoma type/location: RLQ, ileostomy Pouch was changed yesterday, according to the date marked on the appliance.  Current pouch is intact with a good seal, but is completely full with large amt yellow liquid stool.  Re-attached to bedside drainage bag and educated patient on the importance of avoiding overfilling.  He was able to attach and disconnect drainage bag and and states he will call the nurse for assistance next time to avoid overfilling and pouch leakage.   Output: Very HIGH VOLUMES recorded Ostomy pouching: 2pc 2 3/4"; high output pouch with spout  Demonstrated opening and closing spout; patient was able to perform this task for the WOC nurse  Pt is planning to go to SNF at [place], 3 extra wafers and high output pouches are available at the bedside for staff nurse use. SNF should consider hooking pouch to nightime drainage to prevent leakage and overfilling of pouch. Please re-consult if further assistance is needed.  Thank-you,  Cammie Mcgeeawn Matei Magnone MSN, RN, CWOCN, [place]-AP, CNS [phone]

## 2017-08-30 NOTE — Progress Notes (Signed)
Occupational Therapy Treatment Patient Details Name: Caleb Singh MRN: [id number] DOB: 1956-07-26 Today's Date: 08/30/2017    History of present illness Pt is a 62 yo male admitted 07/23/17 with intestinal perforation. Now s/p resection & ileostomy on 12/5 and 12/6; s/p abscess draining 12/12. VDRF. A fib with RVR. ETT 12/20 >> 12/24. 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 PMH includes ETOH abuse, PE.    OT comments  Pt making progress with functional goals. OT will continue to follow acutely  Follow Up Recommendations  SNF    Equipment Recommendations   TBD at next venue of care   Recommendations for Other Services      Precautions / Restrictions Precautions Precautions: Fall Precaution Comments: Multiple lines; multiple JP drains, temporary pacer Restrictions Weight Bearing Restrictions: No       Mobility Bed Mobility               General bed mobility comments: pt up in recliner upon arrival  Transfers Overall transfer level: Needs assistance Equipment used: Rolling walker (2 wheeled) Transfers: Sit to/from Stand Sit to Stand: Min guard              Balance Overall balance assessment: Needs assistance Sitting-balance support: Feet supported;No upper extremity supported       Standing balance support: Bilateral upper extremity supported;During functional activity                               ADL either performed or assessed with clinical judgement   ADL Overall ADL's : Needs assistance/impaired     Grooming: Wash/dry hands;Wash/dry face;Min guard;Standing           Upper Body Dressing : Minimal assistance;Sitting       Toilet Transfer: Min guard;Ambulation;RW   Toileting- Clothing Manipulation and Hygiene: Minimal assistance;Sit to/from stand               Vision Patient Visual Report: No change from baseline     Perception     Praxis      Cognition  Arousal/Alertness: Awake/alert Behavior During Therapy: WFL for tasks assessed/performed Overall Cognitive Status: Within Functional Limits for tasks assessed                                          Exercises Other Exercises Other Exercises: B UE AROM in all planes x 10 reps   Shoulder Instructions       General Comments      Pertinent Vitals/ Pain       Pain Assessment: 0-10 Pain Score: 4  Pain Location: abdomen with movement Pain Descriptors / Indicators: Sore Pain Intervention(s): Monitored during session;Repositioned  Home Living                                          Prior Functioning/Environment              Frequency  Min 2X/week        Progress Toward Goals  OT Goals(current goals can now be found in the care plan section)  Progress towards OT goals: Progressing toward goals     Plan Discharge plan remains appropriate    Co-evaluation  AM-PAC PT "6 Clicks" Daily Activity     Outcome Measure   Help from another person eating meals?: None Help from another person taking care of personal grooming?: A Little Help from another person toileting, which includes using toliet, bedpan, or urinal?: A Little Help from another person bathing (including washing, rinsing, drying)?: A Lot Help from another person to put on and taking off regular upper body clothing?: A Little Help from another person to put on and taking off regular lower body clothing?: A Lot 6 Click Score: 17    End of Session Equipment Utilized During Treatment: Gait belt;Rolling walker;Other (comment)(3 in 1)  OT Visit Diagnosis: Muscle weakness (generalized) (M62.81);Unsteadiness on feet (R26.81)   Activity Tolerance Patient tolerated treatment well   Patient Left with call bell/phone within reach;with family/visitor present;in chair   Nurse Communication      Functional Assessment Tool Used: AM-PAC 6 Clicks Daily Activity    Time: 1113-1140 OT Time Calculation (min): 27 min  Charges: OT G-codes **NOT FOR INPATIENT CLASS** Functional Assessment Tool Used: AM-PAC 6 Clicks Daily Activity OT General Charges $OT Visit: 1 Visit OT Treatments $Self Care/Home Management : 8-22 mins $Therapeutic Activity: 8-22 mins     Galen ManilaSpencer, Carlicia Leavens Jeanette 08/30/2017, 1:28 PM

## 2017-08-30 NOTE — Progress Notes (Signed)
Pt ambulated a total of about 75 meters on unit on room air with no deficit in oxygen saturations. o2 sats were 100% at rest and maintained that reading the whole time he was ambulating

## 2017-08-30 NOTE — Social Work (Signed)
MD paged CSW, informed CSW that pt is not discharging today as he is "too weak."  MD aware pt SNF placement will have to be re-reviewed when pt is ready for discharge.   11:11am- [name] aware that pt will not discharge today. They will review offer when pt is clinically stable if they have a bed available.   CSW continuing to follow to support discharge when medically appropriate.   Doy HutchingIsabel H Jake Fuhrmann, LCSWA [facility] Clinical Social Work [phone]

## 2017-08-30 NOTE — Progress Notes (Signed)
[facility] Progress Note  17 Days Post-Op  Subjective: CC-  No new complaints. Denies abdominal pain. Tolerating soft foods. Denies n/v. Had about 1670cc out of ileostomy over last 24 hours. Hoping to d/c to SNF today.  Objective: Vital signs in last 24 hours: Temp:  [98 F (36.7 C)-98.8 F (37.1 C)] 98 F (36.7 C) (01/11 0545) Pulse Rate:  [65-99] 65 (01/11 0545) Resp:  [18-20] 20 (01/11 0545) BP: (137-154)/(87-98) 137/92 (01/11 0545) SpO2:  [98 %-100 %] 98 % (01/11 0545) Weight:  [149 lb 14.6 oz (68 kg)] 149 lb 14.6 oz (68 kg) (01/11 0500) Last BM Date: 08/29/17  Intake/Output from previous day: 01/10 0701 - 01/11 0700 In: 1921.3 [P.O.:600; I.V.:1321.3] Out: 2580 [Urine:910; Stool:1670] Intake/Output this shift: Total I/O In: 240 [P.O.:240] Out: -   PE: Gen: Alert, NAD, pleasant HEENT: EOM's intact, pupils equal and round Card: RRR, no M/G/R heard Pulm:  effort normal Abd: Soft,ND, NT, +BS,open midline incision with sutures visible/some fibrinous exudate at the base but good granulation tissue at the edges, ileostomy pink and functioning with liquid stool in bag Skin: no rashes noted, warm and dry  Lab Results:  Recent Labs    08/29/17 0437  WBC 13.4*  HGB 9.2*  HCT 29.7*  PLT 509*   BMET Recent Labs    08/29/17 2116 08/30/17 0520  NA 130* 136  K 3.9 3.9  CL 103 108  CO2 19* 20*  GLUCOSE 101* 122*  BUN 42* 42*  CREATININE 1.97* 2.04*  CALCIUM 8.2* 8.6*   PT/INR No results for input(s): LABPROT, INR in the last 72 hours. CMP     Component Value Date/Time   NA 136 08/30/2017 0520   K 3.9 08/30/2017 0520   CL 108 08/30/2017 0520   CO2 20 (L) 08/30/2017 0520   GLUCOSE 122 (H) 08/30/2017 0520   BUN 42 (H) 08/30/2017 0520   CREATININE 2.04 (H) 08/30/2017 0520   CALCIUM 8.6 (L) 08/30/2017 0520   PROT 7.1 08/22/2017 0400   ALBUMIN 1.7 (L) 08/22/2017 0400   AST 112 (H) 08/22/2017 0400   ALT 231 (H) 08/22/2017 0400   ALKPHOS  207 (H) 08/22/2017 0400   BILITOT 1.3 (H) 08/22/2017 0400   GFRNONAA 33 (L) 08/30/2017 0520   GFRAA 38 (L) 08/30/2017 0520   Lipase     Component Value Date/Time   LIPASE 47 07/23/2017 1455       Studies/Results: Dg Chest Port 1 View  Result Date: 08/29/2017 CLINICAL DATA:  Dyspnea EXAM: PORTABLE CHEST 1 VIEW COMPARISON:  08/17/2017 FINDINGS: Right PICC line tip: SVC. Stable appearance of scarring or atelectasis along the right lateral costophrenic angle. Improved aeration in the left retrocardiac region. The lungs remain otherwise clear. The prior transvenous pacer lead is absent. Heart size within normal limits. IMPRESSION: 1. Scarring at the right lung base. Slightly improved aeration in the left lower lobe with some minimal linear residual opacities. Electronically Signed   By: Gaylyn Rong M.D.   On: 08/29/2017 11:32    Anti-infectives: Anti-infectives (From admission, onward)   Start     Dose/Rate Route Frequency Ordered Stop   08/25/17 1200  ciprofloxacin (CIPRO) tablet 500 mg     500 mg Oral 2 times daily 08/25/17 1154     08/25/17 1200  metroNIDAZOLE (FLAGYL) tablet 500 mg     500 mg Oral Every 12 hours 08/25/17 1154     08/10/17 0500  vancomycin (VANCOCIN) 1,500 mg in sodium chloride 0.9 %  500 mL IVPB  Status:  Discontinued     1,500 mg 250 mL/hr over 120 Minutes Intravenous Every 48 hours 08/08/17 0728 08/12/17 1018   08/08/17 2200  anidulafungin (ERAXIS) 100 mg in sodium chloride 0.9 % 100 mL IVPB  Status:  Discontinued     100 mg 78 mL/hr over 100 Minutes Intravenous Every 24 hours 08/08/17 0524 08/12/17 1018   08/08/17 0445  vancomycin (VANCOCIN) 1,500 mg in sodium chloride 0.9 % 500 mL IVPB     1,500 mg 250 mL/hr over 120 Minutes Intravenous  Once 08/08/17 0431 08/08/17 0659   08/08/17 0430  vancomycin (VANCOCIN) 1,250 mg in sodium chloride 0.9 % 250 mL IVPB  Status:  Discontinued     1,250 mg 166.7 mL/hr over 90 Minutes Intravenous  Once 08/08/17 0418  08/08/17 0428   08/08/17 0415  anidulafungin (ERAXIS) 200 mg in sodium chloride 0.9 % 200 mL IVPB     200 mg 78 mL/hr over 200 Minutes Intravenous  Once 08/08/17 0413 08/08/17 0816   08/05/17 1000  piperacillin-tazobactam (ZOSYN) IVPB 3.375 g  Status:  Discontinued     3.375 g 12.5 mL/hr over 240 Minutes Intravenous Every 8 hours 08/05/17 0946 08/24/17 0945   08/02/17 1400  meropenem (MERREM) 1 g in sodium chloride 0.9 % 100 mL IVPB  Status:  Discontinued     1 g 200 mL/hr over 30 Minutes Intravenous Every 12 hours 08/02/17 1111 08/05/17 0945   07/29/17 0600  piperacillin-tazobactam (ZOSYN) IVPB 3.375 g  Status:  Discontinued     3.375 g 12.5 mL/hr over 240 Minutes Intravenous Every 8 hours 07/29/17 0518 08/02/17 1036   07/25/17 2000  vancomycin (VANCOCIN) IVPB 1000 mg/200 mL premix  Status:  Discontinued     1,000 mg 200 mL/hr over 60 Minutes Intravenous Every 48 hours 07/23/17 2042 07/24/17 0956   07/24/17 2200  piperacillin-tazobactam (ZOSYN) 3.375 g in dextrose 5 % 50 mL IVPB  Status:  Discontinued     3.375 g 100 mL/hr over 30 Minutes Intravenous Every 6 hours 07/24/17 2006 07/29/17 0518   07/24/17 2100  anidulafungin (ERAXIS) 100 mg in sodium chloride 0.9 % 100 mL IVPB  Status:  Discontinued     100 mg 78 mL/hr over 100 Minutes Intravenous Every 24 hours 07/23/17 2039 08/05/17 0945   07/24/17 0000  anidulafungin (ERAXIS) 100 mg in sodium chloride 0.9 % 100 mL IVPB  Status:  Discontinued     100 mg 78 mL/hr over 100 Minutes Intravenous Every 24 hours 07/23/17 2038 07/23/17 2039   07/23/17 2200  piperacillin-tazobactam (ZOSYN) IVPB 3.375 g  Status:  Discontinued     3.375 g 12.5 mL/hr over 240 Minutes Intravenous Every 8 hours 07/23/17 2043 07/24/17 2006   07/23/17 2100  anidulafungin (ERAXIS) 200 mg in sodium chloride 0.9 % 200 mL IVPB     200 mg 78 mL/hr over 200 Minutes Intravenous  Once 07/23/17 2038 07/24/17 0239   07/23/17 2100  vancomycin (VANCOCIN) 1,500 mg in sodium  chloride 0.9 % 500 mL IVPB     1,500 mg 250 mL/hr over 120 Minutes Intravenous  Once 07/23/17 2041 07/23/17 2317   07/23/17 1515  piperacillin-tazobactam (ZOSYN) IVPB 3.375 g     3.375 g 100 mL/hr over 30 Minutes Intravenous  Once 07/23/17 1511 07/23/17 1611       Assessment/Plan Perforated distal small boweland ischemia of cecum 1.S/Pexploratory laparotomy, small bowel resection without anastomosis, placement of open wound VAC system 07/23/17 Dr. Gaynelle Adu 2.  S/p Reexploration of abdomen, ileocecectomy, creation of end ileostomy,closure of abdominal wall, 07/25/17, Dr. Almond LintFaera Byerly 3. Exploratory laparotomy, drainage of abdominal abscess and evacuation of pelvic hematoma, RUQ drain, and left lateral drain placement, 07/31/17, Dr. Glenna FellowsBenjamin Hoxworth (Findings:Subdiaphragmatic and subhepatic abscesses.Large organizing pelvic hematoma. Apparent necrotic tissue left lobe of the liver.) Bacteremia - Enterobacteriaceae + E Coli Cardiac arrest - Code Blue 08/08/17  Acute renal failure-CRRT discontinued 07/27/17. Cr improved from 24 hours ago Malnutrition/Deconditioning -taking insoft diet(due toissues with dentition),andEnsure GI-needs continued dressing changes, wound isdehisced but seems to be scarred into place. High risk for hernia, lower risk for evisceration IRdrained collection posterior to anastomosis- drains removed DG:UYQIH:cipro and flagyl 1/6>> (planning 5-7 days oral antibiotics post removal of last drain) KVQ:QVZDVT:SCDs - Heparin Follow-up:Dr. Gaynelle AduEric Wilson  Plan: Ready for d/c to SNF from surgical standpoint. Monitor ileostomy output closely as this can dehydrate patient if it gets too high. Encouraged patient to drink more fluids throughout the day. Continue BID wet to dry dressing changes. Cipro/flagyl x5-7 days post removal last drain 1/6.    LOS: 38 days    Franne FortsBrooke A Destenie Ingber , [facility] 08/30/2017, 9:06 AM Pager: [phone] Consults:  [phone] Mon-Fri 7:00 am-4:30 pm Sat-Sun 7:00 am-11:30 am

## 2017-08-30 NOTE — Progress Notes (Signed)
CCMD notified of pt noted with 11 beats of wide QRS. Pt noted asleep when went to room. Pt denies chest pain when awakened.

## 2017-08-30 NOTE — Progress Notes (Signed)
PROGRESS NOTE  Caleb Singh ZOX:[id number] DOB: 1956/04/01 DOA: 07/23/2017 PCP: Patient, No Pcp Per  HPI/Recap of past 24 hours: Mr Caleb Singh 62 yo M with PMH significant for tobacco use disorder who presented on 07/23/17 with perforated distal small bowel and ischemia of cecum. POD #15 post  Exploratory laparotomy SB resection w/o anastomosis, placement of open wound vac system, ileostomy, closure of abdominal wall, drainage of abd abscess and evacuation of pelvic hematoma, RUQ drain.  Hospital course complicated by cardiac arrest, bacteremia, and acute renal failure, malnutrition and deconditioning.  Seen and examined at his bedside. Hypovolemic with AKI. IV fluid hydration NS. Reports generalized weakness. State prior to illness was very fit and doing a lot of walking. Now reports unable to ambulate from bed to chair without feeling short of breath. Home O2 evaluation ordered to assess for need of supplemental O2 upon ambulation.  Assessment/Plan: Principal Problem:   Intestinal perforation s/p SB resection/ileostomy 07/24/2017 & 07/25/2017 Active Problems:   Free intraperitoneal air   AKI (acute kidney injury) (HCC)   Acute respiratory failure (HCC)   Ileostomy in place [facility](HCC)   Acute delirium   Tachypnea   Pressure injury of skin   Septic shock (HCC)   Bradycardia   Postoperative intra-abdominal abscess   Acute encephalopathy   DNR (do not resuscitate) discussion   Palliative care by specialist   Weakness generalized   Abdominopelvic abscess (HCC)   Protein-calorie malnutrition, severe  POD# 17 post exploratory laparotomy/ SB resection without anastomosis/RUQ drain -Surgery following -High output from RUQ drain -Follow up with Dr. Gaynelle AduEric Wilson -c/w wet too dry dressing changes -cipro/flagyl 5-7 days post removal last drain 08/25/17- 09/01/17 -Monitor ileostomy output;  -output improving from1800 cc to 1600 cc  AKI on CKD 3, most likely prerenal from dehydration -Baseline cr  1.7 -cr 2.03 from 2.23 from 2.09 -avoid nephrotoxic meds/hypotension -IV fluid bolus NS at 500cc/hr 08/29/17 -IV fluid 75cc/hr NS -BMP am  Malnutrition/Deconditioning -Nutritionist following -ensure TID -encouraged oral intake  Anemia with acute blood loss -Hg 9.2  -No sign of overt bleeding -CBC am  Dehydration -management as stated above -encouraged to increase po intake  Debilitating/generalized weakness -PT evaluate and treat -OOB to chair with assistance -Home O2 evaluation   Code Status: DNR  Family Communication: no family members at bedside  Disposition Plan: will stay another midnight to continue antibiotics and to improve symptomology   Consultants:  Surgery  nutritionist  Procedures:  Exploratory laparotory  Antimicrobials:  cipro/flagyl  DVT prophylaxis:  SCDs- heparin sq 5000 u TID   Objective: Vitals:   08/29/17 2233 08/30/17 0500 08/30/17 0545 08/30/17 1452  BP: (!) 154/87  (!) 137/92 (!) 132/95  Pulse: 82  65 (!) 102  Resp: 19  20 20   Temp: 98.8 F (37.1 C)  98 F (36.7 C) 98.3 F (36.8 C)  TempSrc: Oral  Oral Oral  SpO2: 100%  98% 98%  Weight:  68 kg (149 lb 14.6 oz)    Height:        Intake/Output Summary (Last 24 hours) at 08/30/2017 1628 Last data filed at 08/30/2017 1453 Gross per 24 hour  Intake 2060 ml  Output 1780 ml  Net 280 ml   Filed Weights   08/27/17 0458 08/29/17 0500 08/30/17 0500  Weight: 66.3 kg (146 lb 2.6 oz) 64.5 kg (142 lb 3.2 oz) 68 kg (149 lb 14.6 oz)    Exam:   General:  62 yo AAM cachectic NAD A&O x3. Mucous membranes  dry.  Cardiovascular: RRR no rubs or gallops   Respiratory: CTA no wheezes or rales   Abdomen: surgical dressings and ileostomy drain with high output  Musculoskeletal: non focal no edema  Skin: as stated above. Surgical dressings  Psychiatry: Mood appropriate for condition and setting   Data Reviewed: CBC: Recent Labs  Lab 08/24/17 0437 08/25/17 0340  08/26/17 0339 08/29/17 0437 08/30/17 0908  WBC 10.1 10.3 10.5 13.4* 12.0*  HGB 7.3* 7.3* 7.8* 9.2* 9.2*  HCT 23.8* 24.0* 25.9* 29.7* 29.7*  MCV 92.2 92.0 92.5 92.2 92.2  PLT 440* 478* 505* 509* 552*   Basic Metabolic Panel: Recent Labs  Lab 08/26/17 0339 08/28/17 0428 08/29/17 0437 08/29/17 2116 08/30/17 0520  NA 141 138 138 130* 136  K 4.0 4.0 4.3 3.9 3.9  CL 112* 109 109 103 108  CO2 22 19* 20* 19* 20*  GLUCOSE 101* 116* 103* 101* 122*  BUN 29* 40* 45* 42* 42*  CREATININE 1.91* 2.09* 2.23* 1.97* 2.04*  CALCIUM 8.4* 9.0 8.9 8.2* 8.6*   GFR: Estimated Creatinine Clearance: 35.6 mL/min (A) (by C-G formula based on SCr of 2.04 mg/dL (H)). Liver Function Tests: No results for input(s): AST, ALT, ALKPHOS, BILITOT, PROT, ALBUMIN in the last 168 hours. No results for input(s): LIPASE, AMYLASE in the last 168 hours. No results for input(s): AMMONIA in the last 168 hours. Coagulation Profile: No results for input(s): INR, PROTIME in the last 168 hours. Cardiac Enzymes: No results for input(s): CKTOTAL, CKMB, CKMBINDEX, TROPONINI in the last 168 hours. BNP (last 3 results) No results for input(s): PROBNP in the last 8760 hours. HbA1C: No results for input(s): HGBA1C in the last 72 hours. CBG: Recent Labs  Lab 08/24/17 0816 08/24/17 1148 08/24/17 1616 08/24/17 2355 08/25/17 0812  GLUCAP 97 144* 119* 91 88   Lipid Profile: No results for input(s): CHOL, HDL, LDLCALC, TRIG, CHOLHDL, LDLDIRECT in the last 72 hours. Thyroid Function Tests: No results for input(s): TSH, T4TOTAL, FREET4, T3FREE, THYROIDAB in the last 72 hours. Anemia Panel: No results for input(s): VITAMINB12, FOLATE, FERRITIN, TIBC, IRON, RETICCTPCT in the last 72 hours. Urine analysis:    Component Value Date/Time   COLORURINE YELLOW 07/30/2017 0836   APPEARANCEUR CLOUDY (A) 07/30/2017 0836   LABSPEC 1.012 07/30/2017 0836   PHURINE 5.0 07/30/2017 0836   GLUCOSEU NEGATIVE 07/30/2017 0836   HGBUR  MODERATE (A) 07/30/2017 0836   BILIRUBINUR NEGATIVE 07/30/2017 0836   KETONESUR NEGATIVE 07/30/2017 0836   PROTEINUR NEGATIVE 07/30/2017 0836   NITRITE NEGATIVE 07/30/2017 0836   LEUKOCYTESUR NEGATIVE 07/30/2017 0836   Sepsis Labs: @LABRCNTIP (procalcitonin:4,lacticidven:4)  )No results found for this or any previous visit (from the past 240 hour(s)).    Studies: No results found.  Scheduled Meds: . amLODipine  5 mg Oral Daily  . chlorhexidine  15 mL Mouth Rinse BID  . Chlorhexidine Gluconate Cloth  6 each Topical Daily  . ciprofloxacin  500 mg Oral BID  . feeding supplement (ENSURE ENLIVE)  237 mL Oral TID BM  . heparin  5,000 Units Subcutaneous Q8H  . hydrALAZINE  25 mg Oral Q8H  . loperamide  2 mg Oral Q8H  . mouth rinse  15 mL Mouth Rinse q12n4p  . metroNIDAZOLE  500 mg Oral Q12H  . sodium chloride flush  10-40 mL Intracatheter Q12H    Continuous Infusions: . sodium chloride 75 mL/hr at 08/30/17 0540     LOS: 38 days     Darlin Drop, MD [facility] Pager  [phone]  If 7PM-7AM, please contact night-coverage [email or web address] Password [facility] 08/30/2017, 4:28 PM

## 2017-08-31 MED ORDER — ENSURE ENLIVE PO LIQD: 237.0000 mL | Freq: Three times a day (TID) | ORAL | 0 refills | Status: DC

## 2017-08-31 MED ORDER — CIPROFLOXACIN HCL 500 MG PO TABS: 500.0000 mg | ORAL_TABLET | Freq: Two times a day (BID) | ORAL | 0 refills | Status: AC

## 2017-08-31 MED ORDER — PSYLLIUM 95 % PO PACK
1.0000 | PACK | Freq: Every day | ORAL | Status: DC
  Administered 2017-08-31: 1 via ORAL
  Filled 2017-08-31: qty 1

## 2017-08-31 MED ORDER — HYDRALAZINE HCL 25 MG PO TABS: 25.0000 mg | ORAL_TABLET | Freq: Three times a day (TID) | ORAL | 0 refills | Status: DC

## 2017-08-31 MED ORDER — PSYLLIUM 95 % PO PACK: 1.0000 | PACK | Freq: Every day | ORAL | 0 refills | Status: DC

## 2017-08-31 MED ORDER — METRONIDAZOLE 500 MG PO TABS: 500.0000 mg | ORAL_TABLET | Freq: Two times a day (BID) | ORAL | 0 refills | Status: AC

## 2017-08-31 MED ORDER — LOPERAMIDE HCL 2 MG PO CAPS: 2.0000 mg | ORAL_CAPSULE | Freq: Three times a day (TID) | ORAL | 0 refills | Status: DC

## 2017-08-31 MED ORDER — AMLODIPINE BESYLATE 5 MG PO TABS: 5.0000 mg | ORAL_TABLET | Freq: Every day | ORAL | 0 refills | Status: DC

## 2017-08-31 NOTE — Clinical Social Work Note (Signed)
CSW facilitated patient discharge including contacting patient family and facility to confirm patient discharge plans. Clinical information faxed to facility and family agreeable with plan. CS[facility]. RN to call report prior to discharge [phone]).  CSW will sign off for now as social work intervention is no longer needed. Please consult us again if new needs arise.  Charlynn CourtSarah Mariyana Fulop, CSW [phone]

## 2017-08-31 NOTE — Progress Notes (Signed)
Physical Therapy Treatment Patient Details Name: Caleb Singh MRN: [id number] DOB: 16-Aug-1956 Today's Date: 08/31/2017    History of Present Illness Pt is a 62 yo male admitted 07/23/17 with intestinal perforation. Now s/p resection & ileostomy on 12/5 and 12/6; s/p abscess draining 12/12. VDRF. A fib with RVR. ETT 12/20 >> 12/24. 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 PMH includes ETOH abuse, PE.     PT Comments    Patient is making progress toward mobility goals. Pt required min guard/min A for OOB mobility. Pt continues to demonstrate decreased activity tolerance and generalized weakness with SOB while ambulating. SpO2 95% on RA and HR up to 128 with mobility. Continue to progress as tolerated with anticipated d/c to SNF for further skilled PT services.    Follow Up Recommendations  SNF     Equipment Recommendations  Other (comment)(TBA )    Recommendations for Other Services OT consult     Precautions / Restrictions Precautions Precautions: Fall    Mobility  Bed Mobility Overal bed mobility: Needs Assistance Bed Mobility: Supine to Sit     Supine to sit: Supervision     General bed mobility comments: supervision for safety; HOB elevated and use of rails  Transfers Overall transfer level: Needs assistance Equipment used: Rolling walker (2 wheeled) Transfers: Sit to/from Stand Sit to Stand: Min guard;Min assist         General transfer comment: min guard to come into standing and min A upon initial stand due to unsteadiness; cues for safe hand placement   Ambulation/Gait Ambulation/Gait assistance: Min guard Ambulation Distance (Feet): 100 Feet Assistive device: Rolling walker (2 wheeled) Gait Pattern/deviations: Step-through pattern;Trunk flexed;Decreased stride length;Narrow base of support Gait velocity: decr   General Gait Details: pt with SOB; HR up to 128 and SpO2 95% on RA; cues for posture and safe  use of AD; pt fatigued    Stairs            Wheelchair Mobility    Modified Rankin (Stroke Patients Only)       Balance Overall balance assessment: Needs assistance Sitting-balance support: Feet supported;No upper extremity supported Sitting balance-Leahy Scale: Fair     Standing balance support: Bilateral upper extremity supported;During functional activity Standing balance-Leahy Scale: Poor                              Cognition Arousal/Alertness: Awake/alert Behavior During Therapy: WFL for tasks assessed/performed Overall Cognitive Status: Within Functional Limits for tasks assessed                                        Exercises      General Comments        Pertinent Vitals/Pain Pain Assessment: No/denies pain    Home Living                      Prior Function            PT Goals (current goals can now be found in the care plan section) Acute Rehab PT Goals Patient Stated Goal: to regain PLOF PT Goal Formulation: With patient Time For Goal Achievement: 09/09/17 Potential to Achieve Goals: Good Progress towards PT goals: Progressing toward goals    Frequency    Min 2X/week  PT Plan Current plan remains appropriate    Co-evaluation              AM-PAC PT "6 Clicks" Daily Activity  Outcome Measure  Difficulty turning over in bed (including adjusting bedclothes, sheets and blankets)?: Unable Difficulty moving from lying on back to sitting on the side of the bed? : Unable Difficulty sitting down on and standing up from a chair with arms (e.g., wheelchair, bedside commode, etc,.)?: Unable Help needed moving to and from a bed to chair (including a wheelchair)?: A Little Help needed walking in hospital room?: A Little Help needed climbing 3-5 steps with a railing? : A Lot 6 Click Score: 11    End of Session Equipment Utilized During Treatment: Gait belt Activity Tolerance: Patient tolerated  treatment well;Patient limited by fatigue Patient left: in chair;with call bell/phone within reach Nurse Communication: Mobility status PT Visit Diagnosis: Muscle weakness (generalized) (M62.81);Other abnormalities of gait and mobility (R26.89)     Time: [id number] PT Time Calculation (min) (ACUTE ONLY): 27 min  Charges:  $Gait Training: 8-22 mins $Therapeutic Activity: 8-22 mins                    G Codes:       Erline LevineKellyn Donnica Jarnagin, PTA Pager: [phone]     Carolynne EdouardKellyn R [place] 08/31/2017, 11:20 AM

## 2017-08-31 NOTE — Progress Notes (Signed)
Attempts made to give a handoff report to staff at [facility] but nor successful.  Call back number left to a staff and made them aware that pt will be leaving the hospital by 1430.

## 2017-08-31 NOTE — Discharge Summary (Signed)
Discharge Summary  Noa Constante ZOX:[id number] DOB: [birth year]  PCP: Patient, No Pcp Per  Admit date: 07/23/2017 Discharge date: 08/31/2017  Time spent: 25 minutes  Recommendations for Outpatient Follow-up:  1. Follow up with General surgery Dr. Gaynelle Adu 2. Stay hydrated and avoid dehydration 3. Take your medications as prescribed   Discharge Diagnoses:  Active Hospital Problems   Diagnosis Date Noted  . Intestinal perforation s/p SB resection/ileostomy 07/24/2017 & 07/25/2017 07/27/2017  . Protein-calorie malnutrition, severe 08/28/2017  . Abdominopelvic abscess (HCC)   . Bradycardia   . Postoperative intra-abdominal abscess   . Acute encephalopathy   . DNR (do not resuscitate) discussion   . Palliative care by specialist   . Weakness generalized   . Septic shock (HCC)   . Pressure injury of skin 08/07/2017  . Tachypnea   . Acute delirium   . Ileostomy in place [facility]) 07/27/2017  . Acute respiratory failure (HCC)   . AKI (acute kidney injury) (HCC)   . Free intraperitoneal air 07/23/2017    Resolved Hospital Problems  No resolved problems to display.    Discharge Condition: Stable   Diet recommendation: Resume previous diet as tolerated   Vitals:   08/30/17 2207 08/31/17 0623  BP: 133/83 (!) 151/88  Pulse: (!) 105 95  Resp: 20 20  Temp: 99.1 F (37.3 C) 97.6 F (36.4 C)  SpO2: 100% 100%    History of present illness:  Mr Baumgardner 62 yo M with PMH significant for tobacco use disorder who presented on 07/23/17 with perforated distal small bowel and ischemia of cecum. POD #18 post  Exploratory laparotomy SB resection w/o anastomosis, placement of open wound vac system, ileostomy, closure of abdominal wall, drainage of abd abscess and evacuation of pelvic hematoma, RUQ drain.  Hospital course complicated by cardiac arrest, bacteremia, and acute renal failure, malnutrition, deconditioning, and dehydration.  High output from ileostomy. Patient advised to  increase po intake and to avoid dehydration. Patient understands and agrees to plan. Will need to follow up with surgery post discharge and complete course of antibiotics.  On the day of discharge the patient was hemodynamically stable. Was ambulating without distress. Was eating with no nausea or abdominal pain.  Hospital Course:  Principal Problem:   Intestinal perforation s/p SB resection/ileostomy 07/24/2017 & 07/25/2017 Active Problems:   Free intraperitoneal air   AKI (acute kidney injury) (HCC)   Acute respiratory failure (HCC)   Ileostomy in place [facility])   Acute delirium   Tachypnea   Pressure injury of skin   Septic shock (HCC)   Bradycardia   Postoperative intra-abdominal abscess   Acute encephalopathy   DNR (do not resuscitate) discussion   Palliative care by specialist   Weakness generalized   Abdominopelvic abscess (HCC)   Protein-calorie malnutrition, severe  POD# 17 post exploratory laparotomy/ SB resection without anastomosis/RUQ drain -Surgery following -High output from RUQ drain -Follow up with Dr. Gaynelle Adu -c/w wet too dry dressing changes -cipro/flagyl 5-7 days post removal last drain 08/25/17- 09/01/17 -Monitor ileostomy output;  -output improving from1800 cc to 1600 cc  AKI on CKD 3, most likely prerenal from dehydration -Baseline cr 1.7 -cr 2.03 from 2.23 from 2.09 -avoid nephrotoxic meds/hypotension -IV fluid bolus NS at 500cc/hr 08/29/17 -IV fluid 75cc/hr NS until discharge -advised to increase po intake  HTN -BP stable -Amlodipine, hydralazine  Malnutrition/Deconditioning -Nutritionist following -ensure TID -encouraged oral intake  Anemia with acute blood loss -Hg 9.2  -No sign of overt bleeding  Dehydration -management as  stated above -encouraged to increase po intake  Debilitating/generalized weakness -PT evaluate and treat -OOB to chair with assistance -Home O2 evaluation- passed -continue PT at  SNF  Consultants:  Surgery  nutritionist  Procedures:  Exploratory laparotory  Antimicrobials:  cipro/flagyl   Discharge Exam: BP (!) 151/88 (BP Location: Left Arm)   Pulse 95   Temp 97.6 F (36.4 C) (Oral)   Resp 20   Ht 5\' 10"  (1.778 m)   Wt 68 kg (149 lb 14.6 oz)   SpO2 100%   BMI 21.51 kg/m    General:  62 yo pleasant AAM thin NAD A&O x3. Mucous membranes dry.  Cardiovascular: RRR no rubs or gallops   Respiratory: CTA no wheezes or rales   Abdomen: surgical dressings and ileostomy drain with high output  Musculoskeletal: non focal no edema  Skin: as stated above. Surgical dressings  Psychiatry: Mood appropriate for condition and setting   Discharge Instructions You were cared for by a hospitalist during your hospital stay. If you have any questions about your discharge medications or the care you received while you were in the hospital after you are discharged, you can call the unit and asked to speak with the hospitalist on call if the hospitalist that took care of you is not available. Once you are discharged, your primary care physician will handle any further medical issues. Please note that NO REFILLS for any discharge medications will be authorized once you are discharged, as it is imperative that you return to your primary care physician (or establish a relationship with a primary care physician if you do not have one) for your aftercare needs so that they can reassess your need for medications and monitor your lab values.   Allergies as of 08/31/2017   No Known Allergies     Medication List    STOP taking these medications   clindamycin 300 MG capsule Commonly known as:  CLEOCIN   oxyCODONE 5 MG immediate release tablet Commonly known as:  Oxy IR/ROXICODONE     TAKE these medications   amLODipine 5 MG tablet Commonly known as:  NORVASC Take 1 tablet (5 mg total) by mouth daily. Start taking on:  09/01/2017   ciprofloxacin 500 MG  tablet Commonly known as:  CIPRO Take 1 tablet (500 mg total) by mouth 2 (two) times daily for 3 days.   feeding supplement (ENSURE ENLIVE) Liqd Take 237 mLs by mouth 3 (three) times daily between meals.   hydrALAZINE 25 MG tablet Commonly known as:  APRESOLINE Take 1 tablet (25 mg total) by mouth every 8 (eight) hours.   loperamide 2 MG capsule Commonly known as:  IMODIUM Take 1 capsule (2 mg total) by mouth every 8 (eight) hours.   metroNIDAZOLE 500 MG tablet Commonly known as:  FLAGYL Take 1 tablet (500 mg total) by mouth every 12 (twelve) hours for 3 days.   psyllium 95 % Pack Commonly known as:  HYDROCIL/METAMUCIL Take 1 packet by mouth daily. Start taking on:  09/01/2017      No Known Allergies  Contact information for follow-up providers    Gaynelle Adu, MD. Call in 2 week(s).   Specialty:  General Surgery Why:  Call to make a follow up appointment with your surgeon. Contact information: [address] [place] [address] [phone]            Contact information for after-discharge care    Destination    [name]  SNF Follow up.   Service:  Skilled Nursing Contact information: [address] [place] [address] [phone]                   The results of significant diagnostics from this hospitalization (including imaging, microbiology, ancillary and laboratory) are listed below for reference.    Significant Diagnostic Studies: Ct Abdomen Pelvis Wo Contrast  Result Date: 08/22/2017 CLINICAL DATA:  62 year old male with a history of distal small bowel perforation status post ileo seek ectomy complicated by intra-abdominal abscess formation. Patient has a percutaneous drainage catheter in the left subdiaphragmatic space, a percutaneous drainage catheter in the right lower quadrant as well as 2 surgically placed drains in the right upper quadrant. Current output is approximately 20-30 mL per  day. EXAM: CT ABDOMEN AND PELVIS WITHOUT CONTRAST TECHNIQUE: Multidetector CT imaging of the abdomen and pelvis was performed following the standard protocol without IV contrast. COMPARISON:  Multiple prior studies, most recent CT scan of the abdomen and pelvis 12/227/2018 FINDINGS: Lower chest: Improving left-sided pleural effusion. Persistent bibasilar atelectasis. Stable cardiac and visualized mediastinal contours. Unremarkable distal thoracic esophagus. Hepatobiliary: Normal hepatic contour morphology. No discrete hepatic lesion. Gallbladder distension with mild gallbladder wall thickening. No intra or extrahepatic biliary ductal dilatation. Pancreas: Unremarkable. No pancreatic ductal dilatation or surrounding inflammatory changes. Spleen: Normal in size without focal abnormality. Adrenals/Urinary Tract: Normal appearance of the adrenal glands. Limited evaluation the kidneys in the absence of intravenous contrast. No evidence of hydronephrosis or nephrolithiasis. Stable complex/hemorrhagic subcentimeter cyst in the lower pole of left kidney. Stable 2.8 cm simple cyst in the interpolar right kidney. Ureters and bladder are unremarkable. Stomach/Bowel: Surgical changes of ileocecectomy with diverting RLQ ileostomy are again evident. Persistent diffuse mild small bowel wall thickening. Several loops of small bowel are mildly dilated. No overt obstruction. Vascular/Lymphatic: Limited evaluation in the absence of intravenous contrast. No aneurysm. Reproductive: Prostate is unremarkable. Other: The left upper quadrant perisplenic drainage catheter remains in good position. No measurable residual fluid in this region. Two surgically placed drains are present in the right upper quadrant anterior and postero inferior to the liver. No significant residual measurable fluid collection about the liver. The most recently placed drainage catheter in the right lower quadrant remains in good position. There is been significant  interval decrease in the volume of the high attenuation fluid collection in the right pericolic gutter. The residual fluid component measures approximately 4.7 x 1.8 cm compared to 7.2 x 4.0 cm previously. The high attenuation collection in in the anatomic pelvis is also smaller in size presently measuring 3.9 by 4.1 cm compared to 4.5 x 4.8 cm. A high attenuation collection in the region of the ileal mesenteries is also slightly smaller in size measuring approximately 4 4.7 x 3.5 cm compared to 5.4 x 3.9 cm previously. High attenuation fluid within the left pericolic gutter extending inferiorly into the bilateral inguinal recesses is also smaller compared to prior imaging. An index pocket of the fluid measured in the left pericolic gutter at the level of the mid kidneys measures 5.6 x 2.1 cm today compared to 7.0 x 2.6 cm previously. Musculoskeletal: No acute osseous abnormality. IMPRESSION: 1. Overall, significant interval improvement in multiple intra-abdominal fluid collections compared to 08/15/2017 as detailed below. 2. The left upper quadrant perisplenic drainage catheter remains in good position and there is no residual measurable fluid collection. 3. The 2 surgical drains in the perihepatic space remain in good position with no significant residual measurable  fluid. 4. The most recently placed right lower quadrant drainage catheter remains in good position with persistent but decreasing fluid in the right pericolic gutter. 5. The undrained intermediate attenuation fluid within the left pericolic gutter, ileal mesenteries, anatomic pelvis and bilateral inguinal recesses is all decreasing in volume. 6. The gallbladder distension with mild gallbladder wall thickening is nonspecific and may be secondary to prolonged NPO status versus acalculous cholecystitis in the appropriate clinical setting. 7. Improving left-sided pleural effusion and bibasilar atelectasis. 8. Persistent diffuse mesenteric stranding and  small bowel wall thickening consistent with evolving postsurgical changes and residual inflammation. Electronically Signed   By: Malachy Moan M.D.   On: 08/22/2017 13:49   Ct Abdomen Pelvis Wo Contrast  Result Date: 08/15/2017 CLINICAL DATA:  62 year old male with perforated small bowel and ischemic cecum post resection. Follow-up for evaluation of abdominal abscess/hematoma. Subsequent encounter. EXAM: CT ABDOMEN AND PELVIS WITHOUT CONTRAST TECHNIQUE: Multidetector CT imaging of the abdomen and pelvis was performed following the standard protocol without IV contrast. COMPARISON:  Several prior CTs, most recent 08/08/2017 and 08/07/2017. FINDINGS: Lower chest: Basilar atelectasis/infiltrates with small pleural effusions. Pacer leads right ventricle. Hepatobiliary: There are 2 drainage catheters surrounding the liver. Drainage catheter extending into collection along the anterior superior margin of the left lobe liver with decrease in size and mass effect of abscess now measuring 2 x 5.2 x 9 cm versus prior 3.2 x 6 x 9.8 cm. Catheter extending into the abscess along the inferior posterior aspect of the right lobe liver contains small amount a gas (possibly from flushing) and has overall similar dimensions to prior exam measuring 7.8 x 1.8 x 8 cm. Hyperdense material within the gallbladder. Pancreas: Taking into account limitation by non contrast imaging, no primary pancreatic abnormality. Spleen: Pigtail catheter placed for drainage of perisplenic/ subdiaphragmatic abscess. Significant decrease in size of perisplenic abscess now with maximal thickness of 1.1 cm versus prior 4.1 cm. Adrenals/Urinary Tract: No hydronephrosis. Right renal 2 cm cyst and possibly hyperdense left renal 1 cm cyst. No adrenal lesion. Gas in the urinary bladder may be related to recent manipulation. Stomach/Bowel: Post surgery with staple line at level of proximal ascending colon. Colostomy right lower quadrant. Complex fluid  collections within the abdomen and pelvis. The 2 largest fluid collections are in the lower right pericolic gutter (adjacent to suture line of proximal ascending colon) appearing slightly dense and minimally more prominent than on the prior examination now measuring 10 x 4.2 x 4.4 cm and previously measuring 9.8 x 4 x 3.9 cm. Largest collection in the pelvis adjacent to the sigmoid colon as decreased in size now measuring 4.5 x 4.8 cm and previously measured 4.8 x 5 cm. Vascular/Lymphatic: Atherosclerotic changes aorta and iliac/femoral artery is without abdominal aortic aneurysm. Reactive lymph nodes. Reproductive: No acute abnormality. Other: Large anterior abdominal/pelvic incision site healing by secondary intent. Small amount of fluid extends into the inguinal canal. Mild third spacing of fluid. Musculoskeletal: Prominent degenerative changes lumbar spine without change. No surrounding soft tissue abnormality to suggest spread of infection to the thoracic spine. IMPRESSION: Post surgery with staple line at level of proximal ascending colon. Colostomy right lower quadrant. Pigtail catheter placed for drainage of perisplenic/ subdiaphragmatic abscess. Significant decrease in size of perisplenic abscess now with maximal thickness of 1.1 cm versus prior 4.1 cm. Drainage catheter extending into collection along the anterior superior margin of the left lobe liver with decrease in size and decreased mass effect of abscess which now measures 2  x 5.2 x 9 cm versus prior 3.2 x 6 x 9.8 cm. Catheter extending into the abscess along the inferior posterior aspect of the right lobe liver contains small amount a gas (possibly from flushing) and has overall similar dimensions to prior exam measuring 7.8 x 1.8 x 8 cm. Complex fluid collections within the abdomen and pelvis. The 2 largest fluid collections are in the lower right pericolic gutter (adjacent to suture line of proximal ascending colon) appearing slightly dense and  minimally more prominent than on the prior examination now measuring 10 x 4.2 x 4.4 cm and previously measuring 9.8 x 4 x 3.9 cm. Largest collection in the pelvis adjacent to the sigmoid colon has decreased in size now measuring 4.5 x 4.8 cm and previously measured 4.8 x 5 cm. Bibasilar atelectasis/infiltrates with small pleural effusions. Pacemaker leads in the region of the right ventricle. Electronically Signed   By: Lacy Duverney M.D.   On: 08/15/2017 14:11   Ct Abdomen Pelvis Wo Contrast  Result Date: 08/07/2017 CLINICAL DATA:  Abdominal distention. History perforated and ischemic cecum. Exploratory laparotomy for abdominal abscess and pelvic hematoma. EXAM: CT CHEST, ABDOMEN AND PELVIS WITHOUT CONTRAST TECHNIQUE: Multidetector CT imaging of the chest, abdomen and pelvis was performed following the standard protocol without IV contrast. COMPARISON:  Abdomen and pelvis CT from 8 days ago FINDINGS: CT CHEST FINDINGS Cardiovascular: Normal heart size. No pericardial effusion. PICC on the right with tip at the SVC. Mediastinum/Nodes: Negative for adenopathy. Lungs/Pleura: Multi segment lower lobe atelectasis and trace pleural effusions. Musculoskeletal: No acute or aggressive finding. CT ABDOMEN PELVIS FINDINGS Hepatobiliary: Collection along the left lobe described below. No primary hepatic finding. High-density gallbladder in this patient is NPO per chart. Pancreas: Negative Spleen: Subcapsular collection extending left subdiaphragmatic, 11 x 6 cm compared to 8 x 5 cm previously. Adrenals/Urinary Tract: Right renal cystic density. No hydronephrosis. High-density lesion lower left kidney compatible with proteinaceous cyst. Thick appearance of the bladder dome which may be from underdistention. No surrounding inflammation. Recent catheterization with small volume pneumaturia. Stomach/Bowel: Recent perforated cecum with small bowel and cecal resection. There is an end ileostomy, reportedly clinically  functional. No obstruction or ileus changes. Previously administered oral contrast seen in the excluded colon. Vascular/Lymphatic: Atherosclerotic calcification. No acute vascular finding. Reproductive: Negative Other: Hemoperitoneum seen throughout the gutters, interloop spaces, pelvis, and bilateral of shallow inguinal hernias. The clot has become more hazy and ill-defined consistent with liquefaction. Collections in the gutters have increased in volume which may be from liquefaction or superimposed progressive infection. A drain has been placed along the superficial liver. Although in close continuity a left ventral perihepatic collection is still large at 12 x 3 cm. fluid around the right liver is significantly improved. Small volume fluid in the hepatic renal fossa. Trace pneumoperitoneum not unexpected after recent surgeries. Musculoskeletal: No acute finding. Severe lumbar disc and facet degeneration Open midline wound. IMPRESSION: 1. History of hemoperitoneum and peritoneal abscess. Increasing pelvic and gutter fluid may be related to hematoma liquefaction or progressive infection. Areas of high-density have decreased from prior, no indication of interval hemorrhage. 2. Progressive perisplenic collection/abscess, 11 x 6 cm. 3. Improved perihepatic collection after drain placement, although there is still sizable collection along the ventral left liver at 12 x 3 cm. The tip of the catheter is central to this persistent collection. 4. Postoperative bowel without ileus or obstruction. 5. Lower lobe atelectasis and trace effusions. No consolidating pneumonia. 6. Open laparotomy wound.  No undermining or  collection. Electronically Signed   By: Marnee SpringJonathon  Watts M.D.   On: 08/07/2017 15:57   Ct Chest Wo Contrast  Result Date: 08/07/2017 CLINICAL DATA:  Abdominal distention. History perforated and ischemic cecum. Exploratory laparotomy for abdominal abscess and pelvic hematoma. EXAM: CT CHEST, ABDOMEN AND PELVIS  WITHOUT CONTRAST TECHNIQUE: Multidetector CT imaging of the chest, abdomen and pelvis was performed following the standard protocol without IV contrast. COMPARISON:  Abdomen and pelvis CT from 8 days ago FINDINGS: CT CHEST FINDINGS Cardiovascular: Normal heart size. No pericardial effusion. PICC on the right with tip at the SVC. Mediastinum/Nodes: Negative for adenopathy. Lungs/Pleura: Multi segment lower lobe atelectasis and trace pleural effusions. Musculoskeletal: No acute or aggressive finding. CT ABDOMEN PELVIS FINDINGS Hepatobiliary: Collection along the left lobe described below. No primary hepatic finding. High-density gallbladder in this patient is NPO per chart. Pancreas: Negative Spleen: Subcapsular collection extending left subdiaphragmatic, 11 x 6 cm compared to 8 x 5 cm previously. Adrenals/Urinary Tract: Right renal cystic density. No hydronephrosis. High-density lesion lower left kidney compatible with proteinaceous cyst. Thick appearance of the bladder dome which may be from underdistention. No surrounding inflammation. Recent catheterization with small volume pneumaturia. Stomach/Bowel: Recent perforated cecum with small bowel and cecal resection. 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 No obstruction or ileus changes. Previously administered oral contrast seen in the excluded colon. Vascular/Lymphatic: Atherosclerotic calcification. No acute vascular finding. Reproductive: Negative Other: Hemoperitoneum seen throughout the gutters, interloop spaces, pelvis, and bilateral of shallow inguinal hernias. The clot has become more hazy and ill-defined consistent with liquefaction. Collections in the gutters have increased in volume which may be from liquefaction or superimposed progressive infection. A drain has been placed along the superficial liver. Although in close continuity a left ventral perihepatic collection is still large at 12 x 3 cm. fluid around the right liver is  significantly improved. Small volume fluid in the hepatic renal fossa. Trace pneumoperitoneum not unexpected after recent surgeries. Musculoskeletal: No acute finding. Severe lumbar disc and facet degeneration Open midline wound. IMPRESSION: 1. History of hemoperitoneum and peritoneal abscess. Increasing pelvic and gutter fluid may be related to hematoma liquefaction or progressive infection. Areas of high-density have decreased from prior, no indication of interval hemorrhage. 2. Progressive perisplenic collection/abscess, 11 x 6 cm. 3. Improved perihepatic collection after drain placement, although there is still sizable collection along the ventral left liver at 12 x 3 cm. The tip of the catheter is central to this persistent collection. 4. Postoperative bowel without ileus or obstruction. 5. Lower lobe atelectasis and trace effusions. No consolidating pneumonia. 6. Open laparotomy wound.  No undermining or collection. Electronically Signed   By: Marnee SpringJonathon  Watts M.D.   On: 08/07/2017 15:57   Dg Chest Port 1 View  Result Date: 08/29/2017 CLINICAL DATA:  Dyspnea EXAM: PORTABLE CHEST 1 VIEW COMPARISON:  08/17/2017 FINDINGS: Right PICC line tip: SVC. Stable appearance of scarring or atelectasis along the right lateral costophrenic angle. Improved aeration in the left retrocardiac region. The lungs remain otherwise clear. The prior transvenous pacer lead is absent. Heart size within normal limits. IMPRESSION: 1. Scarring at the right lung base. Slightly improved aeration in the left lower lobe with some minimal linear residual opacities. Electronically Signed   By: Gaylyn RongWalter  Liebkemann M.D.   On: 08/29/2017 11:32   Dg Chest Port 1 View  Result Date: 08/17/2017 CLINICAL DATA:  Temporary cardiac pacer. EXAM: PORTABLE CHEST 1 VIEW COMPARISON:  08/10/2017 FINDINGS: Placement of a single lead pacer from right  internal jugular approach. This terminates over the right ventricle. There is also a right sided PICC line  which terminates at the low SVC. Midline trachea. Borderline cardiomegaly. Mild right hemidiaphragm elevation. Possible trace right pleural fluid or thickening. No pneumothorax. No congestive failure. Bibasilar atelectasis or scar. Extubation. IMPRESSION: Placement of a pacer by a right internal jugular approach, without acute complication. Electronically Signed   By: Jeronimo Greaves M.D.   On: 08/17/2017 10:50   Dg Chest Port 1 View  Result Date: 08/10/2017 CLINICAL DATA:  Respiratory failure. Former smoker. Follow-up exam. EXAM: PORTABLE CHEST 1 VIEW COMPARISON:  08/09/2017 FINDINGS: There is persistent lung base opacity consistent with atelectasis. Remainder of the lungs is clear. Endotracheal tube and right PICC are stable in well positioned. Suspect small pleural effusions.  No pneumothorax. IMPRESSION: 1. No change from the previous day's study. 2. Persistent basilar atelectasis. 3. Support apparatus is stable and well positioned. Electronically Signed   By: Amie Portland M.D.   On: 08/10/2017 07:04   Dg Chest Port 1 View  Result Date: 08/09/2017 CLINICAL DATA:  Respiratory failure. EXAM: PORTABLE CHEST 1 VIEW COMPARISON:  Radiograph of August 08, 2017. FINDINGS: Stable cardiomediastinal silhouette. Endotracheal tube is in grossly good position. Right-sided PICC line is unchanged in position. No pneumothorax is noted. Mild bibasilar subsegmental atelectasis is noted with probable minimal pleural effusions. Bony thorax is unremarkable. IMPRESSION: Endotracheal tube in grossly good position. Stable bibasilar subsegmental atelectasis with minimal pleural effusions. Electronically Signed   By: Lupita Raider, M.D.   On: 08/09/2017 08:50   Dg Chest Port 1 View  Result Date: 08/08/2017 CLINICAL DATA:  Post intubation. EXAM: PORTABLE CHEST 1 VIEW COMPARISON:  CT yesterday FINDINGS: Endotracheal tube 3.8 cm from the carina. Right upper extremity PICC tip in the distal SVC. Low lung volumes. Mild  cardiomegaly. Small bilateral pleural effusions and associated atelectasis. No pulmonary edema or pneumothorax. IMPRESSION: 1. Endotracheal tube 3.8 cm from the carina. Right upper extremity PICC in place. 2. Low lung volumes with bibasilar atelectasis and small pleural effusions. Electronically Signed   By: Rubye Oaks M.D.   On: 08/08/2017 03:28   Dg Chest Port 1 View  Result Date: 08/06/2017 CLINICAL DATA:  Shortness of breath. EXAM: PORTABLE CHEST 1 VIEW COMPARISON:  Radiograph August 02, 2017. FINDINGS: Stable cardiomediastinal silhouette. No pneumothorax is noted. Endotracheal tube has been removed. Interval placement of right-sided PICC line with distal tip in expected position of cavoatrial junction. Mild bibasilar subsegmental atelectasis or infiltrates are noted. Probable small left pleural effusion is noted. Bony thorax is unremarkable. IMPRESSION: Interval placement of right-sided PICC line with tip in expected position of cavoatrial junction. Stable bibasilar subsegmental atelectasis or infiltrates are noted, with small left pleural effusion. Electronically Signed   By: Lupita Raider, M.D.   On: 08/06/2017 07:47   Dg Chest Port 1 View  Result Date: 08/02/2017 CLINICAL DATA:  Intubation. EXAM: PORTABLE CHEST 1 VIEW COMPARISON:  08/01/2017. FINDINGS: Interim removal of NG tube. Endotracheal tube in stable position. Left subclavian central line stable position. Heart size normal. Low lung volumes with bibasilar atelectasis. Small bilateral pleural effusions. Similar findings noted on prior exam. No pneumothorax. IMPRESSION: 1. Pole of NG tube. Endotracheal tube left subclavian central line stable position. 2. Low lung volumes with mild bibasilar atelectasis. Small bilateral pleural effusions. No change from prior exam . Electronically Signed   By: Maisie Fus  Register   On: 08/02/2017 06:21   Dg Abd Portable 1v  Result  Date: 08/20/2017 CLINICAL DATA:  Initial evaluation for abdominal  pain. History of multiple abdominal surgeries. EXAM: PORTABLE ABDOMEN - 1 VIEW COMPARISON:  Prior CT from 08/15/2017. FINDINGS: Bowel gas pattern is within normal limits without evidence for obstruction or ileus. Percutaneous pigtail drains overlie the left upper and right upper quadrants. Additional draining at the subhepatic right upper quadrant. Right lower quadrant colostomy in place. No soft tissue mass or abnormal calcification. No free air on this limited supine view the abdomen. Prominent degenerative changes noted within the lower lumbar spine about the hips bilaterally. Probable atelectatic changes noted at the lung bases. IMPRESSION: 1. Nonobstructive bowel gas pattern with no radiographic evidence for acute intra-abdominal pathology. 2. Multiple surgical drains in place as above with right lower quadrant colostomy. Electronically Signed   By: Rise Mu M.D.   On: 08/20/2017 22:26   Dg Abd Portable 1v  Result Date: 08/10/2017 CLINICAL DATA:  62 year old male status post nasogastric tube placement EXAM: PORTABLE ABDOMEN - 1 VIEW COMPARISON:  CT scan of the abdomen and pelvis 08/08/2017 FINDINGS: A nasogastric tube is been placed. The tip overlies the stomach. The proximal side hole is in the region of the gastroesophageal junction. The bowel gas pattern is not obstructed. Patchy airspace opacity in the left lower lobe consistent with known effusion and atelectasis. Left upper quadrant pigtail drainage catheter. IMPRESSION: 1. The proximal side hole of the gastric tube overlies the gastroesophageal junction. Recommend advancing 3-5 cm for more optimal placement. 2. Left-sided pleural effusion and associated atelectasis versus infiltrate. Electronically Signed   By: Malachy Moan M.D.   On: 08/10/2017 15:25   Ct Image Guided Drainage By Percutaneous Catheter  Result Date: 08/17/2017 INDICATION: 62 year old male with a history of abdominal fluid collection. He has been referred for  drainage EXAM: CT GUIDED DRAINAGE OF ABDOMINAL ABSCESS MEDICATIONS: The patient is currently admitted to the hospital and receiving intravenous antibiotics. The antibiotics were administered within an appropriate time frame prior to the initiation of the procedure. ANESTHESIA/SEDATION: 1.0 mg IV Versed 50 mcg IV Fentanyl Moderate Sedation Time:  17 MINUTES The patient was continuously monitored during the procedure by the interventional radiology nurse under my direct supervision. COMPLICATIONS: NONE TECHNIQUE: Informed written consent was obtained from the patient after a thorough discussion of the procedural risks, benefits and alternatives. All questions were addressed. Maximal Sterile Barrier Technique was utilized including caps, mask, sterile gowns, sterile gloves, sterile drape, hand hygiene and skin antiseptic. A timeout was performed prior to the initiation of the procedure. PROCEDURE: The right lower quadrant was prepped with chlorhexidine in a sterile fashion, and a sterile drape was applied covering the operative field. A sterile gown and sterile gloves were used for the procedure. Local anesthesia was provided with 1% Lidocaine. Patient was positioned supine position on the CT gantry table. Scout CT was acquired for planning purposes. The patient is then prepped and draped in the usual sterile fashion. 1% lidocaine was for local anesthesia. Using CT guidance, trocar needle was advanced into the heterogeneously dense fluid collection of the right lower quadrant. Once we confirmed needle position, modified Seldinger technique was used to place a 12 French pigtail catheter drain. Once the drain was position within the fluid collection, approximately 40 cc of dark greenish fluid aspirated. Sample was sent to the lab and for culture. Catheter was sutured in position and attached to bulb suction. Patient tolerated the procedure well and remained hemodynamically stable throughout. No complications were  encountered and no significant blood  loss. FINDINGS: Scout CT demonstrates heterogeneously dense fluid within the right lower quadrant, posterior to the surgical site of the right colon. Re- demonstration of multiple surgical drains of the right upper quadrant. Re- demonstration of midline abdominal wound. Approximately 40 cc of dark greenish fluid aspirated from the fluid collection. Final CT demonstrates pigtail drainage catheter within the fluid collection. IMPRESSION: Status post CT-guided drainage of fluid collection in the right lower quadrant. Sample was sent for culture and for total bilirubin given the color/consistency. Signed, Yvone Neu. Loreta Ave, DO [facility] Electronically Signed   By: Gilmer Mor D.O.   On: 08/17/2017 12:42   Ct Image Guided Drainage By Percutaneous Catheter  Result Date: 08/08/2017 INDICATION: History of small bowel perforation, cecal ischemia and hemoperitoneum with multiple recent intra-abdominal surgery is including creation of an ostomy in wound VAC placement. Patient remains critically ill now with persistent fluid collection within the left upper abdominal quadrant. Please perform percutaneous drainage catheter placement for infection source control purposes. EXAM: CT IMAGE GUIDED DRAINAGE BY PERCUTANEOUS CATHETER COMPARISON:  CT chest, abdomen and pelvis - 08/07/2017 MEDICATIONS: The patient is currently admitted to the hospital and receiving intravenous antibiotics. The antibiotics were administered within an appropriate time frame prior to the initiation of the procedure. ANESTHESIA/SEDATION: Moderate (conscious) sedation was employed during this procedure. A total of Versed 1 mg and Fentanyl 50 mcg was administered intravenously. Moderate Sedation Time: 14 minutes. The patient's level of consciousness and vital signs were monitored continuously by radiology nursing throughout the procedure under my direct  supervision. CONTRAST:  None COMPLICATIONS: None immediate. PROCEDURE: Informed written consent was obtained from the patient's family after a discussion of the risks, benefits and alternatives to treatment. The patient was placed prone on the CT gantry and a pre procedural CT was performed re-demonstrating the known abscess/fluid collection within the left upper abdominal quadrant adjacent to the spleen with dominant lenticular component measuring approximately 12.0 x 4.1 cm (image 20, series 2). The procedure was planned. A timeout was performed prior to the initiation of the procedure. The skin overlying the left upper abdomen was prepped and draped in the usual sterile fashion. The overlying soft tissues were anesthetized with 1% lidocaine with epinephrine. Appropriate trajectory was planned with the use of a 22 gauge spinal needle. An 18 gauge trocar needle was advanced into the abscess/fluid collection and a short Amplatz super stiff wire was coiled within the collection. Appropriate positioning was confirmed with a limited CT scan. The tract was serially dilated allowing placement of a 10 [place] all-purpose drainage catheter. Appropriate positioning was confirmed with a limited postprocedural CT scan. Approximately 50 cc of purulent fluid was aspirated. The tube was connected to a JP bulb and sutured in place. A dressing was placed. The patient tolerated the procedure well without immediate post procedural complication. IMPRESSION: Successful CT guided placement of a 10 French all purpose drain catheter into the left upper abdominal with aspiration of 50 cc of purulent fluid. Samples were sent to the laboratory as requested by the ordering clinical team. Electronically Signed   By: Simonne Come M.D.   On: 08/08/2017 18:18    Microbiology: No results found for this or any previous visit (from the past 240 hour(s)).   Labs: Basic Metabolic Panel: Recent Labs  Lab 08/26/17 0339 08/28/17 0428  08/29/17 0437 08/29/17 2116 08/30/17 0520  NA 141 138 138 130* 136  K 4.0 4.0 4.3 3.9 3.9  CL 112* 109 109  103 108  CO2 22 19* 20* 19* 20*  GLUCOSE 101* 116* 103* 101* 122*  BUN 29* 40* 45* 42* 42*  CREATININE 1.91* 2.09* 2.23* 1.97* 2.04*  CALCIUM 8.4* 9.0 8.9 8.2* 8.6*   Liver Function Tests: No results for input(s): AST, ALT, ALKPHOS, BILITOT, PROT, ALBUMIN in the last 168 hours. No results for input(s): LIPASE, AMYLASE in the last 168 hours. No results for input(s): AMMONIA in the last 168 hours. CBC: Recent Labs  Lab 08/25/17 0340 08/26/17 0339 08/29/17 0437 08/30/17 0908  WBC 10.3 10.5 13.4* 12.0*  HGB 7.3* 7.8* 9.2* 9.2*  HCT 24.0* 25.9* 29.7* 29.7*  MCV 92.0 92.5 92.2 92.2  PLT 478* 505* 509* 552*   Cardiac Enzymes: No results for input(s): CKTOTAL, CKMB, CKMBINDEX, TROPONINI in the last 168 hours. BNP: BNP (last 3 results) No results for input(s): BNP in the last 8760 hours.  ProBNP (last 3 results) No results for input(s): PROBNP in the last 8760 hours.  CBG: Recent Labs  Lab 08/24/17 1148 08/24/17 1616 08/24/17 2355 08/25/17 0812  GLUCAP 144* 119* 91 88       Signed:  Darlin Drop, MD [facility] 08/31/2017, 10:57 AM

## 2017-08-31 NOTE — NC FL2 (Signed)
[place] MEDICAID FL2 LEVEL OF CARE SCREENING TOOL     IDENTIFICATION  Patient Name: Caleb Singh Birthdate: 07-05-1956 Sex: male Admission Date (Current Location): 07/23/2017  [facility] and [place] Number:  Producer, television/film/video and Address:  [facility], [address], [place], [place] [address]      Provider Number: [id number]  Attending Physician Name and Address:  Darlin Drop, DO  Relative Name and Phone Number:       Current Level of Care: Hospital Recommended Level of Care: Skilled Nursing Facility Prior Approval Number:    Date Approved/Denied:   PASRR Number: [id number]  Discharge Plan: SNF    Current Diagnoses: Patient Active Problem List   Diagnosis Date Noted  . Protein-calorie malnutrition, severe 08/28/2017  . Abdominopelvic abscess (HCC)   . Bradycardia   . Postoperative intra-abdominal abscess   . Acute encephalopathy   . DNR (do not resuscitate) discussion   . Palliative care by specialist   . Weakness generalized   . Septic shock (HCC)   . Pressure injury of skin 08/07/2017  . Tachypnea   . Acute delirium   . Intestinal perforation s/p SB resection/ileostomy 07/24/2017 & 07/25/2017 07/27/2017  . Ileostomy in place [facility]) 07/27/2017  . Acute respiratory failure (HCC)   . AKI (acute kidney injury) (HCC)   . Free intraperitoneal air 07/23/2017  . Abscess of third finger of right hand 04/17/2016    Orientation RESPIRATION BLADDER Height & Weight     Self, Time, Situation, Place  Normal Continent Weight: 149 lb 14.6 oz (68 kg) Height:  5\' 10"  (177.8 cm)  BEHAVIORAL SYMPTOMS/MOOD NEUROLOGICAL BOWEL NUTRITION STATUS      Incontinent, Ileostomy Diet(Soft)  AMBULATORY STATUS COMMUNICATION OF NEEDS Skin   Limited Assist Verbally Surgical wounds, PU Stage and Appropriate Care(abdominal wound, gauze dressing 2x day)   PU Stage 2 Dressing: No Dressing(scrotum)                   Personal Care Assistance Level of  Assistance  Bathing, Dressing Bathing Assistance: Maximum assistance   Dressing Assistance: Maximum assistance     Functional Limitations Info             SPECIAL CARE FACTORS FREQUENCY  PT (By licensed PT), OT (By licensed OT)     PT Frequency: 5 days per week OT Frequency: 5 days per week            Contractures      Additional Factors Info  Code Status, Allergies, Insulin Sliding Scale Code Status Info: DNR Allergies Info: NKA   Insulin Sliding Scale Info: 3 times per day       Current Medications (08/31/2017):  This is the current hospital active medication list Current Facility-Administered Medications  Medication Dose Route Frequency Provider Last Rate Last Dose  . 0.9 %  sodium chloride infusion   Intravenous Continuous Darlin Drop, DO 75 mL/hr at 08/30/17 1900    . acetaminophen (TYLENOL) tablet 650 mg  650 mg Oral Q6H PRN Coralyn Helling, MD      . amLODipine (NORVASC) tablet 5 mg  5 mg Oral Daily Sheilah Pigeon, PA-C   5 mg at 08/31/17 0957  . chlorhexidine (PERIDEX) 0.12 % solution 15 mL  15 mL Mouth Rinse BID Kalman Shan, MD   15 mL at 08/31/17 0957  . Chlorhexidine Gluconate Cloth 2 % PADS 6 each  6 each Topical Daily Kalman Shan, MD   6 each  at 08/31/17 1000  . ciprofloxacin (CIPRO) tablet 500 mg  500 mg Oral BID Canary Brimllis, Brandi L, NP   500 mg at 08/31/17 0818  . feeding supplement (ENSURE ENLIVE) (ENSURE ENLIVE) liquid 237 mL  237 mL Oral TID BM Leslye PeerByrum, Robert S, MD   237 mL at 08/31/17 0958  . heparin injection 5,000 Units  5,000 Units Subcutaneous Q8H Barnetta Chapelsborne, Kelly, PA-C   5,000 Units at 08/31/17 [id number]  . hydrALAZINE (APRESOLINE) tablet 25 mg  25 mg Oral Q8H Ollis, Brandi L, NP   25 mg at 08/31/17 0508  . iopamidol (ISOVUE-300) 61 % injection 15 mL  15 mL Oral BID PRN Kalman Shanamaswamy, Murali, MD   15 mL at 08/22/17 0930  . lip balm (BLISTEX) ointment   Topical PRN Kalman Shanamaswamy, Murali, MD      . loperamide (IMODIUM) capsule 2 mg  2 mg Oral Q8H Manus Ruddsuei,  Matthew, MD   2 mg at 08/31/17 [id number]  . LORazepam (ATIVAN) injection 0.5 mg  0.5 mg Intravenous Q12H PRN Gaynelle AduWilson, Eric, MD   0.5 mg at 08/30/17 2049  . MEDLINE mouth rinse  15 mL Mouth Rinse q12n4p Kalman Shanamaswamy, Murali, MD   15 mL at 08/29/17 1642  . menthol-cetylpyridinium (CEPACOL) lozenge 3 mg  1 lozenge Oral PRN Emelia LoronWakefield, Matthew, MD      . metroNIDAZOLE (FLAGYL) tablet 500 mg  500 mg Oral Q12H Ollis, Brandi L, NP   500 mg at 08/31/17 0957  . morphine 4 MG/ML injection 2-4 mg  2-4 mg Intravenous Q3H PRN Deterding, Dorise HissElizabeth C, MD   4 mg at 08/24/17 2159  . nitroGLYCERIN (NITROSTAT) SL tablet 0.4 mg  0.4 mg Sublingual Q5 min PRN Deterding, Dorise HissElizabeth C, MD   0.4 mg at 08/13/17 0455  . ondansetron (ZOFRAN) injection 4 mg  4 mg Intravenous Q6H PRN Bensimhon, Bevelyn Bucklesaniel R, MD   4 mg at 08/23/17 2226  . psyllium (HYDROCIL/METAMUCIL) packet 1 packet  1 packet Oral Daily Emelia LoronWakefield, Matthew, MD   1 packet at 08/31/17 1003  . sodium chloride flush (NS) 0.9 % injection 10-40 mL  10-40 mL Intracatheter Q12H Kalman Shanamaswamy, Murali, MD   10 mL at 08/30/17 2200  . sodium chloride flush (NS) 0.9 % injection 10-40 mL  10-40 mL Intracatheter PRN Kalman Shanamaswamy, Murali, MD   10 mL at 08/09/17 1711     Discharge Medications: Please see discharge summary for a list of discharge medications.  Relevant Imaging Results:  Relevant Lab Results:   Additional Information SSN [id number]  Margarito LinerSarah C Ramanda Paules, LCSW

## 2017-08-31 NOTE — Clinical Social Work Note (Addendum)
[name] has given the patient's bed away. According to clinical liaison, they will need to re-evaluate MD/therapy notes and cost of meds.  Charlynn CourtSarah Brendalee Matthies, CSW [phone]  12:42 pm [facility] [place] [facility].  Charlynn CourtSarah Rukiya Hodgkins, CSW [phone]

## 2017-08-31 NOTE — Clinical Social Work Placement (Signed)
   CLINICAL SOCIAL WORK PLACEMENT  NOTE  Date:  08/31/2017  Patient Details  Name: Caleb LaundryFrederick Klecker MRN: [id number] Date of Birth: 1956/02/13  Clinical Social Work is seeking post-discharge placement for this patient at the Skilled  Nursing Facility level of care (*CSW will initial, date and re-position this form in  chart as items are completed):  Yes   Patient/family provided with [place] Clinical Social Work Department's list of facilities offering this level of care within the geographic area requested by the patient (or if unable, by the patient's family).  Yes   Patient/family informed of their freedom to choose among providers that offer the needed level of care, that participate in Medicare, Medicaid or managed care program needed by the patient, have an available bed and are willing to accept the patient.  Yes   Patient/family informed of [facility]'s ownership interest in [facility] and [facility], as well as of the fact that they are under no obligation to receive care at these facilities.  PASRR submitted to EDS on 08/23/17     PASRR number received on 08/23/17     Existing PASRR number confirmed on       FL2 transmitted to all facilities in geographic area requested by pt/family on 08/23/17     FL2 transmitted to all facilities within larger geographic area on       Patient informed that his/her managed care company has contracts with or will negotiate with certain facilities, including the following:        Yes   Patient/family informed of bed offers received.  [facility]     Physician recommends and patient chooses bed at      [facility].  Patient to be transferred to facility by [facility]     Patient family notified on 08/31/17 of transfer.  Name of family member notified:  Patient declined to have CSW call his sister.       PHYSICIAN Please prepare prescriptions     Additional Comment:    _______________________________________________ Margarito LinerSarah C Annaleia Pence, LCSW 08/31/2017, 12:55 PM

## 2017-08-31 NOTE — Progress Notes (Signed)
18 Days Post-Op   Subjective/Chief Complaint: Feels well today, 2800 out ileostomy, appetite better    Objective: Vital signs in last 24 hours: Temp:  [97.6 F (36.4 C)-99.1 F (37.3 C)] 97.6 F (36.4 C) (01/12 0623) Pulse Rate:  [95-105] 95 (01/12 0623) Resp:  [20] 20 (01/12 0623) BP: (132-151)/(83-95) 151/88 (01/12 0623) SpO2:  [98 %-100 %] 100 % (01/12 0623) Last BM Date: 08/30/17  Intake/Output from previous day: 01/11 0701 - 01/12 0700 In: 3146.3 [P.O.:1315; I.V.:1831.3] Out: 3880 [Urine:1080; Stool:2800] Intake/Output this shift: No intake/output data recorded.  Gen: Alert, NAD, pleasant Card: RRR Pulm:  clear Abd: Soft,ND, NT, +BS,open midline incision with sutures visible/ fibrinous exudate at the base but good granulation tissue at the edges, ileostomy pink and functioning with liquid stool in bag    Lab Results:  Recent Labs    08/29/17 0437 08/30/17 0908  WBC 13.4* 12.0*  HGB 9.2* 9.2*  HCT 29.7* 29.7*  PLT 509* 552*   BMET Recent Labs    08/29/17 2116 08/30/17 0520  NA 130* 136  K 3.9 3.9  CL 103 108  CO2 19* 20*  GLUCOSE 101* 122*  BUN 42* 42*  CREATININE 1.97* 2.04*  CALCIUM 8.2* 8.6*    Studies/Results: Dg Chest Port 1 View  Result Date: 08/29/2017 CLINICAL DATA:  Dyspnea EXAM: PORTABLE CHEST 1 VIEW COMPARISON:  08/17/2017 FINDINGS: Right PICC line tip: SVC. Stable appearance of scarring or atelectasis along the right lateral costophrenic angle. Improved aeration in the left retrocardiac region. The lungs remain otherwise clear. The prior transvenous pacer lead is absent. Heart size within normal limits. IMPRESSION: 1. Scarring at the right lung base. Slightly improved aeration in the left lower lobe with some minimal linear residual opacities. Electronically Signed   By: Gaylyn Rong M.D.   On: 08/29/2017 11:32    Anti-infectives: Anti-infectives (From admission, onward)   Start     Dose/Rate Route Frequency Ordered Stop   08/25/17 1200  ciprofloxacin (CIPRO) tablet 500 mg     500 mg Oral 2 times daily 08/25/17 1154     08/25/17 1200  metroNIDAZOLE (FLAGYL) tablet 500 mg     500 mg Oral Every 12 hours 08/25/17 1154     08/10/17 0500  vancomycin (VANCOCIN) 1,500 mg in sodium chloride 0.9 % 500 mL IVPB  Status:  Discontinued     1,500 mg 250 mL/hr over 120 Minutes Intravenous Every 48 hours 08/08/17 0728 08/12/17 1018   08/08/17 2200  anidulafungin (ERAXIS) 100 mg in sodium chloride 0.9 % 100 mL IVPB  Status:  Discontinued     100 mg 78 mL/hr over 100 Minutes Intravenous Every 24 hours 08/08/17 0524 08/12/17 1018   08/08/17 0445  vancomycin (VANCOCIN) 1,500 mg in sodium chloride 0.9 % 500 mL IVPB     1,500 mg 250 mL/hr over 120 Minutes Intravenous  Once 08/08/17 0431 08/08/17 0659   08/08/17 0430  vancomycin (VANCOCIN) 1,250 mg in sodium chloride 0.9 % 250 mL IVPB  Status:  Discontinued     1,250 mg 166.7 mL/hr over 90 Minutes Intravenous  Once 08/08/17 0418 08/08/17 0428   08/08/17 0415  anidulafungin (ERAXIS) 200 mg in sodium chloride 0.9 % 200 mL IVPB     200 mg 78 mL/hr over 200 Minutes Intravenous  Once 08/08/17 0413 08/08/17 0816   08/05/17 1000  piperacillin-tazobactam (ZOSYN) IVPB 3.375 g  Status:  Discontinued     3.375 g 12.5 mL/hr over 240 Minutes Intravenous Every 8 hours  08/05/17 0946 08/24/17 0945   08/02/17 1400  meropenem (MERREM) 1 g in sodium chloride 0.9 % 100 mL IVPB  Status:  Discontinued     1 g 200 mL/hr over 30 Minutes Intravenous Every 12 hours 08/02/17 1111 08/05/17 0945   07/29/17 0600  piperacillin-tazobactam (ZOSYN) IVPB 3.375 g  Status:  Discontinued     3.375 g 12.5 mL/hr over 240 Minutes Intravenous Every 8 hours 07/29/17 0518 08/02/17 1036   07/25/17 2000  vancomycin (VANCOCIN) IVPB 1000 mg/200 mL premix  Status:  Discontinued     1,000 mg 200 mL/hr over 60 Minutes Intravenous Every 48 hours 07/23/17 2042 07/24/17 0956   07/24/17 2200  piperacillin-tazobactam (ZOSYN) 3.375  g in dextrose 5 % 50 mL IVPB  Status:  Discontinued     3.375 g 100 mL/hr over 30 Minutes Intravenous Every 6 hours 07/24/17 2006 07/29/17 0518   07/24/17 2100  anidulafungin (ERAXIS) 100 mg in sodium chloride 0.9 % 100 mL IVPB  Status:  Discontinued     100 mg 78 mL/hr over 100 Minutes Intravenous Every 24 hours 07/23/17 2039 08/05/17 0945   07/24/17 0000  anidulafungin (ERAXIS) 100 mg in sodium chloride 0.9 % 100 mL IVPB  Status:  Discontinued     100 mg 78 mL/hr over 100 Minutes Intravenous Every 24 hours 07/23/17 2038 07/23/17 2039   07/23/17 2200  piperacillin-tazobactam (ZOSYN) IVPB 3.375 g  Status:  Discontinued     3.375 g 12.5 mL/hr over 240 Minutes Intravenous Every 8 hours 07/23/17 2043 07/24/17 2006   07/23/17 2100  anidulafungin (ERAXIS) 200 mg in sodium chloride 0.9 % 200 mL IVPB     200 mg 78 mL/hr over 200 Minutes Intravenous  Once 07/23/17 2038 07/24/17 0239   07/23/17 2100  vancomycin (VANCOCIN) 1,500 mg in sodium chloride 0.9 % 500 mL IVPB     1,500 mg 250 mL/hr over 120 Minutes Intravenous  Once 07/23/17 2041 07/23/17 2317   07/23/17 1515  piperacillin-tazobactam (ZOSYN) IVPB 3.375 g     3.375 g 100 mL/hr over 30 Minutes Intravenous  Once 07/23/17 1511 07/23/17 1611      Assessment/Plan: Perforated distal small boweland ischemia of cecum 1.S/Pexploratory laparotomy, small bowel resection without anastomosis, placement of open wound VAC system 07/23/17 Dr. Gaynelle AduEric Wilson 2. S/p Reexploration of abdomen, ileocecectomy, creation of end ileostomy,closure of abdominal wall, 07/25/17, Dr. Almond LintFaera Byerly 3. Exploratory laparotomy, drainage of abdominal abscess and evacuation of pelvic hematoma, RUQ drain, and left lateral drain placement, 07/31/17, Dr. Glenna FellowsBenjamin Hoxworth  Bacteremia - Enterobacteriaceae + E Coli Cardiac arrest - Code Blue 08/08/17  Acute renal failure-CRRT discontinued 07/27/17. Malnutrition/Deconditioning -taking insoft diet(due toissues with  dentition),andEnsure GI-needs continued dressing changes, wound isdehisced but seems to be scarred into place. High risk for hernia, lower risk for evisceration IRdrained collection posterior to anastomosis- drains removed ZO:XWRUE:cipro and flagyl 1/6>> (planning 5-7 days oral antibiotics post removal of last drain) AVW:UJWJVT:SCDs - Heparin Follow-up:Dr. Gaynelle AduEric Wilson  Plan:Ready for d/c to SNF from surgical standpoint. Monitor ileostomy output closely as this can dehydrate patient if it gets too high. Encouraged patient to drink more fluids throughout the day. Continue BID wet to dry dressing changes. Cipro/flagyl x5-7 days post removal last drain 1/6.will add fiber to immodium    Emelia LoronMatthew Laquandra Carrillo 08/31/2017

## 2017-09-10 ENCOUNTER — Encounter (HOSPITAL_COMMUNITY): Payer: Self-pay | Admitting: *Deleted

## 2017-09-10 ENCOUNTER — Inpatient Hospital Stay (HOSPITAL_COMMUNITY): Payer: Non-veteran care

## 2017-09-10 ENCOUNTER — Other Ambulatory Visit: Payer: Self-pay

## 2017-09-10 ENCOUNTER — Emergency Department (HOSPITAL_COMMUNITY): Payer: Non-veteran care

## 2017-09-10 ENCOUNTER — Inpatient Hospital Stay (HOSPITAL_COMMUNITY)
Admission: EM | Admit: 2017-09-10 | Discharge: 2017-09-21 | DRG: 208 | Disposition: A | Discharge status: Skilled Nursing Facility | Payer: Non-veteran care | Attending: Internal Medicine | Admitting: Internal Medicine

## 2017-09-10 DIAGNOSIS — E872 Acidosis, unspecified: Secondary | ICD-10-CM | POA: Diagnosis present

## 2017-09-10 DIAGNOSIS — I82220 Acute embolism and thrombosis of inferior vena cava: Secondary | ICD-10-CM | POA: Diagnosis present

## 2017-09-10 DIAGNOSIS — M7989 Other specified soft tissue disorders: Secondary | ICD-10-CM | POA: Diagnosis not present

## 2017-09-10 DIAGNOSIS — J9690 Respiratory failure, unspecified, unspecified whether with hypoxia or hypercapnia: Secondary | ICD-10-CM

## 2017-09-10 DIAGNOSIS — D62 Acute posthemorrhagic anemia: Secondary | ICD-10-CM | POA: Diagnosis not present

## 2017-09-10 DIAGNOSIS — I513 Intracardiac thrombosis, not elsewhere classified: Secondary | ICD-10-CM | POA: Diagnosis present

## 2017-09-10 DIAGNOSIS — Z86711 Personal history of pulmonary embolism: Secondary | ICD-10-CM

## 2017-09-10 DIAGNOSIS — I361 Nonrheumatic tricuspid (valve) insufficiency: Secondary | ICD-10-CM | POA: Diagnosis not present

## 2017-09-10 DIAGNOSIS — I82443 Acute embolism and thrombosis of tibial vein, bilateral: Secondary | ICD-10-CM | POA: Diagnosis present

## 2017-09-10 DIAGNOSIS — Z800 Family history of malignant neoplasm of digestive organs: Secondary | ICD-10-CM | POA: Diagnosis not present

## 2017-09-10 DIAGNOSIS — I468 Cardiac arrest due to other underlying condition: Secondary | ICD-10-CM | POA: Diagnosis not present

## 2017-09-10 DIAGNOSIS — Z79899 Other long term (current) drug therapy: Secondary | ICD-10-CM | POA: Diagnosis not present

## 2017-09-10 DIAGNOSIS — N179 Acute kidney failure, unspecified: Secondary | ICD-10-CM | POA: Diagnosis present

## 2017-09-10 DIAGNOSIS — J449 Chronic obstructive pulmonary disease, unspecified: Secondary | ICD-10-CM

## 2017-09-10 DIAGNOSIS — I4510 Unspecified right bundle-branch block: Secondary | ICD-10-CM | POA: Diagnosis present

## 2017-09-10 DIAGNOSIS — Z932 Ileostomy status: Secondary | ICD-10-CM

## 2017-09-10 DIAGNOSIS — D689 Coagulation defect, unspecified: Secondary | ICD-10-CM | POA: Diagnosis present

## 2017-09-10 DIAGNOSIS — E8770 Fluid overload, unspecified: Secondary | ICD-10-CM | POA: Diagnosis present

## 2017-09-10 DIAGNOSIS — R748 Abnormal levels of other serum enzymes: Secondary | ICD-10-CM | POA: Diagnosis not present

## 2017-09-10 DIAGNOSIS — I82493 Acute embolism and thrombosis of other specified deep vein of lower extremity, bilateral: Secondary | ICD-10-CM | POA: Diagnosis present

## 2017-09-10 DIAGNOSIS — J9600 Acute respiratory failure, unspecified whether with hypoxia or hypercapnia: Secondary | ICD-10-CM | POA: Diagnosis present

## 2017-09-10 DIAGNOSIS — N184 Chronic kidney disease, stage 4 (severe): Secondary | ICD-10-CM | POA: Diagnosis not present

## 2017-09-10 DIAGNOSIS — I2699 Other pulmonary embolism without acute cor pulmonale: Secondary | ICD-10-CM | POA: Diagnosis present

## 2017-09-10 DIAGNOSIS — G9341 Metabolic encephalopathy: Secondary | ICD-10-CM | POA: Diagnosis not present

## 2017-09-10 DIAGNOSIS — Z9049 Acquired absence of other specified parts of digestive tract: Secondary | ICD-10-CM | POA: Diagnosis not present

## 2017-09-10 DIAGNOSIS — Z4659 Encounter for fitting and adjustment of other gastrointestinal appliance and device: Secondary | ICD-10-CM

## 2017-09-10 DIAGNOSIS — I240 Acute coronary thrombosis not resulting in myocardial infarction: Secondary | ICD-10-CM

## 2017-09-10 DIAGNOSIS — I129 Hypertensive chronic kidney disease with stage 1 through stage 4 chronic kidney disease, or unspecified chronic kidney disease: Secondary | ICD-10-CM | POA: Diagnosis present

## 2017-09-10 DIAGNOSIS — K7200 Acute and subacute hepatic failure without coma: Secondary | ICD-10-CM | POA: Diagnosis present

## 2017-09-10 DIAGNOSIS — R7989 Other specified abnormal findings of blood chemistry: Secondary | ICD-10-CM | POA: Diagnosis not present

## 2017-09-10 DIAGNOSIS — I2602 Saddle embolus of pulmonary artery with acute cor pulmonale: Secondary | ICD-10-CM | POA: Diagnosis not present

## 2017-09-10 DIAGNOSIS — R570 Cardiogenic shock: Secondary | ICD-10-CM | POA: Diagnosis not present

## 2017-09-10 DIAGNOSIS — I959 Hypotension, unspecified: Secondary | ICD-10-CM

## 2017-09-10 DIAGNOSIS — E876 Hypokalemia: Secondary | ICD-10-CM | POA: Diagnosis not present

## 2017-09-10 DIAGNOSIS — R740 Nonspecific elevation of levels of transaminase and lactic acid dehydrogenase [LDH]: Secondary | ICD-10-CM | POA: Diagnosis not present

## 2017-09-10 DIAGNOSIS — Z808 Family history of malignant neoplasm of other organs or systems: Secondary | ICD-10-CM | POA: Diagnosis not present

## 2017-09-10 DIAGNOSIS — Z87891 Personal history of nicotine dependence: Secondary | ICD-10-CM

## 2017-09-10 DIAGNOSIS — Z6821 Body mass index (BMI) 21.0-21.9, adult: Secondary | ICD-10-CM

## 2017-09-10 DIAGNOSIS — Z8249 Family history of ischemic heart disease and other diseases of the circulatory system: Secondary | ICD-10-CM | POA: Diagnosis not present

## 2017-09-10 DIAGNOSIS — J9601 Acute respiratory failure with hypoxia: Secondary | ICD-10-CM | POA: Diagnosis present

## 2017-09-10 DIAGNOSIS — R1310 Dysphagia, unspecified: Secondary | ICD-10-CM | POA: Diagnosis not present

## 2017-09-10 DIAGNOSIS — Z8674 Personal history of sudden cardiac arrest: Secondary | ICD-10-CM | POA: Diagnosis not present

## 2017-09-10 DIAGNOSIS — I469 Cardiac arrest, cause unspecified: Secondary | ICD-10-CM | POA: Diagnosis not present

## 2017-09-10 DIAGNOSIS — R2231 Localized swelling, mass and lump, right upper limb: Secondary | ICD-10-CM | POA: Diagnosis not present

## 2017-09-10 DIAGNOSIS — Z978 Presence of other specified devices: Secondary | ICD-10-CM

## 2017-09-10 DIAGNOSIS — E440 Moderate protein-calorie malnutrition: Secondary | ICD-10-CM | POA: Diagnosis present

## 2017-09-10 DIAGNOSIS — R042 Hemoptysis: Secondary | ICD-10-CM | POA: Diagnosis not present

## 2017-09-10 DIAGNOSIS — R739 Hyperglycemia, unspecified: Secondary | ICD-10-CM | POA: Diagnosis not present

## 2017-09-10 DIAGNOSIS — J9811 Atelectasis: Secondary | ICD-10-CM | POA: Diagnosis not present

## 2017-09-10 DIAGNOSIS — R000 Tachycardia, unspecified: Secondary | ICD-10-CM

## 2017-09-10 DIAGNOSIS — E860 Dehydration: Secondary | ICD-10-CM | POA: Diagnosis present

## 2017-09-10 DIAGNOSIS — Z933 Colostomy status: Secondary | ICD-10-CM | POA: Diagnosis not present

## 2017-09-10 DIAGNOSIS — R0902 Hypoxemia: Secondary | ICD-10-CM | POA: Diagnosis not present

## 2017-09-10 DIAGNOSIS — G9340 Encephalopathy, unspecified: Secondary | ICD-10-CM | POA: Diagnosis not present

## 2017-09-10 DIAGNOSIS — I82403 Acute embolism and thrombosis of unspecified deep veins of lower extremity, bilateral: Secondary | ICD-10-CM | POA: Diagnosis not present

## 2017-09-10 DIAGNOSIS — A419 Sepsis, unspecified organism: Secondary | ICD-10-CM | POA: Diagnosis not present

## 2017-09-10 DIAGNOSIS — Z66 Do not resuscitate: Secondary | ICD-10-CM | POA: Diagnosis present

## 2017-09-10 DIAGNOSIS — Z7901 Long term (current) use of anticoagulants: Secondary | ICD-10-CM | POA: Diagnosis not present

## 2017-09-10 DIAGNOSIS — I2609 Other pulmonary embolism with acute cor pulmonale: Principal | ICD-10-CM

## 2017-09-10 DIAGNOSIS — K9403 Colostomy malfunction: Secondary | ICD-10-CM | POA: Diagnosis not present

## 2017-09-10 DIAGNOSIS — S45991A Other specified injury of unspecified blood vessel at shoulder and upper arm level, right arm, initial encounter: Secondary | ICD-10-CM

## 2017-09-10 DIAGNOSIS — J90 Pleural effusion, not elsewhere classified: Secondary | ICD-10-CM | POA: Diagnosis not present

## 2017-09-10 DIAGNOSIS — K631 Perforation of intestine (nontraumatic): Secondary | ICD-10-CM | POA: Diagnosis present

## 2017-09-10 DIAGNOSIS — N183 Chronic kidney disease, stage 3 (moderate): Secondary | ICD-10-CM | POA: Diagnosis present

## 2017-09-10 DIAGNOSIS — Z0189 Encounter for other specified special examinations: Secondary | ICD-10-CM

## 2017-09-10 HISTORY — DX: Pneumothorax, unspecified: J93.9

## 2017-09-10 HISTORY — DX: Unspecified intestinal obstruction, unspecified as to partial versus complete obstruction: K56.609

## 2017-09-10 LAB — POCT I-STAT 3, ART BLOOD GAS (G3+)
ACID-BASE DEFICIT: 11 mmol/L — AB (ref 0.0–2.0)
ACID-BASE DEFICIT: 19 mmol/L — AB (ref 0.0–2.0)
Bicarbonate: 12.1 mmol/L — ABNORMAL LOW (ref 20.0–28.0)
Bicarbonate: 19 mmol/L — ABNORMAL LOW (ref 20.0–28.0)
O2 SAT: 33 %
O2 Saturation: 100 %
PCO2 ART: 26.7 mmHg — AB (ref 32.0–48.0)
PH ART: 7.296 — AB (ref 7.350–7.450)
PO2 ART: 30 mmHg — AB (ref 83.0–108.0)
PO2 ART: 337 mmHg — AB (ref 83.0–108.0)
Patient temperature: 13
TCO2: 14 mmol/L — ABNORMAL LOW (ref 22–32)
TCO2: 21 mmol/L — ABNORMAL LOW (ref 22–32)
pCO2 arterial: 53.2 mmHg — ABNORMAL HIGH (ref 32.0–48.0)
pH, Arterial: 6.959 — CL (ref 7.350–7.450)

## 2017-09-10 LAB — I-STAT ARTERIAL BLOOD GAS, ED
ACID-BASE DEFICIT: 10 mmol/L — AB (ref 0.0–2.0)
Bicarbonate: 16 mmol/L — ABNORMAL LOW (ref 20.0–28.0)
O2 SAT: 100 %
PH ART: 7.288 — AB (ref 7.350–7.450)
Patient temperature: 98.4
TCO2: 17 mmol/L — AB (ref 22–32)
pCO2 arterial: 33.3 mmHg (ref 32.0–48.0)
pO2, Arterial: 580 mmHg — ABNORMAL HIGH (ref 83.0–108.0)

## 2017-09-10 LAB — URINALYSIS, ROUTINE W REFLEX MICROSCOPIC
BILIRUBIN URINE: NEGATIVE
GLUCOSE, UA: 50 mg/dL — AB
HGB URINE DIPSTICK: NEGATIVE
KETONES UR: NEGATIVE mg/dL
LEUKOCYTES UA: NEGATIVE
NITRITE: NEGATIVE
PROTEIN: 100 mg/dL — AB
Specific Gravity, Urine: 1.017 (ref 1.005–1.030)
pH: 5 (ref 5.0–8.0)

## 2017-09-10 LAB — ECHOCARDIOGRAM COMPLETE
AVLVOTPG: 2 mmHg
E decel time: 121 msec
E/e' ratio: 6.64
FS: 46 % — AB (ref 28–44)
Height: 70 in
IV/PV OW: 1
LA diam end sys: 14 mm
LA diam index: 0.77 cm/m2
LASIZE: 14 mm
LAVOLA4C: 21.2 mL
LV E/e'average: 6.64
LV e' LATERAL: 8.81 cm/s
LVEEMED: 6.64
LVOT VTI: 12.3 cm
LVOT area: 2.84 cm2
LVOT peak vel: 76.7 cm/s
LVOTD: 19 mm
LVOTSV: 35 mL
MV Dec: 121
MV pk E vel: 58.5 m/s
MVAP: 3.86 cm2
MVPKAVEL: 89.7 m/s
MVSPHT: 36 ms
PW: 12 mm — AB (ref 0.6–1.1)
RV TAPSE: 10.2 mm
RV sys press: 32 mmHg
Reg peak vel: 244 cm/s
TDI e' lateral: 8.81
TDI e' medial: 6.09
TRMAXVEL: 244 cm/s
Weight: 2398.6 oz

## 2017-09-10 LAB — BLOOD GAS, ARTERIAL
ACID-BASE DEFICIT: 14.7 mmol/L — AB (ref 0.0–2.0)
Bicarbonate: 10.5 mmol/L — ABNORMAL LOW (ref 20.0–28.0)
DRAWN BY: 227661
O2 Content: 4 L/min
O2 Saturation: 98 %
PCO2 ART: 22 mmHg — AB (ref 32.0–48.0)
PH ART: 7.303 — AB (ref 7.350–7.450)
PO2 ART: 110 mmHg — AB (ref 83.0–108.0)
Patient temperature: 98.6

## 2017-09-10 LAB — COMPREHENSIVE METABOLIC PANEL
ALT: 20 U/L (ref 17–63)
AST: 29 U/L (ref 15–41)
Albumin: 2.7 g/dL — ABNORMAL LOW (ref 3.5–5.0)
Alkaline Phosphatase: 193 U/L — ABNORMAL HIGH (ref 38–126)
Anion gap: 19 — ABNORMAL HIGH (ref 5–15)
BILIRUBIN TOTAL: 0.8 mg/dL (ref 0.3–1.2)
BUN: 46 mg/dL — ABNORMAL HIGH (ref 6–20)
CHLORIDE: 92 mmol/L — AB (ref 101–111)
CO2: 12 mmol/L — ABNORMAL LOW (ref 22–32)
CREATININE: 2.76 mg/dL — AB (ref 0.61–1.24)
Calcium: 9.2 mg/dL (ref 8.9–10.3)
GFR, EST AFRICAN AMERICAN: 27 mL/min — AB (ref >60)
GFR, EST NON AFRICAN AMERICAN: 23 mL/min — AB (ref >60)
Glucose, Bld: 221 mg/dL — ABNORMAL HIGH (ref 65–99)
POTASSIUM: 5 mmol/L (ref 3.5–5.1)
Sodium: 123 mmol/L — ABNORMAL LOW (ref 135–145)
TOTAL PROTEIN: 8.8 g/dL — AB (ref 6.5–8.1)

## 2017-09-10 LAB — I-STAT CG4 LACTIC ACID, ED
Lactic Acid, Venous: 7.79 mmol/L (ref 0.5–1.9)
Lactic Acid, Venous: 3.59 mmol/L (ref 0.5–1.9)

## 2017-09-10 LAB — GLUCOSE, CAPILLARY: GLUCOSE-CAPILLARY: 351 mg/dL — AB (ref 65–99)

## 2017-09-10 LAB — HEMOGLOBIN A1C
HEMOGLOBIN A1C: 5.2 % (ref 4.8–5.6)
MEAN PLASMA GLUCOSE: 102.54 mg/dL

## 2017-09-10 LAB — CBC
HEMATOCRIT: 32.1 % — AB (ref 39.0–52.0)
Hemoglobin: 10.4 g/dL — ABNORMAL LOW (ref 13.0–17.0)
MCH: 29 pg (ref 26.0–34.0)
MCHC: 32.4 g/dL (ref 30.0–36.0)
MCV: 89.4 fL (ref 78.0–100.0)
PLATELETS: 475 10*3/uL — AB (ref 150–400)
RBC: 3.59 MIL/uL — ABNORMAL LOW (ref 4.22–5.81)
RDW: 15.3 % (ref 11.5–15.5)
WBC: 22.8 10*3/uL — ABNORMAL HIGH (ref 4.0–10.5)

## 2017-09-10 LAB — PROCALCITONIN: Procalcitonin: 2.33 ng/mL

## 2017-09-10 LAB — TROPONIN I
TROPONIN I: 0.09 ng/mL — AB (ref <0.03)
TROPONIN I: 1.26 ng/mL — AB (ref <0.03)
TROPONIN I: 1.33 ng/mL — AB (ref <0.03)

## 2017-09-10 LAB — BRAIN NATRIURETIC PEPTIDE: B NATRIURETIC PEPTIDE 5: 189.9 pg/mL — AB (ref 0.0–100.0)

## 2017-09-10 LAB — LACTIC ACID, PLASMA: Lactic Acid, Venous: 10.5 mmol/L (ref 0.5–1.9)

## 2017-09-10 MED ORDER — PANTOPRAZOLE SODIUM 40 MG IV SOLR
40.0000 mg | Freq: Every day | INTRAVENOUS | Status: DC
  Administered 2017-09-11 – 2017-09-12 (×2): 40 mg via INTRAVENOUS
  Filled 2017-09-10 (×2): qty 40

## 2017-09-10 MED ORDER — SODIUM BICARBONATE 8.4 % IV SOLN
50.0000 meq | Freq: Once | INTRAVENOUS | Status: AC
  Administered 2017-09-10: 50 meq via INTRAVENOUS

## 2017-09-10 MED ORDER — SODIUM CHLORIDE 0.9 % IV SOLN: 250.0000 mL | INTRAVENOUS | Status: DC | PRN

## 2017-09-10 MED ORDER — SODIUM BICARBONATE 8.4 % IV SOLN
INTRAVENOUS | Status: DC
  Administered 2017-09-10 – 2017-09-12 (×5): via INTRAVENOUS
  Filled 2017-09-10 (×9): qty 150

## 2017-09-10 MED ORDER — FENTANYL 2500MCG IN NS 250ML (10MCG/ML) PREMIX INFUSION
25.0000 ug/h | INTRAVENOUS | Status: DC
  Administered 2017-09-11: 100 ug/h via INTRAVENOUS
  Administered 2017-09-11 – 2017-09-12 (×3): 300 ug/h via INTRAVENOUS
  Filled 2017-09-10 (×4): qty 250

## 2017-09-10 MED ORDER — SODIUM CHLORIDE 0.9 % IV BOLUS (SEPSIS)
1000.0000 mL | Freq: Once | INTRAVENOUS | Status: AC
  Administered 2017-09-10: 1000 mL via INTRAVENOUS

## 2017-09-10 MED ORDER — HEPARIN BOLUS VIA INFUSION
4000.0000 [IU] | Freq: Once | INTRAVENOUS | Status: AC
  Administered 2017-09-10: 4000 [IU] via INTRAVENOUS
  Filled 2017-09-10: qty 4000

## 2017-09-10 MED ORDER — SODIUM BICARBONATE 8.4 % IV SOLN
50.0000 meq | INTRAVENOUS | Status: AC
  Administered 2017-09-10: 50 meq via INTRAVENOUS
  Filled 2017-09-10: qty 50

## 2017-09-10 MED ORDER — IOPAMIDOL (ISOVUE-300) INJECTION 61%
INTRAVENOUS | Status: AC
  Administered 2017-09-10: 18:00:00
  Filled 2017-09-10: qty 30

## 2017-09-10 MED ORDER — MIDAZOLAM HCL 2 MG/2ML IJ SOLN
1.0000 mg | INTRAMUSCULAR | Status: DC | PRN
  Administered 2017-09-12: 1 mg via INTRAVENOUS
  Filled 2017-09-10 (×2): qty 2

## 2017-09-10 MED ORDER — LACTATED RINGERS IV SOLN: INTRAVENOUS | Status: DC

## 2017-09-10 MED ORDER — PH 12 STERILE DILUENT
2.0000 ng/kg/min | INTRAVENOUS | Status: DC
  Filled 2017-09-10: qty 0.1

## 2017-09-10 MED ORDER — FENTANYL CITRATE (PF) 100 MCG/2ML IJ SOLN
50.0000 ug | Freq: Once | INTRAMUSCULAR | Status: AC
  Administered 2017-09-10: 50 ug via INTRAVENOUS
  Filled 2017-09-10: qty 2

## 2017-09-10 MED ORDER — INSULIN ASPART 100 UNIT/ML ~~LOC~~ SOLN
0.0000 [IU] | SUBCUTANEOUS | Status: DC
  Administered 2017-09-11: 9 [IU] via SUBCUTANEOUS

## 2017-09-10 MED ORDER — INSULIN ASPART 100 UNIT/ML ~~LOC~~ SOLN: 0.0000 [IU] | Freq: Three times a day (TID) | SUBCUTANEOUS | Status: DC

## 2017-09-10 MED ORDER — SODIUM CHLORIDE 0.9 % IV SOLN
500.0000 mg | Freq: Two times a day (BID) | INTRAVENOUS | Status: AC
  Administered 2017-09-10 – 2017-09-15 (×10): 500 mg via INTRAVENOUS
  Filled 2017-09-10 (×10): qty 0.5

## 2017-09-10 MED ORDER — MIDAZOLAM HCL 2 MG/2ML IJ SOLN
1.0000 mg | INTRAMUSCULAR | Status: DC | PRN
  Administered 2017-09-11 (×2): 1 mg via INTRAVENOUS
  Filled 2017-09-10: qty 2

## 2017-09-10 MED ORDER — SODIUM CHLORIDE 0.9 % IV SOLN
0.0000 ug/min | INTRAVENOUS | Status: DC
  Administered 2017-09-10 (×2): 180 ug/min via INTRAVENOUS
  Filled 2017-09-10 (×2): qty 10

## 2017-09-10 MED ORDER — SODIUM BICARBONATE 8.4 % IV SOLN
INTRAVENOUS | Status: AC
  Administered 2017-09-10: 50 meq
  Filled 2017-09-10: qty 50

## 2017-09-10 MED ORDER — HEPARIN (PORCINE) IN NACL 100-0.45 UNIT/ML-% IJ SOLN
1100.0000 [IU]/h | INTRAMUSCULAR | Status: DC
  Administered 2017-09-10: 1100 [IU]/h via INTRAVENOUS
  Filled 2017-09-10: qty 250

## 2017-09-10 MED ORDER — FENTANYL BOLUS VIA INFUSION
25.0000 ug | INTRAVENOUS | Status: DC | PRN
  Filled 2017-09-10: qty 25

## 2017-09-10 MED ORDER — NOREPINEPHRINE BITARTRATE 1 MG/ML IV SOLN
0.0000 ug/min | INTRAVENOUS | Status: DC
  Administered 2017-09-11: 80 ug/min via INTRAVENOUS
  Filled 2017-09-10: qty 4

## 2017-09-10 MED ORDER — TENECTEPLASE 50 MG IV KIT
35.0000 mg | PACK | INTRAVENOUS | Status: AC
  Administered 2017-09-10: 35 mg via INTRAVENOUS
  Filled 2017-09-10: qty 10

## 2017-09-10 MED ORDER — DOCUSATE SODIUM 50 MG/5ML PO LIQD
100.0000 mg | Freq: Two times a day (BID) | ORAL | Status: DC | PRN
  Filled 2017-09-10: qty 10

## 2017-09-10 MED ORDER — ORAL CARE MOUTH RINSE
15.0000 mL | Freq: Four times a day (QID) | OROMUCOSAL | Status: DC
  Administered 2017-09-11 – 2017-09-12 (×6): 15 mL via OROMUCOSAL

## 2017-09-10 MED ORDER — TECHNETIUM TC 99M DIETHYLENETRIAME-PENTAACETIC ACID
30.0000 | Freq: Once | INTRAVENOUS | Status: AC | PRN
  Administered 2017-09-10: 30 via RESPIRATORY_TRACT

## 2017-09-10 MED ORDER — CHLORHEXIDINE GLUCONATE 0.12% ORAL RINSE (MEDLINE KIT)
15.0000 mL | Freq: Two times a day (BID) | OROMUCOSAL | Status: DC
  Administered 2017-09-11 – 2017-09-12 (×4): 15 mL via OROMUCOSAL

## 2017-09-10 MED ORDER — SODIUM CHLORIDE 0.9 % IV SOLN
Freq: Once | INTRAVENOUS | Status: AC
  Administered 2017-09-10: 1000 mL via INTRAVENOUS

## 2017-09-10 MED ORDER — TECHNETIUM TO 99M ALBUMIN AGGREGATED
4.0000 | Freq: Once | INTRAVENOUS | Status: AC | PRN
  Administered 2017-09-10: 4 via INTRAVENOUS

## 2017-09-10 MED ORDER — SODIUM BICARBONATE 8.4 % IV SOLN
INTRAVENOUS | Status: AC
Start: 2017-09-10 — End: 2017-09-11
  Filled 2017-09-10: qty 50

## 2017-09-10 MED ORDER — ASPIRIN 81 MG PO CHEW: 324.0000 mg | CHEWABLE_TABLET | ORAL | Status: DC

## 2017-09-10 MED ORDER — SODIUM CHLORIDE 0.9 % IV SOLN
INTRAVENOUS | Status: DC
  Administered 2017-09-10 – 2017-09-13 (×3): via INTRAVENOUS

## 2017-09-10 MED ORDER — EPINEPHRINE PF 1 MG/ML IJ SOLN
0.5000 ug/min | INTRAMUSCULAR | Status: DC
Start: 2017-09-10 — End: 2017-09-11
  Administered 2017-09-10: 20 ug/min via INTRAVENOUS
  Filled 2017-09-10: qty 4

## 2017-09-10 MED ORDER — FENTANYL CITRATE (PF) 100 MCG/2ML IJ SOLN
50.0000 ug | INTRAMUSCULAR | Status: DC | PRN
Start: 2017-09-10 — End: 2017-09-16
  Administered 2017-09-11 – 2017-09-12 (×5): 100 ug via INTRAVENOUS

## 2017-09-10 MED ORDER — ASPIRIN 300 MG RE SUPP: 300.0000 mg | RECTAL | Status: DC

## 2017-09-10 MED ORDER — VASOPRESSIN 20 UNIT/ML IV SOLN
0.0300 [IU]/min | INTRAVENOUS | Status: DC
  Administered 2017-09-10 – 2017-09-11 (×2): 0.03 [IU]/min via INTRAVENOUS
  Filled 2017-09-10 (×2): qty 2

## 2017-09-10 MED ORDER — FAMOTIDINE IN NACL 20-0.9 MG/50ML-% IV SOLN
20.0000 mg | Freq: Two times a day (BID) | INTRAVENOUS | Status: DC
  Filled 2017-09-10: qty 50

## 2017-09-10 NOTE — Progress Notes (Addendum)
Following the first ROSC, patient shortly thereafter had a second cardiac arrest 2226-2230, again PEA rhythm. He received more epi and bicarb during this code. Following ROSC patient was profoundly hypoxic unable to be placed back on vent even on max vent settings. Also more hypotensive now started on 3 vasopressors. Planning for emergent CVC and Aline now. Patient's family (sister, daughter, son-in-law) have arrived and were updated of his condition and grave prognosis. Have consulted IR for consideration of possible embolectomy since he has now failed thrombolytics.   Update: IR (Dr Bonnielee HaffHoss) says that [facility]. Patient is too unstable for transport at this time. The only other option would be ECMO, and I think he is too unstable for this also do not know if the systemic TPA he received is a contraindication for cannulation. Will start inhaled Flolan now to help improve VQ mismatch and oxygenation. Will call thoracic surgery consult to evaluate for a surgical embolectomy, although I am not sure he would tolerate that. Prognosis is unfortunately grim.  Nonprocedural critical care time: 60 minutes

## 2017-09-10 NOTE — Progress Notes (Signed)
Pt ordered CT scan with oral contrast. Patient removed from bipap and placed on 4lpm nasal cannula. Patient in no distress at this time. RN aware.

## 2017-09-10 NOTE — Progress Notes (Signed)
Patient had just returned from a VQ scan which he was "unable to tolerate" per RN. He was on BIPAP when he became unresponsive and brady to 20's then lost a pulse with PEA rhythm. CPR was started. Patient received Epinephrine, Bicarb x 3, Calcium x 1. CPR was from 2152 until 2155 when ROSC was attained. IV TPA pushed out of concern that this arrest was from a PE given that patient had known intracardiac clot. TTE reported "large elongated mobile mass measuring 4.63cm x 1 cm in the RA that intermittently protrudes through the TV c/w thrombus. There is also suspicion of thrombus in the IVC."   Following ROSC, patient was hypotensive and started on levophed gtt. I suspect he is preload dependent from this massive PE, so started IVF bolus and increased bicarb gtt. Unable to place central line at this time since he just received TPA. Will re-eval based on his pressor requirements. Sent new labs now. Repeat ABG. Patient intubated after ROSC attained as he did not regain consciousness. Post intubation CXR pending. He is not a candidate for hypothermia protocol. Obtain new TTE and EKG.

## 2017-09-10 NOTE — Procedures (Signed)
Arterial Catheter Insertion Procedure Note Caleb LaundryFrederick Singh [id number] 06-Nov-1955  Procedure: Insertion of Arterial Catheter  Indications: Blood pressure monitoring and Frequent blood sampling  Procedure Details Consent: Unable to obtain consent because of emergent medical necessity. Time Out: Verified patient identification, verified procedure, site/side was marked, verified correct patient position, special equipment/implants available, medications/allergies/relevent history reviewed, required imaging and test results available.  Performed  Maximum sterile technique was used including antiseptics, cap, gloves, gown, hand hygiene, mask and sheet. Skin prep: Chlorhexidine; local anesthetic administered 20 gauge catheter was inserted into left femoral artery using the Seldinger technique.  Line sutured, bio-patch applied, and sterile dressing placed.    Evaluation Blood flow good; BP tracing good. Complications: No apparent complications.  Procedure performed with ultrasound guidance for real time vessel cannulation.     Caleb Singh, AGACNP-BC [facility] Pgr: [phone] or if no answer [phone] 09/10/2017, 11:51 PM

## 2017-09-10 NOTE — Progress Notes (Signed)
Dr. Wallace CullensGray updated frequently after admit to MICU, including report of critical values.  Orders recvd and Dr. Wallace CullensGray at bs at 1830. Family updated to condition, upcoming test and px.

## 2017-09-10 NOTE — Procedures (Signed)
Endotracheal Intubation Procedure Note Indication for endotracheal intubation: respiratory failure Sedation: none Paralytic: none Equipment: Macintosh 3 laryngoscope blade and 7.545mm cuffed endotracheal tube Cricoid Pressure: yes Number of attempts: 1 ETT location confirmed by by auscultation, by CXR and ETCO2 monitor.   Intubated emergently during cardiac arrest

## 2017-09-10 NOTE — Progress Notes (Signed)
ANTICOAGULATION CONSULT NOTE - Initial Consult  Pharmacy Consult for heparin Indication: r/o PE  No Known Allergies  Patient Measurements: Height: 5\' 10"  (177.8 cm) Weight: 149 lb 14.6 oz (68 kg) IBW/kg (Calculated) : 73 Heparin Dosing Weight: 68kg  Vital Signs: BP: 103/86 (01/22 1245) Pulse Rate: 126 (01/22 1150)  Labs: Recent Labs    09/10/17 1123  HGB 10.4*  HCT 32.1*  PLT 475*  CREATININE 2.76*  TROPONINI 0.09*    Estimated Creatinine Clearance: 26.3 mL/min (A) (by C-G formula based on SCr of 2.76 mg/dL (H)).   Medical History: Past Medical History:  Diagnosis Date  . Bowel obstruction (HCC)   . Pneumothorax     Medications:  Infusions:  . heparin    . sodium chloride      Assessment: 61 yom presented to the ED with SOB and CP. Suspected PE and now placing on IV heparin. Baseline H/H is slightly low and platelets are WNL. Troponin is mildly elevated. CT scan is pending. He is not on anticoagulation PTA.   Goal of Therapy:  Heparin level 0.3-0.7 units/ml Monitor platelets by anticoagulation protocol: Yes   Plan:  Heparin bolus 4000 units IV x 1 Heparin gtt 1100 units/hr Check an 8 hr heparin level Daily heparin level and CBC F/u CT scan results  Caleb Singh, Caleb Singh 09/10/2017,1:23 PM

## 2017-09-10 NOTE — ED Notes (Signed)
Patient c/o pain at the end of his tailbone, no breakdown noted, sacral pad applied.

## 2017-09-10 NOTE — Progress Notes (Signed)
Order for BIPAP. Patient in obvious respiratory distress with RR 33 and labored with accessory muscle use. Unable to obtain accurate sat at this time. Placed patient on BIPAP 12/6 100%. Pt states he is comfortable. Will continue to monitor. RN at bedside.

## 2017-09-10 NOTE — Progress Notes (Signed)
  Echocardiogram 2D Echocardiogram has been performed.  Janalyn HarderWest, Chosen Geske R 09/10/2017, 3:23 PM

## 2017-09-10 NOTE — Code Documentation (Signed)
Responded to code blue x 2; PCCM attending, NP, and ICU residents were bedside and running the code.

## 2017-09-10 NOTE — Progress Notes (Signed)
Pt taken to V/Q scan, pt lying flat on scan table pt went unresponsive, HR 100's unable to get pt's O2 sats. Pt bagged with ambu bag, RRT called. Pt b/c responsive and said he was just tried and very fatigue due to lack of sleep. Pt returned to ICU and  Dr. Frances FurbishWinfrey notified.

## 2017-09-10 NOTE — ED Notes (Signed)
Sister Estelle JuneDesirae Davis called [phone] at patient request. [facility].

## 2017-09-10 NOTE — ED Notes (Signed)
Pt removed from Bi Pap and placed on 02 via [place].  Pt tolerating well.  CT tech brought pt water to drink for CT ABD with contrast but pt st's he can not drink it at this time.

## 2017-09-10 NOTE — Progress Notes (Signed)
Patient resting comfortably on 4L [place]. No respiratory distress noted. BIPAP not needed at this time. RT will monitor as needed. 

## 2017-09-10 NOTE — Progress Notes (Signed)
Dr. Wallace CullensGray informed that after several attempts with different cables and sites, we are unable to assess O2 sats. Order recvd to assess ABG.

## 2017-09-10 NOTE — H&P (Signed)
PULMONARY / CRITICAL CARE MEDICINE   Name: Caleb Singh MRN: [id number] DOB: [birth year]    ADMISSION DATE:  09/10/2017   CHIEF COMPLAINT: Dyspnea  HISTORY OF PRESENT ILLNESS:        This is a 62 year old who suffered from a perforated small bowel on December 24.  He underwent small bowel resection and subsequently suffered from multisystem failure and required CRRT.  He had an intra-abdominal abscess which was drained percutaneously.  He was discharged to rehab where he has been having difficulties with chronic dyspnea.  He reports that he is having fairly high output from his ostomy in addition he tells me that he has a 40-pack-year history of smoking.  Today he became acutely more dyspneic after being ambulated by the physical therapist.  He is describing some chest tightness which is not clearly pleuritic.  He seems to think the chest tightness is exacerbated by exertion.  He does have a prior history of pulmonary embolism, he denies any asymmetric swelling in his lower extremity or any other painful cords in his legs.  He is not having any change in his chronic cough is not having fevers chills or rigors.  He denies a prior known history of coronary artery disease specifically denies prior myocardial infarction or angina.  PAST MEDICAL HISTORY :  He  has a past medical history of Bowel obstruction (HCC) and Pneumothorax.  PAST SURGICAL HISTORY: He  has a past surgical history that includes I&D extremity (Right, 04/17/2016); I&D extremity (Right, 04/19/2016); laparotomy (N/A, 07/23/2017); laparotomy (N/A, 07/25/2017); laparotomy (N/A, 07/31/2017); LEFT HEART CATH AND CORONARY ANGIOGRAPHY (N/A, 08/13/2017); and Colostomy.  No Known Allergies  No current facility-administered medications on file prior to encounter.    Current Outpatient Medications on File Prior to Encounter  Medication Sig  . amLODipine (NORVASC) 5 MG tablet Take 1 tablet (5 mg total) by mouth daily.  . fluticasone  furoate-vilanterol (BREO ELLIPTA) 100-25 MCG/INH AEPB Inhale 1 puff into the lungs daily.  . feeding supplement, ENSURE ENLIVE, (ENSURE ENLIVE) LIQD Take 237 mLs by mouth 3 (three) times daily between meals.  . hydrALAZINE (APRESOLINE) 25 MG tablet Take 1 tablet (25 mg total) by mouth every 8 (eight) hours.  Marland Kitchen loperamide (IMODIUM) 2 MG capsule Take 1 capsule (2 mg total) by mouth every 8 (eight) hours.  . psyllium (HYDROCIL/METAMUCIL) 95 % PACK Take 1 packet by mouth daily.    FAMILY HISTORY:  His has no family status information on file.    SOCIAL HISTORY: He  reports that he has quit smoking. he has never used smokeless tobacco. He reports that he drinks alcohol. He reports that he does not use drugs.  He has a 40-pack-year smoking history.  REVIEW OF SYSTEMS:   Since discharge from the hospital to rehab he has been getting about primarily in a wheelchair.  He is having dyspnea with minimal exertion even before his acute decline today.  Tells me that he is constantly thirsty and often has a high output from his ileostomy bag.  He is not known to previously be glucose intolerant.  He does have a prior history of pulmonary embolism as noted.  The remainder of a 10 system review of systems is noncontributory. SUBJECTIVE:  As above  VITAL SIGNS: BP 107/70   Pulse (!) 126   Resp (!) 32   Ht 5\' 10"  (1.778 m)   Wt 149 lb 14.6 oz (68 kg)   SpO2 100%   BMI 21.51 kg/m   HEMODYNAMICS:  VENTILATOR SETTINGS: FiO2 (%):  [100 %] 100 %  INTAKE / OUTPUT: No intake/output data recorded.  PHYSICAL EXAMINATION: General: He is thin to cachectic and somewhat tachypneic although not overtly distressed on BiPAP.  He is able to speak in complete sentences. Neuro: He is appropriately interactive and moving all fours. Cardiovascular: S1 and S2 are distant without murmur rub or gallop.  There is a healed scar over approximately the fourth rib just off the sternal border on the left. Lungs: He is  somewhat tachypneic but not labored.  There is symmetric air movement a few scattered rhonchi and no wheezes. Abdomen: There is a right upper quadrant ostomy and a dressing over his midline wound.  The abdomen is flat without any organomegaly masses tenderness guarding or rebound.  Some bowel sounds are present. Skin: There is no dependent edema.  LABS:  BMET Recent Labs  Lab 09/10/17 1123  NA 123*  K 5.0  CL 92*  CO2 12*  BUN 46*  CREATININE 2.76*  GLUCOSE 221*    Electrolytes Recent Labs  Lab 09/10/17 1123  CALCIUM 9.2    CBC Recent Labs  Lab 09/10/17 1123  WBC 22.8*  HGB 10.4*  HCT 32.1*  PLT 475*    Coag's No results for input(s): APTT, INR in the last 168 hours.  Sepsis Markers Recent Labs  Lab 09/10/17 1138 09/10/17 1349  LATICACIDVEN 7.79* 3.59*    ABG Recent Labs  Lab 09/10/17 1206  PHART 7.288*  PCO2ART 33.3  PO2ART 580.0*    Liver Enzymes Recent Labs  Lab 09/10/17 1123  AST 29  ALT 20  ALKPHOS 193*  BILITOT 0.8  ALBUMIN 2.7*    Cardiac Enzymes Recent Labs  Lab 09/10/17 1123  TROPONINI 0.09*    Glucose No results for input(s): GLUCAP in the last 168 hours.  Imaging Dg Chest Portable 1 View  Result Date: 09/10/2017 CLINICAL DATA:  Sudden onset shortness of breath, low O2 sats EXAM: PORTABLE CHEST 1 VIEW COMPARISON:  08/29/2017 FINDINGS: Heart and mediastinal contours are within normal limits. No focal opacities or effusions. No acute bony abnormality. IMPRESSION: No active disease. Electronically Signed   By: Charlett NoseKevin  Dover M.D.   On: 09/10/2017 11:35      ANTIBIOTICS: Meropenem started on 1/22    DISCUSSION:      This is a 62 year old with a recent protracted admission for intra-abdominal sepsis.  He has a prior history of pulmonary embolism.  He presents today with acute superimposed on chronic dyspnea.  He describes a sensation of chest tightness primarily associated with exertion and not clearly pleuritic.  He has  some chronic cough which is unchanged.  He is also noted to have a substantial metabolic acidosis.  ASSESSMENT / PLAN:  PULMONARY A: He has a good deal of baseline dyspnea and I suspect is underlying COPD however he also has a significant metabolic acidosis increasing his respiratory demands.  We will be correcting the metabolic acidosis looking for a provocation for the component that is due to lactate.  He has a high ostomy output which I suspect accounts for much of his low bicarbonate.  He also had an acute deterioration in his chronic dyspnea today, blood gas shows extraordinarily good oxygenation although this does not entirely rule out the possibility of pulmonary embolism I am more suspicious of intermittent cardiac ischemia especially as his EKG has substantially changed from the tracing during his previous admission.  He has been fully anticoagulated and we will start looking  for sources of emboli with Dopplers of both lower extremities.    CARDIOVASCULAR A: He has had a significant change in his EKG since December with a new right bundle branch block.  There is also lateral ST depression and inferior T wave inversions.  Initial  enzymes are negative. I will be treating him as if he has underlying ischemia with a combination of aspirin and heparin pending further enzymes.  As noted thromboembolic disease is being ruled out first with a Doppler of the lower extremities and I am anticipating a subsequent VQ scan.  Echocardiogram is pending   RENAL A: He has an acute renal injury his creatinine on 1/11 2.0.  There is certainly a component of dehydration related to his ostomy output and I am correcting this.  GASTROINTESTINAL A: Prophylaxis will be with Protonix  INFECTIOUS A: He has a substantial leukocytosis and lactic acidosis.  Chest x-ray does not suggest an infiltrate.  He is suffered recently from a perforated viscus and I am rescanning his abdomen.  A urinalysis is pending.  In the  interim I will be covering him with meropenem.  Serial lactates urine output and pro-calcitonin's will be followed to judge response to therapy.   ENDOCRINE A: He is glucose intolerant and sliding scale insulin has been ordered.  Greater than 35 minutes was spent in the care of this patient today  Penny Pia, MD Pulmonary and Critical Care Medicine [facility] Pager: [phone]  09/10/2017, 2:38 PM

## 2017-09-10 NOTE — ED Provider Notes (Signed)
[facility] EMERGENCY [facility] Provider Note   CSN: [id number] Arrival date & time: 09/10/17  1121     History   Chief Complaint No chief complaint on file.   HPI Caleb Singh is a 62 y.o. male.  HPI Patient is a 62 year old male who presents to the emergency department from a rehab facility for several days of shortness of breath with severe worsening of his shortness of breath and development of chest pain after working with physical therapy today.  He presents the emergency department via EMS.  He has tachypneic and EMS reports hypoxia into the 70s on their arrival.  Patient reports some ongoing anterior chest pain and severe shortness of breath.  He feels as though the oxygen is helping.  He has a prolonged history of tobacco abuse but no history of asthma or COPD that he knows of.  He had a prolonged hospital stay through the month of December and was discharged approximately 10 days ago after perforated viscus and intra-abdominal infection resulting in abdominal surgery, open abdomen, colostomy.  Currently receiving wet-to-dry dressing changes to his abdomen in which the fascia is closed.  Patient does report a history of pulmonary embolism in the 1980s.  He is no longer on anticoagulation.  He denies unilateral leg swelling.  No prior history of MI.  Symptoms are moderate to severe in severity.  No fevers or chills.  Reports some cough of the past several days.   Past Medical History:  Diagnosis Date  . Bowel obstruction (HCC)   . Pneumothorax     Patient Active Problem List   Diagnosis Date Noted  . Protein-calorie malnutrition, severe 08/28/2017  . Abdominopelvic abscess (HCC)   . Bradycardia   . Postoperative intra-abdominal abscess   . Acute encephalopathy   . DNR (do not resuscitate) discussion   . Palliative care by specialist   . Weakness generalized   . Septic shock (HCC)   . Pressure injury of skin 08/07/2017  . Tachypnea   . Acute  delirium   . Intestinal perforation s/p SB resection/ileostomy 07/24/2017 & 07/25/2017 07/27/2017  . Ileostomy in place [facility](HCC) 07/27/2017  . Acute respiratory failure (HCC)   . AKI (acute kidney injury) (HCC)   . Free intraperitoneal air 07/23/2017  . Abscess of third finger of right hand 04/17/2016    Past Surgical History:  Procedure Laterality Date  . I&D EXTREMITY Right 04/17/2016   Procedure: IRRIGATION AND DEBRIDEMENT RIGHT HAND;  Surgeon: Dominica SeverinWilliam Gramig, MD;  [facility];  Service: Orthopedics;  Laterality: Right;  . I&D EXTREMITY Right 04/19/2016   Procedure: REPEAT I&D RIGHT HAND;  Surgeon: Dominica SeverinWilliam Gramig, MD;  Location: [facility] OR;  Service: Orthopedics;  Laterality: Right;  . LAPAROTOMY N/A 07/23/2017   Procedure: EXPLORATORY LAPAROTOMY, SMALL BOWEL RESECTION;  Surgeon: Gaynelle AduWilson, Eric, MD;  Location: [facility] ORS;  Service: General;  Laterality: N/A;  . LAPAROTOMY N/A 07/25/2017   Procedure: EXPLORATORY LAPAROSCOPY WITH ILEOCECTOMY, END ILEOSTOMY;  Surgeon: Almond LintByerly, Faera, MD;  Location: [facility] ORS;  Service: General;  Laterality: N/A;  PATIENT ABDOMINAL WOUND LEFT OPEN AND PACKED WITH BULKY DRESSING  . LAPAROTOMY N/A 07/31/2017   Procedure: EXPLORATORY LAPAROTOMY drainage of abdominal abcess;  Surgeon: Glenna FellowsHoxworth, Benjamin, MD;  Location: [facility] ORS;  Service: General;  Laterality: N/A;  . LEFT HEART CATH AND CORONARY ANGIOGRAPHY N/A 08/13/2017   Procedure: LEFT HEART CATH AND CORONARY ANGIOGRAPHY;  Surgeon: Dolores PattyBensimhon, Daniel R, MD;  Location: [facility] INVASIVE CV LAB;  Service: Cardiovascular;  Laterality: N/A;  Home Medications    Prior to Admission medications   Medication Sig Start Date End Date Taking? Authorizing Provider  amLODipine (NORVASC) 5 MG tablet Take 1 tablet (5 mg total) by mouth daily. 09/01/17   Darlin Drop, DO  feeding supplement, ENSURE ENLIVE, (ENSURE ENLIVE) LIQD Take 237 mLs by mouth 3 (three) times daily between meals. 08/31/17   Darlin Drop, DO  hydrALAZINE  (APRESOLINE) 25 MG tablet Take 1 tablet (25 mg total) by mouth every 8 (eight) hours. 08/31/17   Darlin Drop, DO  loperamide (IMODIUM) 2 MG capsule Take 1 capsule (2 mg total) by mouth every 8 (eight) hours. 08/31/17   Darlin Drop, DO  psyllium (HYDROCIL/METAMUCIL) 95 % PACK Take 1 packet by mouth daily. 09/01/17   Darlin Drop, DO    Family History No family history on file.  Social History Social History   Tobacco Use  . Smoking status: Former Games developer  . Smokeless tobacco: Never Used  Substance Use Topics  . Alcohol use: No  . Drug use: No     Allergies   Patient has no known allergies.   Review of Systems Review of Systems  All other systems reviewed and are negative.    Physical Exam Updated Vital Signs There were no vitals taken for this visit.  Physical Exam  Constitutional: He is oriented to person, place, and time. He appears well-developed and well-nourished.  HENT:  Head: Normocephalic and atraumatic.  Eyes: EOM are normal.  Neck: Normal range of motion.  Cardiovascular: Intact distal pulses.  Tachycardia.  Regular rhythm.  Faint peripheral pulses..  Pulmonary/Chest: Breath sounds normal. Accessory muscle usage present. No stridor. Tachypnea noted. No respiratory distress.  Abdominal: Soft. He exhibits no distension. There is no tenderness.  Musculoskeletal: Normal range of motion.  Neurological: He is alert and oriented to person, place, and time.  Skin: Skin is warm and dry.  Psychiatric: He has a normal mood and affect. Judgment normal.  Nursing note and vitals reviewed.    ED Treatments / Results  Labs (all labs ordered are listed, but only abnormal results are displayed) Labs Reviewed  CBC - Abnormal; Notable for the following components:      Result Value   WBC 22.8 (*)    RBC 3.59 (*)    Hemoglobin 10.4 (*)    HCT 32.1 (*)    Platelets 475 (*)    All other components within normal limits  COMPREHENSIVE METABOLIC PANEL - Abnormal;  Notable for the following components:   Sodium 123 (*)    Chloride 92 (*)    CO2 12 (*)    Glucose, Bld 221 (*)    BUN 46 (*)    Creatinine, Ser 2.76 (*)    Total Protein 8.8 (*)    Albumin 2.7 (*)    Alkaline Phosphatase 193 (*)    GFR calc non Af Amer 23 (*)    GFR calc Af Amer 27 (*)    Anion gap 19 (*)    All other components within normal limits  TROPONIN I - Abnormal; Notable for the following components:   Troponin I 0.09 (*)    All other components within normal limits  BRAIN NATRIURETIC PEPTIDE - Abnormal; Notable for the following components:   B Natriuretic Peptide 189.9 (*)    All other components within normal limits  URINALYSIS, ROUTINE W REFLEX MICROSCOPIC - Abnormal; Notable for the following components:   Color, Urine AMBER (*)  APPearance CLOUDY (*)    Glucose, UA 50 (*)    Protein, ur 100 (*)    Bacteria, UA FEW (*)    Squamous Epithelial / LPF 0-5 (*)    All other components within normal limits                                          I-STAT CG4 LACTIC ACID, ED - Abnormal; Notable for the following components:   Lactic Acid, Venous 7.79 (*)    All other components within normal limits  I-STAT ARTERIAL BLOOD GAS, ED - Abnormal; Notable for the following components:   pH, Arterial 7.288 (*)    pO2, Arterial 580.0 (*)    Bicarbonate 16.0 (*)    TCO2 17 (*)    Acid-base deficit 10.0 (*)    All other components within normal limits                                                                                                   EKG  EKG Interpretation  Date/Time:  Tuesday September 10 2017 11:23:46 EST Ventricular Rate:  111 PR Interval:    QRS Duration: 144 QT Interval:  305 QTC Calculation: 415 R Axis:   81 Text Interpretation:  Sinus tachycardia Right bundle branch block ST depr, consider ischemia, inferior leads non specific st and T wave changes Confirmed by Azalia Bilis ([id number]) on 09/10/2017 1:13:41  PM       Radiology Dg Chest Portable 1 View  Result Date: 09/10/2017 CLINICAL DATA:  Sudden onset shortness of breath, low O2 sats EXAM: PORTABLE CHEST 1 VIEW COMPARISON:  08/29/2017 FINDINGS: Heart and mediastinal contours are within normal limits. No focal opacities or effusions. No acute bony abnormality. IMPRESSION: No active disease. Electronically Signed   By: Charlett Nose M.D.   On: 09/10/2017 11:35    Procedures .Critical Care Performed by: Azalia Bilis, MD Authorized by: Azalia Bilis, MD    Total critical care time: 50 minutes Critical care time was exclusive of separately billable procedures and treating other patients. Critical care was necessary to treat or prevent imminent or life-threatening deterioration. Critical care was time spent personally by me on the following activities: development of treatment plan with patient and/or surrogate as well as nursing, discussions with consultants, evaluation of patient's response to treatment, examination of patient, obtaining history from patient or surrogate, ordering and performing treatments and interventions, ordering and review of laboratory studies, ordering and review of radiographic studies, pulse oximetry and re-evaluation of patient's condition.   Medications Ordered in ED Heparin per pharmacy NS bolus x 2   Initial Impression / Assessment and Plan / ED Course  I have reviewed the triage vital signs and the nursing notes.  Pertinent labs & imaging results that were available during my care of the patient were reviewed by me and considered in my medical decision making (see chart for details).     Patient initiated on BiPAP on arrival for accessory muscle use  and tachypnea.  Severe hypoxia and respiratory failure.  Found to be in acute renal failure as well.  Given his history of pulmonary embolism before in the past as well as his recent prolonged hospitalization and mobility significant concern for pulmonary  embolism.  Unable to obtain CT angiogram and too unstable to get a VQ scan and thus I ordered bilateral venous duplexes of his lower extremities as well as a stat bedside echocardiogram.  Patient was initiated on heparin empirically.  Abdomen with fascia closed shows no secondary signs of infection.  He has no significant abdominal tenderness on examination.  Doubt abdominal sepsis or intra-abdominal pathology.  High concern for PE with right heart strain.  Case was discussed with pulmonary critical care who came and saw and evaluated the patient.  I was called to bedside by the echo cardiographer and  noted large clot in his right atrium as well as his right ventricle which appeared to be mobile although somewhat attached to his valve.  He has significant right heart strain on bedside echo.  He is a candidate for IV lytic therapy and/or catheter directed lytics.  I have discussed this with the pulmonary critical care team who is considering this as a possibility but would like to evaluate his abdomen first with a CT abdomen before initiation of lytic therapy.  Patient and family updated.  Patient in critical condition.  I suspect large clot burden already within his lungs given his degree of right heart strain. I am concerned that if he were to dislodge the clot in his right atrium right ventricle to his pulmonary artery he could likely have sudden arrest.  Management per critical care. Admit to ICU.  Final Clinical Impressions(s) / ED Diagnoses   Final diagnoses:  Acute respiratory failure with hypoxia (HCC)  Tachycardia  Hypotension, unspecified hypotension type  Other acute pulmonary embolism with acute cor pulmonale (HCC)  Acute thrombus of right ventricle [facility])    ED Discharge Orders    None       Azalia Bilis, MD 09/10/17 2303

## 2017-09-10 NOTE — ED Triage Notes (Signed)
[facility]  With c/o chest pain and sob, ems states patient was sat @3L  93% then he drolpped his sats to low 70's was placed on NRBsats increased to 84% while enroute sats dropped again in the 70's Patient was being assisted with BVM upon arrival. Patient is alert oriented. States he had abd. Surgery with colostomy 12/4. States he feels like he overdone it in PT yest.

## 2017-09-10 NOTE — Procedures (Signed)
Central Venous Catheter Insertion Procedure Note Caleb Singh [id number] 10/28/1955  Procedure: Insertion of Central Venous Catheter Indications: Assessment of intravascular volume, Drug and/or fluid administration and Frequent blood sampling,   Procedure Details Consent: Unable to obtain consent because of emergent medical necessity. Time Out: Verified patient identification, verified procedure, site/side was marked, verified correct patient position, special equipment/implants available, medications/allergies/relevent history reviewed, required imaging and test results available.  Performed  Maximum sterile technique was used including antiseptics, cap, gloves, gown, hand hygiene, mask and sheet. Skin prep: Chlorhexidine; local anesthetic administered 3 ml.  A antimicrobial bonded/coated triple lumen catheter was placed in the left femoral vein due to emergent situation using the Seldinger technique.  Sutured, bio-patch placed, and sterile dressing applied.    Evaluation Blood flow good Complications: No apparent complications, no apparent hematoma. Patient did tolerate procedure well. Chest X-ray ordered to verify placement.  CXR: not indicated .  Procedure performed with ultrasound guidance for real time vessel cannulation due to high risk of insertion.  Caleb BoyerBrooke Clova Singh, AGACNP-BC [facility] Pgr: [phone] or if no answer [phone] 09/10/2017, 11:48 PM

## 2017-09-10 NOTE — ED Notes (Signed)
Sat monitor placed on forehead finger and ear having diffculty getting to pick up. Patient is able to speak in complete sentences, denies pain states he was given 324 mg ASA per ems and his chest pain is gone.

## 2017-09-10 NOTE — Progress Notes (Signed)
ABG obtained. Results given to MD. Fio2 decreased to 50% d/t SaO2 being 100%

## 2017-09-10 NOTE — ED Notes (Signed)
Patient resting on stretcher at present. We have tried mulitple times and areas to get pulse sat reading however we cant get an accurate reading. MD aware.

## 2017-09-11 ENCOUNTER — Inpatient Hospital Stay (HOSPITAL_COMMUNITY): Payer: Non-veteran care

## 2017-09-11 DIAGNOSIS — J9601 Acute respiratory failure with hypoxia: Secondary | ICD-10-CM

## 2017-09-11 DIAGNOSIS — M7989 Other specified soft tissue disorders: Secondary | ICD-10-CM

## 2017-09-11 LAB — ECHOCARDIOGRAM COMPLETE
E/e' ratio: 7.31
EWDT: 4 ms
FS: 37 % (ref 28–44)
HEIGHTINCHES: 70 in
IV/PV OW: 0.85
LADIAMINDEX: 1.69 cm/m2
LASIZE: 31 mm
LEFT ATRIUM END SYS DIAM: 31 mm
LV PW d: 13 mm — AB (ref 0.6–1.1)
LV TDI E'MEDIAL: 5.66
LVEEAVG: 7.31
LVEEMED: 7.31
LVELAT: 7.62 cm/s
LVOT area: 3.14 cm2
LVOT diameter: 20 mm
Lateral S' vel: 9.9 cm/s
MV Dec: 4
MV pk E vel: 55.7 m/s
MVAP: 3.49 cm2
MVPKAVEL: 45 m/s
MVSPHT: 63 ms
RV TAPSE: 15.5 mm
Reg peak vel: 208 cm/s
TDI e' lateral: 7.62
TR max vel: 208 cm/s
WEIGHTICAEL: 2398.6 [oz_av]

## 2017-09-11 LAB — POCT I-STAT 3, ART BLOOD GAS (G3+)
Acid-base deficit: 15 mmol/L — ABNORMAL HIGH (ref 0.0–2.0)
Acid-base deficit: 8 mmol/L — ABNORMAL HIGH (ref 0.0–2.0)
Bicarbonate: 15 mmol/L — ABNORMAL LOW (ref 20.0–28.0)
Bicarbonate: 18.8 mmol/L — ABNORMAL LOW (ref 20.0–28.0)
O2 Saturation: 100 %
O2 Saturation: 100 %
PCO2 ART: 50.3 mmHg — AB (ref 32.0–48.0)
Patient temperature: 93.5
TCO2: 17 mmol/L — ABNORMAL LOW (ref 22–32)
TCO2: 20 mmol/L — AB (ref 22–32)
pCO2 arterial: 40.8 mmHg (ref 32.0–48.0)
pH, Arterial: 7.064 — CL (ref 7.350–7.450)
pH, Arterial: 7.259 — ABNORMAL LOW (ref 7.350–7.450)
pO2, Arterial: 507 mmHg — ABNORMAL HIGH (ref 83.0–108.0)
pO2, Arterial: 328 mmHg — ABNORMAL HIGH (ref 83.0–108.0)

## 2017-09-11 LAB — CBC
HCT: 23.5 % — ABNORMAL LOW (ref 39.0–52.0)
HCT: 21.6 % — ABNORMAL LOW (ref 39.0–52.0)
HEMATOCRIT: 26.4 % — AB (ref 39.0–52.0)
Hemoglobin: 8.8 g/dL — ABNORMAL LOW (ref 13.0–17.0)
Hemoglobin: 8 g/dL — ABNORMAL LOW (ref 13.0–17.0)
Hemoglobin: 7.4 g/dL — ABNORMAL LOW (ref 13.0–17.0)
MCH: 28.8 pg (ref 26.0–34.0)
MCH: 28.6 pg (ref 26.0–34.0)
MCH: 28.9 pg (ref 26.0–34.0)
MCHC: 33.3 g/dL (ref 30.0–36.0)
MCHC: 34 g/dL (ref 30.0–36.0)
MCHC: 34.3 g/dL (ref 30.0–36.0)
MCV: 86.3 fL (ref 78.0–100.0)
MCV: 83.9 fL (ref 78.0–100.0)
MCV: 84.4 fL (ref 78.0–100.0)
PLATELETS: 184 10*3/uL (ref 150–400)
Platelets: 237 10*3/uL (ref 150–400)
Platelets: 200 10*3/uL (ref 150–400)
RBC: 3.06 MIL/uL — AB (ref 4.22–5.81)
RBC: 2.8 MIL/uL — ABNORMAL LOW (ref 4.22–5.81)
RBC: 2.56 MIL/uL — AB (ref 4.22–5.81)
RDW: 14.6 % (ref 11.5–15.5)
RDW: 14.4 % (ref 11.5–15.5)
RDW: 14.5 % (ref 11.5–15.5)
WBC: 22.1 10*3/uL — AB (ref 4.0–10.5)
WBC: 20.4 10*3/uL — ABNORMAL HIGH (ref 4.0–10.5)
WBC: 15.9 10*3/uL — AB (ref 4.0–10.5)

## 2017-09-11 LAB — COMPREHENSIVE METABOLIC PANEL
ALBUMIN: 1.1 g/dL — AB (ref 3.5–5.0)
ALK PHOS: 109 U/L (ref 38–126)
ALT: 1425 U/L — AB (ref 17–63)
ALT: 158 U/L — AB (ref 17–63)
ANION GAP: 29 — AB (ref 5–15)
AST: 195 U/L — ABNORMAL HIGH (ref 15–41)
AST: 1631 U/L — ABNORMAL HIGH (ref 15–41)
Albumin: 1.5 g/dL — ABNORMAL LOW (ref 3.5–5.0)
Alkaline Phosphatase: 165 U/L — ABNORMAL HIGH (ref 38–126)
Anion gap: 25 — ABNORMAL HIGH (ref 5–15)
BILIRUBIN TOTAL: 1.5 mg/dL — AB (ref 0.3–1.2)
BUN: 42 mg/dL — ABNORMAL HIGH (ref 6–20)
BUN: 47 mg/dL — ABNORMAL HIGH (ref 6–20)
CALCIUM: 6.7 mg/dL — AB (ref 8.9–10.3)
CHLORIDE: 94 mmol/L — AB (ref 101–111)
CO2: 17 mmol/L — ABNORMAL LOW (ref 22–32)
CO2: 12 mmol/L — AB (ref 22–32)
Calcium: 6.8 mg/dL — ABNORMAL LOW (ref 8.9–10.3)
Chloride: 95 mmol/L — ABNORMAL LOW (ref 101–111)
Creatinine, Ser: 2.99 mg/dL — ABNORMAL HIGH (ref 0.61–1.24)
Creatinine, Ser: 3.3 mg/dL — ABNORMAL HIGH (ref 0.61–1.24)
GFR calc Af Amer: 24 mL/min — ABNORMAL LOW (ref >60)
GFR calc Af Amer: 21 mL/min — ABNORMAL LOW (ref >60)
GFR calc non Af Amer: 21 mL/min — ABNORMAL LOW (ref >60)
GFR, EST NON AFRICAN AMERICAN: 18 mL/min — AB (ref >60)
Glucose, Bld: 439 mg/dL — ABNORMAL HIGH (ref 65–99)
Glucose, Bld: 251 mg/dL — ABNORMAL HIGH (ref 65–99)
Potassium: 4.7 mmol/L (ref 3.5–5.1)
Potassium: 4 mmol/L (ref 3.5–5.1)
SODIUM: 136 mmol/L (ref 135–145)
Sodium: 136 mmol/L (ref 135–145)
Total Bilirubin: 0.8 mg/dL (ref 0.3–1.2)
Total Protein: 4 g/dL — ABNORMAL LOW (ref 6.5–8.1)
Total Protein: 5.3 g/dL — ABNORMAL LOW (ref 6.5–8.1)

## 2017-09-11 LAB — MAGNESIUM
MAGNESIUM: 1.6 mg/dL — AB (ref 1.7–2.4)
Magnesium: 2 mg/dL (ref 1.7–2.4)

## 2017-09-11 LAB — CBC WITH DIFFERENTIAL/PLATELET
BASOS ABS: 0 10*3/uL (ref 0.0–0.1)
Basophils Relative: 0 %
Eosinophils Absolute: 0 10*3/uL (ref 0.0–0.7)
Eosinophils Relative: 0 %
HEMATOCRIT: 16 % — AB (ref 39.0–52.0)
HEMOGLOBIN: 5.1 g/dL — AB (ref 13.0–17.0)
LYMPHS ABS: 2.8 10*3/uL (ref 0.7–4.0)
LYMPHS PCT: 25 %
MCH: 28.8 pg (ref 26.0–34.0)
MCHC: 31.9 g/dL (ref 30.0–36.0)
MCV: 90.4 fL (ref 78.0–100.0)
MONOS PCT: 3 %
Monocytes Absolute: 0.3 10*3/uL (ref 0.1–1.0)
Neutro Abs: 8.2 10*3/uL — ABNORMAL HIGH (ref 1.7–7.7)
Neutrophils Relative %: 72 %
Platelets: 197 10*3/uL (ref 150–400)
RBC: 1.77 MIL/uL — AB (ref 4.22–5.81)
RDW: 15.6 % — AB (ref 11.5–15.5)
WBC: 11.3 10*3/uL — AB (ref 4.0–10.5)

## 2017-09-11 LAB — PHOSPHORUS
Phosphorus: 9.9 mg/dL — ABNORMAL HIGH (ref 2.5–4.6)
Phosphorus: 8.1 mg/dL — ABNORMAL HIGH (ref 2.5–4.6)

## 2017-09-11 LAB — HEPARIN LEVEL (UNFRACTIONATED)
Heparin Unfractionated: <0.1 IU/mL — ABNORMAL LOW (ref 0.30–0.70)
Heparin Unfractionated: <0.1 IU/mL — ABNORMAL LOW (ref 0.30–0.70)

## 2017-09-11 LAB — GLUCOSE, CAPILLARY
GLUCOSE-CAPILLARY: 175 mg/dL — AB (ref 65–99)
GLUCOSE-CAPILLARY: 122 mg/dL — AB (ref 65–99)
Glucose-Capillary: 234 mg/dL — ABNORMAL HIGH (ref 65–99)
Glucose-Capillary: 151 mg/dL — ABNORMAL HIGH (ref 65–99)
Glucose-Capillary: 73 mg/dL (ref 65–99)
Glucose-Capillary: 105 mg/dL — ABNORMAL HIGH (ref 65–99)

## 2017-09-11 LAB — LACTIC ACID, PLASMA
LACTIC ACID, VENOUS: 4.7 mmol/L — AB (ref 0.5–1.9)
Lactic Acid, Venous: 8.5 mmol/L (ref 0.5–1.9)

## 2017-09-11 LAB — TROPONIN I
TROPONIN I: 2.77 ng/mL — AB (ref <0.03)
TROPONIN I: 5.18 ng/mL — AB (ref <0.03)
TROPONIN I: 4.42 ng/mL — AB (ref <0.03)
Troponin I: 1.14 ng/mL (ref <0.03)
Troponin I: 4.5 ng/mL (ref <0.03)

## 2017-09-11 LAB — APTT
APTT: 197 s — AB (ref 24–36)
aPTT: 118 seconds — ABNORMAL HIGH (ref 24–36)
aPTT: 56 seconds — ABNORMAL HIGH (ref 24–36)

## 2017-09-11 LAB — PROTIME-INR
INR: 4.02
Prothrombin Time: 38.8 seconds — ABNORMAL HIGH (ref 11.4–15.2)

## 2017-09-11 LAB — HIV ANTIBODY (ROUTINE TESTING W REFLEX): HIV Screen 4th Generation wRfx: NONREACTIVE

## 2017-09-11 LAB — PROCALCITONIN: PROCALCITONIN: 10.56 ng/mL

## 2017-09-11 LAB — FIBRINOGEN: Fibrinogen: 73 mg/dL — CL (ref 210–475)

## 2017-09-11 LAB — ABO/RH: ABO/RH(D): O POS

## 2017-09-11 LAB — PREPARE RBC (CROSSMATCH)

## 2017-09-11 MED ORDER — ALBUTEROL SULFATE (2.5 MG/3ML) 0.083% IN NEBU
2.5000 mg | INHALATION_SOLUTION | RESPIRATORY_TRACT | Status: DC | PRN
Start: 2017-09-11 — End: 2017-09-21

## 2017-09-11 MED ORDER — NOREPINEPHRINE BITARTRATE 1 MG/ML IV SOLN
0.0000 ug/min | INTRAVENOUS | Status: DC
  Administered 2017-09-11: 60 ug/min via INTRAVENOUS
  Administered 2017-09-11: 40 ug/min via INTRAVENOUS
  Administered 2017-09-12: 4 ug/min via INTRAVENOUS
  Filled 2017-09-11 (×2): qty 16

## 2017-09-11 MED ORDER — HEPARIN (PORCINE) IN NACL 100-0.45 UNIT/ML-% IJ SOLN
2250.0000 [IU]/h | INTRAMUSCULAR | Status: DC
  Administered 2017-09-11: 1100 [IU]/h via INTRAVENOUS
  Administered 2017-09-12: 1450 [IU]/h via INTRAVENOUS
  Administered 2017-09-13 (×2): 1700 [IU]/h via INTRAVENOUS
  Administered 2017-09-14: 1800 [IU]/h via INTRAVENOUS
  Administered 2017-09-15: 2000 [IU]/h via INTRAVENOUS
  Administered 2017-09-15: 1950 [IU]/h via INTRAVENOUS
  Administered 2017-09-16: 2000 [IU]/h via INTRAVENOUS
  Administered 2017-09-17 – 2017-09-19 (×4): 2150 [IU]/h via INTRAVENOUS
  Administered 2017-09-20 – 2017-09-21 (×2): 2250 [IU]/h via INTRAVENOUS
  Filled 2017-09-11 (×20): qty 250

## 2017-09-11 MED ORDER — SODIUM CHLORIDE 0.9% FLUSH: 10.0000 mL | INTRAVENOUS | Status: DC | PRN

## 2017-09-11 MED ORDER — PERFLUTREN LIPID MICROSPHERE
1.0000 mL | INTRAVENOUS | Status: AC | PRN
  Administered 2017-09-11: 3 mL via INTRAVENOUS
  Filled 2017-09-11: qty 10

## 2017-09-11 MED ORDER — MAGNESIUM SULFATE 2 GM/50ML IV SOLN
2.0000 g | Freq: Once | INTRAVENOUS | Status: AC
  Administered 2017-09-11: 2 g via INTRAVENOUS
  Filled 2017-09-11: qty 50

## 2017-09-11 MED ORDER — SODIUM CHLORIDE 0.9% FLUSH
10.0000 mL | Freq: Two times a day (BID) | INTRAVENOUS | Status: DC
  Administered 2017-09-11 – 2017-09-12 (×2): 10 mL

## 2017-09-11 MED ORDER — INSULIN ASPART 100 UNIT/ML ~~LOC~~ SOLN
2.0000 [IU] | SUBCUTANEOUS | Status: DC
  Administered 2017-09-11: 4 [IU] via SUBCUTANEOUS
  Administered 2017-09-11: 2 [IU] via SUBCUTANEOUS
  Administered 2017-09-11: 6 [IU] via SUBCUTANEOUS
  Administered 2017-09-12 – 2017-09-16 (×2): 2 [IU] via SUBCUTANEOUS

## 2017-09-11 MED ORDER — CHLORHEXIDINE GLUCONATE CLOTH 2 % EX PADS
6.0000 | MEDICATED_PAD | Freq: Every day | CUTANEOUS | Status: DC
  Administered 2017-09-12 (×2): 6 via TOPICAL

## 2017-09-11 MED ORDER — PH 12 STERILE DILUENT: 2.0000 ng/kg/min | INTRAVENOUS | Status: DC

## 2017-09-11 MED ORDER — SODIUM CHLORIDE 0.9 % IV SOLN: Freq: Once | INTRAVENOUS | Status: DC

## 2017-09-11 MED ORDER — PHENYLEPHRINE HCL 10 MG/ML IJ SOLN
0.0000 ug/min | INTRAMUSCULAR | Status: DC
  Administered 2017-09-11: 140 ug/min via INTRAVENOUS
  Filled 2017-09-11: qty 40

## 2017-09-11 MED FILL — Medication: Qty: 1 | Status: AC

## 2017-09-11 NOTE — Progress Notes (Signed)
Consulted CT Surgery (Dr Dorris FetchHendrickson) who says they do not do [facility] at [facility].   Pharmacy says that we DO have inhaled flolan which they are willing to give us for this patient. However RT says the equipment for the inhaled flolan [facility]. Since there would be considerable delay getting that equipment moved over, will instead start inhaled nitric oxide.

## 2017-09-11 NOTE — Progress Notes (Signed)
   09/11/17 0000  Clinical Encounter Type  Visited With Family;Health care provider;Patient  Visit Type Code  Referral From Nurse  Consult/Referral To Chaplain  Spiritual Encounters  Spiritual Needs Emotional;Prayer  Stress Factors  Family Stress Factors Major life changes   Responded to a code blue for this patient.  Upon arriving medical team was in place.  Obtained family contact and called patient's sister to come back.  She arrived and I met her and connected physician.  Daughter of the patient also came.  Escorted them to the conference room to wait.  Allowed space for them to share about the patient and get to know them.  Provided hospitality.  Waiting for nurse to give the ok to bring them back.  Will follow as needed. Chaplain Katherene Ponto

## 2017-09-11 NOTE — Progress Notes (Signed)
RR page to Nuc Med, RN felicia at bedside bagging pt on my arrival, pt initially lethargic, diaphoretic, skin mottled. RN reports pt went unresponsive as he was moved from his bed to nuc med table. Pt came around with in a few minutes, MAE, alert and oriented x4. Pt requesting to stay in Nuc Med to complete scan because he was "just tired and weak from lack of sleep." States he "feels fine." pt transported back up to 2M06 MD made aware. MD made aware by RN Sunny SchleinFelicia. Pt again became unresponsive and brady to 20's. Code blue called at 2152

## 2017-09-11 NOTE — Progress Notes (Signed)
Patient is hypothermic. Have asked RN to place warming blanket. Labs show hyperglycemia; have increased the SSI from sensitive to standard scale.

## 2017-09-11 NOTE — Progress Notes (Signed)
CRITICAL VALUE ALERT  Critical Value:  Hgb 5.1  Date & Time Notied:  09/11/2017 @ 01:20  Provider Notified: Sherrie MustacheSimpson, P. NP  Orders Received/Actions taken: Orders to follow

## 2017-09-11 NOTE — Progress Notes (Signed)
*  Preliminary Results* Bilateral lower extremity venous duplex completed. Bilateral lower extremities are positive for acute deep vein thrombosis involving bilateral posterior tibial and peroneal veins. There is no evidence of Baker's cyst bilaterally.  Preliminary results discussed with Fleet ContrasRachel, RN.  09/11/2017 2:32 PM Gertie FeyMichelle Shajuana Mclucas, BS, RVT, RDCS, RDMS

## 2017-09-11 NOTE — Progress Notes (Signed)
   09/11/17 1100  Clinical Encounter Type  Visited With Patient and family together  Visit Type Initial  Referral From Nurse  Consult/Referral To Chaplain  Spiritual Encounters  Spiritual Needs Prayer  Stress Factors  Patient Stress Factors Exhausted  Family Stress Factors None identified;Exhausted    Patient was in his bed intubated. Daughter was present and tearful. Sister to patient immediately arrived. Charge nurse and another on-site helping pt. Who seemingly was very agitated. Charge nurse was able to explain to family members about all that was going on medically with patient. Family very appreciative and hopeful about patient's condition as compared to how he was previously. I provided reflective listening and compassionate presence.  Perian Tedder a Water quality scientistMusiko-Holley, E. I. du PontChaplain

## 2017-09-11 NOTE — Progress Notes (Signed)
PULMONARY / CRITICAL CARE MEDICINE   Name: Caleb Singh MRN: [id number] DOB: 06-Apr-1956    ADMISSION DATE:  09/10/2017 CONSULTATION DATE:  1/22  REFERRING MD:  EDP  CHIEF COMPLAINT:  Dyspnea   HISTORY OF PRESENT ILLNESS:   62 year old male smoker (40 pack years) with hx PE, who suffered from a perforated small bowel on December 24 for which he underwent small bowel resection and subsequently suffered from multisystem failure and required CRRT. Course was c/b E Coli bacteremia, respiratory failure, brief arrest, and intra-abdominal abscess which was drained percutaneously.  He was discharged 1/12 to rehab where he has been having difficulties with chronic dyspnea.  He returned 1/22 with worsening dyspnea and DOE with chest tightness.   He was admitted for respiratory failure and was undergoing w/u to r/o PE. He was in VQ scan when he had worsening dyspnea despite bipap, had rapid decline and ultimately PEA arrest x 2.  He received systemic TPA and TTE suspicious for RA and IVC clot.  He had ongoing profound shock, coagulopathy and significant anemia.  Made DNR in the event of repeat arrest but continued aggressive support.    SUBJECTIVE:  Overnight events reviewed. Pressor demands slightly improved. Neo off.  Getting repeat TTE. Bleeding from multiple sites.  Nitric remains.   VITAL SIGNS: BP (!) 141/75   Pulse (!) 102   Temp 99.3 F (37.4 C) (Oral)   Resp (!) 26   Ht 5\' 10"  (1.778 m)   Wt 68 kg (149 lb 14.6 oz)   SpO2 99%   BMI 21.51 kg/m   HEMODYNAMICS:    VENTILATOR SETTINGS: Vent Mode: PRVC FiO2 (%):  [60 %-100 %] 60 % Set Rate:  [22 bmp-26 bmp] 26 bmp Vt Set:  [580 mL] 580 mL PEEP:  [8 cmH20-20 cmH20] 8 cmH20 Plateau Pressure:  [18 cmH20-28 cmH20] 18 cmH20  INTAKE / OUTPUT: I/O last 3 completed shifts: In: 3504.6 [P.O.:200; I.V.:3274.6; Blood:30] Out: 1130 [Urine:30; Emesis/NG output:100; Stool:1000]  PHYSICAL EXAMINATION: General:  Chronically ill appearing  male, critically ill  Neuro:  RASS -2, opens eyes to voice per RN, fentanyl increased this am r/t vent dyssynchrony  HEENT:  Mm moist, ETT, bleeding from nose, bloody secretions in ETT  Cardiovascular:  s1s2 rrr Lungs:  resps even non labored on vent, coarse  Abdomen:  Round, soft, bloody ostomy outpt, epigastric wound (?previous perc drain site) bleeding Musculoskeletal:  Warm and dry, no sig edema  LABS:  BMET Recent Labs  Lab 09/10/17 1123 09/11/17 0042 09/11/17 0541  NA 123* 136 136  K 5.0 4.7 4.0  CL 92* 95* 94*  CO2 12* 12* 17*  BUN 46* 42* 47*  CREATININE 2.76* 2.99* 3.30*  GLUCOSE 221* 439* 251*    Electrolytes Recent Labs  Lab 09/10/17 1123 09/11/17 0042 09/11/17 0541  CALCIUM 9.2 6.7* 6.8*  MG  --  2.0 1.6*  PHOS  --  9.9* 8.1*    CBC Recent Labs  Lab 09/10/17 1123 09/11/17 0042 09/11/17 0820  WBC 22.8* 11.3* 22.1*  HGB 10.4* 5.1* 8.8*  HCT 32.1* 16.0* 26.4*  PLT 475* 197 237    Coag's Recent Labs  Lab 09/11/17 0042 09/11/17 0541  APTT 197* 118*  INR 4.02*  --     Sepsis Markers Recent Labs  Lab 09/10/17 1349 09/10/17 1443 09/10/17 2205 09/11/17 0541 09/11/17 0820  LATICACIDVEN 3.59*  --  10.5*  --  8.5*  PROCALCITON  --  2.33  --  10.56  --  ABG Recent Labs  Lab 09/10/17 2353 09/11/17 0246 09/11/17 0508  PHART 7.296* 7.064* 7.259*  PCO2ART 26.7* 50.3* 40.8  PO2ART 337.0* 507.0* 328.0*    Liver Enzymes Recent Labs  Lab 09/10/17 1123 09/11/17 0042 09/11/17 0541  AST 29 195* 1,631*  ALT 20 158* 1,425*  ALKPHOS 193* 109 165*  BILITOT 0.8 0.8 1.5*  ALBUMIN 2.7* 1.1* 1.5*    Cardiac Enzymes Recent Labs  Lab 09/10/17 2205 09/11/17 0042 09/11/17 0541  TROPONINI 1.33* 1.14* 2.77*    Glucose Recent Labs  Lab 09/10/17 2149 09/10/17 2357 09/11/17 0552 09/11/17 0751  GLUCAP 175* 351* 234* 151*    Imaging Portable Chest Xray  Result Date: 09/11/2017 CLINICAL DATA:  Check endotracheal tube intubation  EXAM: PORTABLE CHEST 1 VIEW COMPARISON:  09/10/2017 FINDINGS: Endotracheal tube and gastric catheter are noted in satisfactory position. Cardiac shadow is stable. The lungs are well aerated bilaterally with tiny pleural effusions bilaterally. No focal infiltrate is noted. No bony abnormality is seen. IMPRESSION: Gastric catheter now in satisfactory position. The remainder of the exam is relatively stable. Electronically Signed   By: Alcide Clever M.D.   On: 09/11/2017 07:04   Portable Chest X-ray  Result Date: 09/10/2017 CLINICAL DATA:  Endotracheal tube placement.  Post cardiac arrest. EXAM: PORTABLE CHEST 1 VIEW COMPARISON:  09/10/2017 FINDINGS: Extrinsic artifacts are present. Endotracheal tube has been placed with tip measuring 6.4 cm above the carina. Shallow inspiration. Heart size and pulmonary vascularity are normal. Lungs are clear. No pneumothorax. No pleural effusions. Degenerative changes in the spine. IMPRESSION: Endotracheal tube tip measures 6.4 cm above the carina. Lungs are clear. Electronically Signed   By: Burman Nieves M.D.   On: 09/10/2017 22:57   Dg Chest Portable 1 View  Result Date: 09/10/2017 CLINICAL DATA:  Sudden onset shortness of breath, low O2 sats EXAM: PORTABLE CHEST 1 VIEW COMPARISON:  08/29/2017 FINDINGS: Heart and mediastinal contours are within normal limits. No focal opacities or effusions. No acute bony abnormality. IMPRESSION: No active disease. Electronically Signed   By: Charlett Nose M.D.   On: 09/10/2017 11:35   Dg Abd Portable 1v  Result Date: 09/11/2017 CLINICAL DATA:  OG tube placement. EXAM: PORTABLE ABDOMEN - 1 VIEW COMPARISON:  CT abdomen and pelvis 08/22/2017.  Abdomen 08/20/2017 FINDINGS: Enteric tube tip is in the left upper quadrant consistent with location in the upper stomach. Visualized bowel loops are not distended. IMPRESSION: Enteric tube tip projects over the upper stomach. Electronically Signed   By: Burman Nieves M.D.   On: 09/11/2017  00:14     STUDIES:  Echo 1/22>>>EF 65-70%, grade 1 diastolic dysfunction, large RA clot measuring 4.63cm x 1cm which is protruding through the TV and is consistent with very large thrombus. The atrium was moderately dilated. Severe RV dysfunction, suspicion of clot in IVC Echo 1/23>>>  CULTURES: BC x 2 1/22>>>  Urine 1/22>>>   ANTIBIOTICS: Meropenem 1/22>>>   SIGNIFICANT EVENTS: 1/22>>> PEA arrest, TPA  LINES/TUBES: ETT 1/22>>>  L fem CVL 1/22>>>  L fem Aline 1/22>>>    DISCUSSION: 62yo male with shock, coagulopathy, profound anemia after PEA arrest 1/22 likely r/t PE, RA/IVC thrombus s/p systemic thrombolytics.   ASSESSMENT / PLAN:  PULMONARY Acute hypoxic respiratory failure  Suspect PE  Suspect underlying COPD  P:   Vent support - 8cc/kg  F/u CXR  F/u ABG Increase RR 22 No weaning for now  VQ scan was not completed due to arrest  Continue inhaled nitric  PRN  BD  Sedation as below for vent synchrony   CARDIOVASCULAR Shock - cardiogenic  RA/IVC thrombus  P:  Weaning pressors as able for MAP >65 - levophed, vasopressin. Neo currently off  F/u echo being done now - will f/u results  BLE venous dopplers pending  S/p systemic thrombolytics  Hold home norvasc F/u lactate  Trend troponin - still trending up  Holding off on heparin for now given ongoing bleeding, significant anemia but will need to resume at some point   RENAL AKI on CKD -- baseline Scr ~1.9-2.1 Metabolic acidosis  P:   F/u chem  Continue gentle volume  Continue HCO3 gtt  Trend lactate - remains elevated but trending down   GASTROINTESTINAL transaminitis - ?shock liver v additional thromboembolism  Recent perforated small bowel 12/24 with intra-abd abscess  P:   NPO  Monitor ostomy bleeding  F/u LFTs  PPI  CT abd/pelvis pending for when more stable to transport    HEMATOLOGIC Coagulopathy s/p systemic thrombolytics - multiple bleeding sites  Blood loss anemia  P:  S/p 2 units  PRBC this am - f/u cbc now and q6h  Heparin gtt on hold for now r/t bleeding   INFECTIOUS Recent septic shock - EColi bacteremia, intra-abd sepsis  Elevated pct  Leukocytosis  P:   Empiric meropenem per pharmacy  Trend lactate, pct  Pan cultures pending   ENDOCRINE Hyperglycemia   P:   SSI   NEUROLOGIC AMS  Sedation needs on vent  Post arrest -- opening eyes to voice per RN P:   RASS goal: -2 Fentanyl gtt for vent synchrony Consider CT head when travels for CT abd to r/o ICH given significant bleeding/coagulopathy post TPA   FAMILY  - Updates:   No family available 1/23, updated at length overnight by Dr. Merlene Pulling.   - Inter-disciplinary family meet or Palliative Care meeting due by:  Day 7    Dirk Dress, NP 09/11/2017  9:41 AM Pager: [phone] or [phone]

## 2017-09-11 NOTE — Progress Notes (Signed)
ANTICOAGULATION CONSULT NOTE  Pharmacy Consult for heparin Indication: pulmonary embolus  No Known Allergies  Patient Measurements: Height: 5\' 10"  (177.8 cm) Weight: 149 lb 14.6 oz (68 kg) IBW/kg (Calculated) : 73 Heparin Dosing Weight: 68 kg  Vital Signs: Temp: 98.7 F (37.1 C) (01/23 1124) Temp Source: Oral (01/23 1124) BP: 95/61 (01/23 1200) Pulse Rate: 102 (01/23 1200)  Labs: Recent Labs    09/10/17 1123  09/11/17 0042 09/11/17 0536 09/11/17 0541 09/11/17 0820 09/11/17 0949 09/11/17 1209  HGB 10.4*  --  5.1*  --   --  8.8*  --   --   HCT 32.1*  --  16.0*  --   --  26.4*  --   --   PLT 475*  --  197  --   --  237  --   --   APTT  --   --  197*  --  118*  --   --  56*  LABPROT  --   --  38.8*  --   --   --   --   --   INR  --   --  4.02*  --   --   --   --   --   HEPARINUNFRC  --   --  <0.10* <0.10*  --   --   --   --   CREATININE 2.76*  --  2.99*  --  3.30*  --   --   --   TROPONINI 0.09*   < > 1.14*  --  2.77*  --  4.50*  --    < > = values in this interval not displayed.    Estimated Creatinine Clearance: 22 mL/min (A) (by C-G formula based on SCr of 3.3 mg/dL (H)).  Assessment: CC/HPI: CP, SOB  PMH: perforated SB 12/24 resulting in small bowel resection then MSOF requiring CRRT, developed intra-abd abscess  Anticoag: Heparin for r/o PE - now s/p 2 PEA arrests with TNKase 1/22, now aPTT < 80 Bleeding from mouth rectum and surgical site - monitor while on heparin  CV: bp ok on 40 NE, 10 Neo, Vaso  GI/Nutrition: colostomy, LBM 1/23 Shock liver 1631/1425  Renal: SCr 3.3, Mg 1.6  Heme/Onc: H&H 5.1/16, Plt 197 INR 4.02  Goal of Therapy:  Heparin level 0.3-0.7 units/ml Monitor platelets by anticoagulation protocol: Yes   Plan:  Ok to resume hep per Dr. Craige CottaSood - no bolus Heparin 1100 units/hr Initial lvl 2000 Daily HL CBC Monitor for increased bleeding   Isaac BlissMichael Sai Zinn, PharmD, BCPS, BCCCP Clinical Pharmacist Clinical phone for 09/11/2017 from  7a-3:30p: [phone] If after 3:30p, please call main pharmacy at: [phone] 09/11/2017 1:24 PM

## 2017-09-11 NOTE — Progress Notes (Signed)
I had a meeting with patient's sister (who is his POA), his daughter, and his son-in-law. Also present during the meeting were the chaplain and ICU RN Felicia. Explained patient's current medical status to the family as well as his unfortunately very grim prognosis. Patient's sister (POA) decided to make patient DNR but wants to continue current aggressive ICU care outside of CPR or Defibrillation. Will change code status order to DNR.

## 2017-09-11 NOTE — Progress Notes (Signed)
CRITICAL VALUE ALERT  Critical Value: Fibrinogen 73  Date & Time Notied:  09/11/17 0840  Provider Notified: CCMD  Orders Received/Actions taken: monitor

## 2017-09-11 NOTE — Progress Notes (Signed)
  Echocardiogram 2D Echocardiogram has been performed.  [name] G Breven Guidroz 09/11/2017, 9:55 AM

## 2017-09-11 NOTE — Clinical Social Work Note (Signed)
Clinical Social Work Assessment  Patient Details  Name: Caleb Singh MRN: [id number] Date of Birth: [birth year]  Date of referral:  09/11/17               Reason for Lamar Laundryconsult:  Facility Placement                Permission sought to share information with:  Family Supports Permission granted to share information::  Yes, Verbal Permission Granted  Name::     Gorden Harmsesiree Davis   Agency::  family  Relationship::  sister  Contact Information:  Gorden HarmsDesiree Davis  Housing/Transportation Living arrangements for the past 2 months:  Skilled Holiday representativeursing Facility, Single Family Home([name], home alone. ) Source of Information:  Siblings(pt's sister. ) Patient Interpreter Needed:  None Criminal Activity/Legal Involvement Pertinent to Current Situation/Hospitalization:  No - Comment as needed Significant Relationships:  Adult Children, Siblings Lives with:  Self, Facility Resident Do you feel safe going back to the place where you live?  Yes Need for family participation in patient care:  Yes (Comment)  Care giving concerns:  CSW spoke with pt's sister Desiree via phone as pt is intubated and unable to speak with CSW. During this time pt's sister did no express any concerns to CSW as she appeared to be understanding of plans for care at this time.    Social Worker assessment / plan: CSW spoke with pt's sister via phone. CSW was informed that [facility] [place] [facility] [place] been [place] [facility] [place]. Per pt's sister [facility] and family that pt would be leaving. Pt's sister expressed that pt ha support [place] and herself. Pt has other siblings and children, however none live locally to assist with pt's needs at this time. Pt's sister is agreeable to additional SNF placement but suggested that pt may need long term placement after this hospital stay.   Employment status:  Other  (Comment)(unknown at this time. ) Insurance information:  [employer] Benefit PT Recommendations:  Not assessed at this time Information / Referral to community resources:     Patient/Family's Response to care:  Pt's sister appeared to be understanding an agreeable to plan of care. Pt's sister is open to treatment options for pt at this time.   Patient/Family's Understanding of and Emotional Response to Diagnosis, Current Treatment, and Prognosis:  No further questions or concerns have been presented to CSW at this time. CSW remains available for support and other needs of pt and family at this time.   Emotional Assessment Appearance:  Appears stated age Attitude/Demeanor/Rapport:  Unable to Assess Affect (typically observed):  Unable to Assess Orientation:  (unable to assess as pt is intubated at this time. ) Alcohol / Substance use:  Not Applicable Psych involvement (Current and /or in the community):  No (Comment)  Discharge Needs  Concerns to be addressed:  No discharge needs identified Readmission within the last 30 days:  No Current discharge risk:  None Barriers to Discharge:  Continued Medical Work up   Sempra EnergyKierra S Tamirra Sienkiewicz, LCSWA 09/11/2017, 10:36 AM

## 2017-09-11 NOTE — Progress Notes (Signed)
ANTICOAGULATION CONSULT NOTE  Pharmacy Consult for heparin Indication: pulmonary embolus  No Known Allergies  Patient Measurements: Height: 5\' 10"  (177.8 cm) Weight: 149 lb 14.6 oz (68 kg) IBW/kg (Calculated) : 73 Heparin Dosing Weight: 68 kg  Vital Signs: Temp: 99.5 F (37.5 C) (01/23 1945) Temp Source: Oral (01/23 1945) BP: 107/55 (01/23 1934) Pulse Rate: 97 (01/23 1934)  Labs: Recent Labs    09/10/17 1123  09/11/17 0042 09/11/17 0536 09/11/17 0541 09/11/17 0820 09/11/17 0949 09/11/17 1209 09/11/17 1439 09/11/17 1944  HGB 10.4*  --  5.1*  --   --  8.8*  --   --  8.0*  --   HCT 32.1*  --  16.0*  --   --  26.4*  --   --  23.5*  --   PLT 475*  --  197  --   --  237  --   --  200  --   APTT  --   --  197*  --  118*  --   --  56*  --   --   LABPROT  --   --  38.8*  --   --   --   --   --   --   --   INR  --   --  4.02*  --   --   --   --   --   --   --   HEPARINUNFRC  --   --  <0.10* <0.10*  --   --   --   --   --  <0.10*  CREATININE 2.76*  --  2.99*  --  3.30*  --   --   --   --   --   TROPONINI 0.09*   < > 1.14*  --  2.77*  --  4.50*  --  5.18*  --    < > = values in this interval not displayed.    Estimated Creatinine Clearance: 22 mL/min (A) (by C-G formula based on SCr of 3.3 mg/dL (H)).  Assessment: [name] on Heparin for r/o PE - now s/p 2 [facility]. Restarted heparin this afternoon without bolus. Heparin level undetectable on 1100 units/hr. No issue with lines, no interruption with infusion, no bleeding noted per RN. Noted Bilateral lower extremities are positive for acute deep vein thrombosis involving bilateral posterior tibial and peroneal veins.    Goal of Therapy:  Heparin level 0.3-0.7 units/ml Monitor platelets by anticoagulation protocol: Yes   Plan:  Increase heparin to 1250 units/hr Recheck heparin level in 8 hrs at 0500 Daily HL CBC Monitor for increased bleeding   Bayard HuggerMei Crue Otero, PharmD, BCPS  Clinical Pharmacist  Pager:  [phone]   09/11/2017 8:25 PM

## 2017-09-11 NOTE — Progress Notes (Signed)
CRITICAL VALUE ALERT  Critical Value:  Lactic acid 8.5  Date & Time Notied:  0930  09/11/17  Provider Notified: Jasper RilingWhiteheart, K., NP  Orders Received/Actions taken: Monitor

## 2017-09-12 ENCOUNTER — Inpatient Hospital Stay (HOSPITAL_COMMUNITY): Payer: Non-veteran care

## 2017-09-12 DIAGNOSIS — R748 Abnormal levels of other serum enzymes: Secondary | ICD-10-CM

## 2017-09-12 DIAGNOSIS — I2699 Other pulmonary embolism without acute cor pulmonale: Secondary | ICD-10-CM

## 2017-09-12 DIAGNOSIS — I469 Cardiac arrest, cause unspecified: Secondary | ICD-10-CM

## 2017-09-12 DIAGNOSIS — G9340 Encephalopathy, unspecified: Secondary | ICD-10-CM

## 2017-09-12 LAB — BASIC METABOLIC PANEL
ANION GAP: 17 — AB (ref 5–15)
Anion gap: 17 — ABNORMAL HIGH (ref 5–15)
BUN: 54 mg/dL — ABNORMAL HIGH (ref 6–20)
BUN: 47 mg/dL — ABNORMAL HIGH (ref 6–20)
CALCIUM: 6.4 mg/dL — AB (ref 8.9–10.3)
CALCIUM: 6.4 mg/dL — AB (ref 8.9–10.3)
CO2: 29 mmol/L (ref 22–32)
CO2: 31 mmol/L (ref 22–32)
CREATININE: 3.71 mg/dL — AB (ref 0.61–1.24)
CREATININE: 4 mg/dL — AB (ref 0.61–1.24)
Chloride: 91 mmol/L — ABNORMAL LOW (ref 101–111)
Chloride: 91 mmol/L — ABNORMAL LOW (ref 101–111)
GFR calc non Af Amer: 16 mL/min — ABNORMAL LOW (ref >60)
GFR, EST AFRICAN AMERICAN: 19 mL/min — AB (ref >60)
GFR, EST AFRICAN AMERICAN: 17 mL/min — AB (ref >60)
GFR, EST NON AFRICAN AMERICAN: 15 mL/min — AB (ref >60)
Glucose, Bld: 120 mg/dL — ABNORMAL HIGH (ref 65–99)
Glucose, Bld: 106 mg/dL — ABNORMAL HIGH (ref 65–99)
Potassium: 3.7 mmol/L (ref 3.5–5.1)
Potassium: 3.8 mmol/L (ref 3.5–5.1)
SODIUM: 137 mmol/L (ref 135–145)
SODIUM: 139 mmol/L (ref 135–145)

## 2017-09-12 LAB — POCT I-STAT 7, (LYTES, BLD GAS, ICA,H+H)
ACID-BASE EXCESS: 12 mmol/L — AB (ref 0.0–2.0)
BICARBONATE: 36.6 mmol/L — AB (ref 20.0–28.0)
CALCIUM ION: 0.9 mmol/L — AB (ref 1.15–1.40)
HEMATOCRIT: 26 % — AB (ref 39.0–52.0)
Hemoglobin: 8.8 g/dL — ABNORMAL LOW (ref 13.0–17.0)
O2 SAT: 98 %
PO2 ART: 105 mmHg (ref 83.0–108.0)
Patient temperature: 97.9
Potassium: 4 mmol/L (ref 3.5–5.1)
Sodium: 137 mmol/L (ref 135–145)
TCO2: 38 mmol/L — ABNORMAL HIGH (ref 22–32)
pCO2 arterial: 48.3 mmHg — ABNORMAL HIGH (ref 32.0–48.0)
pH, Arterial: 7.486 — ABNORMAL HIGH (ref 7.350–7.450)

## 2017-09-12 LAB — HEPATIC FUNCTION PANEL
ALBUMIN: 1.5 g/dL — AB (ref 3.5–5.0)
ALT: 2507 U/L — ABNORMAL HIGH (ref 17–63)
AST: 4320 U/L — ABNORMAL HIGH (ref 15–41)
Alkaline Phosphatase: 170 U/L — ABNORMAL HIGH (ref 38–126)
BILIRUBIN DIRECT: 0.4 mg/dL (ref 0.1–0.5)
BILIRUBIN TOTAL: 1.2 mg/dL (ref 0.3–1.2)
Indirect Bilirubin: 0.8 mg/dL (ref 0.3–0.9)
Total Protein: 4.7 g/dL — ABNORMAL LOW (ref 6.5–8.1)

## 2017-09-12 LAB — HEPARIN LEVEL (UNFRACTIONATED)
HEPARIN UNFRACTIONATED: 0.32 [IU]/mL (ref 0.30–0.70)
Heparin Unfractionated: <0.1 IU/mL — ABNORMAL LOW (ref 0.30–0.70)
Heparin Unfractionated: 0.17 IU/mL — ABNORMAL LOW (ref 0.30–0.70)

## 2017-09-12 LAB — CBC
HCT: 23.4 % — ABNORMAL LOW (ref 39.0–52.0)
HCT: 23.9 % — ABNORMAL LOW (ref 39.0–52.0)
HEMATOCRIT: 19.8 % — AB (ref 39.0–52.0)
HEMOGLOBIN: 8 g/dL — AB (ref 13.0–17.0)
Hemoglobin: 6.7 g/dL — CL (ref 13.0–17.0)
Hemoglobin: 8.1 g/dL — ABNORMAL LOW (ref 13.0–17.0)
MCH: 29 pg (ref 26.0–34.0)
MCH: 29.7 pg (ref 26.0–34.0)
MCH: 29.2 pg (ref 26.0–34.0)
MCHC: 33.8 g/dL (ref 30.0–36.0)
MCHC: 34.2 g/dL (ref 30.0–36.0)
MCHC: 33.9 g/dL (ref 30.0–36.0)
MCV: 85.7 fL (ref 78.0–100.0)
MCV: 87 fL (ref 78.0–100.0)
MCV: 86.3 fL (ref 78.0–100.0)
PLATELETS: 158 10*3/uL (ref 150–400)
Platelets: 183 10*3/uL (ref 150–400)
Platelets: 156 10*3/uL (ref 150–400)
RBC: 2.31 MIL/uL — ABNORMAL LOW (ref 4.22–5.81)
RBC: 2.69 MIL/uL — ABNORMAL LOW (ref 4.22–5.81)
RBC: 2.77 MIL/uL — ABNORMAL LOW (ref 4.22–5.81)
RDW: 15 % (ref 11.5–15.5)
RDW: 14.8 % (ref 11.5–15.5)
RDW: 14.9 % (ref 11.5–15.5)
WBC: 14.5 10*3/uL — ABNORMAL HIGH (ref 4.0–10.5)
WBC: 14.4 10*3/uL — ABNORMAL HIGH (ref 4.0–10.5)
WBC: 14.7 10*3/uL — AB (ref 4.0–10.5)

## 2017-09-12 LAB — GLUCOSE, CAPILLARY
GLUCOSE-CAPILLARY: 131 mg/dL — AB (ref 65–99)
GLUCOSE-CAPILLARY: 115 mg/dL — AB (ref 65–99)
Glucose-Capillary: 114 mg/dL — ABNORMAL HIGH (ref 65–99)
Glucose-Capillary: 117 mg/dL — ABNORMAL HIGH (ref 65–99)
Glucose-Capillary: 94 mg/dL (ref 65–99)

## 2017-09-12 LAB — MRSA PCR SCREENING: MRSA by PCR: NEGATIVE

## 2017-09-12 LAB — TROPONIN I
TROPONIN I: 1.82 ng/mL — AB (ref <0.03)
TROPONIN I: 1.08 ng/mL — AB (ref <0.03)
Troponin I: 1.62 ng/mL (ref <0.03)

## 2017-09-12 LAB — PROCALCITONIN: PROCALCITONIN: 50.13 ng/mL

## 2017-09-12 LAB — MAGNESIUM: MAGNESIUM: 1.7 mg/dL (ref 1.7–2.4)

## 2017-09-12 LAB — PREPARE RBC (CROSSMATCH)

## 2017-09-12 LAB — PHOSPHORUS: PHOSPHORUS: 5.4 mg/dL — AB (ref 2.5–4.6)

## 2017-09-12 LAB — LACTIC ACID, PLASMA: Lactic Acid, Venous: 1.9 mmol/L (ref 0.5–1.9)

## 2017-09-12 MED ORDER — SODIUM CHLORIDE 0.9 % IV SOLN: Freq: Once | INTRAVENOUS | Status: DC

## 2017-09-12 MED ORDER — SODIUM BICARBONATE 650 MG PO TABS
650.0000 mg | ORAL_TABLET | Freq: Four times a day (QID) | ORAL | Status: DC
  Administered 2017-09-12: 650 mg via NASOGASTRIC
  Filled 2017-09-12 (×2): qty 1

## 2017-09-12 MED ORDER — ORAL CARE MOUTH RINSE
15.0000 mL | Freq: Two times a day (BID) | OROMUCOSAL | Status: DC
  Administered 2017-09-12 – 2017-09-21 (×19): 15 mL via OROMUCOSAL

## 2017-09-12 MED ORDER — ONDANSETRON HCL 4 MG/2ML IJ SOLN
4.0000 mg | Freq: Four times a day (QID) | INTRAMUSCULAR | Status: DC | PRN
  Administered 2017-09-12: 4 mg via INTRAVENOUS
  Filled 2017-09-12: qty 2

## 2017-09-12 MED ORDER — WHITE PETROLATUM EX OINT
TOPICAL_OINTMENT | CUTANEOUS | Status: AC
  Administered 2017-09-13: 02:00:00
  Filled 2017-09-12: qty 28.35

## 2017-09-12 MED ORDER — POTASSIUM CHLORIDE 20 MEQ/15ML (10%) PO SOLN
20.0000 meq | Freq: Once | ORAL | Status: AC
  Administered 2017-09-12: 20 meq
  Filled 2017-09-12: qty 15

## 2017-09-12 NOTE — Progress Notes (Signed)
ANTICOAGULATION CONSULT NOTE  Pharmacy Consult for heparin Indication: pulmonary embolus  No Known Allergies  Patient Measurements: Height: 5\' 10"  (177.8 cm) Weight: 173 lb 4.5 oz (78.6 kg) IBW/kg (Calculated) : 73 Heparin Dosing Weight: 68 kg  Vital Signs: Temp: 98.7 F (37.1 C) (01/24 0355) Temp Source: Oral (01/24 0355) BP: 122/73 (01/24 0347) Pulse Rate: 85 (01/24 0347)  Labs: Recent Labs    09/11/17 0042 09/11/17 0536 09/11/17 0541 09/11/17 0820 09/11/17 0949 09/11/17 1209 09/11/17 1439 09/11/17 1944 09/11/17 2154 09/11/17 2155 09/12/17 0404  HGB 5.1*  --   --  8.8*  --   --  8.0*  --  7.4*  --   --   HCT 16.0*  --   --  26.4*  --   --  23.5*  --  21.6*  --   --   PLT 197  --   --  237  --   --  200  --  184  --   --   APTT 197*  --  118*  --   --  56*  --   --   --   --   --   LABPROT 38.8*  --   --   --   --   --   --   --   --   --   --   INR 4.02*  --   --   --   --   --   --   --   --   --   --   HEPARINUNFRC <0.10* <0.10*  --   --   --   --   --  <0.10*  --   --  <0.10*  CREATININE 2.99*  --  3.30*  --   --   --   --   --   --   --  3.71*  TROPONINI 1.14*  --  2.77*  --  4.50*  --  5.18*  --   --  4.42*  --     Estimated Creatinine Clearance: 21 mL/min (A) (by C-G formula based on SCr of 3.71 mg/dL (H)).  Assessment: [name] on Heparin for r/o PE - now s/p 2 [facility].  Noted Bilateral lower extremities are positive for acute deep vein thrombosis involving bilateral posterior tibial and peroneal veins.   Heparin level is still undetectable. Small drop in hgb. RN reports heparin has been infusing appropriately.   Goal of Therapy:  Heparin level 0.3-0.7 units/ml Monitor platelets by anticoagulation protocol: Yes   Plan:  Increase heparin to 1450 units/hr Check level this afternoon Daily HL CBC   Baldemar FridayMasters, Nihaal Friesen M  09/12/2017 4:49 AM

## 2017-09-12 NOTE — Progress Notes (Signed)
PULMONARY / CRITICAL CARE MEDICINE   Name: Caleb Singh MRN: [id number] DOB: [birth year]    ADMISSION DATE:  09/10/2017 CONSULTATION DATE:  1/22  REFERRING MD:  EDP  CHIEF COMPLAINT:  Dyspnea   HISTORY OF PRESENT ILLNESS:   62 year old male smoker (40 pack years) with hx PE, who suffered from a perforated small bowel on December 24 for which he underwent small bowel resection and subsequently suffered from multisystem failure and required CRRT. Course was c/b E Coli bacteremia, respiratory failure, brief arrest, and intra-abdominal abscess which was drained percutaneously.  He was discharged 1/12 to rehab where he has been having difficulties with chronic dyspnea.  He returned 1/22 with worsening dyspnea and DOE with chest tightness.   He was admitted for respiratory failure and was undergoing w/u to r/o PE. He was in VQ scan when he had worsening dyspnea despite bipap, had rapid decline and ultimately PEA arrest x 2.  He received systemic TPA and TTE suspicious for RA and IVC clot.  He had ongoing profound shock, coagulopathy and significant anemia.  Made DNR in the event of repeat arrest but continued aggressive support.   SUBJECTIVE:  Much improved this am. Off pressors but BP still a bit soft.  Awake.  Hgb drop overnight, getting PRBC.   VITAL SIGNS: BP 123/60   Pulse (!) 101   Temp 98 F (36.7 C) (Oral)   Resp 14   Ht 5\' 10"  (1.778 m)   Wt 78.6 kg (173 lb 4.5 oz)   SpO2 100%   BMI 24.86 kg/m   HEMODYNAMICS:    VENTILATOR SETTINGS: Vent Mode: PSV FiO2 (%):  [30 %-60 %] 40 % Set Rate:  [22 bmp-26 bmp] 22 bmp Vt Set:  [580 mL] 580 mL PEEP:  [8 cmH20] 8 cmH20 Pressure Support:  [8 cmH20] 8 cmH20 Plateau Pressure:  [18 cmH20-23 cmH20] 21 cmH20  INTAKE / OUTPUT: I/O last 3 completed shifts: In: 9612.3 [I.V.:8904.3; Blood:458; IV Piggyback:250] Out: 1600 [Urine:150; Emesis/NG output:100; Stool:1350]  PHYSICAL EXAMINATION: General:  Chronically ill appearing male,  critically ill  Neuro:  RASS 0, awake, follows commands, writing notes, remains on fentanyl  HEENT:  Mm moist, ETT  Cardiovascular:  s1s2 rrr Lungs:  resps even non labored on PS 8/5, poor Vt with 5/5 per RT, coarse  Abdomen:  Round, soft, bloody ostomy outpt, epigastric wound (?previous perc drain site) oozing Musculoskeletal:  Warm and dry, BUE edema   LABS:  BMET Recent Labs  Lab 09/11/17 0042 09/11/17 0541 09/12/17 0404  NA 136 136 137  K 4.7 4.0 3.7  CL 95* 94* 91*  CO2 12* 17* 29  BUN 42* 47* 54*  CREATININE 2.99* 3.30* 3.71*  GLUCOSE 439* 251* 120*   Electrolytes Recent Labs  Lab 09/11/17 0042 09/11/17 0541 09/12/17 0404  CALCIUM 6.7* 6.8* 6.4*  MG 2.0 1.6* 1.7  PHOS 9.9* 8.1* 5.4*   CBC Recent Labs  Lab 09/11/17 1439 09/11/17 2154 09/12/17 0404  WBC 20.4* 15.9* 14.5*  HGB 8.0* 7.4* 6.7*  HCT 23.5* 21.6* 19.8*  PLT 200 184 183   Coag's Recent Labs  Lab 09/11/17 0042 09/11/17 0541 09/11/17 1209  APTT 197* 118* 56*  INR 4.02*  --   --    Sepsis Markers Recent Labs  Lab 09/10/17 1443 09/10/17 2205 09/11/17 0541 09/11/17 0820 09/11/17 1439 09/12/17 0404  LATICACIDVEN  --  10.5*  --  8.5* 4.7*  --   PROCALCITON 2.33  --  10.56  --   --  50.13   ABG Recent Labs  Lab 09/10/17 2353 09/11/17 0246 09/11/17 0508  PHART 7.296* 7.064* 7.259*  PCO2ART 26.7* 50.3* 40.8  PO2ART 337.0* 507.0* 328.0*   Liver Enzymes Recent Labs  Lab 09/11/17 0042 09/11/17 0541 09/12/17 0404  AST 195* 1,631* 4,320*  ALT 158* 1,425* 2,507*  ALKPHOS 109 165* 170*  BILITOT 0.8 1.5* 1.2  ALBUMIN 1.1* 1.5* 1.5*   Cardiac Enzymes Recent Labs  Lab 09/11/17 0949 09/11/17 1439 09/11/17 2155  TROPONINI 4.50* 5.18* 4.42*   Glucose Recent Labs  Lab 09/11/17 0751 09/11/17 1121 09/11/17 1543 09/11/17 1942 09/12/17 0355 09/12/17 0741  GLUCAP 151* 73 105* 122* 131* 114*    Imaging Dg Chest Portable 1 View  Result Date: 09/12/2017 CLINICAL DATA:   Respiratory failure.  Ventilator support. EXAM: PORTABLE CHEST 1 VIEW COMPARISON:  09/11/2017 FINDINGS: Endotracheal tube tip is 5 cm above the carina. Nasogastric tube enters the stomach. Mild bibasilar infiltrate in volume loss right more than left with small amount of pleural fluid on the right. IMPRESSION: Endotracheal tube and nasogastric tube well positioned. Bibasilar volume loss/infiltrate right more than left. Electronically Signed   By: Paulina Fusi M.D.   On: 09/12/2017 06:50     STUDIES:  Echo 1/22>>>EF 65-70%, grade 1 diastolic dysfunction, large RA clot measuring 4.63cm x 1cm which is protruding through the TV and is consistent with very large thrombus. The atrium was moderately dilated. Severe RV dysfunction, suspicion of clot in IVC Echo 1/23>>> thrombus in RA no longer seen, EF 60-65%, severely dilated RV CT abd pelvis 1/24>>>  CULTURES: BC x 2 1/22>>>  Urine 1/22>>>   ANTIBIOTICS: Meropenem 1/22>>>   SIGNIFICANT EVENTS: 1/22>>> PEA arrest, TPA  LINES/TUBES: ETT 1/22>>>  L fem CVL 1/22>>>  L fem Aline 1/22>>>    DISCUSSION: 62yo male with shock, coagulopathy, profound anemia after PEA arrest 1/22 likely r/t PE, RA/IVC thrombus s/p systemic thrombolytics.   ASSESSMENT / PLAN:  PULMONARY Acute hypoxic respiratory failure  ?HCAP  Suspect PE  Suspect underlying COPD  P:   Vent support - 8cc/kg  F/u CXR  F/u ABG PS wean as tol  Weaning inhaled nitric  PRN BD  Empiric abx as below   CARDIOVASCULAR Shock - cardiogenic. Resolved.  RA/IVC thrombus  Suspect massive PE  Elevated troponin  P:  Pressors off  F/u echo did no longer visualized RA thrombus  BLE venous dopplers pending  S/p systemic thrombolytics 1/22 Hold home norvasc F/u lactate  Trend troponin - still trending up  Heparin gtt per pharmacy - monitor closely for bleeding  Will ask cardiology to see with troponin still trending up despite overall improved stability, shock  resolved  RENAL AKI on CKD -- baseline Scr ~1.9-2.1 Metabolic acidosis  Recent Labs  Lab 09/10/17 1123 09/11/17 0042 09/11/17 0541 09/12/17 0404  CREATININE 2.76* 2.99* 3.30* 3.71*   P:   F/u chem  Continue gentle volume  May need renal to see  D/c HCO3 gtt  Ensure lactate clearance   GASTROINTESTINAL transaminitis - ?shock liver v additional thromboembolism - worsening  Recent perforated small bowel 12/24 with intra-abd abscess  P:   NPO  Monitor ostomy bleeding  Trend LFTs  PPI  CT abd/pelvis pending  Consider surgery v GI input if ongoing bleeding - difficult as he definitely needs ongoing anticoagulation in setting suspected massive PE   HEMATOLOGIC Coagulopathy s/p systemic thrombolytics - overt bleeding improved.  Still bloody ostomy outpt Blood loss anemia  P:  2 units  PRBC this am  F/u CBC q6h  Heparin gtt as above with suspected massive PE   INFECTIOUS Recent septic shock - EColi bacteremia, intra-abd sepsis  Elevated pct  Leukocytosis  P:   Continue empiric meropenem per pharmacy  Trend lactate, pct  Pan cultures pending   ENDOCRINE Hyperglycemia   P:   SSI   NEUROLOGIC AMS - resolved.  Sedation needs on vent  Post arrest --  Mental status intact 1/24 P:   RASS goal: -1 Fentanyl gtt    FAMILY  - Updates:   No family available 1/24  - Inter-disciplinary family meet or Palliative Care meeting due by:  Day 7    Dirk DressKaty Whiteheart, NP 09/12/2017  9:12 AM Pager: [phone] or [phone]) [phone]  Attending Note:  [age] year male with PEA arrest after a massive PE.  Patient received lytics and is not improved.  VDRF.  On exam, lungs are clear.  I reviewed CXR myself, ETT is in good position.  Will continue weaning.  Proceed with extubation.  Titrate O2 for sat of 88-92%.  Monitor for airway protection and clearance.  Heparin for DVT/PE.  R/O intraabdominal infection via CT of the abdomen today.  Titrate levo to off as able.  PCCM will  follow.  The patient is critically ill with multiple organ systems failure and requires high complexity decision making for assessment and support, frequent evaluation and titration of therapies, application of advanced monitoring technologies and extensive interpretation of multiple databases.   Critical Care Time devoted to patient care services described in this note is  35  Minutes. This time reflects time of care of this signee Dr Koren BoundWesam Yacoub. This critical care time does not reflect procedure time, or teaching time or supervisory time of PA/NP/Med student/Med Resident etc but could involve care discussion time.  Alyson ReedyWesam G. Yacoub, M.D. [facility]. Pager: [phone]. After hours pager: [phone].

## 2017-09-12 NOTE — Procedures (Signed)
Extubation Procedure Note  Patient Details:   Name: Caleb Singh DOB: [birth year] MRN: [id number]   Airway Documentation:   Positive air leak prior to extubation.  Evaluation  O2 sats: stable throughout Complications: No apparent complications Patient did tolerate procedure well. Bilateral Breath Sounds: Clear, Diminished   Yes Pt able to speak. No stridor noted. Breath sounds equal and bilat. Pt remains on NO 12ppm. tol well no complications.  Kenton KingfisherLaura  Sheryll Dymek 09/12/2017, 12:16 PM

## 2017-09-12 NOTE — Consult Note (Signed)
Cardiology Consultation:   Patient ID: Caleb Singh; [id number]; [birth year]   Admit date: 09/10/2017 Date of Consult: 09/12/2017  Primary Care Provider: Patient, No Pcp Per Primary Cardiologist: Seen by Dr. Percival Spanish on previous admission  Patient Profile:   Caleb Singh is a 62 y.o. male with PMH of PE, tobacco abuse, and recent hospitalization for small bowel perforation from 34/02/4258-5/63/8756 complicated by multisystem failure requiring CRRT, E.Coli bacteremia, respiratory failure, brief arrest, bradycardia s/p temp pace wire (removed 08/26/17; thought to be vagally mediated), and intraabdominal abscess s/p percutaneous drainage, discharged to a SNF 08/31/17, however returned 09/10/17 with DOE and chest tightness, with subsequent respiratory failure from presumed massive PE, with PEA arrest x2 (1/22) who is being seen today for the evaluation of elevated troponin at the request of Dr. Pearline Cables.  History of Present Illness:   Caleb Singh  is a 62 y.o. male with PMH of PE, tobacco abuse, and recent hospitalization for small bowel perforation from 43/10/2949-8/84/1660 complicated by multisystem failure requiring CRRT, E.Coli bacteremia, respiratory failure, brief arrest, and intraabdominal abscess s/p percutaneous drainage. Cardiology consulted for bradycardia during that hospitalization with temp pace wire placement on 08/12/17. Bradycardia ultimately thought to be vagally medicated and temp wire removed 08/26/17. Patient was discharged to a SNF 08/31/17.   Patient returned 09/10/17 with DOE and chest tightness. Per H&P, patient had difficulties with chronic dyspnea while at SNF. Upon working with physical therapy 1/22, patient became acutely more dyspneic associated with some chest tightness. Suspicions were high for PE. While in radiology for V/Q scan patient had worsening dyspnea despite Bipap with rapid decline resulting in PEA arrest x2. Patient received systemic TPA. TTE 1/22 with evidence of RA  and IVC clot, repeat TTE 1/23 with thrombus in RA no longer seen. Troponin initially 0.09 on admission, peaked at 5.18 1/24, now 4.42. Patient continued to have ongoing profound shock, coagulopathy, and significant anemia. Patient extubated and was weaned off of pressors today.   Patient does not recall events surrounding this hospitalization. At this time he denies CP or SOB. He denies prior history of CP with exertion, DOE, orthopnea, PND, LE edema. Only complaints at this time are dry mouth, hunger, and feeling a little disoriented. He reports having high blood pressure, however was never started on medication, otherwise no prior cardiac history and never had an ischemic work-up. Risk factors include tobacco abuse. He denies family history of CAD, MI, and stroke.    Past Medical History:  Diagnosis Date  . Bowel obstruction ([place])   . Pneumothorax     Past Surgical History:  Procedure Laterality Date  . COLOSTOMY    . I&D EXTREMITY Right 04/17/2016   Procedure: IRRIGATION AND DEBRIDEMENT RIGHT HAND;  Surgeon: Roseanne Kaufman, MD;  Location: [place];  Service: Orthopedics;  Laterality: Right;  . I&D EXTREMITY Right 04/19/2016   Procedure: REPEAT I&D RIGHT HAND;  Surgeon: Roseanne Kaufman, MD;  Location: [place];  Service: Orthopedics;  Laterality: Right;  . LAPAROTOMY N/A 07/23/2017   Procedure: EXPLORATORY LAPAROTOMY, SMALL BOWEL RESECTION;  Surgeon: Greer Pickerel, MD;  Location: [facility];  Service: General;  Laterality: N/A;  . LAPAROTOMY N/A 07/25/2017   Procedure: EXPLORATORY LAPAROSCOPY WITH ILEOCECTOMY, END ILEOSTOMY;  Surgeon: [name], MD;  Location: [facility];  Service: General;  Laterality: N/A;  PATIENT ABDOMINAL WOUND LEFT OPEN AND PACKED WITH BULKY DRESSING  . LAPAROTOMY N/A 07/31/2017   Procedure: EXPLORATORY LAPAROTOMY drainage of abdominal abcess;  Surgeon: Excell Seltzer, MD;  Location: [facility];  Service: General;  Laterality: N/A;  . LEFT HEART CATH AND CORONARY ANGIOGRAPHY N/A  08/13/2017   Procedure: LEFT HEART CATH AND CORONARY ANGIOGRAPHY;  Surgeon: Jolaine Artist, MD;  Location: [place] [facility];  Service: Cardiovascular;  Laterality: N/A;     Home Medications:  Prior to Admission medications   Medication Sig Start Date End Date Taking? Authorizing Provider  albuterol (PROVENTIL HFA;VENTOLIN HFA) 108 (90 Base) MCG/ACT inhaler Inhale 2 puffs into the lungs every 6 (six) hours as needed for wheezing or shortness of breath.   Yes [provider]  amLODipine (NORVASC) 5 MG tablet Take 1 tablet (5 mg total) by mouth daily. 09/01/17  Yes Hall, Lorenda Cahill, DO  feeding supplement, ENSURE ENLIVE, (ENSURE ENLIVE) LIQD Take 237 mLs by mouth 3 (three) times daily between meals. 08/31/17  Yes Hall, Carole N, DO  fluticasone furoate-vilanterol (BREO ELLIPTA) 100-25 MCG/INH AEPB Inhale 1 puff into the lungs daily.   Yes [provider]  hydrALAZINE (APRESOLINE) 25 MG tablet Take 1 tablet (25 mg total) by mouth every 8 (eight) hours. 08/31/17  Yes Kayleen Memos, DO  loperamide (IMODIUM) 2 MG capsule Take 1 capsule (2 mg total) by mouth every 8 (eight) hours. 08/31/17  Yes Hall, Carole N, DO  psyllium (HYDROCIL/METAMUCIL) 95 % PACK Take 1 packet by mouth daily. 09/01/17  Yes Kayleen Memos, DO    Inpatient Medications: Scheduled Meds: . chlorhexidine gluconate (MEDLINE KIT)  15 mL Mouth Rinse BID  . Chlorhexidine Gluconate Cloth  6 each Topical Daily  . insulin aspart  2-6 Units Subcutaneous Q4H  . mouth rinse  15 mL Mouth Rinse QID  . pantoprazole (PROTONIX) IV  40 mg Intravenous QHS  . sodium chloride flush  10-40 mL Intracatheter Q12H   Continuous Infusions: . sodium chloride    . sodium chloride 100 mL/hr at 09/12/17 0929  . sodium chloride    . sodium chloride    . fentaNYL infusion INTRAVENOUS Stopped (09/12/17 1056)  . heparin 1,450 Units/hr (09/12/17 0930)  . meropenem (MERREM) IV Stopped (09/12/17 0346)  . norepinephrine (LEVOPHED) Adult  infusion Stopped (09/12/17 1058)   PRN Meds: sodium chloride, albuterol, docusate, fentaNYL (SUBLIMAZE) injection, midazolam, midazolam, sodium chloride flush  Allergies:   No Known Allergies  Social History:   Social History   Socioeconomic History  . Marital status: Single    Spouse name: Not on file  . Number of children: Not on file  . Years of education: Not on file  . Highest education level: Not on file  Social Needs  . Financial resource strain: Not on file  . Food insecurity - worry: Not on file  . Food insecurity - inability: Not on file  . Transportation needs - medical: Not on file  . Transportation needs - non-medical: Not on file  Occupational History  . Not on file  Tobacco Use  . Smoking status: Former Research scientist (life sciences)  . Smokeless tobacco: Never Used  Substance and Sexual Activity  . Alcohol use: Yes  . Drug use: No  . Sexual activity: Not on file  Other Topics Concern  . Not on file  Social History Narrative  . Not on file    Family History:   Father with HTN, DM, throat CA Mother with DM, pancreatic CA  ROS:  Please see the history of present illness.   All other ROS reviewed and negative.     Physical Exam/Data:   Vitals:   09/12/17 0845 09/12/17 0900 09/12/17  1000 09/12/17 1057  BP: 123/60 (!) 119/59 (!) 99/52 129/66  Pulse: (!) 102 (!) 101 98 97  Resp: _0 Temp: 98.4 F (36.9 C) 98 F (36.7 C)  98.2 F (36.8 C)  TempSrc: Oral Oral  Oral  SpO2: 100% 100% 100%   Weight:      Height:        Intake/Output Summary (Last 24 hours) at 09/12/2017 1114 Last data filed at 09/12/2017 1057 Gross per 24 hour  Intake 6071.42 ml  Output 1035 ml  Net 5036.42 ml   Filed Weights   09/10/17 1300 09/12/17 0405  Weight: 149 lb 14.6 oz (68 kg) 173 lb 4.5 oz (78.6 kg)   Body mass index is 24.86 kg/m.  General:  Thin appearing AA male laying in bed sleeping. Wakes easily.  HEENT: sclera anicteric, dry MM  Neck: no JVD Vascular: No carotid bruits;  distal pulses 2+ bilaterally Cardiac:  normal S1, S2; RRR; no murmurs, gallops, or rubs appreciated Lungs:  clear to auscultation bilaterally anteriorly, no wheezing, rhonchi or rales  Abd: quiet bowel sounds, +ostomy with brown liquid stool, nontender to light palpation; midline dressing C/D/I Ext: 1+ b/l LE and UE edema; L arm with dressing covering old IV site.  Musculoskeletal:  No deformities Skin: warm and dry  Neuro:  CNs 2-12 intact, no focal abnormalities noted; slow speech, slow movements Psych:  Normal affect   EKG:  The EKG was personally reviewed and demonstrates:  Sinus tachycardia with incomplete RBBB; TWI inferolateral leads Telemetry:  Telemetry was personally reviewed and demonstrates:  Sinus with occasional sinus tachycardia over the past 24hrs  Relevant CV Studies:  ECHO 09/10/17: Study Conclusions  - Left ventricle: The cavity size was normal. There was mild   concentric hypertrophy. Systolic function was vigorous. The   estimated ejection fraction was in the range of 65% to 70%. Wall   motion was normal; there were no regional wall motion   abnormalities. There was an increased relative contribution of   atrial contraction to ventricular filling. Doppler parameters are   consistent with abnormal left ventricular relaxation (grade 1   diastolic dysfunction). - Ventricular septum: The contour showed moderate diastolic   flattening and moderate systolic flattening. These changes are   consistent with RV volume and pressure overload. - Right ventricle: The cavity size was severely dilated. Wall   thickness was normal. Systolic function was severely reduced. - Right atrium: There is a large oblong mobile density within the   RA cavity measuring 4.63cm x 1cm which is protruding through the   TV and is consistent with very large thrombus. The atrium was   moderately dilated. - Pulmonary arteries: PA peak pressure: 32 mm Hg (S). - Impressions: Hyperdynamic LVF EF  65-70%. Severely dilated RV with   severe RV dysfunction, RA enlargement. There is a large elongated   mobile mass measuring 4.63cm x 1 cm in the RA that intermittently   protrudes through the TV c/w thrombus. There is also suspicion of   thrombus in the IVC.  Impressions:  - Hyperdynamic LVF EF 65-70%. Severely dilated RV with severe RV   dysfunction, RA enlargement. There is a large elongated mobile   mass measuring 4.63cm x 1 cm in the RA that intermittently   protrudes through the TV c/w thrombus. There is also suspicion of   thrombus in the IVC.  ECHO 09/11/17: Study Conclusions  - Left ventricle: The cavity size was normal. Wall thickness was  increased in a pattern of mild LVH. Systolic function was normal.   The estimated ejection fraction was in the range of 60% to 65%.   Wall motion was normal; there were no regional wall motion   abnormalities. - Ventricular septum: Septal motion showed &quot;bounce&quot;. - Aortic valve: Trileaflet; mildly thickened, mildly calcified   leaflets. - Right ventricle: The cavity size was severely dilated. Wall   thickness was normal. Systolic function was severely reduced. - Pulmonic valve: There was trivial regurgitation.  Impressions:  - Thrombus in RA no longer seen.  Laboratory Data:  Chemistry Recent Labs  Lab 09/11/17 0042 09/11/17 0541 09/12/17 0404  NA 136 136 137  K 4.7 4.0 3.7  CL 95* 94* 91*  CO2 12* 17* 29  GLUCOSE 439* 251* 120*  BUN 42* 47* 54*  CREATININE 2.99* 3.30* 3.71*  CALCIUM 6.7* 6.8* 6.4*  GFRNONAA 21* 18* 16*  GFRAA 24* 21* 19*  ANIONGAP 29* 25* 17*    Recent Labs  Lab 09/11/17 0042 09/11/17 0541 09/12/17 0404  PROT 4.0* 5.3* 4.7*  ALBUMIN 1.1* 1.5* 1.5*  AST 195* 1,631* 4,320*  ALT 158* 1,425* 2,507*  ALKPHOS 109 165* 170*  BILITOT 0.8 1.5* 1.2   Hematology Recent Labs  Lab 09/11/17 1439 09/11/17 2154 09/12/17 0404  WBC 20.4* 15.9* 14.5*  RBC 2.80* 2.56* 2.31*  HGB 8.0*  7.4* 6.7*  HCT 23.5* 21.6* 19.8*  MCV 83.9 84.4 85.7  MCH 28.6 28.9 29.0  MCHC 34.0 34.3 33.8  RDW 14.4 14.5 15.0  PLT 200 184 183   Cardiac Enzymes Recent Labs  Lab 09/10/17 2205 09/11/17 0042 09/11/17 0541 09/11/17 0949 09/11/17 1439 09/11/17 2155  TROPONINI 1.33* 1.14* 2.77* 4.50* 5.18* 4.42*   No results for input(s): TROPIPOC in the last 168 hours.  BNP Recent Labs  Lab 09/10/17 1124  BNP 189.9*    DDimer No results for input(s): DDIMER in the last 168 hours.  Radiology/Studies:  Dg Chest Portable 1 View  Result Date: 09/12/2017 CLINICAL DATA:  Respiratory failure.  Ventilator support. EXAM: PORTABLE CHEST 1 VIEW COMPARISON:  09/11/2017 FINDINGS: Endotracheal tube tip is 5 cm above the carina. Nasogastric tube enters the stomach. Mild bibasilar infiltrate in volume loss right more than left with small amount of pleural fluid on the right. IMPRESSION: Endotracheal tube and nasogastric tube well positioned. Bibasilar volume loss/infiltrate right more than left. Electronically Signed   By: Nelson Chimes M.D.   On: 09/12/2017 06:50   Portable Chest Xray  Result Date: 09/11/2017 CLINICAL DATA:  Check endotracheal tube intubation EXAM: PORTABLE CHEST 1 VIEW COMPARISON:  09/10/2017 FINDINGS: Endotracheal tube and gastric catheter are noted in satisfactory position. Cardiac shadow is stable. The lungs are well aerated bilaterally with tiny pleural effusions bilaterally. No focal infiltrate is noted. No bony abnormality is seen. IMPRESSION: Gastric catheter now in satisfactory position. The remainder of the exam is relatively stable. Electronically Signed   By: Inez Catalina M.D.   On: 09/11/2017 07:04   Portable Chest X-ray  Result Date: 09/10/2017 CLINICAL DATA:  Endotracheal tube placement.  Post cardiac arrest. EXAM: PORTABLE CHEST 1 VIEW COMPARISON:  09/10/2017 FINDINGS: Extrinsic artifacts are present. Endotracheal tube has been placed with tip measuring 6.4 cm above the carina.  Shallow inspiration. Heart size and pulmonary vascularity are normal. Lungs are clear. No pneumothorax. No pleural effusions. Degenerative changes in the spine. IMPRESSION: Endotracheal tube tip measures 6.4 cm above the carina. Lungs are clear. Electronically Signed   By: Gwyndolyn Saxon  Gerilyn Nestle M.D.   On: 09/10/2017 22:57   Dg Chest Portable 1 View  Result Date: 09/10/2017 CLINICAL DATA:  Sudden onset shortness of breath, low O2 sats EXAM: PORTABLE CHEST 1 VIEW COMPARISON:  08/29/2017 FINDINGS: Heart and mediastinal contours are within normal limits. No focal opacities or effusions. No acute bony abnormality. IMPRESSION: No active disease. Electronically Signed   By: Rolm Baptise M.D.   On: 09/10/2017 11:35   Dg Abd Portable 1v  Result Date: 09/11/2017 CLINICAL DATA:  OG tube placement. EXAM: PORTABLE ABDOMEN - 1 VIEW COMPARISON:  CT abdomen and pelvis 08/22/2017.  Abdomen 08/20/2017 FINDINGS: Enteric tube tip is in the left upper quadrant consistent with location in the upper stomach. Visualized bowel loops are not distended. IMPRESSION: Enteric tube tip projects over the upper stomach. Electronically Signed   By: Lucienne Capers M.D.   On: 09/11/2017 00:14    Assessment and Plan:   1. Troponin elevation: likely multifactorial in setting of presumed massive PE, AKI, and demand ischemia 2/2 PEA x2.  - Troponin trend: 0.09>1.33>2.77>4.50>5.18>4.42 - Initial TTE with evidence of RA and IVC clot. While in radiology for VQ scan, patient with acutely worsening dyspnea not responsive to Bipap, with subsequent PEA arrest x2. Patient was intubated and started on TPA for presumed massive PE. Repeat TTE with severely dilated RV, without evidence of RA clot, supporting PE diagnosis.  - No need for further ischemic work-up at this time.   2. PEA: 2/2 PE. S/p successful resuscitation x2. Weaned off pressors today. Post-arrest echo with EF 60-65% and no regional wall motion abnormalities.   3. Suspected massive  PE: attempts to obtain VQ scan unsuccessful due to acute decompensation and PEA arrest. S/p TPA, now on heparin gtt. TTE with evidence of right heart strain, initially with RA thrombus which is no longer present on repeat. EKG with new incomplete RBBB. Unable to get CT chest due to AKI.   4. Acute on chronic kidney disease: baseline Cr. 1.9-2 - Cr uptrending to 3.71 limiting further cardiac and pulmonary evaluation.  5. History of bradyarrhythmia: patient with bradycardia on previous admission requiring temporary pace wire. Thought to be vagally mediated, therefore temp wire removed.  - would consider event monitor at discharge for further cardiac monitoring   For questions or updates, please contact Caleb Singh Please consult [email or web address] for contact info under Cardiology/STEMI.   Signed, Abigail Butts, PA-C  09/12/2017 11:14 AM [phone]   History and all data above reviewed.  Patient examined.  I agree with the findings as above.  The patient is known to me from a previous hospitalization at which point he had bradycardia felt to be vagal.  He required temporary pacing (demand) for several days.  He had a complicated hospital course as above and went to rehab.  He came back with SOB and chest pain and was found to have clot in his RA.  He had PEA arrest and on follow up echo no longer has the large mobile RA clot.  He has evidence of RV dysfunction.  He does have a new RBBB.  It is obvious that he has thrown a PE and likely was doing this before admission.  He has an elevated troponin.  Of note his PEA seems to have been preceded by progressive bradycardia with rates into the 20s that was immediately before or during the arrest.  He has actually done quite well following this event and has had no further bradyarrhythmias.  He denies acute SOB  or pain.    The patient exam reveals COR:RRR  ,  Lungs: Clear  ,  Abd: Positive bowel sounds, no rebound no guarding, Ext Mild edema  .  All  available labs, radiology testing, previous records reviewed. Agree with documented assessment and plan. PEA:  Related to pulmonary embolism.  Heparin for now and will consider DOAC vs warfarin when we are sure there is no further need for invasive evaluation and when we know whether his renal function improves.  I do not suspect an acute ischemic event.  Troponin elevation is very likely related to RV strain which is evident.  Bradycardia:  I do not suspect that primary bradyarrhythmia was the event.  I think this was secondary to PE although we have not had a lung imaging.  I do think that he needs tele throughout this hospitalization and that he will need an out patient monitor as well.    Jeneen Rinks Aldrick Derrig  3:16 PM  09/12/2017

## 2017-09-12 NOTE — Progress Notes (Signed)
ANTICOAGULATION CONSULT NOTE  Pharmacy Consult for heparin Indication: pulmonary embolus  No Known Allergies  Patient Measurements: Height: 5\' 10"  (177.8 cm) Weight: 173 lb 4.5 oz (78.6 kg) IBW/kg (Calculated) : 73 Heparin Dosing Weight: 68 kg  Vital Signs: Temp: 97.9 F (36.6 C) (01/24 1950) Temp Source: Oral (01/24 1950) BP: 124/68 (01/24 2200) Pulse Rate: 96 (01/24 2200)  Labs: Recent Labs    09/11/17 0042  09/11/17 0541  09/11/17 1209  09/11/17 2155 09/12/17 0404 09/12/17 1158 09/12/17 1438 09/12/17 2030 09/12/17 2201  HGB 5.1*  --   --    < >  --    < >  --  6.7* 8.0* 8.1* 8.8*  --   HCT 16.0*  --   --    < >  --    < >  --  19.8* 23.4* 23.9* 26.0*  --   PLT 197  --   --    < >  --    < >  --  183 156 158  --   --   APTT 197*  --  118*  --  56*  --   --   --   --   --   --   --   LABPROT 38.8*  --   --   --   --   --   --   --   --   --   --   --   INR 4.02*  --   --   --   --   --   --   --   --   --   --   --   HEPARINUNFRC <0.10*   < >  --   --   --    < >  --  <0.10* 0.17*  --   --  0.32  CREATININE 2.99*  --  3.30*  --   --   --   --  3.71*  --  4.00*  --   --   TROPONINI 1.14*  --  2.77*   < >  --    < > 4.42*  --  1.82* 1.62*  --   --    < > = values in this interval not displayed.    Estimated Creatinine Clearance: 19.5 mL/min (A) (by C-G formula based on SCr of 4 mg/dL (H)).  Assessment: [name] on Heparin for r/o PE - now s/p 2 [facility].  Noted Bilateral lower extremities are positive for acute deep vein thrombosis involving bilateral posterior tibial and peroneal veins.   Heparin level is therapeutic (drawn 1 hr early) on 1700 units/hr.  Hgb up to 8.8. Blood noted for ostomy and rectums so not bolusing at this time.   Goal of Therapy:  Heparin level 0.3-0.7 units/ml Monitor platelets by anticoagulation protocol: Yes   Plan:  Continue heparin at 1700 units/hr.  Daily HL CBC   Link SnufferJessica Treven Holtman, PharmD, BCPS, BCCCP Clinical  Pharmacist Clinical phone 09/12/2017 until 11PM [phone] After hours, please call #[phone] 09/12/2017 10:49 PM

## 2017-09-12 NOTE — Progress Notes (Signed)
ANTICOAGULATION CONSULT NOTE  Pharmacy Consult for heparin Indication: pulmonary embolus  No Known Allergies  Patient Measurements: Height: 5\' 10"  (177.8 cm) Weight: 173 lb 4.5 oz (78.6 kg) IBW/kg (Calculated) : 73 Heparin Dosing Weight: 68 kg  Vital Signs: Temp: 98.2 F (36.8 C) (01/24 1147) Temp Source: Oral (01/24 1147) BP: 112/68 (01/24 1300) Pulse Rate: 92 (01/24 1300)  Labs: Recent Labs    09/11/17 0042  09/11/17 0541  09/11/17 0949 09/11/17 1209 09/11/17 1439 09/11/17 1944 09/11/17 2154 09/11/17 2155 09/12/17 0404 09/12/17 1158  HGB 5.1*  --   --    < >  --   --  8.0*  --  7.4*  --  6.7* 8.0*  HCT 16.0*  --   --    < >  --   --  23.5*  --  21.6*  --  19.8* 23.4*  PLT 197  --   --    < >  --   --  200  --  184  --  183 156  APTT 197*  --  118*  --   --  56*  --   --   --   --   --   --   LABPROT 38.8*  --   --   --   --   --   --   --   --   --   --   --   INR 4.02*  --   --   --   --   --   --   --   --   --   --   --   HEPARINUNFRC <0.10*   < >  --   --   --   --   --  <0.10*  --   --  <0.10* 0.17*  CREATININE 2.99*  --  3.30*  --   --   --   --   --   --   --  3.71*  --   TROPONINI 1.14*  --  2.77*  --  4.50*  --  5.18*  --   --  4.42*  --   --    < > = values in this interval not displayed.    Estimated Creatinine Clearance: 21 mL/min (A) (by C-G formula based on SCr of 3.71 mg/dL (H)).  Assessment: [name] on Heparin for r/o PE - now s/p 2 PEA arrests with [facility].  Noted Bilateral lower extremities are positive for acute deep vein thrombosis involving bilateral posterior tibial and peroneal veins.  Heparin level is subtherapeutic at 0.17.  Hgb this morning 6.7. Hgb now 8.0 s/p one unit of blood. RN reports heparin has been infusing appropriately. RN reports bleeding from ostomy and rectum, less from this morning. In AM per NP, hold heparin bolus due to active bleeding.    Goal of Therapy:  Heparin level 0.3-0.7 units/ml Monitor platelets by  anticoagulation protocol: Yes   Plan:  Increase heparin to 1700 units/hr Check HL @ 2100 Daily HL, CBC Monitor bleeding   Anselm PancoastAmy Alan Riles, PharmD Candidate 09/12/2017 1:29 PM

## 2017-09-12 NOTE — Progress Notes (Addendum)
CRITICAL VALUE ALERT  Critical Value:  calcium 6.4  Date & Time Notied: 09/12/17 0447  Provider Notified: A. Earlene PlaterWallace  Orders Received/Actions taken: Hepatic function panel ordered

## 2017-09-12 NOTE — Progress Notes (Signed)
CRITICAL VALUE ALERT  Critical Value:  Hemoglobin 6.7  Date & Time Notied:  09/12/17 0550  Provider Notified: Dr. Sylvie FarrierA. Wallace  Orders Received/Actions taken: Awaiting orders

## 2017-09-12 NOTE — Progress Notes (Signed)
Patient hypoglycemic to 6.4, corrects to 8.4. No intervention recommended at this time.

## 2017-09-12 NOTE — Consult Note (Addendum)
WOC Nurse wound consult note Reason for Consult: midline surgical wound Wound type: surgical  Pressure Injury POA: NA Measurement: 13cm x 4cm x 1.0cm  Wound bed:25% yellow slough centrally, 75% pink, granulation tissue, some visible sutures  Drainage (amount, consistency, odor) minimal, serosanguinous, no odor Periwound: intact  Dressing procedure/placement/frequency: Saline moist gauze dressings twice a day, cover with ABD pad, secure with tape.  WOC nurse notified CCS PA [place] of wound and current status.    WOC Nurse ostomy consult note Stoma type/location: RLQ, end ileostomy  Stomal assessment/size: oval shaped 1 3/8" x 1 3/4", budded, pink, moist Peristomal assessment: mucocutaneous separation has healed  Treatment options for stomal/peristomal skin: used barrier ring today, just because patient has such high volume output and it is ileostomy drainage.  Output liquid, green Ostomy pouching: 2pc. 2 3/4" High output pouch used today, hooked to BSD until patient is more mobile  Education provided: patient had received some training prior to DC. Today he was recently extubated, will re-evaluate patient early next week for additional education Enrolled patient in [employer] Start DC program: Yes, previous admission  WOC Nurse will follow along with you for continued support with ostomy teaching and care Azariyah [facility] MSN, RN, [place], CNS, [place] [phone]

## 2017-09-12 NOTE — Progress Notes (Signed)
CRITICAL VALUE ALERT  Critical Value:  Ca+ 6.4  Date & Time Notied: 09/12/17 1625  Provider Notified: L. Harbrecht MD  Orders Received/Actions taken: Awaiting orders

## 2017-09-12 NOTE — Progress Notes (Signed)
Patient failed swallow eval earlier today post-extubation. 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 Will have RN place NG tube and will start scheduled PO Bicarb. Consider Renal consult in AM. Also, calcium level has been low but patient also has hypoalbuminemia. Will check an ionized calcium now.

## 2017-09-13 ENCOUNTER — Inpatient Hospital Stay (HOSPITAL_COMMUNITY): Payer: Non-veteran care

## 2017-09-13 DIAGNOSIS — R0902 Hypoxemia: Secondary | ICD-10-CM

## 2017-09-13 DIAGNOSIS — R1310 Dysphagia, unspecified: Secondary | ICD-10-CM

## 2017-09-13 DIAGNOSIS — A419 Sepsis, unspecified organism: Secondary | ICD-10-CM

## 2017-09-13 LAB — URINALYSIS, ROUTINE W REFLEX MICROSCOPIC
BILIRUBIN URINE: NEGATIVE
Glucose, UA: 50 mg/dL — AB
Ketones, ur: NEGATIVE mg/dL
NITRITE: NEGATIVE
PH: 6 (ref 5.0–8.0)
Protein, ur: 30 mg/dL — AB
SPECIFIC GRAVITY, URINE: 1.009 (ref 1.005–1.030)

## 2017-09-13 LAB — GLUCOSE, CAPILLARY
GLUCOSE-CAPILLARY: 96 mg/dL (ref 65–99)
Glucose-Capillary: 90 mg/dL (ref 65–99)
Glucose-Capillary: 85 mg/dL (ref 65–99)
Glucose-Capillary: 87 mg/dL (ref 65–99)
Glucose-Capillary: 93 mg/dL (ref 65–99)

## 2017-09-13 LAB — COMPREHENSIVE METABOLIC PANEL
ALBUMIN: 1.7 g/dL — AB (ref 3.5–5.0)
ALK PHOS: 185 U/L — AB (ref 38–126)
ALT: 1701 U/L — ABNORMAL HIGH (ref 17–63)
ANION GAP: 16 — AB (ref 5–15)
AST: 1411 U/L — AB (ref 15–41)
BUN: 54 mg/dL — ABNORMAL HIGH (ref 6–20)
CO2: 30 mmol/L (ref 22–32)
Calcium: 6.6 mg/dL — ABNORMAL LOW (ref 8.9–10.3)
Chloride: 92 mmol/L — ABNORMAL LOW (ref 101–111)
Creatinine, Ser: 4.21 mg/dL — ABNORMAL HIGH (ref 0.61–1.24)
GFR calc Af Amer: 16 mL/min — ABNORMAL LOW (ref >60)
GFR calc non Af Amer: 14 mL/min — ABNORMAL LOW (ref >60)
Glucose, Bld: 93 mg/dL (ref 65–99)
POTASSIUM: 3.7 mmol/L (ref 3.5–5.1)
SODIUM: 138 mmol/L (ref 135–145)
Total Bilirubin: 1.4 mg/dL — ABNORMAL HIGH (ref 0.3–1.2)
Total Protein: 5.2 g/dL — ABNORMAL LOW (ref 6.5–8.1)

## 2017-09-13 LAB — PHOSPHORUS
Phosphorus: 6.6 mg/dL — ABNORMAL HIGH (ref 2.5–4.6)
Phosphorus: 6.6 mg/dL — ABNORMAL HIGH (ref 2.5–4.6)

## 2017-09-13 LAB — MAGNESIUM
MAGNESIUM: 1.7 mg/dL (ref 1.7–2.4)
Magnesium: 1.8 mg/dL (ref 1.7–2.4)
Magnesium: 2 mg/dL (ref 1.7–2.4)

## 2017-09-13 LAB — CBC
HCT: 25.3 % — ABNORMAL LOW (ref 39.0–52.0)
HEMOGLOBIN: 8.4 g/dL — AB (ref 13.0–17.0)
MCH: 29 pg (ref 26.0–34.0)
MCHC: 33.2 g/dL (ref 30.0–36.0)
MCV: 87.2 fL (ref 78.0–100.0)
Platelets: 168 10*3/uL (ref 150–400)
RBC: 2.9 MIL/uL — ABNORMAL LOW (ref 4.22–5.81)
RDW: 15.1 % (ref 11.5–15.5)
WBC: 15.8 10*3/uL — AB (ref 4.0–10.5)

## 2017-09-13 LAB — FIBRINOGEN: Fibrinogen: 241 mg/dL (ref 210–475)

## 2017-09-13 LAB — URINE CULTURE: Culture: NO GROWTH

## 2017-09-13 LAB — HEPARIN LEVEL (UNFRACTIONATED)
Heparin Unfractionated: 0.31 IU/mL (ref 0.30–0.70)
Heparin Unfractionated: 0.37 IU/mL (ref 0.30–0.70)

## 2017-09-13 MED ORDER — PRO-STAT SUGAR FREE PO LIQD
30.0000 mL | Freq: Two times a day (BID) | ORAL | Status: DC
  Filled 2017-09-13: qty 30

## 2017-09-13 MED ORDER — DARBEPOETIN ALFA 100 MCG/0.5ML IJ SOSY
100.0000 ug | PREFILLED_SYRINGE | INTRAMUSCULAR | Status: DC
  Administered 2017-09-13 – 2017-09-20 (×2): 100 ug via SUBCUTANEOUS
  Filled 2017-09-13 (×4): qty 0.5

## 2017-09-13 MED ORDER — VITAL HIGH PROTEIN PO LIQD: 1000.0000 mL | ORAL | Status: DC

## 2017-09-13 MED ORDER — PRO-STAT SUGAR FREE PO LIQD
30.0000 mL | Freq: Two times a day (BID) | ORAL | Status: DC
  Administered 2017-09-13 – 2017-09-16 (×4): 30 mL
  Filled 2017-09-13 (×4): qty 30

## 2017-09-13 MED ORDER — SODIUM CHLORIDE 0.9 % IV SOLN
1.0000 g | Freq: Once | INTRAVENOUS | Status: AC
  Administered 2017-09-13: 1 g via INTRAVENOUS
  Filled 2017-09-13: qty 10

## 2017-09-13 MED ORDER — JEVITY 1.2 CAL PO LIQD
1000.0000 mL | ORAL | Status: DC
  Administered 2017-09-13 – 2017-09-16 (×5): 1000 mL
  Filled 2017-09-13 (×9): qty 1000

## 2017-09-13 MED ORDER — PRO-STAT SUGAR FREE PO LIQD
30.0000 mL | Freq: Three times a day (TID) | ORAL | Status: DC
  Filled 2017-09-13: qty 30

## 2017-09-13 MED ORDER — FUROSEMIDE 10 MG/ML IJ SOLN
80.0000 mg | Freq: Two times a day (BID) | INTRAMUSCULAR | Status: DC
  Administered 2017-09-13 – 2017-09-15 (×5): 80 mg via INTRAVENOUS
  Filled 2017-09-13 (×5): qty 8

## 2017-09-13 NOTE — Progress Notes (Signed)
Initial Nutrition Assessment  DOCUMENTATION CODES:   Non-severe (moderate) malnutrition in context of acute illness/injury  INTERVENTION:    Jevity 1.2 at 70 ml/h (1680 ml per day)  Pro-stat 30 ml BID  Provides 2216 kcal, 123 gm protein, 1361 ml free water daily  NUTRITION DIAGNOSIS:   Moderate Malnutrition related to acute illness(recent SBO s/p surgery) as evidenced by mild fat depletion, mild muscle depletion.  GOAL:   Patient will meet greater than or equal to 90% of their needs  MONITOR:   TF tolerance, Skin, I & O's, Weight trends, Labs  REASON FOR ASSESSMENT:   Consult Enteral/tube feeding initiation and management  ASSESSMENT:   62 yo male with PMH of PE, tobacco abuse, recent hospitalization for bowel obstruction (d/c to SNF on 1/12), pneumothorax, colostomy who was admitted on 1/22 with submassive PE; had PEA arrest during attempt at EKOS. Required intubation from 1/22-1/24.   Discussed patient in ICU rounds and with RN today. Patient was extubated on 1/24. S/P bedside swallow evaluation with SLP this morning; recommended NPO. NGT in place. Received MD Consult for TF initiation and management. Labs and medications reviewed. Patient with weight loss, but unable to determine amount of weight loss due to current positive volume status / edema. He thinks his usual weight is ~175 lbs.   NUTRITION - FOCUSED PHYSICAL EXAM:    Most Recent Value  Orbital Region  Mild depletion  Upper Arm Region  No depletion  Thoracic and Lumbar Region  Mild depletion  Buccal Region  Mild depletion  Temple Region  Moderate depletion  Clavicle Bone Region  Mild depletion  Clavicle and Acromion Bone Region  Mild depletion  Scapular Bone Region  Moderate depletion  Dorsal Hand  Mild depletion  Patellar Region  Unable to assess  Anterior Thigh Region  Unable to assess  Posterior Calf Region  Unable to assess  Edema (RD Assessment)  Moderate  Hair  Reviewed  Eyes  Reviewed   Mouth  Reviewed  Skin  Reviewed  Nails  Reviewed       Diet Order:  No diet orders on file  EDUCATION NEEDS:   No education needs have been identified at this time  Skin:  Skin Assessment: Skin Integrity Issues: Skin Integrity Issues:: Stage II, Incisions Stage II: scrotum Incisions: abdomen  Last BM:  1/25 (ileostomy)  Height:   Ht Readings from Last 1 Encounters:  09/10/17 5\' 10"  (1.778 m)    Weight:   Wt Readings from Last 1 Encounters:  09/13/17 176 lb 9.4 oz (80.1 kg)    Ideal Body Weight:  75.5 kg  BMI:  Body mass index is 25.34 kg/m.  Estimated Nutritional Needs:   Kcal:  2100-2300  Protein:  120-135 gm  Fluid:  2.1-2.3 L   Joaquin CourtsKimberly Rubert Frediani, RD, LDN, CNSC Pager [phone] After Hours Pager [phone]

## 2017-09-13 NOTE — Evaluation (Signed)
Physical Therapy Evaluation Patient Details Name: Caleb Singh MRN: [id number] DOB: [birth year] Today's Date: 09/13/2017   History of Present Illness  Pt is a 61 y/o male admitted from SNF secondary to dyspnea. Pt is a smoker (40 pack years) with hx PE, who suffered from a perforated small bowel on December 24 for which he underwent small bowel resection and subsequently suffered from multisystem failure and required CRRT. Course was c/b E Coli bacteremia, respiratory failure, brief arrest, and intra-abdominal abscess which was drained percutaneously.  He was discharged 1/12 to rehab where he has been having difficulties with chronic dyspnea.  He returned 1/22 with worsening dyspnea and DOE with chest tightness.   He was admitted for respiratory failure and was undergoing w/u to r/o PE. He was in VQ scan when he had worsening dyspnea despite bipap, had rapid decline and ultimately PEA arrest x 2.  He received systemic TPA and TTE suspicious for RA and IVC clot.  He had ongoing profound shock, coagulopathy and significant anemia.     Clinical Impression  Pt presented supine in bed with HOB elevated, awake and willing to participate in therapy session. Prior to admission, pt reported that he was at [facility] and was able to ambulate short distances with RW. Pt currently limited secondary to fatigue. Pt required min A x2 for safety and stability with transfers. Pt would continue to benefit from skilled physical therapy services at this time while admitted and after d/c to address the below listed limitations in order to improve overall safety and independence with functional mobility.     Follow Up Recommendations SNF    Equipment Recommendations  None recommended by PT    Recommendations for Other Services       Precautions / Restrictions Precautions Precautions: Fall Precaution Comments: NG tube, colostomy, foley Restrictions Weight Bearing Restrictions: No      Mobility  Bed  Mobility Overal bed mobility: Needs Assistance Bed Mobility: Supine to Sit     Supine to sit: Min assist;+2 for safety/equipment     General bed mobility comments: pt required increased time and effort, assist for safety and line management  Transfers Overall transfer level: Needs assistance Equipment used: 2 person hand held assist Transfers: Sit to/from Stand;Stand Pivot Transfers Sit to Stand: +2 safety/equipment;Min assist Stand pivot transfers: +2 safety/equipment;Min assist       General transfer comment: pt required increased time, assist for stability with transitional movements and line management  Ambulation/Gait                Stairs            Wheelchair Mobility    Modified Rankin (Stroke Patients Only)       Balance Overall balance assessment: Needs assistance Sitting-balance support: Feet supported;No upper extremity supported Sitting balance-Leahy Scale: Fair     Standing balance support: Bilateral upper extremity supported;During functional activity Standing balance-Leahy Scale: Poor Standing balance comment: reliant on external support                             Pertinent Vitals/Pain Pain Assessment: No/denies pain    Home Living Family/patient expects to be discharged to:: Skilled nursing facility                 Additional Comments: [name]    Prior Function Level of Independence: Needs assistance   Gait / Transfers Assistance Needed: previously ambulated short distances with RW at  SNF  ADL's / Homemaking Assistance Needed: required assistance from ADLs        Hand Dominance        Extremity/Trunk Assessment   Upper Extremity Assessment Upper Extremity Assessment: Defer to OT evaluation    Lower Extremity Assessment Lower Extremity Assessment: Generalized weakness       Communication   Communication: No difficulties  Cognition Arousal/Alertness: Awake/alert Behavior During Therapy:  WFL for tasks assessed/performed Overall Cognitive Status: Within Functional Limits for tasks assessed                                        General Comments      Exercises     Assessment/Plan    PT Assessment Patient needs continued PT services  PT Problem List Decreased strength;Decreased activity tolerance;Decreased mobility;Decreased balance;Decreased coordination;Decreased knowledge of precautions       PT Treatment Interventions DME instruction;Gait training;Stair training;Therapeutic activities;Therapeutic exercise;Functional mobility training;Neuromuscular re-education;Balance training;Patient/family education    PT Goals (Current goals can be found in the Care Plan section)  Acute Rehab PT Goals Patient Stated Goal: [facility] PT Goal Formulation: With patient Time For Goal Achievement: 09/27/17 Potential to Achieve Goals: Good    Frequency Min 2X/week   Barriers to discharge        Co-evaluation PT/OT/SLP Co-Evaluation/Treatment: Yes Reason for Co-Treatment: To address functional/ADL transfers;For patient/therapist safety PT goals addressed during session: Mobility/safety with mobility;Balance;Strengthening/ROM         AM-PAC PT "6 Clicks" Daily Activity  Outcome Measure Difficulty turning over in bed (including adjusting bedclothes, sheets and blankets)?: A Little Difficulty moving from lying on back to sitting on the side of the bed? : Unable Difficulty sitting down on and standing up from a chair with arms (e.g., wheelchair, bedside commode, etc,.)?: Unable Help needed moving to and from a bed to chair (including a wheelchair)?: A Little Help needed walking in hospital room?: A Little Help needed climbing 3-5 steps with a railing? : A Lot 6 Click Score: 13    End of Session Equipment Utilized During Treatment: Gait belt;Oxygen Activity Tolerance: Patient limited by fatigue Patient left: in chair;with call  bell/phone within reach;with family/visitor present Nurse Communication: Mobility status PT Visit Diagnosis: Other abnormalities of gait and mobility (R26.89);Muscle weakness (generalized) (M62.81)    Time: [id number] PT Time Calculation (min) (ACUTE ONLY): 21 min   Charges:   PT Evaluation $PT Eval Moderate Complexity: 1 Mod     PT G Codes:        OregonJennifer Eveleen Mcnear, PT, DPT [phone]   Alessandra BevelsJennifer M Zain Bingman 09/13/2017, 1:37 PM

## 2017-09-13 NOTE — Progress Notes (Signed)
CSW received call from [facility] representative and was informed that if pt is off certain IV antibiotics then pt can come back to the facility along with an LOG. Per representative pt would need an LOG to get placement at this facility. CSW will continue to follow for needs.      Claude MangesKierra S. Britanny Marksberry, MSW, LCSW-A Emergency Department Clinical Social Worker [phone]

## 2017-09-13 NOTE — Evaluation (Signed)
Occupational Therapy Evaluation Patient Details Name: Caleb Singh MRN: [id number] DOB: 1956-03-15 Today's Date: 09/13/2017    History of Present Illness Pt is a 62 y/o male admitted from SNF secondary to dyspnea. Pt is a smoker (40 pack years) with hx PE, who suffered from a perforated small bowel on December 24 for which he underwent small bowel resection and subsequently suffered from multisystem failure and required CRRT. Course was c/b E Coli bacteremia, respiratory failure, brief arrest, and intra-abdominal abscess which was drained percutaneously.  He was discharged 1/12 to rehab where he has been having difficulties with chronic dyspnea.  He returned 1/22 with worsening dyspnea and DOE with chest tightness.   He was admitted for respiratory failure and was undergoing w/u to r/o PE. He was in VQ scan when he had worsening dyspnea despite bipap, had rapid decline and ultimately PEA arrest x 2.  He received systemic TPA and TTE suspicious for RA and IVC clot.  He had ongoing profound shock, coagulopathy and significant anemia.    Clinical Impression   Pt with long, complicated medical hx, but is highly motivated to return to independence. He has been able to walk short distances and participate in ADL with assistance at SNF. He presents with generalized weakness and impaired balance interfering with ability to perform mobility and self care. Pt requires 2 person assist for OOB mobility, primarily for safety and multiple lines. Pt will need further rehab upon discharge and plans to return to SNF. Will follow acutely.    Follow Up Recommendations  SNF    Equipment Recommendations       Recommendations for Other Services       Precautions / Restrictions Precautions Precautions: Fall Precaution Comments: NG tube, colostomy, foley Restrictions Weight Bearing Restrictions: No      Mobility Bed Mobility Overal bed mobility: Needs Assistance Bed Mobility: Supine to Sit     Supine  to sit: Min assist;+2 for safety/equipment     General bed mobility comments: pt required increased time and effort, assist for safety and line management  Transfers Overall transfer level: Needs assistance Equipment used: 2 person hand held assist Transfers: Sit to/from Stand;Stand Pivot Transfers Sit to Stand: +2 safety/equipment;Min assist Stand pivot transfers: +2 safety/equipment;Min assist       General transfer comment: pt required increased time, assist for stability with transitional movements and line management    Balance Overall balance assessment: Needs assistance Sitting-balance support: Feet supported;No upper extremity supported Sitting balance-Leahy Scale: Fair     Standing balance support: Bilateral upper extremity supported;During functional activity Standing balance-Leahy Scale: Poor Standing balance comment: reliant on external support                           ADL either performed or assessed with clinical judgement   ADL Overall ADL's : Needs assistance/impaired Eating/Feeding: NPO   Grooming: Set up;Sitting   Upper Body Bathing: Minimal assistance;Sitting   Lower Body Bathing: Sit to/from stand;Total assistance   Upper Body Dressing : Minimal assistance;Sitting   Lower Body Dressing: Total assistance;Sit to/from stand   Toilet Transfer: Minimal assistance;+2 for safety/equipment;Stand-pivot Toilet Transfer Details (indicate cue type and reason): simulated to chair Toileting- Clothing Manipulation and Hygiene: Total assistance;Sit to/from stand         General ADL Comments: pt highly motivated to get OOB with therapy     Vision Patient Visual Report: No change from baseline       Perception  Praxis      Pertinent Vitals/Pain Pain Assessment: No/denies pain     Hand Dominance Right   Extremity/Trunk Assessment Upper Extremity Assessment Upper Extremity Assessment: Generalized weakness(edematous)   Lower  Extremity Assessment Lower Extremity Assessment: Defer to PT evaluation       Communication Communication Communication: No difficulties   Cognition Arousal/Alertness: Awake/alert Behavior During Therapy: WFL for tasks assessed/performed Overall Cognitive Status: Within Functional Limits for tasks assessed                                     General Comments       Exercises     Shoulder Instructions      Home Living Family/patient expects to be discharged to:: Skilled nursing facility                                 Additional Comments: [name]      Prior Functioning/Environment Level of Independence: Needs assistance  Gait / Transfers Assistance Needed: previously ambulated short distances with RW at SNF ADL's / Homemaking Assistance Needed: required assistance from ADLs            OT Problem List: Decreased strength;Decreased activity tolerance;Decreased knowledge of precautions;Cardiopulmonary status limiting activity;Impaired UE functional use;Impaired balance (sitting and/or standing)      OT Treatment/Interventions: Self-care/ADL training;DME and/or AE instruction;Energy conservation;Therapeutic exercise;Patient/family education;Therapeutic activities    OT Goals(Current goals can be found in the care plan section) Acute Rehab OT Goals Patient Stated Goal: return to [name] to get stronger OT Goal Formulation: With patient Time For Goal Achievement: 09/27/17 Potential to Achieve Goals: Good ADL Goals Pt Will Perform Grooming: with min assist;standing Pt Will Perform Upper Body Dressing: with supervision;sitting Pt Will Perform Lower Body Dressing: with mod assist;sit to/from stand Pt Will Transfer to Toilet: with min assist;ambulating;bedside commode Pt Will Perform Toileting - Clothing Manipulation and hygiene: with min assist;sit to/from stand  OT Frequency: Min 2X/week   Barriers to D/C:             Co-evaluation PT/OT/SLP Co-Evaluation/Treatment: Yes Reason for Co-Treatment: For patient/therapist safety PT goals addressed during session: Mobility/safety with mobility;Balance;Strengthening/ROM OT goals addressed during session: ADL's and self-care      AM-PAC PT "6 Clicks" Daily Activity     Outcome Measure Help from another person eating meals?: Total Help from another person taking care of personal grooming?: A Little Help from another person toileting, which includes using toliet, bedpan, or urinal?: Total Help from another person bathing (including washing, rinsing, drying)?: A Lot Help from another person to put on and taking off regular upper body clothing?: A Little Help from another person to put on and taking off regular lower body clothing?: Total 6 Click Score: 11   End of Session Equipment Utilized During Treatment: Gait belt;Oxygen Nurse Communication: (ok to get up if femoral dressing is dry)  Activity Tolerance: Patient tolerated treatment well Patient left: in chair;with call bell/phone within reach;with family/visitor present  OT Visit Diagnosis: Muscle weakness (generalized) (M62.81);Unsteadiness on feet (R26.81)                Time: 4098-1191 OT Time Calculation (min): 23 min Charges:  OT General Charges $OT Visit: 1 Visit OT Evaluation $OT Eval Moderate Complexity: 1 Mod G-Codes:     2017/10/10 Martie Round, [name] Pager: [phone]  Lyvonne Cassell,  [place] BailiffJulie Lynn 09/13/2017, 2:19 PM

## 2017-09-13 NOTE — Progress Notes (Signed)
OT Cancellation Note  Patient Details Name: Caleb Singh MRN: [id number] DOB: 01/15/1956   Cancelled Treatment:    Reason Eval/Treat Not Completed: Medical issues which prohibited therapy(Pt with femoral catheter. Will await removal.)  [name], [name] 09/13/2017, 10:50 AM

## 2017-09-13 NOTE — Progress Notes (Addendum)
CSW spoke with representative from [facility] to see about pt returning to the facility for more rehab if needed at the time of discharge. Per representative she would call CSW back and let CSW know as she has to check with supervisor first. CSW remains available for any discharge needs at this time.     Claude MangesKierra S. Lennox Dolberry, MSW, LCSW-A Emergency Department Clinical Social Worker [phone]

## 2017-09-13 NOTE — Progress Notes (Signed)
ANTICOAGULATION CONSULT NOTE  Pharmacy Consult for heparin Indication: pulmonary embolus  No Known Allergies  Patient Measurements: Height: 5\' 10"  (177.8 cm) Weight: 178 lb 12.7 oz (81.1 kg) IBW/kg (Calculated) : 73 Heparin Dosing Weight: 68 kg  Vital Signs: Temp: 97.9 F (36.6 C) (01/25 1525) Temp Source: Oral (01/25 1525) BP: 114/66 (01/25 1719) Pulse Rate: 99 (01/25 1525)  Labs: Recent Labs    09/11/17 0042  09/11/17 0541  09/11/17 1209  09/12/17 0404 09/12/17 1158 09/12/17 1438 09/12/17 2030 09/12/17 2201 09/13/17 0400 09/13/17 1905  HGB 5.1*  --   --    < >  --    < > 6.7* 8.0* 8.1* 8.8*  --  8.4*  --   HCT 16.0*  --   --    < >  --    < > 19.8* 23.4* 23.9* 26.0*  --  25.3*  --   PLT 197  --   --    < >  --    < > 183 156 158  --   --  168  --   APTT 197*  --  118*  --  56*  --   --   --   --   --   --   --   --   LABPROT 38.8*  --   --   --   --   --   --   --   --   --   --   --   --   INR 4.02*  --   --   --   --   --   --   --   --   --   --   --   --   HEPARINUNFRC <0.10*   < >  --   --   --    < > <0.10* 0.17*  --   --  0.32 0.31 0.37  CREATININE 2.99*  --  3.30*  --   --   --  3.71*  --  4.00*  --   --  4.21*  --   TROPONINI 1.14*  --  2.77*   < >  --    < >  --  1.82* 1.62*  --  1.08*  --   --    < > = values in this interval not displayed.    Estimated Creatinine Clearance: 18.5 mL/min (A) (by C-G formula based on SCr of 4.21 mg/dL (H)).  Assessment: [name] on Heparin for r/o PE - now s/p 2 [facility].  Noted Bilateral lower extremities are positive for acute deep vein thrombosis involving bilateral posterior tibial and peroneal veins.   Heparin level remains therapeutic this evening at 0.37 on 1700 units/hr. No increased bleeding noted in chart.   Goal of Therapy:  Heparin level 0.3-0.7 units/ml Monitor platelets by anticoagulation protocol: Yes   Plan:  Continue heparin at 1700 units/hr Daily HL, CBC Monitor bleeding     Sharin MonsEmily Vernadette Stutsman, PharmD, BCPS PGY2 Infectious Diseases Pharmacy Resident Pager: [phone]  09/13/2017 8:19 PM

## 2017-09-13 NOTE — Progress Notes (Signed)
ANTICOAGULATION CONSULT NOTE  Pharmacy Consult for heparin Indication: pulmonary embolus  No Known Allergies  Patient Measurements: Height: 5\' 10"  (177.8 cm) Weight: 176 lb 9.4 oz (80.1 kg) IBW/kg (Calculated) : 73 Heparin Dosing Weight: 68 kg  Vital Signs: Temp: 98.4 F (36.9 C) (01/25 0744) Temp Source: Oral (01/25 0744) BP: 111/71 (01/25 0900) Pulse Rate: 98 (01/25 0900)  Labs: Recent Labs    09/11/17 0042  09/11/17 0541  09/11/17 1209  09/12/17 0404 09/12/17 1158 09/12/17 1438 09/12/17 2030 09/12/17 2201 09/13/17 0400  HGB 5.1*  --   --    < >  --    < > 6.7* 8.0* 8.1* 8.8*  --  8.4*  HCT 16.0*  --   --    < >  --    < > 19.8* 23.4* 23.9* 26.0*  --  25.3*  PLT 197  --   --    < >  --    < > 183 156 158  --   --  168  APTT 197*  --  118*  --  56*  --   --   --   --   --   --   --   LABPROT 38.8*  --   --   --   --   --   --   --   --   --   --   --   INR 4.02*  --   --   --   --   --   --   --   --   --   --   --   HEPARINUNFRC <0.10*   < >  --   --   --    < > <0.10* 0.17*  --   --  0.32 0.31  CREATININE 2.99*  --  3.30*  --   --   --  3.71*  --  4.00*  --   --  4.21*  TROPONINI 1.14*  --  2.77*   < >  --    < >  --  1.82* 1.62*  --  1.08*  --    < > = values in this interval not displayed.    Estimated Creatinine Clearance: 18.5 mL/min (A) (by C-G formula based on SCr of 4.21 mg/dL (H)).  Assessment: [name] on Heparin for r/o PE - now s/p 2 PE[facility] arrests with [facility].  Noted Bilateral lower extremities are positive for acute deep vein thrombosis involving bilateral posterior tibial and peroneal veins.  Heparin level is therapeutic at 1700 u/hr with level of 0.31.  Hgb stable . RN reports heparin has been infusing appropriately. RN reports bleeding from ostomy and rectum, stable from prior day. Per MD, aim for lower end of heparin goal for continued bleeding.   Goal of Therapy:  Heparin level 0.3-0.7 units/ml Monitor platelets by anticoagulation protocol:  Yes   Plan:  Continue heparin at 1700 units/hr Check HL @ 1800 Daily HL, CBC Monitor bleeding   Anselm PancoastAmy Sims Laday, PharmD Candidate 09/13/2017 10:21 AM

## 2017-09-13 NOTE — Consult Note (Signed)
[facility] Renal Consultation Note  Requesting MD: Pearline Cables Indication for Consultation: AKI  HPI:  Caleb Singh is a 62 y.o. male with really not much past medical history other than smoking-he is status post hospitalization in December 2018 for perforated viscus-status post OR with ileostomy complicated by acute kidney injury requiring transient CRRT.  Creatinine improved to 2 around the time of discharge on 1/11.  Patient was in rehab and reportedly not doing well-having high output from his ostomy and presented with shortness of breath-was being worked up for shortness of breath when in for V/Q developed PEA arrest-requiring intubation and pressors.  He is now off pressors and is extubated.  Creatinine was 2.76 when he presented but it has steadily worsened and urine output has been low at best 200 cc per 24 hours.  He is sitting in a bedside chair and looks pretty good.  He does not appear to be uremic but is volume overloaded-chest x-ray shows such and he is on 3-4 L nasal cannula  Creatinine, Ser  Date/Time Value Ref Range Status  09/13/2017 04:00 AM 4.21 (H) 0.61 - 1.24 mg/dL Final  09/12/2017 02:38 PM 4.00 (H) 0.61 - 1.24 mg/dL Final  09/12/2017 04:04 AM 3.71 (H) 0.61 - 1.24 mg/dL Final  09/11/2017 05:41 AM 3.30 (H) 0.61 - 1.24 mg/dL Final  09/11/2017 12:42 AM 2.99 (H) 0.61 - 1.24 mg/dL Final  09/10/2017 11:23 AM 2.76 (H) 0.61 - 1.24 mg/dL Final  08/30/2017 05:20 AM 2.04 (H) 0.61 - 1.24 mg/dL Final  08/29/2017 09:16 PM 1.97 (H) 0.61 - 1.24 mg/dL Final  08/29/2017 04:37 AM 2.23 (H) 0.61 - 1.24 mg/dL Final  08/28/2017 04:28 AM 2.09 (H) 0.61 - 1.24 mg/dL Final  08/26/2017 03:39 AM 1.91 (H) 0.61 - 1.24 mg/dL Final  08/25/2017 03:40 AM 2.09 (H) 0.61 - 1.24 mg/dL Final  08/24/2017 04:37 AM 2.12 (H) 0.61 - 1.24 mg/dL Final  08/23/2017 04:01 AM 2.06 (H) 0.61 - 1.24 mg/dL Final  08/22/2017 04:00 AM 1.97 (H) 0.61 - 1.24 mg/dL Final  08/20/2017 05:43 AM 1.87 (H) 0.61 - 1.24  mg/dL Final  08/19/2017 08:05 AM 2.00 (H) 0.61 - 1.24 mg/dL Final  08/18/2017 04:01 AM 2.08 (H) 0.61 - 1.24 mg/dL Final  08/16/2017 03:40 AM 1.98 (H) 0.61 - 1.24 mg/dL Final  08/15/2017 08:02 AM 2.08 (H) 0.61 - 1.24 mg/dL Final  08/14/2017 04:18 AM 2.21 (H) 0.61 - 1.24 mg/dL Final  08/13/2017 02:30 PM 2.31 (H) 0.61 - 1.24 mg/dL Final  08/13/2017 10:55 AM 2.27 (H) 0.61 - 1.24 mg/dL Final  08/13/2017 05:00 AM 2.40 (H) 0.61 - 1.24 mg/dL Final  08/12/2017 10:30 AM 2.41 (H) 0.61 - 1.24 mg/dL Final  08/12/2017 03:54 AM 2.26 (H) 0.61 - 1.24 mg/dL Final  08/11/2017 03:37 AM 2.41 (H) 0.61 - 1.24 mg/dL Final  08/10/2017 04:15 AM 2.53 (H) 0.61 - 1.24 mg/dL Final  08/09/2017 05:00 AM 2.70 (H) 0.61 - 1.24 mg/dL Final  08/08/2017 01:45 AM 2.27 (H) 0.61 - 1.24 mg/dL Final  08/07/2017 10:11 PM 2.05 (H) 0.61 - 1.24 mg/dL Final  08/07/2017 05:00 AM 2.24 (H) 0.61 - 1.24 mg/dL Final  08/06/2017 05:10 AM 2.08 (H) 0.61 - 1.24 mg/dL Final  08/05/2017 06:20 AM 1.65 (H) 0.61 - 1.24 mg/dL Final  08/04/2017 03:23 AM 1.75 (H) 0.61 - 1.24 mg/dL Final  08/03/2017 11:40 AM 1.87 (H) 0.61 - 1.24 mg/dL Final  08/02/2017 05:02 AM 2.50 (H) 0.61 - 1.24 mg/dL Final  08/01/2017 06:29 PM 2.63 (H) 0.61 -  1.24 mg/dL Final  08/01/2017 03:34 AM 2.95 (H) 0.61 - 1.24 mg/dL Final  07/31/2017 07:45 AM 3.14 (H) 0.61 - 1.24 mg/dL Final  07/30/2017 04:25 AM 3.70 (H) 0.61 - 1.24 mg/dL Final  07/29/2017 04:57 AM 2.98 (H) 0.61 - 1.24 mg/dL Final  07/28/2017 05:00 AM 2.44 (H) 0.61 - 1.24 mg/dL Final  07/28/2017 05:00 AM 2.38 (H) 0.61 - 1.24 mg/dL Final  07/28/2017 05:00 AM 2.43 (H) 0.61 - 1.24 mg/dL Final  07/27/2017 04:30 PM 2.19 (H) 0.61 - 1.24 mg/dL Final  07/27/2017 05:00 AM 2.68 (H) 0.61 - 1.24 mg/dL Final  07/27/2017 05:00 AM 2.71 (H) 0.61 - 1.24 mg/dL Final  07/26/2017 04:08 PM 2.69 (H) 0.61 - 1.24 mg/dL Final  07/26/2017 04:30 AM 2.85 (H) 0.61 - 1.24 mg/dL Final  07/25/2017 08:12 PM 2.64 (H) 0.61 - 1.24 mg/dL Final  07/25/2017  03:49 PM 2.59 (H) 0.61 - 1.24 mg/dL Final     PMHx:   Past Medical History:  Diagnosis Date  . Bowel obstruction ([place])   . Pneumothorax     Past Surgical History:  Procedure Laterality Date  . COLOSTOMY    . I&D EXTREMITY Right 04/17/2016   Procedure: IRRIGATION AND DEBRIDEMENT RIGHT HAND;  Surgeon: Roseanne Kaufman, MD;  Location: [place];  Service: Orthopedics;  Laterality: Right;  . I&D EXTREMITY Right 04/19/2016   Procedure: REPEAT I&D RIGHT HAND;  Surgeon: Roseanne Kaufman, MD;  Location: [place];  Service: Orthopedics;  Laterality: Right;  . LAPAROTOMY N/A 07/23/2017   Procedure: EXPLORATORY LAPAROTOMY, SMALL BOWEL RESECTION;  Surgeon: Greer Pickerel, MD;  Location: [facility];  Service: General;  Laterality: N/A;  . LAPAROTOMY N/A 07/25/2017   Procedure: EXPLORATORY LAPAROSCOPY WITH ILEOCECTOMY, END ILEOSTOMY;  Surgeon: [name], MD;  Location: [facility];  Service: General;  Laterality: N/A;  PATIENT ABDOMINAL WOUND LEFT OPEN AND PACKED WITH BULKY DRESSING  . LAPAROTOMY N/A 07/31/2017   Procedure: EXPLORATORY LAPAROTOMY drainage of abdominal abcess;  Surgeon: Excell Seltzer, MD;  Location: [facility];  Service: General;  Laterality: N/A;  . LEFT HEART CATH AND CORONARY ANGIOGRAPHY N/A 08/13/2017   Procedure: LEFT HEART CATH AND CORONARY ANGIOGRAPHY;  Surgeon: Jolaine Artist, MD;  Location: [place] [facility];  Service: Cardiovascular;  Laterality: N/A;    Family Hx: No family history on file.  Social History:  reports that he has quit smoking. he has never used smokeless tobacco. He reports that he drinks alcohol. He reports that he does not use drugs.  Allergies: No Known Allergies  Medications: Prior to Admission medications   Medication Sig Start Date End Date Taking? Authorizing Provider  albuterol (PROVENTIL HFA;VENTOLIN HFA) 108 (90 Base) MCG/ACT inhaler Inhale 2 puffs into the lungs every 6 (six) hours as needed for wheezing or shortness of breath.   Yes [provider]  amLODipine (NORVASC) 5 MG tablet Take 1 tablet (5 mg total) by mouth daily. 09/01/17  Yes Hall, Lorenda Cahill, DO  feeding supplement, ENSURE ENLIVE, (ENSURE ENLIVE) LIQD Take 237 mLs by mouth 3 (three) times daily between meals. 08/31/17  Yes Hall, Carole N, DO  fluticasone furoate-vilanterol (BREO ELLIPTA) 100-25 MCG/INH AEPB Inhale 1 puff into the lungs daily.   Yes [provider]  hydrALAZINE (APRESOLINE) 25 MG tablet Take 1 tablet (25 mg total) by mouth every 8 (eight) hours. 08/31/17  Yes Kayleen Memos, DO  loperamide (IMODIUM) 2 MG capsule Take 1 capsule (2 mg total) by mouth every 8 (eight) hours. 08/31/17  Yes [place] Crane,  Carole N, DO  psyllium (HYDROCIL/METAMUCIL) 95 % PACK Take 1 packet by mouth daily. 09/01/17  Yes Kayleen Memos, DO    I have reviewed the patient's current medications.  Labs:  Results for orders placed or performed during the hospital encounter of 09/10/17 (from the past 48 hour(s))  APTT     Status: Abnormal   Collection Time: 09/11/17 12:09 PM  Result Value Ref Range   aPTT 56 (H) 24 - 36 seconds    Comment:        IF BASELINE aPTT IS ELEVATED, SUGGEST PATIENT RISK ASSESSMENT BE USED TO DETERMINE APPROPRIATE ANTICOAGULANT THERAPY.   Troponin I (q 6hr x 3)     Status: Abnormal   Collection Time: 09/11/17  2:39 PM  Result Value Ref Range   Troponin I 5.18 (HH) <0.03 ng/mL    Comment: CRITICAL VALUE NOTED.  VALUE IS CONSISTENT WITH PREVIOUSLY REPORTED AND CALLED VALUE.  CBC     Status: Abnormal   Collection Time: 09/11/17  2:39 PM  Result Value Ref Range   WBC 20.4 (H) 4.0 - 10.5 K/uL   RBC 2.80 (L) 4.22 - 5.81 MIL/uL   Hemoglobin 8.0 (L) 13.0 - 17.0 g/dL   HCT 23.5 (L) 39.0 - 52.0 %   MCV 83.9 78.0 - 100.0 fL   MCH 28.6 26.0 - 34.0 pg   MCHC 34.0 30.0 - 36.0 g/dL   RDW 14.4 11.5 - 15.5 %   Platelets 200 150 - 400 K/uL  Lactic acid, plasma     Status: Abnormal   Collection Time: 09/11/17  2:39 PM  Result Value Ref Range   Lactic Acid, Venous 4.7  (HH) 0.5 - 1.9 mmol/L    Comment: CRITICAL RESULT CALLED TO, READ BACK BY AND VERIFIED WITHBernardo Heater RN [phone] GREEN R   Glucose, capillary     Status: Abnormal   Collection Time: 09/11/17  3:43 PM  Result Value Ref Range   Glucose-Capillary 105 (H) 65 - 99 mg/dL   Comment 1 Capillary Specimen    Comment 2 Notify RN   Glucose, capillary     Status: Abnormal   Collection Time: 09/11/17  7:42 PM  Result Value Ref Range   Glucose-Capillary 122 (H) 65 - 99 mg/dL   Comment 1 Capillary Specimen    Comment 2 Notify RN   Heparin level (unfractionated)     Status: Abnormal   Collection Time: 09/11/17  7:44 PM  Result Value Ref Range   Heparin Unfractionated <0.10 (L) 0.30 - 0.70 IU/mL    Comment:        IF HEPARIN RESULTS ARE BELOW EXPECTED VALUES, AND PATIENT DOSAGE HAS BEEN CONFIRMED, SUGGEST FOLLOW UP TESTING OF ANTITHROMBIN III LEVELS.   CBC     Status: Abnormal   Collection Time: 09/11/17  9:54 PM  Result Value Ref Range   WBC 15.9 (H) 4.0 - 10.5 K/uL   RBC 2.56 (L) 4.22 - 5.81 MIL/uL   Hemoglobin 7.4 (L) 13.0 - 17.0 g/dL   HCT 21.6 (L) 39.0 - 52.0 %   MCV 84.4 78.0 - 100.0 fL   MCH 28.9 26.0 - 34.0 pg   MCHC 34.3 30.0 - 36.0 g/dL   RDW 14.5 11.5 - 15.5 %   Platelets 184 150 - 400 K/uL  Troponin I (q 6hr x 3)     Status: Abnormal   Collection Time: 09/11/17  9:55 PM  Result Value Ref Range   Troponin I 4.42 (HH) <0.03  ng/mL    Comment: CRITICAL VALUE NOTED.  VALUE IS CONSISTENT WITH PREVIOUSLY REPORTED AND CALLED VALUE.  Urine culture     Status: None   Collection Time: 09/12/17 12:48 AM  Result Value Ref Range   Specimen Description URINE, RANDOM    Special Requests NONE    Culture NO GROWTH    Report Status 09/13/2017 FINAL   Glucose, capillary     Status: Abnormal   Collection Time: 09/12/17  3:55 AM  Result Value Ref Range   Glucose-Capillary 131 (H) 65 - 99 mg/dL   Comment 1 Capillary Specimen    Comment 2 Notify RN   Procalcitonin     Status: None    Collection Time: 09/12/17  4:04 AM  Result Value Ref Range   Procalcitonin 50.13 ng/mL    Comment:        Interpretation: PCT >= 10 ng/mL: Important systemic inflammatory response, almost exclusively due to severe bacterial sepsis or septic shock. (NOTE)       Sepsis PCT Algorithm           Lower Respiratory Tract                                      Infection PCT Algorithm    ----------------------------     ----------------------------         PCT < 0.25 ng/mL                PCT < 0.10 ng/mL         Strongly encourage             Strongly discourage   discontinuation of antibiotics    initiation of antibiotics    ----------------------------     -----------------------------       PCT 0.25 - 0.50 ng/mL            PCT 0.10 - 0.25 ng/mL               OR       >80% decrease in PCT            Discourage initiation of                                            antibiotics      Encourage discontinuation           of antibiotics    ----------------------------     -----------------------------         PCT >= 0.50 ng/mL              PCT 0.26 - 0.50 ng/mL                AND       <80% decrease in PCT             Encourage initiation of                                             antibiotics       Encourage continuation           of antibiotics    ----------------------------     -----------------------------  PCT >= 0.50 ng/mL                  PCT > 0.50 ng/mL               AND         increase in PCT                  Strongly encourage                                      initiation of antibiotics    Strongly encourage escalation           of antibiotics                                     -----------------------------                                           PCT <= 0.25 ng/mL                                                 OR                                        > 80% decrease in PCT                                     Discontinue / Do not initiate                                              antibiotics   Basic metabolic panel     Status: Abnormal   Collection Time: 09/12/17  4:04 AM  Result Value Ref Range   Sodium 137 135 - 145 mmol/L   Potassium 3.7 3.5 - 5.1 mmol/L   Chloride 91 (L) 101 - 111 mmol/L   CO2 29 22 - 32 mmol/L   Glucose, Bld 120 (H) 65 - 99 mg/dL   BUN 54 (H) 6 - 20 mg/dL   Creatinine, Ser 3.71 (H) 0.61 - 1.24 mg/dL   Calcium 6.4 (LL) 8.9 - 10.3 mg/dL    Comment: CRITICAL RESULT CALLED TO, READ BACK BY AND VERIFIED WITH: B CUMMINGS,RN [phone] WILDERK    GFR calc non Af Amer 16 (L) >60 mL/min   GFR calc Af Amer 19 (L) >60 mL/min    Comment: (NOTE) The eGFR has been calculated using the CKD EPI equation. This calculation has not been validated in all clinical situations. eGFR's persistently <60 mL/min signify possible Chronic Kidney Disease.    Anion gap 17 (H) 5 - 15  Magnesium     Status: None   Collection Time: 09/12/17  4:04 AM  Result Value Ref Range   Magnesium 1.7 1.7 - 2.4 mg/dL  Phosphorus     Status: Abnormal   Collection Time: 09/12/17  4:04 AM  Result Value Ref Range   Phosphorus 5.4 (H) 2.5 - 4.6 mg/dL  Heparin level (unfractionated)     Status: Abnormal   Collection Time: 09/12/17  4:04 AM  Result Value Ref Range   Heparin Unfractionated <0.10 (L) 0.30 - 0.70 IU/mL    Comment:        IF HEPARIN RESULTS ARE BELOW EXPECTED VALUES, AND PATIENT DOSAGE HAS BEEN CONFIRMED, SUGGEST FOLLOW UP TESTING OF ANTITHROMBIN III LEVELS.   CBC     Status: Abnormal   Collection Time: 09/12/17  4:04 AM  Result Value Ref Range   WBC 14.5 (H) 4.0 - 10.5 K/uL    Comment: REPEATED TO VERIFY   RBC 2.31 (L) 4.22 - 5.81 MIL/uL   Hemoglobin 6.7 (LL) 13.0 - 17.0 g/dL    Comment: RESULTS VERIFIED VIA RECOLLECT REPEATED TO VERIFY CRITICAL RESULT CALLED TO, READ BACK BY AND VERIFIED WITH: Willis Modena, RN [phone] 09/12/2017 BY MACEDA, J.     HCT 19.8 (L) 39.0 - 52.0 %   MCV 85.7 78.0 - 100.0 fL   MCH 29.0 26.0 - 34.0 pg   MCHC  33.8 30.0 - 36.0 g/dL   RDW 15.0 11.5 - 15.5 %   Platelets 183 150 - 400 K/uL  Hepatic function panel     Status: Abnormal   Collection Time: 09/12/17  4:04 AM  Result Value Ref Range   Total Protein 4.7 (L) 6.5 - 8.1 g/dL   Albumin 1.5 (L) 3.5 - 5.0 g/dL   AST 4,320 (H) 15 - 41 U/L    Comment: RESULTS CONFIRMED BY MANUAL DILUTION   ALT 2,507 (H) 17 - 63 U/L   Alkaline Phosphatase 170 (H) 38 - 126 U/L   Total Bilirubin 1.2 0.3 - 1.2 mg/dL   Bilirubin, Direct 0.4 0.1 - 0.5 mg/dL   Indirect Bilirubin 0.8 0.3 - 0.9 mg/dL  Prepare RBC     Status: None   Collection Time: 09/12/17  5:56 AM  Result Value Ref Range   Order Confirmation ORDER PROCESSED BY BLOOD BANK   Glucose, capillary     Status: Abnormal   Collection Time: 09/12/17  7:41 AM  Result Value Ref Range   Glucose-Capillary 114 (H) 65 - 99 mg/dL   Comment 1 Capillary Specimen    Comment 2 Notify RN   Glucose, capillary     Status: Abnormal   Collection Time: 09/12/17 11:42 AM  Result Value Ref Range   Glucose-Capillary 117 (H) 65 - 99 mg/dL   Comment 1 Capillary Specimen    Comment 2 Notify RN   CBC     Status: Abnormal   Collection Time: 09/12/17 11:58 AM  Result Value Ref Range   WBC 14.4 (H) 4.0 - 10.5 K/uL   RBC 2.69 (L) 4.22 - 5.81 MIL/uL   Hemoglobin 8.0 (L) 13.0 - 17.0 g/dL   HCT 23.4 (L) 39.0 - 52.0 %   MCV 87.0 78.0 - 100.0 fL   MCH 29.7 26.0 - 34.0 pg   MCHC 34.2 30.0 - 36.0 g/dL   RDW 14.8 11.5 - 15.5 %   Platelets 156 150 - 400 K/uL  Heparin level (unfractionated)     Status: Abnormal   Collection Time: 09/12/17 11:58 AM  Result Value Ref Range   Heparin Unfractionated 0.17 (L) 0.30 - 0.70 IU/mL    Comment:        IF HEPARIN RESULTS  ARE BELOW EXPECTED VALUES, AND PATIENT DOSAGE HAS BEEN CONFIRMED, SUGGEST FOLLOW UP TESTING OF ANTITHROMBIN III LEVELS.   Lactic acid, plasma     Status: None   Collection Time: 09/12/17 11:58 AM  Result Value Ref Range   Lactic Acid, Venous 1.9 0.5 - 1.9 mmol/L   Troponin I (q 6hr x 3)     Status: Abnormal   Collection Time: 09/12/17 11:58 AM  Result Value Ref Range   Troponin I 1.82 (HH) <0.03 ng/mL    Comment: CRITICAL VALUE NOTED.  VALUE IS CONSISTENT WITH PREVIOUSLY REPORTED AND CALLED VALUE.  CBC     Status: Abnormal   Collection Time: 09/12/17  2:38 PM  Result Value Ref Range   WBC 14.7 (H) 4.0 - 10.5 K/uL   RBC 2.77 (L) 4.22 - 5.81 MIL/uL   Hemoglobin 8.1 (L) 13.0 - 17.0 g/dL   HCT 23.9 (L) 39.0 - 52.0 %   MCV 86.3 78.0 - 100.0 fL   MCH 29.2 26.0 - 34.0 pg   MCHC 33.9 30.0 - 36.0 g/dL   RDW 14.9 11.5 - 15.5 %   Platelets 158 150 - 400 K/uL  Troponin I (q 6hr x 3)     Status: Abnormal   Collection Time: 09/12/17  2:38 PM  Result Value Ref Range   Troponin I 1.62 (HH) <0.03 ng/mL    Comment: CRITICAL VALUE NOTED.  VALUE IS CONSISTENT WITH PREVIOUSLY REPORTED AND CALLED VALUE.  Basic metabolic panel     Status: Abnormal   Collection Time: 09/12/17  2:38 PM  Result Value Ref Range   Sodium 139 135 - 145 mmol/L   Potassium 3.8 3.5 - 5.1 mmol/L   Chloride 91 (L) 101 - 111 mmol/L   CO2 31 22 - 32 mmol/L   Glucose, Bld 106 (H) 65 - 99 mg/dL   BUN 47 (H) 6 - 20 mg/dL   Creatinine, Ser 4.00 (H) 0.61 - 1.24 mg/dL   Calcium 6.4 (LL) 8.9 - 10.3 mg/dL    Comment: CRITICAL RESULT CALLED TO, READ BACK BY AND VERIFIED WITH: R PATERNOSTER,RN [phone] 09/12/2017 WBOND    GFR calc non Af Amer 15 (L) >60 mL/min   GFR calc Af Amer 17 (L) >60 mL/min    Comment: (NOTE) The eGFR has been calculated using the CKD EPI equation. This calculation has not been validated in all clinical situations. eGFR's persistently <60 mL/min signify possible Chronic Kidney Disease.    Anion gap 17 (H) 5 - 15  MRSA PCR Screening     Status: None   Collection Time: 09/12/17  3:29 PM  Result Value Ref Range   MRSA by PCR NEGATIVE NEGATIVE    Comment:        The GeneXpert MRSA Assay (FDA approved for NASAL specimens only), is one component of a comprehensive MRSA  colonization surveillance program. It is not intended to diagnose MRSA infection nor to guide or monitor treatment for MRSA infections.   Glucose, capillary     Status: Abnormal   Collection Time: 09/12/17  3:29 PM  Result Value Ref Range   Glucose-Capillary 115 (H) 65 - 99 mg/dL   Comment 1 Capillary Specimen    Comment 2 Notify RN   Glucose, capillary     Status: None   Collection Time: 09/12/17  7:44 PM  Result Value Ref Range   Glucose-Capillary 94 65 - 99 mg/dL   Comment 1 Capillary Specimen    Comment 2 Notify RN   I-STAT  7, (LYTES, BLD GAS, ICA, H+H)     Status: Abnormal   Collection Time: 09/12/17  8:30 PM  Result Value Ref Range   pH, Arterial 7.486 (H) 7.350 - 7.450   pCO2 arterial 48.3 (H) 32.0 - 48.0 mmHg   pO2, Arterial 105.0 83.0 - 108.0 mmHg   Bicarbonate 36.6 (H) 20.0 - 28.0 mmol/L   TCO2 38 (H) 22 - 32 mmol/L   O2 Saturation 98.0 %   Acid-Base Excess 12.0 (H) 0.0 - 2.0 mmol/L   Sodium 137 135 - 145 mmol/L   Potassium 4.0 3.5 - 5.1 mmol/L   Calcium, Ion 0.90 (L) 1.15 - 1.40 mmol/L   HCT 26.0 (L) 39.0 - 52.0 %   Hemoglobin 8.8 (L) 13.0 - 17.0 g/dL   Patient temperature 97.9 F    Collection site IV START    Drawn by Operator    Sample type CARDIOPULMONARY BYPASS   Troponin I (q 6hr x 3)     Status: Abnormal   Collection Time: 09/12/17 10:01 PM  Result Value Ref Range   Troponin I 1.08 (HH) <0.03 ng/mL    Comment: CRITICAL VALUE NOTED.  VALUE IS CONSISTENT WITH PREVIOUSLY REPORTED AND CALLED VALUE.  Heparin level (unfractionated)     Status: None   Collection Time: 09/12/17 10:01 PM  Result Value Ref Range   Heparin Unfractionated 0.32 0.30 - 0.70 IU/mL    Comment:        IF HEPARIN RESULTS ARE BELOW EXPECTED VALUES, AND PATIENT DOSAGE HAS BEEN CONFIRMED, SUGGEST FOLLOW UP TESTING OF ANTITHROMBIN III LEVELS.   Glucose, capillary     Status: None   Collection Time: 09/12/17 11:42 PM  Result Value Ref Range   Glucose-Capillary 90 65 - 99 mg/dL    Comment 1 Capillary Specimen    Comment 2 Notify RN   Glucose, capillary     Status: None   Collection Time: 09/13/17  3:24 AM  Result Value Ref Range   Glucose-Capillary 85 65 - 99 mg/dL   Comment 1 Notify RN   Heparin level (unfractionated)     Status: None   Collection Time: 09/13/17  4:00 AM  Result Value Ref Range   Heparin Unfractionated 0.31 0.30 - 0.70 IU/mL    Comment:        IF HEPARIN RESULTS ARE BELOW EXPECTED VALUES, AND PATIENT DOSAGE HAS BEEN CONFIRMED, SUGGEST FOLLOW UP TESTING OF ANTITHROMBIN III LEVELS.   CBC     Status: Abnormal   Collection Time: 09/13/17  4:00 AM  Result Value Ref Range   WBC 15.8 (H) 4.0 - 10.5 K/uL   RBC 2.90 (L) 4.22 - 5.81 MIL/uL   Hemoglobin 8.4 (L) 13.0 - 17.0 g/dL   HCT 25.3 (L) 39.0 - 52.0 %   MCV 87.2 78.0 - 100.0 fL   MCH 29.0 26.0 - 34.0 pg   MCHC 33.2 30.0 - 36.0 g/dL   RDW 15.1 11.5 - 15.5 %   Platelets 168 150 - 400 K/uL  Comprehensive metabolic panel     Status: Abnormal   Collection Time: 09/13/17  4:00 AM  Result Value Ref Range   Sodium 138 135 - 145 mmol/L   Potassium 3.7 3.5 - 5.1 mmol/L   Chloride 92 (L) 101 - 111 mmol/L   CO2 30 22 - 32 mmol/L   Glucose, Bld 93 65 - 99 mg/dL   BUN 54 (H) 6 - 20 mg/dL   Creatinine, Ser 4.21 (H) 0.61 - 1.24 mg/dL  Calcium 6.6 (L) 8.9 - 10.3 mg/dL   Total Protein 5.2 (L) 6.5 - 8.1 g/dL   Albumin 1.7 (L) 3.5 - 5.0 g/dL   AST 1,411 (H) 15 - 41 U/L   ALT 1,701 (H) 17 - 63 U/L   Alkaline Phosphatase 185 (H) 38 - 126 U/L   Total Bilirubin 1.4 (H) 0.3 - 1.2 mg/dL   GFR calc non Af Amer 14 (L) >60 mL/min   GFR calc Af Amer 16 (L) >60 mL/min    Comment: (NOTE) The eGFR has been calculated using the CKD EPI equation. This calculation has not been validated in all clinical situations. eGFR's persistently <60 mL/min signify possible Chronic Kidney Disease.    Anion gap 16 (H) 5 - 15  Fibrinogen     Status: None   Collection Time: 09/13/17  4:00 AM  Result Value Ref Range    Fibrinogen 241 210 - 475 mg/dL  Magnesium     Status: None   Collection Time: 09/13/17  4:00 AM  Result Value Ref Range   Magnesium 1.7 1.7 - 2.4 mg/dL  Glucose, capillary     Status: None   Collection Time: 09/13/17  7:43 AM  Result Value Ref Range   Glucose-Capillary 87 65 - 99 mg/dL   Comment 1 Capillary Specimen   Glucose, capillary     Status: None   Collection Time: 09/13/17 11:38 AM  Result Value Ref Range   Glucose-Capillary 93 65 - 99 mg/dL   Comment 1 Capillary Specimen      ROS:  A comprehensive review of systems was negative except for: Respiratory: positive for dyspnea on exertion Gastrointestinal: positive for abdominal pain and High output ostomy Edema  Physical Exam: Vitals:   09/13/17 1000 09/13/17 1100  BP: 106/67 108/69  Pulse: 99 98  Resp: (!) 28 20  Temp:    SpO2: 98% 100%     General: Chronically ill-appearing black male sitting up in a bedside chair.  He has edema in his extremities but is breathing comfortably on nasal cannula oxygen HEENT: Pupils are equal round reactive to light symmetrical motions are intact, mucous members are moist Neck: Minimal JVD Heart: Regular rate and rhythm Lungs: Very poor air movement bilaterally -consistent with years of tobacco use  Abdomen: Ostomy in place, nontender Extremities: Pitting edema in all extremities Skin: Warm and dry Neuro: Alert nonfocal  Assessment/Plan: 61 year old black male with previous acute kidney injury requiring dialysis in December 2018.  He now presents with worsening renal function in the setting of PEA arrest with prolonged hypotension requiring pressors 1.Renal-acute on chronic kidney injury.  Creatinine was noted to be 1.1 in September 2017.  After AK I in December it appears his creatinine nadir was around 2.  Now worsened in the setting of above.  There are no absolute indications for dialysis.  Since is making urine and is fluid overloaded I will try to augment urine output with  Lasix.  It is entirely possible he may become dialysis requiring again and I have told him this 2. Hypertension/volume  -hemodynamically improved, volume overloaded.  11 L positive this admission.  Peripheral edema.  Will challenge with Lasix-stop fluids 3.  Electrolytes-   corrected calcium is okay.  Potassium normal 4. Anemia  -has been low.  Transfuse as needed.  Check iron stores and add ESA   Steen Bisig A 09/13/2017, 12:00 PM

## 2017-09-13 NOTE — Progress Notes (Signed)
PT Cancellation Note  Patient Details Name: Caleb Singh MRN: [id number] DOB: [birth year]   Cancelled Treatment:    Reason Eval/Treat Not Completed: Medical issues which prohibited therapy. Pt with femoral line in place. PT will continue to f/u with pt as available and appropriate.    Alessandra BevelsJennifer M Dredyn Gubbels 09/13/2017, 9:16 AM

## 2017-09-13 NOTE — Progress Notes (Signed)
Pt had 7 beat run V tach. Dr. Sylvie FarrierA. Wallace notified, morning labs ordered.

## 2017-09-13 NOTE — Evaluation (Signed)
Clinical/Bedside Swallow Evaluation Patient Details  Name: Caleb Singh MRN: [id number] Date of Birth: 22-Mar-1956  Today's Date: 09/13/2017 Time: SLP Start Time (ACUTE ONLY): 0915 SLP Stop Time (ACUTE ONLY): 0938 SLP Time Calculation (min) (ACUTE ONLY): 23 min  Past Medical History:  Past Medical History:  Diagnosis Date  . Bowel obstruction (HCC)   . Pneumothorax    Past Surgical History:  Past Surgical History:  Procedure Laterality Date  . COLOSTOMY    . I&D EXTREMITY Right 04/17/2016   Procedure: IRRIGATION AND DEBRIDEMENT RIGHT HAND;  Surgeon: Dominica Severin, MD;  Location: [facility] OR;  Service: Orthopedics;  Laterality: Right;  . I&D EXTREMITY Right 04/19/2016   Procedure: REPEAT I&D RIGHT HAND;  Surgeon: Dominica Severin, MD;  Location: [facility] OR;  Service: Orthopedics;  Laterality: Right;  . LAPAROTOMY N/A 07/23/2017   Procedure: EXPLORATORY LAPAROTOMY, SMALL BOWEL RESECTION;  Surgeon: Gaynelle Adu, MD;  Location: [facility] ORS;  Service: General;  Laterality: N/A;  . LAPAROTOMY N/A 07/25/2017   Procedure: EXPLORATORY LAPAROSCOPY WITH ILEOCECTOMY, END ILEOSTOMY;  Surgeon: Almond Lint, MD;  Location: [facility] ORS;  Service: General;  Laterality: N/A;  PATIENT ABDOMINAL WOUND LEFT OPEN AND PACKED WITH BULKY DRESSING  . LAPAROTOMY N/A 07/31/2017   Procedure: EXPLORATORY LAPAROTOMY drainage of abdominal abcess;  Surgeon: Glenna Fellows, MD;  Location: [facility] ORS;  Service: General;  Laterality: N/A;  . LEFT HEART CATH AND CORONARY ANGIOGRAPHY N/A 08/13/2017   Procedure: LEFT HEART CATH AND CORONARY ANGIOGRAPHY;  Surgeon: Dolores Patty, MD;  Location: [facility] INVASIVE CV LAB;  Service: Cardiovascular;  Laterality: N/A;   HPI:  Pt is a 62 y.o.malewith PMH of PE, tobacco abuse, and recent hospitalization for small bowel perforation from 07/23/2017-08/31/2017 complicated by multisystem failure. Discharged to a SNF 08/31/17, however returned 09/10/17 with DOE and chest tightness, with subsequent respiratory  failure from presumed massive PE, with PEA arrest x2 (1/22). Pt intubated 1/22 to 1/24. Failed post-extubation screen. CXR (1/24) revealing interval increase in bibasilar airspace disease and associated effusions. While this may represent atelectasis, infection is not excluded. Findings are worse on the right.    Assessment / Plan / Recommendation Clinical Impression   Pt presents with suspected pharyngeal dysphagia secondary to swelling/irritation post extubation. Pt has mildly weak voice, intermittent voice breaks, and congestive, strong cough at baseline. Per pt, he has had no previous swallowing difficulty. Pt initially consumed single ice chips without overt signs of aspiration; however, pt had immediate coughing and slight throat clearing following larger sips of thin liquid via straw and later ice chip trials. Throughout trials, pt benefited from min verbal cues to take small sips. Recommend alternative means of nutrition and medications and few ice chips PRN after oral care. Good prognosis for return to POs with increased time after extubation. SLP will follow up to determine readiness to start diet.   SLP Visit Diagnosis: Dysphagia, pharyngeal phase (R13.13)    Aspiration Risk  Moderate aspiration risk    Diet Recommendation NPO;Ice chips PRN after oral care   Medication Administration: Via alternative means Supervision: Full supervision/cueing for compensatory strategies Postural Changes: Seated upright at 90 degrees    Other  Recommendations Oral Care Recommendations: Oral care QID   Follow up Recommendations Skilled Nursing facility      Frequency and Duration min 2x/week  2 weeks       Prognosis Prognosis for Safe Diet Advancement: Good Barriers to Reach Goals: Cognitive deficits      Swallow Study   General HPI: Pt is  a 62 y.o.malewith PMH of PE, tobacco abuse, and recent hospitalization for small bowel perforation from 07/23/2017-08/31/2017 complicated by multisystem  failure. Discharged to a SNF 08/31/17, however returned 09/10/17 with DOE and chest tightness, with subsequent respiratory failure from presumed massive PE, with PEA arrest x2 (1/22). Pt intubated 1/22 to 1/24. Failed post-extubation screen. CXR (1/24) revealing interval increase in bibasilar airspace disease and associated effusions. While this may represent atelectasis, infection is not excluded. Findings are worse on the right.  Type of Study: Bedside Swallow Evaluation Diet Prior to this Study: NPO Temperature Spikes Noted: No Respiratory Status: Nasal cannula History of Recent Intubation: Yes Length of Intubations (days): 3 days Date extubated: 09/12/17 Behavior/Cognition: Alert;Cooperative Oral Cavity Assessment: Within Functional Limits Oral Care Completed by SLP: No Oral Cavity - Dentition: Adequate natural dentition Vision: Functional for self-feeding Self-Feeding Abilities: Needs assist;Able to feed self Patient Positioning: Upright in bed Baseline Vocal Quality: Hoarse(Intermittent voice breaks) Volitional Cough: Strong Volitional Swallow: Able to elicit    Oral/Motor/Sensory Function Overall Oral Motor/Sensory Function: Within functional limits   Ice Chips Ice chips: Impaired Presentation: Spoon Pharyngeal Phase Impairments: Cough - Delayed;Throat Clearing - Immediate   Thin Liquid Thin Liquid: Impaired Presentation: Straw Pharyngeal  Phase Impairments: Throat Clearing - Immediate;Cough - Immediate;Change in Vital Signs    Nectar Thick Nectar Thick Liquid: Not tested   Honey Thick Honey Thick Liquid: Not tested   Puree Puree: Not tested   Solid   GO   Caleb Singh SLP Student Clinician  Solid: Not tested        Caleb Singh 09/13/2017,9:59 AM

## 2017-09-13 NOTE — Progress Notes (Signed)
PULMONARY / CRITICAL CARE MEDICINE   Name: Caleb LaundryFrederick Misner MRN: [id number] DOB: [birth year]    ADMISSION DATE:  09/10/2017 CONSULTATION DATE:  1/22  REFERRING MD:  EDP  CHIEF COMPLAINT:  Dyspnea   HISTORY OF PRESENT ILLNESS:   62 year old male smoker (40 pack years) with hx PE, who suffered from a perforated small bowel on December 24 for which he underwent small bowel resection and subsequently suffered from multisystem failure and required CRRT. Course was c/b E Coli bacteremia, respiratory failure, brief arrest, and intra-abdominal abscess which was drained percutaneously.  He was discharged 1/12 to rehab where he has been having difficulties with chronic dyspnea.  He returned 1/22 with worsening dyspnea and DOE with chest tightness.   He was admitted for respiratory failure and was undergoing w/u to r/o PE. He was in VQ scan when he had worsening dyspnea despite bipap, had rapid decline and ultimately PEA arrest x 2.  He received systemic TPA and TTE suspicious for RA and IVC clot.  He had ongoing profound shock, coagulopathy and significant anemia.  Made DNR in the event of repeat arrest but continued aggressive support.   SUBJECTIVE:  Extubated 1/24. Patient reports feeling okay. Sitting up in bed watching the television.   VITAL SIGNS: BP 111/71   Pulse 98   Temp 98.4 F (36.9 C) (Oral)   Resp 20   Ht 5\' 10"  (1.778 m)   Wt 176 lb 9.4 oz (80.1 kg)   SpO2 100%   BMI 25.34 kg/m   HEMODYNAMICS:    VENTILATOR SETTINGS:    INTAKE / OUTPUT: I/O last 3 completed shifts: In: 6517.7 [I.V.:5848.7; Blood:519; IV Piggyback:150] Out: 1515 [Urine:265; Stool:1250]  PHYSICAL EXAMINATION: General:  Chronically ill appearing male, critically ill  Neuro:  RASS 0, awake, follows commands Cardiovascular:  s1s2 rrr Lungs:  resps even non labored Abdomen:  Dark bloody ostomy outpt Musculoskeletal:  Warm and dry, BUE edema   LABS:  BMET Recent Labs  Lab 09/12/17 0404 09/12/17 1438  09/12/17 2030 09/13/17 0400  NA 137 139 137 138  K 3.7 3.8 4.0 3.7  CL 91* 91*  --  92*  CO2 29 31  --  30  BUN 54* 47*  --  54*  CREATININE 3.71* 4.00*  --  4.21*  GLUCOSE 120* 106*  --  93   Electrolytes Recent Labs  Lab 09/11/17 0042 09/11/17 0541 09/12/17 0404 09/12/17 1438 09/13/17 0400  CALCIUM 6.7* 6.8* 6.4* 6.4* 6.6*  MG 2.0 1.6* 1.7  --  1.7  PHOS 9.9* 8.1* 5.4*  --   --    CBC Recent Labs  Lab 09/12/17 1158 09/12/17 1438 09/12/17 2030 09/13/17 0400  WBC 14.4* 14.7*  --  15.8*  HGB 8.0* 8.1* 8.8* 8.4*  HCT 23.4* 23.9* 26.0* 25.3*  PLT 156 158  --  168   Coag's Recent Labs  Lab 09/11/17 0042 09/11/17 0541 09/11/17 1209  APTT 197* 118* 56*  INR 4.02*  --   --    Sepsis Markers Recent Labs  Lab 09/10/17 1443  09/11/17 0541 09/11/17 0820 09/11/17 1439 09/12/17 0404 09/12/17 1158  LATICACIDVEN  --    < >  --  8.5* 4.7*  --  1.9  PROCALCITON 2.33  --  10.56  --   --  50.13  --    < > = values in this interval not displayed.   ABG Recent Labs  Lab 09/11/17 0246 09/11/17 0508 09/12/17 2030  PHART 7.064* 7.259* 7.486*  PCO2ART 50.3* 40.8 48.3*  PO2ART 507.0* 328.0* 105.0   Liver Enzymes Recent Labs  Lab 09/11/17 0541 09/12/17 0404 09/13/17 0400  AST 1,631* 4,320* 1,411*  ALT 1,425* 2,507* 1,701*  ALKPHOS 165* 170* 185*  BILITOT 1.5* 1.2 1.4*  ALBUMIN 1.5* 1.5* 1.7*   Cardiac Enzymes Recent Labs  Lab 09/12/17 1158 09/12/17 1438 09/12/17 2201  TROPONINI 1.82* 1.62* 1.08*   Glucose Recent Labs  Lab 09/12/17 1142 09/12/17 1529 09/12/17 1944 09/12/17 2342 09/13/17 0324 09/13/17 0743  GLUCAP 117* 115* 94 90 85 87    Imaging Dg Chest Portable 1 View  Result Date: 09/13/2017 CLINICAL DATA:  Respiratory failure. EXAM: PORTABLE CHEST 1 VIEW COMPARISON:  One-view chest x-ray 09/12/2017. FINDINGS: Heart size is exaggerated by low lung volumes. Patient has been extubated. The NG tube courses off the inferior border of the film.  Bilateral pleural effusions and airspace disease have increased. Interstitial edema has increased. IMPRESSION: 1. Interval increase in bibasilar airspace disease and associated effusions. While this may represent atelectasis, infection is not excluded. Findings are worse on the right. 2. New bilateral edema. 3. Interval extubation. Electronically Signed   By: Marin Roberts M.D.   On: 09/13/2017 08:39   Dg Abd Portable 1v  Result Date: 09/12/2017 CLINICAL DATA:  Check nasogastric catheter placement EXAM: PORTABLE ABDOMEN - 1 VIEW COMPARISON:  09/10/2017 FINDINGS: Gastric catheter is noted within the mid stomach slightly advanced when compared with the prior exam. Right lower quadrant ostomy is noted. No obstructive changes are seen. Degenerative change of the lumbar spine is noted. IMPRESSION: Nasogastric catheter within the stomach. Electronically Signed   By: Alcide Clever M.D.   On: 09/12/2017 21:23     STUDIES:  Echo 1/22>>>EF 65-70%, grade 1 diastolic dysfunction, large RA clot measuring 4.63cm x 1cm which is protruding through the TV and is consistent with very large thrombus. The atrium was moderately dilated. Severe RV dysfunction, suspicion of clot in IVC Echo 1/23>>> thrombus in RA no longer seen, EF 60-65%, severely dilated RV CT abd pelvis 1/24>>>  CULTURES: BC x 2 1/22>>>  Urine 1/22>>>   ANTIBIOTICS: Meropenem 1/22>>>   SIGNIFICANT EVENTS: 1/22>>> PEA arrest, TPA  LINES/TUBES: ETT 1/22>>>  L fem CVL 1/22>>>  L fem Aline 1/22>>>    DISCUSSION: 61yo male with shock, coagulopathy, profound anemia after PEA arrest 1/22 likely r/t PE, RA/IVC thrombus s/p systemic thrombolytics.   ASSESSMENT / PLAN:  PULMONARY Acute hypoxic respiratory failure  ?HCAP  Suspect PE  Suspect underlying COPD  P:   Extubated 1/24 Weaning inhaled nitric  PRN BD  Empiric abx as below   CARDIOVASCULAR Shock - cardiogenic. Resolved.  RA/IVC thrombus  Suspect massive PE  Elevated  troponin - troponin trended down P:  Pressors off  F/u echo did no longer visualized RA thrombus  BLE venous dopplers pending  S/p systemic thrombolytics 1/22 Hold home norvasc  Heparin gtt per pharmacy - monitor closely for bleeding  RENAL AKI on CKD -- baseline Scr ~1.9-2.1. UOP 265 cc in the past 24 hrs.  Metabolic acidosis  Recent Labs  Lab 09/11/17 0042 09/11/17 0541 09/12/17 0404 09/12/17 1438 09/13/17 0400  CREATININE 2.99* 3.30* 3.71* 4.00* 4.21*   P:   F/u chem  Continue gentle volume  Consult nephrology   GASTROINTESTINAL transaminitis - ?shock liver v additional thromboembolism - AST and ALT improving  Recent perforated small bowel 12/24 with intra-abd abscess  P:   NPO, failed speech eval  Monitor ostomy bleeding  Trend  LFTs  PPI  CT abd/pelvis pending  Consider surgery v GI input if ongoing bleeding - difficult as he definitely needs ongoing anticoagulation in setting suspected massive PE   HEMATOLOGIC Coagulopathy s/p systemic thrombolytics - overt bleeding improved. Minimal dark bloody ostomy output.  Blood loss anemia  P:   F/u CBC q6h  Heparin gtt as above with suspected massive PE   INFECTIOUS Recent septic shock - EColi bacteremia, intra-abd sepsis  Elevated pct  Leukocytosis  P:   Continue empiric meropenem per pharmacy  Trend lactate, pct  Pan cultures pending   ENDOCRINE Hyperglycemia   P:   SSI   NEUROLOGIC AMS - resolved.  P:   No acute intervention needed at this time   FAMILY  - Updates:   No family available 1/25  - Inter-disciplinary family meet or Palliative Care meeting due by:  Day 7   Attending Note:  62 year old male with PEA arrest after a PE.  On exam, patient is alert and interactive with clear lungs.  I reviewed CXR myself, ETT is in good position.  Discussed with PCCM-NP.    PE:  - Continue heparin  - Once able to swallow will start oral anti-coagulation  Hypoxemia:  - Titrate O2 for sat of  88-92%  Dysphagia:  - TF per nutrition  Sepsis:  - Merrem  Dispo:  - Transfer to SDU and to [facility] with PCCM off 1/26  Deconditioning:  - PT/OT  - OOB  Patient seen and examined, agree with above note.  I dictated the care and orders written for this patient under my direction.  Alyson Reedy, MD [phone]

## 2017-09-13 NOTE — Care Management (Signed)
CM contacted [facility] and informed of admit.  Plan is for pt to discharge back to SNF

## 2017-09-13 NOTE — Progress Notes (Signed)
Progress Note  Patient Name: Caleb Singh Date of Encounter: 09/13/2017  Primary Cardiologist: No primary care provider on file.   Subjective   No CP, SOB, or lightheadedness. Femoral line pulled. Does not remember much over the last few days.   Inpatient Medications    Scheduled Meds: . Chlorhexidine Gluconate Cloth  6 each Topical Daily  . feeding supplement (PRO-STAT SUGAR FREE 64)  30 mL Per Tube BID  . feeding supplement (VITAL HIGH PROTEIN)  1,000 mL Per Tube Q24H  . insulin aspart  2-6 Units Subcutaneous Q4H  . mouth rinse  15 mL Mouth Rinse BID  . sodium chloride flush  10-40 mL Intracatheter Q12H   Continuous Infusions: . sodium chloride    . sodium chloride Stopped (09/13/17 0900)  . sodium chloride    . sodium chloride    . fentaNYL infusion INTRAVENOUS Stopped (09/12/17 1056)  . heparin 1,700 Units/hr (09/13/17 0215)  . meropenem (MERREM) IV Stopped (09/13/17 0245)  . norepinephrine (LEVOPHED) Adult infusion Stopped (09/12/17 1058)   PRN Meds: sodium chloride, albuterol, docusate, fentaNYL (SUBLIMAZE) injection, midazolam, midazolam, ondansetron (ZOFRAN) IV, sodium chloride flush   Vital Signs    Vitals:   09/13/17 0700 09/13/17 0744 09/13/17 0800 09/13/17 0900  BP: 107/63  121/68 111/71  Pulse: 100  (!) 102 98  Resp: 16  (!) 24 20  Temp:  98.4 F (36.9 C)    TempSrc:  Oral    SpO2: 100%  100% 100%  Weight:      Height:        Intake/Output Summary (Last 24 hours) at 09/13/2017 1025 Last data filed at 09/13/2017 0900 Gross per 24 hour  Intake 3094.08 ml  Output 1105 ml  Net 1989.08 ml   Filed Weights   09/10/17 1300 09/12/17 0405 09/13/17 0500  Weight: 149 lb 14.6 oz (68 kg) 173 lb 4.5 oz (78.6 kg) 176 lb 9.4 oz (80.1 kg)    Telemetry    NSR 90-100's - Personally Reviewed  ECG    No new tracings.  Physical Exam   GEN: No acute distress.   Neck: No JVD Cardiac: RRR, no murmurs, rubs, or gallops.  Respiratory: Clear to  auscultation bilaterally. GI: Soft, nontender, non-distended  MS: LUE +1 edema, BLE nonpitting edema. No deformity. Neuro:  Nonfocal  Psych: Normal affect   Labs    Chemistry Recent Labs  Lab 09/11/17 0541 09/12/17 0404 09/12/17 1438 09/12/17 2030 09/13/17 0400  NA 136 137 139 137 138  K 4.0 3.7 3.8 4.0 3.7  CL 94* 91* 91*  --  92*  CO2 17* 29 31  --  30  GLUCOSE 251* 120* 106*  --  93  BUN 47* 54* 47*  --  54*  CREATININE 3.30* 3.71* 4.00*  --  4.21*  CALCIUM 6.8* 6.4* 6.4*  --  6.6*  PROT 5.3* 4.7*  --   --  5.2*  ALBUMIN 1.5* 1.5*  --   --  1.7*  AST 1,631* 4,320*  --   --  1,411*  ALT 1,425* 2,507*  --   --  1,701*  ALKPHOS 165* 170*  --   --  185*  BILITOT 1.5* 1.2  --   --  1.4*  GFRNONAA 18* 16* 15*  --  14*  GFRAA 21* 19* 17*  --  16*  ANIONGAP 25* 17* 17*  --  16*     Hematology Recent Labs  Lab 09/12/17 1158 09/12/17 1438 09/12/17 2030 09/13/17 0400  WBC 14.4* 14.7*  --  15.8*  RBC 2.69* 2.77*  --  2.90*  HGB 8.0* 8.1* 8.8* 8.4*  HCT 23.4* 23.9* 26.0* 25.3*  MCV 87.0 86.3  --  87.2  MCH 29.7 29.2  --  29.0  MCHC 34.2 33.9  --  33.2  RDW 14.8 14.9  --  15.1  PLT 156 158  --  168    Cardiac Enzymes Recent Labs  Lab 09/11/17 2155 09/12/17 1158 09/12/17 1438 09/12/17 2201  TROPONINI 4.42* 1.82* 1.62* 1.08*   No results for input(s): TROPIPOC in the last 168 hours.   BNP Recent Labs  Lab 09/10/17 1124  BNP 189.9*     DDimer No results for input(s): DDIMER in the last 168 hours.   Radiology    Dg Chest Portable 1 View  Result Date: 09/13/2017 CLINICAL DATA:  Respiratory failure. EXAM: PORTABLE CHEST 1 VIEW COMPARISON:  One-view chest x-ray 09/12/2017. FINDINGS: Heart size is exaggerated by low lung volumes. Patient has been extubated. The NG tube courses off the inferior border of the film. Bilateral pleural effusions and airspace disease have increased. Interstitial edema has increased. IMPRESSION: 1. Interval increase in bibasilar  airspace disease and associated effusions. While this may represent atelectasis, infection is not excluded. Findings are worse on the right. 2. New bilateral edema. 3. Interval extubation. Electronically Signed   By: Marin Roberts M.D.   On: 09/13/2017 08:39   Dg Chest Portable 1 View  Result Date: 09/12/2017 CLINICAL DATA:  Respiratory failure.  Ventilator support. EXAM: PORTABLE CHEST 1 VIEW COMPARISON:  09/11/2017 FINDINGS: Endotracheal tube tip is 5 cm above the carina. Nasogastric tube enters the stomach. Mild bibasilar infiltrate in volume loss right more than left with small amount of pleural fluid on the right. IMPRESSION: Endotracheal tube and nasogastric tube well positioned. Bibasilar volume loss/infiltrate right more than left. Electronically Signed   By: Paulina Fusi M.D.   On: 09/12/2017 06:50   Dg Abd Portable 1v  Result Date: 09/12/2017 CLINICAL DATA:  Check nasogastric catheter placement EXAM: PORTABLE ABDOMEN - 1 VIEW COMPARISON:  09/10/2017 FINDINGS: Gastric catheter is noted within the mid stomach slightly advanced when compared with the prior exam. Right lower quadrant ostomy is noted. No obstructive changes are seen. Degenerative change of the lumbar spine is noted. IMPRESSION: Nasogastric catheter within the stomach. Electronically Signed   By: Alcide Clever M.D.   On: 09/12/2017 21:23    Cardiac Studies   Echo 09/11/17: - Left ventricle: The cavity size was normal. Wall thickness was   increased in a pattern of mild LVH. Systolic function was normal.   The estimated ejection fraction was in the range of 60% to 65%.   Wall motion was normal; there were no regional wall motion   abnormalities. - Ventricular septum: Septal motion showed &quot;bounce&quot;. - Aortic valve: Trileaflet; mildly thickened, mildly calcified   leaflets. - Right ventricle: The cavity size was severely dilated. Wall   thickness was normal. Systolic function was severely reduced. - Pulmonic  valve: There was trivial regurgitation.  Lower extremity dopplers 09/11/17: Right: There is evidence of acute DVT in the Posterior Tibial veins, and Peroneal veins. No cystic structure found in the popliteal fossa. Left: There is evidence of acute DVT in the Posterior Tibial veins, and Peroneal veins. No cystic structure found in the popliteal fossa.  Patient Profile     Caleb Singh is a 62 y.o. male with PMH of PE, tobacco abuse, and recent hospitalization for small  bowel perforation from 07/23/2017-08/31/2017 complicated by multisystem failure requiring CRRT, E.Coli bacteremia, respiratory failure, brief arrest, bradycardia s/p temp pace wire (removed 08/26/17; thought to be vagally mediated), and intraabdominal abscess s/p percutaneous drainage, discharged to a SNF 08/31/17, however returned 09/10/17 with DOE and chest tightness, with subsequent respiratory failure from presumed massive PE, with PEA arrest x2 (1/22).    Assessment & Plan    1. Troponin elevation: likely from RV strain related to massive PE - Troponin continues to trend down. 1.08 this am. Peak was 5.18 on 1/23 - Initial TTE with evidence of RA and IVC clot. While in radiology for VQ scan, patient with acutely worsening dyspnea not responsive to Bipap, with subsequent PEA arrest x2. Patient was intubated and started on TPA for presumed massive PE. Repeat TTE with severely dilated RV, without evidence of RA clot, supporting PE diagnosis.  - No s/s ischemia.  2. PEA Arrest: 2/2 PE. S/p successful resuscitation x2. Post-arrest echo with EF 60-65% and no regional wall motion abnormalities. Extubated on 1/24. Now on 4 L [place].  3. Suspected massive PE: DVT in both legs. Attempts to obtain VQ scan unsuccessful due to acute decompensation and PEA arrest. S/p TPA, now on heparin gtt. TTE with evidence of right heart strain, initially with RA thrombus which is no longer present on repeat. EKG with new incomplete RBBB. Unable to get CT chest  due to AKI.  - Heparin drip held briefly this am after femoral line pulled.  - Edema LUE > RUE. No [place] of upper extremities to r/o DVT, but this would not change treatment  4. Acute on chronic kidney disease: baseline Cr. 1.9-2 - Creatinine increasing to 4.21 this morning. K is 3.7. Marginal urine output. Has required CRRT in the past. - AKI limits cardiac/pulmonary work up  5. History of bradyarrhythmia: patient with bradycardia on previous admission requiring temporary pace wire. Thought to be vagally mediated, therefore temp wire removed.  - No bradyarrthymias overnight - Recommend an event monitor at discharge   For questions or updates, please contact [facility] Please consult [email or web address] for contact info under Cardiology/STEMI.      Signed, Alford Highland, NP  09/13/2017, 10:25 AM    History and all data above reviewed.  Patient examined.  I agree with the findings as above.  He is confused about recent events.  Denies chest pain or SOB. The patient exam reveals COR:RRR  ,  Lungs: Clear  ,  Abd: Positive bowel sounds, no rebound no guarding, Ext mild diffuse edema  .  All available labs, radiology testing, previous records reviewed. Agree with documented assessment and plan. Elevated troponin:  As above.  Thought to be related to PE.  No further ischemia work up likely.  Bradycardia:  He has had no further brady events.  No indication for pacing.  We will follow as needed.    Caleb Singh  1:48 PM  09/13/2017

## 2017-09-14 DIAGNOSIS — G9341 Metabolic encephalopathy: Secondary | ICD-10-CM

## 2017-09-14 DIAGNOSIS — E440 Moderate protein-calorie malnutrition: Secondary | ICD-10-CM

## 2017-09-14 DIAGNOSIS — I82403 Acute embolism and thrombosis of unspecified deep veins of lower extremity, bilateral: Secondary | ICD-10-CM

## 2017-09-14 DIAGNOSIS — R570 Cardiogenic shock: Secondary | ICD-10-CM

## 2017-09-14 DIAGNOSIS — R739 Hyperglycemia, unspecified: Secondary | ICD-10-CM

## 2017-09-14 DIAGNOSIS — R7989 Other specified abnormal findings of blood chemistry: Secondary | ICD-10-CM

## 2017-09-14 DIAGNOSIS — R740 Nonspecific elevation of levels of transaminase and lactic acid dehydrogenase [LDH]: Secondary | ICD-10-CM

## 2017-09-14 LAB — IRON AND TIBC
Iron: 84 ug/dL (ref 45–182)
Saturation Ratios: 71 % — ABNORMAL HIGH (ref 17.9–39.5)
TIBC: 118 ug/dL — ABNORMAL LOW (ref 250–450)
UIBC: 34 ug/dL

## 2017-09-14 LAB — PHOSPHORUS: PHOSPHORUS: 5.9 mg/dL — AB (ref 2.5–4.6)

## 2017-09-14 LAB — RENAL FUNCTION PANEL
Albumin: 1.6 g/dL — ABNORMAL LOW (ref 3.5–5.0)
Anion gap: 15 (ref 5–15)
BUN: 55 mg/dL — ABNORMAL HIGH (ref 6–20)
CO2: 27 mmol/L (ref 22–32)
Calcium: 7 mg/dL — ABNORMAL LOW (ref 8.9–10.3)
Chloride: 97 mmol/L — ABNORMAL LOW (ref 101–111)
Creatinine, Ser: 4.99 mg/dL — ABNORMAL HIGH (ref 0.61–1.24)
GFR calc Af Amer: 13 mL/min — ABNORMAL LOW (ref >60)
GFR calc non Af Amer: 11 mL/min — ABNORMAL LOW (ref >60)
Glucose, Bld: 119 mg/dL — ABNORMAL HIGH (ref 65–99)
Phosphorus: 6 mg/dL — ABNORMAL HIGH (ref 2.5–4.6)
Potassium: 3.2 mmol/L — ABNORMAL LOW (ref 3.5–5.1)
Sodium: 139 mmol/L (ref 135–145)

## 2017-09-14 LAB — GLUCOSE, CAPILLARY
Glucose-Capillary: 104 mg/dL — ABNORMAL HIGH (ref 65–99)
Glucose-Capillary: 106 mg/dL — ABNORMAL HIGH (ref 65–99)
Glucose-Capillary: 107 mg/dL — ABNORMAL HIGH (ref 65–99)
Glucose-Capillary: 112 mg/dL — ABNORMAL HIGH (ref 65–99)
Glucose-Capillary: 117 mg/dL — ABNORMAL HIGH (ref 65–99)
Glucose-Capillary: 83 mg/dL (ref 65–99)

## 2017-09-14 LAB — CBC
HCT: 25.2 % — ABNORMAL LOW (ref 39.0–52.0)
HEMOGLOBIN: 8.6 g/dL — AB (ref 13.0–17.0)
MCH: 30.3 pg (ref 26.0–34.0)
MCHC: 34.1 g/dL (ref 30.0–36.0)
MCV: 88.7 fL (ref 78.0–100.0)
Platelets: 175 10*3/uL (ref 150–400)
RBC: 2.84 MIL/uL — AB (ref 4.22–5.81)
RDW: 15.1 % (ref 11.5–15.5)
WBC: 15.8 10*3/uL — AB (ref 4.0–10.5)

## 2017-09-14 LAB — MAGNESIUM: Magnesium: 1.9 mg/dL (ref 1.7–2.4)

## 2017-09-14 LAB — HEPARIN LEVEL (UNFRACTIONATED)
HEPARIN UNFRACTIONATED: 0.28 [IU]/mL — AB (ref 0.30–0.70)
Heparin Unfractionated: 0.29 IU/mL — ABNORMAL LOW (ref 0.30–0.70)

## 2017-09-14 LAB — FERRITIN: Ferritin: >7500 ng/mL — ABNORMAL HIGH (ref 24–336)

## 2017-09-14 MED ORDER — HEPARIN BOLUS VIA INFUSION
1000.0000 [IU] | Freq: Once | INTRAVENOUS | Status: AC
  Administered 2017-09-14: 1000 [IU] via INTRAVENOUS
  Filled 2017-09-14: qty 1000

## 2017-09-14 MED ORDER — POTASSIUM CHLORIDE 20 MEQ PO PACK
20.0000 meq | PACK | Freq: Once | ORAL | Status: AC
  Administered 2017-09-14: 20 meq via ORAL
  Filled 2017-09-14: qty 1

## 2017-09-14 NOTE — Progress Notes (Signed)
  Speech Language Pathology Treatment: Dysphagia  Patient Details Name: Caleb Singh MRN: [id number] DOB: [birth year] Today's Date: 09/14/2017 Time: 1540-1610 SLP Time Calculation (min) (ACUTE ONLY): 30 min  Assessment / Plan / Recommendation Clinical Impression  Patient seen to address dysphagia goals with PO trials. Patient currently with NG tube for nutrition. Patient exhibited mild hoarseness of voice, but did not exhibit any pitch breaks and was able to maintain voicing at conversational level with clinician. Patient expressed that he has soreness in throat when swallowing but that it is "tolerable". Patient very happy to be trying PO's. He consumed a cup of Jello, half a cup of applesauce, 6 ounces of thin, plain water, 4 ounces of orange juice. He exhibited three instances of cough, however each time he expectorated yellow-clear phlegm.  Patient exhibited mild swallow initiation delay, but no oral residuals, voice remained clear and no changes in respirations, with infrequent coughing. Clinician observed patient with PO's over the course of 20 minutes and patient did not exhibit any signs of fatigue, and did not exhibit any decline in swallow function.  Plan to start patient on full liquids diet and solid texture trials, with possible MBS or FEES.    HPI HPI: Pt is a 62 y.o.malewith PMH of PE, tobacco abuse, and recent hospitalization for small bowel perforation from 07/23/2017-08/31/2017 complicated by multisystem failure. Discharged to a SNF 08/31/17, however returned 09/10/17 with DOE and chest tightness, with subsequent respiratory failure from presumed massive PE, with PEA arrest x2 (1/22). Pt intubated 1/22 to 1/24. Failed post-extubation screen. CXR (1/24) revealing interval increase in bibasilar airspace disease and associated effusions. While this may represent atelectasis, infection is not excluded. Findings are worse on the right.       SLP Plan  Continue with current plan of  care       Recommendations  Diet recommendations: Thin liquid;Other(comment)(full liquids (includes jello, applesauce)) Medication Administration: Crushed with puree Supervision: Patient able to self feed;Full supervision/cueing for compensatory strategies Compensations: Minimize environmental distractions;Slow rate;Small sips/bites Postural Changes and/or Swallow Maneuvers: Seated upright 90 degrees;Upright 30-60 min after meal                General recommendations: Rehab consult Oral Care Recommendations: Oral care QID Follow up Recommendations: Skilled Nursing facility;Inpatient Rehab SLP Visit Diagnosis: Dysphagia, pharyngeal phase (R13.13) Plan: Continue with current plan of care       GO               Angela NevinJohn T. Amoree Newlon, MA, CCC-SLP 09/14/17 5:35 PM

## 2017-09-14 NOTE — Progress Notes (Signed)
ANTICOAGULATION CONSULT NOTE  Pharmacy Consult for heparin Indication: pulmonary embolus  No Known Allergies  Patient Measurements: Height: 5\' 10"  (177.8 cm) Weight: 167 lb 4.8 oz (75.9 kg) IBW/kg (Calculated) : 73 Heparin Dosing Weight: 68 kg  Vital Signs: Temp: 99.3 F (37.4 C) (01/26 0522) Temp Source: Axillary (01/26 0522) BP: 105/62 (01/26 0522) Pulse Rate: 102 (01/26 0522)  Labs: Recent Labs    09/11/17 1209  09/12/17 1158 09/12/17 1438 09/12/17 2030 09/12/17 2201 09/13/17 0400 09/13/17 1905 09/14/17 0459  HGB  --    < > 8.0* 8.1* 8.8*  --  8.4*  --  8.6*  HCT  --    < > 23.4* 23.9* 26.0*  --  25.3*  --  25.2*  PLT  --    < > 156 158  --   --  168  --  175  APTT 56*  --   --   --   --   --   --   --   --   HEPARINUNFRC  --    < > 0.17*  --   --  0.32 0.31 0.37 0.29*  CREATININE  --    < >  --  4.00*  --   --  4.21*  --  4.99*  TROPONINI  --    < > 1.82* 1.62*  --  1.08*  --   --   --    < > = values in this interval not displayed.    Estimated Creatinine Clearance: 15.6 mL/min (A) (by C-G formula based on SCr of 4.99 mg/dL (H)).  Assessment: [name] on Heparin for r/o PE - now s/p 2 [facility].  Noted Bilateral lower extremities are positive for acute deep vein thrombosis involving bilateral posterior tibial and peroneal veins.   Heparin level subtherapeutic 0.29 on 1700 units/hr. Heparin was stopped for ~3 hours yesterday, but has been running since last therapeutic level. No increased bleeding noted in chart.   Goal of Therapy:  Heparin level 0.3-0.7 units/ml Monitor platelets by anticoagulation protocol: Yes   Plan:  Increase heparin to 1800 units/hr Daily HL, CBC Monitor bleeding    Ladell PierBrooke Tatum Massman, PharmD Pharmacy Resident [phone] 09/14/2017 7:33 AM

## 2017-09-14 NOTE — Progress Notes (Signed)
Subjective:  Moved to 6E- low grade temps- BPs remain soft but over 100 systolic- UOP up (775) over last 24 hours, BUN and creatinine trended up- stool output was ridiculous(4100) Objective Vital signs in last 24 hours: Vitals:   09/13/17 1920 09/14/17 0029 09/14/17 0522 09/14/17 0746  BP: (!) 96/52 105/61 105/62 107/72  Pulse: (!) 108 (!) 107 (!) 102 (!) 101  Resp: 18 16 14    Temp: 98.4 F (36.9 C) (!) 100.4 F (38 C) 99.3 F (37.4 C) 98.2 F (36.8 C)  TempSrc: Oral Axillary Axillary Axillary  SpO2: 98% 100% 100% 100%  Weight:   75.9 kg (167 lb 4.8 oz)   Height:       Weight change: 1 kg (2 lb 3.3 oz)  Intake/Output Summary (Last 24 hours) at 09/14/2017 0935 Last data filed at 09/14/2017 0700 Gross per 24 hour  Intake 1915.87 ml  Output 4850 ml  Net -2934.13 ml   Assessment/Plan: 62 year old black male with previous acute kidney injury requiring dialysis in December 2018.  He now presents with worsening renal function in the setting of PEA arrest with prolonged hypotension requiring pressors 1.Renal-acute on chronic kidney injury.  Creatinine was noted to be 1.1 in September 2017.  After AK I in December it appears his creatinine nadir was around 2.  Now worsened in the setting of above.  There are no absolute indications for dialysis.  Since is making urine and is fluid overloaded using lasix to augment urine output - will continue.  It is entirely possible he may become dialysis requiring again this hosp and I have told him this 2. Hypertension/volume  -hemodynamically improved, volume overloaded.  was 11 L positive this admission but now only 8 due to excessive stool output.  Peripheral edema but also likely third spacing.  Will cont with Lasix-have stopped fluids but eventually may need again in the setting of high stool output 3.  Electrolytes-   corrected calcium is okay.  Potassium normal/low- will give supp 4. Anemia  -has been low.  Transfuse as needed.  iron stores good - gave  darbe yesterday      Makalya Nave A    Labs: Basic Metabolic Panel: Recent Labs  Lab 09/12/17 1438 09/12/17 2030 09/13/17 0400 09/13/17 1050 09/13/17 1905 09/14/17 0459  NA 139 137 138  --   --  139  K 3.8 4.0 3.7  --   --  3.2*  CL 91*  --  92*  --   --  97*  CO2 31  --  30  --   --  27  GLUCOSE 106*  --  93  --   --  119*  BUN 47*  --  54*  --   --  55*  CREATININE 4.00*  --  4.21*  --   --  4.99*  CALCIUM 6.4*  --  6.6*  --   --  7.0*  PHOS  --   --   --  6.6* 6.6* 5.9*  6.0*   Liver Function Tests: Recent Labs  Lab 09/11/17 0541 09/12/17 0404 09/13/17 0400 09/14/17 0459  AST 1,631* 4,320* 1,411*  --   ALT 1,425* 2,507* 1,701*  --   ALKPHOS 165* 170* 185*  --   BILITOT 1.5* 1.2 1.4*  --   PROT 5.3* 4.7* 5.2*  --   ALBUMIN 1.5* 1.5* 1.7* 1.6*   No results for input(s): LIPASE, AMYLASE in the last 168 hours. No results for input(s): AMMONIA in the last  168 hours. CBC: Recent Labs  Lab 09/11/17 0042  09/12/17 0404 09/12/17 1158 09/12/17 1438 09/12/17 2030 09/13/17 0400 09/14/17 0459  WBC 11.3*   < > 14.5* 14.4* 14.7*  --  15.8* 15.8*  NEUTROABS 8.2*  --   --   --   --   --   --   --   HGB 5.1*   < > 6.7* 8.0* 8.1* 8.8* 8.4* 8.6*  HCT 16.0*   < > 19.8* 23.4* 23.9* 26.0* 25.3* 25.2*  MCV 90.4   < > 85.7 87.0 86.3  --  87.2 88.7  PLT 197   < > 183 156 158  --  168 175   < > = values in this interval not displayed.   Cardiac Enzymes: Recent Labs  Lab 09/11/17 1439 09/11/17 2155 09/12/17 1158 09/12/17 1438 09/12/17 2201  TROPONINI 5.18* 4.42* 1.82* 1.62* 1.08*   CBG: Recent Labs  Lab 09/13/17 0743 09/13/17 1138 09/13/17 1541 09/14/17 0538 09/14/17 0745  GLUCAP 87 93 96 112* 117*    Iron Studies:  Recent Labs    09/14/17 0459  IRON 84  TIBC 118*  FERRITIN >7,500*   Studies/Results: Dg Chest Portable 1 View  Result Date: 09/13/2017 CLINICAL DATA:  Respiratory failure. EXAM: PORTABLE CHEST 1 VIEW COMPARISON:  One-view  chest x-ray 09/12/2017. FINDINGS: Heart size is exaggerated by low lung volumes. Patient has been extubated. The NG tube courses off the inferior border of the film. Bilateral pleural effusions and airspace disease have increased. Interstitial edema has increased. IMPRESSION: 1. Interval increase in bibasilar airspace disease and associated effusions. While this may represent atelectasis, infection is not excluded. Findings are worse on the right. 2. New bilateral edema. 3. Interval extubation. Electronically Signed   By: Marin Roberts M.D.   On: 09/13/2017 08:39   Dg Abd Portable 1v  Result Date: 09/12/2017 CLINICAL DATA:  Check nasogastric catheter placement EXAM: PORTABLE ABDOMEN - 1 VIEW COMPARISON:  09/10/2017 FINDINGS: Gastric catheter is noted within the mid stomach slightly advanced when compared with the prior exam. Right lower quadrant ostomy is noted. No obstructive changes are seen. Degenerative change of the lumbar spine is noted. IMPRESSION: Nasogastric catheter within the stomach. Electronically Signed   By: Alcide Clever M.D.   On: 09/12/2017 21:23   Medications: Infusions: . sodium chloride    . sodium chloride    . sodium chloride    . feeding supplement (JEVITY 1.2 CAL) 1,000 mL (09/14/17 0405)  . fentaNYL infusion INTRAVENOUS Stopped (09/12/17 1056)  . heparin 1,800 Units/hr (09/14/17 0758)  . meropenem (MERREM) IV Stopped (09/14/17 0430)  . norepinephrine (LEVOPHED) Adult infusion Stopped (09/12/17 1058)    Scheduled Medications: . darbepoetin (ARANESP) injection - NON-DIALYSIS  100 mcg Subcutaneous Q Fri-1800  . feeding supplement (PRO-STAT SUGAR FREE 64)  30 mL Per Tube BID  . furosemide  80 mg Intravenous BID  . insulin aspart  2-6 Units Subcutaneous Q4H  . mouth rinse  15 mL Mouth Rinse BID    have reviewed scheduled and prn medications.  Physical Exam: General: sleeping but alert, wants to drink- is NPO with TF Heart: RRR Lungs: mostly clear Abdomen:  ostomy with large output Extremities: pitting edema to dep areas    09/14/2017,9:35 AM  LOS: 4 days

## 2017-09-14 NOTE — Progress Notes (Signed)
ANTICOAGULATION CONSULT NOTE  Pharmacy Consult for heparin Indication: pulmonary embolus  No Known Allergies  Patient Measurements: Height: 5\' 10"  (177.8 cm) Weight: 167 lb 4.8 oz (75.9 kg) IBW/kg (Calculated) : 73 Heparin Dosing Weight: 68 kg  Vital Signs: Temp: 97.7 F (36.5 C) (01/26 1139) Temp Source: Axillary (01/26 1139) BP: 105/74 (01/26 1139) Pulse Rate: 103 (01/26 1139)  Labs: Recent Labs    09/12/17 1158 09/12/17 1438 09/12/17 2030 09/12/17 2201 09/13/17 0400 09/13/17 1905 09/14/17 0459 09/14/17 1622  HGB 8.0* 8.1* 8.8*  --  8.4*  --  8.6*  --   HCT 23.4* 23.9* 26.0*  --  25.3*  --  25.2*  --   PLT 156 158  --   --  168  --  175  --   HEPARINUNFRC 0.17*  --   --  0.32 0.31 0.37 0.29* 0.28*  CREATININE  --  4.00*  --   --  4.21*  --  4.99*  --   TROPONINI 1.82* 1.62*  --  1.08*  --   --   --   --     Estimated Creatinine Clearance: 15.6 mL/min (A) (by C-G formula based on SCr of 4.99 mg/dL (H)).  Assessment: [name] on Heparin for r/o PE - now s/p 2 [facility].  Noted Bilateral lower extremities are positive for acute deep vein thrombosis involving bilateral posterior tibial and peroneal veins.  -heparin level remains below goal on 1800 units/hr  Goal of Therapy:  Heparin level 0.3-0.7 units/ml Monitor platelets by anticoagulation protocol: Yes   Plan:  Heparin bolus 1000 units then increase to  Increase heparin to 1950 units/hr Heparin level daily wth CBC daily  Harland Germanndrew Wylie Russon, Pharm D 09/14/2017 5:16 PM

## 2017-09-14 NOTE — Progress Notes (Signed)
[facility] PROGRESS NOTE  Caleb Singh ZOX:[id number] DOB: 12/08/1955 DOA: 09/10/2017  PCP: Patient, No Pcp Per  Brief History/Interval Summary: 62 year old Caucasian male with past medical history of smoking history of PE who suffered from a perforated small bowel in December for which he underwent small bowel resection complicated by multisystem failure requiring CRRT.  Course was also complicated by E. coli bacteremia, respiratory failure, brief arrest and intra-abdominal abscess which was drained percutaneously.  He was discharged on 1/12 to rehab.  On 1/22 started having worsening shortness of breath and was admitted for the same.  He was undergoing workup to rule out PE and while he was in VQ scan he had worsening dyspnea with a rapid decline and then had PEA arrest.  He received systemic TPA due to strong suspicion for pulmonary embolism.  Transthoracic echocardiogram was suspicious for right atrial and IVC blood clot.  He was admitted to the intensive care unit.  Stabilized and then transferred to the floor.  Consultants: Pulmonology.  Cardiology.  Nephrology.   STUDIES:  Echo 1/22>>>EF 65-70%, grade 1 diastolic dysfunction, large RA clot measuring 4.63cm x 1cm which is protruding through theTV and is consistent with very large thrombus. The atrium wasmoderately dilated. Severe RV dysfunction, suspicion of clot in IVC Echo 1/23>>> thrombus in RA no longer seen, EF 60-65%, severely dilated RV CT abd pelvis 1/24>>>  CULTURES: BC x 2 1/22>>>  Urine 1/22>>>   ANTIBIOTICS: Meropenem 1/22>>>   SIGNIFICANT EVENTS: 1/22>>> PEA arrest, TPA  LINES/TUBES: ETT 1/22>>>  L fem CVL 1/22>>>  L fem Aline 1/22>>>    Subjective/Interval History: Patient continues to have some cough with occasional blood in sputum.  Denies any chest pain.  Shortness of breath is improved.  Denies any nausea or vomiting.  Wondering why he cannot eat by mouth.  ROS: Denies any  headaches  Objective:  Vital Signs  Vitals:   09/14/17 0029 09/14/17 0522 09/14/17 0746 09/14/17 1139  BP: 105/61 105/62 107/72 105/74  Pulse: (!) 107 (!) 102 (!) 101 (!) 103  Resp: 16 14    Temp: (!) 100.4 F (38 C) 99.3 F (37.4 C) 98.2 F (36.8 C) 97.7 F (36.5 C)  TempSrc: Axillary Axillary Axillary Axillary  SpO2: 100% 100% 100% 99%  Weight:  75.9 kg (167 lb 4.8 oz)    Height:        Intake/Output Summary (Last 24 hours) at 09/14/2017 1256 Last data filed at 09/14/2017 1100 Gross per 24 hour  Intake 2360.73 ml  Output 6200 ml  Net -3839.27 ml   Filed Weights   09/13/17 0500 09/13/17 1525 09/14/17 0522  Weight: 80.1 kg (176 lb 9.4 oz) 81.1 kg (178 lb 12.7 oz) 75.9 kg (167 lb 4.8 oz)    General appearance: alert, cooperative, appears stated age and no distress Head: Normocephalic, without obvious abnormality, atraumatic Resp: Diminished air entry at the bases.  No wheezing rales or rhonchi. Cardio: regular rate and rhythm, S1, S2 normal, no murmur, click, rub or gallop GI: Ostomy noted with loose stool in large amounts.  No blood noted.  Abdomen otherwise is nontender.   Extremities: Edema noted lower and upper extremities. Neurologic: No focal deficits.  Lab Results:  Data Reviewed: I have personally reviewed following labs and imaging studies  CBC: Recent Labs  Lab 09/11/17 0042  09/12/17 0404 09/12/17 1158 09/12/17 1438 09/12/17 2030 09/13/17 0400 09/14/17 0459  WBC 11.3*   < > 14.5* 14.4* 14.7*  --  15.8* 15.8*  NEUTROABS 8.2*  --   --   --   --   --   --   --   HGB 5.1*   < > 6.7* 8.0* 8.1* 8.8* 8.4* 8.6*  HCT 16.0*   < > 19.8* 23.4* 23.9* 26.0* 25.3* 25.2*  MCV 90.4   < > 85.7 87.0 86.3  --  87.2 88.7  PLT 197   < > 183 156 158  --  168 175   < > = values in this interval not displayed.    Basic Metabolic Panel: Recent Labs  Lab 09/11/17 0541 09/12/17 0404 09/12/17 1438 09/12/17 2030 09/13/17 0400 09/13/17 1050 09/13/17 1905  09/14/17 0459  NA 136 137 139 137 138  --   --  139  K 4.0 3.7 3.8 4.0 3.7  --   --  3.2*  CL 94* 91* 91*  --  92*  --   --  97*  CO2 17* 29 31  --  30  --   --  27  GLUCOSE 251* 120* 106*  --  93  --   --  119*  BUN 47* 54* 47*  --  54*  --   --  55*  CREATININE 3.30* 3.71* 4.00*  --  4.21*  --   --  4.99*  CALCIUM 6.8* 6.4* 6.4*  --  6.6*  --   --  7.0*  MG 1.6* 1.7  --   --  1.7 1.8 2.0 1.9  PHOS 8.1* 5.4*  --   --   --  6.6* 6.6* 5.9*  6.0*    GFR: Estimated Creatinine Clearance: 15.6 mL/min (A) (by C-G formula based on SCr of 4.99 mg/dL (H)).  Liver Function Tests: Recent Labs  Lab 09/10/17 1123 09/11/17 0042 09/11/17 0541 09/12/17 0404 09/13/17 0400 09/14/17 0459  AST 29 195* 1,631* 4,320* 1,411*  --   ALT 20 158* 1,425* 2,507* 1,701*  --   ALKPHOS 193* 109 165* 170* 185*  --   BILITOT 0.8 0.8 1.5* 1.2 1.4*  --   PROT 8.8* 4.0* 5.3* 4.7* 5.2*  --   ALBUMIN 2.7* 1.1* 1.5* 1.5* 1.7* 1.6*    Coagulation Profile: Recent Labs  Lab 09/11/17 0042  INR 4.02*    Cardiac Enzymes: Recent Labs  Lab 09/11/17 1439 09/11/17 2155 09/12/17 1158 09/12/17 1438 09/12/17 2201  TROPONINI 5.18* 4.42* 1.82* 1.62* 1.08*    CBG: Recent Labs  Lab 09/13/17 1138 09/13/17 1541 09/14/17 0538 09/14/17 0745 09/14/17 1137  GLUCAP 93 96 112* 117* 83    Anemia Panel: Recent Labs    09/14/17 0459  FERRITIN >7,500*  TIBC 118*  IRON 84    Recent Results (from the past 240 hour(s))  Blood culture (routine x 2)     Status: None (Preliminary result)   Collection Time: 09/10/17 12:14 PM  Result Value Ref Range Status   Specimen Description BLOOD RIGHT FOREARM  Final   Special Requests   Final    BOTTLES DRAWN AEROBIC AND ANAEROBIC Blood Culture adequate volume   Culture NO GROWTH 3 DAYS  Final   Report Status PENDING  Incomplete  Blood culture (routine x 2)     Status: None (Preliminary result)   Collection Time: 09/10/17 12:30 PM  Result Value Ref Range Status    Specimen Description BLOOD RIGHT ANTECUBITAL  Final   Special Requests   Final    BOTTLES DRAWN AEROBIC AND ANAEROBIC Blood Culture adequate volume   Culture NO GROWTH 3  DAYS  Final   Report Status PENDING  Incomplete  Urine culture     Status: None   Collection Time: 09/12/17 12:48 AM  Result Value Ref Range Status   Specimen Description URINE, RANDOM  Final   Special Requests NONE  Final   Culture NO GROWTH  Final   Report Status 09/13/2017 FINAL  Final  MRSA PCR Screening     Status: None   Collection Time: 09/12/17  3:29 PM  Result Value Ref Range Status   MRSA by PCR NEGATIVE NEGATIVE Final    Comment:        The GeneXpert MRSA Assay (FDA approved for NASAL specimens only), is one component of a comprehensive MRSA colonization surveillance program. It is not intended to diagnose MRSA infection nor to guide or monitor treatment for MRSA infections.       Radiology Studies: Dg Chest Portable 1 View  Result Date: 09/13/2017 CLINICAL DATA:  Respiratory failure. EXAM: PORTABLE CHEST 1 VIEW COMPARISON:  One-view chest x-ray 09/12/2017. FINDINGS: Heart size is exaggerated by low lung volumes. Patient has been extubated. The NG tube courses off the inferior border of the film. Bilateral pleural effusions and airspace disease have increased. Interstitial edema has increased. IMPRESSION: 1. Interval increase in bibasilar airspace disease and associated effusions. While this may represent atelectasis, infection is not excluded. Findings are worse on the right. 2. New bilateral edema. 3. Interval extubation. Electronically Signed   By: Marin Roberts M.D.   On: 09/13/2017 08:39   Dg Abd Portable 1v  Result Date: 09/12/2017 CLINICAL DATA:  Check nasogastric catheter placement EXAM: PORTABLE ABDOMEN - 1 VIEW COMPARISON:  09/10/2017 FINDINGS: Gastric catheter is noted within the mid stomach slightly advanced when compared with the prior exam. Right lower quadrant ostomy is noted. No  obstructive changes are seen. Degenerative change of the lumbar spine is noted. IMPRESSION: Nasogastric catheter within the stomach. Electronically Signed   By: Alcide Clever M.D.   On: 09/12/2017 21:23     Medications:  Scheduled: . darbepoetin (ARANESP) injection - NON-DIALYSIS  100 mcg Subcutaneous Q Fri-1800  . feeding supplement (PRO-STAT SUGAR FREE 64)  30 mL Per Tube BID  . furosemide  80 mg Intravenous BID  . insulin aspart  2-6 Units Subcutaneous Q4H  . mouth rinse  15 mL Mouth Rinse BID   Continuous: . sodium chloride    . sodium chloride    . sodium chloride    . feeding supplement (JEVITY 1.2 CAL) 1,000 mL (09/14/17 0405)  . fentaNYL infusion INTRAVENOUS Stopped (09/12/17 1056)  . heparin 1,800 Units/hr (09/14/17 1220)  . meropenem (MERREM) IV Stopped (09/14/17 0430)  . norepinephrine (LEVOPHED) Adult infusion Stopped (09/12/17 1058)   WUJ:WJXBJY chloride, albuterol, docusate, fentaNYL (SUBLIMAZE) injection, midazolam, midazolam, ondansetron (ZOFRAN) IV  Assessment/Plan:   Acute hypoxic respiratory failure  Most likely secondary to massive PE.  Patient was intubated in the intensive care unit.  Extubated on 1/24.  Seems to be stable from a respiratory standpoint.  Chest x-ray yesterday did show bibasilar opacities.  Thought to be atelectasis though infection cannot be ruled out.  Continue to monitor for now.  May need to repeat this in the next couple of days.  Shock - cardiogenic/PEA arrest Resolved. Has been off of pressors.   Suspect massive pulmonary embolism/RA/IVC thrombus//acute lower extremity DVT F/u echo did no longer visualized RA thrombus.  DVT noted bilateral lower extremities. S/p systemic thrombolytics 1/22. Holding home norvasc. Heparin gtt per pharmacy. Monitor closely  for bleeding.    Elevated troponin/history of bradyarrhythmia Patient seen by cardiology for elevated troponin.  Thought to be secondary to all of the other underlying issues.  No  evidence for ischemia.  Previous admission he did require temporary pacing wire.  No bradyarrhythmias noted during this hospitalization yet.  AKI on CKD Baseline Scr ~1.9-2.1.  Creatinine continues to rise.  Nephrology is following.  He does have some urine output.  Previously has required CRRT.   Transaminitis Most likely due to shock liver.  LFTs have been improving.  Monitor periodically.  Recent perforated small bowel 12/24 with intra-abd abscess/high ostomy output Patient noted to have significant output from his ostomy.  Loose stool.  Nonbloody.  CT abdomen pelvis was ordered yesterday but not done yet.  May have to consult surgery or gastroenterology depending on the findings.  However no bleeding has been noted.  Normocytic anemia with likely component of acute blood loss  Hemoglobin has been stable.  No overt bleeding is noted.  Continue to monitor closely.    Recent hospitalization for intra-abdominal abscess and E. coli bacteremia  Blood cultures have been negative however patient's pro calcitonin was noted to be significantly elevated at 50.13.  We will need to repeat this test.  Does not appear to be overtly septic at this time.  Empirically on meropenem.  Await CT scan.    Hyperglycemia   SSI   Acute metabolic encephalopathy Appears to be improving.  No focal neurological deficits.  Will need to be seen by PT and OT.    Dysphagia/Nutrition Being followed by speech therapy.  Noted to have moderate aspiration risk.  Currently getting tube feedings.  Malnutrition of moderate degree Currently on tube feedings.   DVT Prophylaxis: On IV heparin    Code Status: Partial code Family Communication: No family at bedside Disposition Plan: Management as outlined above.    LOS: 4 days   Osvaldo ShipperGokul Mailee Klaas  [facility] Pager [phone] 09/14/2017, 12:56 PM  If 7PM-7AM, please contact night-coverage at [email or web address], password [email or web address]

## 2017-09-15 ENCOUNTER — Inpatient Hospital Stay (HOSPITAL_COMMUNITY): Payer: Non-veteran care

## 2017-09-15 ENCOUNTER — Other Ambulatory Visit: Payer: Self-pay | Admitting: Student

## 2017-09-15 DIAGNOSIS — R001 Bradycardia, unspecified: Secondary | ICD-10-CM

## 2017-09-15 LAB — RENAL FUNCTION PANEL
ALBUMIN: 1.7 g/dL — AB (ref 3.5–5.0)
Anion gap: 16 — ABNORMAL HIGH (ref 5–15)
BUN: 63 mg/dL — AB (ref 6–20)
CALCIUM: 7.7 mg/dL — AB (ref 8.9–10.3)
CO2: 24 mmol/L (ref 22–32)
CREATININE: 5.54 mg/dL — AB (ref 0.61–1.24)
Chloride: 103 mmol/L (ref 101–111)
GFR calc Af Amer: 11 mL/min — ABNORMAL LOW (ref >60)
GFR calc non Af Amer: 10 mL/min — ABNORMAL LOW (ref >60)
GLUCOSE: 126 mg/dL — AB (ref 65–99)
PHOSPHORUS: 5.7 mg/dL — AB (ref 2.5–4.6)
Potassium: 3.2 mmol/L — ABNORMAL LOW (ref 3.5–5.1)
SODIUM: 143 mmol/L (ref 135–145)

## 2017-09-15 LAB — GLUCOSE, CAPILLARY
Glucose-Capillary: 104 mg/dL — ABNORMAL HIGH (ref 65–99)
Glucose-Capillary: 109 mg/dL — ABNORMAL HIGH (ref 65–99)
Glucose-Capillary: 118 mg/dL — ABNORMAL HIGH (ref 65–99)
Glucose-Capillary: 94 mg/dL (ref 65–99)
Glucose-Capillary: 104 mg/dL — ABNORMAL HIGH (ref 65–99)

## 2017-09-15 LAB — CBC
HCT: 27.5 % — ABNORMAL LOW (ref 39.0–52.0)
HEMOGLOBIN: 8.6 g/dL — AB (ref 13.0–17.0)
MCH: 28.4 pg (ref 26.0–34.0)
MCHC: 31.3 g/dL (ref 30.0–36.0)
MCV: 90.8 fL (ref 78.0–100.0)
PLATELETS: 185 10*3/uL (ref 150–400)
RBC: 3.03 MIL/uL — ABNORMAL LOW (ref 4.22–5.81)
RDW: 15.3 % (ref 11.5–15.5)
WBC: 14.5 10*3/uL — ABNORMAL HIGH (ref 4.0–10.5)

## 2017-09-15 LAB — BPAM RBC
BLOOD PRODUCT EXPIRATION DATE: 201902152359
BLOOD PRODUCT EXPIRATION DATE: 201902152359
BLOOD PRODUCT EXPIRATION DATE: 201902152359
BLOOD PRODUCT EXPIRATION DATE: 201902152359
Blood Product Expiration Date: 201902152359
Blood Product Expiration Date: 201902152359
ISSUE DATE / TIME: 201901230500
ISSUE DATE / TIME: 201901230246
ISSUE DATE / TIME: 201901240611
ISSUE DATE / TIME: 201901240829
UNIT TYPE AND RH: 5100
UNIT TYPE AND RH: 5100
Unit Type and Rh: 5100
Unit Type and Rh: 5100
Unit Type and Rh: 5100
Unit Type and Rh: 5100

## 2017-09-15 LAB — TYPE AND SCREEN
ABO/RH(D): O POS
ANTIBODY SCREEN: NEGATIVE
UNIT DIVISION: 0
UNIT DIVISION: 0
UNIT DIVISION: 0
Unit division: 0
Unit division: 0
Unit division: 0

## 2017-09-15 LAB — HEPARIN LEVEL (UNFRACTIONATED): HEPARIN UNFRACTIONATED: 0.37 [IU]/mL (ref 0.30–0.70)

## 2017-09-15 LAB — CULTURE, BLOOD (ROUTINE X 2)
CULTURE: NO GROWTH
Culture: NO GROWTH
SPECIAL REQUESTS: ADEQUATE
SPECIAL REQUESTS: ADEQUATE

## 2017-09-15 LAB — PROCALCITONIN: Procalcitonin: 15.48 ng/mL

## 2017-09-15 MED ORDER — POTASSIUM CHLORIDE 20 MEQ PO PACK
20.0000 meq | PACK | Freq: Once | ORAL | Status: AC
  Administered 2017-09-15: 20 meq via ORAL
  Filled 2017-09-15: qty 1

## 2017-09-15 MED ORDER — SODIUM CHLORIDE 0.9 % IV SOLN: 250.0000 mL | INTRAVENOUS | Status: DC | PRN

## 2017-09-15 MED ORDER — BENZONATATE 100 MG PO CAPS
100.0000 mg | ORAL_CAPSULE | Freq: Three times a day (TID) | ORAL | Status: DC | PRN
  Administered 2017-09-15: 100 mg via ORAL
  Filled 2017-09-15: qty 1

## 2017-09-15 MED ORDER — LOPERAMIDE HCL 2 MG PO CAPS
4.0000 mg | ORAL_CAPSULE | Freq: Three times a day (TID) | ORAL | Status: AC
  Administered 2017-09-15 (×3): 4 mg via ORAL
  Filled 2017-09-15 (×3): qty 2

## 2017-09-15 MED ORDER — PSYLLIUM 95 % PO PACK
1.0000 | PACK | Freq: Every day | ORAL | Status: DC
  Administered 2017-09-15: 1 via ORAL
  Filled 2017-09-15: qty 1

## 2017-09-15 NOTE — Progress Notes (Addendum)
[facility] PROGRESS NOTE  Caleb Singh OZH:[id number] DOB: 1956-05-22 DOA: 09/10/2017  PCP: Patient, No Pcp Per  Brief History/Interval Summary: 62 year old Caucasian male with past medical history of smoking history of PE who suffered from a perforated small bowel in December for which he underwent small bowel resection complicated by multisystem failure requiring CRRT.  Course was also complicated by E. coli bacteremia, respiratory failure, brief arrest and intra-abdominal abscess which was drained percutaneously.  He was discharged on 1/12 to rehab.  On 1/22 started having worsening shortness of breath and was admitted for the same.  He was undergoing workup to rule out PE and while he was in VQ scan he had worsening dyspnea with a rapid decline and then had PEA arrest.  He received systemic TPA due to strong suspicion for pulmonary embolism.  Transthoracic echocardiogram was suspicious for right atrial and IVC blood clot.  He was admitted to the intensive care unit.  Stabilized and then transferred to the floor.  Consultants: Pulmonology.  Cardiology.  Nephrology.   STUDIES:  Echo 1/22>>>EF 65-70%, grade 1 diastolic dysfunction, large RA clot measuring 4.63cm x 1cm which is protruding through theTV and is consistent with very large thrombus. The atrium wasmoderately dilated. Severe RV dysfunction, suspicion of clot in IVC Echo 1/23>>> thrombus in RA no longer seen, EF 60-65%, severely dilated RV CT abd pelvis 1/24>>>  CULTURES: BC x 2 1/22>>>  Urine 1/22>>>   ANTIBIOTICS: Meropenem 1/22>>>   SIGNIFICANT EVENTS: 1/22>>> PEA arrest, TPA  LINES/TUBES: ETT 1/22>>>  L fem CVL 1/22>>>  L fem Aline 1/22>>>    Subjective/Interval History: Patient continues to have cough but overall he does feel better.  Denies any nausea vomiting.    ROS: Denies any headaches.  Objective:  Vital Signs  Vitals:   09/14/17 2340 09/15/17 0410 09/15/17 0733 09/15/17 1021  BP:  111/72 112/71 117/75 116/78  Pulse:   (!) 104   Resp: 18 17 18    Temp: (!) 100.6 F (38.1 C) 99.4 F (37.4 C) 99 F (37.2 C)   TempSrc: Oral Oral Oral   SpO2: 100% 100% 100% 100%  Weight:  72.3 kg (159 lb 6.3 oz)    Height:        Intake/Output Summary (Last 24 hours) at 09/15/2017 1133 Last data filed at 09/15/2017 0408 Gross per 24 hour  Intake 1122 ml  Output 5000 ml  Net -3878 ml   Filed Weights   09/13/17 1525 09/14/17 0522 09/15/17 0410  Weight: 81.1 kg (178 lb 12.7 oz) 75.9 kg (167 lb 4.8 oz) 72.3 kg (159 lb 6.3 oz)    General appearance: Awake alert.  In no distress. Resp: Diminished air entry at the bases.  No wheezing rales or rhonchi.  Normal effort at rest. Cardio: S1-S2 is normal regular.  No S3-S4.  No rubs murmurs or bruit GI: Ostomy again noted with large amounts of brown colored stool.  No blood noted.  Abdomen otherwise is nontender.  Incision wound noted with yellowish exudate.   Extremities: Edema is present bilateral lower extremities as well as in the upper extremities. Neurologic: No focal deficits.  Lab Results:  Data Reviewed: I have personally reviewed following labs and imaging studies  CBC: Recent Labs  Lab 09/11/17 0042  09/12/17 1158 09/12/17 1438 09/12/17 2030 09/13/17 0400 09/14/17 0459 09/15/17 0744  WBC 11.3*   < > 14.4* 14.7*  --  15.8* 15.8* 14.5*  NEUTROABS 8.2*  --   --   --   --   --   --   --  HGB 5.1*   < > 8.0* 8.1* 8.8* 8.4* 8.6* 8.6*  HCT 16.0*   < > 23.4* 23.9* 26.0* 25.3* 25.2* 27.5*  MCV 90.4   < > 87.0 86.3  --  87.2 88.7 90.8  PLT 197   < > 156 158  --  168 175 185   < > = values in this interval not displayed.    Basic Metabolic Panel: Recent Labs  Lab 09/12/17 0404 09/12/17 1438 09/12/17 2030 09/13/17 0400 09/13/17 1050 09/13/17 1905 09/14/17 0459 09/15/17 0744  NA 137 139 137 138  --   --  139 143  K 3.7 3.8 4.0 3.7  --   --  3.2* 3.2*  CL 91* 91*  --  92*  --   --  97* 103  CO2 29 31  --  30  --    --  27 24  GLUCOSE 120* 106*  --  93  --   --  119* 126*  BUN 54* 47*  --  54*  --   --  55* 63*  CREATININE 3.71* 4.00*  --  4.21*  --   --  4.99* 5.54*  CALCIUM 6.4* 6.4*  --  6.6*  --   --  7.0* 7.7*  MG 1.7  --   --  1.7 1.8 2.0 1.9  --   PHOS 5.4*  --   --   --  6.6* 6.6* 5.9*  6.0* 5.7*    GFR: Estimated Creatinine Clearance: 14 mL/min (A) (by C-G formula based on SCr of 5.54 mg/dL (H)).  Liver Function Tests: Recent Labs  Lab 09/10/17 1123 09/11/17 0042 09/11/17 0541 09/12/17 0404 09/13/17 0400 09/14/17 0459 09/15/17 0744  AST 29 195* 1,631* 4,320* 1,411*  --   --   ALT 20 158* 1,425* 2,507* 1,701*  --   --   ALKPHOS 193* 109 165* 170* 185*  --   --   BILITOT 0.8 0.8 1.5* 1.2 1.4*  --   --   PROT 8.8* 4.0* 5.3* 4.7* 5.2*  --   --   ALBUMIN 2.7* 1.1* 1.5* 1.5* 1.7* 1.6* 1.7*    Coagulation Profile: Recent Labs  Lab 09/11/17 0042  INR 4.02*    Cardiac Enzymes: Recent Labs  Lab 09/11/17 1439 09/11/17 2155 09/12/17 1158 09/12/17 1438 09/12/17 2201  TROPONINI 5.18* 4.42* 1.82* 1.62* 1.08*    CBG: Recent Labs  Lab 09/14/17 1727 09/14/17 2006 09/15/17 0000 09/15/17 0403 09/15/17 0733  GLUCAP 106* 104* 107* 104* 109*    Anemia Panel: Recent Labs    09/14/17 0459  FERRITIN >7,500*  TIBC 118*  IRON 84    Recent Results (from the past 240 hour(s))  Blood culture (routine x 2)     Status: None (Preliminary result)   Collection Time: 09/10/17 12:14 PM  Result Value Ref Range Status   Specimen Description BLOOD RIGHT FOREARM  Final   Special Requests   Final    BOTTLES DRAWN AEROBIC AND ANAEROBIC Blood Culture adequate volume   Culture NO GROWTH 4 DAYS  Final   Report Status PENDING  Incomplete  Blood culture (routine x 2)     Status: None (Preliminary result)   Collection Time: 09/10/17 12:30 PM  Result Value Ref Range Status   Specimen Description BLOOD RIGHT ANTECUBITAL  Final   Special Requests   Final    BOTTLES DRAWN AEROBIC AND  ANAEROBIC Blood Culture adequate volume   Culture NO GROWTH 4 DAYS  Final  Report Status PENDING  Incomplete  Urine culture     Status: None   Collection Time: 09/12/17 12:48 AM  Result Value Ref Range Status   Specimen Description URINE, RANDOM  Final   Special Requests NONE  Final   Culture NO GROWTH  Final   Report Status 09/13/2017 FINAL  Final  MRSA PCR Screening     Status: None   Collection Time: 09/12/17  3:29 PM  Result Value Ref Range Status   MRSA by PCR NEGATIVE NEGATIVE Final    Comment:        The GeneXpert MRSA Assay (FDA approved for NASAL specimens only), is one component of a comprehensive MRSA colonization surveillance program. It is not intended to diagnose MRSA infection nor to guide or monitor treatment for MRSA infections.       Radiology Studies: Ct Abdomen Pelvis Wo Contrast  Result Date: 09/15/2017 CLINICAL DATA:  62 year old male with a history of possible GI bleeding and ascites EXAM: CT ABDOMEN AND PELVIS WITHOUT CONTRAST TECHNIQUE: Multidetector CT imaging of the abdomen and pelvis was performed following the standard protocol without IV contrast. COMPARISON:  CT 08/22/2017, 08/17/2017 FINDINGS: Lower chest: Mixed ground-glass and nodular opacity of the right lower lobe. Small bilateral pleural effusions with associated atelectasis. Hepatobiliary: Unremarkable appearance of liver parenchyma. Hyperdense material within the gallbladder lumen, new from the comparison. Pericholecystic fluid. No intrahepatic or extrahepatic biliary ductal dilatation. Pancreas: Unremarkable appearance of pancreas Spleen: Unremarkable spleen Adrenals/Urinary Tract: Unremarkable adrenal glands. Right kidney with no hydronephrosis or nephrolithiasis. Redemonstration of cyst of the posterior right kidney cortex. No left-sided hydronephrosis or nephrolithiasis. Hyperdense focus in the posterior left kidney cortex, unchanged. Urinary catheter within the urinary bladder which is  decompressed. Gas within the urinary bladder, likely secondary to manipulation. Stomach/Bowel: Gastric tube terminates within the stomach lumen. No evidence of abnormally distended small bowel or colon. Ostomy within the right abdomen again demonstrated. Surgical changes at the right lower quadrant of prior bowel resection. Evaluation of small bowel and colon wall limited by the exclusion of contrast. There are no focal loops of small bowel or colon with hyperdense material that would indicate accumulating blood products. Vascular/Lymphatic: Scattered vascular calcifications. Small lymph nodes in the base of the mesentery and periaortic nodal stations, unchanged. There is diffuse missed the mesentery and hazy infiltration of the fat within the abdomen. Re-demonstration of infiltration of the lower chest and abdominal wall. Reproductive: Unremarkable pelvic structures. Other: Midline surgical changes. Since the prior CT, there has been interval removal of the pigtail drainage catheter of the right lower quadrant, as well as the surgical drains of the upper abdomen. Musculoskeletal: No acute displaced fracture. Multilevel degenerative changes of the thoracic and lumbar spine. Schmorl's node of the lower L1 endplate. IMPRESSION: There is no CT finding that would confirm or exclude GI hemorrhage. If there is ongoing clinical concern, a nuclear medicine tagged red bowel study may be considered. Hyperdense material within the gallbladder lumen, may reflect vicarious excretion of contrast or microlithiasis. If there is concern for acute biliary obstruction, correlation with physical exam and HIDA study may be considered. Diffuse abdominal and body wall edema/anasarca. Airspace disease of the right lower lobe, concerning for pneumonia. Interval removal of pigtail drainage catheter and surgical drains. Redemonstration of surgical changes along the midline abdomen and ostomy, status post bowel resection. Small bilateral  pleural effusions. Electronically Signed   By: Gilmer MorJaime  Wagner D.O.   On: 09/15/2017 10:13     Medications:  Scheduled: .  darbepoetin (ARANESP) injection - NON-DIALYSIS  100 mcg Subcutaneous Q Fri-1800  . feeding supplement (PRO-STAT SUGAR FREE 64)  30 mL Per Tube BID  . insulin aspart  2-6 Units Subcutaneous Q4H  . mouth rinse  15 mL Mouth Rinse BID  . potassium chloride  20 mEq Oral Once   Continuous: . sodium chloride    . sodium chloride    . sodium chloride    . feeding supplement (JEVITY 1.2 CAL) 1,000 mL (09/14/17 2111)  . fentaNYL infusion INTRAVENOUS Stopped (09/12/17 1056)  . heparin 2,000 Units/hr (09/15/17 1007)   ZOX:WRUEAV chloride, albuterol, docusate, fentaNYL (SUBLIMAZE) injection, ondansetron (ZOFRAN) IV  Assessment/Plan:   Acute hypoxic respiratory failure  Most likely secondary to massive PE.  Patient was intubated in the intensive care unit.  Extubated on 1/24.  Seems to be stable from a respiratory standpoint.  Chest x-ray did show bibasilar opacities.  Thought to be atelectasis though infection cannot be ruled out.  CT scan done of the abdomen and pelvis showeddisease of the right lung.  Small pleural effusions were noted.  It is quite possible he may have aspirated as well.  These changes could also be due to pulmonary embolism.  WBC is noted to be high but he is afebrile.  Continue to monitor for now.  We will leave him off of antibiotics.  Shock - cardiogenic/PEA arrest Resolved. Has been off of pressors.   Suspect massive pulmonary embolism/RA/IVC thrombus//acute lower extremity DVT F/u echo did no longer visualized RA thrombus.  DVT noted bilateral lower extremities. S/p systemic thrombolytics 1/22. Holding home norvasc. Heparin gtt per pharmacy. Monitor closely for bleeding.    Elevated troponin/history of bradyarrhythmia Patient seen by cardiology for elevated troponin.  Thought to be secondary to all of the other underlying issues.  No evidence for  ischemia.  Previous admission he did require temporary pacing wire.  No bradyarrhythmias noted during this hospitalization yet.  AKI on CKD Baseline Scr ~1.9-2.1.  Continues to rise.  Nephrology is following.  He is making urine.  Previously he has required CRRT.    Transaminitis Most likely due to shock liver.  LFTs have been improving.  Monitor periodically.  Recent perforated small bowel 12/24 with intra-abd abscess/high ostomy output Patient noted to have significant output from his ostomy.  Loose stool.  Nonbloody.  CT of abdomen pelvis does not show anything concerning.  No intra-abdominal fluid collection noted.  Reason for high output from his ostomy not clear.  Could be due to short gut syndrome.  Will discuss with general surgery.  May need to use antimotility agents.  No bleeding noted.  Hemoglobin is stable.    Normocytic anemia with likely component of acute blood loss  Hemoglobin has been stable.  No overt bleeding is noted.  Continue to monitor closely.    Recent hospitalization for intra-abdominal abscess and E. coli bacteremia  Blood cultures have been negative however patient's pro calcitonin was noted to be significantly elevated at 50.13.  Repeat levels today show improvement at 15.48.  Has been empirically on meropenem.  CT scan does not show any infectious etiology.  Blood cultures have been negative so far.  Continue till further improvement and pro calcitonin levels.  Hyperglycemia   SSI   Acute metabolic encephalopathy Appears to be improving.  No focal neurological deficits.  Will need to be seen by PT and OT.    Dysphagia/Nutrition Being followed by speech therapy.  Noted to have moderate aspiration risk.  Currently  getting tube feedings.  Malnutrition of moderate degree Currently on tube feedings.   DVT Prophylaxis: On IV heparin    Code Status: Partial code Family Communication: No family at bedside Disposition Plan: Management as outlined  above.    LOS: 5 days   Osvaldo Shipper  [facility] Pager [phone] 09/15/2017, 11:33 AM  If 7PM-7AM, please contact night-coverage at [email or web address], password [facility]

## 2017-09-15 NOTE — Progress Notes (Signed)
Subjective:   low grade temps- BPs btter- UOP 1400 over last 24 hours, BUN and creatinine trended up- stool output was ridiculous(5000) overall 4700 negative   Objective Vital signs in last 24 hours: Vitals:   09/14/17 2340 09/15/17 0410 09/15/17 0733 09/15/17 1021  BP: 111/72 112/71 117/75 116/78  Pulse:   (!) 104   Resp: 18 17 18    Temp: (!) 100.6 F (38.1 C) 99.4 F (37.4 C) 99 F (37.2 C)   TempSrc: Oral Oral Oral   SpO2: 100% 100% 100% 100%  Weight:  72.3 kg (159 lb 6.3 oz)    Height:       Weight change: -8.8 kg (-6.4 oz)  Intake/Output Summary (Last 24 hours) at 09/15/2017 1049 Last data filed at 09/15/2017 0408 Gross per 24 hour  Intake 1673.03 ml  Output 5000 ml  Net -3326.97 ml   Assessment/Plan: 62 year old black male with previous acute kidney injury requiring dialysis in December 2018.  He now presents with worsening renal function in the setting of PEA arrest with prolonged hypotension requiring pressors 1.Renal-acute on chronic kidney injury.  Creatinine was noted to be 1.1 in September 2017.  After AKI in December it appears his creatinine nadir was around 2.  Now worsened in the setting of above.  There are no absolute indications for dialysis.  Is nonoliguric,  using lasix to augment urine output but will stop because now only 3 liters negative and some indication of intravasc volume depletion.  It is entirely possible he may become dialysis requiring again this hosp and I have told him this 2. Hypertension/volume  -hemodynamically improved, was volume overloaded- as much as 13  L positive this admission but now only 3 liters pos due to excessive stool output.  Peripheral edema but also likely third spacing.  Will stop Lasix-had stopped fluids but needs again in the setting of high stool output- ? Try octreotide? Per primary  3.  Electrolytes-   corrected calcium is okay.  Potassium low- giving supp 4. Anemia  -has been low.  Transfuse as needed.  iron stores good -  gave darbe Sat      Mehran Guderian A    Labs: Basic Metabolic Panel: Recent Labs  Lab 09/13/17 0400  09/13/17 1905 09/14/17 0459 09/15/17 0744  NA 138  --   --  139 143  K 3.7  --   --  3.2* 3.2*  CL 92*  --   --  97* 103  CO2 30  --   --  27 24  GLUCOSE 93  --   --  119* 126*  BUN 54*  --   --  55* 63*  CREATININE 4.21*  --   --  4.99* 5.54*  CALCIUM 6.6*  --   --  7.0* 7.7*  PHOS  --    < > 6.6* 5.9*  6.0* 5.7*   < > = values in this interval not displayed.   Liver Function Tests: Recent Labs  Lab 09/11/17 0541 09/12/17 0404 09/13/17 0400 09/14/17 0459 09/15/17 0744  AST 1,631* 4,320* 1,411*  --   --   ALT 1,425* 2,507* 1,701*  --   --   ALKPHOS 165* 170* 185*  --   --   BILITOT 1.5* 1.2 1.4*  --   --   PROT 5.3* 4.7* 5.2*  --   --   ALBUMIN 1.5* 1.5* 1.7* 1.6* 1.7*   No results for input(s): LIPASE, AMYLASE in the last 168 hours. No results  for input(s): AMMONIA in the last 168 hours. CBC: Recent Labs  Lab 09/11/17 0042  09/12/17 1158 09/12/17 1438  09/13/17 0400 09/14/17 0459 09/15/17 0744  WBC 11.3*   < > 14.4* 14.7*  --  15.8* 15.8* 14.5*  NEUTROABS 8.2*  --   --   --   --   --   --   --   HGB 5.1*   < > 8.0* 8.1*   < > 8.4* 8.6* 8.6*  HCT 16.0*   < > 23.4* 23.9*   < > 25.3* 25.2* 27.5*  MCV 90.4   < > 87.0 86.3  --  87.2 88.7 90.8  PLT 197   < > 156 158  --  168 175 185   < > = values in this interval not displayed.   Cardiac Enzymes: Recent Labs  Lab 09/11/17 1439 09/11/17 2155 09/12/17 1158 09/12/17 1438 09/12/17 2201  TROPONINI 5.18* 4.42* 1.82* 1.62* 1.08*   CBG: Recent Labs  Lab 09/14/17 1727 09/14/17 2006 09/15/17 0000 09/15/17 0403 09/15/17 0733  GLUCAP 106* 104* 107* 104* 109*    Iron Studies:  Recent Labs    09/14/17 0459  IRON 84  TIBC 118*  FERRITIN >7,500*   Studies/Results: Ct Abdomen Pelvis Wo Contrast  Result Date: 09/15/2017 CLINICAL DATA:  62 year old male with a history of possible GI bleeding  and ascites EXAM: CT ABDOMEN AND PELVIS WITHOUT CONTRAST TECHNIQUE: Multidetector CT imaging of the abdomen and pelvis was performed following the standard protocol without IV contrast. COMPARISON:  CT 08/22/2017, 08/17/2017 FINDINGS: Lower chest: Mixed ground-glass and nodular opacity of the right lower lobe. Small bilateral pleural effusions with associated atelectasis. Hepatobiliary: Unremarkable appearance of liver parenchyma. Hyperdense material within the gallbladder lumen, new from the comparison. Pericholecystic fluid. No intrahepatic or extrahepatic biliary ductal dilatation. Pancreas: Unremarkable appearance of pancreas Spleen: Unremarkable spleen Adrenals/Urinary Tract: Unremarkable adrenal glands. Right kidney with no hydronephrosis or nephrolithiasis. Redemonstration of cyst of the posterior right kidney cortex. No left-sided hydronephrosis or nephrolithiasis. Hyperdense focus in the posterior left kidney cortex, unchanged. Urinary catheter within the urinary bladder which is decompressed. Gas within the urinary bladder, likely secondary to manipulation. Stomach/Bowel: Gastric tube terminates within the stomach lumen. No evidence of abnormally distended small bowel or colon. Ostomy within the right abdomen again demonstrated. Surgical changes at the right lower quadrant of prior bowel resection. Evaluation of small bowel and colon wall limited by the exclusion of contrast. There are no focal loops of small bowel or colon with hyperdense material that would indicate accumulating blood products. Vascular/Lymphatic: Scattered vascular calcifications. Small lymph nodes in the base of the mesentery and periaortic nodal stations, unchanged. There is diffuse missed the mesentery and hazy infiltration of the fat within the abdomen. Re-demonstration of infiltration of the lower chest and abdominal wall. Reproductive: Unremarkable pelvic structures. Other: Midline surgical changes. Since the prior CT, there has  been interval removal of the pigtail drainage catheter of the right lower quadrant, as well as the surgical drains of the upper abdomen. Musculoskeletal: No acute displaced fracture. Multilevel degenerative changes of the thoracic and lumbar spine. Schmorl's node of the lower L1 endplate. IMPRESSION: There is no CT finding that would confirm or exclude GI hemorrhage. If there is ongoing clinical concern, a nuclear medicine tagged red bowel study may be considered. Hyperdense material within the gallbladder lumen, may reflect vicarious excretion of contrast or microlithiasis. If there is concern for acute biliary obstruction, correlation with physical exam and HIDA study  may be considered. Diffuse abdominal and body wall edema/anasarca. Airspace disease of the right lower lobe, concerning for pneumonia. Interval removal of pigtail drainage catheter and surgical drains. Redemonstration of surgical changes along the midline abdomen and ostomy, status post bowel resection. Small bilateral pleural effusions. Electronically Signed   By: Gilmer Mor D.O.   On: 09/15/2017 10:13   Medications: Infusions: . sodium chloride    . sodium chloride    . sodium chloride    . feeding supplement (JEVITY 1.2 CAL) 1,000 mL (09/14/17 2111)  . fentaNYL infusion INTRAVENOUS Stopped (09/12/17 1056)  . heparin 2,000 Units/hr (09/15/17 1007)    Scheduled Medications: . darbepoetin (ARANESP) injection - NON-DIALYSIS  100 mcg Subcutaneous Q Fri-1800  . feeding supplement (PRO-STAT SUGAR FREE 64)  30 mL Per Tube BID  . furosemide  80 mg Intravenous BID  . insulin aspart  2-6 Units Subcutaneous Q4H  . mouth rinse  15 mL Mouth Rinse BID    have reviewed scheduled and prn medications.  Physical Exam: General: sleeping but alert, wants to eat - is now taking Pos (liquid)  in addition to  TF Heart: RRR Lungs: mostly clear Abdomen: ostomy with large output Extremities: pitting edema to dep areas but better      09/15/2017,10:49 AM  LOS: 5 days

## 2017-09-15 NOTE — Progress Notes (Signed)
ANTICOAGULATION CONSULT NOTE  Pharmacy Consult for heparin Indication: pulmonary embolus  No Known Allergies  Patient Measurements: Height: 5\' 10"  (177.8 cm) Weight: 159 lb 6.3 oz (72.3 kg) IBW/kg (Calculated) : 73 Heparin Dosing Weight: 68 kg  Vital Signs: Temp: 99 F (37.2 C) (01/27 0733) Temp Source: Oral (01/27 0733) BP: 117/75 (01/27 0733) Pulse Rate: 104 (01/27 0733)  Labs: Recent Labs    09/12/17 1158 09/12/17 1438  09/12/17 2201 09/13/17 0400  09/14/17 0459 09/14/17 1622 09/15/17 0744  HGB 8.0* 8.1*   < >  --  8.4*  --  8.6*  --  8.6*  HCT 23.4* 23.9*   < >  --  25.3*  --  25.2*  --  27.5*  PLT 156 158  --   --  168  --  175  --  185  HEPARINUNFRC 0.17*  --   --  0.32 0.31   < > 0.29* 0.28* 0.37  CREATININE  --  4.00*  --   --  4.21*  --  4.99*  --   --   TROPONINI 1.82* 1.62*  --  1.08*  --   --   --   --   --    < > = values in this interval not displayed.    Estimated Creatinine Clearance: 15.5 mL/min (A) (by C-G formula based on SCr of 4.99 mg/dL (H)).  Assessment: [name] on Heparin for r/o PE - now s[facility]p [facility]  Noted Bilateral lower extremities are positive for acute deep vein thrombosis involving bilateral posterior tibial and peroneal veins.  Heparin level therapeutic at 0.37.  Goal of Therapy:  Heparin level 0.3-0.7 units/ml Monitor platelets by anticoagulation protocol: Yes   Plan:  Heparin 2000 units/hr Heparin level daily wth CBC daily  Ladell PierBrooke Thurza Kwiecinski, PharmD Pharmacy Resident Pager: [phone] 09/15/2017 9:07 AM

## 2017-09-15 NOTE — Progress Notes (Signed)
Rehab Admissions Coordinator Note:  Patient was screened by Trish MageLogue, Bradey Luzier M for appropriateness for an Inpatient Acute Rehab Consult.  At this time, we are recommending Skilled Nursing Facility.  Trish MageLogue, Lyanna Blystone M 09/15/2017, 9:03 PM  I can be reached at [phone].

## 2017-09-15 NOTE — Progress Notes (Signed)
Asked by [facility] to review chart given pt having high ileostomy output Not uncommon to have high output ileostomy output at times.   First, place pt on fiber q day (psyllium) Second, scheduled imodium (titrate up to max for ongoing high output) If still having high output, then add lomotil and titrate up as necessary  High output ileostomy can cause renal issues May need replacement fluids until ostomy output <1L day.   pls call with addl questions Stop colace  Mary SellaEric M. Andrey CampanileWilson, MD, FACS General, Bariatric, & Minimally Invasive Surgery [facility], [place]

## 2017-09-16 ENCOUNTER — Other Ambulatory Visit: Payer: Self-pay

## 2017-09-16 ENCOUNTER — Inpatient Hospital Stay (HOSPITAL_COMMUNITY): Payer: Non-veteran care

## 2017-09-16 LAB — RENAL FUNCTION PANEL
ALBUMIN: 1.8 g/dL — AB (ref 3.5–5.0)
Anion gap: 14 (ref 5–15)
BUN: 65 mg/dL — AB (ref 6–20)
CALCIUM: 7.9 mg/dL — AB (ref 8.9–10.3)
CO2: 24 mmol/L (ref 22–32)
Chloride: 104 mmol/L (ref 101–111)
Creatinine, Ser: 5.26 mg/dL — ABNORMAL HIGH (ref 0.61–1.24)
GFR calc Af Amer: 12 mL/min — ABNORMAL LOW (ref >60)
GFR, EST NON AFRICAN AMERICAN: 10 mL/min — AB (ref >60)
Glucose, Bld: 116 mg/dL — ABNORMAL HIGH (ref 65–99)
PHOSPHORUS: 5 mg/dL — AB (ref 2.5–4.6)
POTASSIUM: 3.3 mmol/L — AB (ref 3.5–5.1)
Sodium: 142 mmol/L (ref 135–145)

## 2017-09-16 LAB — GLUCOSE, CAPILLARY
GLUCOSE-CAPILLARY: 121 mg/dL — AB (ref 65–99)
Glucose-Capillary: 104 mg/dL — ABNORMAL HIGH (ref 65–99)
Glucose-Capillary: 107 mg/dL — ABNORMAL HIGH (ref 65–99)
Glucose-Capillary: 111 mg/dL — ABNORMAL HIGH (ref 65–99)
Glucose-Capillary: 87 mg/dL (ref 65–99)
Glucose-Capillary: 94 mg/dL (ref 65–99)
Glucose-Capillary: 97 mg/dL (ref 65–99)

## 2017-09-16 LAB — CBC
HEMATOCRIT: 26 % — AB (ref 39.0–52.0)
HEMOGLOBIN: 8.3 g/dL — AB (ref 13.0–17.0)
MCH: 29.3 pg (ref 26.0–34.0)
MCHC: 31.9 g/dL (ref 30.0–36.0)
MCV: 91.9 fL (ref 78.0–100.0)
Platelets: 220 10*3/uL (ref 150–400)
RBC: 2.83 MIL/uL — ABNORMAL LOW (ref 4.22–5.81)
RDW: 15.4 % (ref 11.5–15.5)
WBC: 12.3 10*3/uL — AB (ref 4.0–10.5)

## 2017-09-16 LAB — PROCALCITONIN: PROCALCITONIN: 6.87 ng/mL

## 2017-09-16 LAB — HEPARIN LEVEL (UNFRACTIONATED): HEPARIN UNFRACTIONATED: 0.44 [IU]/mL (ref 0.30–0.70)

## 2017-09-16 MED ORDER — POTASSIUM CHLORIDE 20 MEQ/15ML (10%) PO SOLN
60.0000 meq | Freq: Once | ORAL | Status: AC
  Administered 2017-09-16: 60 meq via ORAL
  Filled 2017-09-16: qty 45

## 2017-09-16 MED ORDER — PSYLLIUM 95 % PO PACK
1.0000 | PACK | Freq: Three times a day (TID) | ORAL | Status: DC
  Administered 2017-09-16 – 2017-09-21 (×15): 1 via ORAL
  Filled 2017-09-16 (×16): qty 1

## 2017-09-16 MED ORDER — FENTANYL CITRATE (PF) 100 MCG/2ML IJ SOLN
50.0000 ug | INTRAMUSCULAR | Status: DC | PRN
Start: 2017-09-16 — End: 2017-09-18
  Administered 2017-09-17 – 2017-09-18 (×4): 50 ug via INTRAVENOUS
  Administered 2017-09-18: 100 ug via INTRAVENOUS
  Filled 2017-09-16 (×5): qty 2

## 2017-09-16 MED ORDER — SODIUM CHLORIDE 0.45 % IV SOLN
INTRAVENOUS | Status: DC
  Administered 2017-09-16 – 2017-09-17 (×5): via INTRAVENOUS

## 2017-09-16 MED ORDER — OXYCODONE HCL 5 MG PO TABS
5.0000 mg | ORAL_TABLET | ORAL | Status: DC | PRN
  Administered 2017-09-16 – 2017-09-20 (×11): 5 mg via ORAL
  Filled 2017-09-16 (×11): qty 1

## 2017-09-16 MED ORDER — PRO-STAT SUGAR FREE PO LIQD
30.0000 mL | Freq: Two times a day (BID) | ORAL | Status: DC
  Administered 2017-09-18 – 2017-09-19 (×2): 30 mL via ORAL
  Filled 2017-09-16 (×5): qty 30

## 2017-09-16 MED ORDER — LOPERAMIDE HCL 2 MG PO CAPS
4.0000 mg | ORAL_CAPSULE | Freq: Four times a day (QID) | ORAL | Status: DC
  Administered 2017-09-16 – 2017-09-21 (×22): 4 mg via ORAL
  Filled 2017-09-16 (×22): qty 2

## 2017-09-16 NOTE — Progress Notes (Signed)
Assessment/Plan:62 year old black male with previous acute kidney injury requiring dialysis in December 2018.He now presents with worsening renal function in the setting of PEA arrest with prolonged hypotension requiring pressors 1. AKI, nonoliguric-acute on chronic kidney injury. Creatinine was noted to be 1.1 in September 2017. After AKI in December it appears his creatinine nadir was around 2. 2. Hypertension/volume- dry...needs volume Will give IVFs.  Needs aggressive treatment of GI output 3.Hypokalemic, will supp 4. Anemia -has been low. Transfuse as needed.  iron stores good - gave darbe Sat   Subjective: Interval History:  Wants PO fluids  Objective: Vital signs in last 24 hours: Temp:  [98 F (36.7 C)-99.2 F (37.3 C)] 98.3 F (36.8 C) (01/28 0803) Pulse Rate:  [96-108] 96 (01/28 0803) Resp:  [17-28] 22 (01/28 0803) BP: (114-136)/(73-83) 136/83 (01/28 0803) SpO2:  [97 %-100 %] 100 % (01/28 0803) Weight:  [69.8 kg (153 lb 14.1 oz)] 69.8 kg (153 lb 14.1 oz) (01/28 0439) Weight change: -2.5 kg (-8.2 oz)  Intake/Output from previous day: 01/27 0701 - 01/28 0700 In: 2651.2 [P.O.:220; I.V.:367.3; NG/GT:2063.8] Out: 6450 [Urine:1200; Stool:5250] Intake/Output this shift: Total I/O In: -  Out: 825 [Urine:250; Stool:575]  General appearance: alert and cooperative Head: Normocephalic, without obvious abnormality, atraumatic, sunken features Resp: clear to auscultation bilaterally Chest wall: no tenderness Abd colostomy RLQ Cardio: regular rate and rhythm, S1, S2 normal, no murmur, click, rub or gallop Extremities: extremities normal, atraumatic, no cyanosis or edema  Lab Results: Recent Labs    09/15/17 0744 09/16/17 0651  WBC 14.5* 12.3*  HGB 8.6* 8.3*  HCT 27.5* 26.0*  PLT 185 220   BMET:  Recent Labs    09/15/17 0744 09/16/17 0651  NA 143 142  K 3.2* 3.3*  CL 103 104  CO2 24 24  GLUCOSE 126* 116*  BUN 63* 65*  CREATININE 5.54* 5.26*  CALCIUM  7.7* 7.9*   No results for input(s): PTH in the last 72 hours. Iron Studies:  Recent Labs    09/14/17 0459  IRON 84  TIBC 118*  FERRITIN >7,500*   Studies/Results: Ct Abdomen Pelvis Wo Contrast  Result Date: 09/15/2017 CLINICAL DATA:  62 year old male with a history of possible GI bleeding and ascites EXAM: CT ABDOMEN AND PELVIS WITHOUT CONTRAST TECHNIQUE: Multidetector CT imaging of the abdomen and pelvis was performed following the standard protocol without IV contrast. COMPARISON:  CT 08/22/2017, 08/17/2017 FINDINGS: Lower chest: Mixed ground-glass and nodular opacity of the right lower lobe. Small bilateral pleural effusions with associated atelectasis. Hepatobiliary: Unremarkable appearance of liver parenchyma. Hyperdense material within the gallbladder lumen, new from the comparison. Pericholecystic fluid. No intrahepatic or extrahepatic biliary ductal dilatation. Pancreas: Unremarkable appearance of pancreas Spleen: Unremarkable spleen Adrenals/Urinary Tract: Unremarkable adrenal glands. Right kidney with no hydronephrosis or nephrolithiasis. Redemonstration of cyst of the posterior right kidney cortex. No left-sided hydronephrosis or nephrolithiasis. Hyperdense focus in the posterior left kidney cortex, unchanged. Urinary catheter within the urinary bladder which is decompressed. Gas within the urinary bladder, likely secondary to manipulation. Stomach/Bowel: Gastric tube terminates within the stomach lumen. No evidence of abnormally distended small bowel or colon. Ostomy within the right abdomen again demonstrated. Surgical changes at the right lower quadrant of prior bowel resection. Evaluation of small bowel and colon wall limited by the exclusion of contrast. There are no focal loops of small bowel or colon with hyperdense material that would indicate accumulating blood products. Vascular/Lymphatic: Scattered vascular calcifications. Small lymph nodes in the base of the mesentery and  periaortic nodal stations, unchanged. There is diffuse missed the mesentery and hazy infiltration of the fat within the abdomen. Re-demonstration of infiltration of the lower chest and abdominal wall. Reproductive: Unremarkable pelvic structures. Other: Midline surgical changes. Since the prior CT, there has been interval removal of the pigtail drainage catheter of the right lower quadrant, as well as the surgical drains of the upper abdomen. Musculoskeletal: No acute displaced fracture. Multilevel degenerative changes of the thoracic and lumbar spine. Schmorl's node of the lower L1 endplate. IMPRESSION: There is no CT finding that would confirm or exclude GI hemorrhage. If there is ongoing clinical concern, a nuclear medicine tagged red bowel study may be considered. Hyperdense material within the gallbladder lumen, may reflect vicarious excretion of contrast or microlithiasis. If there is concern for acute biliary obstruction, correlation with physical exam and HIDA study may be considered. Diffuse abdominal and body wall edema/anasarca. Airspace disease of the right lower lobe, concerning for pneumonia. Interval removal of pigtail drainage catheter and surgical drains. Redemonstration of surgical changes along the midline abdomen and ostomy, status post bowel resection. Small bilateral pleural effusions. Electronically Signed   By: Gilmer Mor D.O.   On: 09/15/2017 10:13    Scheduled: . darbepoetin (ARANESP) injection - NON-DIALYSIS  100 mcg Subcutaneous Q Fri-1800  . feeding supplement (PRO-STAT SUGAR FREE 64)  30 mL Per Tube BID  . insulin aspart  2-6 Units Subcutaneous Q4H  . loperamide  4 mg Oral QID  . mouth rinse  15 mL Mouth Rinse BID  . psyllium  1 packet Oral TID   Continuous: . sodium chloride    . sodium chloride    . sodium chloride    . feeding supplement (JEVITY 1.2 CAL) 1,000 mL (09/16/17 0151)  . fentaNYL infusion INTRAVENOUS Stopped (09/12/17 1056)  . heparin 2,000 Units/hr  (09/16/17 0517)      LOS: 6 days   [name] 09/16/2017,8:19 AM

## 2017-09-16 NOTE — Progress Notes (Signed)
ANTICOAGULATION CONSULT NOTE - Follow Up Consult  Pharmacy Consult for Heparin Indication: pulmonary embolus and DVT  No Known Allergies  Patient Measurements: Height: 5\' 10"  (177.8 cm) Weight: 153 lb 14.1 oz (69.8 kg) IBW/kg (Calculated) : 73 Heparin Dosing Weight: 69.8 kg  Vital Signs: Temp: 98.3 F (36.8 C) (01/28 0803) Temp Source: Oral (01/28 0803) BP: 136/83 (01/28 0803) Pulse Rate: 96 (01/28 0803)  Labs: Recent Labs    09/14/17 0459 09/14/17 1622 09/15/17 0744 09/16/17 0651  HGB 8.6*  --  8.6* 8.3*  HCT 25.2*  --  27.5* 26.0*  PLT 175  --  185 220  HEPARINUNFRC 0.29* 0.28* 0.37 0.44  CREATININE 4.99*  --  5.54* 5.26*    Estimated Creatinine Clearance: 14.2 mL/min (A) (by C-G formula based on SCr of 5.26 mg/dL (H)).  Assessment:   62 yr old male continues on IV heparin for PE and bilateral DVT.   S/p PEA arrest x 2, received TNKase on 09/10/17.    Heparin level remains therapeutic (0.44) on 2000 units/hr. Hgb low stable. No bleeding reported.   Goal of Therapy:  Heparin level 0.3-0.7 units/ml Monitor platelets by anticoagulation protocol: Yes   Plan:   Continue heparin drip at 2000 units/hr.  Daily heparin level and CBC.  [name], Datha Kissinger [place], [place] Pager: [phone] or [phone] 09/16/2017,12:25 PM

## 2017-09-16 NOTE — Consult Note (Signed)
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    Tim Corriher [facility] MSN,RN,CWOCN, CNS, [employer] [phone]

## 2017-09-16 NOTE — Progress Notes (Signed)
Spoke with Dr. Rito EhrlichKrishnan about pt's right arm continuing to swell. Dr. Rito EhrlichKrishnan stated he was aware. Keep elevated. Cont to monitor. Emelda Brothershristy Keidan Aumiller RN

## 2017-09-16 NOTE — Progress Notes (Signed)
Modified Barium Swallow Progress Note  Patient Details  Name: Caleb Singh MRN: [id number] Date of Birth: [birth year]  Today's Date: 09/16/2017  Modified Barium Swallow completed.  Full report located under Chart Review in the Imaging Section.  Brief recommendations include the following:  Clinical Impression  Pt presents with mild-moderate pharyngeal dysphagia characterized by penetration/aspiration of nectar thick and thin liquids and moderate residue due to generalized weakness of swallowing musculature, reduced airway closure, and inadequate clearance through the UES. Pt's oral phase was mildly impaired by anterior mastication, premature swallow, and piecemeal swallow. Mod verbal cues to clear throat and repeat swallow were used to clear airway of penetration and some aspiration with all liquids. Chin tuck was attempted with thin liquids, but proven not effective given aspiration event. Pt reduced pharyngeal residue using spontaneous and cued multiple swallows. Residue at the UES and pyriform sinus likely impacted by reduced pharyngeal peristalsis and reduced UES opening given presence of large NG tube. Therefore, removal of NG tube, when medically appropriate, will likely benefit swallow function. Given pt's oral phase deficits, pharyngeal residue, and reduced airway protection, recommend thin liquid and Dysphagia 2 (chopped) diet. Aspiration precautions include small/slow bites/sips, intermittent throat clear with sips of liquid, multiple swallows, and siting up-right for oral intake. Pt swallow function likely to improve with removal of NG tube and reconditioning of musculature as pt resumes diet. SLP will follow up to monitor pt's safety with diet and potential for advancement.      Swallow Evaluation Recommendations       SLP Diet Recommendations: Dysphagia 2 (Fine chop) solids;Thin liquid   Liquid Administration via: Straw   Medication Administration: Crushed with puree   Supervision: Full supervision/cueing for compensatory strategies;Staff to assist with self feeding   Compensations: Slow rate;Small sips/bites;Multiple dry swallows after each bite/sip;Clear throat intermittently   Postural Changes: Seated upright at 90 degrees   Oral Care Recommendations: Oral care BID      [place] Nieshia Larmon SLP Student Clinician   [place] Jaivion Kingsley 09/16/2017,4:14 PM

## 2017-09-16 NOTE — Progress Notes (Signed)
[facility] PROGRESS NOTE  [employer] WUJ:[id number] DOB: Mar 20, 1956 DOA: 09/10/2017  PCP: Patient, No Pcp Per  Brief History/Interval Summary: 62 year old Caucasian male with past medical history of smoking history of PE who suffered from a perforated small bowel in December for which he underwent small bowel resection complicated by multisystem failure requiring CRRT.  Course was also complicated by E. coli bacteremia, respiratory failure, brief arrest and intra-abdominal abscess which was drained percutaneously.  He was discharged on 1/12 to rehab.  On 1/22 started having worsening shortness of breath and was admitted for the same.  He was undergoing workup to rule out PE and while he was in VQ scan he had worsening dyspnea with a rapid decline and then had PEA arrest.  He received systemic TPA due to strong suspicion for pulmonary embolism.  Transthoracic echocardiogram was suspicious for right atrial and IVC blood clot.  He was admitted to the intensive care unit.  Stabilized and then transferred to the floor.  Consultants: Pulmonology.  Cardiology.  Nephrology.   STUDIES:  Echo 1/22>>>EF 65-70%, grade 1 diastolic dysfunction, large RA clot measuring 4.63cm x 1cm which is protruding through theTV and is consistent with very large thrombus. The atrium wasmoderately dilated. Severe RV dysfunction, suspicion of clot in IVC Echo 1/23>>> thrombus in RA no longer seen, EF 60-65%, severely dilated RV CT abd pelvis 1/24>>>  CULTURES: BC x 2 1/22>>>  Urine 1/22>>>   ANTIBIOTICS: Meropenem 1/22>>>   SIGNIFICANT EVENTS: 1/22>>> PEA arrest, TPA  LINES/TUBES: ETT 1/22>>>  L fem CVL 1/22>>>  L fem Aline 1/22>>>    Subjective/Interval History: Patient continues to have a cough but not as much as before.  Denies any chest pain.  Continues to be concerned about swelling in his has been tolerating taking orally.  Denies any nausea or vomiting.     ROS: Denies any  headaches  Objective:  Vital Signs  Vitals:   09/15/17 2026 09/16/17 0046 09/16/17 0439 09/16/17 0803  BP: 122/73 123/78 132/79 136/83  Pulse: (!) 103 (!) 108 (!) 107 96  Resp: (!) 26 (!) 28 17 (!) 22  Temp: 99.2 F (37.3 C) 98.3 F (36.8 C) 99.1 F (37.3 C) 98.3 F (36.8 C)  TempSrc: Oral Oral Oral Oral  SpO2: 100% 100% 100% 100%  Weight:   69.8 kg (153 lb 14.1 oz)   Height:        Intake/Output Summary (Last 24 hours) at 09/16/2017 0923 Last data filed at 09/16/2017 0703 Gross per 24 hour  Intake 2651.17 ml  Output 7275 ml  Net -4623.83 ml   Filed Weights   09/14/17 0522 09/15/17 0410 09/16/17 0439  Weight: 75.9 kg (167 lb 4.8 oz) 72.3 kg (159 lb 6.3 oz) 69.8 kg (153 lb 14.1 oz)    General appearance: Awake alert.  In no distress Resp: Diminished entry at the bases.  No wheezing rales or rhonchi.  Normal effort at rest. Cardio: S1-S2 is normal regular.  No S3-S4.  No rubs murmurs or bruit GI: Continues to have large amounts of yellowish to in his ostomy bag.  No blood.  Abdomen otherwise nontender.  Dressing noted over the incision site.    Extremities: Edema is present bilateral lower extremities as well as in the upper extremities. Neurologic: No focal deficits.  Lab Results:  Data Reviewed: I have personally reviewed following labs and imaging studies  CBC: Recent Labs  Lab 09/11/17 0042  09/12/17 1438 09/12/17 2030 09/13/17 0400 09/14/17 0459 09/15/17 9562  09/16/17 0651  WBC 11.3*   < > 14.7*  --  15.8* 15.8* 14.5* 12.3*  NEUTROABS 8.2*  --   --   --   --   --   --   --   HGB 5.1*   < > 8.1* 8.8* 8.4* 8.6* 8.6* 8.3*  HCT 16.0*   < > 23.9* 26.0* 25.3* 25.2* 27.5* 26.0*  MCV 90.4   < > 86.3  --  87.2 88.7 90.8 91.9  PLT 197   < > 158  --  168 175 185 220   < > = values in this interval not displayed.    Basic Metabolic Panel: Recent Labs  Lab 09/12/17 0404 09/12/17 1438 09/12/17 2030 09/13/17 0400 09/13/17 1050 09/13/17 1905 09/14/17 0459  09/15/17 0744 09/16/17 0651  NA 137 139 137 138  --   --  139 143 142  K 3.7 3.8 4.0 3.7  --   --  3.2* 3.2* 3.3*  CL 91* 91*  --  92*  --   --  97* 103 104  CO2 29 31  --  30  --   --  27 24 24   GLUCOSE 120* 106*  --  93  --   --  119* 126* 116*  BUN 54* 47*  --  54*  --   --  55* 63* 65*  CREATININE 3.71* 4.00*  --  4.21*  --   --  4.99* 5.54* 5.26*  CALCIUM 6.4* 6.4*  --  6.6*  --   --  7.0* 7.7* 7.9*  MG 1.7  --   --  1.7 1.8 2.0 1.9  --   --   PHOS 5.4*  --   --   --  6.6* 6.6* 5.9*  6.0* 5.7* 5.0*    GFR: Estimated Creatinine Clearance: 14.2 mL/min (A) (by C-G formula based on SCr of 5.26 mg/dL (H)).  Liver Function Tests: Recent Labs  Lab 09/10/17 1123 09/11/17 0042 09/11/17 0541 09/12/17 0404 09/13/17 0400 09/14/17 0459 09/15/17 0744 09/16/17 0651  AST 29 195* 1,631* 4,320* 1,411*  --   --   --   ALT 20 158* 1,425* 2,507* 1,701*  --   --   --   ALKPHOS 193* 109 165* 170* 185*  --   --   --   BILITOT 0.8 0.8 1.5* 1.2 1.4*  --   --   --   PROT 8.8* 4.0* 5.3* 4.7* 5.2*  --   --   --   ALBUMIN 2.7* 1.1* 1.5* 1.5* 1.7* 1.6* 1.7* 1.8*    Coagulation Profile: Recent Labs  Lab 09/11/17 0042  INR 4.02*    Cardiac Enzymes: Recent Labs  Lab 09/11/17 1439 09/11/17 2155 09/12/17 1158 09/12/17 1438 09/12/17 2201  TROPONINI 5.18* 4.42* 1.82* 1.62* 1.08*    CBG: Recent Labs  Lab 09/15/17 1645 09/15/17 2005 09/16/17 0045 09/16/17 0438 09/16/17 0801  GLUCAP 94 104* 111* 94 121*    Anemia Panel: Recent Labs    09/14/17 0459  FERRITIN >7,500*  TIBC 118*  IRON 84    Recent Results (from the past 240 hour(s))  Blood culture (routine x 2)     Status: None   Collection Time: 09/10/17 12:14 PM  Result Value Ref Range Status   Specimen Description BLOOD RIGHT FOREARM  Final   Special Requests   Final    BOTTLES DRAWN AEROBIC AND ANAEROBIC Blood Culture adequate volume   Culture NO GROWTH 5 DAYS  Final  Report Status 09/15/2017 FINAL  Final  Blood  culture (routine x 2)     Status: None   Collection Time: 09/10/17 12:30 PM  Result Value Ref Range Status   Specimen Description BLOOD RIGHT ANTECUBITAL  Final   Special Requests   Final    BOTTLES DRAWN AEROBIC AND ANAEROBIC Blood Culture adequate volume   Culture NO GROWTH 5 DAYS  Final   Report Status 09/15/2017 FINAL  Final  Urine culture     Status: None   Collection Time: 09/12/17 12:48 AM  Result Value Ref Range Status   Specimen Description URINE, RANDOM  Final   Special Requests NONE  Final   Culture NO GROWTH  Final   Report Status 09/13/2017 FINAL  Final  MRSA PCR Screening     Status: None   Collection Time: 09/12/17  3:29 PM  Result Value Ref Range Status   MRSA by PCR NEGATIVE NEGATIVE Final    Comment:        The GeneXpert MRSA Assay (FDA approved for NASAL specimens only), is one component of a comprehensive MRSA colonization surveillance program. It is not intended to diagnose MRSA infection nor to guide or monitor treatment for MRSA infections.       Radiology Studies: Ct Abdomen Pelvis Wo Contrast  Result Date: 09/15/2017 CLINICAL DATA:  62 year old male with a history of possible GI bleeding and ascites EXAM: CT ABDOMEN AND PELVIS WITHOUT CONTRAST TECHNIQUE: Multidetector CT imaging of the abdomen and pelvis was performed following the standard protocol without IV contrast. COMPARISON:  CT 08/22/2017, 08/17/2017 FINDINGS: Lower chest: Mixed ground-glass and nodular opacity of the right lower lobe. Small bilateral pleural effusions with associated atelectasis. Hepatobiliary: Unremarkable appearance of liver parenchyma. Hyperdense material within the gallbladder lumen, new from the comparison. Pericholecystic fluid. No intrahepatic or extrahepatic biliary ductal dilatation. Pancreas: Unremarkable appearance of pancreas Spleen: Unremarkable spleen Adrenals/Urinary Tract: Unremarkable adrenal glands. Right kidney with no hydronephrosis or nephrolithiasis.  Redemonstration of cyst of the posterior right kidney cortex. No left-sided hydronephrosis or nephrolithiasis. Hyperdense focus in the posterior left kidney cortex, unchanged. Urinary catheter within the urinary bladder which is decompressed. Gas within the urinary bladder, likely secondary to manipulation. Stomach/Bowel: Gastric tube terminates within the stomach lumen. No evidence of abnormally distended small bowel or colon. Ostomy within the right abdomen again demonstrated. Surgical changes at the right lower quadrant of prior bowel resection. Evaluation of small bowel and colon wall limited by the exclusion of contrast. There are no focal loops of small bowel or colon with hyperdense material that would indicate accumulating blood products. Vascular/Lymphatic: Scattered vascular calcifications. Small lymph nodes in the base of the mesentery and periaortic nodal stations, unchanged. There is diffuse missed the mesentery and hazy infiltration of the fat within the abdomen. Re-demonstration of infiltration of the lower chest and abdominal wall. Reproductive: Unremarkable pelvic structures. Other: Midline surgical changes. Since the prior CT, there has been interval removal of the pigtail drainage catheter of the right lower quadrant, as well as the surgical drains of the upper abdomen. Musculoskeletal: No acute displaced fracture. Multilevel degenerative changes of the thoracic and lumbar spine. Schmorl's node of the lower L1 endplate. IMPRESSION: There is no CT finding that would confirm or exclude GI hemorrhage. If there is ongoing clinical concern, a nuclear medicine tagged red bowel study may be considered. Hyperdense material within the gallbladder lumen, may reflect vicarious excretion of contrast or microlithiasis. If there is concern for acute biliary obstruction, correlation with physical exam  and HIDA study may be considered. Diffuse abdominal and body wall edema/anasarca. Airspace disease of the right  lower lobe, concerning for pneumonia. Interval removal of pigtail drainage catheter and surgical drains. Redemonstration of surgical changes along the midline abdomen and ostomy, status post bowel resection. Small bilateral pleural effusions. Electronically Signed   By: Gilmer MorJaime  Wagner D.O.   On: 09/15/2017 10:13     Medications:  Scheduled: . darbepoetin (ARANESP) injection - NON-DIALYSIS  100 mcg Subcutaneous Q Fri-1800  . feeding supplement (PRO-STAT SUGAR FREE 64)  30 mL Per Tube BID  . insulin aspart  2-6 Units Subcutaneous Q4H  . loperamide  4 mg Oral QID  . mouth rinse  15 mL Mouth Rinse BID  . potassium chloride  60 mEq Oral Once  . psyllium  1 packet Oral TID   Continuous: . sodium chloride    . sodium chloride    . sodium chloride    . sodium chloride    . feeding supplement (JEVITY 1.2 CAL) 1,000 mL (09/16/17 0151)  . fentaNYL infusion INTRAVENOUS Stopped (09/12/17 1056)  . heparin 2,000 Units/hr (09/16/17 0517)   ZOX:WRUEAVPRN:sodium chloride, albuterol, benzonatate, fentaNYL (SUBLIMAZE) injection, ondansetron (ZOFRAN) IV  Assessment/Plan:   Acute hypoxic respiratory failure  Most likely secondary to massive PE.  Patient was intubated in the intensive care unit.  Extubated on 1/24.  Seems to be stable from a respiratory standpoint.  Chest x-ray did show bibasilar opacities.  Thought to be atelectasis though infection cannot be ruled out.  CT scan done of the abdomen and pelvis showed airspace disease of the right lung.  Small pleural effusions were noted.  It is quite possible he may have aspirated as well.  These changes could also be due to pulmonary embolism.  WBC is noted to be high but he is afebrile.  He seems to be stable from a respiratory standpoint without any new symptoms.  Continue to monitor for now.  We will leave him off of antibiotics.  Shock - cardiogenic/PEA arrest Resolved. Has been off of pressors.   Suspect massive pulmonary embolism/RA/IVC thrombus//acute lower  extremity DVT F/u echo did no longer visualized RA thrombus.  DVT noted bilateral lower extremities. S/p systemic thrombolytics 1/22. Holding home norvasc. Heparin gtt per pharmacy. Monitor closely for bleeding.  He will need definitive anticoagulation once his other medical issues have stabilized.  If his renal function does not improve then warfarin may be the only.  Elevated troponin/history of bradyarrhythmia Patient seen by cardiology for elevated troponin.  Thought to be secondary to all of the other underlying issues.  No evidence for ischemia.  Previous admission he did require temporary pacing wire.  No bradyarrhythmias noted during this hospitalization yet.  AKI on CKD Baseline Scr ~1.9-2.1.  Treatment significantly elevated though stable today compared to yesterday.  Nephrology is following.  Some of this is likely due to high ostomy output.  Nephrology plans to give him IV fluids.  Discussed with Dr. Lowell GuitarPowell today.  He has required CRRT during his previous hospitalization.    Transaminitis Most likely due to shock liver.  LFTs have been improving.  Monitor periodically.  Will check labs tomorrow.  Recent perforated small bowel 12/24 with intra-abd abscess/high ostomy output Patient noted to have significant output from his ostomy.  Loose stool.  Nonbloody.  CT of abdomen pelvis does not show anything concerning.  No intra-abdominal fluid collection noted.  Reason for high output from his ostomy not clear.  Could be due to  short gut syndrome.  Discussed with general surgery yesterday.  They recommend fiber and Imodium to begin with.  This has been initiated.  If this does not control his output then Lomotil will be added.  May also need to consider octreotide.  No bleeding noted.  Normocytic anemia with likely component of acute blood loss  Hemoglobin has been stable.  No overt bleeding is noted.  Continue to monitor closely.    Recent hospitalization for intra-abdominal abscess and E.  coli bacteremia  Blood cultures have been negative however patient's pro calcitonin was noted to be significantly elevated at 50.13.  Repeat levels show improvement to 6.87 today.  Has been empirically on meropenem.  CT scan does not show any infectious etiology.  Blood cultures have been negative so far.  Antibiotics were discontinued on 1/27.  Monitor clinically.  Hyperglycemia   SSI. CBG's stable.  Acute metabolic encephalopathy Appears to be improving.  No focal neurological deficits. PT and OT following.  Dysphagia/Nutrition Was placed on full liquid diet by speech therapy.  If his diet is advanced and if he has adequate caloric intake then hopefully his tube feedings can be discontinued soon.  Malnutrition of moderate degree Currently on tube feedings.  Also placed on full liquid diet.   DVT Prophylaxis: On IV heparin    Code Status: Partial code Family Communication: No family at bedside Disposition Plan: Management as outlined above.  Await improvement in renal function and dysphagia.  He will likely need some form of rehab.    LOS: 6 days   Osvaldo Shipper  [facility] Pager [phone] 09/16/2017, 9:23 AM  If 7PM-7AM, please contact night-coverage at [email or web address], password [facility]

## 2017-09-16 NOTE — Plan of Care (Signed)
Pt oriented to unit, plan of care, and method of reporting concerns. Verbalizes understanding of need to call for assistance prior to ambulation. Bed alarm implemented for safety. Call light and bedside table within reach.

## 2017-09-16 NOTE — Progress Notes (Signed)
RN spoke with attending Dr. Rito EhrlichKrishnan about removing foley. Stated to keep it in another day or so. Patient c/o right arm pain. RN assessed and noted swelling. Removed IV fluids that were running into the arm and switched to other. Elevated extremity and applied heat pack. Will continue to monitor.

## 2017-09-16 NOTE — Progress Notes (Signed)
  Speech Language Pathology Treatment: Dysphagia  Patient Details Name: Caleb Singh MRN: [id number] DOB: June 27, 1956 Today's Date: 09/16/2017 Time: [id number] SLP Time Calculation (min) (ACUTE ONLY): 19 min  Assessment / Plan / Recommendation Clinical Impression  Pt presents with increased risk of aspiration given overt signs of aspiration (immediate cough, wet vocal quality, and delayed throat clear) during PO trials of thin liquid despite min cues from SLP for safety precautions. However, pt reported no difficulty with current full liquids diet. Given observed inconsistent signs of aspiration, recommend objective look at swallow function to ensure airway protection and appropriate diet consistency. Recommend continued full liquid diet for now, with MBS scheduled for 2:00 today.       HPI HPI: Pt is a 62 y.o.malewith PMH of PE, tobacco abuse, and recent hospitalization for small bowel perforation from 07/23/2017-08/31/2017 complicated by multisystem failure. Discharged to a SNF 08/31/17, however returned 09/10/17 with DOE and chest tightness, with subsequent respiratory failure from presumed massive PE, with PEA arrest x2 (1/22). Pt intubated 1/22 to 1/24. Failed post-extubation screen. CXR (1/24) revealing interval increase in bibasilar airspace disease and associated effusions. While this may represent atelectasis, infection is not excluded. Findings are worse on the right.       SLP Plan  New goals to be determined pending instrumental study       Recommendations  Diet recommendations: Thin liquid(updated recommendations pending MBS) Liquids provided via: Straw Medication Administration: Crushed with puree Supervision: Patient able to self feed Compensations: Slow rate;Small sips/bites;Minimize environmental distractions Postural Changes and/or Swallow Maneuvers: Seated upright 90 degrees                Oral Care Recommendations: Oral care BID Follow up Recommendations: Skilled  Nursing facility SLP Visit Diagnosis: Dysphagia, pharyngeal phase (R13.13) Plan: New goals to be determined pending instrumental study       GO              Caleb Singh Cameka Singh SLP Student Clinician  Caleb Singh Caleb Singh 09/16/2017, 1:42 PM

## 2017-09-16 NOTE — Progress Notes (Signed)
   09/16/17 1100  Clinical Encounter Type  Visited With Patient  Visit Type Follow-up  Referral From Nurse  Consult/Referral To Chaplain  Spiritual Encounters  Spiritual Needs Prayer;Emotional  Stress Factors  Patient Stress Factors Major life changes   Followed up on [facility] for major life transition.  Had met the family while on call last week.  Patient had a need to talk to a chaplain.  Was able to share I was with his family last week. He shared about his past life style with alcohol and a short stint being homeless.  He has a good outlook and wants to get better, but realizes he may not be able to live alone.  He shared about his faith and how much his faith has given him strength.  Asked patient if he wanted a Bible and he said, yes.  Will provide.  Prayed together.  Will follow as needed. Chaplain Katherene Ponto

## 2017-09-16 NOTE — NC FL2 (Signed)
[place] MEDICAID FL2 LEVEL OF CARE SCREENING TOOL     IDENTIFICATION  Patient Name: Caleb LaundryFrederick Pannone Birthdate: 1955/09/01 Sex: male Admission Date (Current Location): 09/10/2017  [facility] [place]   Facility and Address:  [facility], [address], [place], [place] [address]      Provider Number: [id number]  Attending Physician Name and Address:  Osvaldo ShipperKrishnan, Gokul, MD  Relative Name and Phone Number:  Gorden HarmsDesiree Davis, sister, [phone]    Current Level of Care: Hospital Recommended Level of Care: Skilled Nursing Facility Prior Approval Number:    Date Approved/Denied:   PASRR Number: [id number]  Discharge Plan: SNF    Current Diagnoses: Patient Active Problem List   Diagnosis Date Noted  . Malnutrition of moderate degree 09/14/2017  . Lactic acidosis 09/10/2017  . Protein-calorie malnutrition, severe 08/28/2017  . Abdominopelvic abscess (HCC)   . Bradycardia   . Postoperative intra-abdominal abscess   . Acute encephalopathy   . DNR (do not resuscitate) discussion   . Palliative care by specialist   . Weakness generalized   . Septic shock (HCC)   . Pressure injury of skin 08/07/2017  . Tachypnea   . Acute delirium   . Intestinal perforation s/p SB resection/ileostomy 07/24/2017 & 07/25/2017 07/27/2017  . 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 Acute respiratory failure (HCC)   . AKI (acute kidney injury) (HCC)   . Free intraperitoneal air 07/23/2017  . Abscess of third finger of right hand 04/17/2016    Orientation RESPIRATION BLADDER Height & Weight     Self, Time, Situation, Place  Normal Incontinent, Indwelling catheter Weight: 153 lb 14.1 oz (69.8 kg) Height:  5\' 10"  (177.8 cm)  BEHAVIORAL SYMPTOMS/MOOD NEUROLOGICAL BOWEL NUTRITION STATUS      Incontinent, Ileostomy NG/panda(cortrak)  AMBULATORY STATUS COMMUNICATION OF NEEDS Skin   Extensive Assist Verbally PU Stage and Appropriate Care, Surgical  wounds(PU stage II scrotum; open wound abdomen)                       Personal Care Assistance Level of Assistance  Bathing, Feeding, Dressing Bathing Assistance: Maximum assistance Feeding assistance: Independent Dressing Assistance: Maximum assistance     Functional Limitations Info  Sight, Hearing, Speech Sight Info: Adequate Hearing Info: Adequate Speech Info: Adequate    SPECIAL CARE FACTORS FREQUENCY  PT (By licensed PT), OT (By licensed OT)     PT Frequency: 5x/week OT Frequency: 5x/week            Contractures Contractures Info: Not present    Additional Factors Info  Code Status, Allergies Code Status Info: Partial Allergies Info: No Known Allergies   Insulin Sliding Scale Info: insulin Q4 hrs       Current Medications (09/16/2017):  This is the current hospital active medication list Current Facility-Administered Medications  Medication Dose Route Frequency Provider Last Rate Last Dose  . 0.45 % sodium chloride infusion   Intravenous Continuous Lauris PoagPowell, Alvin C, MD 150 mL/hr at 09/16/17 0950    . 0.9 %  sodium chloride infusion   Intravenous Once Hammonds, Curt JewsKathleen H, MD      . 0.9 %  sodium chloride infusion   Intravenous Once Chana BodeWinfrey, William B, MD      . 0.9 %  sodium chloride infusion  250 mL Intravenous PRN Annie SableGoldsborough, Kellie, MD      . albuterol (PROVENTIL) (2.5 MG/3ML) 0.083% nebulizer solution 2.5 mg  2.5 mg Nebulization Q2H PRN Danford BadWhiteheart, Kathryn  A, NP      . benzonatate (TESSALON) capsule 100 mg  100 mg Oral TID PRN Audrea Muscat T, NP   100 mg at 09/15/17 2255  . Darbepoetin Alfa (ARANESP) injection 100 mcg  100 mcg Subcutaneous Q [id number] Annie Sable, MD   100 mcg at 09/13/17 1720  . feeding supplement (JEVITY 1.2 CAL) liquid 1,000 mL  1,000 mL Per Tube Continuous Alyson Reedy, MD 70 mL/hr at 09/16/17 0151 1,000 mL at 09/16/17 0151  . feeding supplement (PRO-STAT SUGAR FREE 64) liquid 30 mL  30 mL Per Tube BID Alyson Reedy,  MD   30 mL at 09/16/17 1008  . fentaNYL (SUBLIMAZE) injection 50-100 mcg  50-100 mcg Intravenous Q1H PRN Gwynn Burly, DO   100 mcg at 09/12/17 0930  . heparin ADULT infusion 100 units/mL (25000 units/248mL sodium chloride 0.45%)  2,000 Units/hr Intravenous Continuous Baggett, Brooke A, RPH 20 mL/hr at 09/16/17 0517 2,000 Units/hr at 09/16/17 0517  . insulin aspart (novoLOG) injection 2-6 Units  2-6 Units Subcutaneous Q4H Hammonds, Curt Jews, MD   2 Units at 09/16/17 0840  . loperamide (IMODIUM) capsule 4 mg  4 mg Oral QID Osvaldo Shipper, MD   4 mg at 09/16/17 0840  . MEDLINE mouth rinse  15 mL Mouth Rinse BID Alyson Reedy, MD   15 mL at 09/16/17 1020  . ondansetron (ZOFRAN) injection 4 mg  4 mg Intravenous Q6H PRN Alyson Reedy, MD   4 mg at 09/12/17 1807  . psyllium (HYDROCIL/METAMUCIL) packet 1 packet  1 packet Oral TID Kinsinger, De Blanch, MD   1 packet at 09/16/17 1006     Discharge Medications: Please see discharge summary for a list of discharge medications.  Relevant Imaging Results:  Relevant Lab Results:   Additional Information SSN: [id number]  Abigail Butts, LCSW

## 2017-09-17 ENCOUNTER — Inpatient Hospital Stay (HOSPITAL_COMMUNITY): Payer: Non-veteran care

## 2017-09-17 DIAGNOSIS — R2231 Localized swelling, mass and lump, right upper limb: Secondary | ICD-10-CM

## 2017-09-17 DIAGNOSIS — M7989 Other specified soft tissue disorders: Secondary | ICD-10-CM

## 2017-09-17 DIAGNOSIS — K9403 Colostomy malfunction: Secondary | ICD-10-CM

## 2017-09-17 LAB — HEPATIC FUNCTION PANEL
ALT: 236 U/L — AB (ref 17–63)
AST: 35 U/L (ref 15–41)
Albumin: 1.8 g/dL — ABNORMAL LOW (ref 3.5–5.0)
Alkaline Phosphatase: 128 U/L — ABNORMAL HIGH (ref 38–126)
BILIRUBIN DIRECT: 0.2 mg/dL (ref 0.1–0.5)
Indirect Bilirubin: 0.8 mg/dL (ref 0.3–0.9)
Total Bilirubin: 1 mg/dL (ref 0.3–1.2)
Total Protein: 6 g/dL — ABNORMAL LOW (ref 6.5–8.1)

## 2017-09-17 LAB — CBC
HEMATOCRIT: 24.1 % — AB (ref 39.0–52.0)
HEMOGLOBIN: 7.7 g/dL — AB (ref 13.0–17.0)
MCH: 28.7 pg (ref 26.0–34.0)
MCHC: 32 g/dL (ref 30.0–36.0)
MCV: 89.9 fL (ref 78.0–100.0)
Platelets: 230 10*3/uL (ref 150–400)
RBC: 2.68 MIL/uL — ABNORMAL LOW (ref 4.22–5.81)
RDW: 15.2 % (ref 11.5–15.5)
WBC: 14.1 10*3/uL — ABNORMAL HIGH (ref 4.0–10.5)

## 2017-09-17 LAB — RENAL FUNCTION PANEL
ALBUMIN: 1.8 g/dL — AB (ref 3.5–5.0)
ANION GAP: 12 (ref 5–15)
BUN: 60 mg/dL — ABNORMAL HIGH (ref 6–20)
CO2: 23 mmol/L (ref 22–32)
Calcium: 7.8 mg/dL — ABNORMAL LOW (ref 8.9–10.3)
Chloride: 102 mmol/L (ref 101–111)
Creatinine, Ser: 4.67 mg/dL — ABNORMAL HIGH (ref 0.61–1.24)
GFR calc Af Amer: 14 mL/min — ABNORMAL LOW (ref >60)
GFR calc non Af Amer: 12 mL/min — ABNORMAL LOW (ref >60)
GLUCOSE: 92 mg/dL (ref 65–99)
PHOSPHORUS: 4.4 mg/dL (ref 2.5–4.6)
POTASSIUM: 3.7 mmol/L (ref 3.5–5.1)
Sodium: 137 mmol/L (ref 135–145)

## 2017-09-17 LAB — GLUCOSE, CAPILLARY
GLUCOSE-CAPILLARY: 110 mg/dL — AB (ref 65–99)
Glucose-Capillary: 83 mg/dL (ref 65–99)
Glucose-Capillary: 94 mg/dL (ref 65–99)
Glucose-Capillary: 102 mg/dL — ABNORMAL HIGH (ref 65–99)
Glucose-Capillary: 127 mg/dL — ABNORMAL HIGH (ref 65–99)

## 2017-09-17 LAB — HEPARIN LEVEL (UNFRACTIONATED)
HEPARIN UNFRACTIONATED: 0.22 [IU]/mL — AB (ref 0.30–0.70)
Heparin Unfractionated: 0.36 IU/mL (ref 0.30–0.70)

## 2017-09-17 MED ORDER — DIPHENOXYLATE-ATROPINE 2.5-0.025 MG/5ML PO LIQD: 10.0000 mL | Freq: Four times a day (QID) | ORAL | Status: DC

## 2017-09-17 MED ORDER — DIPHENOXYLATE-ATROPINE 2.5-0.025 MG PO TABS
2.0000 | ORAL_TABLET | Freq: Four times a day (QID) | ORAL | Status: DC
  Administered 2017-09-17 – 2017-09-21 (×17): 2 via ORAL
  Filled 2017-09-17 (×17): qty 2

## 2017-09-17 MED ORDER — COLLAGENASE 250 UNIT/GM EX OINT
TOPICAL_OINTMENT | Freq: Every day | CUTANEOUS | Status: DC
  Administered 2017-09-17 – 2017-09-20 (×4): via TOPICAL
  Filled 2017-09-17: qty 30

## 2017-09-17 NOTE — Progress Notes (Signed)
ANTICOAGULATION CONSULT NOTE  Pharmacy Consult for Heparin Indication: pulmonary embolus and DVT  No Known Allergies  Patient Measurements: Height: 5\' 10"  (177.8 cm) Weight: 155 lb 10.3 oz (70.6 kg) IBW/kg (Calculated) : 73 Heparin Dosing Weight: 69.8 kg  Vital Signs: Temp: 98.4 F (36.9 C) (01/29 0517) Temp Source: Oral (01/29 0517) BP: 141/85 (01/29 0517) Pulse Rate: 89 (01/29 0517)  Labs: Recent Labs    09/15/17 0744 09/16/17 0651 09/17/17 0438  HGB 8.6* 8.3* 7.7*  HCT 27.5* 26.0* 24.1*  PLT 185 220 230  HEPARINUNFRC 0.37 0.44 0.22*  CREATININE 5.54* 5.26*  --     Estimated Creatinine Clearance: 14.4 mL/min (A) (by C-G formula based on SCr of 5.26 mg/dL (H)).  Assessment:   62 yr old male continues on IV heparin for PE and bilateral DVT.   S/p PEA arrest x 2, received TNKase on 09/10/17.    Heparin level is low, no issues with infusion per RN   Hgb slightly down, plts ok  Goal of Therapy:  Heparin level 0.3-0.7 units/ml Monitor platelets by anticoagulation protocol: Yes   Plan:   Increase heparin to 2150 units/hr   Daily heparin level and CBC.  Check confirmatory level this afternoon   Caleb FridayMasters, Caleb Singh  09/17/2017 6:03 AM

## 2017-09-17 NOTE — Progress Notes (Signed)
Assessment/Plan:62 year old black male with previous acute kidney injury requiring dialysis in December 2018.He now presents with worsening renal function in the setting of PEA arrest with prolonged hypotension requiring pressors 1. AKI, nonoliguric-acute on chronic kidney injury. Creatinine was noted to be 1.1 in September 2017. After Francee Nodal December it appears his creatinine nadir was around 2. 2. Hypertension/volume-Cont IVF.  Needs aggressive treatment of GI output 3.Hypokalemic, will supp 4. Anemia -has been low. Transfuse as needed. iron stores good - gave darbe Sat 5. RUE swelling, check XRay and doppler  Subjective: Interval History: c/o RUE pain and swelling  Objective: Vital signs in last 24 hours: Temp:  [97.8 F (36.6 C)-99.1 F (37.3 C)] 98.2 F (36.8 C) (01/29 1111) Pulse Rate:  [89-101] 89 (01/29 1111) Resp:  [18-28] 23 (01/29 1111) BP: (127-143)/(77-91) 134/91 (01/29 1111) SpO2:  [100 %] 100 % (01/29 1111) Weight:  [70.6 kg (155 lb 10.3 oz)] 70.6 kg (155 lb 10.3 oz) (01/29 0517) Weight change: 0.8 kg (1 lb 12.2 oz)  Intake/Output from previous day: 01/28 0701 - 01/29 0700 In: 5523.5 [P.O.:1320; I.V.:3677.3; NG/GT:526.2] Out: 5125 [Urine:1375; Stool:3750] Intake/Output this shift: Total I/O In: 1640.6 [P.O.:240; I.V.:1400.6] Out: 550 [Urine:200; Stool:350]  General appearance: alert and cooperative Chest wall: no tenderness Cardio: regular rate and rhythm, S1, S2 normal, no murmur, click, rub or gallop Extremities: swollen painful medial RUE  Lab Results: Recent Labs    09/16/17 0651 09/17/17 0438  WBC 12.3* 14.1*  HGB 8.3* 7.7*  HCT 26.0* 24.1*  PLT 220 230   BMET:  Recent Labs    09/16/17 0651 09/17/17 0438  NA 142 137  K 3.3* 3.7  CL 104 102  CO2 24 23  GLUCOSE 116* 92  BUN 65* 60*  CREATININE 5.26* 4.67*  CALCIUM 7.9* 7.8*   No results for input(s): PTH in the last 72 hours. Iron Studies: No results for input(s): IRON,  TIBC, TRANSFERRIN, FERRITIN in the last 72 hours. Studies/Results: Dg Swallowing Func-speech Pathology  Result Date: 09/16/2017 Objective Swallowing Evaluation: Type of Study: MBS-Modified Barium Swallow Study  Patient Details Name: Caleb Singh MRN: [id number] Date of Birth: 06/12/1956 Today's Date: 09/16/2017 Time: SLP Start Time (ACUTE ONLY): 1409 -SLP Stop Time (ACUTE ONLY): 1439 SLP Time Calculation (min) (ACUTE ONLY): 30 min Past Medical History: Past Medical History: Diagnosis Date . Bowel obstruction (HCC)  . Pneumothorax  Past Surgical History: Past Surgical History: Procedure Laterality Date . COLOSTOMY   . I&D EXTREMITY Right 04/17/2016  Procedure: IRRIGATION AND DEBRIDEMENT RIGHT HAND;  Surgeon: Dominica Severin, MD;  Location: [facility] OR;  Service: Orthopedics;  Laterality: Right; . I&D EXTREMITY Right 04/19/2016  Procedure: REPEAT I&D RIGHT HAND;  Surgeon: Dominica Severin, MD;  Location: [facility] OR;  Service: Orthopedics;  Laterality: Right; . LAPAROTOMY N/A 07/23/2017  Procedure: EXPLORATORY LAPAROTOMY, SMALL BOWEL RESECTION;  Surgeon: Gaynelle Adu, MD;  Location: [facility] ORS;  Service: General;  Laterality: N/A; . LAPAROTOMY N/A 07/25/2017  Procedure: EXPLORATORY LAPAROSCOPY WITH ILEOCECTOMY, END ILEOSTOMY;  Surgeon: Almond Lint, MD;  Location: [facility] ORS;  Service: General;  Laterality: N/A;  PATIENT ABDOMINAL WOUND LEFT OPEN AND PACKED WITH BULKY DRESSING . LAPAROTOMY N/A 07/31/2017  Procedure: EXPLORATORY LAPAROTOMY drainage of abdominal abcess;  Surgeon: Glenna Fellows, MD;  Location: [facility] ORS;  Service: General;  Laterality: N/A; . LEFT HEART CATH AND CORONARY ANGIOGRAPHY N/A 08/13/2017  Procedure: LEFT HEART CATH AND CORONARY ANGIOGRAPHY;  Surgeon: Dolores Patty, MD;  Location: [facility] INVASIVE CV LAB;  Service: Cardiovascular;  Laterality: N/A; HPI:  Pt is a 62 y.o.malewith PMH of PE, tobacco abuse, and recent hospitalization for small bowel perforation from 07/23/2017-08/31/2017 complicated by multisystem  failure. Discharged to a SNF 08/31/17, however returned 09/10/17 with DOE and chest tightness, with subsequent respiratory failure from presumed massive PE, with PEA arrest x2 (1/22). Pt intubated 1/22 to 1/24. Failed post-extubation screen. CXR (1/24) revealing interval increase in bibasilar airspace disease and associated effusions. While this may represent atelectasis, infection is not excluded. Findings are worse on the right.  Subjective: Pt positioned in chair and cooperative throughout assessment Assessment / Plan / Recommendation CHL IP CLINICAL IMPRESSIONS 09/16/2017 Clinical Impression Pt presents with mild-moderate pharyngeal dysphagia characterized by penetration/aspiration of nectar thick and thin liquids and moderate residue due to generalized weakness of swallowing musculature, reduced airway closure, and inadequate clearance through the UES. Pt's oral phase was mildly impaired by anterior mastication, premature swallow, and piecemeal swallow. Mod verbal cues to clear throat and repeat swallow were used to clear airway of penetration and some aspiration with all liquids. Chin tuck was attempted with thin liquids, but proven not effective given aspiration event. Pt reduced pharyngeal residue using spontaneous and cued multiple swallows. Residue at the UES and pyriform sinus likely impacted by reduced pharyngeal peristalsis and reduced UES opening given presence of large NG tube. Therefore, removal of NG tube, when medically appropriate, will likely benefit swallow function. Given pt's oral phase deficits, pharyngeal residue, and reduced airway protection, recommend thin liquid and Dysphagia 2 (chopped) diet. Aspiration precautions include small/slow bites/sips, intermittent throat clear with sips of liquid, multiple swallows, and siting up-right for oral intake. Pt swallow function likely to improve with removal of NG tube and reconditioning of musculature as pt resumes diet. SLP will follow up to monitor  pt's safety with diet and potential for advancement.   SLP Visit Diagnosis Dysphagia, oropharyngeal phase (R13.12) Attention and concentration deficit following -- Frontal lobe and executive function deficit following -- Impact on safety and function Moderate aspiration risk   CHL IP TREATMENT RECOMMENDATION 09/16/2017 Treatment Recommendations Therapy as outlined in treatment plan below   Prognosis 09/16/2017 Prognosis for Safe Diet Advancement Good Barriers to Reach Goals Cognitive deficits Barriers/Prognosis Comment -- CHL IP DIET RECOMMENDATION 09/16/2017 SLP Diet Recommendations Dysphagia 2 (Fine chop) solids;Thin liquid Liquid Administration via Straw Medication Administration Crushed with puree Compensations Slow rate;Small sips/bites;Multiple dry swallows after each bite/sip;Clear throat intermittently Postural Changes Seated upright at 90 degrees   CHL IP OTHER RECOMMENDATIONS 09/16/2017 Recommended Consults -- Oral Care Recommendations Oral care BID Other Recommendations --   CHL IP FOLLOW UP RECOMMENDATIONS 09/16/2017 Follow up Recommendations Skilled Nursing facility   [facility] IP FREQUENCY AND DURATION 09/16/2017 Speech Therapy Frequency (ACUTE ONLY) min 2x/week Treatment Duration 2 weeks      CHL IP ORAL PHASE 09/16/2017 Oral Phase Impaired Oral - Pudding Teaspoon -- Oral - Pudding Cup -- Oral - Honey Teaspoon -- Oral - Honey Cup -- Oral - Nectar Teaspoon -- Oral - Nectar Cup -- Oral - Nectar Straw Piecemeal swallowing;Premature spillage Oral - Thin Teaspoon -- Oral - Thin Cup -- Oral - Thin Straw Piecemeal swallowing;Premature spillage Oral - Puree Piecemeal swallowing Oral - Mech Soft -- Oral - Regular Piecemeal swallowing;Impaired mastication Oral - Multi-Consistency -- Oral - Pill -- Oral Phase - Comment --  CHL IP PHARYNGEAL PHASE 09/16/2017 Pharyngeal Phase Impaired Pharyngeal- Pudding Teaspoon -- Pharyngeal -- Pharyngeal- Pudding Cup -- Pharyngeal -- Pharyngeal- Honey Teaspoon -- Pharyngeal -- Pharyngeal-  Honey Cup -- Pharyngeal -- Pharyngeal-  Nectar Teaspoon -- Pharyngeal -- Pharyngeal- Nectar Cup -- Pharyngeal -- Pharyngeal- Nectar Straw Pharyngeal residue - cp segment;Pharyngeal residue - pyriform;Pharyngeal residue - valleculae;Reduced pharyngeal peristalsis;Penetration/Aspiration during swallow;Reduced airway/laryngeal closure;Reduced epiglottic inversion Pharyngeal Material enters airway, passes BELOW cords without attempt by patient to eject out (silent aspiration) Pharyngeal- Thin Teaspoon -- Pharyngeal -- Pharyngeal- Thin Cup -- Pharyngeal -- Pharyngeal- Thin Straw Compensatory strategies attempted (with notebox);Pharyngeal residue - cp segment;Pharyngeal residue - pyriform;Pharyngeal residue - valleculae;Reduced pharyngeal peristalsis;Penetration/Aspiration during swallow;Reduced epiglottic inversion;Reduced airway/laryngeal closure Pharyngeal Material enters airway, passes BELOW cords without attempt by patient to eject out (silent aspiration) Pharyngeal- Puree Pharyngeal residue - cp segment;Pharyngeal residue - pyriform;Pharyngeal residue - valleculae;Reduced pharyngeal peristalsis;Reduced epiglottic inversion;Reduced airway/laryngeal closure Pharyngeal -- Pharyngeal- Mechanical Soft -- Pharyngeal -- Pharyngeal- Regular Pharyngeal residue - cp segment;Pharyngeal residue - pyriform;Pharyngeal residue - valleculae;Reduced pharyngeal peristalsis;Reduced epiglottic inversion;Reduced airway/laryngeal closure Pharyngeal -- Pharyngeal- Multi-consistency -- Pharyngeal -- Pharyngeal- Pill -- Pharyngeal -- Pharyngeal Comment --  CHL IP CERVICAL ESOPHAGEAL PHASE 09/16/2017 Cervical Esophageal Phase Impaired Pudding Teaspoon -- Pudding Cup -- Honey Teaspoon -- Honey Cup -- Nectar Teaspoon -- Nectar Cup -- Nectar Straw Reduced cricopharyngeal relaxation Thin Teaspoon -- Thin Cup -- Thin Straw Reduced cricopharyngeal relaxation Puree Reduced cricopharyngeal relaxation Mechanical Soft -- Regular Reduced cricopharyngeal  relaxation Multi-consistency -- Pill -- Cervical Esophageal Comment -- No flowsheet data found. Maxcine Hamaiewonsky, Laura 09/16/2017, 4:15 PM  Note populated for [place] Jarrett, Student SLP Maxcine HamLaura Paiewonsky, M.A. CCC-SLP [phone]              Scheduled: . collagenase   Topical Daily  . darbepoetin (ARANESP) injection - NON-DIALYSIS  100 mcg Subcutaneous Q Fri-1800  . diphenoxylate-atropine  2 tablet Oral QID  . feeding supplement (PRO-STAT SUGAR FREE 64)  30 mL Oral BID  . insulin aspart  2-6 Units Subcutaneous Q4H  . loperamide  4 mg Oral QID  . mouth rinse  15 mL Mouth Rinse BID  . psyllium  1 packet Oral TID   Continuous: . sodium chloride 150 mL/hr at 09/17/17 0650  . sodium chloride    . heparin 2,150 Units/hr (09/17/17 0650)     LOS: 7 days   Lauris PoagAlvin C Boleslaus Holloway 09/17/2017,3:53 PM

## 2017-09-17 NOTE — Progress Notes (Signed)
PT Cancellation Note  Patient Details Name: Caleb Singh MRN: [id number] DOB: October 23, 1955   Cancelled Treatment:    Reason Eval/Treat Not Completed: Medical issues which prohibited therapy.  Pt's R UE swollen and pain, awaits  ?dopplers.  Will see 1/30 as able. 09/17/2017  Caleb Singh, PT [phone]  (pager)   Caleb Singh 09/17/2017, 4:08 PM

## 2017-09-17 NOTE — Consult Note (Signed)
WOC Nurse wound follow up Wound type: surgical  Measurement:11cm x 3.5cm x 1.0cm  Wound bed: 75% pink/ 25% yellow/green slough loose, non viable tissue with sutures visible  Drainage (amount, consistency, odor) minimal, no odor  Periwound: intact  Dressing procedure/placement/frequency: Continue saline moist gauze dressings, add enzymatic debridement ointment to to the nonviable tissue, cover with moist gauze. Top with Dry dressing change BID.  WOC Nurse ostomy follow up Stoma type/location: RLQ, end ileostomy Stomal assessment/size: 1 3/8" x 1 3/4", budded, pink, moist Peristomal assessment: NA Pouch changed successfully per bedside nursing staff Sunday, seal intact  Treatment options for stomal/peristomal skin: using 2" barrier ring Output thin, yellow, high volumes Ostomy pouching: 2pc. High output pouch in place  Education provided: sister and other visitors at bedside today.  Did not provide any education today.  Enrolled patient in [facility] [facility] [employer] Discharge program: Yes, previously   WOC nurse will continue to follow along with you for support with wound and ostomy care. Carine Nordgren [facility] MSN,RN,CWOCN, CNS, [employer] [phone]

## 2017-09-17 NOTE — Progress Notes (Signed)
*  Preliminary Results* Right upper extremity venous duplex completed. Right upper extremity is negative for acute deep and superficial vein thrombosis. 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  09/17/2017 4:49 PM  Gertie FeyMichelle Sydell Prowell, BS, RVT, RDCS, RDMS

## 2017-09-17 NOTE — Progress Notes (Addendum)
2:12 pm CSW received call from [place]. Patient has been approved through [place] for 32 days of rehab at one of their community contracted SNFs. CSW faxed referrals to [facility], [facility], [facility], as well as [facility]. [facility] has declined. CSW to follow up with other facilities regarding referral and bed offers.  10:32 am CSW faxed referral to [facility] for patient to be screened for SNF approval yesterday evening.  CSW met with patient at bedside this morning. Patient alert and oriented and with pleasant demeanor. Patient talked at length about some regrets in choices he's made and also talked about positive and loving relationships with family members. Patient hopeful for placement in SNF, stating that he cannot care for himself at home at this time, given his conditions.  CSW to follow for disposition planning, pending approval from [place] for SNF.  Estanislado Emms, [place]

## 2017-09-17 NOTE — Progress Notes (Signed)
[facility] PROGRESS NOTE  Caleb Singh ZOX:[id number] DOB: [birth year] DOA: 09/10/2017  PCP: Patient, No Pcp Per  Brief History/Interval Summary: 62 year old Caucasian male with past medical history of smoking history of PE who suffered from a perforated small bowel in December for which he underwent small bowel resection complicated by multisystem failure requiring CRRT.  Course was also complicated by E. coli bacteremia, respiratory failure, brief arrest and intra-abdominal abscess which was drained percutaneously.  He was discharged on 1/12 to rehab.  On 1/22 started having worsening shortness of breath and was admitted for the same.  He was undergoing workup to rule out PE and while he was in VQ scan he had worsening dyspnea with a rapid decline and then had PEA arrest.  He received systemic TPA due to strong suspicion for pulmonary embolism.  Transthoracic echocardiogram was suspicious for right atrial and IVC blood clot.  He was admitted to the intensive care unit.  Stabilized and then transferred to the floor.  Continues to have renal failure.  He has high ostomy output for which he is getting antimotility agents.  Consultants: Pulmonology.  Cardiology.  Nephrology.   STUDIES:  Echo 1/22>>>EF 65-70%, grade 1 diastolic dysfunction, large RA clot measuring 4.63cm x 1cm which is protruding through theTV and is consistent with very large thrombus. The atrium wasmoderately dilated. Severe RV dysfunction, suspicion of clot in IVC Echo 1/23>>> thrombus in RA no longer seen, EF 60-65%, severely dilated RV CT abd pelvis 1/24>>>  CULTURES: BC x 2 1/22>>>  Urine 1/22>>>   ANTIBIOTICS: Meropenem 1/22>>>   SIGNIFICANT EVENTS: 1/22>>> PEA arrest, TPA  LINES/TUBES: ETT 1/22>>>  L fem CVL 1/22>>>  L fem Aline 1/22>>>    Subjective/Interval History: Patient in very good spirits this morning.  Continues to have pain in his right arm but he is able to move it and use it for  feeding purposes etc.  Denies any chest pain shortness of breath.  No abdominal pain.      ROS: Denies any headaches.  Objective:  Vital Signs  Vitals:   09/16/17 1940 09/17/17 0000 09/17/17 0517 09/17/17 0700  BP: 140/77 134/80 (!) 141/85 127/86  Pulse: (!) 101 95 89 91  Resp: (!) 25 (!) 28 20 18   Temp: 98.8 F (37.1 C) 99.1 F (37.3 C) 98.4 F (36.9 C) 98.4 F (36.9 C)  TempSrc: Oral Oral Oral Axillary  SpO2: 100% 100% 100% 100%  Weight:   70.6 kg (155 lb 10.3 oz)   Height:        Intake/Output Summary (Last 24 hours) at 09/17/2017 0833 Last data filed at 09/17/2017 0650 Gross per 24 hour  Intake 5523.51 ml  Output 4300 ml  Net 1223.51 ml   Filed Weights   09/15/17 0410 09/16/17 0439 09/17/17 0517  Weight: 72.3 kg (159 lb 6.3 oz) 69.8 kg (153 lb 14.1 oz) 70.6 kg (155 lb 10.3 oz)    General appearance: Awake alert.  In no distress Resp: Good air entry bilaterally.  No wheezing rales or rhonchi.  Normal effort. Cardio: S1-S2 is normal regular.  No S3-S4.  No rubs murmurs or bruit GI: Continues to have large output from his ostomy although appears to have slowed down some.  Abdomen is nontender.  Dressing noted over the incision site.  No blood noted in the ostomy back.    Extremities: Edema is present bilateral lower extremities as well as in the upper extremities.  Edema bruising noted in the right upper extremity.  Good radial pulses. Neurologic: No focal deficits.  Lab Results:  Data Reviewed: I have personally reviewed following labs and imaging studies  CBC: Recent Labs  Lab 09/11/17 0042  09/13/17 0400 09/14/17 0459 09/15/17 0744 09/16/17 0651 09/17/17 0438  WBC 11.3*   < > 15.8* 15.8* 14.5* 12.3* 14.1*  NEUTROABS 8.2*  --   --   --   --   --   --   HGB 5.1*   < > 8.4* 8.6* 8.6* 8.3* 7.7*  HCT 16.0*   < > 25.3* 25.2* 27.5* 26.0* 24.1*  MCV 90.4   < > 87.2 88.7 90.8 91.9 89.9  PLT 197   < > 168 175 185 220 230   < > = values in this interval not  displayed.    Basic Metabolic Panel: Recent Labs  Lab 09/12/17 0404  09/13/17 0400 09/13/17 1050 09/13/17 1905 09/14/17 0459 09/15/17 0744 09/16/17 0651 09/17/17 0438  NA 137   < > 138  --   --  139 143 142 137  K 3.7   < > 3.7  --   --  3.2* 3.2* 3.3* 3.7  CL 91*   < > 92*  --   --  97* 103 104 102  CO2 29   < > 30  --   --  27 24 24 23   GLUCOSE 120*   < > 93  --   --  119* 126* 116* 92  BUN 54*   < > 54*  --   --  55* 63* 65* 60*  CREATININE 3.71*   < > 4.21*  --   --  4.99* 5.54* 5.26* 4.67*  CALCIUM 6.4*   < > 6.6*  --   --  7.0* 7.7* 7.9* 7.8*  MG 1.7  --  1.7 1.8 2.0 1.9  --   --   --   PHOS 5.4*  --   --  6.6* 6.6* 5.9*  6.0* 5.7* 5.0* 4.4   < > = values in this interval not displayed.    GFR: Estimated Creatinine Clearance: 16.2 mL/min (A) (by C-G formula based on SCr of 4.67 mg/dL (H)).  Liver Function Tests: Recent Labs  Lab 09/11/17 0042 09/11/17 0541 09/12/17 0404 09/13/17 0400 09/14/17 0459 09/15/17 0744 09/16/17 0651 09/17/17 0438  AST 195* 1,631* 4,320* 1,411*  --   --   --  35  ALT 158* 1,425* 2,507* 1,701*  --   --   --  236*  ALKPHOS 109 165* 170* 185*  --   --   --  128*  BILITOT 0.8 1.5* 1.2 1.4*  --   --   --  1.0  PROT 4.0* 5.3* 4.7* 5.2*  --   --   --  6.0*  ALBUMIN 1.1* 1.5* 1.5* 1.7* 1.6* 1.7* 1.8* 1.8*  1.8*    Coagulation Profile: Recent Labs  Lab 09/11/17 0042  INR 4.02*    Cardiac Enzymes: Recent Labs  Lab 09/11/17 1439 09/11/17 2155 09/12/17 1158 09/12/17 1438 09/12/17 2201  TROPONINI 5.18* 4.42* 1.82* 1.62* 1.08*    CBG: Recent Labs  Lab 09/16/17 1626 09/16/17 1940 09/16/17 2356 09/17/17 0428 09/17/17 0803  GLUCAP 87 104* 97 83 94    Anemia Panel: No results for input(s): VITAMINB12, FOLATE, FERRITIN, TIBC, IRON, RETICCTPCT in the last 72 hours.  Recent Results (from the past 240 hour(s))  Blood culture (routine x 2)     Status: None   Collection Time: 09/10/17 12:14 PM  Result Value Ref Range Status     Specimen Description BLOOD RIGHT FOREARM  Final   Special Requests   Final    BOTTLES DRAWN AEROBIC AND ANAEROBIC Blood Culture adequate volume   Culture NO GROWTH 5 DAYS  Final   Report Status 09/15/2017 FINAL  Final  Blood culture (routine x 2)     Status: None   Collection Time: 09/10/17 12:30 PM  Result Value Ref Range Status   Specimen Description BLOOD RIGHT ANTECUBITAL  Final   Special Requests   Final    BOTTLES DRAWN AEROBIC AND ANAEROBIC Blood Culture adequate volume   Culture NO GROWTH 5 DAYS  Final   Report Status 09/15/2017 FINAL  Final  Urine culture     Status: None   Collection Time: 09/12/17 12:48 AM  Result Value Ref Range Status   Specimen Description URINE, RANDOM  Final   Special Requests NONE  Final   Culture NO GROWTH  Final   Report Status 09/13/2017 FINAL  Final  MRSA PCR Screening     Status: None   Collection Time: 09/12/17  3:29 PM  Result Value Ref Range Status   MRSA by PCR NEGATIVE NEGATIVE Final    Comment:        The GeneXpert MRSA Assay (FDA approved for NASAL specimens only), is one component of a comprehensive MRSA colonization surveillance program. It is not intended to diagnose MRSA infection nor to guide or monitor treatment for MRSA infections.       Radiology Studies: Ct Abdomen Pelvis Wo Contrast  Result Date: 09/15/2017 CLINICAL DATA:  62 year old male with a history of possible GI bleeding and ascites EXAM: CT ABDOMEN AND PELVIS WITHOUT CONTRAST TECHNIQUE: Multidetector CT imaging of the abdomen and pelvis was performed following the standard protocol without IV contrast. COMPARISON:  CT 08/22/2017, 08/17/2017 FINDINGS: Lower chest: Mixed ground-glass and nodular opacity of the right lower lobe. Small bilateral pleural effusions with associated atelectasis. Hepatobiliary: Unremarkable appearance of liver parenchyma. Hyperdense material within the gallbladder lumen, new from the comparison. Pericholecystic fluid. No intrahepatic  or extrahepatic biliary ductal dilatation. Pancreas: Unremarkable appearance of pancreas Spleen: Unremarkable spleen Adrenals/Urinary Tract: Unremarkable adrenal glands. Right kidney with no hydronephrosis or nephrolithiasis. Redemonstration of cyst of the posterior right kidney cortex. No left-sided hydronephrosis or nephrolithiasis. Hyperdense focus in the posterior left kidney cortex, unchanged. Urinary catheter within the urinary bladder which is decompressed. Gas within the urinary bladder, likely secondary to manipulation. Stomach/Bowel: Gastric tube terminates within the stomach lumen. No evidence of abnormally distended small bowel or colon. Ostomy within the right abdomen again demonstrated. Surgical changes at the right lower quadrant of prior bowel resection. Evaluation of small bowel and colon wall limited by the exclusion of contrast. There are no focal loops of small bowel or colon with hyperdense material that would indicate accumulating blood products. Vascular/Lymphatic: Scattered vascular calcifications. Small lymph nodes in the base of the mesentery and periaortic nodal stations, unchanged. There is diffuse missed the mesentery and hazy infiltration of the fat within the abdomen. Re-demonstration of infiltration of the lower chest and abdominal wall. Reproductive: Unremarkable pelvic structures. Other: Midline surgical changes. Since the prior CT, there has been interval removal of the pigtail drainage catheter of the right lower quadrant, as well as the surgical drains of the upper abdomen. Musculoskeletal: No acute displaced fracture. Multilevel degenerative changes of the thoracic and lumbar spine. Schmorl's node of the lower L1 endplate. IMPRESSION: There is no CT finding that would confirm or  exclude GI hemorrhage. If there is ongoing clinical concern, a nuclear medicine tagged red bowel study may be considered. Hyperdense material within the gallbladder lumen, may reflect vicarious excretion  of contrast or microlithiasis. If there is concern for acute biliary obstruction, correlation with physical exam and HIDA study may be considered. Diffuse abdominal and body wall edema/anasarca. Airspace disease of the right lower lobe, concerning for pneumonia. Interval removal of pigtail drainage catheter and surgical drains. Redemonstration of surgical changes along the midline abdomen and ostomy, status post bowel resection. Small bilateral pleural effusions. Electronically Signed   By: Gilmer Mor D.O.   On: 09/15/2017 10:13   Dg Swallowing Func-speech Pathology  Result Date: 09/16/2017 Objective Swallowing Evaluation: Type of Study: MBS-Modified Barium Swallow Study  Patient Details Name: Caleb Singh MRN: [id number] Date of Birth: Jun 29, 1956 Today's Date: 09/16/2017 Time: SLP Start Time (ACUTE ONLY): 1409 -SLP Stop Time (ACUTE ONLY): 1439 SLP Time Calculation (min) (ACUTE ONLY): 30 min Past Medical History: Past Medical History: Diagnosis Date . Bowel obstruction (HCC)  . Pneumothorax  Past Surgical History: Past Surgical History: Procedure Laterality Date . COLOSTOMY   . I&D EXTREMITY Right 04/17/2016  Procedure: IRRIGATION AND DEBRIDEMENT RIGHT HAND;  Surgeon: Dominica Severin, MD;  Location: [facility] OR;  Service: Orthopedics;  Laterality: Right; . I&D EXTREMITY Right 04/19/2016  Procedure: REPEAT I&D RIGHT HAND;  Surgeon: Dominica Severin, MD;  Location: [facility] OR;  Service: Orthopedics;  Laterality: Right; . LAPAROTOMY N/A 07/23/2017  Procedure: EXPLORATORY LAPAROTOMY, SMALL BOWEL RESECTION;  Surgeon: Gaynelle Adu, MD;  Location: [facility] ORS;  Service: General;  Laterality: N/A; . LAPAROTOMY N/A 07/25/2017  Procedure: EXPLORATORY LAPAROSCOPY WITH ILEOCECTOMY, END ILEOSTOMY;  Surgeon: Almond Lint, MD;  Location: [facility] ORS;  Service: General;  Laterality: N/A;  PATIENT ABDOMINAL WOUND LEFT OPEN AND PACKED WITH BULKY DRESSING . LAPAROTOMY N/A 07/31/2017  Procedure: EXPLORATORY LAPAROTOMY drainage of abdominal abcess;   Surgeon: Glenna Fellows, MD;  Location: [facility] ORS;  Service: General;  Laterality: N/A; . LEFT HEART CATH AND CORONARY ANGIOGRAPHY N/A 08/13/2017  Procedure: LEFT HEART CATH AND CORONARY ANGIOGRAPHY;  Surgeon: Dolores Patty, MD;  Location: [facility] INVASIVE CV LAB;  Service: Cardiovascular;  Laterality: N/A; HPI: Pt is a 62 y.o.malewith PMH of PE, tobacco abuse, and recent hospitalization for small bowel perforation from 07/23/2017-08/31/2017 complicated by multisystem failure. Discharged to a SNF 08/31/17, however returned 09/10/17 with DOE and chest tightness, with subsequent respiratory failure from presumed massive PE, with PEA arrest x2 (1/22). Pt intubated 1/22 to 1/24. Failed post-extubation screen. CXR (1/24) revealing interval increase in bibasilar airspace disease and associated effusions. While this may represent atelectasis, infection is not excluded. Findings are worse on the right.  Subjective: Pt positioned in chair and cooperative throughout assessment Assessment / Plan / Recommendation CHL IP CLINICAL IMPRESSIONS 09/16/2017 Clinical Impression Pt presents with mild-moderate pharyngeal dysphagia characterized by penetration/aspiration of nectar thick and thin liquids and moderate residue due to generalized weakness of swallowing musculature, reduced airway closure, and inadequate clearance through the UES. Pt's oral phase was mildly impaired by anterior mastication, premature swallow, and piecemeal swallow. Mod verbal cues to clear throat and repeat swallow were used to clear airway of penetration and some aspiration with all liquids. Chin tuck was attempted with thin liquids, but proven not effective given aspiration event. Pt reduced pharyngeal residue using spontaneous and cued multiple swallows. Residue at the UES and pyriform sinus likely impacted by reduced pharyngeal peristalsis and reduced UES opening given presence of large NG tube. Therefore, removal  of NG tube, when medically appropriate,  will likely benefit swallow function. Given pt's oral phase deficits, pharyngeal residue, and reduced airway protection, recommend thin liquid and Dysphagia 2 (chopped) diet. Aspiration precautions include small/slow bites/sips, intermittent throat clear with sips of liquid, multiple swallows, and siting up-right for oral intake. Pt swallow function likely to improve with removal of NG tube and reconditioning of musculature as pt resumes diet. SLP will follow up to monitor pt's safety with diet and potential for advancement.   SLP Visit Diagnosis Dysphagia, oropharyngeal phase (R13.12) Attention and concentration deficit following -- Frontal lobe and executive function deficit following -- Impact on safety and function Moderate aspiration risk   CHL IP TREATMENT RECOMMENDATION 09/16/2017 Treatment Recommendations Therapy as outlined in treatment plan below   Prognosis 09/16/2017 Prognosis for Safe Diet Advancement Good Barriers to Reach Goals Cognitive deficits Barriers/Prognosis Comment -- CHL IP DIET RECOMMENDATION 09/16/2017 SLP Diet Recommendations Dysphagia 2 (Fine chop) solids;Thin liquid Liquid Administration via Straw Medication Administration Crushed with puree Compensations Slow rate;Small sips/bites;Multiple dry swallows after each bite/sip;Clear throat intermittently Postural Changes Seated upright at 90 degrees   CHL IP OTHER RECOMMENDATIONS 09/16/2017 Recommended Consults -- Oral Care Recommendations Oral care BID Other Recommendations --   CHL IP FOLLOW UP RECOMMENDATIONS 09/16/2017 Follow up Recommendations Skilled Nursing facility   [facility] IP FREQUENCY AND DURATION 09/16/2017 Speech Therapy Frequency (ACUTE ONLY) min 2x/week Treatment Duration 2 weeks      CHL IP ORAL PHASE 09/16/2017 Oral Phase Impaired Oral - Pudding Teaspoon -- Oral - Pudding Cup -- Oral - Honey Teaspoon -- Oral - Honey Cup -- Oral - Nectar Teaspoon -- Oral - Nectar Cup -- Oral - Nectar Straw Piecemeal swallowing;Premature spillage Oral -  Thin Teaspoon -- Oral - Thin Cup -- Oral - Thin Straw Piecemeal swallowing;Premature spillage Oral - Puree Piecemeal swallowing Oral - Mech Soft -- Oral - Regular Piecemeal swallowing;Impaired mastication Oral - Multi-Consistency -- Oral - Pill -- Oral Phase - Comment --  CHL IP PHARYNGEAL PHASE 09/16/2017 Pharyngeal Phase Impaired Pharyngeal- Pudding Teaspoon -- Pharyngeal -- Pharyngeal- Pudding Cup -- Pharyngeal -- Pharyngeal- Honey Teaspoon -- Pharyngeal -- Pharyngeal- Honey Cup -- Pharyngeal -- Pharyngeal- Nectar Teaspoon -- Pharyngeal -- Pharyngeal- Nectar Cup -- Pharyngeal -- Pharyngeal- Nectar Straw Pharyngeal residue - cp segment;Pharyngeal residue - pyriform;Pharyngeal residue - valleculae;Reduced pharyngeal peristalsis;Penetration/Aspiration during swallow;Reduced airway/laryngeal closure;Reduced epiglottic inversion Pharyngeal Material enters airway, passes BELOW cords without attempt by patient to eject out (silent aspiration) Pharyngeal- Thin Teaspoon -- Pharyngeal -- Pharyngeal- Thin Cup -- Pharyngeal -- Pharyngeal- Thin Straw Compensatory strategies attempted (with notebox);Pharyngeal residue - cp segment;Pharyngeal residue - pyriform;Pharyngeal residue - valleculae;Reduced pharyngeal peristalsis;Penetration/Aspiration during swallow;Reduced epiglottic inversion;Reduced airway/laryngeal closure Pharyngeal Material enters airway, passes BELOW cords without attempt by patient to eject out (silent aspiration) Pharyngeal- Puree Pharyngeal residue - cp segment;Pharyngeal residue - pyriform;Pharyngeal residue - valleculae;Reduced pharyngeal peristalsis;Reduced epiglottic inversion;Reduced airway/laryngeal closure Pharyngeal -- Pharyngeal- Mechanical Soft -- Pharyngeal -- Pharyngeal- Regular Pharyngeal residue - cp segment;Pharyngeal residue - pyriform;Pharyngeal residue - valleculae;Reduced pharyngeal peristalsis;Reduced epiglottic inversion;Reduced airway/laryngeal closure Pharyngeal -- Pharyngeal-  Multi-consistency -- Pharyngeal -- Pharyngeal- Pill -- Pharyngeal -- Pharyngeal Comment --  CHL IP CERVICAL ESOPHAGEAL PHASE 09/16/2017 Cervical Esophageal Phase Impaired Pudding Teaspoon -- Pudding Cup -- Honey Teaspoon -- Honey Cup -- Nectar Teaspoon -- Nectar Cup -- Nectar Straw Reduced cricopharyngeal relaxation Thin Teaspoon -- Thin Cup -- Thin Straw Reduced cricopharyngeal relaxation Puree Reduced cricopharyngeal relaxation Mechanical Soft -- Regular Reduced cricopharyngeal relaxation Multi-consistency -- Pill -- Cervical  Esophageal Comment -- No flowsheet data found. Maxcine Hamaiewonsky, Laura 09/16/2017, 4:15 PM  Note populated for [place] Jarrett, Student SLP Maxcine HamLaura Paiewonsky, M.A. CCC-SLP [phone]               Medications:  Scheduled: . darbepoetin (ARANESP) injection - NON-DIALYSIS  100 mcg Subcutaneous Q Fri-1800  . diphenoxylate-atropine  2 tablet Oral QID  . feeding supplement (PRO-STAT SUGAR FREE 64)  30 mL Oral BID  . insulin aspart  2-6 Units Subcutaneous Q4H  . loperamide  4 mg Oral QID  . mouth rinse  15 mL Mouth Rinse BID  . psyllium  1 packet Oral TID   Continuous: . sodium chloride 150 mL/hr at 09/17/17 0650  . sodium chloride    . sodium chloride    . sodium chloride    . heparin 2,150 Units/hr (09/17/17 0650)   XBJ:YNWGNFPRN:sodium chloride, albuterol, benzonatate, fentaNYL (SUBLIMAZE) injection, ondansetron (ZOFRAN) IV, oxyCODONE  Assessment/Plan:   Acute hypoxic respiratory failure  Most likely secondary to massive PE.  Patient was intubated in the intensive care unit.  Extubated on 1/24.  Seems to be stable from a respiratory standpoint.  Chest x-ray did show bibasilar opacities.  Thought to be atelectasis though infection cannot be ruled out.  CT scan done of the abdomen and pelvis showed airspace disease of the right lung.  Small pleural effusions were noted.  It is quite possible he may have aspirated as well.  These changes could also be due to pulmonary embolism.  He seems  to be stable from a respiratory standpoint without any new symptoms.  Continue to monitor for now.    AKI on CKD Baseline Scr ~1.9-2.1.  Again noted to be improving.  Nephrology is following and patient has been started on IV fluids.  Renal function worsened most likely due to high ostomy output.  Patient has required CRRT during previous hospitalization.    High Ostomy Output/Recent perforated small bowel 12/24 with intra-abd abscess Patient noted to have significant output from his ostomy.  Loose stool.  Nonbloody.  CT of abdomen pelvis does not show anything concerning.  No intra-abdominal fluid collection noted.  Reason for high output from his ostomy not clear.  Could be due to short gut syndrome.  Discussed with general surgery.  They recommended fiber and Imodium.  This was initiated.  Stool output still remains quite high.  We will add Lomotil.  We may have to consider octreotide.  Continue to monitor for now.    Suspect massive pulmonary embolism/RA/IVC thrombus//acute lower extremity DVT F/u echo did no longer visualized RA thrombus.  DVT noted bilateral lower extremities. S/p systemic thrombolytics 1/22. Holding home norvasc. Heparin gtt per pharmacy. Monitor closely for bleeding.  He will need definitive anticoagulation once his other medical issues have stabilized.  If his renal function does not improve then warfarin may be the only option.  Shock - cardiogenic/PEA arrest Resolved. Has been off of pressors.   Right arm swelling He has had swelling of his right upper extremity for the past many days.  He also has bruising in the right arm.  He has good radial pulses.  It is quite possible that he has DVT there.  He could have also bled from when he got thrombolytics.  He is on IV heparin.  However no active bleeding is noted currently externally.  We will do a Doppler study as the patient does want to know what is causing his swelling.  This would not change management  however.  Elevated troponin/history of bradyarrhythmia Patient seen by cardiology for elevated troponin.  Thought to be secondary to all of the other underlying issues.  No evidence for ischemia.  Previous admission he did require temporary pacing wire.  No bradyarrhythmias noted during this hospitalization yet.  Transaminitis Most likely due to shock liver.  LFTs are almost down to normal.    Normocytic anemia with likely component of acute blood loss  Hemoglobin noted to be slightly low today compared to yesterday.  No overt bleeding.  This is most likely due to hemodilution from IV fluids being given.  Recheck labs tomorrow.  Recent hospitalization for intra-abdominal abscess and E. coli bacteremia  Blood cultures have been negative however patient's pro calcitonin was noted to be significantly elevated at 50.13.  Repeat levels show improvement to 6.87 today.  Was on meropenem empirically.  Antibiotics discontinued 1/27.  CT scan did not show any infectious etiology.  Blood cultures have been negative.  He remains afebrile.  WBC noted to be slightly higher today.  Hyperglycemia   SSI. CBG's stable.  Acute metabolic encephalopathy Mental status seems to be close to baseline now.  No focal neurological deficits. PT and OT following.  Need to go to skilled nursing facility when ready for discharge.  Dysphagia/Nutrition Diet has been advanced.  Tube feedings were discontinued on 1/28.  Malnutrition of moderate degree Encourage oral intake.   DVT Prophylaxis: On IV heparin    Code Status: Partial code Family Communication: No family at bedside Disposition Plan: Regimen as outlined above.  Will need skilled nursing facility for short-term rehab when ready for discharge.  Continues to have acute medical issues.      LOS: 7 days   Osvaldo Shipper  [facility] Pager [phone] 09/17/2017, 8:33 AM  If 7PM-7AM, please contact night-coverage at [email or web address], password  [facility]

## 2017-09-17 NOTE — Progress Notes (Signed)
   09/17/17 1200  Clinical Encounter Type  Visited With Patient  Visit Type Initial  Referral From Patient  Consult/Referral To Chaplain;Nurse  Spiritual Encounters  Spiritual Needs Prayer  Stress Factors  Patient Stress Factors Exhausted  Family Stress Factors None identified   This was a follow-up for this patient. He had been on unit 68M early in the week. I was given report by chaplain Alvester MorinNewton who had visited with pt. that he had requested for the bible. I handed pt the bible and we had good time to talk together about how Jesus is a great sustenance in his general life. I provided emotional support, reflective listening and compassionate presence.  Bettymae Yott a Water quality scientistMusiko-Holley, E. [employer]. du PontChaplain

## 2017-09-17 NOTE — Progress Notes (Signed)
ANTICOAGULATION CONSULT NOTE - Follow Up Consult  Pharmacy Consult for Heparin Indication: pulmonary embolus and DVT  No Known Allergies  Patient Measurements: Height: 5\' 10"  (177.8 cm) Weight: 155 lb 10.3 oz (70.6 kg) IBW/kg (Calculated) : 73 Heparin Dosing Weight: 70 kg  Vital Signs: Temp: 98.2 F (36.8 C) (01/29 1111) Temp Source: Oral (01/29 1111) BP: 134/91 (01/29 1111) Pulse Rate: 89 (01/29 1111)  Labs: Recent Labs    09/15/17 0744 09/16/17 0651 09/17/17 0438 09/17/17 1432  HGB 8.6* 8.3* 7.7*  --   HCT 27.5* 26.0* 24.1*  --   PLT 185 220 230  --   HEPARINUNFRC 0.37 0.44 0.22* 0.36  CREATININE 5.54* 5.26* 4.67*  --     Estimated Creatinine Clearance: 16.2 mL/min (A) (by C-G formula based on SCr of 4.67 mg/dL (H)).  Assessment:   62 yr old male continues on IV heparin for PE and bilateral DVT.   S/p PEA arrest x 2, received TNKase on 09/10/17.    Heparin level was low this am (0.22) on 2000 units/hr and drip rate increased.   Heparin level is now therapeutic (0.36) on 2150 units/hr. Hgb trended down some. RUE swollen and bruised. Duplex pending. No bleeding reported.   Goal of Therapy:  Heparin level 0.3-0.7 units/ml Monitor platelets by anticoagulation protocol: Yes   Plan:   Continue heparin drip at 21500 units/hr.  Daily heparin level and CBC.  [name], Aubert Choyce Donovan, ColoradoRPh Pager: [phone] or [phone] 09/17/2017,4:42 PM

## 2017-09-18 ENCOUNTER — Other Ambulatory Visit: Payer: Self-pay

## 2017-09-18 LAB — RENAL FUNCTION PANEL
Albumin: 1.8 g/dL — ABNORMAL LOW (ref 3.5–5.0)
Anion gap: 12 (ref 5–15)
BUN: 54 mg/dL — AB (ref 6–20)
CHLORIDE: 101 mmol/L (ref 101–111)
CO2: 19 mmol/L — AB (ref 22–32)
CREATININE: 3.91 mg/dL — AB (ref 0.61–1.24)
Calcium: 7.7 mg/dL — ABNORMAL LOW (ref 8.9–10.3)
GFR calc non Af Amer: 15 mL/min — ABNORMAL LOW (ref >60)
GFR, EST AFRICAN AMERICAN: 17 mL/min — AB (ref >60)
Glucose, Bld: 91 mg/dL (ref 65–99)
Phosphorus: 4.8 mg/dL — ABNORMAL HIGH (ref 2.5–4.6)
Potassium: 4 mmol/L (ref 3.5–5.1)
Sodium: 132 mmol/L — ABNORMAL LOW (ref 135–145)

## 2017-09-18 LAB — CBC
HCT: 23.7 % — ABNORMAL LOW (ref 39.0–52.0)
Hemoglobin: 7.7 g/dL — ABNORMAL LOW (ref 13.0–17.0)
MCH: 28.9 pg (ref 26.0–34.0)
MCHC: 32.5 g/dL (ref 30.0–36.0)
MCV: 89.1 fL (ref 78.0–100.0)
PLATELETS: 277 10*3/uL (ref 150–400)
RBC: 2.66 MIL/uL — ABNORMAL LOW (ref 4.22–5.81)
RDW: 15.4 % (ref 11.5–15.5)
WBC: 13.7 10*3/uL — ABNORMAL HIGH (ref 4.0–10.5)

## 2017-09-18 LAB — GLUCOSE, CAPILLARY
GLUCOSE-CAPILLARY: 104 mg/dL — AB (ref 65–99)
GLUCOSE-CAPILLARY: 97 mg/dL (ref 65–99)
Glucose-Capillary: 91 mg/dL (ref 65–99)
Glucose-Capillary: 107 mg/dL — ABNORMAL HIGH (ref 65–99)
Glucose-Capillary: 107 mg/dL — ABNORMAL HIGH (ref 65–99)
Glucose-Capillary: 127 mg/dL — ABNORMAL HIGH (ref 65–99)

## 2017-09-18 LAB — HEPARIN LEVEL (UNFRACTIONATED): Heparin Unfractionated: 0.35 IU/mL (ref 0.30–0.70)

## 2017-09-18 MED ORDER — SODIUM BICARBONATE 8.4 % IV SOLN
150.0000 meq | INTRAVENOUS | Status: DC
  Administered 2017-09-18: 150 meq via INTRAVENOUS
  Filled 2017-09-18 (×3): qty 850

## 2017-09-18 MED ORDER — FENTANYL CITRATE (PF) 100 MCG/2ML IJ SOLN
50.0000 ug | INTRAMUSCULAR | Status: DC | PRN
  Administered 2017-09-18: 100 ug via INTRAVENOUS
  Administered 2017-09-18: 50 ug via INTRAVENOUS
  Administered 2017-09-19 – 2017-09-21 (×8): 100 ug via INTRAVENOUS
  Filled 2017-09-18 (×10): qty 2

## 2017-09-18 MED ORDER — INSULIN ASPART 100 UNIT/ML ~~LOC~~ SOLN
0.0000 [IU] | Freq: Three times a day (TID) | SUBCUTANEOUS | Status: DC
Start: 2017-09-18 — End: 2017-09-21

## 2017-09-18 NOTE — Progress Notes (Signed)
PROGRESS NOTE    Caleb Singh  ZOX:[id number] DOB: 08-06-1956 DOA: 09/10/2017 PCP: Patient, No Pcp Per     Brief Narrative:  Caleb Singh is a 62 year old Caucasian male with past medical history of smoking, history of PE, who suffered from a perforated small bowel in December for which he underwent small bowel resection complicated by multisystem failure requiring CRRT.  Recovery was also complicated by E. coli bacteremia, respiratory failure, brief arrest and intra-abdominal abscess which was drained percutaneously.  He was discharged on 1/12 to rehab.  On 1/22 started having worsening shortness of breath.  He was undergoing workup to rule out PE and while he was in VQ scan he had worsening dyspnea with a rapid decline and then had PEA arrest.  He received systemic tPA due to strong suspicion for pulmonary embolism.  Transthoracic echocardiogram was suspicious for right atrial and IVC blood clot.  He was admitted to the intensive care unit. Now he has stabilized and then transferred to the floor. Nephrology has been following for acute renal failure.   Assessment & Plan:   Active Problems:   Lactic acidosis   Malnutrition of moderate degree  Acute hypoxic respiratory failure  Most likely secondary to massive PE.  Patient was intubated in the intensive care unit.  Extubated on 1/24.  Seems to be stable from a respiratory standpoint.  Chest x-ray did show bibasilar opacities.  Thought to be atelectasis though infection cannot be ruled out.  CT scan done of the abdomen and pelvis showed airspace disease of the right lung.  Small pleural effusions were noted.  It is quite possible he may have aspirated as well.  These changes could also be due to pulmonary embolism.  He seems to be stable from a respiratory standpoint without any new symptoms.  Continue to monitor for now. Stable on room air today and no respiratory distress or conversational dyspnea.   AKI on CKD Baseline Scr ~1.9-2.1.   Slowly improving.  Nephrology is following and patient has been started on IV fluids.  Renal function worsened most likely due to high ostomy output.  Patient has required CRRT during previous hospitalization.    High ostomy output/Recent perforated small bowel 12/24 with intra-abd abscess Patient noted to have significant output from his ostomy. CT of abdomen pelvis does not show anything concerning.  No intra-abdominal fluid collection noted.  Reason for high output from his ostomy not clear.  Could be due to short gut syndrome.  Dr. Rito Ehrlich discussed with general surgery.  They recommended fiber and Imodium. Output has decreased last 24 hours from nearly 4-5L daily to 1L. Monitor.    Suspect massive pulmonary embolism/RA/IVC thrombus//acute lower extremity DVT F/u echo did no longer visualized RA thrombus.  DVT noted bilateral lower extremities. S/p systemic thrombolytics 1/22. Holding home norvasc. Heparin gtt per pharmacy. Monitor closely for bleeding.  He will need definitive anticoagulation once his other medical issues have stabilized.  If his renal function does not improve then warfarin may be the only option.  Shock - cardiogenic/PEA arrest Resolved. Has been off of pressors.   Right arm swelling He has had swelling of his right upper extremity for the past many days.  He also has bruising in the right arm.  He has good radial pulses.  [place] negative for DVT but US revealed complex collection of fluid hematoma vs abscess.  As patient has not had any fevers or worsening leukocytosis, this is most likely a hematoma.  Per nursing, arm appears  stable in size from yesterday's examination.  We will continue to monitor, elevate.  Elevated troponin/history of bradyarrhythmia Patient seen by cardiology for elevated troponin.  Thought to be secondary to all of the other underlying issues.  No evidence for ischemia.  Previous admission he did require temporary pacing wire.  No bradyarrhythmias noted  during this hospitalization yet.  Transaminitis Most likely due to shock liver.    Resolved  Normocytic anemia with likely component of acute blood loss  Stable  Recent hospitalization for intra-abdominal abscess and E. coli bacteremia  Blood cultures have been negative however patient's pro calcitonin was noted to be significantly elevated at 50.13.  Repeat levels show improvement to 6.87 today.  Was on meropenem empirically.  Antibiotics discontinued 1/27.  CT scan did not show any intra-abdominal infectious etiology.  Blood cultures have been negative.  He remains afebrile.  WBC has been elevated but stable  Hyperglycemia  SSI. CBG's stable.   Acute metabolic encephalopathy Back to baseline  Dysphagia/Nutrition Diet has been advanced.  Tube feedings were discontinued on 1/28.  Malnutrition of moderate degree Encourage oral intake.   DVT prophylaxis: Heparin drip Code Status: Partial code Family Communication: No family at bedside Disposition Plan: Skilled nursing facility when stable   Consultants:   PCCM  Cardiology  Nephrology   Antimicrobials:  Anti-infectives (From admission, onward)   Start     Dose/Rate Route Frequency Ordered Stop   09/10/17 1500  meropenem (MERREM) 500 mg in sodium chloride 0.9 % 50 mL IVPB     500 mg 100 mL/hr over 30 Minutes Intravenous Every 12 hours 09/10/17 1434 09/15/17 0425        Subjective: Patient in good spirits today.  He is sitting in bed, no new complaints.  His main issue has been swelling and pain of his right upper extremity.  He denies any chest pain or worsening shortness of breath.  Objective: Vitals:   09/17/17 1935 09/18/17 0012 09/18/17 0440 09/18/17 1100  BP: (!) 145/88 (!) 137/92 (!) 139/95 (!) 144/96  Pulse: 97 88 87 88  Resp: 20 20 16    Temp: 98.5 F (36.9 C) 98.3 F (36.8 C) 98.3 F (36.8 C) 98.5 F (36.9 C)  TempSrc: Oral Oral Oral Oral  SpO2: 100% 100% 100% 97%  Weight:   71.2 kg (156 lb  14.4 oz)   Height:        Intake/Output Summary (Last 24 hours) at 09/18/2017 1444 Last data filed at 09/18/2017 1300 Gross per 24 hour  Intake 4555.6 ml  Output 2740 ml  Net 1815.6 ml   Filed Weights   09/16/17 0439 09/17/17 0517 09/18/17 0440  Weight: 69.8 kg (153 lb 14.1 oz) 70.6 kg (155 lb 10.3 oz) 71.2 kg (156 lb 14.4 oz)    Examination:  General exam: Appears calm and comfortable  Respiratory system: Clear to auscultation. Respiratory effort normal. Cardiovascular system: S1 & S2 heard, RRR. No JVD, murmurs, rubs, gallops or clicks. No pedal edema. Gastrointestinal system: Abdomen is nondistended, soft and nontender. No organomegaly or masses felt. Normal bowel sounds heard.  Ostomy in place Central nervous system: Alert and oriented. No focal neurological deficits. Extremities: + Right upper extremity with swelling and bruising Skin: No rashes, lesions or ulcers Psychiatry: Judgement and insight appear normal. Mood & affect appropriate.   Data Reviewed: I have personally reviewed following labs and imaging studies  CBC: Recent Labs  Lab 09/14/17 0459 09/15/17 0744 09/16/17 0651 09/17/17 0438 09/18/17 0412  WBC 15.8*  14.5* 12.3* 14.1* 13.7*  HGB 8.6* 8.6* 8.3* 7.7* 7.7*  HCT 25.2* 27.5* 26.0* 24.1* 23.7*  MCV 88.7 90.8 91.9 89.9 89.1  PLT 175 185 220 230 277   Basic Metabolic Panel: Recent Labs  Lab 09/12/17 0404  09/13/17 0400 09/13/17 1050 09/13/17 1905 09/14/17 0459 09/15/17 0744 09/16/17 0651 09/17/17 0438 09/18/17 0412  NA 137   < > 138  --   --  139 143 142 137 132*  K 3.7   < > 3.7  --   --  3.2* 3.2* 3.3* 3.7 4.0  CL 91*   < > 92*  --   --  97* 103 104 102 101  CO2 29   < > 30  --   --  27 24 24 23  19*  GLUCOSE 120*   < > 93  --   --  119* 126* 116* 92 91  BUN 54*   < > 54*  --   --  55* 63* 65* 60* 54*  CREATININE 3.71*   < > 4.21*  --   --  4.99* 5.54* 5.26* 4.67* 3.91*  CALCIUM 6.4*   < > 6.6*  --   --  7.0* 7.7* 7.9* 7.8* 7.7*  MG 1.7  --   1.7 1.8 2.0 1.9  --   --   --   --   PHOS 5.4*  --   --  6.6* 6.6* 5.9*  6.0* 5.7* 5.0* 4.4 4.8*   < > = values in this interval not displayed.   GFR: Estimated Creatinine Clearance: 19.5 mL/min (A) (by C-G formula based on SCr of 3.91 mg/dL (H)). Liver Function Tests: Recent Labs  Lab 09/12/17 0404 09/13/17 0400 09/14/17 0459 09/15/17 0744 09/16/17 0651 09/17/17 0438 09/18/17 0412  AST 4,320* 1,411*  --   --   --  35  --   ALT 2,507* 1,701*  --   --   --  236*  --   ALKPHOS 170* 185*  --   --   --  128*  --   BILITOT 1.2 1.4*  --   --   --  1.0  --   PROT 4.7* 5.2*  --   --   --  6.0*  --   ALBUMIN 1.5* 1.7* 1.6* 1.7* 1.8* 1.8*  1.8* 1.8*   No results for input(s): LIPASE, AMYLASE in the last 168 hours. No results for input(s): AMMONIA in the last 168 hours. Coagulation Profile: No results for input(s): INR, PROTIME in the last 168 hours. Cardiac Enzymes: Recent Labs  Lab 09/11/17 2155 09/12/17 1158 09/12/17 1438 09/12/17 2201  TROPONINI 4.42* 1.82* 1.62* 1.08*   BNP (last 3 results) No results for input(s): PROBNP in the last 8760 hours. HbA1C: No results for input(s): HGBA1C in the last 72 hours. CBG: Recent Labs  Lab 09/17/17 2058 09/18/17 0017 09/18/17 0445 09/18/17 0818 09/18/17 1144  GLUCAP 127* 104* 91 97 107*   Lipid Profile: No results for input(s): CHOL, HDL, LDLCALC, TRIG, CHOLHDL, LDLDIRECT in the last 72 hours. Thyroid Function Tests: No results for input(s): TSH, T4TOTAL, FREET4, T3FREE, THYROIDAB in the last 72 hours. Anemia Panel: No results for input(s): VITAMINB12, FOLATE, FERRITIN, TIBC, IRON, RETICCTPCT in the last 72 hours. Sepsis Labs: Recent Labs  Lab 09/12/17 0404 09/12/17 1158 09/15/17 0744 09/16/17 0651  PROCALCITON 50.13  --  15.48 6.87  LATICACIDVEN  --  1.9  --   --     Recent Results (from the past  240 hour(s))  Blood culture (routine x 2)     Status: None   Collection Time: 09/10/17 12:14 PM  Result Value Ref  Range Status   Specimen Description BLOOD RIGHT FOREARM  Final   Special Requests   Final    BOTTLES DRAWN AEROBIC AND ANAEROBIC Blood Culture adequate volume   Culture NO GROWTH 5 DAYS  Final   Report Status 09/15/2017 FINAL  Final  Blood culture (routine x 2)     Status: None   Collection Time: 09/10/17 12:30 PM  Result Value Ref Range Status   Specimen Description BLOOD RIGHT ANTECUBITAL  Final   Special Requests   Final    BOTTLES DRAWN AEROBIC AND ANAEROBIC Blood Culture adequate volume   Culture NO GROWTH 5 DAYS  Final   Report Status 09/15/2017 FINAL  Final  Urine culture     Status: None   Collection Time: 09/12/17 12:48 AM  Result Value Ref Range Status   Specimen Description URINE, RANDOM  Final   Special Requests NONE  Final   Culture NO GROWTH  Final   Report Status 09/13/2017 FINAL  Final  MRSA PCR Screening     Status: None   Collection Time: 09/12/17  3:29 PM  Result Value Ref Range Status   MRSA by PCR NEGATIVE NEGATIVE Final    Comment:        The GeneXpert MRSA Assay (FDA approved for NASAL specimens only), is one component of a comprehensive MRSA colonization surveillance program. It is not intended to diagnose MRSA infection nor to guide or monitor treatment for MRSA infections.        Radiology Studies: Dg Elbow Complete Right (3+view)  Result Date: 09/18/2017 CLINICAL DATA:  Right elbow pain and swelling for 3 days. No injury. EXAM: RIGHT ELBOW - COMPLETE 3+ VIEW COMPARISON:  None. FINDINGS: There is no evidence of fracture, dislocation, or joint effusion. There is no evidence of arthropathy or other focal bone abnormality. Soft tissues are unremarkable. IMPRESSION: Negative. Electronically Signed   By: Burman Nieves M.D.   On: 09/18/2017 00:22   Dg Swallowing Func-speech Pathology  Result Date: 09/16/2017 Objective Swallowing Evaluation: Type of Study: MBS-Modified Barium Swallow Study  Patient Details Name: Caleb Singh MRN: [id number] Date  of Birth: [birth year] Today's Date: 09/16/2017 Time: SLP Start Time (ACUTE ONLY): 1409 -SLP Stop Time (ACUTE ONLY): 1439 SLP Time Calculation (min) (ACUTE ONLY): 30 min Past Medical History: Past Medical History: Diagnosis Date . Bowel obstruction (HCC)  . Pneumothorax  Past Surgical History: Past Surgical History: Procedure Laterality Date . COLOSTOMY   . I&D EXTREMITY Right 04/17/2016  Procedure: IRRIGATION AND DEBRIDEMENT RIGHT HAND;  Surgeon: Dominica Severin, MD;  Location: [facility] OR;  Service: Orthopedics;  Laterality: Right; . I&D EXTREMITY Right 04/19/2016  Procedure: REPEAT I&D RIGHT HAND;  Surgeon: Dominica Severin, MD;  Location: [facility] OR;  Service: Orthopedics;  Laterality: Right; . LAPAROTOMY N/A 07/23/2017  Procedure: EXPLORATORY LAPAROTOMY, SMALL BOWEL RESECTION;  Surgeon: Gaynelle Adu, MD;  Location: [facility] ORS;  Service: General;  Laterality: N/A; . LAPAROTOMY N/A 07/25/2017  Procedure: EXPLORATORY LAPAROSCOPY WITH ILEOCECTOMY, END ILEOSTOMY;  Surgeon: Almond Lint, MD;  Location: [facility] ORS;  Service: General;  Laterality: N/A;  PATIENT ABDOMINAL WOUND LEFT OPEN AND PACKED WITH BULKY DRESSING . LAPAROTOMY N/A 07/31/2017  Procedure: EXPLORATORY LAPAROTOMY drainage of abdominal abcess;  Surgeon: Glenna Fellows, MD;  Location: [facility] ORS;  Service: General;  Laterality: N/A; . LEFT HEART CATH AND CORONARY ANGIOGRAPHY N/A 08/13/2017  Procedure: LEFT  HEART CATH AND CORONARY ANGIOGRAPHY;  Surgeon: Dolores Patty, MD;  Location: [facility] INVASIVE CV LAB;  Service: Cardiovascular;  Laterality: N/A; HPI: Pt is a 62 y.o.malewith PMH of PE, tobacco abuse, and recent hospitalization for small bowel perforation from 07/23/2017-08/31/2017 complicated by multisystem failure. Discharged to a SNF 08/31/17, however returned 09/10/17 with DOE and chest tightness, with subsequent respiratory failure from presumed massive PE, with PEA arrest x2 (1/22). Pt intubated 1/22 to 1/24. Failed post-extubation screen. CXR (1/24) revealing interval  increase in bibasilar airspace disease and associated effusions. While this may represent atelectasis, infection is not excluded. Findings are worse on the right.  Subjective: Pt positioned in chair and cooperative throughout assessment Assessment / Plan / Recommendation CHL IP CLINICAL IMPRESSIONS 09/16/2017 Clinical Impression Pt presents with mild-moderate pharyngeal dysphagia characterized by penetration/aspiration of nectar thick and thin liquids and moderate residue due to generalized weakness of swallowing musculature, reduced airway closure, and inadequate clearance through the UES. Pt's oral phase was mildly impaired by anterior mastication, premature swallow, and piecemeal swallow. Mod verbal cues to clear throat and repeat swallow were used to clear airway of penetration and some aspiration with all liquids. Chin tuck was attempted with thin liquids, but proven not effective given aspiration event. Pt reduced pharyngeal residue using spontaneous and cued multiple swallows. Residue at the UES and pyriform sinus likely impacted by reduced pharyngeal peristalsis and reduced UES opening given presence of large NG tube. Therefore, removal of NG tube, when medically appropriate, will likely benefit swallow function. Given pt's oral phase deficits, pharyngeal residue, and reduced airway protection, recommend thin liquid and Dysphagia 2 (chopped) diet. Aspiration precautions include small/slow bites/sips, intermittent throat clear with sips of liquid, multiple swallows, and siting up-right for oral intake. Pt swallow function likely to improve with removal of NG tube and reconditioning of musculature as pt resumes diet. SLP will follow up to monitor pt's safety with diet and potential for advancement.   SLP Visit Diagnosis Dysphagia, oropharyngeal phase (R13.12) Attention and concentration deficit following -- Frontal lobe and executive function deficit following -- Impact on safety and function Moderate  aspiration risk   CHL IP TREATMENT RECOMMENDATION 09/16/2017 Treatment Recommendations Therapy as outlined in treatment plan below   Prognosis 09/16/2017 Prognosis for Safe Diet Advancement Good Barriers to Reach Goals Cognitive deficits Barriers/Prognosis Comment -- CHL IP DIET RECOMMENDATION 09/16/2017 SLP Diet Recommendations Dysphagia 2 (Fine chop) solids;Thin liquid Liquid Administration via Straw Medication Administration Crushed with puree Compensations Slow rate;Small sips/bites;Multiple dry swallows after each bite/sip;Clear throat intermittently Postural Changes Seated upright at 90 degrees   CHL IP OTHER RECOMMENDATIONS 09/16/2017 Recommended Consults -- Oral Care Recommendations Oral care BID Other Recommendations --   CHL IP FOLLOW UP RECOMMENDATIONS 09/16/2017 Follow up Recommendations Skilled Nursing facility   [facility] IP FREQUENCY AND DURATION 09/16/2017 Speech Therapy Frequency (ACUTE ONLY) min 2x/week Treatment Duration 2 weeks      CHL IP ORAL PHASE 09/16/2017 Oral Phase Impaired Oral - Pudding Teaspoon -- Oral - Pudding Cup -- Oral - Honey Teaspoon -- Oral - Honey Cup -- Oral - Nectar Teaspoon -- Oral - Nectar Cup -- Oral - Nectar Straw Piecemeal swallowing;Premature spillage Oral - Thin Teaspoon -- Oral - Thin Cup -- Oral - Thin Straw Piecemeal swallowing;Premature spillage Oral - Puree Piecemeal swallowing Oral - Mech Soft -- Oral - Regular Piecemeal swallowing;Impaired mastication Oral - Multi-Consistency -- Oral - Pill -- Oral Phase - Comment --  CHL IP PHARYNGEAL PHASE 09/16/2017 Pharyngeal Phase Impaired Pharyngeal- Pudding  Teaspoon -- Pharyngeal -- Pharyngeal- Pudding Cup -- Pharyngeal -- Pharyngeal- Honey Teaspoon -- Pharyngeal -- Pharyngeal- Honey Cup -- Pharyngeal -- Pharyngeal- Nectar Teaspoon -- Pharyngeal -- Pharyngeal- Nectar Cup -- Pharyngeal -- Pharyngeal- Nectar Straw Pharyngeal residue - cp segment;Pharyngeal residue - pyriform;Pharyngeal residue - valleculae;Reduced pharyngeal  peristalsis;Penetration/Aspiration during swallow;Reduced airway/laryngeal closure;Reduced epiglottic inversion Pharyngeal Material enters airway, passes BELOW cords without attempt by patient to eject out (silent aspiration) Pharyngeal- Thin Teaspoon -- Pharyngeal -- Pharyngeal- Thin Cup -- Pharyngeal -- Pharyngeal- Thin Straw Compensatory strategies attempted (with notebox);Pharyngeal residue - cp segment;Pharyngeal residue - pyriform;Pharyngeal residue - valleculae;Reduced pharyngeal peristalsis;Penetration/Aspiration during swallow;Reduced epiglottic inversion;Reduced airway/laryngeal closure Pharyngeal Material enters airway, passes BELOW cords without attempt by patient to eject out (silent aspiration) Pharyngeal- Puree Pharyngeal residue - cp segment;Pharyngeal residue - pyriform;Pharyngeal residue - valleculae;Reduced pharyngeal peristalsis;Reduced epiglottic inversion;Reduced airway/laryngeal closure Pharyngeal -- Pharyngeal- Mechanical Soft -- Pharyngeal -- Pharyngeal- Regular Pharyngeal residue - cp segment;Pharyngeal residue - pyriform;Pharyngeal residue - valleculae;Reduced pharyngeal peristalsis;Reduced epiglottic inversion;Reduced airway/laryngeal closure Pharyngeal -- Pharyngeal- Multi-consistency -- Pharyngeal -- Pharyngeal- Pill -- Pharyngeal -- Pharyngeal Comment --  CHL IP CERVICAL ESOPHAGEAL PHASE 09/16/2017 Cervical Esophageal Phase Impaired Pudding Teaspoon -- Pudding Cup -- Honey Teaspoon -- Honey Cup -- Nectar Teaspoon -- Nectar Cup -- Nectar Straw Reduced cricopharyngeal relaxation Thin Teaspoon -- Thin Cup -- Thin Straw Reduced cricopharyngeal relaxation Puree Reduced cricopharyngeal relaxation Mechanical Soft -- Regular Reduced cricopharyngeal relaxation Multi-consistency -- Pill -- Cervical Esophageal Comment -- No flowsheet data found. Maxcine Ham 09/16/2017, 4:15 PM  Note populated for [place] Jarrett, Student SLP Maxcine Ham, M.A. CCC-SLP [phone]             [place] Rt  Upper Extrem Ltd Soft Tissue Non Vascular  Result Date: 09/18/2017 CLINICAL DATA:  Right arm swelling for 4 days. EXAM: ULTRASOUND right UPPER EXTREMITY LIMITED TECHNIQUE: Ultrasound examination of the upper extremity soft tissues was performed in the area of clinical concern. COMPARISON:  None. FINDINGS: Images obtained in the right antecubital fossa demonstrate a complex septated cystic and hyperechoic collection measuring 6.5 x 5 x 6.9 cm. No flow is demonstrated in the area on color flow Doppler imaging. Appearance is most consistent with either a hematoma or an abscess. IMPRESSION: Complex collection in the right antecubital fossa measuring up to 6.9 cm diameter. Appearance is most consistent with hematoma or abscess. Electronically Signed   By: Burman Nieves M.D.   On: 09/18/2017 00:25      Scheduled Meds: . collagenase   Topical Daily  . darbepoetin (ARANESP) injection - NON-DIALYSIS  100 mcg Subcutaneous Q Fri-1800  . diphenoxylate-atropine  2 tablet Oral QID  . feeding supplement (PRO-STAT SUGAR FREE 64)  30 mL Oral BID  . insulin aspart  2-6 Units Subcutaneous Q4H  . loperamide  4 mg Oral QID  . mouth rinse  15 mL Mouth Rinse BID  . psyllium  1 packet Oral TID   Continuous Infusions: . sodium chloride    . heparin 2,150 Units/hr (09/18/17 0630)  .  sodium bicarbonate (isotonic) infusion in sterile water 150 mEq (09/18/17 1440)     LOS: 8 days    Time spent: 40 minutes   Noralee Stain, DO [facility] [email or web address] Password [facility] 09/18/2017, 2:44 PM

## 2017-09-18 NOTE — Consult Note (Signed)
WOC nurse contacted regaurding current ostomy pouching system. Needs to convert back to drainable pouching system because stool has become thicker.  Provided [employer] [place] #s for needed supplies. If using traditional pouch will need Lawson # [phone] (2 3/4") pouch.   Contacted staff to verify they have what they needed, now stool is thinning again will keep in high output pouch. They have access now to both should they need them.   WOC nurse will follow along with you for support with ostomy and wound care.  Tylan Briguglio [facility] MSN,RN,CWOCN, CNS, [employer] [phone]

## 2017-09-18 NOTE — Progress Notes (Addendum)
Physical Therapy Treatment Patient Details Name: Caleb Singh MRN: [id number] DOB: July 13, 1956 Today's Date: 09/18/2017    History of Present Illness Pt is a 62 y/o male admitted from SNF secondary to dyspnea. Pt is a smoker (40 pack years) with hx PE, who suffered from a perforated small bowel on December 24 for which he underwent small bowel resection and subsequently suffered from multisystem failure and required CRRT. Course was c/b E Coli bacteremia, respiratory failure, brief arrest, and intra-abdominal abscess which was drained percutaneously.  He was discharged 1/12 to rehab where he has been having difficulties with chronic dyspnea.  He returned 1/22 with worsening dyspnea and DOE with chest tightness.   He was admitted for respiratory failure and was undergoing w/u to r/o PE. He was in VQ scan when he had worsening dyspnea despite bipap, had rapid decline and ultimately PEA arrest x 2.  He received systemic TPA and TTE suspicious for RA and IVC clot.  He had ongoing profound shock, coagulopathy and significant anemia.     PT Comments    Pt improving steadily.  Endurance, strength and R UE pain remain hurdles to function and independence as does anxiety over his status.  But pt was willing and participative.  Emphasis on transitions, standing, gait stability and functional standing/endurance activity.    Follow Up Recommendations  SNF     Equipment Recommendations  None recommended by PT    Recommendations for Other Services       Precautions / Restrictions Precautions Precautions: Fall Precaution Comments: NG tube, colostomy Restrictions Weight Bearing Restrictions: No    Mobility  Bed Mobility Overal bed mobility: Needs Assistance Bed Mobility: Supine to Sit;Sit to Supine     Supine to sit: Min assist;+2 for safety/equipment Sit to supine: Mod assist;+2 for safety/equipment;+2 for physical assistance   General bed mobility comments: Assist on return to bed for  BLE.   Transfers Overall transfer level: Needs assistance Equipment used: 2 person hand held assist Transfers: Sit to/from Stand Sit to Stand: +2 safety/equipment;Min assist         General transfer comment: cued and assisted to come foward, but also assisted with boost up.  From chair with arm, pt needed less assist  Ambulation/Gait Ambulation/Gait assistance: Min assist;+2 physical assistance;+2 safety/equipment Ambulation Distance (Feet): 18 Feet(x2) Assistive device: 2 person hand held assist Gait Pattern/deviations: Step-through pattern   Gait velocity interpretation: Below normal speed for age/gender General Gait Details: mildly unsteady overall with cues and assist not only for R UE support, but for some truncal support.   Stairs            Wheelchair Mobility    Modified Rankin (Stroke Patients Only)       Balance Overall balance assessment: Needs assistance Sitting-balance support: No upper extremity supported Sitting balance-Leahy Scale: Fair     Standing balance support: Single extremity supported;Bilateral upper extremity supported Standing balance-Leahy Scale: Poor Standing balance comment: reliant on external support,  pt used the sink base for support during ADL task (brushing teeth).  This short activity significantly fatigued the patient                            Cognition Arousal/Alertness: Awake/alert Behavior During Therapy: WFL for tasks assessed/performed;Anxious Overall Cognitive Status: Within Functional Limits for tasks assessed  General Comments: Anxious concerning R UE swelling      Exercises General Exercises - Upper Extremity Digit Composite Flexion: PROM;Right;5 reps;Supine Composite Extension: PROM;Right;5 reps;Supine    General Comments General comments (skin integrity, edema, etc.): sats on RA during ambulation to sink not read quickly enough, but soon after  measured 96% on the return trip to the bed, sats  in the mid 90's on RA.  EHR during the session ranged from a high in the 130's to low 140's.  Pt was dyspneic and needed sitting rest breaks to continue.      Pertinent Vitals/Pain Pain Assessment: Faces Faces Pain Scale: Hurts little more Pain Location: R arm Pain Descriptors / Indicators: Grimacing;Discomfort Pain Intervention(s): Limited activity within patient's tolerance;Monitored during session    Home Living                      Prior Function            PT Goals (current goals can now be found in the care plan section) Acute Rehab PT Goals Patient Stated Goal: [facility] get stronger PT Goal Formulation: With patient Time For Goal Achievement: 09/27/17 Potential to Achieve Goals: Good Progress towards PT goals: Progressing toward goals    Frequency    Min 2X/week      PT Plan Current plan remains appropriate    Co-evaluation PT/OT/SLP Co-Evaluation/Treatment: Yes Reason for Co-Treatment: For patient/therapist safety PT goals addressed during session: Mobility/safety with mobility OT goals addressed during session: ADL's and self-care;Strengthening/ROM      AM-PAC PT "6 Clicks" Daily Activity  Outcome Measure  Difficulty turning over in bed (including adjusting bedclothes, sheets and blankets)?: A Little Difficulty moving from lying on back to sitting on the side of the bed? : Unable Difficulty sitting down on and standing up from a chair with arms (e.g., wheelchair, bedside commode, etc,.)?: Unable Help needed moving to and from a bed to chair (including a wheelchair)?: A Little Help needed walking in hospital room?: A Little Help needed climbing 3-5 steps with a railing? : A Lot 6 Click Score: 13    End of Session   Activity Tolerance: Patient limited by fatigue Patient left: in bed;with call bell/phone within reach;with bed alarm set Nurse Communication: Mobility status PT  Visit Diagnosis: Other abnormalities of gait and mobility (R26.89);Muscle weakness (generalized) (M62.81)     Time: [id number] PT Time Calculation (min) (ACUTE ONLY): 41 min  Charges:  $Gait Training: 8-22 mins $Therapeutic Activity: 8-22 mins                    G Codes:       09/18/2017  Caleb Singh, PT [phone]  (pager)   Caleb Singh 09/18/2017, 5:38 PM

## 2017-09-18 NOTE — Progress Notes (Signed)
Assessment/Plan:62 year old male with h/o acute kidney injury requiring dialysis in December 2018.Now presents with AKI after PEA arrest and prolonged hypotension   1.AKI, nonoliguric-acute on chronic kidney injury. Creatinine was noted to be 1.1 in September 2017. After Francee Nodal December it appears his creatinine nadir was around 2. 2. Hypertension/volume-Reduce IVF, change to bicarb. Cont aggressive treatment of GI output 3.Hypokalemic, improved 4. Anemia -has been low. Transfuse as needed. iron stores good - on darbe  5. RUE swelling, Neg DVT or fx  Subjective: Interval History: GI output slowing.  Objective: Vital signs in last 24 hours: Temp:  [98.3 F (36.8 C)-98.5 F (36.9 C)] 98.5 F (36.9 C) (01/30 1100) Pulse Rate:  [87-97] 88 (01/30 1100) Resp:  [16-20] 16 (01/30 0440) BP: (137-145)/(88-96) 144/96 (01/30 1100) SpO2:  [97 %-100 %] 97 % (01/30 1100) Weight:  [71.2 kg (156 lb 14.4 oz)] 71.2 kg (156 lb 14.4 oz) (01/30 0440) Weight change: 0.569 kg (1 lb 4.1 oz)  Intake/Output from previous day: 01/29 0701 - 01/30 0700 In: 4173 [P.O.:480; I.V.:3693] Out: 2690 [Urine:1540; Stool:1150] Intake/Output this shift: Total I/O In: 382.6 [P.O.:240; I.V.:142.6] Out: 600 [Urine:600]  General appearance: alert and cooperative Resp: clear to auscultation bilaterally Cardio: regular rate and rhythm, S1, S2 normal, no murmur, click, rub or gallop Extremities: extremities normal, atraumatic, no cyanosis or edema  Swollen RUE  Lab Results: Recent Labs    09/17/17 0438 09/18/17 0412  WBC 14.1* 13.7*  HGB 7.7* 7.7*  HCT 24.1* 23.7*  PLT 230 277   BMET:  Recent Labs    09/17/17 0438 09/18/17 0412  NA 137 132*  K 3.7 4.0  CL 102 101  CO2 23 19*  GLUCOSE 92 91  BUN 60* 54*  CREATININE 4.67* 3.91*  CALCIUM 7.8* 7.7*   No results for input(s): PTH in the last 72 hours. Iron Studies: No results for input(s): IRON, TIBC, TRANSFERRIN, FERRITIN in the last 72  hours. Studies/Results: Dg Elbow Complete Right (3+view)  Result Date: 09/18/2017 CLINICAL DATA:  Right elbow pain and swelling for 3 days. No injury. EXAM: RIGHT ELBOW - COMPLETE 3+ VIEW COMPARISON:  None. FINDINGS: There is no evidence of fracture, dislocation, or joint effusion. There is no evidence of arthropathy or other focal bone abnormality. Soft tissues are unremarkable. IMPRESSION: Negative. Electronically Signed   By: Burman Nieves M.D.   On: 09/18/2017 00:22   Dg Swallowing Func-speech Pathology  Result Date: 09/16/2017 Objective Swallowing Evaluation: Type of Study: MBS-Modified Barium Swallow Study  Patient Details Name: Branson Kranz MRN: [id number] Date of Birth: [birth year] Today's Date: 09/16/2017 Time: SLP Start Time (ACUTE ONLY): 1409 -SLP Stop Time (ACUTE ONLY): 1439 SLP Time Calculation (min) (ACUTE ONLY): 30 min Past Medical History: Past Medical History: Diagnosis Date . Bowel obstruction (HCC)  . Pneumothorax  Past Surgical History: Past Surgical History: Procedure Laterality Date . COLOSTOMY   . I&D EXTREMITY Right 04/17/2016  Procedure: IRRIGATION AND DEBRIDEMENT RIGHT HAND;  Surgeon: Dominica Severin, MD;  Location: [facility] OR;  Service: Orthopedics;  Laterality: Right; . I&D EXTREMITY Right 04/19/2016  Procedure: REPEAT I&D RIGHT HAND;  Surgeon: Dominica Severin, MD;  Location: [facility] OR;  Service: Orthopedics;  Laterality: Right; . LAPAROTOMY N/A 07/23/2017  Procedure: EXPLORATORY LAPAROTOMY, SMALL BOWEL RESECTION;  Surgeon: Gaynelle Adu, MD;  Location: [facility] ORS;  Service: General;  Laterality: N/A; . LAPAROTOMY N/A 07/25/2017  Procedure: EXPLORATORY LAPAROSCOPY WITH ILEOCECTOMY, END ILEOSTOMY;  Surgeon: Almond Lint, MD;  Location: [facility] ORS;  Service: General;  Laterality: N/A;  PATIENT ABDOMINAL WOUND LEFT OPEN AND PACKED WITH BULKY DRESSING . LAPAROTOMY N/A 07/31/2017  Procedure: EXPLORATORY LAPAROTOMY drainage of abdominal abcess;  Surgeon: Glenna FellowsHoxworth, Benjamin, MD;  Location: [facility] ORS;  Service:  General;  Laterality: N/A; . LEFT HEART CATH AND CORONARY ANGIOGRAPHY N/A 08/13/2017  Procedure: LEFT HEART CATH AND CORONARY ANGIOGRAPHY;  Surgeon: Dolores PattyBensimhon, Daniel R, MD;  Location: [facility] INVASIVE CV LAB;  Service: Cardiovascular;  Laterality: N/A; HPI: Pt is a 62 y.o.malewith PMH of PE, tobacco abuse, and recent hospitalization for small bowel perforation from 07/23/2017-08/31/2017 complicated by multisystem failure. Discharged to a SNF 08/31/17, however returned 09/10/17 with DOE and chest tightness, with subsequent respiratory failure from presumed massive PE, with PEA arrest x2 (1/22). Pt intubated 1/22 to 1/24. Failed post-extubation screen. CXR (1/24) revealing interval increase in bibasilar airspace disease and associated effusions. While this may represent atelectasis, infection is not excluded. Findings are worse on the right.  Subjective: Pt positioned in chair and cooperative throughout assessment Assessment / Plan / Recommendation CHL IP CLINICAL IMPRESSIONS 09/16/2017 Clinical Impression Pt presents with mild-moderate pharyngeal dysphagia characterized by penetration/aspiration of nectar thick and thin liquids and moderate residue due to generalized weakness of swallowing musculature, reduced airway closure, and inadequate clearance through the UES. Pt's oral phase was mildly impaired by anterior mastication, premature swallow, and piecemeal swallow. Mod verbal cues to clear throat and repeat swallow were used to clear airway of penetration and some aspiration with all liquids. Chin tuck was attempted with thin liquids, but proven not effective given aspiration event. Pt reduced pharyngeal residue using spontaneous and cued multiple swallows. Residue at the UES and pyriform sinus likely impacted by reduced pharyngeal peristalsis and reduced UES opening given presence of large NG tube. Therefore, removal of NG tube, when medically appropriate, will likely benefit swallow function. Given pt's oral phase  deficits, pharyngeal residue, and reduced airway protection, recommend thin liquid and Dysphagia 2 (chopped) diet. Aspiration precautions include small/slow bites/sips, intermittent throat clear with sips of liquid, multiple swallows, and siting up-right for oral intake. Pt swallow function likely to improve with removal of NG tube and reconditioning of musculature as pt resumes diet. SLP will follow up to monitor pt's safety with diet and potential for advancement.   SLP Visit Diagnosis Dysphagia, oropharyngeal phase (R13.12) Attention and concentration deficit following -- Frontal lobe and executive function deficit following -- Impact on safety and function Moderate aspiration risk   CHL IP TREATMENT RECOMMENDATION 09/16/2017 Treatment Recommendations Therapy as outlined in treatment plan below   Prognosis 09/16/2017 Prognosis for Safe Diet Advancement Good Barriers to Reach Goals Cognitive deficits Barriers/Prognosis Comment -- CHL IP DIET RECOMMENDATION 09/16/2017 SLP Diet Recommendations Dysphagia 2 (Fine chop) solids;Thin liquid Liquid Administration via Straw Medication Administration Crushed with puree Compensations Slow rate;Small sips/bites;Multiple dry swallows after each bite/sip;Clear throat intermittently Postural Changes Seated upright at 90 degrees   CHL IP OTHER RECOMMENDATIONS 09/16/2017 Recommended Consults -- Oral Care Recommendations Oral care BID Other Recommendations --   CHL IP FOLLOW UP RECOMMENDATIONS 09/16/2017 Follow up Recommendations [facility] D[facility] 09/16/2017 Speech Therapy Frequency (ACUTE ONLY) min 2x/week Treatment Duration 2 weeks      CHL IP ORAL PHASE 09/16/2017 Oral Phase Impaired Oral - Pudding Teaspoon -- Oral - Pudding Cup -- Oral - Honey Teaspoon -- Oral - Honey Cup -- Oral - Nectar Teaspoon -- Oral - Nectar Cup -- Oral - Nectar Straw Piecemeal swallowing;Premature spillage Oral - Thin Teaspoon -- Oral - Thin  Cup -- Oral - Thin Straw  Piecemeal swallowing;Premature spillage Oral - Puree Piecemeal swallowing Oral - Mech Soft -- Oral - Regular Piecemeal swallowing;Impaired mastication Oral - Multi-Consistency -- Oral - Pill -- Oral Phase - Comment --  CHL IP PHARYNGEAL PHASE 09/16/2017 Pharyngeal Phase Impaired Pharyngeal- Pudding Teaspoon -- Pharyngeal -- Pharyngeal- Pudding Cup -- Pharyngeal -- Pharyngeal- Honey Teaspoon -- Pharyngeal -- Pharyngeal- Honey Cup -- Pharyngeal -- Pharyngeal- Nectar Teaspoon -- Pharyngeal -- Pharyngeal- Nectar Cup -- Pharyngeal -- Pharyngeal- Nectar Straw Pharyngeal residue - cp segment;Pharyngeal residue - pyriform;Pharyngeal residue - valleculae;Reduced pharyngeal peristalsis;Penetration/Aspiration during swallow;Reduced airway/laryngeal closure;Reduced epiglottic inversion Pharyngeal Material enters airway, passes BELOW cords without attempt by patient to eject out (silent aspiration) Pharyngeal- Thin Teaspoon -- Pharyngeal -- Pharyngeal- Thin Cup -- Pharyngeal -- Pharyngeal- Thin Straw Compensatory strategies attempted (with notebox);Pharyngeal residue - cp segment;Pharyngeal residue - pyriform;Pharyngeal residue - valleculae;Reduced pharyngeal peristalsis;Penetration/Aspiration during swallow;Reduced epiglottic inversion;Reduced airway/laryngeal closure Pharyngeal Material enters airway, passes BELOW cords without attempt by patient to eject out (silent aspiration) Pharyngeal- Puree Pharyngeal residue - cp segment;Pharyngeal residue - pyriform;Pharyngeal residue - valleculae;Reduced pharyngeal peristalsis;Reduced epiglottic inversion;Reduced airway/laryngeal closure Pharyngeal -- Pharyngeal- Mechanical Soft -- Pharyngeal -- Pharyngeal- Regular Pharyngeal residue - cp segment;Pharyngeal residue - pyriform;Pharyngeal residue - valleculae;Reduced pharyngeal peristalsis;Reduced epiglottic inversion;Reduced airway/laryngeal closure Pharyngeal -- Pharyngeal- Multi-consistency -- Pharyngeal -- Pharyngeal- Pill --  Pharyngeal -- Pharyngeal Comment --  CHL IP CERVICAL ESOPHAGEAL PHASE 09/16/2017 Cervical Esophageal Phase Impaired Pudding Teaspoon -- Pudding Cup -- Honey Teaspoon -- Honey Cup -- Nectar Teaspoon -- Nectar Cup -- Nectar Straw Reduced cricopharyngeal relaxation Thin Teaspoon -- Thin Cup -- Thin Straw Reduced cricopharyngeal relaxation Puree Reduced cricopharyngeal relaxation Mechanical Soft -- Regular Reduced cricopharyngeal relaxation Multi-consistency -- Pill -- Cervical Esophageal Comment -- No flowsheet data found. Maxcine Ham 09/16/2017, 4:15 PM  Note populated for [place] Jarrett, Student SLP Maxcine Ham, M.A. CCC-SLP [phone]             [place] Rt Upper Extrem Ltd Soft Tissue Non Vascular  Result Date: 09/18/2017 CLINICAL DATA:  Right arm swelling for 4 days. EXAM: ULTRASOUND right UPPER EXTREMITY LIMITED TECHNIQUE: Ultrasound examination of the upper extremity soft tissues was performed in the area of clinical concern. COMPARISON:  None. FINDINGS: Images obtained in the right antecubital fossa demonstrate a complex septated cystic and hyperechoic collection measuring 6.5 x 5 x 6.9 cm. No flow is demonstrated in the area on color flow Doppler imaging. Appearance is most consistent with either a hematoma or an abscess. IMPRESSION: Complex collection in the right antecubital fossa measuring up to 6.9 cm diameter. Appearance is most consistent with hematoma or abscess. Electronically Signed   By: Burman Nieves M.D.   On: 09/18/2017 00:25    Scheduled: . collagenase   Topical Daily  . darbepoetin (ARANESP) injection - NON-DIALYSIS  100 mcg Subcutaneous Q Fri-1800  . diphenoxylate-atropine  2 tablet Oral QID  . feeding supplement (PRO-STAT SUGAR FREE 64)  30 mL Oral BID  . insulin aspart  2-6 Units Subcutaneous Q4H  . loperamide  4 mg Oral QID  . mouth rinse  15 mL Mouth Rinse BID  . psyllium  1 packet Oral TID    LOS: 8 days   [name] 09/18/2017,12:42 PM

## 2017-09-18 NOTE — Progress Notes (Signed)
ANTICOAGULATION CONSULT NOTE - Follow Up Consult  Pharmacy Consult for Heparin Indication: pulmonary embolus and DVT  No Known Allergies  Patient Measurements: Height: 5\' 10"  (177.8 cm) Weight: 156 lb 14.4 oz (71.2 kg) IBW/kg (Calculated) : 73 Heparin Dosing Weight: 71 kg  Vital Signs: Temp: 98.3 F (36.8 C) (01/30 0440) Temp Source: Oral (01/30 0440) BP: 139/95 (01/30 0440) Pulse Rate: 87 (01/30 0440)  Labs: Recent Labs    09/16/17 0651 09/17/17 0438 09/17/17 1432 09/18/17 0412  HGB 8.3* 7.7*  --  7.7*  HCT 26.0* 24.1*  --  23.7*  PLT 220 230  --  277  HEPARINUNFRC 0.44 0.22* 0.36 0.35  CREATININE 5.26* 4.67*  --  3.91*    Estimated Creatinine Clearance: 19.5 mL/min (A) (by C-G formula based on SCr of 3.91 mg/dL (H)).  Assessment:   62 yr old male continues on IV heparin for PE and bilateral DVT.   S/p PEA arrest x 2, received TNKase on 09/10/17.    Heparin level remains therapeutic (0.35) on 2150 units/hr. Hgb trended down some. RUE swollen and bruised. Duplex of RUE negative for acute DVT.  No bleeding reported.   Goal of Therapy:  Heparin level 0.3-0.7 units/ml Monitor platelets by anticoagulation protocol: Yes   Plan:   Continue heparin drip at 21500 units/hr.  Daily heparin level and CBC.  Follow up for oral anticoagulant plans.  [name], [name], RPh Pager: [phone] or [phone] 09/18/2017,11:34 AM

## 2017-09-18 NOTE — Plan of Care (Signed)
Pt consumes 75% or greater of meals and snacks following prescribed diet.

## 2017-09-18 NOTE — Progress Notes (Signed)
  Speech Language Pathology Treatment: Dysphagia  Patient Details Name: Caleb Singh MRN: [id number] DOB: [birth year] Today's Date: 09/18/2017 Time: [id number] SLP Time Calculation (min) (ACUTE ONLY): 11 min  Assessment / Plan / Recommendation Clinical Impression  Pt observed with lunch tray consistent with Dys 2 and thin liquid diet without signs of aspiration. Min verbal cues were used to ensure pt was using compensatory strategies, with pt intermittently doing strategies independently. Pt reported persistent dry throat, benefiting from frequent sips of liquid and moistened solids to aid his ability to swallow solid foods. Given pt's adherence to aspiration precautions, tolerance of thin liquids, and persistent trouble manipulating solid foods, recommend pt for Dys 2 and thin liquid diet with intermittent supervision to ensure safe oral intake. SLP will continue to follow to monitor pt's readiness to advance solids and consistency using swallow strategies.        HPI HPI: Pt is a 62 y.o.malewith PMH of PE, tobacco abuse, and recent hospitalization for small bowel perforation from 07/23/2017-08/31/2017 complicated by multisystem failure. Discharged to a SNF 08/31/17, however returned 09/10/17 with DOE and chest tightness, with subsequent respiratory failure from presumed massive PE, with PEA arrest x2 (1/22). Pt intubated 1/22 to 1/24. Failed post-extubation screen. CXR (1/24) revealing interval increase in bibasilar airspace disease and associated effusions. While this may represent atelectasis, infection is not excluded. Findings are worse on the right.       SLP Plan  Continue with current plan of care       Recommendations  Diet recommendations: Dysphagia 2 (fine chop);Thin liquid Liquids provided via: Straw Medication Administration: Crushed with puree Supervision: Patient able to self feed;Intermittent supervision to cue for compensatory strategies Compensations: Slow rate;Small  sips/bites;Clear throat intermittently;Multiple dry swallows after each bite/sip Postural Changes and/or Swallow Maneuvers: Seated upright 90 degrees                Oral Care Recommendations: Oral care BID Follow up Recommendations: Skilled Nursing facility SLP Visit Diagnosis: Dysphagia, oropharyngeal phase (R13.12) Plan: Continue with current plan of care       GO              Caleb Singh SLP Student Clinician   Caleb Singh 09/18/2017, 2:58 PM

## 2017-09-18 NOTE — Progress Notes (Signed)
Patient was approved through [place] for 32 rehab days. [facility] in [place] ([place] contracted facility) has offered a bed for patient. CSW confirmed with [facility] admissions, Alyse Low [phone]). CSW met with patient and gave bed offer; patient is agreeable to admit to [facility]. CSW to follow and support with discharge when medically ready.  Estanislado Emms, [place]

## 2017-09-18 NOTE — Progress Notes (Signed)
Occupational Therapy Treatment Patient Details Name: Caleb Singh MRN: [id number] DOB: 07/14/1956 Today's Date: 09/18/2017    History of present illness Pt is a 62 y/o male admitted from SNF secondary to dyspnea. Pt is a smoker (40 pack years) with hx PE, who suffered from a perforated small bowel on December 24 for which he underwent small bowel resection and subsequently suffered from multisystem failure and required CRRT. Course was c/b E Coli bacteremia, respiratory failure, brief arrest, and intra-abdominal abscess which was drained percutaneously.  He was discharged 1/12 to rehab where he has been having difficulties with chronic dyspnea.  He returned 1/22 with worsening dyspnea and DOE with chest tightness.   He was admitted for respiratory failure and was undergoing w/u to r/o PE. He was in VQ scan when he had worsening dyspnea despite bipap, had rapid decline and ultimately PEA arrest x 2.  He received systemic TPA and TTE suspicious for RA and IVC clot.  He had ongoing profound shock, coagulopathy and significant anemia.    OT comments  Pt demonstrating progress toward OT goals this session. He was able to complete functional mobility from bed to sink with min assist +2 for stability. Following standing rest break, pt stood at sink to brush his teeth with min assist. HR up to 141 with standing and mobility and back down to 118 following seated rest break. Pt demonstrating improving tolerance for activity evidenced by his ability to complete multiple grooming tasks and increased distance of functional mobility. See general comments section for further pertinent vital signs. He continues to be appropriate for SNF level rehabilitation post-acute D/C.    Follow Up Recommendations  SNF    Equipment Recommendations       Recommendations for Other Services      Precautions / Restrictions Precautions Precautions: Fall Precaution Comments: NG tube, colostomy Restrictions Weight Bearing  Restrictions: No       Mobility Bed Mobility Overal bed mobility: Needs Assistance Bed Mobility: Supine to Sit;Sit to Supine     Supine to sit: Min assist;+2 for safety/equipment Sit to supine: Mod assist;+2 for safety/equipment;+2 for physical assistance   General bed mobility comments: Assist on return to bed for BLE.   Transfers Overall transfer level: Needs assistance Equipment used: 2 person hand held assist Transfers: Sit to/from Stand Sit to Stand: +2 safety/equipment;Min assist         General transfer comment: cued and assisted to come foward, but also assisted with boost up.  From chair with arm, pt needed less assist    Balance Overall balance assessment: Needs assistance Sitting-balance support: Feet supported;No upper extremity supported Sitting balance-Leahy Scale: Fair     Standing balance support: Single extremity supported;No upper extremity supported;During functional activity;Bilateral upper extremity supported Standing balance-Leahy Scale: Poor Standing balance comment: Relies on external support.                            ADL either performed or assessed with clinical judgement   ADL Overall ADL's : Needs assistance/impaired     Grooming: Minimal assistance;Standing               Lower Body Dressing: Moderate assistance;Sit to/from stand Lower Body Dressing Details (indicate cue type and reason): Able to assist with cues for single handed dressing techniques. Toilet Transfer: Minimal assistance;+2 for physical assistance;Ambulation Toilet Transfer Details (indicate cue type and reason): simulated with sit<>stand followed by functional mobility in room Toileting- Clothing  Manipulation and Hygiene: Moderate assistance;+2 for physical assistance;Sit to/from stand       Functional mobility during ADLs: Minimal assistance;+2 for physical assistance General ADL Comments: Pt able to complete functional mobility to sink for oral care  tasks. With one standing rest break after ambulation, he was able to tolerate standing to brush his teeth but required seated break following this task. Educated pt on importance of elevation of R UE as well as digit AROM to manage RUE edema     Vision       Perception     Praxis      Cognition Arousal/Alertness: Awake/alert Behavior During Therapy: WFL for tasks assessed/performed;Anxious Overall Cognitive Status: Within Functional Limits for tasks assessed                                 General Comments: Anxious concerning R UE swelling        Exercises General Exercises - Upper Extremity Digit Composite Flexion: PROM;Right;5 reps;Supine Composite Extension: PROM;Right;5 reps;Supine   Shoulder Instructions       General Comments On ambulation to sink, pt with significant dyspnea with HR up to 141. Back down to 118 with seated rest break. On return to bed (ambulating from sink to bed). HR sustained in low 130s. SpO2 on room air 96-97%.     Pertinent Vitals/ Pain       Pain Assessment: Faces Faces Pain Scale: Hurts little more Pain Location: R arm Pain Descriptors / Indicators: Grimacing;Discomfort Pain Intervention(s): Limited activity within patient's tolerance;Monitored during session  Home Living                                          Prior Functioning/Environment              Frequency  Min 2X/week        Progress Toward Goals  OT Goals(current goals can now be found in the care plan section)  Progress towards OT goals: Progressing toward goals  Acute Rehab OT Goals Patient Stated Goal: return [facility] to get stronger OT Goal Formulation: With patient Time For Goal Achievement: 09/27/17 Potential to Achieve Goals: Good  Plan Discharge plan remains appropriate    Co-evaluation    PT/OT/SLP Co-Evaluation/Treatment: Yes Reason for Co-Treatment: For patient/therapist safety PT goals addressed during  session: Mobility/safety with mobility OT goals addressed during session: ADL's and self-care;Strengthening/ROM      AM-PAC PT "6 Clicks" Daily Activity     Outcome Measure   Help from another person eating meals?: A Little Help from another person taking care of personal grooming?: A Little Help from another person toileting, which includes using toliet, bedpan, or urinal?: A Lot Help from another person bathing (including washing, rinsing, drying)?: A Lot Help from another person to put on and taking off regular upper body clothing?: A Little Help from another person to put on and taking off regular lower body clothing?: A Lot 6 Click Score: 15    End of Session Equipment Utilized During Treatment: Gait belt  OT Visit Diagnosis: Muscle weakness (generalized) (M62.81);Unsteadiness on feet (R26.81)   Activity Tolerance Patient tolerated treatment well   Patient Left in chair;with call bell/phone within reach;with family/visitor present   Nurse Communication          Time: [id number] OT Time Calculation (  min): 44 min  Charges: OT General Charges $OT Visit: 1 Visit OT Treatments $Self Care/Home Management : 8-22 mins  Doristine Section, MS [name]  Pager: [phone]    Bevely Hackbart A Jorita Bohanon 09/18/2017, 5:29 PM

## 2017-09-18 NOTE — Plan of Care (Signed)
Pt verbalizes satisfaction with PRN pain medication

## 2017-09-19 DIAGNOSIS — S45991A Other specified injury of unspecified blood vessel at shoulder and upper arm level, right arm, initial encounter: Secondary | ICD-10-CM

## 2017-09-19 DIAGNOSIS — I2602 Saddle embolus of pulmonary artery with acute cor pulmonale: Secondary | ICD-10-CM

## 2017-09-19 DIAGNOSIS — I2699 Other pulmonary embolism without acute cor pulmonale: Secondary | ICD-10-CM

## 2017-09-19 DIAGNOSIS — I469 Cardiac arrest, cause unspecified: Secondary | ICD-10-CM

## 2017-09-19 LAB — HEPATIC FUNCTION PANEL
ALT: 107 U/L — AB (ref 17–63)
AST: 22 U/L (ref 15–41)
Albumin: 1.7 g/dL — ABNORMAL LOW (ref 3.5–5.0)
Alkaline Phosphatase: 122 U/L (ref 38–126)
BILIRUBIN DIRECT: 0.2 mg/dL (ref 0.1–0.5)
BILIRUBIN INDIRECT: 0.5 mg/dL (ref 0.3–0.9)
BILIRUBIN TOTAL: 0.7 mg/dL (ref 0.3–1.2)
Total Protein: 5.5 g/dL — ABNORMAL LOW (ref 6.5–8.1)

## 2017-09-19 LAB — HEPARIN LEVEL (UNFRACTIONATED): HEPARIN UNFRACTIONATED: 0.33 [IU]/mL (ref 0.30–0.70)

## 2017-09-19 LAB — RENAL FUNCTION PANEL
Albumin: 1.7 g/dL — ABNORMAL LOW (ref 3.5–5.0)
Anion gap: 11 (ref 5–15)
BUN: 43 mg/dL — AB (ref 6–20)
CO2: 26 mmol/L (ref 22–32)
Calcium: 7.7 mg/dL — ABNORMAL LOW (ref 8.9–10.3)
Chloride: 99 mmol/L — ABNORMAL LOW (ref 101–111)
Creatinine, Ser: 3.17 mg/dL — ABNORMAL HIGH (ref 0.61–1.24)
GFR calc Af Amer: 22 mL/min — ABNORMAL LOW (ref >60)
GFR calc non Af Amer: 19 mL/min — ABNORMAL LOW (ref >60)
GLUCOSE: 96 mg/dL (ref 65–99)
POTASSIUM: 3.5 mmol/L (ref 3.5–5.1)
Phosphorus: 4.8 mg/dL — ABNORMAL HIGH (ref 2.5–4.6)
SODIUM: 136 mmol/L (ref 135–145)

## 2017-09-19 LAB — CBC
HEMATOCRIT: 24 % — AB (ref 39.0–52.0)
Hemoglobin: 7.9 g/dL — ABNORMAL LOW (ref 13.0–17.0)
MCH: 29.2 pg (ref 26.0–34.0)
MCHC: 32.9 g/dL (ref 30.0–36.0)
MCV: 88.6 fL (ref 78.0–100.0)
Platelets: 324 10*3/uL (ref 150–400)
RBC: 2.71 MIL/uL — ABNORMAL LOW (ref 4.22–5.81)
RDW: 15.3 % (ref 11.5–15.5)
WBC: 14 10*3/uL — ABNORMAL HIGH (ref 4.0–10.5)

## 2017-09-19 LAB — GLUCOSE, CAPILLARY
Glucose-Capillary: 98 mg/dL (ref 65–99)
Glucose-Capillary: 101 mg/dL — ABNORMAL HIGH (ref 65–99)
Glucose-Capillary: 82 mg/dL (ref 65–99)
Glucose-Capillary: 92 mg/dL (ref 65–99)

## 2017-09-19 LAB — PROTIME-INR
INR: 1.44
PROTHROMBIN TIME: 17.5 s — AB (ref 11.4–15.2)

## 2017-09-19 MED ORDER — COUMADIN BOOK
Freq: Once | Status: AC
  Administered 2017-09-19: 17:00:00
  Filled 2017-09-19: qty 1

## 2017-09-19 MED ORDER — WARFARIN - PHARMACIST DOSING INPATIENT: Freq: Every day | Status: DC

## 2017-09-19 MED ORDER — NEPRO/CARBSTEADY PO LIQD
237.0000 mL | Freq: Three times a day (TID) | ORAL | Status: DC
  Administered 2017-09-19: 237 mL via ORAL

## 2017-09-19 MED ORDER — WARFARIN SODIUM 5 MG PO TABS
5.0000 mg | ORAL_TABLET | Freq: Once | ORAL | Status: AC
  Administered 2017-09-19: 5 mg via ORAL
  Filled 2017-09-19: qty 1

## 2017-09-19 MED ORDER — POTASSIUM CHLORIDE 20 MEQ PO PACK
20.0000 meq | PACK | Freq: Two times a day (BID) | ORAL | Status: AC
  Administered 2017-09-19 (×2): 20 meq via ORAL
  Filled 2017-09-19 (×3): qty 1

## 2017-09-19 MED ORDER — STERILE WATER FOR INJECTION IV SOLN
150.0000 meq | INTRAVENOUS | Status: DC
  Administered 2017-09-19 (×2): 150 meq via INTRAVENOUS
  Filled 2017-09-19 (×2): qty 850

## 2017-09-19 NOTE — Progress Notes (Signed)
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 After Francee NodalAKIin December it appears his creatinine nadir was around 2. 2. Hypertension/volume-Reduce IVF further, increase PO liquid 3.Hypokalemia, supp   Subjective: Interval History: Better  Objective: Vital signs in last 24 hours: Temp:  [98 F (36.7 C)-98.7 F (37.1 C)] 98 F (36.7 C) (01/31 0801) Pulse Rate:  [76-97] 90 (01/31 0801) Resp:  [23-26] 23 (01/31 0801) BP: (128-144)/(71-96) 141/85 (01/31 0801) SpO2:  [97 %-100 %] 100 % (01/31 0801) Weight:  [70.1 kg (154 lb 8.7 oz)] 70.1 kg (154 lb 8.7 oz) (01/31 0401) Weight change: -1.069 kg (-5.7 oz)  Intake/Output from previous day: 01/30 0701 - 01/31 0700 In: 2616.1 [P.O.:960; I.V.:1656.1] Out: 3950 [Urine:2350; Stool:1600] Intake/Output this shift: No intake/output data recorded.  General appearance: alert and cooperative Resp: clear to auscultation bilaterally Chest wall: no tenderness Extremities: extremities normal, atraumatic, no cyanosis or edema  abd colost Cor RRR  Lab Results: Recent Labs    09/18/17 0412 09/19/17 0527  WBC 13.7* 14.0*  HGB 7.7* 7.9*  HCT 23.7* 24.0*  PLT 277 324   BMET:  Recent Labs    09/18/17 0412 09/19/17 0527  NA 132* 136  K 4.0 3.5  CL 101 99*  CO2 19* 26  GLUCOSE 91 96  BUN 54* 43*  CREATININE 3.91* 3.17*  CALCIUM 7.7* 7.7*   No results for input(s): PTH in the last 72 hours. Iron Studies: No results for input(s): IRON, TIBC, TRANSFERRIN, FERRITIN in the last 72 hours. Studies/Results: Dg Elbow Complete Right (3+view)  Result Date: 09/18/2017 CLINICAL DATA:  Right elbow pain and swelling for 3 days. No injury. EXAM: RIGHT ELBOW - COMPLETE 3+ VIEW COMPARISON:  None. FINDINGS: There is no evidence of fracture, dislocation, or  joint effusion. There is no evidence of arthropathy or other focal bone abnormality. Soft tissues are unremarkable. IMPRESSION: Negative. Electronically Signed   By: Burman NievesWilliam  Stevens M.D.   On: 09/18/2017 00:22   [place] Rt Upper Extrem Ltd Soft Tissue Non Vascular  Result Date: 09/18/2017 CLINICAL DATA:  Right arm swelling for 4 days. EXAM: ULTRASOUND right UPPER EXTREMITY LIMITED TECHNIQUE: Ultrasound examination of the upper extremity soft tissues was performed in the area of clinical concern. COMPARISON:  None. FINDINGS: Images obtained in the right antecubital fossa demonstrate a complex septated cystic and hyperechoic collection measuring 6.5 x 5 x 6.9 cm. No flow is demonstrated in the area on color flow Doppler imaging. Appearance is most consistent with either a hematoma or an abscess. IMPRESSION: Complex collection in the right antecubital fossa measuring up to 6.9 cm diameter. Appearance is most consistent with hematoma or abscess. Electronically Signed   By: Burman NievesWilliam  Stevens M.D.   On: 09/18/2017 00:25    Scheduled: . collagenase   Topical Daily  . darbepoetin (ARANESP) injection - NON-DIALYSIS  100 mcg Subcutaneous Q Fri-1800  . diphenoxylate-atropine  2 tablet Oral QID  . feeding supplement (PRO-STAT SUGAR FREE 64)  30 mL Oral BID  . insulin aspart  0-9 Units Subcutaneous TID WC  . loperamide  4 mg Oral QID  . mouth rinse  15 mL Mouth Rinse BID  . psyllium  1 packet Oral TID     LOS: 9 days   Lauris PoagAlvin C Rameen Quinney 09/19/2017,10:43 AM

## 2017-09-19 NOTE — Progress Notes (Signed)
CSW continuing to follow for SNF placement. Admissions at [facility] [place] called for update. They continue to have a bed available for patient when medically ready. CSW to follow and support with discharge.   Abigail ButtsSusan Katyra Tomassetti, LCSWA [phone]

## 2017-09-19 NOTE — Consult Note (Signed)
WOC Nurse ostomy follow up Stoma type/location: RLQ, end ileostomy Stomal assessment/size: not measured,budded, pink, moist observed through pouch.  Peristomal assessment: NA Pouch changed successfully per bedside nursing staff Tuesday, seal intact  Treatment options for stomal/peristomal skin: using 2" barrier ring Output brown effluent, getting thicker, high volumes Ostomy pouching: 2pc. High output pouch in place  Education provided:  Did not provide any education today. [facility].  Enrolled patient in [facility] [place] Secure Start Discharge program: Yes, previously We will follow this patient and remain available to this patient, nursing, and the medical and surgical teams.  Barnett HatterMelinda Jahkai Yandell, RN-C, WTA-C, OCA Wound Treatment Associate Ostomy Care Associate

## 2017-09-19 NOTE — Progress Notes (Signed)
Occupational Therapy Treatment Patient Details Name: Caleb LaundryFrederick Inglis MRN: [id number] DOB: 15-Jun-1956 Today's Date: 09/19/2017    History of present illness Pt is a 62 y/o male admitted from SNF secondary to dyspnea. Pt is a smoker (40 pack years) with hx PE, who suffered from a perforated small bowel on December 24 for which he underwent small bowel resection and subsequently suffered from multisystem failure and required CRRT. Course was c/b E Coli bacteremia, respiratory failure, brief arrest, and intra-abdominal abscess which was drained percutaneously.  He was discharged 1/12 to rehab where he has been having difficulties with chronic dyspnea.  He returned 1/22 with worsening dyspnea and DOE with chest tightness.   He was admitted for respiratory failure and was undergoing w/u to r/o PE. He was in VQ scan when he had worsening dyspnea despite bipap, had rapid decline and ultimately PEA arrest x 2.  He received systemic TPA and TTE suspicious for RA and IVC clot.  He had ongoing profound shock, coagulopathy and significant anemia.    OT comments   OT aware that this note says simulataneous filing, but the user was aware and did check the data for accuracy). Pt progressing towards OT goals this session. Session focused on activity tolerance for ADL including education and ROM at the RUE which continues to have edema and be very sore. Pt cooperative, yet anxious throughout requiring +2 for physical assist and safety. SNF placement remains appropriate, and OT will continue to follow acutely.   Follow Up Recommendations  SNF    Equipment Recommendations       Recommendations for Other Services      Precautions / Restrictions Precautions Precautions: Fall(Simultaneous filing. User may not have seen previous data.) Precaution Comments: colostomy(Simultaneous filing. User may not have seen previous data.) Restrictions Weight Bearing Restrictions: No       Mobility Bed Mobility Overal bed  mobility: Needs Assistance(Simultaneous filing. User may not have seen previous data.) Bed Mobility: Supine to Sit;Sit to Supine(Simultaneous filing. User may not have seen previous data.)     Supine to sit: Min assist;HOB elevated(for trunk elevation Simultaneous filing. User may not have seen previous data.) Sit to supine: Mod assist(for assist with BLE back into bed Simultaneous filing. User may not have seen previous data.)   General bed mobility comments: Pt insists on cradeling RUE for pain management(Simultaneous filing. User may not have seen previous data.)  Transfers Overall transfer level: Needs assistance(Simultaneous filing. User may not have seen previous data.) Equipment used: 2 person hand held assist(Simultaneous filing. User may not have seen previous data.) Transfers: Sit to/from Stand(Simultaneous filing. User may not have seen previous data.) Sit to Stand: +2 safety/equipment;Min assist(Simultaneous filing. User may not have seen previous data.)         General transfer comment: vc for sequence and to lean forward, min A +2 for boost. Pt supporting RUE to manage pain during transfers(Simultaneous filing. User may not have seen previous data.)    Balance Overall balance assessment: Needs assistance(Simultaneous filing. User may not have seen previous data.) Sitting-balance support: No upper extremity supported Sitting balance-Leahy Scale: Fair(Simultaneous filing. User may not have seen previous data.) Sitting balance - Comments: sitting EOB with no back support   Standing balance support: Bilateral upper extremity supported;During functional activity(Simultaneous filing. User may not have seen previous data.) Standing balance-Leahy Scale: Poor(Simultaneous filing. User may not have seen previous data.) Standing balance comment: reliant on external support from therapists, vc for upright posture(Simultaneous filing. User may not  have seen previous data.)                            ADL either performed or assessed with clinical judgement   ADL Overall ADL's : Needs assistance/impaired     Grooming: Minimal assistance;Sitting Grooming Details (indicate cue type and reason): cleaning hands after using urinal (could not wait to make it to the toilet)                 Toilet Transfer: Minimal assistance;+2 for physical assistance;Ambulation Toilet Transfer Details (indicate cue type and reason): simulated with sit <> stand (x3) during mobility this session.  Toileting- Clothing Manipulation and Hygiene: Set up;Bed level Toileting - Clothing Manipulation Details (indicate cue type and reason): used warm wash cloth to clean penis after urination     Functional mobility during ADLs: Minimal assistance;+2 for physical assistance       Vision       Perception     Praxis      Cognition Arousal/Alertness: Awake/alert(Simultaneous filing. User may not have seen previous data.) Behavior During Therapy: [facility] for tasks assessed/performed;Anxious(Simultaneous filing. User may not have seen previous data.) Overall Cognitive Status: Within Functional Limits for tasks assessed(Simultaneous filing. User may not have seen previous data.)                                 General Comments: generalized anxiety about overall health and RUE in particular(Simultaneous filing. User may not have seen previous data.)        Exercises Exercises: Other exercises General Exercises - Upper Extremity Elbow Flexion: AROM;Right;5 reps;Seated(Pt "stuck" at about 90 degrees, encouraged movement) Elbow Extension: AROM;Right;5 reps;Seated(Pt "stuck" at about 90 degrees, encouraged movement) Wrist Flexion: AROM;Right;5 reps Wrist Extension: AROM;Right;5 reps Digit Composite Flexion: AROM;Right;5 reps Composite Extension: AROM;Right;5 reps Other Exercises Other Exercises: Reinforced to pt that R arm being propped up is good, but it still has to be  mobilized.  Stressed ROM at T elbow due to pt showing no greater than a 10* ROM   Shoulder Instructions       General Comments on RA, Pt's O2 read 99 during seated rest break while ambulating. Pt's HR spiked in the 140's just prior to return to bed, and upon sitting returned to baseline.(Simultaneous filing. User may not have seen previous data.)    Pertinent Vitals/ Pain       Pain Assessment: Faces(Simultaneous filing. User may not have seen previous data.) Faces Pain Scale: Hurts even more(Simultaneous filing. User may not have seen previous data.) Pain Location: R arm(Simultaneous filing. User may not have seen previous data.) Pain Descriptors / Indicators: Grimacing;Discomfort(Simultaneous filing. User may not have seen previous data.) Pain Intervention(s): Monitored during session;Repositioned;Other (comment)(encouraged ROM at the joints, elevation Simultaneous filing. User may not have seen previous data.)  Home Living                                          Prior Functioning/Environment              Frequency  Min 2X/week        Progress Toward Goals  OT Goals(current goals can now be found in the care plan section)  Progress towards OT goals: Progressing toward goals  Acute Rehab OT Goals  Patient Stated Goal: return to [name] to get stronger(Simultaneous filing. User may not have seen previous data.) OT Goal Formulation: With patient Time For Goal Achievement: 09/27/17 Potential to Achieve Goals: Good  Plan Discharge plan remains appropriate    Co-evaluation    PT/OT/SLP Co-Evaluation/Treatment: Yes Reason for Co-Treatment: For patient/therapist safety(Simultaneous filing. User may not have seen previous data.) PT goals addressed during session: Mobility/safety with mobility(Simultaneous filing. User may not have seen previous data.) OT goals addressed during session: ADL's and self-care      AM-PAC PT "6 Clicks" Daily Activity      Outcome Measure   Help from another person eating meals?: A Little Help from another person taking care of personal grooming?: A Little Help from another person toileting, which includes using toliet, bedpan, or urinal?: A Lot Help from another person bathing (including washing, rinsing, drying)?: A Lot Help from another person to put on and taking off regular upper body clothing?: A Little Help from another person to put on and taking off regular lower body clothing?: A Lot 6 Click Score: 15    End of Session Equipment Utilized During Treatment: Gait belt  OT Visit Diagnosis: Muscle weakness (generalized) (M62.81);Unsteadiness on feet (R26.81)   Activity Tolerance Patient tolerated treatment well   Patient Left in bed;with call bell/phone within reach;with nursing/sitter in room   Nurse Communication          Time: 1610-9604 OT Time Calculation (min): 23 min  Charges: OT General Charges $OT Visit: 1 Visit OT Treatments $Therapeutic Activity: 8-22 mins  Sherryl Manges [name] [phone]   Evern Bio Emeline Simpson 09/19/2017, 5:27 PM

## 2017-09-19 NOTE — Progress Notes (Addendum)
Nutrition Follow-up  DOCUMENTATION CODES:   Non-severe (moderate) malnutrition in context of acute illness/injury  INTERVENTION:    Nepro Shake po TID, each supplement provides 425 kcal and 19 grams protein  NUTRITION DIAGNOSIS:   Moderate Malnutrition related to acute illness(recent SBO s/p surgery) as evidenced by mild fat depletion, mild muscle depletion.  Ongoing   GOAL:   Patient will meet greater than or equal to 90% of their needs  Unmet  MONITOR:   PO intake, Supplement acceptance, Diet advancement  ASSESSMENT:   62 yo male with PMH of PE, tobacco abuse, recent hospitalization for bowel obstruction (d/c to SNF on 1/12), pneumothorax, colostomy who was admitted on 1/22 with submassive PE; had PEA arrest during attempt at EKOS. Required intubation from 1/22-1/24.  TF off since 1/28. Diet advanced to dysphagia 2 with thin liquids.  Spoke with patient, who is upset that his diet is limited to dysphagia 2 (minced) items. He is requesting items such as bread, sandwiches, potato salad, and chicken noodle soup, which are not allowed on the dysphagia 2 diet. Discussed with SLP; plans to continue dysphagia 2 diet today, and SLP needs to follow-up prior to advancing diet. Patient verbalized understanding. RD and nutritional services ambassador assisted patient with his meal orders for lunch and dinner today. Labs reviewed. Phosphorus 4.8 (H) Medications reviewed and include KCl.  Diet Order:  DIET DYS 2 Room service appropriate? Yes; Fluid consistency: Thin  EDUCATION NEEDS:   No education needs have been identified at this time  Skin:  Skin Assessment: Skin Integrity Issues: Skin Integrity Issues:: Other (Comment) Stage II: N/A Incisions: abdomen Other: MASD to scrotum  Last BM:  1/30 (ileostomy)  Height:   Ht Readings from Last 1 Encounters:  09/10/17 5\' 10"  (1.778 m)    Weight:   Wt Readings from Last 1 Encounters:  09/19/17 154 lb 8.7 oz (70.1 kg)     Ideal Body Weight:  75.5 kg  BMI:  Body mass index is 22.17 kg/m.  Estimated Nutritional Needs:   Kcal:  2100-2300  Protein:  120-135 gm  Fluid:  2.1-2.3 L   Joaquin CourtsKimberly Harris, RD, LDN, CNSC Pager [phone] After Hours Pager [phone]

## 2017-09-19 NOTE — Progress Notes (Addendum)
ANTICOAGULATION CONSULT NOTE - Follow Up Consult  Pharmacy Consult for Heparin Indication: pulmonary embolus and DVT  No Known Allergies  Patient Measurements: Height: 5\' 10"  (177.8 cm) Weight: 154 lb 8.7 oz (70.1 kg) IBW/kg (Calculated) : 73 Heparin Dosing Weight: 71 kg  Vital Signs: Temp: 98 F (36.7 C) (01/31 0801) Temp Source: Oral (01/31 0801) BP: 141/85 (01/31 0801) Pulse Rate: 90 (01/31 0801)  Labs: Recent Labs    09/17/17 0438 09/17/17 1432 09/18/17 0412 09/19/17 0527 09/19/17 0900  HGB 7.7*  --  7.7* 7.9*  --   HCT 24.1*  --  23.7* 24.0*  --   PLT 230  --  277 324  --   LABPROT  --   --   --   --  17.5*  INR  --   --   --   --  1.44  HEPARINUNFRC 0.22* 0.36 0.35 0.33  --   CREATININE 4.67*  --  3.91* 3.17*  --     Estimated Creatinine Clearance: 23.6 mL/min (A) (by C-G formula based on SCr of 3.17 mg/dL (H)).    Assessment: [name] continues on IV heparin for PE, bilateral DVT and RA/IVC thrombus.  He is s/p PEA arrest x2 and received TNKase on 09/10/17.  Heparin level remains low therapeutic.  No bleeding reported.   Goal of Therapy:  Heparin level 0.3-0.7 units/ml Monitor platelets by anticoagulation protocol: Yes    Plan:  Continue heparin gtt at 2150 units/hr Daily heparin level and CBC F/U oral AC plans   Abyan Cadman D. Laney Potashang, PharmD, BCPS Pager:  [phone] 09/19/2017, 10:24 AM    ==========================   Addendum:  Add Coumadin (Overlap D#1/5) INR was 4.02 on 09/11/17 while patient had shocked liver.  It decreased to 1.44 today. Albumin low at 1.7   Plan: Coumadin 5mg  PO today Daily PT / INR Coumadin book Monitor closely for s/sx of bleeding   Shammara Jarrett D. Laney Potashang, PharmD, BCPS Pager:  [phone] 09/19/2017, 10:43 AM

## 2017-09-19 NOTE — Progress Notes (Signed)
ANTICOAGULATION CONSULT NOTE - Follow Up Consult  Pharmacy Consult for Heparin Indication: pulmonary embolus and DVT  No Known Allergies  Patient Measurements: Height: 5\' 10"  (177.8 cm) Weight: 154 lb 8.7 oz (70.1 kg) IBW/kg (Calculated) : 73 Heparin Dosing Weight: 71 kg  Vital Signs: Temp: 98.4 F (36.9 C) (01/31 0401) BP: 128/77 (01/31 0401) Pulse Rate: 76 (01/31 0401)  Labs: Recent Labs    09/17/17 0438 09/17/17 1432 09/18/17 0412 09/19/17 0527  HGB 7.7*  --  7.7* 7.9*  HCT 24.1*  --  23.7* 24.0*  PLT 230  --  277 324  HEPARINUNFRC 0.22* 0.36 0.35 0.33  CREATININE 4.67*  --  3.91* 3.17*    Estimated Creatinine Clearance: 23.6 mL/min (A) (by C-G formula based on SCr of 3.17 mg/dL (H)).    Assessment: [name] continues on IV heparin for PE and bilateral DVT.  He is s/p PEA arrest x2 and received TNKase on 09/10/17.  Heparin level remains low therapeutic.  No bleeding reported.   Goal of Therapy:  Heparin level 0.3-0.7 units/ml Monitor platelets by anticoagulation protocol: Yes    Plan:  Continue heparin gtt at 2150 units/hr Daily heparin level and CBC F/U oral AC plans   Zebulon Gantt D. Laney Potashang, PharmD, BCPS Pager:  [phone] 09/19/2017, 7:57 AM

## 2017-09-19 NOTE — Progress Notes (Signed)
Physical Therapy Treatment Patient Details Name: Caleb Singh MRN: [id number] DOB: 1956/02/14 Today's Date: 09/19/2017    History of Present Illness Pt is a 62 y/o male admitted from SNF secondary to dyspnea. Pt is a smoker (40 pack years) with hx PE, who suffered from a perforated small bowel on December 24 for which he underwent small bowel resection and subsequently suffered from multisystem failure and required CRRT. Course was c/b E Coli bacteremia, respiratory failure, brief arrest, and intra-abdominal abscess which was drained percutaneously.  He was discharged 1/12 to rehab where he has been having difficulties with chronic dyspnea.  He returned 1/22 with worsening dyspnea and DOE with chest tightness.   He was admitted for respiratory failure and was undergoing w/u to r/o PE. He was in VQ scan when he had worsening dyspnea despite bipap, had rapid decline and ultimately PEA arrest x 2.  He received systemic TPA and TTE suspicious for RA and IVC clot.  He had ongoing profound shock, coagulopathy and significant anemia. (Simultaneous filing. User may not have seen previous data.)    PT Comments    Pt needing encouragement to mobilize  OOB and to start using and exercising his R arm.  Pt able to get out into the hall today.  Sats remained adequate overall.  EHR rose into the 120's to 142 max.   Follow Up Recommendations  SNF     Equipment Recommendations  None recommended by PT    Recommendations for Other Services       Precautions / Restrictions Precautions Precautions: Fall(Simultaneous filing. User may not have seen previous data.) Precaution Comments: colostomy(Simultaneous filing. User may not have seen previous data.) Restrictions Weight Bearing Restrictions: No    Mobility  Bed Mobility Overal bed mobility: Needs Assistance(Simultaneous filing. User may not have seen previous data.) Bed Mobility: Supine to Sit;Sit to Supine(Simultaneous filing. User may not have seen  previous data.)     Supine to sit: Min assist;HOB elevated(for trunk elevation Simultaneous filing. User may not have seen previous data.) Sit to supine: Mod assist(for assist with BLE back into bed Simultaneous filing. User may not have seen previous data.)   General bed mobility comments: Pt insists on cradeling RUE for pain management(Simultaneous filing. User may not have seen previous data.)  Transfers Overall transfer level: Needs assistance(Simultaneous filing. User may not have seen previous data.) Equipment used: 2 person hand held assist(Simultaneous filing. User may not have seen previous data.) Transfers: Sit to/from Stand(Simultaneous filing. User may not have seen previous data.) Sit to Stand: +2 safety/equipment;Min assist(Simultaneous filing. User may not have seen previous data.)         General transfer comment: vc for sequence and to lean forward, min A +2 for boost. Pt supporting RUE to manage pain during transfers(Simultaneous filing. User may not have seen previous data.)  Ambulation/Gait Ambulation/Gait assistance: Min assist;+2 physical assistance;+2 safety/equipment Ambulation Distance (Feet): 35 Feet(and an addidtional 45 feet with 2 person assist) Assistive device: 2 person hand held assist Gait Pattern/deviations: Step-through pattern Gait velocity: decr Gait velocity interpretation: Below normal speed for age/gender General Gait Details: gemerally unsteady with weak-kneed, flat-footed gait.  pt's upper body rigid in attempt to hold r UE steady.   Stairs            Wheelchair Mobility    Modified Rankin (Stroke Patients Only)       Balance Overall balance assessment: Needs assistance(Simultaneous filing. User may not have seen previous data.) Sitting-balance support: No upper extremity  supported Sitting balance-Leahy Scale: Fair(Simultaneous filing. User may not have seen previous data.) Sitting balance - Comments: sitting EOB with no back  support   Standing balance support: Bilateral upper extremity supported;During functional activity(Simultaneous filing. User may not have seen previous data.) Standing balance-Leahy Scale: Poor(Simultaneous filing. User may not have seen previous data.) Standing balance comment: reliant on external support from therapists, vc for upright posture(Simultaneous filing. User may not have seen previous data.)                            Cognition Arousal/Alertness: Awake/alert(Simultaneous filing. User may not have seen previous data.) Behavior During Therapy: [facility] for tasks assessed/performed;Anxious(Simultaneous filing. User may not have seen previous data.) Overall Cognitive Status: Within Functional Limits for tasks assessed(Simultaneous filing. User may not have seen previous data.)                                 General Comments: generalized anxiety about overall health and RUE in particular(Simultaneous filing. User may not have seen previous data.)      Exercises General Exercises - Upper Extremity Elbow Flexion: AROM;Right;5 reps;Seated(Pt "stuck" at about 90 degrees, encouraged movement) Elbow Extension: AROM;Right;5 reps;Seated(Pt "stuck" at about 90 degrees, encouraged movement) Wrist Flexion: AROM;Right;5 reps Wrist Extension: AROM;Right;5 reps Digit Composite Flexion: AROM;Right;5 reps Composite Extension: AROM;Right;5 reps Other Exercises Other Exercises: Reinforced to pt that R arm being propped up is good, but it still has to be mobilized.  Stressed ROM at T elbow due to pt showing no greater than a 10* ROM    General Comments General comments (skin integrity, edema, etc.): on RA, Pt's O2 read 99 during seated rest break while ambulating. Pt's HR spiked in the 140's just prior to return to bed, and upon sitting returned to baseline.(Simultaneous filing. User may not have seen previous data.)      Pertinent Vitals/Pain Pain Assessment:  Faces(Simultaneous filing. User may not have seen previous data.) Faces Pain Scale: Hurts even more(Simultaneous filing. User may not have seen previous data.) Pain Location: R arm(Simultaneous filing. User may not have seen previous data.) Pain Descriptors / Indicators: Grimacing;Discomfort(Simultaneous filing. User may not have seen previous data.) Pain Intervention(s): Monitored during session;Repositioned;Other (comment)(encouraged ROM at the joints, elevation Simultaneous filing. User may not have seen previous data.)    Home Living                      Prior Function            PT Goals (current goals can now be found in the care plan section) Acute Rehab PT Goals Patient Stated Goal: return to [name] to get stronger(Simultaneous filing. User may not have seen previous data.) PT Goal Formulation: With patient Time For Goal Achievement: 09/27/17 Potential to Achieve Goals: Good Progress towards PT goals: Progressing toward goals    Frequency    Min 2X/week      PT Plan Current plan remains appropriate    Co-evaluation PT/OT/SLP Co-Evaluation/Treatment: Yes Reason for Co-Treatment: For patient/therapist safety(Simultaneous filing. User may not have seen previous data.) PT goals addressed during session: Mobility/safety with mobility(Simultaneous filing. User may not have seen previous data.) OT goals addressed during session: ADL's and self-care      AM-PAC PT "6 Clicks" Daily Activity  Outcome Measure  Difficulty turning over in bed (including adjusting bedclothes, sheets and blankets)?: A  Little Difficulty moving from lying on back to sitting on the side of the bed? : Unable Difficulty sitting down on and standing up from a chair with arms (e.g., wheelchair, bedside commode, etc,.)?: Unable Help needed moving to and from a bed to chair (including a wheelchair)?: A Little Help needed walking in hospital room?: A Little Help needed climbing 3-5 steps  with a railing? : A Little 6 Click Score: 14    End of Session   Activity Tolerance: Patient limited by fatigue Patient left: in bed;with call bell/phone within reach;with bed alarm set Nurse Communication: Mobility status PT Visit Diagnosis: Other abnormalities of gait and mobility (R26.89);Muscle weakness (generalized) (M62.81)     Time: [id number] PT Time Calculation (min) (ACUTE ONLY): 23 min  Charges:  $Gait Training: 8-22 mins                    G Codes:       09/19/2017  Forsyth BingKen Jaser Fullen, PT [phone]  (pager)   Eliseo GumKenneth V Deiondra Denley 09/19/2017, 5:26 PM

## 2017-09-19 NOTE — Progress Notes (Addendum)
PROGRESS NOTE    Caleb Singh  ZOX:[id number] DOB: 12/23/1955 DOA: 09/10/2017 PCP: Patient, No Pcp Per     Brief Narrative:  Caleb Singh is a 63 year old Caucasian male with past medical history of smoking, history of PE, who suffered from a perforated small bowel in December for which he underwent small bowel resection complicated by multisystem failure requiring CRRT.  Recovery was also complicated by E. coli bacteremia, respiratory failure, brief arrest and intra-abdominal abscess which was drained percutaneously.  He was discharged on 1/12 to rehab.  On 1/22 started having worsening shortness of breath.  He was undergoing workup to rule out PE and while he was in VQ scan he had worsening dyspnea with a rapid decline and then had PEA arrest.  He received systemic tPA due to strong suspicion for pulmonary embolism.  Transthoracic echocardiogram was suspicious for right atrial and IVC blood clot.  He was admitted to the intensive care unit. Now he has stabilized and then transferred to the floor. Nephrology has been following for acute renal failure.   Assessment & Plan:   Principal Problem:   Pulmonary embolism (HCC) Active Problems:   AKI (acute kidney injury) (HCC)   Acute respiratory failure (HCC)   Intestinal perforation s/p SB resection/ileostomy 07/24/2017 & 07/25/2017   Ileostomy in place [facility])   Lactic acidosis   Malnutrition of moderate degree   Cardiac arrest (HCC)   Intramural hematoma of artery of right upper extremity  Acute hypoxic respiratory failure  Most likely secondary to massive PE.  Patient was intubated in the intensive care unit.  Extubated on 1/24. He seems to be stable from a respiratory standpoint without any new symptoms. Stable on room air today and no respiratory distress or conversational dyspnea.   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  Slowly improving.  Nephrology is following and patient has been started on IV fluids.  Renal function worsened  most likely due to high ostomy output.  Patient has required CRRT during previous hospitalization.    High ostomy output/Recent perforated small bowel 12/24 with intra-abd abscess Patient noted to have significant output from his ostomy. CT of abdomen pelvis does not show anything concerning.  No intra-abdominal fluid collection noted.  Reason for high output from his ostomy not clear.  Could be due to short gut syndrome.  Dr. Rito Ehrlich discussed with general surgery.  They recommended fiber and Imodium. Output has decreased last 48 hours from nearly 4-5L daily to 1-1.5L. Monitor.    Suspect massive pulmonary embolism/RA/IVC thrombus/acute lower extremity DVT F/u echo did no longer visualized RA thrombus.  DVT noted bilateral lower extremities. S/p systemic thrombolytics 1/22. Holding home norvasc. Heparin gtt per pharmacy. Will initiate coumadin for disposition. Due to his CKD as well as previous need for CRRT, coumadin may be best option for him. Patient states he was on coumadin years ago when he had a previous PE   Shock - cardiogenic/PEA arrest Resolved. Has been off of pressors.   Right arm swelling He has had swelling of his right upper extremity for the past many days.  He also has bruising in the right arm.  He has good radial pulses.  [place] negative for DVT but US revealed complex collection of fluid hematoma vs abscess.  As patient has not had any fevers or worsening leukocytosis, this is most likely a hematoma. Size remains stable and is softer on examination. Continue to elevate   Elevated troponin/history of bradyarrhythmia Patient seen by cardiology for elevated troponin.  Thought to be secondary to all of the other underlying issues.  No evidence for ischemia.  Previous admission he did require temporary pacing wire.  No bradyarrhythmias noted during this hospitalization yet.  Transaminitis Most likely due to shock liver. Improved.   Normocytic anemia with likely component of  acute blood loss  Stable  Recent hospitalization for intra-abdominal abscess and E. coli bacteremia  Blood cultures have been negative however patient's pro calcitonin was noted to be significantly elevated at 50.13.  Repeat levels show improvement to 6.87.  Was on meropenem empirically.  Antibiotics discontinued 1/27.  CT scan did not show any intra-abdominal infectious etiology.  Blood cultures have been negative.  He remains afebrile.  WBC has been elevated but stable  Hyperglycemia  SSI. CBG's stable.   Acute metabolic encephalopathy Back to baseline  Dysphagia/Nutrition Diet has been advanced.  Tube feedings were discontinued on 1/28.  Malnutrition of moderate degree Encourage oral intake.   DVT prophylaxis: Heparin drip and coumadin  Code Status: Partial code Family Communication: No family at bedside Disposition Plan: Skilled nursing facility when stable, possibly 2/1    Consultants:   PCCM  Cardiology  Nephrology   Antimicrobials:  Anti-infectives (From admission, onward)   Start     Dose/Rate Route Frequency Ordered Stop   09/10/17 1500  meropenem (MERREM) 500 mg in sodium chloride 0.9 % 50 mL IVPB     500 mg 100 mL/hr over 30 Minutes Intravenous Every 12 hours 09/10/17 1434 09/15/17 0425       Subjective: No new issues.   Objective: Vitals:   09/19/17 0401 09/19/17 0801 09/19/17 0819 09/19/17 1100  BP: 128/77 (!) 141/85  (!) 156/97  Pulse: 76 90 92 87  Resp: (!) 26 (!) 23 (!) 28 (!) 26  Temp: 98.4 F (36.9 C) 98 F (36.7 C)  (!) 97.5 F (36.4 C)  TempSrc:  Oral  Oral  SpO2:  100% 100% 100%  Weight: 70.1 kg (154 lb 8.7 oz)     Height:        Intake/Output Summary (Last 24 hours) at 09/19/2017 1226 Last data filed at 09/19/2017 0900 Gross per 24 hour  Intake 2713.46 ml  Output 4550 ml  Net -1836.54 ml   Filed Weights   09/17/17 0517 09/18/17 0440 09/19/17 0401  Weight: 70.6 kg (155 lb 10.3 oz) 71.2 kg (156 lb 14.4 oz) 70.1 kg (154 lb  8.7 oz)    Examination:  General exam: Appears calm and comfortable  Respiratory system: Clear to auscultation. Respiratory effort normal. Cardiovascular system: S1 & S2 heard, RRR. No JVD, murmurs, rubs, gallops or clicks. No pedal edema. Gastrointestinal system: Abdomen is nondistended, soft and nontender. No organomegaly or masses felt. Normal bowel sounds heard.  Ostomy in place Central nervous system: Alert and oriented. No focal neurological deficits. Extremities: + Right upper extremity with swelling and bruising, similar in size from yesterday's exam, softer  Skin: No rashes, lesions or ulcers Psychiatry: Judgement and insight appear normal. Mood & affect appropriate.   Data Reviewed: I have personally reviewed following labs and imaging studies  CBC: Recent Labs  Lab 09/15/17 0744 09/16/17 0651 09/17/17 0438 09/18/17 0412 09/19/17 0527  WBC 14.5* 12.3* 14.1* 13.7* 14.0*  HGB 8.6* 8.3* 7.7* 7.7* 7.9*  HCT 27.5* 26.0* 24.1* 23.7* 24.0*  MCV 90.8 91.9 89.9 89.1 88.6  PLT 185 220 230 277 324   Basic Metabolic Panel: Recent Labs  Lab 09/13/17 0400  09/13/17 1050 09/13/17 1905 09/14/17 0459 09/15/17  4098 09/16/17 1191 09/17/17 0438 09/18/17 0412 09/19/17 0527  NA 138  --   --   --  139 143 142 137 132* 136  K 3.7  --   --   --  3.2* 3.2* 3.3* 3.7 4.0 3.5  CL 92*  --   --   --  97* 103 104 102 101 99*  CO2 30  --   --   --  27 24 24 23  19* 26  GLUCOSE 93  --   --   --  119* 126* 116* 92 91 96  BUN 54*  --   --   --  55* 63* 65* 60* 54* 43*  CREATININE 4.21*  --   --   --  4.99* 5.54* 5.26* 4.67* 3.91* 3.17*  CALCIUM 6.6*  --   --   --  7.0* 7.7* 7.9* 7.8* 7.7* 7.7*  MG 1.7  --  1.8 2.0 1.9  --   --   --   --   --   PHOS  --    < > 6.6* 6.6* 5.9*  6.0* 5.7* 5.0* 4.4 4.8* 4.8*   < > = values in this interval not displayed.   GFR: Estimated Creatinine Clearance: 23.6 mL/min (A) (by C-G formula based on SCr of 3.17 mg/dL (H)). Liver Function Tests: Recent Labs    Lab 09/13/17 0400  09/15/17 0744 09/16/17 0651 09/17/17 0438 09/18/17 0412 09/19/17 0527  AST 1,411*  --   --   --  35  --  22  ALT 1,701*  --   --   --  236*  --  107*  ALKPHOS 185*  --   --   --  128*  --  122  BILITOT 1.4*  --   --   --  1.0  --  0.7  PROT 5.2*  --   --   --  6.0*  --  5.5*  ALBUMIN 1.7*   < > 1.7* 1.8* 1.8*  1.8* 1.8* 1.7*  1.7*   < > = values in this interval not displayed.   No results for input(s): LIPASE, AMYLASE in the last 168 hours. No results for input(s): AMMONIA in the last 168 hours. Coagulation Profile: Recent Labs  Lab 09/19/17 0900  INR 1.44   Cardiac Enzymes: Recent Labs  Lab 09/12/17 1438 09/12/17 2201  TROPONINI 1.62* 1.08*   BNP (last 3 results) No results for input(s): PROBNP in the last 8760 hours. HbA1C: No results for input(s): HGBA1C in the last 72 hours. CBG: Recent Labs  Lab 09/18/17 0818 09/18/17 1144 09/18/17 1707 09/18/17 2118 09/19/17 0759  GLUCAP 97 107* 107* 127* 98   Lipid Profile: No results for input(s): CHOL, HDL, LDLCALC, TRIG, CHOLHDL, LDLDIRECT in the last 72 hours. Thyroid Function Tests: No results for input(s): TSH, T4TOTAL, FREET4, T3FREE, THYROIDAB in the last 72 hours. Anemia Panel: No results for input(s): VITAMINB12, FOLATE, FERRITIN, TIBC, IRON, RETICCTPCT in the last 72 hours. Sepsis Labs: Recent Labs  Lab 09/15/17 0744 09/16/17 0651  PROCALCITON 15.48 6.87    Recent Results (from the past 240 hour(s))  Blood culture (routine x 2)     Status: None   Collection Time: 09/10/17 12:14 PM  Result Value Ref Range Status   Specimen Description BLOOD RIGHT FOREARM  Final   Special Requests   Final    BOTTLES DRAWN AEROBIC AND ANAEROBIC Blood Culture adequate volume   Culture NO GROWTH 5 DAYS  Final  Report Status 09/15/2017 FINAL  Final  Blood culture (routine x 2)     Status: None   Collection Time: 09/10/17 12:30 PM  Result Value Ref Range Status   Specimen Description BLOOD RIGHT  ANTECUBITAL  Final   Special Requests   Final    BOTTLES DRAWN AEROBIC AND ANAEROBIC Blood Culture adequate volume   Culture NO GROWTH 5 DAYS  Final   Report Status 09/15/2017 FINAL  Final  Urine culture     Status: None   Collection Time: 09/12/17 12:48 AM  Result Value Ref Range Status   Specimen Description URINE, RANDOM  Final   Special Requests NONE  Final   Culture NO GROWTH  Final   Report Status 09/13/2017 FINAL  Final  MRSA PCR Screening     Status: None   Collection Time: 09/12/17  3:29 PM  Result Value Ref Range Status   MRSA by PCR NEGATIVE NEGATIVE Final    Comment:        The GeneXpert MRSA Assay (FDA approved for NASAL specimens only), is one component of a comprehensive MRSA colonization surveillance program. It is not intended to diagnose MRSA infection nor to guide or monitor treatment for MRSA infections.        Radiology Studies: Dg Elbow Complete Right (3+view)  Result Date: 09/18/2017 CLINICAL DATA:  Right elbow pain and swelling for 3 days. No injury. EXAM: RIGHT ELBOW - COMPLETE 3+ VIEW COMPARISON:  None. FINDINGS: There is no evidence of fracture, dislocation, or joint effusion. There is no evidence of arthropathy or other focal bone abnormality. Soft tissues are unremarkable. IMPRESSION: Negative. Electronically Signed   By: Burman NievesWilliam  Stevens M.D.   On: 09/18/2017 00:22   [place] Rt Upper Extrem Ltd Soft Tissue Non Vascular  Result Date: 09/18/2017 CLINICAL DATA:  Right arm swelling for 4 days. EXAM: ULTRASOUND right UPPER EXTREMITY LIMITED TECHNIQUE: Ultrasound examination of the upper extremity soft tissues was performed in the area of clinical concern. COMPARISON:  None. FINDINGS: Images obtained in the right antecubital fossa demonstrate a complex septated cystic and hyperechoic collection measuring 6.5 x 5 x 6.9 cm. No flow is demonstrated in the area on color flow Doppler imaging. Appearance is most consistent with either a hematoma or an abscess.  IMPRESSION: Complex collection in the right antecubital fossa measuring up to 6.9 cm diameter. Appearance is most consistent with hematoma or abscess. Electronically Signed   By: Burman NievesWilliam  Stevens M.D.   On: 09/18/2017 00:25      Scheduled Meds: . collagenase   Topical Daily  . coumadin book   Does not apply Once  . darbepoetin (ARANESP) injection - NON-DIALYSIS  100 mcg Subcutaneous Q Fri-1800  . diphenoxylate-atropine  2 tablet Oral QID  . feeding supplement (NEPRO CARB STEADY)  237 mL Oral TID BM  . feeding supplement (PRO-STAT SUGAR FREE 64)  30 mL Oral BID  . insulin aspart  0-9 Units Subcutaneous TID WC  . loperamide  4 mg Oral QID  . mouth rinse  15 mL Mouth Rinse BID  . potassium chloride  20 mEq Oral BID  . psyllium  1 packet Oral TID  . warfarin  5 mg Oral ONCE-1800  . Warfarin - Pharmacist Dosing Inpatient   Does not apply q1800   Continuous Infusions: . heparin 2,150 Units/hr (09/19/17 [id number])  .  sodium bicarbonate (isotonic) infusion in sterile water 150 mEq (09/19/17 1102)     LOS: 9 days    Time spent: 30 minutes  Noralee Stain, DO [facility] [email or web address] Password [facility] 09/19/2017, 12:26 PM

## 2017-09-19 NOTE — Discharge Instructions (Signed)

## 2017-09-20 LAB — RENAL FUNCTION PANEL
ALBUMIN: 1.8 g/dL — AB (ref 3.5–5.0)
ANION GAP: 11 (ref 5–15)
BUN: 34 mg/dL — ABNORMAL HIGH (ref 6–20)
CALCIUM: 7.8 mg/dL — AB (ref 8.9–10.3)
CO2: 29 mmol/L (ref 22–32)
Chloride: 98 mmol/L — ABNORMAL LOW (ref 101–111)
Creatinine, Ser: 2.71 mg/dL — ABNORMAL HIGH (ref 0.61–1.24)
GFR calc non Af Amer: 23 mL/min — ABNORMAL LOW (ref >60)
GFR, EST AFRICAN AMERICAN: 27 mL/min — AB (ref >60)
Glucose, Bld: 94 mg/dL (ref 65–99)
PHOSPHORUS: 4.1 mg/dL (ref 2.5–4.6)
Potassium: 3.5 mmol/L (ref 3.5–5.1)
SODIUM: 138 mmol/L (ref 135–145)

## 2017-09-20 LAB — CBC
HEMATOCRIT: 23.2 % — AB (ref 39.0–52.0)
Hemoglobin: 7.6 g/dL — ABNORMAL LOW (ref 13.0–17.0)
MCH: 29.6 pg (ref 26.0–34.0)
MCHC: 32.8 g/dL (ref 30.0–36.0)
MCV: 90.3 fL (ref 78.0–100.0)
Platelets: 386 10*3/uL (ref 150–400)
RBC: 2.57 MIL/uL — AB (ref 4.22–5.81)
RDW: 15.6 % — AB (ref 11.5–15.5)
WBC: 15.9 10*3/uL — AB (ref 4.0–10.5)

## 2017-09-20 LAB — PROTIME-INR
INR: 1.4
Prothrombin Time: 17 seconds — ABNORMAL HIGH (ref 11.4–15.2)

## 2017-09-20 LAB — GLUCOSE, CAPILLARY
GLUCOSE-CAPILLARY: 113 mg/dL — AB (ref 65–99)
GLUCOSE-CAPILLARY: 156 mg/dL — AB (ref 65–99)
GLUCOSE-CAPILLARY: 95 mg/dL (ref 65–99)
Glucose-Capillary: 106 mg/dL — ABNORMAL HIGH (ref 65–99)

## 2017-09-20 LAB — HEPARIN LEVEL (UNFRACTIONATED): Heparin Unfractionated: 0.29 IU/mL — ABNORMAL LOW (ref 0.30–0.70)

## 2017-09-20 MED ORDER — POTASSIUM CHLORIDE CRYS ER 20 MEQ PO TBCR
20.0000 meq | EXTENDED_RELEASE_TABLET | Freq: Two times a day (BID) | ORAL | Status: AC
  Administered 2017-09-20 (×2): 20 meq via ORAL
  Filled 2017-09-20 (×2): qty 1

## 2017-09-20 MED ORDER — WARFARIN SODIUM 5 MG PO TABS
5.0000 mg | ORAL_TABLET | Freq: Once | ORAL | Status: AC
  Administered 2017-09-20: 5 mg via ORAL
  Filled 2017-09-20: qty 1

## 2017-09-20 NOTE — Progress Notes (Signed)
  Speech Language Pathology Treatment: Dysphagia  Patient Details Name: Caleb Singh MRN: [id number] DOB: 1956/03/18 Today's Date: 09/20/2017 Time: [id number] SLP Time Calculation (min) (ACUTE ONLY): 12 min  Assessment / Plan / Recommendation Clinical Impression  Pt required moderate verbal and visual cueing to recall and implement swallow techniques. SLP posted strategies and pt used to implement precautions. Noted several throat clears that did not appear volitional. Reiterated clinical reasoning for multiple swallows after pt stated "it's already gone". He is masticating Dys 2 well per pt report. Continue Dys 2, thin with strategies and hopeful texture upgrade when appropriate.    HPI HPI: Pt is a 62 y.o.malewith PMH of PE, tobacco abuse, and recent hospitalization for small bowel perforation from 07/23/2017-08/31/2017 complicated by multisystem failure. Discharged to a SNF 08/31/17, however returned 09/10/17 with DOE and chest tightness, with subsequent respiratory failure from presumed massive PE, with PEA arrest x2 (1/22). Pt intubated 1/22 to 1/24. Failed post-extubation screen. CXR (1/24) revealing interval increase in bibasilar airspace disease and associated effusions. While this may represent atelectasis, infection is not excluded. Findings are worse on the right.       SLP Plan  Continue with current plan of care       Recommendations  Diet recommendations: Dysphagia 2 (fine chop);Thin liquid Liquids provided via: Straw Medication Administration: Crushed with puree Supervision: Patient able to self feed;Intermittent supervision to cue for compensatory strategies Compensations: Slow rate;Small sips/bites;Clear throat intermittently;Multiple dry swallows after each bite/sip Postural Changes and/or Swallow Maneuvers: Seated upright 90 degrees                Oral Care Recommendations: Oral care BID Follow up Recommendations: Skilled Nursing facility SLP Visit Diagnosis:  Dysphagia, oropharyngeal phase (R13.12) Plan: Continue with current plan of care       GO                Royce MacadamiaLitaker, Ryllie Nieland Willis 09/20/2017, 10:05 AM   Breck CoonsLisa Willis Lonell FaceLitaker M.Ed [employer] Pager [phone]

## 2017-09-20 NOTE — Procedures (Signed)
Assessment/Plan:62 year old male withh/oacute kidney injury requiring dialysis in December 2018.Now presents withAKI afterPEA arrest andprolonged hypotension   1.AKI, nonoliguric- Creatinine was noted to be 1.1 in September 2017. After Francee NodalAKIin December it appears his creatinine nadir was around 2. 2. Hypertension/volume-Will stop IVF and see if he can tol PO 3.Hypokalemia, supp  Subjective: Interval History: liquid stool/ostomy  Objective: Vital signs in last 24 hours: Temp:  [97.5 F (36.4 C)-98.6 F (37 C)] 98.3 F (36.8 C) (02/01 0738) Pulse Rate:  [84-94] 84 (02/01 0738) Resp:  [20-29] 21 (02/01 0738) BP: (127-156)/(77-97) 135/84 (02/01 0738) SpO2:  [98 %-100 %] 100 % (02/01 0738) Weight:  [70.2 kg (154 lb 12.8 oz)] 70.2 kg (154 lb 12.8 oz) (02/01 0343) Weight change: 0.117 kg (4.1 oz)  Intake/Output from previous day: 01/31 0701 - 02/01 0700 In: 3248.5 [P.O.:720; I.V.:2528.5] Out: 5400 [Urine:2300; Stool:3100] Intake/Output this shift: Total I/O In: -  Out: 400 [Urine:400]  unchanged exam  Lab Results: Recent Labs    09/19/17 0527 09/20/17 0405  WBC 14.0* 15.9*  HGB 7.9* 7.6*  HCT 24.0* 23.2*  PLT 324 386   BMET:  Recent Labs    09/19/17 0527 09/20/17 0405  NA 136 138  K 3.5 3.5  CL 99* 98*  CO2 26 29  GLUCOSE 96 94  BUN 43* 34*  CREATININE 3.17* 2.71*  CALCIUM 7.7* 7.8*   No results for input(s): PTH in the last 72 hours. Iron Studies: No results for input(s): IRON, TIBC, TRANSFERRIN, FERRITIN in the last 72 hours. Studies/Results: No results found.  Scheduled: . collagenase   Topical Daily  . darbepoetin (ARANESP) injection - NON-DIALYSIS  100 mcg Subcutaneous Q Fri-1800  . diphenoxylate-atropine  2 tablet Oral QID  . feeding supplement (NEPRO CARB STEADY)  237 mL Oral TID BM  . feeding supplement (PRO-STAT SUGAR FREE 64)  30 mL Oral BID  . insulin aspart  0-9 Units Subcutaneous TID WC  . loperamide  4 mg Oral QID  . mouth  rinse  15 mL Mouth Rinse BID  . psyllium  1 packet Oral TID  . warfarin  5 mg Oral ONCE-1800  . Warfarin - Pharmacist Dosing Inpatient   Does not apply q1800      LOS: 10 days   Lauris Poaglvin C Tremel Setters 09/20/2017,9:36 AM

## 2017-09-20 NOTE — Care Management Note (Signed)
Case Management Note  Patient Details  Name: Caleb Singh MRN: [id number] Date of Birth: [birth year]  Subjective/Objective: Pt presented for worsening SOB- Intubated/ Extubated 09-12-17. [facility] [place]. CSW will continue to follow.                    Action/Plan: CM following for additional needs. Possible plan for d/c 09-21-17.   Expected Discharge Date:                  Expected Discharge Plan:  [facility]  In-House Referral:  Clinical Social Work  Discharge planning Services  CM Consult  Post Acute Care Choice:  NA Choice offered to:  NA  DME Arranged:  N/A DME Agency:  NA  HH Arranged:  NA HH Agency:  NA  Status of Service:  Completed, signed off  If discussed at [employer] Length of Stay Meetings, dates discussed:    Additional Comments:  Gala LewandowskyGraves-Bigelow, Shuntay Everetts Kaye, RN 09/20/2017, 3:30 PM

## 2017-09-20 NOTE — Progress Notes (Signed)
PROGRESS NOTE    Caleb Singh  ZOX:[id number] DOB: [birth year] DOA: 09/10/2017 PCP: Patient, No Pcp Per     Brief Narrative:  Caleb Singh is a 62 year old Caucasian male with past medical history of smoking, history of PE, who suffered from a perforated small bowel in December for which he underwent small bowel resection complicated by multisystem failure requiring CRRT.  Recovery was also complicated by E. coli bacteremia, respiratory failure, brief arrest and intra-abdominal abscess which was drained percutaneously.  He was discharged on 1/12 to rehab.  On 1/22 started having worsening shortness of breath.  He was undergoing workup to rule out PE and while he was in VQ scan he had worsening dyspnea with a rapid decline and then had PEA arrest.  He received systemic tPA due to strong suspicion for pulmonary embolism.  Transthoracic echocardiogram was suspicious for right atrial and IVC blood clot.  He was admitted to the intensive care unit. Now he has stabilized and then transferred to the floor. Nephrology has been following for acute renal failure.   Assessment & Plan:   Principal Problem:   Pulmonary embolism (HCC) Active Problems:   AKI (acute kidney injury) (HCC)   Acute respiratory failure (HCC)   Intestinal perforation s/p SB resection/ileostomy 07/24/2017 & 07/25/2017   Ileostomy in place [facility])   Lactic acidosis   Malnutrition of moderate degree   Cardiac arrest (HCC)   Intramural hematoma of artery of right upper extremity  Acute hypoxic respiratory failure  Most likely secondary to massive PE.  Patient was intubated in the intensive care unit.  Extubated on 1/24. He seems to be stable from a respiratory standpoint without any new symptoms. Stable on room air today and no respiratory distress or conversational dyspnea.   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 Nephrology is following and patient has been started on IV fluids.  Renal function worsened most likely due to  high ostomy output.  Patient has required CRRT during previous hospitalization. Cr slowly improving.    High ostomy output/Recent perforated small bowel 12/24 with intra-abd abscess Patient noted to have significant output from his ostomy. CT of abdomen pelvis does not show anything concerning.  No intra-abdominal fluid collection noted.  Reason for high output from his ostomy not clear.  Could be due to short gut syndrome.  Dr. Rito Ehrlich discussed with general surgery.  They recommended fiber and Imodium. Hold off on feeding supplements and continue to watch output.    Suspect massive pulmonary embolism/RA/IVC thrombus/acute lower extremity DVT F/u echo did no longer visualized RA thrombus.  DVT noted bilateral lower extremities. S/p systemic thrombolytics 1/22. Holding home norvasc. Heparin gtt per pharmacy. Will initiate coumadin for disposition. Due to his CKD as well as previous need for CRRT, coumadin may be best option for him. Patient states he was on coumadin years ago when he had a previous PE   Shock - cardiogenic/PEA arrest Resolved. Has been off of pressors.   Right arm swelling He has had swelling of his right upper extremity for the past many days.  He also has bruising in the right arm.  He has good radial pulses.  [place] negative for DVT but US revealed complex collection of fluid hematoma vs abscess.  As patient has not had any fevers or worsening leukocytosis, this is most likely a hematoma. Size remains stable and is softer on examination. Continue to elevate   Elevated troponin/history of bradyarrhythmia Patient seen by cardiology for elevated troponin.  Thought to be  secondary to all of the other underlying issues.  No evidence for ischemia.  Previous admission he did require temporary pacing wire.  No bradyarrhythmias noted during this hospitalization yet.  Transaminitis Most likely due to shock liver. Improved.   Normocytic anemia with likely component of acute blood loss   Stable  Recent hospitalization for intra-abdominal abscess and E. coli bacteremia  Blood cultures have been negative however patient's pro calcitonin was noted to be significantly elevated at 50.13.  Repeat levels show improvement to 6.87.  Was on meropenem empirically.  Antibiotics discontinued 1/27.  CT scan did not show any intra-abdominal infectious etiology.  Blood cultures have been negative.  He remains afebrile.  WBC has been elevated but stable  Hyperglycemia  SSI. CBG's stable.   Acute metabolic encephalopathy Back to baseline  Dysphagia/Nutrition Diet has been advanced.  Tube feedings were discontinued on 1/28.  Malnutrition of moderate degree Encourage oral intake.   DVT prophylaxis: Heparin drip and coumadin  Code Status: Partial code Family Communication: No family at bedside Disposition Plan: Skilled nursing facility when stable, possibly 2/2   Consultants:   PCCM  Cardiology  Nephrology   Antimicrobials:  Anti-infectives (From admission, onward)   Start     Dose/Rate Route Frequency Ordered Stop   09/10/17 1500  meropenem (MERREM) 500 mg in sodium chloride 0.9 % 50 mL IVPB     500 mg 100 mL/hr over 30 Minutes Intravenous Every 12 hours 09/10/17 1434 09/15/17 0425       Subjective: No new issues. Ostomy leaked overnight and staff working on getting correct bag to place on his ostomy. Had a lot of output overnight, 3L last 24 hours.   Objective: Vitals:   09/20/17 0604 09/20/17 0738 09/20/17 1000 09/20/17 1135  BP: 127/79 135/84 125/83 128/78  Pulse: 86 84 90 88  Resp: 20 (!) 21 20 20   Temp:  98.3 F (36.8 C) 97.7 F (36.5 C) 98.2 F (36.8 C)  TempSrc:  Oral Oral Oral  SpO2: 100% 100% 99% 100%  Weight:      Height:        Intake/Output Summary (Last 24 hours) at 09/20/2017 1300 Last data filed at 09/20/2017 1236 Gross per 24 hour  Intake 2206.58 ml  Output 5450 ml  Net -3243.42 ml   Filed Weights   09/18/17 0440 09/19/17 0401  09/20/17 0343  Weight: 71.2 kg (156 lb 14.4 oz) 70.1 kg (154 lb 8.7 oz) 70.2 kg (154 lb 12.8 oz)    Examination:  General exam: Appears calm and comfortable  Respiratory system: Clear to auscultation. Respiratory effort normal. Cardiovascular system: S1 & S2 heard, RRR. No JVD, murmurs, rubs, gallops or clicks. No pedal edema. Gastrointestinal system: Abdomen is nondistended, soft and nontender. No organomegaly or masses felt. Normal bowel sounds heard.  Ostomy in place Central nervous system: Alert and oriented. No focal neurological deficits. Extremities: + Right upper extremity with swelling and bruising, similar in size from yesterday's exam Skin: No rashes, lesions or ulcers Psychiatry: Judgement and insight appear normal. Mood & affect appropriate.   Data Reviewed: I have personally reviewed following labs and imaging studies  CBC: Recent Labs  Lab 09/16/17 0651 09/17/17 0438 09/18/17 0412 09/19/17 0527 09/20/17 0405  WBC 12.3* 14.1* 13.7* 14.0* 15.9*  HGB 8.3* 7.7* 7.7* 7.9* 7.6*  HCT 26.0* 24.1* 23.7* 24.0* 23.2*  MCV 91.9 89.9 89.1 88.6 90.3  PLT 220 230 277 324 386   Basic Metabolic Panel: Recent Labs  Lab 09/13/17 1905  09/14/17 0459  09/16/17 4098 09/17/17 0438 09/18/17 0412 09/19/17 0527 09/20/17 0405  NA  --  139   < > 142 137 132* 136 138  K  --  3.2*   < > 3.3* 3.7 4.0 3.5 3.5  CL  --  97*   < > 104 102 101 99* 98*  CO2  --  27   < > 24 23 19* 26 29  GLUCOSE  --  119*   < > 116* 92 91 96 94  BUN  --  55*   < > 65* 60* 54* 43* 34*  CREATININE  --  4.99*   < > 5.26* 4.67* 3.91* 3.17* 2.71*  CALCIUM  --  7.0*   < > 7.9* 7.8* 7.7* 7.7* 7.8*  MG 2.0 1.9  --   --   --   --   --   --   PHOS 6.6* 5.9*  6.0*   < > 5.0* 4.4 4.8* 4.8* 4.1   < > = values in this interval not displayed.   GFR: Estimated Creatinine Clearance: 27.7 mL/min (A) (by C-G formula based on SCr of 2.71 mg/dL (H)). Liver Function Tests: Recent Labs  Lab 09/16/17 0651 09/17/17 0438  09/18/17 0412 09/19/17 0527 09/20/17 0405  AST  --  35  --  22  --   ALT  --  236*  --  107*  --   ALKPHOS  --  128*  --  122  --   BILITOT  --  1.0  --  0.7  --   PROT  --  6.0*  --  5.5*  --   ALBUMIN 1.8* 1.8*  1.8* 1.8* 1.7*  1.7* 1.8*   No results for input(s): LIPASE, AMYLASE in the last 168 hours. No results for input(s): AMMONIA in the last 168 hours. Coagulation Profile: Recent Labs  Lab 09/19/17 0900 09/20/17 0405  INR 1.44 1.40   Cardiac Enzymes: No results for input(s): CKTOTAL, CKMB, CKMBINDEX, TROPONINI in the last 168 hours. BNP (last 3 results) No results for input(s): PROBNP in the last 8760 hours. HbA1C: No results for input(s): HGBA1C in the last 72 hours. CBG: Recent Labs  Lab 09/19/17 1135 09/19/17 1633 09/19/17 2059 09/20/17 0736 09/20/17 1132  GLUCAP 101* 82 92 106* 113*   Lipid Profile: No results for input(s): CHOL, HDL, LDLCALC, TRIG, CHOLHDL, LDLDIRECT in the last 72 hours. Thyroid Function Tests: No results for input(s): TSH, T4TOTAL, FREET4, T3FREE, THYROIDAB in the last 72 hours. Anemia Panel: No results for input(s): VITAMINB12, FOLATE, FERRITIN, TIBC, IRON, RETICCTPCT in the last 72 hours. Sepsis Labs: Recent Labs  Lab 09/15/17 0744 09/16/17 0651  PROCALCITON 15.48 6.87    Recent Results (from the past 240 hour(s))  Urine culture     Status: None   Collection Time: 09/12/17 12:48 AM  Result Value Ref Range Status   Specimen Description URINE, RANDOM  Final   Special Requests NONE  Final   Culture NO GROWTH  Final   Report Status 09/13/2017 FINAL  Final  MRSA PCR Screening     Status: None   Collection Time: 09/12/17  3:29 PM  Result Value Ref Range Status   MRSA by PCR NEGATIVE NEGATIVE Final    Comment:        The GeneXpert MRSA Assay (FDA approved for NASAL specimens only), is one component of a comprehensive MRSA colonization surveillance program. It is not intended to diagnose MRSA infection nor to guide  or  monitor treatment for MRSA infections.        Radiology Studies: No results found.    Scheduled Meds: . collagenase   Topical Daily  . darbepoetin (ARANESP) injection - NON-DIALYSIS  100 mcg Subcutaneous Q Fri-1800  . diphenoxylate-atropine  2 tablet Oral QID  . feeding supplement (NEPRO CARB STEADY)  237 mL Oral TID BM  . feeding supplement (PRO-STAT SUGAR FREE 64)  30 mL Oral BID  . insulin aspart  0-9 Units Subcutaneous TID WC  . loperamide  4 mg Oral QID  . mouth rinse  15 mL Mouth Rinse BID  . potassium chloride SA  20 mEq Oral BID  . psyllium  1 packet Oral TID  . warfarin  5 mg Oral ONCE-1800  . Warfarin - Pharmacist Dosing Inpatient   Does not apply q1800   Continuous Infusions: . heparin 2,250 Units/hr (09/20/17 0848)     LOS: 10 days    Time spent: 30 minutes   Noralee Stain, DO [facility] [email or web address] Password [email or web address] 09/20/2017, 1:00 PM

## 2017-09-20 NOTE — Progress Notes (Signed)
ANTICOAGULATION CONSULT NOTE - Follow Up Consult  Pharmacy Consult:  Heparin/Coumadin (Overlap D#2/5) Indication: pulmonary embolus and DVT  No Known Allergies  Patient Measurements: Height: 5\' 10"  (177.8 cm) Weight: 154 lb 12.8 oz (70.2 kg) IBW/kg (Calculated) : 73 Heparin Dosing Weight: 71 kg  Vital Signs: Temp: 98.3 F (36.8 C) (02/01 0738) Temp Source: Oral (02/01 0738) BP: 135/84 (02/01 0738) Pulse Rate: 84 (02/01 0738)  Labs: Recent Labs    09/18/17 0412 09/19/17 0527 09/19/17 0900 09/20/17 0405  HGB 7.7* 7.9*  --  7.6*  HCT 23.7* 24.0*  --  23.2*  PLT 277 324  --  386  LABPROT  --   --  17.5* 17.0*  INR  --   --  1.44 1.40  HEPARINUNFRC 0.35 0.33  --  0.29*  CREATININE 3.91* 3.17*  --  2.71*    Estimated Creatinine Clearance: 27.7 mL/min (A) (by C-G formula based on SCr of 2.71 mg/dL (H)).    Assessment: [name] continues on IV heparin bridge to Coumadin for PE, bilateral DVT and RA/IVC thrombus.  He is s/p PEA arrest x2 and received TNKase on 09/10/17.  Heparin level is slightly below goal and INR is sub-therapeutic as expected.  No bleeding reported.   Goal of Therapy:  Heparin level 0.3-0.7 units/ml Monitor platelets by anticoagulation protocol: Yes    Plan:  Increase heparin gtt to 2250 units/hr Daily heparin level and CBC  Repeat Coumadin 5mg  PO today Daily PT / INR   Tayven Renteria D. Laney Potashang, PharmD, BCPS Pager:  [phone] 09/20/2017, 8:13 AM

## 2017-09-21 ENCOUNTER — Encounter (HOSPITAL_COMMUNITY): Payer: Self-pay | Admitting: Emergency Medicine

## 2017-09-21 ENCOUNTER — Observation Stay (HOSPITAL_COMMUNITY): Payer: Medicaid Other

## 2017-09-21 ENCOUNTER — Observation Stay (HOSPITAL_COMMUNITY)
Admission: EM | Admit: 2017-09-21 | Discharge: 2017-09-22 | Disposition: A | Discharge status: Skilled Nursing Facility | Payer: Medicaid Other | Attending: Internal Medicine | Admitting: Internal Medicine

## 2017-09-21 ENCOUNTER — Emergency Department (HOSPITAL_COMMUNITY): Payer: Medicaid Other

## 2017-09-21 DIAGNOSIS — I2699 Other pulmonary embolism without acute cor pulmonale: Principal | ICD-10-CM | POA: Diagnosis present

## 2017-09-21 DIAGNOSIS — J9811 Atelectasis: Secondary | ICD-10-CM | POA: Insufficient documentation

## 2017-09-21 DIAGNOSIS — Z79899 Other long term (current) drug therapy: Secondary | ICD-10-CM | POA: Insufficient documentation

## 2017-09-21 DIAGNOSIS — N184 Chronic kidney disease, stage 4 (severe): Secondary | ICD-10-CM | POA: Insufficient documentation

## 2017-09-21 DIAGNOSIS — Z7901 Long term (current) use of anticoagulants: Secondary | ICD-10-CM | POA: Insufficient documentation

## 2017-09-21 DIAGNOSIS — Z9049 Acquired absence of other specified parts of digestive tract: Secondary | ICD-10-CM | POA: Insufficient documentation

## 2017-09-21 DIAGNOSIS — J90 Pleural effusion, not elsewhere classified: Secondary | ICD-10-CM | POA: Insufficient documentation

## 2017-09-21 DIAGNOSIS — R042 Hemoptysis: Secondary | ICD-10-CM | POA: Diagnosis present

## 2017-09-21 DIAGNOSIS — Z933 Colostomy status: Secondary | ICD-10-CM | POA: Insufficient documentation

## 2017-09-21 DIAGNOSIS — Z8674 Personal history of sudden cardiac arrest: Secondary | ICD-10-CM | POA: Insufficient documentation

## 2017-09-21 DIAGNOSIS — Z66 Do not resuscitate: Secondary | ICD-10-CM | POA: Insufficient documentation

## 2017-09-21 LAB — RENAL FUNCTION PANEL
ANION GAP: 10 (ref 5–15)
Albumin: 1.8 g/dL — ABNORMAL LOW (ref 3.5–5.0)
BUN: 25 mg/dL — ABNORMAL HIGH (ref 6–20)
CALCIUM: 8 mg/dL — AB (ref 8.9–10.3)
CO2: 30 mmol/L (ref 22–32)
Chloride: 98 mmol/L — ABNORMAL LOW (ref 101–111)
Creatinine, Ser: 2.37 mg/dL — ABNORMAL HIGH (ref 0.61–1.24)
GFR, EST AFRICAN AMERICAN: 32 mL/min — AB (ref >60)
GFR, EST NON AFRICAN AMERICAN: 28 mL/min — AB (ref >60)
Glucose, Bld: 94 mg/dL (ref 65–99)
PHOSPHORUS: 4.2 mg/dL (ref 2.5–4.6)
Potassium: 3.5 mmol/L (ref 3.5–5.1)
SODIUM: 138 mmol/L (ref 135–145)

## 2017-09-21 LAB — CBC
HCT: 23.2 % — ABNORMAL LOW (ref 39.0–52.0)
HEMOGLOBIN: 7.5 g/dL — AB (ref 13.0–17.0)
MCH: 29.3 pg (ref 26.0–34.0)
MCHC: 32.3 g/dL (ref 30.0–36.0)
MCV: 90.6 fL (ref 78.0–100.0)
Platelets: 467 10*3/uL — ABNORMAL HIGH (ref 150–400)
RBC: 2.56 MIL/uL — AB (ref 4.22–5.81)
RDW: 15.7 % — ABNORMAL HIGH (ref 11.5–15.5)
WBC: 15.7 10*3/uL — AB (ref 4.0–10.5)

## 2017-09-21 LAB — BASIC METABOLIC PANEL
ANION GAP: 12 (ref 5–15)
Anion gap: 10 (ref 5–15)
BUN: 26 mg/dL — AB (ref 6–20)
BUN: 24 mg/dL — ABNORMAL HIGH (ref 6–20)
CHLORIDE: 93 mmol/L — AB (ref 101–111)
CO2: 30 mmol/L (ref 22–32)
CO2: 29 mmol/L (ref 22–32)
CREATININE: 2.44 mg/dL — AB (ref 0.61–1.24)
Calcium: 8 mg/dL — ABNORMAL LOW (ref 8.9–10.3)
Calcium: 7.8 mg/dL — ABNORMAL LOW (ref 8.9–10.3)
Chloride: 98 mmol/L — ABNORMAL LOW (ref 101–111)
Creatinine, Ser: 2.31 mg/dL — ABNORMAL HIGH (ref 0.61–1.24)
GFR calc Af Amer: 31 mL/min — ABNORMAL LOW (ref >60)
GFR calc non Af Amer: 28 mL/min — ABNORMAL LOW (ref >60)
GFR, EST AFRICAN AMERICAN: 33 mL/min — AB (ref >60)
GFR, EST NON AFRICAN AMERICAN: 27 mL/min — AB (ref >60)
GLUCOSE: 94 mg/dL (ref 65–99)
Glucose, Bld: 94 mg/dL (ref 65–99)
POTASSIUM: 3.4 mmol/L — AB (ref 3.5–5.1)
POTASSIUM: 3.6 mmol/L (ref 3.5–5.1)
SODIUM: 138 mmol/L (ref 135–145)
Sodium: 134 mmol/L — ABNORMAL LOW (ref 135–145)

## 2017-09-21 LAB — CBC WITH DIFFERENTIAL/PLATELET
Basophils Absolute: 0 10*3/uL (ref 0.0–0.1)
Basophils Relative: 0 %
EOS ABS: 0 10*3/uL (ref 0.0–0.7)
EOS PCT: 0 %
HCT: 24.2 % — ABNORMAL LOW (ref 39.0–52.0)
Hemoglobin: 7.8 g/dL — ABNORMAL LOW (ref 13.0–17.0)
LYMPHS ABS: 3.3 10*3/uL (ref 0.7–4.0)
LYMPHS PCT: 22 %
MCH: 29.4 pg (ref 26.0–34.0)
MCHC: 32.2 g/dL (ref 30.0–36.0)
MCV: 91.3 fL (ref 78.0–100.0)
MONO ABS: 0.9 10*3/uL (ref 0.1–1.0)
Monocytes Relative: 6 %
Neutro Abs: 10.7 10*3/uL — ABNORMAL HIGH (ref 1.7–7.7)
Neutrophils Relative %: 72 %
PLATELETS: 519 10*3/uL — AB (ref 150–400)
RBC: 2.65 MIL/uL — ABNORMAL LOW (ref 4.22–5.81)
RDW: 15.8 % — ABNORMAL HIGH (ref 11.5–15.5)
WBC: 15 10*3/uL — ABNORMAL HIGH (ref 4.0–10.5)

## 2017-09-21 LAB — GLUCOSE, CAPILLARY
GLUCOSE-CAPILLARY: 90 mg/dL (ref 65–99)
Glucose-Capillary: 99 mg/dL (ref 65–99)
Glucose-Capillary: 94 mg/dL (ref 65–99)

## 2017-09-21 LAB — I-STAT TROPONIN, ED: Troponin i, poc: 0.02 ng/mL (ref 0.00–0.08)

## 2017-09-21 LAB — BRAIN NATRIURETIC PEPTIDE: B Natriuretic Peptide: 362.4 pg/mL — ABNORMAL HIGH (ref 0.0–100.0)

## 2017-09-21 LAB — PROTIME-INR
INR: 1.6
INR: 1.48
PROTHROMBIN TIME: 18.9 s — AB (ref 11.4–15.2)
PROTHROMBIN TIME: 17.8 s — AB (ref 11.4–15.2)

## 2017-09-21 LAB — HEPARIN LEVEL (UNFRACTIONATED): Heparin Unfractionated: 0.38 IU/mL (ref 0.30–0.70)

## 2017-09-21 MED ORDER — ACETAMINOPHEN 650 MG RE SUPP: 650.0000 mg | Freq: Four times a day (QID) | RECTAL | Status: DC | PRN

## 2017-09-21 MED ORDER — ENOXAPARIN SODIUM 80 MG/0.8ML ~~LOC~~ SOLN
80.0000 mg | SUBCUTANEOUS | Status: DC
  Administered 2017-09-22: 80 mg via SUBCUTANEOUS
  Filled 2017-09-21: qty 0.8

## 2017-09-21 MED ORDER — WARFARIN SODIUM 5 MG PO TABS: 5.0000 mg | ORAL_TABLET | Freq: Once | ORAL | 0 refills | Status: AC

## 2017-09-21 MED ORDER — LOPERAMIDE HCL 2 MG PO CAPS
4.0000 mg | ORAL_CAPSULE | Freq: Four times a day (QID) | ORAL | Status: DC
  Administered 2017-09-21 – 2017-09-22 (×3): 4 mg via ORAL
  Filled 2017-09-21 (×3): qty 2

## 2017-09-21 MED ORDER — ALBUTEROL SULFATE (2.5 MG/3ML) 0.083% IN NEBU: 3.0000 mL | INHALATION_SOLUTION | Freq: Four times a day (QID) | RESPIRATORY_TRACT | Status: DC | PRN

## 2017-09-21 MED ORDER — ACETAMINOPHEN 325 MG PO TABS: 650.0000 mg | ORAL_TABLET | Freq: Four times a day (QID) | ORAL | Status: DC | PRN

## 2017-09-21 MED ORDER — DIPHENOXYLATE-ATROPINE 2.5-0.025 MG PO TABS
2.0000 | ORAL_TABLET | Freq: Four times a day (QID) | ORAL | Status: DC
Start: 2017-09-21 — End: 2017-09-22
  Administered 2017-09-21 – 2017-09-22 (×3): 2 via ORAL
  Filled 2017-09-21 (×3): qty 2

## 2017-09-21 MED ORDER — ENOXAPARIN SODIUM 80 MG/0.8ML ~~LOC~~ SOLN
80.0000 mg | SUBCUTANEOUS | Status: DC
  Administered 2017-09-21: 80 mg via SUBCUTANEOUS
  Filled 2017-09-21: qty 0.8

## 2017-09-21 MED ORDER — FLUTICASONE FUROATE-VILANTEROL 100-25 MCG/INH IN AEPB
1.0000 | INHALATION_SPRAY | Freq: Every day | RESPIRATORY_TRACT | Status: DC
  Administered 2017-09-22: 1 via RESPIRATORY_TRACT
  Filled 2017-09-21: qty 28

## 2017-09-21 MED ORDER — PSYLLIUM 95 % PO PACK: 1.0000 | PACK | Freq: Every day | ORAL | 0 refills | Status: AC

## 2017-09-21 MED ORDER — WARFARIN SODIUM 5 MG PO TABS: 5.0000 mg | ORAL_TABLET | Freq: Once | ORAL | Status: DC

## 2017-09-21 MED ORDER — LOPERAMIDE HCL 2 MG PO CAPS: 4.0000 mg | ORAL_CAPSULE | Freq: Four times a day (QID) | ORAL | 0 refills | Status: AC

## 2017-09-21 MED ORDER — ENOXAPARIN SODIUM 80 MG/0.8ML ~~LOC~~ SOLN: 80.0000 mg | SUBCUTANEOUS | 0 refills | Status: AC

## 2017-09-21 MED ORDER — WARFARIN - PHYSICIAN DOSING INPATIENT: Freq: Every day | Status: DC

## 2017-09-21 MED ORDER — POTASSIUM CHLORIDE 20 MEQ PO PACK
20.0000 meq | PACK | Freq: Once | ORAL | Status: AC
  Administered 2017-09-21: 20 meq via ORAL
  Filled 2017-09-21: qty 1

## 2017-09-21 MED ORDER — PSYLLIUM 95 % PO PACK
1.0000 | PACK | Freq: Every day | ORAL | Status: DC
  Administered 2017-09-22: 1 via ORAL
  Filled 2017-09-21: qty 1

## 2017-09-21 MED ORDER — WARFARIN SODIUM 5 MG PO TABS
5.0000 mg | ORAL_TABLET | Freq: Once | ORAL | Status: DC
  Administered 2017-09-21: 5 mg via ORAL
  Filled 2017-09-21: qty 1

## 2017-09-21 MED ORDER — DIPHENOXYLATE-ATROPINE 2.5-0.025 MG PO TABS: 2.0000 | ORAL_TABLET | Freq: Four times a day (QID) | ORAL | 0 refills | Status: AC

## 2017-09-21 MED ORDER — SENNOSIDES-DOCUSATE SODIUM 8.6-50 MG PO TABS: 1.0000 | ORAL_TABLET | Freq: Every evening | ORAL | Status: DC | PRN

## 2017-09-21 NOTE — ED Provider Notes (Signed)
[facility] EMERGENCY DEPARTMENT Provider Note   CSN: [id number] Arrival date & time: 09/21/17  1813     History   Chief Complaint Chief Complaint  Patient presents with  . Hemoptysis    HPI  Caleb Singh is a 62 y.o. male.  Hospitalized for PE with substantial complications, discharged today.  En route to rehab, patient began having hemoptysis.    Cough  This is a new problem. The current episode started less than 1 hour ago. The problem has not changed since onset.The cough is productive of bloody sputum. There has been no fever. Pertinent negatives include no chest pain, no chills, no ear pain, no rhinorrhea, no sore throat and no shortness of breath. He has tried nothing for the symptoms. Past medical history comments: PE on anticoagulation.    Past Medical History:  Diagnosis Date  . Bowel obstruction (HCC)   . Pneumothorax     Patient Active Problem List   Diagnosis Date Noted  . Cardiac arrest (HCC) 09/19/2017  . Pulmonary embolism (HCC) 09/19/2017  . 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 Malnutrition of moderate degree 09/14/2017  . Lactic acidosis 09/10/2017  . Protein-calorie malnutrition, severe 08/28/2017  . Abdominopelvic abscess (HCC)   . Postoperative intra-abdominal abscess   . DNR (do not resuscitate) discussion   . Palliative care by specialist   . Weakness generalized   . Septic shock (HCC)   . Pressure injury of skin 08/07/2017  . Tachypnea   . Intestinal perforation s/p SB resection/ileostomy 07/24/2017 & 07/25/2017 07/27/2017  . Ileostomy in place [facility]) 07/27/2017  . Acute respiratory failure (HCC)   . AKI (acute kidney injury) (HCC)   . Free intraperitoneal air 07/23/2017    Past Surgical History:  Procedure Laterality Date  . COLOSTOMY    . I&D EXTREMITY Right 04/17/2016   Procedure: IRRIGATION AND DEBRIDEMENT RIGHT HAND;  Surgeon: Dominica Severin, MD;  Location: [facility] OR;  Service:  Orthopedics;  Laterality: Right;  . I&D EXTREMITY Right 04/19/2016   Procedure: REPEAT I&D RIGHT HAND;  Surgeon: Dominica Severin, MD;  Location: [facility] OR;  Service: Orthopedics;  Laterality: Right;  . LAPAROTOMY N/A 07/23/2017   Procedure: EXPLORATORY LAPAROTOMY, SMALL BOWEL RESECTION;  Surgeon: Gaynelle Adu, MD;  Location: [facility] ORS;  Service: General;  Laterality: N/A;  . LAPAROTOMY N/A 07/25/2017   Procedure: EXPLORATORY LAPAROSCOPY WITH ILEOCECTOMY, END ILEOSTOMY;  Surgeon: Almond Lint, MD;  Location: [facility] ORS;  Service: General;  Laterality: N/A;  PATIENT ABDOMINAL WOUND LEFT OPEN AND PACKED WITH BULKY DRESSING  . LAPAROTOMY N/A 07/31/2017   Procedure: EXPLORATORY LAPAROTOMY drainage of abdominal abcess;  Surgeon: Glenna Fellows, MD;  Location: [facility] ORS;  Service: General;  Laterality: N/A;  . LEFT HEART CATH AND CORONARY ANGIOGRAPHY N/A 08/13/2017   Procedure: LEFT HEART CATH AND CORONARY ANGIOGRAPHY;  Surgeon: Dolores Patty, MD;  Location: [facility] INVASIVE CV LAB;  Service: Cardiovascular;  Laterality: N/A;       Home Medications    Prior to Admission medications   Medication Sig Start Date End Date Taking? Authorizing Provider  albuterol (PROVENTIL HFA;VENTOLIN HFA) 108 (90 Base) MCG/ACT inhaler Inhale 2 puffs into the lungs every 6 (six) hours as needed for wheezing or shortness of breath.    [provider]  diphenoxylate-atropine (LOMOTIL) 2.5-0.025 MG tablet Take 2 tablets by mouth 4 (four) times daily. 09/21/17   Noralee Stain, DO  enoxaparin (LOVENOX) 80 MG/0.8ML injection Inject 0.8 mLs (80 mg total) into  the skin daily for 7 days. 09/22/17 09/29/17  Noralee Stainhoi, Jennifer, DO  fluticasone furoate-vilanterol (BREO ELLIPTA) 100-25 MCG/INH AEPB Inhale 1 puff into the lungs daily.    [provider]  loperamide (IMODIUM) 2 MG capsule Take 2 capsules (4 mg total) by mouth 4 (four) times daily. 09/21/17   Noralee Stainhoi, Jennifer, DO  psyllium (HYDROCIL/METAMUCIL) 95 % PACK Take 1 packet by  mouth daily. 09/21/17   Noralee Stainhoi, Jennifer, DO  warfarin (COUMADIN) 5 MG tablet Take 1 tablet (5 mg total) by mouth one time only at 6 PM. 09/21/17   Noralee Stainhoi, Jennifer, DO    Family History History reviewed. No pertinent family history.  Social History Social History   Tobacco Use  . Smoking status: Former Games developermoker  . Smokeless tobacco: Never Used  Substance Use Topics  . Alcohol use: Yes  . Drug use: No     Allergies   Patient has no known allergies.   Review of Systems Review of Systems  Constitutional: Negative for chills and fever.  HENT: Negative for ear pain, rhinorrhea and sore throat.   Eyes: Negative for pain and visual disturbance.  Respiratory: Positive for cough. Negative for shortness of breath.   Cardiovascular: Negative for chest pain and palpitations.  Gastrointestinal: Negative for abdominal pain and vomiting.  Genitourinary: Negative for dysuria and hematuria.  Musculoskeletal: Negative for arthralgias and back pain.  Skin: Negative for color change and rash.  Neurological: Negative for seizures and syncope.  All other systems reviewed and are negative.    Physical Exam Updated Vital Signs BP 111/74 (BP Location: Left Arm)   Pulse 89   Temp 98.9 F (37.2 C) (Oral)   Resp 19   SpO2 100%   Physical Exam  Constitutional: He is oriented to person, place, and time. He appears well-developed and well-nourished.  HENT:  Head: Normocephalic and atraumatic.  Eyes: Conjunctivae and EOM are normal. Pupils are equal, round, and reactive to light.  Neck: Neck supple.  Cardiovascular: Normal rate and regular rhythm.  Pulmonary/Chest: Effort normal and breath sounds normal. No respiratory distress.  Abdominal: Soft. There is no tenderness.  Musculoskeletal: He exhibits edema (to right arm).  Neurological: He is alert and oriented to person, place, and time.  Skin: Skin is warm and dry.  Psychiatric: He has a normal mood and affect.  Nursing note and vitals  reviewed.    ED Treatments / Results  Labs (all labs ordered are listed, but only abnormal results are displayed) Labs Reviewed - No data to display  EKG  EKG Interpretation None       Radiology No results found.  Procedures Procedures (including critical care time)  Medications Ordered in ED Medications - No data to display   Initial Impression / Assessment and Plan / ED Course  I have reviewed the triage vital signs and the nursing notes.  Pertinent labs & imaging results that were available during my care of the patient were reviewed by me and considered in my medical decision making (see chart for details).     Caleb Singh is a 62 year old male with past medical history significant for bowel obstruction, PE, cardiac arrest, postoperative abdominal abscess, kidney failure who presents for hemoptysis.  Patient is on blood thinners for PE.  Patient was discharged today to a rehab facility and in route developed hemoptysis.  Labs obtained are significant for leukocytosis or anemia similar to baseline.  Creatinine at baseline.  Chest x-ray obtained, personally reviewed by me, demonstrates atelectasis and small  pleural effusions.  CT chest without contrast obtained and read demonstrates pleural effusions.  Patient is admitted to hospitalist.  Final Clinical Impressions(s) / ED Diagnoses   Final diagnoses:  Hemoptysis    ED Discharge Orders    None       Garey Ham, MD 09/22/17 [id number]    Blane Ohara, MD 09/22/17 [phone]

## 2017-09-21 NOTE — ED Notes (Signed)
Patient transported to CT 

## 2017-09-21 NOTE — H&P (Signed)
History and Physical    Jeanette Moffatt ZOX:[id number] DOB: April 12, 1956 DOA: 09/21/2017  Referring MD/NP/PA: Jaquita Rector, MD  PCP: Patient, No Pcp Per   Outpatient Specialists: None   Patient coming from: Skilled facility  Chief Complaint: Hemoptysis  HPI: Caleb Singh is a 62 y.o. male with medical history significant of bowel perforation with recent pulmonary embolism and multiple medical problems who was discharged today from the hospital to skilled nursing facility. On their way to the facility patient has sudden onset of hemoptysis which was significant but once. EMT time around and brought patient back to the ER. He has not had anymore bleed since then. Patient is on warfarin. There is concern regarding patient's bleeding. His had multiple medical problems including acute kidney injury and acute respiratory failure with intubation during previous hospitalization. His being admitted for observation therefore.  ED Course: Patient's lab in the ER unchanged from this morning. No more hemoptysis.  Review of Systems: As per HPI otherwise 10 point review of systems negative.    Past Medical History:  Diagnosis Date  . Bowel obstruction (HCC)   . Pneumothorax     Past Surgical History:  Procedure Laterality Date  . COLOSTOMY    . I&D EXTREMITY Right 04/17/2016   Procedure: IRRIGATION AND DEBRIDEMENT RIGHT HAND;  Surgeon: Dominica Severin, MD;  [facility];  Service: Orthopedics;  Laterality: Right;  . I&D EXTREMITY Right 04/19/2016   Procedure: REPEAT I&D RIGHT HAND;  Surgeon: Dominica Severin, MD;  Location: [facility] OR;  Service: Orthopedics;  Laterality: Right;  . LAPAROTOMY N/A 07/23/2017   Procedure: EXPLORATORY LAPAROTOMY, SMALL BOWEL RESECTION;  Surgeon: Gaynelle Adu, MD;  Location: [facility] ORS;  Service: General;  Laterality: N/A;  . LAPAROTOMY N/A 07/25/2017   Procedure: EXPLORATORY LAPAROSCOPY WITH ILEOCECTOMY, END ILEOSTOMY;  Surgeon: Almond Lint, MD;  Location: [facility] ORS;  Service:  General;  Laterality: N/A;  PATIENT ABDOMINAL WOUND LEFT OPEN AND PACKED WITH BULKY DRESSING  . LAPAROTOMY N/A 07/31/2017   Procedure: EXPLORATORY LAPAROTOMY drainage of abdominal abcess;  Surgeon: Glenna Fellows, MD;  Location: [facility] ORS;  Service: General;  Laterality: N/A;  . LEFT HEART CATH AND CORONARY ANGIOGRAPHY N/A 08/13/2017   Procedure: LEFT HEART CATH AND CORONARY ANGIOGRAPHY;  Surgeon: Dolores Patty, MD;  Location: [facility] INVASIVE CV LAB;  Service: Cardiovascular;  Laterality: N/A;     reports that he has quit smoking. he has never used smokeless tobacco. He reports that he drinks alcohol. He reports that he does not use drugs.  No Known Allergies  History reviewed. No pertinent family history.   Prior to Admission medications   Medication Sig Start Date End Date Taking? Authorizing Provider  albuterol (PROVENTIL HFA;VENTOLIN HFA) 108 (90 Base) MCG/ACT inhaler Inhale 2 puffs into the lungs every 6 (six) hours as needed for wheezing or shortness of breath.    [provider]  diphenoxylate-atropine (LOMOTIL) 2.5-0.025 MG tablet Take 2 tablets by mouth 4 (four) times daily. 09/21/17   Noralee Stain, DO  enoxaparin (LOVENOX) 80 MG/0.8ML injection Inject 0.8 mLs (80 mg total) into the skin daily for 7 days. 09/22/17 09/29/17  Noralee Stain, DO  fluticasone furoate-vilanterol (BREO ELLIPTA) 100-25 MCG/INH AEPB Inhale 1 puff into the lungs daily.    [provider]  loperamide (IMODIUM) 2 MG capsule Take 2 capsules (4 mg total) by mouth 4 (four) times daily. 09/21/17   Noralee Stain, DO  psyllium (HYDROCIL/METAMUCIL) 95 % PACK Take 1 packet by mouth daily. 09/21/17   Noralee Stain,  DO  warfarin (COUMADIN) 5 MG tablet Take 1 tablet (5 mg total) by mouth one time only at 6 PM. 09/21/17   Noralee Stainhoi, Jennifer, DO    Physical Exam: Vitals:   09/21/17 2015 09/21/17 2030 09/21/17 2045 09/21/17 2100  BP: 134/76 138/83 131/77 (!) 146/88  Pulse: 87 91 86 96  Resp: (!) 23 19 (!) 26  (!) 26  Temp:      TempSrc:      SpO2: 99% 97% 99% 99%      Constitutional: NAD, calm, comfortable Vitals:   09/21/17 2015 09/21/17 2030 09/21/17 2045 09/21/17 2100  BP: 134/76 138/83 131/77 (!) 146/88  Pulse: 87 91 86 96  Resp: (!) 23 19 (!) 26 (!) 26  Temp:      TempSrc:      SpO2: 99% 97% 99% 99%   Eyes: PERRL, lids and conjunctivae normal ENMT: Mucous membranes are moist. Posterior pharynx clear of any exudate or lesions.Normal dentition.  Neck: normal, supple, no masses, no thyromegaly Respiratory: clear to auscultation bilaterally, no wheezing, no crackles. Normal respiratory effort. No accessory muscle use.  Cardiovascular: Regular rate and rhythm, no murmurs / rubs / gallops. No extremity edema. 2+ pedal pulses. No carotid bruits.  Abdomen: no tenderness, no masses palpated. No hepatosplenomegaly. Bowel sounds positive.  Musculoskeletal: no clubbing / cyanosis. No joint deformity upper and lower extremities. Good ROM, no contractures. Normal muscle tone.  Skin: no rashes, lesions, ulcers. No induration Neurologic: CN 2-12 grossly intact. Sensation intact, DTR normal. Strength 5/5 in all 4.  Psychiatric: Normal judgment and insight. Alert and oriented x 3. Normal mood.   Labs on Admission: I have personally reviewed following labs and imaging studies  CBC: Recent Labs  Lab 09/18/17 0412 09/19/17 0527 09/20/17 0405 09/21/17 0351 09/21/17 1918  WBC 13.7* 14.0* 15.9* 15.7* 15.0*  NEUTROABS  --   --   --   --  10.7*  HGB 7.7* 7.9* 7.6* 7.5* 7.8*  HCT 23.7* 24.0* 23.2* 23.2* 24.2*  MCV 89.1 88.6 90.3 90.6 91.3  PLT 277 324 386 467* 519*   Basic Metabolic Panel: Recent Labs  Lab 09/17/17 0438 09/18/17 0412 09/19/17 0527 09/20/17 0405 09/21/17 0351 09/21/17 1918  NA 137 132* 136 138 138  138 134*  K 3.7 4.0 3.5 3.5 3.4*  3.5 3.6  CL 102 101 99* 98* 98*  98* 93*  CO2 23 19* 26 29 30  30 29   GLUCOSE 92 91 96 94 94  94 94  BUN 60* 54* 43* 34* 26*  25*  24*  CREATININE 4.67* 3.91* 3.17* 2.71* 2.44*  2.37* 2.31*  CALCIUM 7.8* 7.7* 7.7* 7.8* 8.0*  8.0* 7.8*  PHOS 4.4 4.8* 4.8* 4.1 4.2  --    GFR: Estimated Creatinine Clearance: 33.6 mL/min (A) (by C-G formula based on SCr of 2.31 mg/dL (H)). Liver Function Tests: Recent Labs  Lab 09/17/17 0438 09/18/17 0412 09/19/17 0527 09/20/17 0405 09/21/17 0351  AST 35  --  22  --   --   ALT 236*  --  107*  --   --   ALKPHOS 128*  --  122  --   --   BILITOT 1.0  --  0.7  --   --   PROT 6.0*  --  5.5*  --   --   ALBUMIN 1.8*  1.8* 1.8* 1.7*  1.7* 1.8* 1.8*   No results for input(s): LIPASE, AMYLASE in the last 168 hours. No results for input(s): AMMONIA  in the last 168 hours. Coagulation Profile: Recent Labs  Lab 09/19/17 0900 09/20/17 0405 09/21/17 0351 09/21/17 1918  INR 1.44 1.40 1.60 1.48   Cardiac Enzymes: No results for input(s): CKTOTAL, CKMB, CKMBINDEX, TROPONINI in the last 168 hours. BNP (last 3 results) No results for input(s): PROBNP in the last 8760 hours. HbA1C: No results for input(s): HGBA1C in the last 72 hours. CBG: Recent Labs  Lab 09/20/17 1552 09/20/17 2115 09/21/17 0809 09/21/17 1145 09/21/17 1655  GLUCAP 156* 95 90 99 94   Lipid Profile: No results for input(s): CHOL, HDL, LDLCALC, TRIG, CHOLHDL, LDLDIRECT in the last 72 hours. Thyroid Function Tests: No results for input(s): TSH, T4TOTAL, FREET4, T3FREE, THYROIDAB in the last 72 hours. Anemia Panel: No results for input(s): VITAMINB12, FOLATE, FERRITIN, TIBC, IRON, RETICCTPCT in the last 72 hours. Urine analysis:    Component Value Date/Time   COLORURINE YELLOW 09/13/2017 1408   APPEARANCEUR CLOUDY (A) 09/13/2017 1408   LABSPEC 1.009 09/13/2017 1408   PHURINE 6.0 09/13/2017 1408   GLUCOSEU 50 (A) 09/13/2017 1408   HGBUR MODERATE (A) 09/13/2017 1408   BILIRUBINUR NEGATIVE 09/13/2017 1408   KETONESUR NEGATIVE 09/13/2017 1408   PROTEINUR 30 (A) 09/13/2017 1408   NITRITE NEGATIVE 09/13/2017  1408   LEUKOCYTESUR TRACE (A) 09/13/2017 1408   Sepsis Labs: @LABRCNTIP (procalcitonin:4,lacticidven:4) ) Recent Results (from the past 240 hour(s))  Urine culture     Status: None   Collection Time: 09/12/17 12:48 AM  Result Value Ref Range Status   Specimen Description URINE, RANDOM  Final   Special Requests NONE  Final   Culture NO GROWTH  Final   Report Status 09/13/2017 FINAL  Final  MRSA PCR Screening     Status: None   Collection Time: 09/12/17  3:29 PM  Result Value Ref Range Status   MRSA by PCR NEGATIVE NEGATIVE Final    Comment:        The GeneXpert MRSA Assay (FDA approved for NASAL specimens only), is one component of a comprehensive MRSA colonization surveillance program. It is not intended to diagnose MRSA infection nor to guide or monitor treatment for MRSA infections.      Radiological Exams on Admission: Dg Chest 2 View  Result Date: 09/21/2017 CLINICAL DATA:  Acute onset of hemoptysis earlier this evening. Patient recently discharged from the hospital on anticoagulation for a recent pulmonary embolus. Intestinal perforation with laparotomies in early December, 2018. EXAM: CHEST  2 VIEW COMPARISON:  09/13/2017, 09/12/2017 and earlier, including CT chest 08/07/2017. FINDINGS: AP erect and lateral images were obtained. Cardiac silhouette normal in size for AP technique, unchanged. Thoracic aorta mildly tortuous, unchanged. Hilar and mediastinal contours otherwise unremarkable. Streaky opacities in the left lower lobe. Linear atelectasis in the right middle lobe. Lungs otherwise clear. Small bilateral pleural effusions. Degenerative changes involving the left shoulder. IMPRESSION: 1. Atelectasis and/or bronchopneumonia involving the left lower lobe. 2. Small bilateral pleural effusions. 3. Linear atelectasis in the right middle lobe. Electronically Signed   By: Hulan Saas M.D.   On: 09/21/2017 20:04    EKG: Independently reviewed.   Assessment/Plan Principal  Problem:   Cough with hemoptysis Active Problems:   Pulmonary embolism (HCC)   Hemoptysis    #1 hemoptysis: Most likely reflecting old blood from his PE. Patient is however on warfarin but subtherapeutic INR. He is on Lovenox for bridging. We will admit him for observation and for anticoagulation. Follow CT scan of the chest to see if there is any evidence of hemorrhage.  If so we will hold anticoagulation. Otherwise patient will continue anticoagulation and follow H&H if stable still transfer to skilled facility.  #2 ileostomy: He has had high output in the past. Monitor this closely. It appears his not having much output at the moment.  #3 recent AK I: No change in creatinine. Monitor closely.  #4 recent pulmonary embolism: Continue warfarin and Lovenox bridge as indicated in #1 above.   DVT prophylaxis: Lovenox and warfarin   Code Status: Partial DO NOT RESUSCITATE   Family Communication: None available   Disposition Plan: To SNF   Consults called: None   Admission status: Observation   Severity of Illness: The appropriate patient status for this patient is OBSERVATION. Observation status is judged to be reasonable and necessary in order to provide the required intensity of service to ensure the patient's safety. The patient's presenting symptoms, physical exam findings, and initial radiographic and laboratory data in the context of their medical condition is felt to place them at decreased risk for further clinical deterioration. Furthermore, it is anticipated that the patient will be medically stable for discharge from the hospital within 2 midnights of admission. The following factors support the patient status of observation.   " The patient's presenting symptoms include hemoptysis. " The physical exam findings include No active bleed. " The initial radiographic and laboratory data are No obvious hemorrhage.Marland Kitchen     Lonia Blood MD [facility] Pager [phone]  If 7PM-7AM, please contact night-coverage [email or web address] Password [email or web address]  09/21/2017, 9:05 PM

## 2017-09-21 NOTE — Progress Notes (Addendum)
ANTICOAGULATION CONSULT NOTE - Follow Up Consult  Pharmacy Consult:  Heparin/Coumadin (Overlap D#3/5) Indication: pulmonary embolus and DVT  No Known Allergies  Patient Measurements: Height: 5\' 10"  (177.8 cm) Weight: 159 lb 13.3 oz (72.5 kg) IBW/kg (Calculated) : 73 Heparin Dosing Weight: 71 kg  Vital Signs: Temp: 98.2 F (36.8 C) (02/02 0403) Temp Source: Oral (02/02 0403) BP: 147/81 (02/02 0403) Pulse Rate: 84 (02/02 0403)  Labs: Recent Labs    09/19/17 0527 09/19/17 0900 09/20/17 0405 09/21/17 0351  HGB 7.9*  --  7.6* 7.5*  HCT 24.0*  --  23.2* 23.2*  PLT 324  --  386 467*  LABPROT  --  17.5* 17.0* 18.9*  INR  --  1.44 1.40 1.60  HEPARINUNFRC 0.33  --  0.29* 0.38  CREATININE 3.17*  --  2.71* 2.44*  2.37*    Estimated Creatinine Clearance: 32.7 mL/min (A) (by C-G formula based on SCr of 2.37 mg/dL (H)).    Assessment: [name] continues on IV heparin bridge to Coumadin for PE, bilateral DVT and RA/IVC thrombus.  He is s/p PEA arrest x2 and received TNKase on 09/10/17.  Heparin level is therapeutic but INR subtherapeutic as expected. CBC stable, but no signs/sx of bleeding. Good food intake. No significant DDI noted.   Goal of Therapy:  Heparin level 0.3-0.7 units/ml Monitor platelets by anticoagulation protocol: Yes INR goal 2-3  Plan:  Continue heparin gtt 2250 units/hr Daily heparin level and CBC Monitor clinical course, s/sx bleeding  Repeat warfarin 5mg  PO today Daily PT / INR F/u PO intake, DDI  Addendum Patient's CrCl is variable and borderline 30 mL/min. Patient falls between dosing regimens, therefore heparin switched to lovenox 80 mg subq daily to be given at 1000 today.    Donnella Biyler Donell Tomkins, PharmD PGY1 Acute Care Pharmacy Resident Pager: [phone] 09/21/2017, 8:37 AM

## 2017-09-21 NOTE — Discharge Summary (Addendum)
Physician Discharge Summary  Caleb Singh ZOX:[id number] DOB: 02-Mar-1956 DOA: 09/10/2017  PCP: Patient, No Pcp Per  Admit date: 09/10/2017 Discharge date: 09/21/2017  Admitted From: SNF Disposition:  SNF   Recommendations for Outpatient Follow-up:  1. Follow up with PCP in 1 week 2. Please obtain BMP/CBC in 1 week  3. Continue lovenox/coumadin until INR therapeutic 2-3  4. Monitor ileostomy output closely as it can dehydrate patient if output maintains elevated. Output was improved during hospitalization with dysmotility agents, fiber, and refraining from protein supplement drinks. Follow up with Dr. Gaynelle Adu with questions.   Discharge Condition: Stable CODE STATUS: Partial, No CPR, no Defib, okay with mechanical ventilation, meds, NIPPV  Diet recommendations: Dysphagia 2 (fine chop);Thin liquid Liquids provided via: Straw Medication Administration: Crushed with puree Supervision: Patient able to self feed;Intermittent supervision to cue for compensatory strategies Compensations: Slow rate;Small sips/bites;Clear throat intermittently;Multiple dry swallows after each bite/sip Postural Changes and/or Swallow Maneuvers: Seated upright 90 degrees  Brief/Interim Summary: Caleb Singh is a 62 year old Caucasian male with past medical history of smoking, history of PE, who suffered from a perforated small bowel in December for which he underwent small bowel resection complicated by multisystem failure requiring CRRT. Recovery was also complicated by E. coli bacteremia, respiratory failure, brief arrest and intra-abdominal abscess which was drained percutaneously. He was discharged on 1/12 to rehab. On 1/22 started having worsening shortness of breath. He was undergoing workup to rule out PE and while he was in VQ scan he had worsening dyspnea with a rapid decline and then had PEA arrest. He received systemic tPA due to strong suspicion for pulmonary embolism. Transthoracic  echocardiogram was suspicious for right atrial and IVC blood clot. He was admitted to the intensive care unit. Now he has stabilized and then transferred to the floor. Nephrology has been following for acute renal failure. His ileostomy output has been high leading to acute renal failure.  He was given Imodium, fiber and advised to stop drinking protein drinks and now his output has slowed down.  After hydration with IV fluids, creatinine continued to improve.  He was transitioned from IV heparin to Coumadin/Lovenox bridge.  Discharge Diagnoses:  Principal Problem:   Pulmonary embolism (HCC) Active Problems:   AKI (acute kidney injury) (HCC)   Acute respiratory failure (HCC)   Intestinal perforation s/p SB resection/ileostomy 07/24/2017 & 07/25/2017   Ileostomy in place [facility])   Lactic acidosis   Malnutrition of moderate degree   Cardiac arrest (HCC)   Intramural hematoma of artery of right upper extremity   Acute hypoxic respiratory failure  Most likely secondary to massive PE. Patient was intubated in the intensive care unit. Extubated on 1/24. He seems to be stable from a respiratory standpoint without any new symptoms. Stable on room air today and no respiratory distress or conversational dyspnea.   AKI on CKD 3 Baseline Scr ~1.9-2.1.Nephrology is following and patient has been started on IV fluids. Renal function worsened most likely due tohigh ostomy output. Patient has required CRRT during previous hospitalization. Cr slowly improving.   High ostomy output/Recent perforated small bowel 12/24 with intra-abd abscess Patient noted to have significant output from his ostomy. CT of abdomen pelvis does not show anything concerning. No intra-abdominal fluid collection noted. Reason for high output from his ostomy not clear. Could be due to short gut syndrome. Dr. Rito Ehrlich discussed with general surgery. They recommendedfiber and Imodium.Hold off on feeding supplements and  continue to watch output.   Output decreased over the  last 24 hours and thickened.  Suspect massive pulmonary embolism/RA/IVC thrombus/acute lower extremity DVT F/u echo did no longer visualized RA thrombus. DVT noted bilateral lower extremities. S/p systemic thrombolytics 1/22. Heparin gtt per pharmacy. Will initiate coumadin for disposition. Due to his CKD as well as previous need for CRRT, coumadin may be best option for him. Patient states he was on coumadin years ago when he had a previous PE.  Patient will continue Lovenox/Coumadin bridge until INR remains therapeutic  Shock - cardiogenic/PEA arrest Resolved. Has been off of pressors. Blood pressure is stable  Rightarm swelling He has had swelling of hisrightupper extremity for the past many days. He also has bruising in the right arm. He has good radial pulses. [place] negative for DVT but US revealed complex collection of fluid hematoma vs abscess.  As patient has not had any fevers or worsening leukocytosis, this is most likely a hematoma. Size remains stable and is softer on examination. Continue to elevate, ice  Elevated troponin/history of bradyarrhythmia Patient seen by cardiology for elevated troponin. Thought to be secondary to all of the other underlying issues. No evidence for ischemia. Previous admission he did require temporary pacing wire. No bradyarrhythmias noted during this hospitalization yet.  Transaminitis Most likely due to shock liver. Improved.   Normocytic anemia with likely component of acute blood loss Stable  Recent hospitalization for intra-abdominal abscess and E. coli bacteremia  Blood cultures have been negative however patient's pro calcitonin was noted to be significantly elevated at 50.13. Repeat levels show improvement to 6.87. Was on meropenem empirically. Antibiotics discontinued 1/27. CT scan did not show any intra-abdominal infectious etiology. Blood cultures have been negative.  He remains afebrile. WBC has been elevated between 11-15 but stable  Hyperglycemia  SSI. CBG's stable.   Acute metabolic encephalopathy Back to baseline  Dysphagia/Nutrition Diet has been advanced. Tube feedings were discontinued on 1/28.  Malnutrition of moderate degree Encourage oral intake.     Discharge Instructions   Allergies as of 09/21/2017   No Known Allergies     Medication List    STOP taking these medications   amLODipine 5 MG tablet Commonly known as:  NORVASC   feeding supplement (ENSURE ENLIVE) Liqd   hydrALAZINE 25 MG tablet Commonly known as:  APRESOLINE     TAKE these medications   albuterol 108 (90 Base) MCG/ACT inhaler Commonly known as:  PROVENTIL HFA;VENTOLIN HFA Inhale 2 puffs into the lungs every 6 (six) hours as needed for wheezing or shortness of breath.   BREO ELLIPTA 100-25 MCG/INH Aepb Generic drug:  fluticasone furoate-vilanterol Inhale 1 puff into the lungs daily.   diphenoxylate-atropine 2.5-0.025 MG tablet Commonly known as:  LOMOTIL Take 2 tablets by mouth 4 (four) times daily.   enoxaparin 80 MG/0.8ML injection Commonly known as:  LOVENOX Inject 0.8 mLs (80 mg total) into the skin daily for 7 days. Start taking on:  09/22/2017   loperamide 2 MG capsule Commonly known as:  IMODIUM Take 2 capsules (4 mg total) by mouth 4 (four) times daily. What changed:    how much to take  when to take this   psyllium 95 % Pack Commonly known as:  HYDROCIL/METAMUCIL Take 1 packet by mouth daily.   warfarin 5 MG tablet Commonly known as:  COUMADIN Take 1 tablet (5 mg total) by mouth one time only at 6 PM.       No Known Allergies  Consultations:  PCCM  Cardiology  Nephrology    Procedures/Studies: Ct  Abdomen Pelvis Wo Contrast  Result Date: 09/15/2017 CLINICAL DATA:  62 year old male with a history of possible GI bleeding and ascites EXAM: CT ABDOMEN AND PELVIS WITHOUT CONTRAST TECHNIQUE: Multidetector CT  imaging of the abdomen and pelvis was performed following the standard protocol without IV contrast. COMPARISON:  CT 08/22/2017, 08/17/2017 FINDINGS: Lower chest: Mixed ground-glass and nodular opacity of the right lower lobe. Small bilateral pleural effusions with associated atelectasis. Hepatobiliary: Unremarkable appearance of liver parenchyma. Hyperdense material within the gallbladder lumen, new from the comparison. Pericholecystic fluid. No intrahepatic or extrahepatic biliary ductal dilatation. Pancreas: Unremarkable appearance of pancreas Spleen: Unremarkable spleen Adrenals/Urinary Tract: Unremarkable adrenal glands. Right kidney with no hydronephrosis or nephrolithiasis. Redemonstration of cyst of the posterior right kidney cortex. No left-sided hydronephrosis or nephrolithiasis. Hyperdense focus in the posterior left kidney cortex, unchanged. Urinary catheter within the urinary bladder which is decompressed. Gas within the urinary bladder, likely secondary to manipulation. Stomach/Bowel: Gastric tube terminates within the stomach lumen. No evidence of abnormally distended small bowel or colon. Ostomy within the right abdomen again demonstrated. Surgical changes at the right lower quadrant of prior bowel resection. Evaluation of small bowel and colon wall limited by the exclusion of contrast. There are no focal loops of small bowel or colon with hyperdense material that would indicate accumulating blood products. Vascular/Lymphatic: Scattered vascular calcifications. Small lymph nodes in the base of the mesentery and periaortic nodal stations, unchanged. There is diffuse missed the mesentery and hazy infiltration of the fat within the abdomen. Re-demonstration of infiltration of the lower chest and abdominal wall. Reproductive: Unremarkable pelvic structures. Other: Midline surgical changes. Since the prior CT, there has been interval removal of the pigtail drainage catheter of the right lower quadrant, as  well as the surgical drains of the upper abdomen. Musculoskeletal: No acute displaced fracture. Multilevel degenerative changes of the thoracic and lumbar spine. Schmorl's node of the lower L1 endplate. IMPRESSION: There is no CT finding that would confirm or exclude GI hemorrhage. If there is ongoing clinical concern, a nuclear medicine tagged red bowel study may be considered. Hyperdense material within the gallbladder lumen, may reflect vicarious excretion of contrast or microlithiasis. If there is concern for acute biliary obstruction, correlation with physical exam and HIDA study may be considered. Diffuse abdominal and body wall edema/anasarca. Airspace disease of the right lower lobe, concerning for pneumonia. Interval removal of pigtail drainage catheter and surgical drains. Redemonstration of surgical changes along the midline abdomen and ostomy, status post bowel resection. Small bilateral pleural effusions. Electronically Signed   By: Gilmer Mor D.O.   On: 09/15/2017 10:13   Dg Elbow Complete Right (3+view)  Result Date: 09/18/2017 CLINICAL DATA:  Right elbow pain and swelling for 3 days. No injury. EXAM: RIGHT ELBOW - COMPLETE 3+ VIEW COMPARISON:  None. FINDINGS: There is no evidence of fracture, dislocation, or joint effusion. There is no evidence of arthropathy or other focal bone abnormality. Soft tissues are unremarkable. IMPRESSION: Negative. Electronically Signed   By: Burman Nieves M.D.   On: 09/18/2017 00:22   Dg Chest Portable 1 View  Result Date: 09/13/2017 CLINICAL DATA:  Respiratory failure. EXAM: PORTABLE CHEST 1 VIEW COMPARISON:  One-view chest x-ray 09/12/2017. FINDINGS: Heart size is exaggerated by low lung volumes. Patient has been extubated. The NG tube courses off the inferior border of the film. Bilateral pleural effusions and airspace disease have increased. Interstitial edema has increased. IMPRESSION: 1. Interval increase in bibasilar airspace disease and associated  effusions. While this may  represent atelectasis, infection is not excluded. Findings are worse on the right. 2. New bilateral edema. 3. Interval extubation. Electronically Signed   By: Marin Roberts M.D.   On: 09/13/2017 08:39   Dg Chest Portable 1 View  Result Date: 09/12/2017 CLINICAL DATA:  Respiratory failure.  Ventilator support. EXAM: PORTABLE CHEST 1 VIEW COMPARISON:  09/11/2017 FINDINGS: Endotracheal tube tip is 5 cm above the carina. Nasogastric tube enters the stomach. Mild bibasilar infiltrate in volume loss right more than left with small amount of pleural fluid on the right. IMPRESSION: Endotracheal tube and nasogastric tube well positioned. Bibasilar volume loss/infiltrate right more than left. Electronically Signed   By: Paulina Fusi M.D.   On: 09/12/2017 06:50   Portable Chest Xray  Result Date: 09/11/2017 CLINICAL DATA:  Check endotracheal tube intubation EXAM: PORTABLE CHEST 1 VIEW COMPARISON:  09/10/2017 FINDINGS: Endotracheal tube and gastric catheter are noted in satisfactory position. Cardiac shadow is stable. The lungs are well aerated bilaterally with tiny pleural effusions bilaterally. No focal infiltrate is noted. No bony abnormality is seen. IMPRESSION: Gastric catheter now in satisfactory position. The remainder of the exam is relatively stable. Electronically Signed   By: Alcide Clever M.D.   On: 09/11/2017 07:04   Portable Chest X-ray  Result Date: 09/10/2017 CLINICAL DATA:  Endotracheal tube placement.  Post cardiac arrest. EXAM: PORTABLE CHEST 1 VIEW COMPARISON:  09/10/2017 FINDINGS: Extrinsic artifacts are present. Endotracheal tube has been placed with tip measuring 6.4 cm above the carina. Shallow inspiration. Heart size and pulmonary vascularity are normal. Lungs are clear. No pneumothorax. No pleural effusions. Degenerative changes in the spine. IMPRESSION: Endotracheal tube tip measures 6.4 cm above the carina. Lungs are clear. Electronically Signed   By:  Burman Nieves M.D.   On: 09/10/2017 22:57   Dg Chest Portable 1 View  Result Date: 09/10/2017 CLINICAL DATA:  Sudden onset shortness of breath, low O2 sats EXAM: PORTABLE CHEST 1 VIEW COMPARISON:  08/29/2017 FINDINGS: Heart and mediastinal contours are within normal limits. No focal opacities or effusions. No acute bony abnormality. IMPRESSION: No active disease. Electronically Signed   By: Charlett Nose M.D.   On: 09/10/2017 11:35   Dg Chest Port 1 View  Result Date: 08/29/2017 CLINICAL DATA:  Dyspnea EXAM: PORTABLE CHEST 1 VIEW COMPARISON:  08/17/2017 FINDINGS: Right PICC line tip: SVC. Stable appearance of scarring or atelectasis along the right lateral costophrenic angle. Improved aeration in the left retrocardiac region. The lungs remain otherwise clear. The prior transvenous pacer lead is absent. Heart size within normal limits. IMPRESSION: 1. Scarring at the right lung base. Slightly improved aeration in the left lower lobe with some minimal linear residual opacities. Electronically Signed   By: Gaylyn Rong M.D.   On: 08/29/2017 11:32   Dg Abd Portable 1v  Result Date: 09/12/2017 CLINICAL DATA:  Check nasogastric catheter placement EXAM: PORTABLE ABDOMEN - 1 VIEW COMPARISON:  09/10/2017 FINDINGS: Gastric catheter is noted within the mid stomach slightly advanced when compared with the prior exam. Right lower quadrant ostomy is noted. No obstructive changes are seen. Degenerative change of the lumbar spine is noted. IMPRESSION: Nasogastric catheter within the stomach. Electronically Signed   By: Alcide Clever M.D.   On: 09/12/2017 21:23   Dg Abd Portable 1v  Result Date: 09/11/2017 CLINICAL DATA:  OG tube placement. EXAM: PORTABLE ABDOMEN - 1 VIEW COMPARISON:  CT abdomen and pelvis 08/22/2017.  Abdomen 08/20/2017 FINDINGS: Enteric tube tip is in the left upper quadrant consistent with  location in the upper stomach. Visualized bowel loops are not distended. IMPRESSION: Enteric tube tip  projects over the upper stomach. Electronically Signed   By: Burman NievesWilliam  Stevens M.D.   On: 09/11/2017 00:14   Dg Swallowing Func-speech Pathology  Result Date: 09/16/2017 Objective Swallowing Evaluation: Type of Study: MBS-Modified Barium Swallow Study  Patient Details Name: Lamar LaundryFrederick Borgeson MRN: [id number] Date of Birth: November 20, 1955 Today's Date: 09/16/2017 Time: SLP Start Time (ACUTE ONLY): 1409 -SLP Stop Time (ACUTE ONLY): 1439 SLP Time Calculation (min) (ACUTE ONLY): 30 min Past Medical History: Past Medical History: Diagnosis Date . Bowel obstruction (HCC)  . Pneumothorax  Past Surgical History: Past Surgical History: Procedure Laterality Date . COLOSTOMY   . I&D EXTREMITY Right 04/17/2016  Procedure: IRRIGATION AND DEBRIDEMENT RIGHT HAND;  Surgeon: Dominica SeverinWilliam Gramig, MD;  [facility];  Service: Orthopedics;  Laterality: Right; . I&D EXTREMITY Right 04/19/2016  Procedure: REPEAT I&D RIGHT HAND;  Surgeon: Dominica SeverinWilliam Gramig, MD;  Location: [facility] OR;  Service: Orthopedics;  Laterality: Right; . LAPAROTOMY N/A 07/23/2017  Procedure: EXPLORATORY LAPAROTOMY, SMALL BOWEL RESECTION;  Surgeon: Gaynelle AduWilson, Eric, MD;  Location: [facility] ORS;  Service: General;  Laterality: N/A; . LAPAROTOMY N/A 07/25/2017  Procedure: EXPLORATORY LAPAROSCOPY WITH ILEOCECTOMY, END ILEOSTOMY;  Surgeon: Almond LintByerly, Faera, MD;  Location: [facility] ORS;  Service: General;  Laterality: N/A;  PATIENT ABDOMINAL WOUND LEFT OPEN AND PACKED WITH BULKY DRESSING . LAPAROTOMY N/A 07/31/2017  Procedure: EXPLORATORY LAPAROTOMY drainage of abdominal abcess;  Surgeon: Glenna FellowsHoxworth, Benjamin, MD;  Location: [facility] ORS;  Service: General;  Laterality: N/A; . LEFT HEART CATH AND CORONARY ANGIOGRAPHY N/A 08/13/2017  Procedure: LEFT HEART CATH AND CORONARY ANGIOGRAPHY;  Surgeon: Dolores PattyBensimhon, Daniel R, MD;  Location: [facility] INVASIVE CV LAB;  Service: Cardiovascular;  Laterality: N/A; HPI: Pt is a 62 y.o.malewith PMH of PE, tobacco abuse, and recent hospitalization for small bowel perforation from  07/23/2017-08/31/2017 complicated by multisystem failure. Discharged to a SNF 08/31/17, however returned 09/10/17 with DOE and chest tightness, with subsequent respiratory failure from presumed massive PE, with PEA arrest x2 (1/22). Pt intubated 1/22 to 1/24. Failed post-extubation screen. CXR (1/24) revealing interval increase in bibasilar airspace disease and associated effusions. While this may represent atelectasis, infection is not excluded. Findings are worse on the right.  Subjective: Pt positioned in chair and cooperative throughout assessment Assessment / Plan / Recommendation [facility] IP CLINICAL IMPRESSIONS 09/16/2017 Clinical Impression Pt presents with mild-moderate pharyngeal dysphagia characterized by penetration/aspiration of nectar thick and thin liquids and moderate residue due to generalized weakness of swallowing musculature, reduced airway closure, and inadequate clearance through the UES. Pt's oral phase was mildly impaired by anterior mastication, premature swallow, and piecemeal swallow. Mod verbal cues to clear throat and repeat swallow were used to clear airway of penetration and some aspiration with all liquids. Chin tuck was attempted with thin liquids, but proven not effective given aspiration event. Pt reduced pharyngeal residue using spontaneous and cued multiple swallows. Residue at the UES and pyriform sinus likely impacted by reduced pharyngeal peristalsis and reduced UES opening given presence of large NG tube. Therefore, removal of NG tube, when medically appropriate, will likely benefit swallow function. Given pt's oral phase deficits, pharyngeal residue, and reduced airway protection, recommend thin liquid and Dysphagia 2 (chopped) diet. Aspiration precautions include small/slow bites/sips, intermittent throat clear with sips of liquid, multiple swallows, and siting up-right for oral intake. Pt swallow function likely to improve with removal of NG tube and reconditioning of musculature as  pt resumes diet. SLP will follow up  to monitor pt's safety with diet and potential for advancement.   SLP Visit Diagnosis Dysphagia, oropharyngeal phase (R13.12) Attention and concentration deficit following -- Frontal lobe and executive function deficit following -- Impact on safety and function Moderate aspiration risk   [facility] IP TREATMENT RECOMMENDATION 09/16/2017 Treatment Recommendations Therapy as outlined in treatment plan below   Prognosis 09/16/2017 Prognosis for Safe Diet Advancement Good Barriers to Reach Goals Cognitive deficits Barriers/Prognosis Comment -- [facility] IP DIET RECOMMENDATION 09/16/2017 SLP Diet Recommendations Dysphagia 2 (Fine chop) solids;Thin liquid Liquid Administration via Straw Medication Administration Crushed with puree Compensations Slow rate;Small sips/bites;Multiple dry swallows after each bite/sip;Clear throat intermittently Postural Changes Seated upright at 90 degrees   CH[facility] IP OTHER RECOMMENDATIONS 09/16/2017 Recommended Consults -- Oral Care Recommendations Oral care BID Other Recommendations --   [facility] IP FOLLOW UP RECOMMENDATIONS 09/16/2017 Follow up Recommendations Skilled Nursing facility   [facility] IP FREQUENCY AND DURATION 09/16/2017 Speech Therapy Frequency (ACUTE ONLY) min 2x/week Treatment Duration 2 weeks      [facility] IP ORAL PHASE 09/16/2017 Oral Phase Impaired Oral - Pudding Teaspoon -- Oral - Pudding Cup -- Oral - Honey Teaspoon -- Oral - Honey Cup -- Oral - Nectar Teaspoon -- Oral - Nectar Cup -- Oral - Nectar Straw Piecemeal swallowing;Premature spillage Oral - Thin Teaspoon -- Oral - Thin Cup -- Oral - Thin Straw Piecemeal swallowing;Premature spillage Oral - Puree Piecemeal swallowing Oral - Mech Soft -- Oral - Regular Piecemeal swallowing;Impaired mastication Oral - Multi-Consistency -- Oral - Pill -- Oral Phase - Comment --  [facility] IP PHARYNGEAL PHASE 09/16/2017 Pharyngeal Phase Impaired Pharyngeal- Pudding Teaspoon -- Pharyngeal -- Pharyngeal- Pudding Cup -- Pharyngeal -- Pharyngeal-  Honey Teaspoon -- Pharyngeal -- Pharyngeal- Honey Cup -- Pharyngeal -- Pharyngeal- Nectar Teaspoon -- Pharyngeal -- Pharyngeal- Nectar Cup -- Pharyngeal -- Pharyngeal- Nectar Straw Pharyngeal residue - cp segment;Pharyngeal residue - pyriform;Pharyngeal residue - valleculae;Reduced pharyngeal peristalsis;Penetration/Aspiration during swallow;Reduced airway/laryngeal closure;Reduced epiglottic inversion Pharyngeal Material enters airway, passes BELOW cords without attempt by patient to eject out (silent aspiration) Pharyngeal- Thin Teaspoon -- Pharyngeal -- Pharyngeal- Thin Cup -- Pharyngeal -- Pharyngeal- Thin Straw Compensatory strategies attempted (with notebox);Pharyngeal residue - cp segment;Pharyngeal residue - pyriform;Pharyngeal residue - valleculae;Reduced pharyngeal peristalsis;Penetration/Aspiration during swallow;Reduced epiglottic inversion;Reduced airway/laryngeal closure Pharyngeal Material enters airway, passes BELOW cords without attempt by patient to eject out (silent aspiration) Pharyngeal- Puree Pharyngeal residue - cp segment;Pharyngeal residue - pyriform;Pharyngeal residue - valleculae;Reduced pharyngeal peristalsis;Reduced epiglottic inversion;Reduced airway/laryngeal closure Pharyngeal -- Pharyngeal- Mechanical Soft -- Pharyngeal -- Pharyngeal- Regular Pharyngeal residue - cp segment;Pharyngeal residue - pyriform;Pharyngeal residue - valleculae;Reduced pharyngeal peristalsis;Reduced epiglottic inversion;Reduced airway/laryngeal closure Pharyngeal -- Pharyngeal- Multi-consistency -- Pharyngeal -- Pharyngeal- Pill -- Pharyngeal -- Pharyngeal Comment --  [facility] IP CERVICAL ESOPHAGEAL PHASE 09/16/2017 Cervical Esophageal Phase Impaired Pudding Teaspoon -- Pudding Cup -- Honey Teaspoon -- Honey Cup -- Nectar Teaspoon -- Nectar Cup -- Nectar Straw Reduced cricopharyngeal relaxation Thin Teaspoon -- Thin Cup -- Thin Straw Reduced cricopharyngeal relaxation Puree Reduced cricopharyngeal relaxation  Mechanical Soft -- Regular Reduced cricopharyngeal relaxation Multi-consistency -- Pill -- Cervical Esophageal Comment -- No flowsheet data found. Maxcine Ham 09/16/2017, 4:15 PM  Note populated for [place] Jarrett, Student SLP Maxcine Ham, M.A. CCC-SLP [phone]             [place] Rt Upper Extrem Ltd Soft Tissue Non Vascular  Result Date: 09/18/2017 CLINICAL DATA:  Right arm swelling for 4 days. EXAM: ULTRASOUND right UPPER EXTREMITY LIMITED TECHNIQUE: Ultrasound examination of the upper extremity soft tissues  was performed in the area of clinical concern. COMPARISON:  None. FINDINGS: Images obtained in the right antecubital fossa demonstrate a complex septated cystic and hyperechoic collection measuring 6.5 x 5 x 6.9 cm. No flow is demonstrated in the area on color flow Doppler imaging. Appearance is most consistent with either a hematoma or an abscess. IMPRESSION: Complex collection in the right antecubital fossa measuring up to 6.9 cm diameter. Appearance is most consistent with hematoma or abscess. Electronically Signed   By: Burman Nieves M.D.   On: 09/18/2017 00:25    Echo 1/22 Study Conclusions  - Left ventricle: The cavity size was normal. There was mild   concentric hypertrophy. Systolic function was vigorous. The   estimated ejection fraction was in the range of 65% to 70%. Wall   motion was normal; there were no regional wall motion   abnormalities. There was an increased relative contribution of   atrial contraction to ventricular filling. Doppler parameters are   consistent with abnormal left ventricular relaxation (grade 1   diastolic dysfunction). - Ventricular septum: The contour showed moderate diastolic   flattening and moderate systolic flattening. These changes are   consistent with RV volume and pressure overload. - Right ventricle: The cavity size was severely dilated. Wall   thickness was normal. Systolic function was severely reduced. - Right atrium: There  is a large oblong mobile density within the   RA cavity measuring 4.63cm x 1cm which is protruding through the   TV and is consistent with very large thrombus. The atrium was   moderately dilated. - Pulmonary arteries: PA peak pressure: 32 mm Hg (S). - Impressions: Hyperdynamic LVF EF 65-70%. Severely dilated RV with   severe RV dysfunction, RA enlargement. There is a large elongated   mobile mass measuring 4.63cm x 1 cm in the RA that intermittently   protrudes through the TV c/w thrombus. There is also suspicion of   thrombus in the IVC.  Impressions:  - Hyperdynamic LVF EF 65-70%. Severely dilated RV with severe RV   dysfunction, RA enlargement. There is a large elongated mobile   mass measuring 4.63cm x 1 cm in the RA that intermittently   protrudes through the TV c/w thrombus. There is also suspicion of   thrombus in the IVC.   Echo 1/23  Study Conclusions  - Left ventricle: The cavity size was normal. Wall thickness was   increased in a pattern of mild LVH. Systolic function was normal.   The estimated ejection fraction was in the range of 60% to 65%.   Wall motion was normal; there were no regional wall motion   abnormalities. - Ventricular septum: Septal motion showed &quot;bounce&quot;. - Aortic valve: Trileaflet; mildly thickened, mildly calcified   leaflets. - Right ventricle: The cavity size was severely dilated. Wall   thickness was normal. Systolic function was severely reduced. - Pulmonic valve: There was trivial regurgitation.  Impressions:   - Thrombus in RA no longer seen.   Discharge Exam: Vitals:   09/21/17 0403 09/21/17 1352  BP: (!) 147/81 117/75  Pulse: 84 89  Resp: (!) 27   Temp: 98.2 F (36.8 C) 98.4 F (36.9 C)  SpO2: 97% 100%   Vitals:   09/20/17 1605 09/20/17 1954 09/21/17 0403 09/21/17 1352  BP: 136/84 138/78 (!) 147/81 117/75  Pulse: 83 86 84 89  Resp: 16 (!) 21 (!) 27   Temp: 98 F (36.7 C) 98.7 F (37.1 C) 98.2 F (36.8  C) 98.4 F (36.9  C)  TempSrc: Axillary Oral Oral Oral  SpO2: 99% 99% 97% 100%  Weight:   72.5 kg (159 lb 13.3 oz)   Height:        General: Pt is alert, awake, not in acute distress Cardiovascular: RRR, S1/S2 +, no rubs, no gallops Respiratory: CTA bilaterally, no wheezing, no rhonchi Abdominal: Soft, NT, ND, bowel sounds + Extremities: +right upper extremity with edema, warmth.  Size of his hematoma of the right arm has decreased over the past few days.  Bruising has improved as well.    The results of significant diagnostics from this hospitalization (including imaging, microbiology, ancillary and laboratory) are listed below for reference.     Microbiology: Recent Results (from the past 240 hour(s))  Urine culture     Status: None   Collection Time: 09/12/17 12:48 AM  Result Value Ref Range Status   Specimen Description URINE, RANDOM  Final   Special Requests NONE  Final   Culture NO GROWTH  Final   Report Status 09/13/2017 FINAL  Final  MRSA PCR Screening     Status: None   Collection Time: 09/12/17  3:29 PM  Result Value Ref Range Status   MRSA by PCR NEGATIVE NEGATIVE Final    Comment:        The GeneXpert MRSA Assay (FDA approved for NASAL specimens only), is one component of a comprehensive MRSA colonization surveillance program. It is not intended to diagnose MRSA infection nor to guide or monitor treatment for MRSA infections.      Labs: BNP (last 3 results) Recent Labs    09/10/17 1124  BNP 189.9*   Basic Metabolic Panel: Recent Labs  Lab 09/17/17 0438 09/18/17 0412 09/19/17 0527 09/20/17 0405 09/21/17 0351  NA 137 132* 136 138 138  138  K 3.7 4.0 3.5 3.5 3.4*  3.5  CL 102 101 99* 98* 98*  98*  CO2 23 19* 26 29 30  30   GLUCOSE 92 91 96 94 94  94  BUN 60* 54* 43* 34* 26*  25*  CREATININE 4.67* 3.91* 3.17* 2.71* 2.44*  2.37*  CALCIUM 7.8* 7.7* 7.7* 7.8* 8.0*  8.0*  PHOS 4.4 4.8* 4.8* 4.1 4.2   Liver Function Tests: Recent Labs   Lab 09/17/17 0438 09/18/17 0412 09/19/17 0527 09/20/17 0405 09/21/17 0351  AST 35  --  22  --   --   ALT 236*  --  107*  --   --   ALKPHOS 128*  --  122  --   --   BILITOT 1.0  --  0.7  --   --   PROT 6.0*  --  5.5*  --   --   ALBUMIN 1.8*  1.8* 1.8* 1.7*  1.7* 1.8* 1.8*   No results for input(s): LIPASE, AMYLASE in the last 168 hours. No results for input(s): AMMONIA in the last 168 hours. CBC: Recent Labs  Lab 09/17/17 0438 09/18/17 0412 09/19/17 0527 09/20/17 0405 09/21/17 0351  WBC 14.1* 13.7* 14.0* 15.9* 15.7*  HGB 7.7* 7.7* 7.9* 7.6* 7.5*  HCT 24.1* 23.7* 24.0* 23.2* 23.2*  MCV 89.9 89.1 88.6 90.3 90.6  PLT 230 277 324 386 467*   Cardiac Enzymes: No results for input(s): CKTOTAL, CKMB, CKMBINDEX, TROPONINI in the last 168 hours. BNP: Invalid input(s): POCBNP CBG: Recent Labs  Lab 09/20/17 1132 09/20/17 1552 09/20/17 2115 09/21/17 0809 09/21/17 1145  GLUCAP 113* 156* 95 90 99   D-Dimer No results for input(s): DDIMER in the last  72 hours. Hgb A1c No results for input(s): HGBA1C in the last 72 hours. Lipid Profile No results for input(s): CHOL, HDL, LDLCALC, TRIG, CHOLHDL, LDLDIRECT in the last 72 hours. Thyroid function studies No results for input(s): TSH, T4TOTAL, T3FREE, THYROIDAB in the last 72 hours.  Invalid input(s): FREET3 Anemia work up No results for input(s): VITAMINB12, FOLATE, FERRITIN, TIBC, IRON, RETICCTPCT in the last 72 hours. Urinalysis    Component Value Date/Time   COLORURINE YELLOW 09/13/2017 1408   APPEARANCEUR CLOUDY (A) 09/13/2017 1408   LABSPEC 1.009 09/13/2017 1408   PHURINE 6.0 09/13/2017 1408   GLUCOSEU 50 (A) 09/13/2017 1408   HGBUR MODERATE (A) 09/13/2017 1408   BILIRUBINUR NEGATIVE 09/13/2017 1408   KETONESUR NEGATIVE 09/13/2017 1408   PROTEINUR 30 (A) 09/13/2017 1408   NITRITE NEGATIVE 09/13/2017 1408   LEUKOCYTESUR TRACE (A) 09/13/2017 1408   Sepsis Labs Invalid input(s): PROCALCITONIN,  WBC,   LACTICIDVEN Microbiology Recent Results (from the past 240 hour(s))  Urine culture     Status: None   Collection Time: 09/12/17 12:48 AM  Result Value Ref Range Status   Specimen Description URINE, RANDOM  Final   Special Requests NONE  Final   Culture NO GROWTH  Final   Report Status 09/13/2017 FINAL  Final  MRSA PCR Screening     Status: None   Collection Time: 09/12/17  3:29 PM  Result Value Ref Range Status   MRSA by PCR NEGATIVE NEGATIVE Final    Comment:        The GeneXpert MRSA Assay (FDA approved for NASAL specimens only), is one component of a comprehensive MRSA colonization surveillance program. It is not intended to diagnose MRSA infection nor to guide or monitor treatment for MRSA infections.      Time coordinating discharge: 40 minutes  SIGNED:  Noralee Stain, DO [facility] Pager [phone]  If 7PM-7AM, please contact night-coverage [email or web address] Password [facility] 09/21/2017, 2:54 PM

## 2017-09-21 NOTE — ED Notes (Signed)
Family at bedside. 

## 2017-09-21 NOTE — ED Triage Notes (Signed)
Pt presents with [facility] who was attempting to transport patient back to [facility] after discharge from [facility]; pt began coughing up bloody sputum; pt with recent ileostomy

## 2017-09-21 NOTE — ED Notes (Signed)
Pt returned from xray

## 2017-09-21 NOTE — Progress Notes (Signed)
[facility] Anticipated DC Date:09-21-17 Family Notified:yes Gorden HarmsDesiree Davis [phone] Transport WG:NFAOBy:[facility]   Per MD [facility] . RN, patient, patient's family, and facility notified of DC. Assessment, Fl2/Pasrr, and Discharge Summary sent to facility. RN given number for report [phone]). DC packet on chart. Ambulance transport requested for patient.   CSW signing off.  Budd Palmerara Morgan Keinath LCSWA [phone]

## 2017-09-21 NOTE — Progress Notes (Signed)
Assessment/Plan:62 year old male withh/oacute kidney injury requiring dialysis in December 2018.Now presents withAKI afterPEA arrest andprolonged hypotension   1.AKI, nonoliguric- Creatinine was noted to be 1.1 in September 2017. After Francee NodalAKIin December it appears his creatinine nadir was around 2.We will sign off 2. Hypertension/volumeOK 3.Hypokalemia, supp prn   Subjective: Interval History: Feels better  Objective: Vital signs in last 24 hours: Temp:  [98 F (36.7 C)-98.7 F (37.1 C)] 98.4 F (36.9 C) (02/02 1352) Pulse Rate:  [83-89] 89 (02/02 1352) Resp:  [16-27] 27 (02/02 0403) BP: (117-147)/(75-84) 117/75 (02/02 1352) SpO2:  [97 %-100 %] 100 % (02/02 1352) Weight:  [72.5 kg (159 lb 13.3 oz)] 72.5 kg (159 lb 13.3 oz) (02/02 0403) Weight change: 2.283 kg (5 lb 0.5 oz)  Intake/Output from previous day: 02/01 0701 - 02/02 0700 In: 2328.9 [P.O.:1640; I.V.:688.9] Out: 3005 [Urine:2305; Stool:700] Intake/Output this shift: Total I/O In: 1080 [P.O.:1080] Out: 1225 [Urine:1225]  General appearance: alert and cooperative Extremities: extremities normal, atraumatic, no cyanosis or edema  Lab Results: Recent Labs    09/20/17 0405 09/21/17 0351  WBC 15.9* 15.7*  HGB 7.6* 7.5*  HCT 23.2* 23.2*  PLT 386 467*   BMET:  Recent Labs    09/20/17 0405 09/21/17 0351  NA 138 138  138  K 3.5 3.4*  3.5  CL 98* 98*  98*  CO2 29 30  30   GLUCOSE 94 94  94  BUN 34* 26*  25*  CREATININE 2.71* 2.44*  2.37*  CALCIUM 7.8* 8.0*  8.0*   No results for input(s): PTH in the last 72 hours. Iron Studies: No results for input(s): IRON, TIBC, TRANSFERRIN, FERRITIN in the last 72 hours. Studies/Results: No results found.  Scheduled: . collagenase   Topical Daily  . darbepoetin (ARANESP) injection - NON-DIALYSIS  100 mcg Subcutaneous Q Fri-1800  . diphenoxylate-atropine  2 tablet Oral QID  . enoxaparin (LOVENOX) injection  80 mg Subcutaneous Q24H  . insulin  aspart  0-9 Units Subcutaneous TID WC  . loperamide  4 mg Oral QID  . mouth rinse  15 mL Mouth Rinse BID  . psyllium  1 packet Oral TID  . warfarin  5 mg Oral ONCE-1800  . Warfarin - Pharmacist Dosing Inpatient   Does not apply q1800     LOS: 11 days   Caleb Singh C Caleb Singh 09/21/2017,2:33 PM

## 2017-09-22 ENCOUNTER — Other Ambulatory Visit: Payer: Self-pay

## 2017-09-22 ENCOUNTER — Encounter (HOSPITAL_COMMUNITY): Payer: Self-pay | Admitting: General Practice

## 2017-09-22 DIAGNOSIS — I2602 Saddle embolus of pulmonary artery with acute cor pulmonale: Secondary | ICD-10-CM

## 2017-09-22 DIAGNOSIS — R042 Hemoptysis: Secondary | ICD-10-CM

## 2017-09-22 LAB — CBC
HEMATOCRIT: 25.5 % — AB (ref 39.0–52.0)
Hemoglobin: 8.1 g/dL — ABNORMAL LOW (ref 13.0–17.0)
MCH: 29.5 pg (ref 26.0–34.0)
MCHC: 31.8 g/dL (ref 30.0–36.0)
MCV: 92.7 fL (ref 78.0–100.0)
Platelets: 599 10*3/uL — ABNORMAL HIGH (ref 150–400)
RBC: 2.75 MIL/uL — ABNORMAL LOW (ref 4.22–5.81)
RDW: 15.9 % — AB (ref 11.5–15.5)
WBC: 13.5 10*3/uL — AB (ref 4.0–10.5)

## 2017-09-22 LAB — BASIC METABOLIC PANEL
Anion gap: 14 (ref 5–15)
BUN: 19 mg/dL (ref 6–20)
CHLORIDE: 98 mmol/L — AB (ref 101–111)
CO2: 27 mmol/L (ref 22–32)
Calcium: 8.4 mg/dL — ABNORMAL LOW (ref 8.9–10.3)
Creatinine, Ser: 2.07 mg/dL — ABNORMAL HIGH (ref 0.61–1.24)
GFR calc Af Amer: 38 mL/min — ABNORMAL LOW (ref >60)
GFR calc non Af Amer: 32 mL/min — ABNORMAL LOW (ref >60)
Glucose, Bld: 136 mg/dL — ABNORMAL HIGH (ref 65–99)
POTASSIUM: 3.4 mmol/L — AB (ref 3.5–5.1)
SODIUM: 139 mmol/L (ref 135–145)

## 2017-09-22 LAB — PROTIME-INR
INR: 1.67
Prothrombin Time: 19.5 seconds — ABNORMAL HIGH (ref 11.4–15.2)

## 2017-09-22 NOTE — Clinical Social Work Note (Signed)
Clinical Social Worker received phone call from RN stating that patient never made it to the facility last night due to coughing up bloody sputum while in the ambulance.  CSW spoke with supervisor, Lowella BandyNikki, [facility] who reviewed new discharge summary and states that patient can return today.  CSW left message with patient sister.  CSW to facilitate patient discharge plan.  Macario GoldsJesse Elanda Garmany, [place] [phone]

## 2017-09-22 NOTE — Clinical Social Work Note (Signed)
Clinical Social Worker facilitated patient discharge including contacting patient and facility to confirm patient discharge plans.  Clinical information faxed to facility and patient agreeable with plan.  CSW left message with patient sister - patient to also attempt to call with update.  CSW arranged ambulance transport [facility].  RN to call report prior to discharge.  Clinical Social Worker will sign off for now as social work intervention is no longer needed. Please consult us again if new need arises.  Caleb Singh, [place] [phone]

## 2017-09-22 NOTE — Discharge Summary (Signed)
Physician Discharge Summary  Lamar LaundryFrederick Markel ZOX:[id number] DOB: 1955/12/17 DOA: 09/21/2017  PCP: Patient, No Pcp Per  Admit date: 09/21/2017 Discharge date: 09/22/2017  Admitted From: SNF Disposition:  SNF  Recommendations for Outpatient Follow-up:  1. Follow up with PCP in 1 week 2. Please obtain BMP/CBC in 1 week  3. Continue lovenox/coumadin until INR therapeutic 2-3  4. Monitor ileostomy output closely as it can dehydrate patient if output maintains elevated. Output was improved during hospitalization with dysmotility agents, fiber, and refraining from protein supplement drinks. Follow up with Dr. Gaynelle AduEric Wilson with questions  Home Health: Equipment/Devices:  Discharge Condition: stable CODE STATUS: Partial, No CPR, no Defib, okay with mechanical ventilation, meds, NIPPV  Diet recommendations: Dysphagia 2 (fine chop);Thin liquid Liquids provided via: Straw Medication Administration: Crushed with puree Supervision: Patient able to self feed;Intermittent supervision to cue for compensatory strategies Compensations: Slow rate;Small sips/bites;Clear throat intermittently;Multiple dry swallows after each bite/sip Postural Changes and/or Swallow Maneuvers: Seated upright 90 degrees   Brief/Interim Summary: 62 year old male with a complex medical history, recently discharged from the hospital on 2/2 after being treated for venous thromboembolism.  Patient was on anticoagulation with Coumadin and Lovenox bridge.  After leaving the hospital, he suddenly developed an episode of hemoptysis.  It was significant enough that EMS rerouted back to the hospital for evaluation.  Patient was referred for overnight observation.  CT scan of the chest did not show any acute findings.  Since admission, hemoptysis has resolved.  He is now producing clear sputum with cough.  Hemoglobin has been stable.  Respiratory status is stable.  He is breathing comfortably on room air.  It is recommended that he continue on  current regimen and anticoagulation.  Hemoglobin is currently stable at baseline.  Creatinine also stable at baseline.  He has chronic kidney disease stage III-IV.  Regarding his abdominal wound, this occurred during a previous hospitalization in which he had a bowel perforation and underwent exploratory laparotomy.  He is to continue on wet-to-dry dressings twice daily and follow-up with Dr. Andrey CampanileWilson.  He is on several antimotility agents to prevent high output from his ostomy.  Patient is felt stable for discharge to skilled nursing facility today.  Please refer to discharge summary done by Dr. Alvino Chapelhoi on 2/2 for further details of his many medical problems.  Discharge Diagnoses:  Principal Problem:   Cough with hemoptysis Active Problems:   Pulmonary embolism (HCC)   Hemoptysis    Discharge Instructions  Discharge Instructions    Diet - low sodium heart healthy   Complete by:  As directed    Increase activity slowly   Complete by:  As directed      Allergies as of 09/22/2017   No Known Allergies     Medication List    TAKE these medications   albuterol 108 (90 Base) MCG/ACT inhaler Commonly known as:  PROVENTIL HFA;VENTOLIN HFA Inhale 2 puffs into the lungs every 6 (six) hours as needed for wheezing or shortness of breath.   BREO ELLIPTA 100-25 MCG/INH Aepb Generic drug:  fluticasone furoate-vilanterol Inhale 1 puff into the lungs daily.   diphenoxylate-atropine 2.5-0.025 MG tablet Commonly known as:  LOMOTIL Take 2 tablets by mouth 4 (four) times daily.   enoxaparin 80 MG/0.8ML injection Commonly known as:  LOVENOX Inject 0.8 mLs (80 mg total) into the skin daily for 7 days.   loperamide 2 MG capsule Commonly known as:  IMODIUM Take 2 capsules (4 mg total) by mouth 4 (four) times daily.  psyllium 95 % Pack Commonly known as:  HYDROCIL/METAMUCIL Take 1 packet by mouth daily.   warfarin 5 MG tablet Commonly known as:  COUMADIN Take 1 tablet (5 mg total) by mouth one  time only at 6 PM.       No Known Allergies  Consultations:     Procedures/Studies: Ct Abdomen Pelvis Wo Contrast  Result Date: 09/15/2017 CLINICAL DATA:  62 year old male with a history of possible GI bleeding and ascites EXAM: CT ABDOMEN AND PELVIS WITHOUT CONTRAST TECHNIQUE: Multidetector CT imaging of the abdomen and pelvis was performed following the standard protocol without IV contrast. COMPARISON:  CT 08/22/2017, 08/17/2017 FINDINGS: Lower chest: Mixed ground-glass and nodular opacity of the right lower lobe. Small bilateral pleural effusions with associated atelectasis. Hepatobiliary: Unremarkable appearance of liver parenchyma. Hyperdense material within the gallbladder lumen, new from the comparison. Pericholecystic fluid. No intrahepatic or extrahepatic biliary ductal dilatation. Pancreas: Unremarkable appearance of pancreas Spleen: Unremarkable spleen Adrenals/Urinary Tract: Unremarkable adrenal glands. Right kidney with no hydronephrosis or nephrolithiasis. Redemonstration of cyst of the posterior right kidney cortex. No left-sided hydronephrosis or nephrolithiasis. Hyperdense focus in the posterior left kidney cortex, unchanged. Urinary catheter within the urinary bladder which is decompressed. Gas within the urinary bladder, likely secondary to manipulation. Stomach/Bowel: Gastric tube terminates within the stomach lumen. No evidence of abnormally distended small bowel or colon. Ostomy within the right abdomen again demonstrated. Surgical changes at the right lower quadrant of prior bowel resection. Evaluation of small bowel and colon wall limited by the exclusion of contrast. There are no focal loops of small bowel or colon with hyperdense material that would indicate accumulating blood products. Vascular/Lymphatic: Scattered vascular calcifications. Small lymph nodes in the base of the mesentery and periaortic nodal stations, unchanged. There is diffuse missed the mesentery and hazy  infiltration of the fat within the abdomen. Re-demonstration of infiltration of the lower chest and abdominal wall. Reproductive: Unremarkable pelvic structures. Other: Midline surgical changes. Since the prior CT, there has been interval removal of the pigtail drainage catheter of the right lower quadrant, as well as the surgical drains of the upper abdomen. Musculoskeletal: No acute displaced fracture. Multilevel degenerative changes of the thoracic and lumbar spine. Schmorl's node of the lower L1 endplate. IMPRESSION: There is no CT finding that would confirm or exclude GI hemorrhage. If there is ongoing clinical concern, a nuclear medicine tagged red bowel study may be considered. Hyperdense material within the gallbladder lumen, may reflect vicarious excretion of contrast or microlithiasis. If there is concern for acute biliary obstruction, correlation with physical exam and HIDA study may be considered. Diffuse abdominal and body wall edema/anasarca. Airspace disease of the right lower lobe, concerning for pneumonia. Interval removal of pigtail drainage catheter and surgical drains. Redemonstration of surgical changes along the midline abdomen and ostomy, status post bowel resection. Small bilateral pleural effusions. Electronically Signed   By: Gilmer Mor D.O.   On: 09/15/2017 10:13   Dg Chest 2 View  Result Date: 09/21/2017 CLINICAL DATA:  Acute onset of hemoptysis earlier this evening. Patient recently discharged from the hospital on anticoagulation for a recent pulmonary embolus. Intestinal perforation with laparotomies in early December, 2018. EXAM: CHEST  2 VIEW COMPARISON:  09/13/2017, 09/12/2017 and earlier, including CT chest 08/07/2017. FINDINGS: AP erect and lateral images were obtained. Cardiac silhouette normal in size for AP technique, unchanged. Thoracic aorta mildly tortuous, unchanged. Hilar and mediastinal contours otherwise unremarkable. Streaky opacities in the left lower lobe. Linear  atelectasis in the right  middle lobe. Lungs otherwise clear. Small bilateral pleural effusions. Degenerative changes involving the left shoulder. IMPRESSION: 1. Atelectasis and/or bronchopneumonia involving the left lower lobe. 2. Small bilateral pleural effusions. 3. Linear atelectasis in the right middle lobe. Electronically Signed   By: Hulan Saas M.D.   On: 09/21/2017 20:04   Dg Elbow Complete Right (3+view)  Result Date: 09/18/2017 CLINICAL DATA:  Right elbow pain and swelling for 3 days. No injury. EXAM: RIGHT ELBOW - COMPLETE 3+ VIEW COMPARISON:  None. FINDINGS: There is no evidence of fracture, dislocation, or joint effusion. There is no evidence of arthropathy or other focal bone abnormality. Soft tissues are unremarkable. IMPRESSION: Negative. Electronically Signed   By: Burman Nieves M.D.   On: 09/18/2017 00:22   Ct Chest Wo Contrast  Result Date: 09/21/2017 CLINICAL DATA:  Coughing up blood.  Recent ileostomy. EXAM: CT CHEST WITHOUT CONTRAST TECHNIQUE: Multidetector CT imaging of the chest was performed following the standard protocol without IV contrast. COMPARISON:  08/07/2017 FINDINGS: Cardiovascular: Evaluation of vascular structures is limited without IV contrast material. Heart size is normal. No pericardial effusion. Normal caliber thoracic aorta. Mediastinum/Nodes: No significant lymphadenopathy in the chest. Esophagus is decompressed. Lungs/Pleura: Small bilateral pleural effusions with basilar atelectasis. Appearances are similar to the previous study. No pneumothorax. Airways are patent. Upper Abdomen: No acute process is demonstrated in the visualized upper abdomen. Musculoskeletal: Degenerative changes in the spine. No destructive bone lesions. Prominent degenerative changes in the left shoulder. IMPRESSION: Small bilateral pleural effusions with basilar atelectasis, similar to previous study. Electronically Signed   By: Burman Nieves M.D.   On: 09/21/2017 21:38   Dg  Chest Portable 1 View  Result Date: 09/13/2017 CLINICAL DATA:  Respiratory failure. EXAM: PORTABLE CHEST 1 VIEW COMPARISON:  One-view chest x-ray 09/12/2017. FINDINGS: Heart size is exaggerated by low lung volumes. Patient has been extubated. The NG tube courses off the inferior border of the film. Bilateral pleural effusions and airspace disease have increased. Interstitial edema has increased. IMPRESSION: 1. Interval increase in bibasilar airspace disease and associated effusions. While this may represent atelectasis, infection is not excluded. Findings are worse on the right. 2. New bilateral edema. 3. Interval extubation. Electronically Signed   By: Marin Roberts M.D.   On: 09/13/2017 08:39   Dg Chest Portable 1 View  Result Date: 09/12/2017 CLINICAL DATA:  Respiratory failure.  Ventilator support. EXAM: PORTABLE CHEST 1 VIEW COMPARISON:  09/11/2017 FINDINGS: Endotracheal tube tip is 5 cm above the carina. Nasogastric tube enters the stomach. Mild bibasilar infiltrate in volume loss right more than left with small amount of pleural fluid on the right. IMPRESSION: Endotracheal tube and nasogastric tube well positioned. Bibasilar volume loss/infiltrate right more than left. Electronically Signed   By: Paulina Fusi M.D.   On: 09/12/2017 06:50   Portable Chest Xray  Result Date: 09/11/2017 CLINICAL DATA:  Check endotracheal tube intubation EXAM: PORTABLE CHEST 1 VIEW COMPARISON:  09/10/2017 FINDINGS: Endotracheal tube and gastric catheter are noted in satisfactory position. Cardiac shadow is stable. The lungs are well aerated bilaterally with tiny pleural effusions bilaterally. No focal infiltrate is noted. No bony abnormality is seen. IMPRESSION: Gastric catheter now in satisfactory position. The remainder of the exam is relatively stable. Electronically Signed   By: Alcide Clever M.D.   On: 09/11/2017 07:04   Portable Chest X-ray  Result Date: 09/10/2017 CLINICAL DATA:  Endotracheal tube  placement.  Post cardiac arrest. EXAM: PORTABLE CHEST 1 VIEW COMPARISON:  09/10/2017 FINDINGS: Extrinsic artifacts  are present. Endotracheal tube has been placed with tip measuring 6.4 cm above the carina. Shallow inspiration. Heart size and pulmonary vascularity are normal. Lungs are clear. No pneumothorax. No pleural effusions. Degenerative changes in the spine. IMPRESSION: Endotracheal tube tip measures 6.4 cm above the carina. Lungs are clear. Electronically Signed   By: Burman Nieves M.D.   On: 09/10/2017 22:57   Dg Chest Portable 1 View  Result Date: 09/10/2017 CLINICAL DATA:  Sudden onset shortness of breath, low O2 sats EXAM: PORTABLE CHEST 1 VIEW COMPARISON:  08/29/2017 FINDINGS: Heart and mediastinal contours are within normal limits. No focal opacities or effusions. No acute bony abnormality. IMPRESSION: No active disease. Electronically Signed   By: Charlett Nose M.D.   On: 09/10/2017 11:35   Dg Chest Port 1 View  Result Date: 08/29/2017 CLINICAL DATA:  Dyspnea EXAM: PORTABLE CHEST 1 VIEW COMPARISON:  08/17/2017 FINDINGS: Right PICC line tip: SVC. Stable appearance of scarring or atelectasis along the right lateral costophrenic angle. Improved aeration in the left retrocardiac region. The lungs remain otherwise clear. The prior transvenous pacer lead is absent. Heart size within normal limits. IMPRESSION: 1. Scarring at the right lung base. Slightly improved aeration in the left lower lobe with some minimal linear residual opacities. Electronically Signed   By: Gaylyn Rong M.D.   On: 08/29/2017 11:32   Dg Abd Portable 1v  Result Date: 09/12/2017 CLINICAL DATA:  Check nasogastric catheter placement EXAM: PORTABLE ABDOMEN - 1 VIEW COMPARISON:  09/10/2017 FINDINGS: Gastric catheter is noted within the mid stomach slightly advanced when compared with the prior exam. Right lower quadrant ostomy is noted. No obstructive changes are seen. Degenerative change of the lumbar spine is noted.  IMPRESSION: Nasogastric catheter within the stomach. Electronically Signed   By: Alcide Clever M.D.   On: 09/12/2017 21:23   Dg Abd Portable 1v  Result Date: 09/11/2017 CLINICAL DATA:  OG tube placement. EXAM: PORTABLE ABDOMEN - 1 VIEW COMPARISON:  CT abdomen and pelvis 08/22/2017.  Abdomen 08/20/2017 FINDINGS: Enteric tube tip is in the left upper quadrant consistent with location in the upper stomach. Visualized bowel loops are not distended. IMPRESSION: Enteric tube tip projects over the upper stomach. Electronically Signed   By: Burman Nieves M.D.   On: 09/11/2017 00:14   Dg Swallowing Func-speech Pathology  Result Date: 09/16/2017 Objective Swallowing Evaluation: Type of Study: MBS-Modified Barium Swallow Study  Patient Details Name: Levone Otten MRN: [id number] Date of Birth: [birth year] Today's Date: 09/16/2017 Time: SLP Start Time (ACUTE ONLY): 1409 -SLP Stop Time (ACUTE ONLY): 1439 SLP Time Calculation (min) (ACUTE ONLY): 30 min Past Medical History: Past Medical History: Diagnosis Date . Bowel obstruction (HCC)  . Pneumothorax  Past Surgical History: Past Surgical History: Procedure Laterality Date . COLOSTOMY   . I&D EXTREMITY Right 04/17/2016  Procedure: IRRIGATION AND DEBRIDEMENT RIGHT HAND;  Surgeon: Dominica Severin, MD;  Location: [facility] OR;  Service: Orthopedics;  Laterality: Right; . I&D EXTREMITY Right 04/19/2016  Procedure: REPEAT I&D RIGHT HAND;  Surgeon: Dominica Severin, MD;  Location: [facility] OR;  Service: Orthopedics;  Laterality: Right; . LAPAROTOMY N/A 07/23/2017  Procedure: EXPLORATORY LAPAROTOMY, SMALL BOWEL RESECTION;  Surgeon: Gaynelle Adu, MD;  Location: [facility] ORS;  Service: General;  Laterality: N/A; . LAPAROTOMY N/A 07/25/2017  Procedure: EXPLORATORY LAPAROSCOPY WITH ILEOCECTOMY, END ILEOSTOMY;  Surgeon: Almond Lint, MD;  Location: [facility] ORS;  Service: General;  Laterality: N/A;  PATIENT ABDOMINAL WOUND LEFT OPEN AND PACKED WITH BULKY DRESSING . LAPAROTOMY N/A 07/31/2017  Procedure:  EXPLORATORY LAPAROTOMY drainage of abdominal abcess;  Surgeon: Glenna Fellows, MD;  Location: [facility] ORS;  Service: General;  Laterality: N/A; . LEFT HEART CATH AND CORONARY ANGIOGRAPHY N/A 08/13/2017  Procedure: LEFT HEART CATH AND CORONARY ANGIOGRAPHY;  Surgeon: Dolores Patty, MD;  Location: [facility] INVASIVE CV LAB;  Service: Cardiovascular;  Laterality: N/A; HPI: Pt is a 62 y.o.malewith PMH of PE, tobacco abuse, and recent hospitalization for small bowel perforation from 07/23/2017-08/31/2017 complicated by multisystem failure. Discharged to a SNF 08/31/17, however returned 09/10/17 with DOE and chest tightness, with subsequent respiratory failure from presumed massive PE, with PEA arrest x2 (1/22). Pt intubated 1/22 to 1/24. Failed post-extubation screen. CXR (1/24) revealing interval increase in bibasilar airspace disease and associated effusions. While this may represent atelectasis, infection is not excluded. Findings are worse on the right.  Subjective: Pt positioned in chair and cooperative throughout assessment Assessment / Plan / Recommendation CHL IP CLINICAL IMPRESSIONS 09/16/2017 Clinical Impression Pt presents with mild-moderate pharyngeal dysphagia characterized by penetration/aspiration of nectar thick and thin liquids and moderate residue due to generalized weakness of swallowing musculature, reduced airway closure, and inadequate clearance through the UES. Pt's oral phase was mildly impaired by anterior mastication, premature swallow, and piecemeal swallow. Mod verbal cues to clear throat and repeat swallow were used to clear airway of penetration and some aspiration with all liquids. Chin tuck was attempted with thin liquids, but proven not effective given aspiration event. Pt reduced pharyngeal residue using spontaneous and cued multiple swallows. Residue at the UES and pyriform sinus likely impacted by reduced pharyngeal peristalsis and reduced UES opening given presence of large NG tube.  Therefore, removal of NG tube, when medically appropriate, will likely benefit swallow function. Given pt's oral phase deficits, pharyngeal residue, and reduced airway protection, recommend thin liquid and Dysphagia 2 (chopped) diet. Aspiration precautions include small/slow bites/sips, intermittent throat clear with sips of liquid, multiple swallows, and siting up-right for oral intake. Pt swallow function likely to improve with removal of NG tube and reconditioning of musculature as pt resumes diet. SLP will follow up to monitor pt's safety with diet and potential for advancement.   SLP Visit Diagnosis Dysphagia, oropharyngeal phase (R13.12) Attention and concentration deficit following -- Frontal lobe and executive function deficit following -- Impact on safety and function Moderate aspiration risk   CHL IP TREATMENT RECOMMENDATION 09/16/2017 Treatment Recommendations Therapy as outlined in treatment plan below   Prognosis 09/16/2017 Prognosis for Safe Diet Advancement Good Barriers to Reach Goals Cognitive deficits Barriers/Prognosis Comment -- CHL IP DIET RECOMMENDATION 09/16/2017 SLP Diet Recommendations Dysphagia 2 (Fine chop) solids;Thin liquid Liquid Administration via Straw Medication Administration Crushed with puree Compensations Slow rate;Small sips/bites;Multiple dry swallows after each bite/sip;Clear throat intermittently Postural Changes Seated upright at 90 degrees   CHL IP OTHER RECOMMENDATIONS 09/16/2017 Recommended Consults -- Oral Care Recommendations Oral care BID Other Recommendations --   CHL IP FOLLOW UP RECOMMENDATIONS 09/16/2017 Follow up Recommendations Skilled Nursing facility   [facility] IP FREQUENCY AND DURATION 09/16/2017 Speech Therapy Frequency (ACUTE ONLY) min 2x/week Treatment Duration 2 weeks      CHL IP ORAL PHASE 09/16/2017 Oral Phase Impaired Oral - Pudding Teaspoon -- Oral - Pudding Cup -- Oral - Honey Teaspoon -- Oral - Honey Cup -- Oral - Nectar Teaspoon -- Oral - Nectar Cup -- Oral -  Nectar Straw Piecemeal swallowing;Premature spillage Oral - Thin Teaspoon -- Oral - Thin Cup -- Oral - Thin Straw Piecemeal swallowing;Premature spillage Oral - Puree Piecemeal swallowing Oral -  Mech Soft -- Oral - Regular Piecemeal swallowing;Impaired mastication Oral - Multi-Consistency -- Oral - Pill -- Oral Phase - Comment --  CHL IP PHARYNGEAL PHASE 09/16/2017 Pharyngeal Phase Impaired Pharyngeal- Pudding Teaspoon -- Pharyngeal -- Pharyngeal- Pudding Cup -- Pharyngeal -- Pharyngeal- Honey Teaspoon -- Pharyngeal -- Pharyngeal- Honey Cup -- Pharyngeal -- Pharyngeal- Nectar Teaspoon -- Pharyngeal -- Pharyngeal- Nectar Cup -- Pharyngeal -- Pharyngeal- Nectar Straw Pharyngeal residue - cp segment;Pharyngeal residue - pyriform;Pharyngeal residue - valleculae;Reduced pharyngeal peristalsis;Penetration/Aspiration during swallow;Reduced airway/laryngeal closure;Reduced epiglottic inversion Pharyngeal Material enters airway, passes BELOW cords without attempt by patient to eject out (silent aspiration) Pharyngeal- Thin Teaspoon -- Pharyngeal -- Pharyngeal- Thin Cup -- Pharyngeal -- Pharyngeal- Thin Straw Compensatory strategies attempted (with notebox);Pharyngeal residue - cp segment;Pharyngeal residue - pyriform;Pharyngeal residue - valleculae;Reduced pharyngeal peristalsis;Penetration/Aspiration during swallow;Reduced epiglottic inversion;Reduced airway/laryngeal closure Pharyngeal Material enters airway, passes BELOW cords without attempt by patient to eject out (silent aspiration) Pharyngeal- Puree Pharyngeal residue - cp segment;Pharyngeal residue - pyriform;Pharyngeal residue - valleculae;Reduced pharyngeal peristalsis;Reduced epiglottic inversion;Reduced airway/laryngeal closure Pharyngeal -- Pharyngeal- Mechanical Soft -- Pharyngeal -- Pharyngeal- Regular Pharyngeal residue - cp segment;Pharyngeal residue - pyriform;Pharyngeal residue - valleculae;Reduced pharyngeal peristalsis;Reduced epiglottic inversion;Reduced  airway/laryngeal closure Pharyngeal -- Pharyngeal- Multi-consistency -- Pharyngeal -- Pharyngeal- Pill -- Pharyngeal -- Pharyngeal Comment --  CHL IP CERVICAL ESOPHAGEAL PHASE 09/16/2017 Cervical Esophageal Phase Impaired Pudding Teaspoon -- Pudding Cup -- Honey Teaspoon -- Honey Cup -- Nectar Teaspoon -- Nectar Cup -- Nectar Straw Reduced cricopharyngeal relaxation Thin Teaspoon -- Thin Cup -- Thin Straw Reduced cricopharyngeal relaxation Puree Reduced cricopharyngeal relaxation Mechanical Soft -- Regular Reduced cricopharyngeal relaxation Multi-consistency -- Pill -- Cervical Esophageal Comment -- No flowsheet data found. Maxcine Ham 09/16/2017, 4:15 PM  Note populated for [place] Jarrett, Student SLP Maxcine Ham, M.A. CCC-SLP [phone]             [place] Rt Upper Extrem Ltd Soft Tissue Non Vascular  Result Date: 09/18/2017 CLINICAL DATA:  Right arm swelling for 4 days. EXAM: ULTRASOUND right UPPER EXTREMITY LIMITED TECHNIQUE: Ultrasound examination of the upper extremity soft tissues was performed in the area of clinical concern. COMPARISON:  None. FINDINGS: Images obtained in the right antecubital fossa demonstrate a complex septated cystic and hyperechoic collection measuring 6.5 x 5 x 6.9 cm. No flow is demonstrated in the area on color flow Doppler imaging. Appearance is most consistent with either a hematoma or an abscess. IMPRESSION: Complex collection in the right antecubital fossa measuring up to 6.9 cm diameter. Appearance is most consistent with hematoma or abscess. Electronically Signed   By: Burman Nieves M.D.   On: 09/18/2017 00:25       Subjective: No further hemoptysis.  No shortness of breath.  No chest pain.  Discharge Exam: Vitals:   09/22/17 0833 09/22/17 0957  BP:  (!) 144/80  Pulse:  72  Resp:  18  Temp:  98 F (36.7 C)  SpO2: 98% 100%   Vitals:   09/21/17 2209 09/22/17 0500 09/22/17 0833 09/22/17 0957  BP: 124/85 131/78  (!) 144/80  Pulse: 100 98  72   Resp: (!) 21 20  18   Temp: 98.2 F (36.8 C) 98.5 F (36.9 C)  98 F (36.7 C)  TempSrc: Oral Oral  Oral  SpO2: 100% 99% 98% 100%  Weight: 71 kg (156 lb 8.4 oz)     Height: 5\' 10"  (1.778 m)       General: Pt is alert, awake, not in acute distress Cardiovascular: RRR, S1/S2 +, no  rubs, no gallops Respiratory: CTA bilaterally, no wheezing, no rhonchi Abdominal: Soft, NT, ND, bowel sounds +, ostomy in right lower quadrant, large midline abdominal wound Extremities: no edema, no cyanosis    The results of significant diagnostics from this hospitalization (including imaging, microbiology, ancillary and laboratory) are listed below for reference.     Microbiology: Recent Results (from the past 240 hour(s))  MRSA PCR Screening     Status: None   Collection Time: 09/12/17  3:29 PM  Result Value Ref Range Status   MRSA by PCR NEGATIVE NEGATIVE Final    Comment:        The GeneXpert MRSA Assay (FDA approved for NASAL specimens only), is one component of a comprehensive MRSA colonization surveillance program. It is not intended to diagnose MRSA infection nor to guide or monitor treatment for MRSA infections.      Labs: BNP (last 3 results) Recent Labs    09/10/17 1124 09/21/17 1920  BNP 189.9* 362.4*   Basic Metabolic Panel: Recent Labs  Lab 09/17/17 0438 09/18/17 0412 09/19/17 0527 09/20/17 0405 09/21/17 0351 09/21/17 1918 09/22/17 0905  NA 137 132* 136 138 138  138 134* 139  K 3.7 4.0 3.5 3.5 3.4*  3.5 3.6 3.4*  CL 102 101 99* 98* 98*  98* 93* 98*  CO2 23 19* 26 29 30  30 29 27   GLUCOSE 92 91 96 94 94  94 94 136*  BUN 60* 54* 43* 34* 26*  25* 24* 19  CREATININE 4.67* 3.91* 3.17* 2.71* 2.44*  2.37* 2.31* 2.07*  CALCIUM 7.8* 7.7* 7.7* 7.8* 8.0*  8.0* 7.8* 8.4*  PHOS 4.4 4.8* 4.8* 4.1 4.2  --   --    Liver Function Tests: Recent Labs  Lab 09/17/17 0438 09/18/17 0412 09/19/17 0527 09/20/17 0405 09/21/17 0351  AST 35  --  22  --   --   ALT 236*   --  107*  --   --   ALKPHOS 128*  --  122  --   --   BILITOT 1.0  --  0.7  --   --   PROT 6.0*  --  5.5*  --   --   ALBUMIN 1.8*  1.8* 1.8* 1.7*  1.7* 1.8* 1.8*   No results for input(s): LIPASE, AMYLASE in the last 168 hours. No results for input(s): AMMONIA in the last 168 hours. CBC: Recent Labs  Lab 09/19/17 0527 09/20/17 0405 09/21/17 0351 09/21/17 1918 09/22/17 0905  WBC 14.0* 15.9* 15.7* 15.0* 13.5*  NEUTROABS  --   --   --  10.7*  --   HGB 7.9* 7.6* 7.5* 7.8* 8.1*  HCT 24.0* 23.2* 23.2* 24.2* 25.5*  MCV 88.6 90.3 90.6 91.3 92.7  PLT 324 386 467* 519* 599*   Cardiac Enzymes: No results for input(s): CKTOTAL, CKMB, CKMBINDEX, TROPONINI in the last 168 hours. BNP: Invalid input(s): POCBNP CBG: Recent Labs  Lab 09/20/17 1552 09/20/17 2115 09/21/17 0809 09/21/17 1145 09/21/17 1655  GLUCAP 156* 95 90 99 94   D-Dimer No results for input(s): DDIMER in the last 72 hours. Hgb A1c No results for input(s): HGBA1C in the last 72 hours. Lipid Profile No results for input(s): CHOL, HDL, LDLCALC, TRIG, CHOLHDL, LDLDIRECT in the last 72 hours. Thyroid function studies No results for input(s): TSH, T4TOTAL, T3FREE, THYROIDAB in the last 72 hours.  Invalid input(s): FREET3 Anemia work up No results for input(s): VITAMINB12, FOLATE, FERRITIN, TIBC, IRON, RETICCTPCT in the last 72 hours. Urinalysis  Component Value Date/Time   COLORURINE YELLOW 09/13/2017 1408   APPEARANCEUR CLOUDY (A) 09/13/2017 1408   LABSPEC 1.009 09/13/2017 1408   PHURINE 6.0 09/13/2017 1408   GLUCOSEU 50 (A) 09/13/2017 1408   HGBUR MODERATE (A) 09/13/2017 1408   BILIRUBINUR NEGATIVE 09/13/2017 1408   KETONESUR NEGATIVE 09/13/2017 1408   PROTEINUR 30 (A) 09/13/2017 1408   NITRITE NEGATIVE 09/13/2017 1408   LEUKOCYTESUR TRACE (A) 09/13/2017 1408   Sepsis Labs Invalid input(s): PROCALCITONIN,  WBC,  LACTICIDVEN Microbiology Recent Results (from the past 240 hour(s))  MRSA PCR Screening      Status: None   Collection Time: 09/12/17  3:29 PM  Result Value Ref Range Status   MRSA by PCR NEGATIVE NEGATIVE Final    Comment:        The GeneXpert MRSA Assay (FDA approved for NASAL specimens only), is one component of a comprehensive MRSA colonization surveillance program. It is not intended to diagnose MRSA infection nor to guide or monitor treatment for MRSA infections.      Time coordinating discharge: Over 30 minutes  SIGNED:   Erick Blinks, MD  [facility] 09/22/2017, 1:29 PM Pager   If 7PM-7AM, please contact night-coverage [email or web address] Password [email or web address]

## 2017-09-22 NOTE — Progress Notes (Signed)
Notified MD Memon of K=3.4.

## 2017-09-22 NOTE — NC FL2 (Signed)
[place] MEDICAID FL2 LEVEL OF CARE SCREENING TOOL     IDENTIFICATION  Patient Name: Caleb Singh Birthdate: 02-08-1956 Sex: male Admission Date (Current Location): 09/21/2017  [facility] and [place] Number:  Producer, television/film/video and Address:  [facility], [address], [place], [place] [address]      Provider Number: [id number]  Attending Physician Name and Address:  Erick Blinks, MD  Relative Name and Phone Number:       Current Level of Care: Hospital Recommended Level of Care: Skilled Nursing Facility Prior Approval Number:    Date Approved/Denied:   PASRR Number: [id number]  Discharge Plan: SNF    Current Diagnoses: Patient Active Problem List   Diagnosis Date Noted  . Cough with hemoptysis 09/21/2017  . Hemoptysis 09/21/2017  . Cardiac arrest (HCC) 09/19/2017  . Pulmonary embolism (HCC) 09/19/2017  . 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 Malnutrition of moderate degree 09/14/2017  . Lactic acidosis 09/10/2017  . Protein-calorie malnutrition, severe 08/28/2017  . Abdominopelvic abscess (HCC)   . Postoperative intra-abdominal abscess   . DNR (do not resuscitate) discussion   . Palliative care by specialist   . Weakness generalized   . Septic shock (HCC)   . Pressure injury of skin 08/07/2017  . Tachypnea   . Intestinal perforation s/p SB resection/ileostomy 07/24/2017 & 07/25/2017 07/27/2017  . Ileostomy in place [facility]) 07/27/2017  . Acute respiratory failure (HCC)   . AKI (acute kidney injury) (HCC)   . Free intraperitoneal air 07/23/2017    Orientation RESPIRATION BLADDER Height & Weight     Self, Time, Situation, Place  Normal Continent Weight: 156 lb 8.4 oz (71 kg) Height:  5\' 10"  (177.8 cm)  BEHAVIORAL SYMPTOMS/MOOD NEUROLOGICAL BOWEL NUTRITION STATUS      Ileostomy Diet(Heart Healthy - Thin Liquid)  AMBULATORY STATUS COMMUNICATION OF NEEDS Skin   Extensive Assist Verbally PU Stage and  Appropriate Care                       Personal Care Assistance Level of Assistance  Bathing, Feeding, Dressing Bathing Assistance: Maximum assistance Feeding assistance: Independent Dressing Assistance: Maximum assistance     Functional Limitations Info  Sight, Hearing, Speech Sight Info: Adequate Hearing Info: Adequate Speech Info: Adequate    SPECIAL CARE FACTORS FREQUENCY  PT (By licensed PT), OT (By licensed OT)     PT Frequency: 5x week OT Frequency: 5x week            Contractures Contractures Info: Not present    Additional Factors Info  Code Status, Allergies, Insulin Sliding Scale Code Status Info: Partial Allergies Info: No Known Allergies   Insulin Sliding Scale Info: Novolog 3 times daily with meals       Current Medications (09/22/2017):  This is the current hospital active medication list Current Facility-Administered Medications  Medication Dose Route Frequency Provider Last Rate Last Dose  . acetaminophen (TYLENOL) tablet 650 mg  650 mg Oral Q6H PRN Rometta Emery, MD       Or  . acetaminophen (TYLENOL) suppository 650 mg  650 mg Rectal Q6H PRN Garba, Mohammad L, MD      . albuterol (PROVENTIL) (2.5 MG/3ML) 0.083% nebulizer solution 3 mL  3 mL Inhalation Q6H PRN Rometta Emery, MD      . diphenoxylate-atropine (LOMOTIL) 2.5-0.025 MG per tablet 2 tablet  2 tablet Oral QID Rometta Emery, MD   2 tablet  at 09/22/17 0930  . enoxaparin (LOVENOX) injection 80 mg  80 mg Subcutaneous Q24H Earlie LouGarba, Mohammad L, MD   80 mg at 09/22/17 0931  . fluticasone furoate-vilanterol (BREO ELLIPTA) 100-25 MCG/INH 1 puff  1 puff Inhalation Daily Rometta EmeryGarba, Mohammad L, MD   1 puff at 09/22/17 [id number]  . loperamide (IMODIUM) capsule 4 mg  4 mg Oral QID Rometta EmeryGarba, Mohammad L, MD   4 mg at 09/22/17 0931  . psyllium (HYDROCIL/METAMUCIL) packet 1 packet  1 packet Oral Daily Rometta EmeryGarba, Mohammad L, MD   1 packet at 09/22/17 0930  . senna-docusate (Senokot-S) tablet 1 tablet  1 tablet  Oral QHS PRN Earlie LouGarba, Mohammad L, MD      . warfarin (COUMADIN) tablet 5 mg  5 mg Oral ONCE-1800 Rometta EmeryGarba, Mohammad L, MD   5 mg at 09/21/17 2327  . Warfarin - Physician Dosing Inpatient   Does not apply [id number] Rometta EmeryGarba, Mohammad L, MD         Discharge Medications: Please see discharge summary for a list of discharge medications.  Relevant Imaging Results:  Relevant Lab Results:   Additional Information SSN [id number]   Macario GoldsJesse Teodora Baumgarten, [place] [phone]

## 2017-09-22 NOTE — Progress Notes (Signed)
Caleb Singh to be D/C'd Skilled nursing facility per MD order.  Discussed prescriptions and follow up appointments with the patient. Prescriptions given to patient, medication list explained in detail. Pt verbalized understanding. Called report to Caleb Singh, Charity fundraiserN.   Allergies as of 09/22/2017   No Known Allergies     Medication List    TAKE these medications   albuterol 108 (90 Base) MCG/ACT inhaler Commonly known as:  PROVENTIL HFA;VENTOLIN HFA Inhale 2 puffs into the lungs every 6 (six) hours as needed for wheezing or shortness of breath.   BREO ELLIPTA 100-25 MCG/INH Aepb Generic drug:  fluticasone furoate-vilanterol Inhale 1 puff into the lungs daily.   diphenoxylate-atropine 2.5-0.025 MG tablet Commonly known as:  LOMOTIL Take 2 tablets by mouth 4 (four) times daily.   enoxaparin 80 MG/0.8ML injection Commonly known as:  LOVENOX Inject 0.8 mLs (80 mg total) into the skin daily for 7 days.   loperamide 2 MG capsule Commonly known as:  IMODIUM Take 2 capsules (4 mg total) by mouth 4 (four) times daily.   psyllium 95 % Pack Commonly known as:  HYDROCIL/METAMUCIL Take 1 packet by mouth daily.   warfarin 5 MG tablet Commonly known as:  COUMADIN Take 1 tablet (5 mg total) by mouth one time only at 6 PM.       Vitals:   09/22/17 0833 09/22/17 0957  BP:  (!) 144/80  Pulse:  72  Resp:  18  Temp:  98 F (36.7 C)  SpO2: 98% 100%    Skin clean, dry and intact without evidence of skin break down, no evidence of skin tears noted. IV catheter discontinued intact. Site without signs and symptoms of complications. Dressing and pressure applied. Pt denies pain at this time. No complaints noted.  An After Visit Summary was printed and given to the patient. Patient escorted via WC, and D/C home via private auto.  Caleb RothmanNatalie Susanna Benge, RN [facility] Phone [phone]

## 2018-05-21 IMAGING — DX DG CHEST 1V PORT
1 series · 1 of 1 positions shown · non-contrast
Comparison: 09/10/2017

CLINICAL DATA: Endotracheal tube placement.  Post cardiac arrest.

EXAM:
PORTABLE CHEST 1 VIEW

[chest ap]
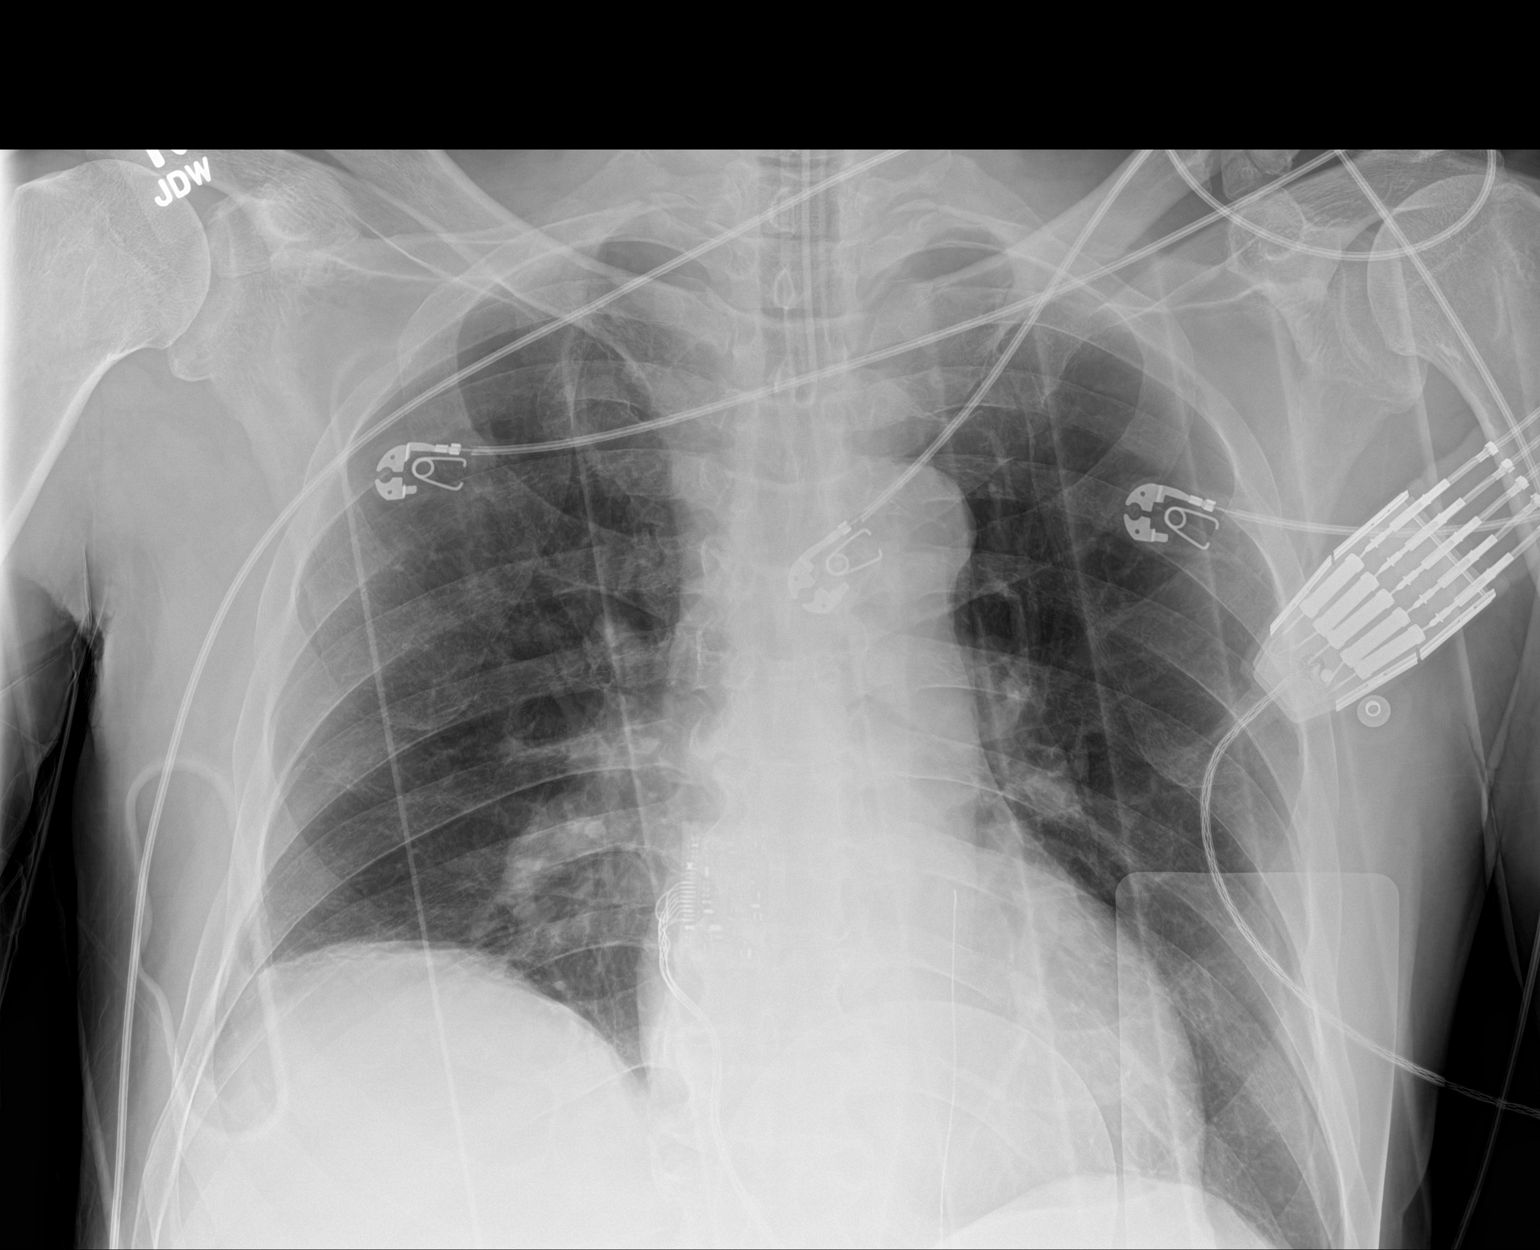

[1 of 1 positions shown; findings below may reference images not displayed]

FINDINGS: Extrinsic artifacts are present. Endotracheal tube has been placed
with tip measuring 6.4 cm above the carina. Shallow inspiration.
Heart size and pulmonary vascularity are normal. Lungs are clear. No
pneumothorax. No pleural effusions. Degenerative changes in the
spine.
IMPRESSION: Endotracheal tube tip measures 6.4 cm above the carina. Lungs are
clear.

## 2018-05-26 IMAGING — CT CT ABD-PELV W/O CM
2 of 4 series · 15 of 46 positions shown, 17 images · non-contrast
Comparison: CT 08/22/2017, 08/17/2017

CLINICAL DATA: 61-year-old male with a history of possible GI
bleeding and ascites

EXAM:
CT ABDOMEN AND PELVIS WITHOUT CONTRAST
TECHNIQUE: Multidetector CT imaging of the abdomen and pelvis was performed
following the standard protocol without IV contrast.

[Series 3: a/p w/o 5mm · axial · non-contrast · 0.69mm/px · z∈[-558,-158]mm · 12 of 92 slices shown, 14 images]
[im 8/92  soft-tissue]
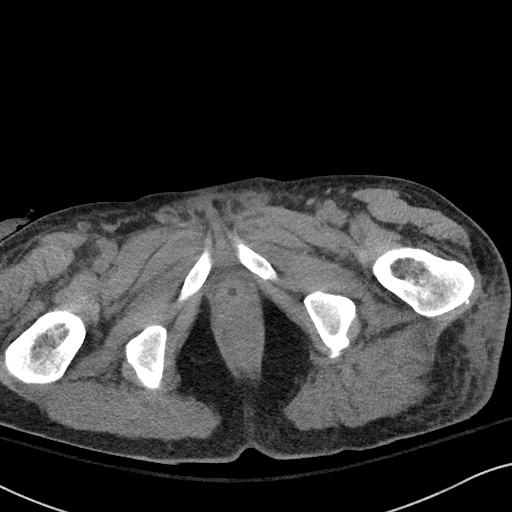
[im 8/92  bone]
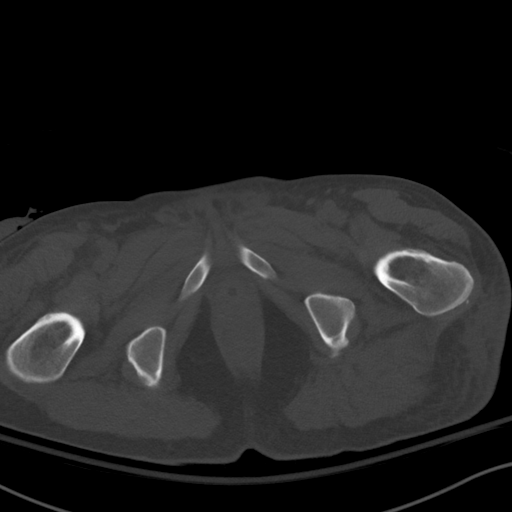
[im 15/92  soft-tissue]
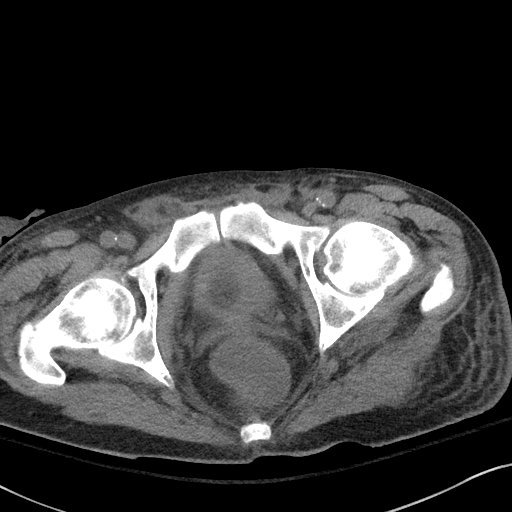
[im 22/92  soft-tissue]
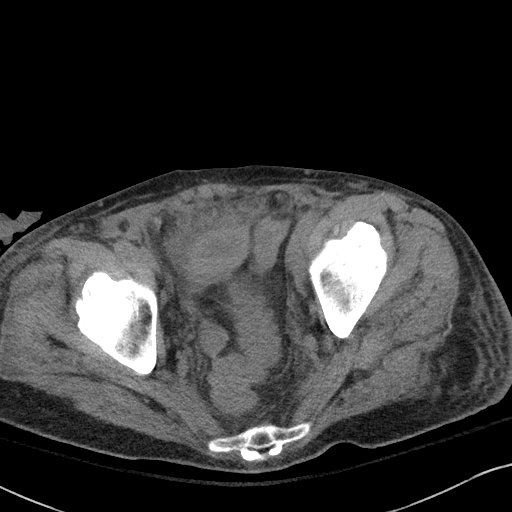
[im 30/92  soft-tissue]
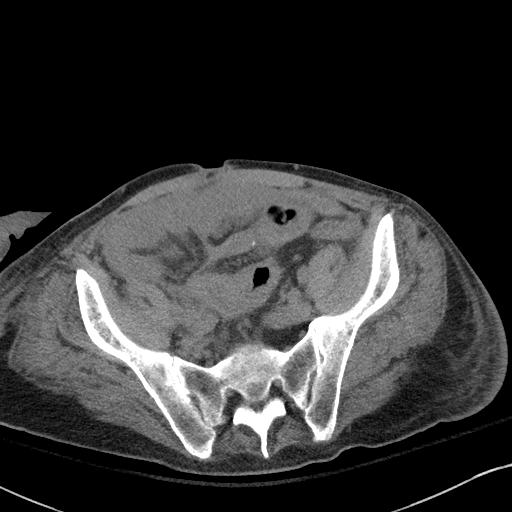
[im 37/92  soft-tissue]
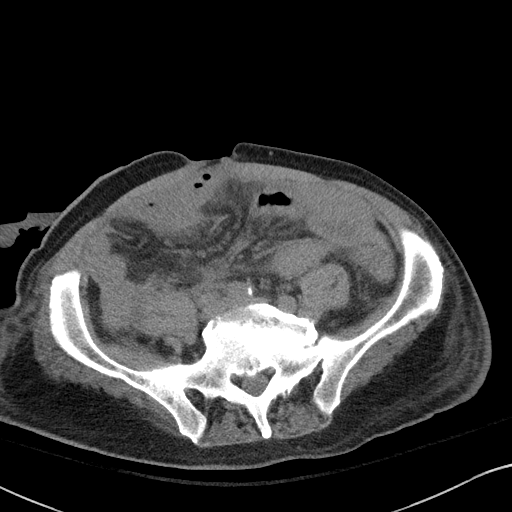
[im 44/92  soft-tissue]
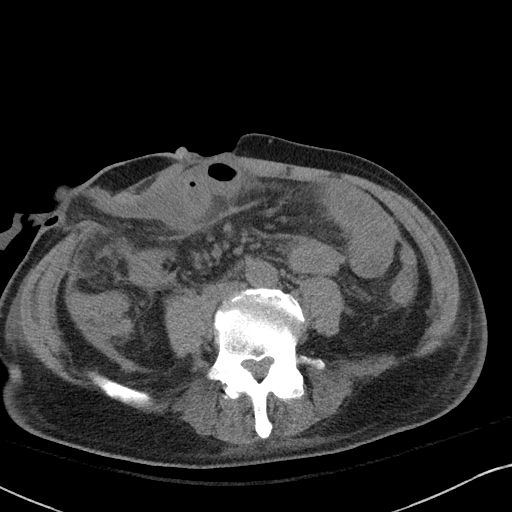
[im 51/92  soft-tissue]
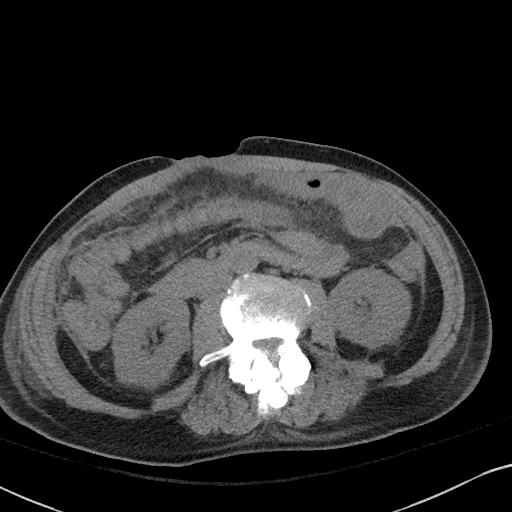
[im 59/92  soft-tissue]
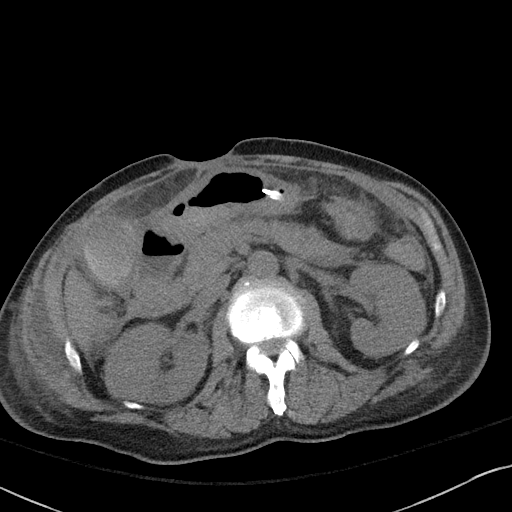
[im 66/92  soft-tissue]
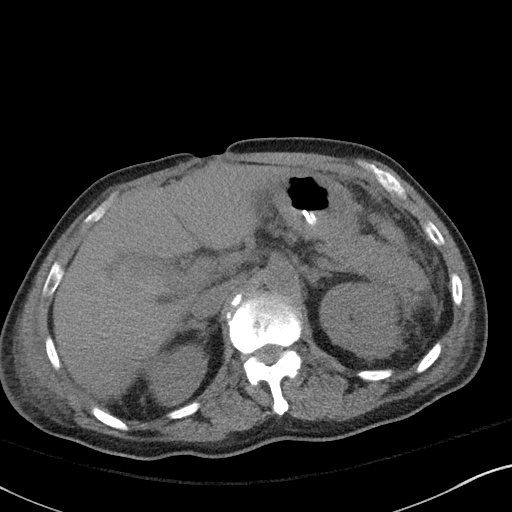
[im 66/92  bone]
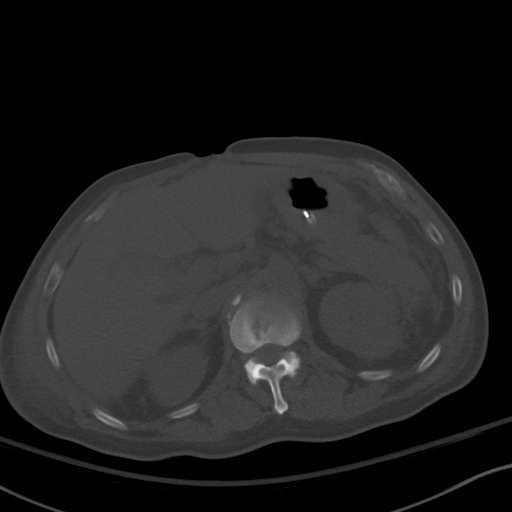
[im 73/92  soft-tissue]
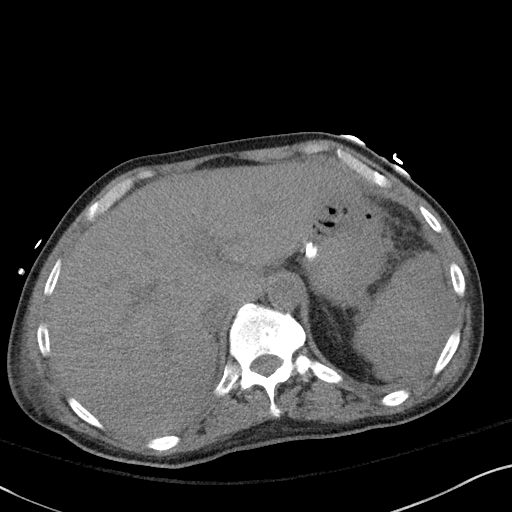
[im 81/92  soft-tissue]
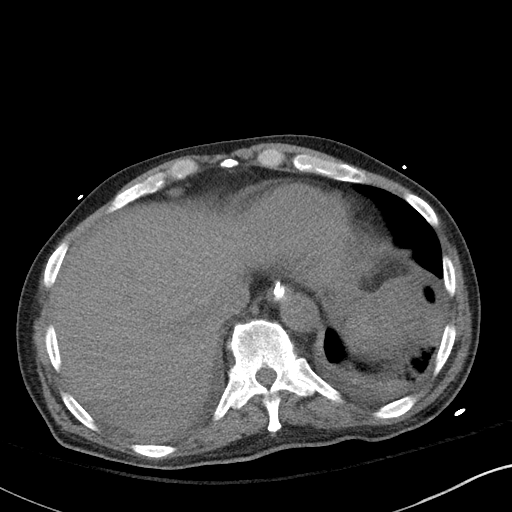
[im 88/92  soft-tissue]
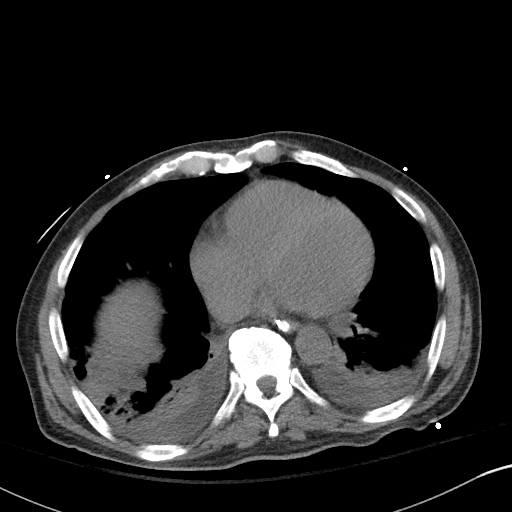

[Series 6: a/p w/o cor · coronal · non-contrast · 0.68mm/px · 3 of 149 slices shown]
[im 50/149  soft-tissue]
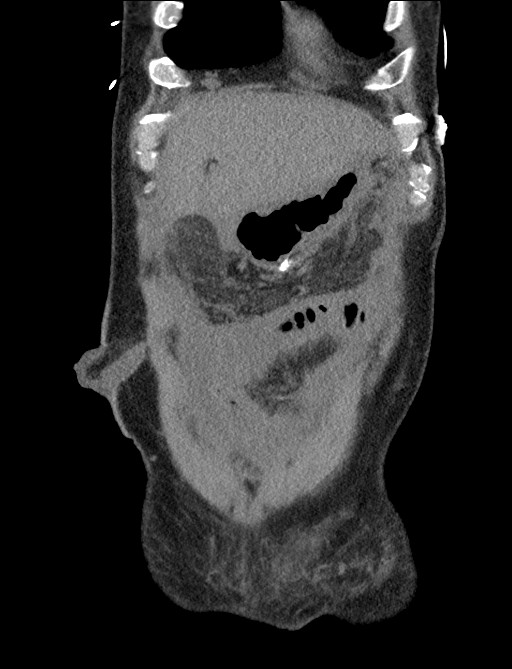
[im 66/149  soft-tissue]
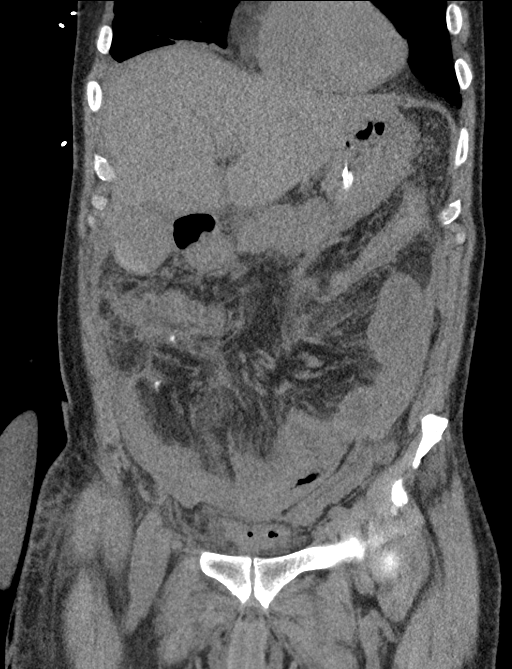
[im 83/149  soft-tissue]
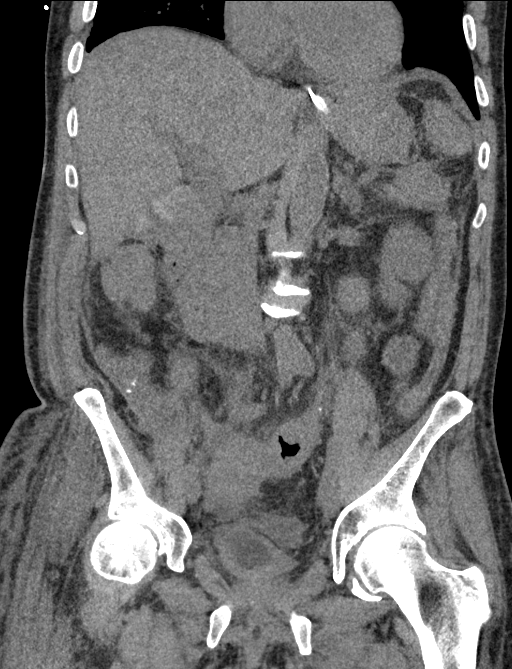

[15 of 46 positions shown; findings below may reference images not displayed]

FINDINGS: Lower chest: Mixed ground-glass and nodular opacity of the right
lower lobe. Small bilateral pleural effusions with associated
atelectasis.

Hepatobiliary: Unremarkable appearance of liver parenchyma.

Hyperdense material within the gallbladder lumen, new from the
comparison. Pericholecystic fluid. No intrahepatic or extrahepatic
biliary ductal dilatation.

Pancreas: Unremarkable appearance of pancreas

Spleen: Unremarkable spleen

Adrenals/Urinary Tract: Unremarkable adrenal glands.

Right kidney with no hydronephrosis or nephrolithiasis.
Redemonstration of cyst of the posterior right kidney cortex.

No left-sided hydronephrosis or nephrolithiasis. Hyperdense focus in
the posterior left kidney cortex, unchanged.

Urinary catheter within the urinary bladder which is decompressed.
Gas within the urinary bladder, likely secondary to manipulation.

Stomach/Bowel: Gastric tube terminates within the stomach lumen. No
evidence of abnormally distended small bowel or colon. Ostomy within
the right abdomen again demonstrated. Surgical changes at the right
lower quadrant of prior bowel resection. Evaluation of small bowel
and colon wall limited by the exclusion of contrast. There are no
focal loops of small bowel or colon with hyperdense material that
would indicate accumulating blood products.

Vascular/Lymphatic: Scattered vascular calcifications.

Small lymph nodes in the base of the mesentery and periaortic nodal
stations, unchanged.

There is diffuse missed the mesentery and hazy infiltration of the
fat within the abdomen. Re-demonstration of infiltration of the
lower chest and abdominal wall.

Reproductive: Unremarkable pelvic structures.

Other: Midline surgical changes. Since the prior CT, there has been
interval removal of the pigtail drainage catheter of the right lower
quadrant, as well as the surgical drains of the upper abdomen.

Musculoskeletal: No acute displaced fracture. Multilevel
degenerative changes of the thoracic and lumbar spine. Schmorl's
node of the lower L1 endplate.
IMPRESSION: There is no CT finding that would confirm or exclude GI hemorrhage.
If there is ongoing clinical concern, a nuclear medicine tagged red
bowel study may be considered.

Hyperdense material within the gallbladder lumen, may reflect
vicarious excretion of contrast or microlithiasis. If there is
concern for acute biliary obstruction, correlation with physical
exam and HIDA study may be considered.

Diffuse abdominal and body wall edema/anasarca.

Airspace disease of the right lower lobe, concerning for pneumonia.

Interval removal of pigtail drainage catheter and surgical drains.
Redemonstration of surgical changes along the midline abdomen and
ostomy, status post bowel resection.

Small bilateral pleural effusions.

## 2020-02-10 ENCOUNTER — Telehealth: Payer: Self-pay | Admitting: Internal Medicine

## 2020-02-10 NOTE — Telephone Encounter (Signed)
Hi Dr. Leone Payor. We have received a referral from the [place] for abd pain. Patient recently saw GI MD at the [place]. Records have been received and they will be sent to you for review. Please advise on scheduling. Thank you.

## 2020-02-11 NOTE — Telephone Encounter (Signed)
The [facility] is asking for a hydrogen breath test and not a consult.  We do not do these in the office or at the hospital but use a third party company.  So, it seems that the patient will need to go elsewhere for this so we need to explain that to him and the [place].   He may need to go to [facility] where they do these at the hospital as opposed to mail order.

## 2020-02-12 NOTE — Telephone Encounter (Signed)
Pt informed. Also, message left for Tanya at the [facility] (Pt's advocate, [phone]) informing about this. Referral will be closed and shredded.

## 2021-07-04 ENCOUNTER — Other Ambulatory Visit: Payer: Self-pay | Admitting: Family Medicine

## 2021-07-04 DIAGNOSIS — Z8719 Personal history of other diseases of the digestive system: Secondary | ICD-10-CM

## 2021-07-04 DIAGNOSIS — Z9889 Other specified postprocedural states: Secondary | ICD-10-CM

## 2021-08-03 ENCOUNTER — Ambulatory Visit
Admission: RE | Admit: 2021-08-03 | Discharge: 2021-08-03 | Disposition: A | Discharge status: Home / Self Care | Payer: Medicare Other | Source: Ambulatory Visit | Attending: Family Medicine | Admitting: Family Medicine

## 2021-08-03 DIAGNOSIS — Z8719 Personal history of other diseases of the digestive system: Secondary | ICD-10-CM

## 2021-08-03 DIAGNOSIS — Z9889 Other specified postprocedural states: Secondary | ICD-10-CM

## 2021-08-16 ENCOUNTER — Other Ambulatory Visit: Payer: Self-pay | Admitting: Family Medicine

## 2021-08-16 DIAGNOSIS — Z9889 Other specified postprocedural states: Secondary | ICD-10-CM

## 2021-08-16 DIAGNOSIS — Z8719 Personal history of other diseases of the digestive system: Secondary | ICD-10-CM
